# Patient Record
Sex: Male | Born: 1950
Health system: Southern US, Community
[De-identification: ages and names within clinical notes are randomized; demographics above are authoritative.]

## PROBLEM LIST (undated history)

## (undated) DIAGNOSIS — I509 Heart failure, unspecified: Secondary | ICD-10-CM

## (undated) DIAGNOSIS — G4730 Sleep apnea, unspecified: Secondary | ICD-10-CM

## (undated) DIAGNOSIS — Z955 Presence of coronary angioplasty implant and graft: Secondary | ICD-10-CM

## (undated) DIAGNOSIS — E7800 Pure hypercholesterolemia, unspecified: Secondary | ICD-10-CM

## (undated) DIAGNOSIS — R011 Cardiac murmur, unspecified: Secondary | ICD-10-CM

## (undated) DIAGNOSIS — B958 Unspecified staphylococcus as the cause of diseases classified elsewhere: Secondary | ICD-10-CM

## (undated) DIAGNOSIS — G629 Polyneuropathy, unspecified: Secondary | ICD-10-CM

## (undated) DIAGNOSIS — M1990 Unspecified osteoarthritis, unspecified site: Secondary | ICD-10-CM

## (undated) DIAGNOSIS — T8859XA Other complications of anesthesia, initial encounter: Secondary | ICD-10-CM

## (undated) DIAGNOSIS — I2510 Atherosclerotic heart disease of native coronary artery without angina pectoris: Secondary | ICD-10-CM

## (undated) DIAGNOSIS — H548 Legal blindness, as defined in USA: Secondary | ICD-10-CM

## (undated) DIAGNOSIS — M6530 Trigger finger, unspecified finger: Secondary | ICD-10-CM

## (undated) DIAGNOSIS — M545 Low back pain, unspecified: Secondary | ICD-10-CM

## (undated) DIAGNOSIS — I10 Essential (primary) hypertension: Secondary | ICD-10-CM

## (undated) DIAGNOSIS — Z973 Presence of spectacles and contact lenses: Secondary | ICD-10-CM

## (undated) DIAGNOSIS — IMO0002 Reserved for concepts with insufficient information to code with codable children: Secondary | ICD-10-CM

## (undated) DIAGNOSIS — T4145XA Adverse effect of unspecified anesthetic, initial encounter: Secondary | ICD-10-CM

## (undated) HISTORY — DX: Presence of coronary angioplasty implant and graft: Z95.5

## (undated) HISTORY — PX: EYE SURGERY: SHX253

## (undated) HISTORY — PX: LUMBAR FUSION: SHX111

## (undated) HISTORY — DX: Low back pain, unspecified: M54.50

## (undated) HISTORY — PX: NECK SURGERY: SHX720

## (undated) HISTORY — DX: Heart failure, unspecified: I50.9

## (undated) HISTORY — DX: Atherosclerotic heart disease of native coronary artery without angina pectoris: I25.10

## (undated) HISTORY — DX: Reserved for concepts with insufficient information to code with codable children: IMO0002

## (undated) HISTORY — DX: Essential (primary) hypertension: I10

## (undated) HISTORY — PX: INGUINAL HERNIA REPAIR: SUR1180

## (undated) HISTORY — DX: Low back pain: M54.5

## (undated) HISTORY — DX: Pure hypercholesterolemia, unspecified: E78.00

## (undated) HISTORY — PX: COLONOSCOPY: SHX174

## (undated) HISTORY — PX: OTHER SURGICAL HISTORY: SHX169

## (undated) HISTORY — DX: Polyneuropathy, unspecified: G62.9

---

## 1998-05-30 ENCOUNTER — Ambulatory Visit (HOSPITAL_COMMUNITY): Admission: RE | Admit: 1998-05-30 | Discharge: 1998-05-30 | Payer: Self-pay | Admitting: Family Medicine

## 2000-01-21 ENCOUNTER — Encounter: Payer: Self-pay | Admitting: *Deleted

## 2000-01-21 ENCOUNTER — Inpatient Hospital Stay (HOSPITAL_COMMUNITY): Admission: EM | Admit: 2000-01-21 | Discharge: 2000-01-23 | Payer: Self-pay | Admitting: Emergency Medicine

## 2000-01-27 ENCOUNTER — Encounter: Admission: RE | Admit: 2000-01-27 | Discharge: 2000-04-26 | Payer: Self-pay | Admitting: Internal Medicine

## 2000-08-13 ENCOUNTER — Ambulatory Visit (HOSPITAL_COMMUNITY): Admission: RE | Admit: 2000-08-13 | Discharge: 2000-08-13 | Payer: Self-pay | Admitting: *Deleted

## 2000-08-13 ENCOUNTER — Encounter (INDEPENDENT_AMBULATORY_CARE_PROVIDER_SITE_OTHER): Payer: Self-pay | Admitting: *Deleted

## 2001-04-02 ENCOUNTER — Encounter: Payer: Self-pay | Admitting: Internal Medicine

## 2001-04-02 ENCOUNTER — Ambulatory Visit (HOSPITAL_COMMUNITY): Admission: RE | Admit: 2001-04-02 | Discharge: 2001-04-02 | Payer: Self-pay | Admitting: Internal Medicine

## 2001-06-04 ENCOUNTER — Encounter: Admission: RE | Admit: 2001-06-04 | Discharge: 2001-06-04 | Payer: Self-pay | Admitting: Neurosurgery

## 2001-06-04 ENCOUNTER — Encounter: Payer: Self-pay | Admitting: Neurosurgery

## 2001-06-16 ENCOUNTER — Encounter: Payer: Self-pay | Admitting: Neurosurgery

## 2001-06-17 ENCOUNTER — Encounter: Payer: Self-pay | Admitting: Neurosurgery

## 2001-06-17 ENCOUNTER — Inpatient Hospital Stay (HOSPITAL_COMMUNITY): Admission: RE | Admit: 2001-06-17 | Discharge: 2001-06-18 | Payer: Self-pay | Admitting: Neurosurgery

## 2001-08-03 ENCOUNTER — Emergency Department (HOSPITAL_COMMUNITY): Admission: EM | Admit: 2001-08-03 | Discharge: 2001-08-03 | Payer: Self-pay | Admitting: Emergency Medicine

## 2001-08-06 ENCOUNTER — Encounter (INDEPENDENT_AMBULATORY_CARE_PROVIDER_SITE_OTHER): Payer: Self-pay | Admitting: *Deleted

## 2001-08-07 ENCOUNTER — Encounter: Payer: Self-pay | Admitting: Neurosurgery

## 2001-08-07 ENCOUNTER — Inpatient Hospital Stay (HOSPITAL_COMMUNITY): Admission: EM | Admit: 2001-08-07 | Discharge: 2001-08-19 | Payer: Self-pay | Admitting: Physical Therapy

## 2001-08-10 ENCOUNTER — Encounter: Payer: Self-pay | Admitting: Neurosurgery

## 2001-08-10 ENCOUNTER — Encounter: Payer: Self-pay | Admitting: Infectious Diseases

## 2001-08-11 ENCOUNTER — Encounter: Payer: Self-pay | Admitting: Neurosurgery

## 2001-08-13 ENCOUNTER — Encounter: Payer: Self-pay | Admitting: Infectious Diseases

## 2001-08-16 ENCOUNTER — Encounter: Payer: Self-pay | Admitting: Neurosurgery

## 2001-08-19 ENCOUNTER — Inpatient Hospital Stay (HOSPITAL_COMMUNITY)
Admission: RE | Admit: 2001-08-19 | Discharge: 2001-09-03 | Payer: Self-pay | Admitting: Physical Medicine & Rehabilitation

## 2001-09-02 ENCOUNTER — Encounter: Payer: Self-pay | Admitting: Physical Medicine & Rehabilitation

## 2001-09-22 ENCOUNTER — Encounter: Admission: RE | Admit: 2001-09-22 | Discharge: 2001-09-22 | Payer: Self-pay | Admitting: Infectious Diseases

## 2001-10-20 ENCOUNTER — Encounter: Admission: RE | Admit: 2001-10-20 | Discharge: 2001-10-20 | Payer: Self-pay | Admitting: Infectious Diseases

## 2001-10-21 ENCOUNTER — Encounter: Admission: RE | Admit: 2001-10-21 | Discharge: 2001-11-01 | Payer: Self-pay | Admitting: Internal Medicine

## 2001-11-10 ENCOUNTER — Encounter: Payer: Self-pay | Admitting: Internal Medicine

## 2001-11-10 ENCOUNTER — Inpatient Hospital Stay (HOSPITAL_COMMUNITY): Admission: AD | Admit: 2001-11-10 | Discharge: 2001-11-14 | Payer: Self-pay | Admitting: Internal Medicine

## 2001-12-31 ENCOUNTER — Ambulatory Visit (HOSPITAL_COMMUNITY): Admission: RE | Admit: 2001-12-31 | Discharge: 2001-12-31 | Payer: Self-pay | Admitting: Internal Medicine

## 2002-01-10 ENCOUNTER — Encounter: Admission: RE | Admit: 2002-01-10 | Discharge: 2002-02-14 | Payer: Self-pay | Admitting: Internal Medicine

## 2005-12-22 HISTORY — PX: OTHER SURGICAL HISTORY: SHX169

## 2006-05-29 ENCOUNTER — Inpatient Hospital Stay (HOSPITAL_COMMUNITY): Admission: EM | Admit: 2006-05-29 | Discharge: 2006-06-02 | Payer: Self-pay | Admitting: Emergency Medicine

## 2007-11-17 ENCOUNTER — Inpatient Hospital Stay (HOSPITAL_COMMUNITY): Admission: EM | Admit: 2007-11-17 | Discharge: 2007-11-20 | Payer: Self-pay | Admitting: Emergency Medicine

## 2008-12-19 ENCOUNTER — Ambulatory Visit: Payer: Self-pay | Admitting: Cardiology

## 2008-12-20 ENCOUNTER — Inpatient Hospital Stay (HOSPITAL_COMMUNITY): Admission: EM | Admit: 2008-12-20 | Discharge: 2008-12-21 | Payer: Self-pay | Admitting: Emergency Medicine

## 2009-03-18 ENCOUNTER — Emergency Department (HOSPITAL_COMMUNITY): Admission: EM | Admit: 2009-03-18 | Discharge: 2009-03-18 | Payer: Self-pay | Admitting: Emergency Medicine

## 2009-05-25 ENCOUNTER — Inpatient Hospital Stay (HOSPITAL_COMMUNITY): Admission: EM | Admit: 2009-05-25 | Discharge: 2009-05-26 | Payer: Self-pay | Admitting: Emergency Medicine

## 2010-06-27 LAB — HM DIABETES EYE EXAM: HM Diabetic Eye Exam: NORMAL

## 2010-08-17 ENCOUNTER — Emergency Department (HOSPITAL_COMMUNITY)
Admission: EM | Admit: 2010-08-17 | Discharge: 2010-08-17 | Payer: Self-pay | Source: Home / Self Care | Admitting: Emergency Medicine

## 2010-08-19 ENCOUNTER — Telehealth: Payer: Self-pay | Admitting: Internal Medicine

## 2010-08-28 ENCOUNTER — Ambulatory Visit: Payer: Self-pay | Admitting: Internal Medicine

## 2010-08-28 DIAGNOSIS — I509 Heart failure, unspecified: Secondary | ICD-10-CM | POA: Insufficient documentation

## 2010-08-28 DIAGNOSIS — M545 Low back pain, unspecified: Secondary | ICD-10-CM | POA: Insufficient documentation

## 2010-08-28 DIAGNOSIS — E118 Type 2 diabetes mellitus with unspecified complications: Secondary | ICD-10-CM

## 2010-08-28 DIAGNOSIS — I2510 Atherosclerotic heart disease of native coronary artery without angina pectoris: Secondary | ICD-10-CM | POA: Insufficient documentation

## 2010-08-28 DIAGNOSIS — E1165 Type 2 diabetes mellitus with hyperglycemia: Secondary | ICD-10-CM | POA: Insufficient documentation

## 2010-08-28 LAB — CONVERTED CEMR LAB
BUN: 15 mg/dL (ref 6–23)
Chloride: 105 meq/L (ref 96–112)
Cholesterol: 193 mg/dL (ref 0–200)
Creatinine, Ser: 1 mg/dL (ref 0.4–1.5)
Glucose, Bld: 108 mg/dL — ABNORMAL HIGH (ref 70–99)
HDL goal, serum: 40 mg/dL
HDL: 26.4 mg/dL — ABNORMAL LOW (ref >39.00)
LDL Goal: 70 mg/dL
Potassium: 5 meq/L (ref 3.5–5.1)

## 2010-08-29 ENCOUNTER — Encounter: Payer: Self-pay | Admitting: Internal Medicine

## 2010-08-29 ENCOUNTER — Telehealth: Payer: Self-pay | Admitting: Internal Medicine

## 2010-08-29 ENCOUNTER — Encounter (INDEPENDENT_AMBULATORY_CARE_PROVIDER_SITE_OTHER): Payer: Self-pay | Admitting: *Deleted

## 2010-09-09 ENCOUNTER — Encounter (INDEPENDENT_AMBULATORY_CARE_PROVIDER_SITE_OTHER): Payer: Self-pay | Admitting: *Deleted

## 2010-09-09 ENCOUNTER — Ambulatory Visit: Payer: Self-pay | Admitting: Gastroenterology

## 2010-09-09 ENCOUNTER — Telehealth: Payer: Self-pay | Admitting: Internal Medicine

## 2010-09-12 ENCOUNTER — Telehealth: Payer: Self-pay | Admitting: Internal Medicine

## 2010-09-20 ENCOUNTER — Encounter: Payer: Self-pay | Admitting: Internal Medicine

## 2010-10-21 ENCOUNTER — Encounter (INDEPENDENT_AMBULATORY_CARE_PROVIDER_SITE_OTHER): Payer: Self-pay | Admitting: *Deleted

## 2010-10-23 ENCOUNTER — Ambulatory Visit: Payer: Self-pay | Admitting: Gastroenterology

## 2010-10-24 ENCOUNTER — Encounter: Payer: Self-pay | Admitting: Internal Medicine

## 2010-10-28 ENCOUNTER — Ambulatory Visit: Payer: Self-pay | Admitting: Internal Medicine

## 2010-10-30 ENCOUNTER — Ambulatory Visit: Payer: Self-pay | Admitting: Gastroenterology

## 2010-10-30 LAB — HM COLONOSCOPY

## 2010-11-01 ENCOUNTER — Encounter: Payer: Self-pay | Admitting: Gastroenterology

## 2010-11-05 ENCOUNTER — Encounter: Payer: Self-pay | Admitting: Internal Medicine

## 2010-11-20 ENCOUNTER — Telehealth: Payer: Self-pay | Admitting: Internal Medicine

## 2010-11-20 ENCOUNTER — Ambulatory Visit: Payer: Self-pay | Admitting: Internal Medicine

## 2010-11-20 DIAGNOSIS — J019 Acute sinusitis, unspecified: Secondary | ICD-10-CM | POA: Insufficient documentation

## 2010-11-20 DIAGNOSIS — R252 Cramp and spasm: Secondary | ICD-10-CM | POA: Insufficient documentation

## 2010-11-20 DIAGNOSIS — E875 Hyperkalemia: Secondary | ICD-10-CM | POA: Insufficient documentation

## 2010-11-20 LAB — CONVERTED CEMR LAB
BUN: 16 mg/dL (ref 6–23)
CO2: 25 meq/L (ref 19–32)
Glucose, Bld: 223 mg/dL — ABNORMAL HIGH (ref 70–99)
Potassium: 3.9 meq/L (ref 3.5–5.1)
Sodium: 139 meq/L (ref 135–145)

## 2010-12-18 ENCOUNTER — Ambulatory Visit: Payer: Self-pay | Admitting: Internal Medicine

## 2010-12-18 DIAGNOSIS — I10 Essential (primary) hypertension: Secondary | ICD-10-CM | POA: Insufficient documentation

## 2011-01-09 ENCOUNTER — Emergency Department (HOSPITAL_COMMUNITY)
Admission: EM | Admit: 2011-01-09 | Discharge: 2011-01-09 | Payer: Self-pay | Source: Home / Self Care | Admitting: Emergency Medicine

## 2011-01-09 ENCOUNTER — Telehealth: Payer: Self-pay | Admitting: Internal Medicine

## 2011-01-13 LAB — POCT I-STAT, CHEM 8
BUN: 27 mg/dL — ABNORMAL HIGH (ref 6–23)
Calcium, Ion: 1.16 mmol/L (ref 1.12–1.32)
Chloride: 106 mEq/L (ref 96–112)
Creatinine, Ser: 1.2 mg/dL (ref 0.4–1.5)
Glucose, Bld: 119 mg/dL — ABNORMAL HIGH (ref 70–99)
HCT: 42 % (ref 39.0–52.0)
Potassium: 4.2 mEq/L (ref 3.5–5.1)

## 2011-01-13 LAB — URINALYSIS, ROUTINE W REFLEX MICROSCOPIC
Bilirubin Urine: NEGATIVE
Ketones, ur: 15 mg/dL — AB
Nitrite: NEGATIVE
Specific Gravity, Urine: 1.028 (ref 1.005–1.030)
Urine Glucose, Fasting: NEGATIVE mg/dL
pH: 6 (ref 5.0–8.0)

## 2011-01-21 NOTE — Letter (Signed)
Summary: [facility] Instructions  [facility]  [address] [place], [place] [address]   Phone: [phone]  Fax: [phone]       GASPER HOPES    16-Mar-1951    MRN: [id number]        Procedure Day Dorna Bloom:  Wednesday 10/30/2010     Arrival Time: 7:30 am      Procedure Time: 8:30 am    Location of Procedure:                    _x [facility] (4th Floor) _ _  [facility] ( Outpatient Registration) _ _  [facility] ( Short Stay on [facility])                       PREPARATION FOR COLONOSCOPY WITH MOVIPREP   Starting 5 days prior to your procedure Friday 11/4 do not eat nuts, seeds, popcorn, corn, beans, peas,  salads, or any raw vegetables.  Do not take any fiber supplements (e.g. Metamucil, Citrucel, and Benefiber).  THE DAY BEFORE YOUR PROCEDURE         DATE: Tuesday 11/8  1.  Drink clear liquids the entire day-NO SOLID FOOD  2.  Do not drink anything colored red or purple.  Avoid juices with pulp.  No orange juice.  3.  Drink at least 64 oz. (8 glasses) of fluid/clear liquids during the day to prevent dehydration and help the prep work efficiently.  CLEAR LIQUIDS INCLUDE: Water Jello Ice Popsicles Tea (sugar ok, no milk/cream) Powdered fruit flavored drinks Coffee (sugar ok, no milk/cream) Gatorade Juice: apple, white grape, white cranberry  Lemonade Clear bullion, consomm, broth Carbonated beverages (any kind) Strained chicken noodle soup Hard Candy                             4.  In the morning, mix first dose of MoviPrep solution:    Empty 1 Pouch A and 1 Pouch B into the disposable container    Add lukewarm drinking water to the top line of the container. Mix to dissolve    Refrigerate (mixed solution should be used within 24 hrs)  5.  Begin drinking the prep at 5:00 p.m. The MoviPrep container is divided by 4 marks.   Every 15 minutes drink the solution down to the next mark (approximately 8 oz) until the  full liter is complete.   6.  Follow completed prep with 16 oz of clear liquid of your choice (Nothing red or purple).  Continue to drink clear liquids until bedtime.  7.  Before going to bed, mix second dose of MoviPrep solution:    Empty 1 Pouch A and 1 Pouch B into the disposable container    Add lukewarm drinking water to the top line of the container. Mix to dissolve    Refrigerate  THE DAY OF YOUR PROCEDURE      DATE: Wednesday 11/9  Beginning at 3:30 a.m. (5 hours before procedure):         1. Every 15 minutes, drink the solution down to the next mark (approx 8 oz) until the full liter is complete.  2. Follow completed prep with 16 oz. of clear liquid of your choice.    3. You may drink clear liquids until 6:30 am (2 HOURS BEFORE PROCEDURE).   MEDICATION INSTRUCTIONS  Unless otherwise instructed, you should  take regular prescription medications with a small sip of water   as early as possible the morning of your procedure.  Diabetic patients - see separate instructions.   Additional medication instructions: Do not take Furosemide day of procedure.         OTHER INSTRUCTIONS  You will need a responsible adult at least 62 years of age to accompany you and drive you home.   This person must remain in the waiting room during your procedure.  Wear loose fitting clothing that is easily removed.  Leave jewelry and other valuables at home.  However, you may wish to bring a book to read or  an iPod/MP3 player to listen to music as you wait for your procedure to start.  Remove all body piercing jewelry and leave at home.  Total time from sign-in until discharge is approximately 2-3 hours.  You should go home directly after your procedure and rest.  You can resume normal activities the  day after your procedure.  The day of your procedure you should not:   Drive   Make legal decisions   Operate machinery   Drink alcohol   Return to work  You will receive  specific instructions about eating, activities and medications before you leave.    The above instructions have been reviewed and explained to me by   Ezra Sites RN  October 23, 2010 9:02 AM     I fully understand and can verbalize these instructions _____________________________ Date _________

## 2011-01-21 NOTE — Progress Notes (Signed)
     Follow-up for Phone Call       Follow-up by: Etta Grandchild MD,  August 29, 2010 7:35 AM    New/Updated Medications: LOVAZA 1 GM CAPS (OMEGA-3-ACID ETHYL ESTERS) 2 by mouth BID Prescriptions: LOVAZA 1 GM CAPS (OMEGA-3-ACID ETHYL ESTERS) 2 by mouth BID  #120 x 11   Entered and Authorized by:   Etta Grandchild MD   Signed by:   Etta Grandchild MD on 08/29/2010   Method used:   [facility] to        [facility]  [id number]       [address]       [place], [place]  [address]       Ph: [phone] or [phone]       Fax: [phone]   RxID:   [id number]

## 2011-01-21 NOTE — Miscellaneous (Signed)
Summary: [facility]  Clinical Lists Changes  Medications: Added new medication of MOVIPREP 100 GM  SOLR (PEG-KCL-NACL-NASULF-NA ASC-C) As per prep instructions. - Signed Rx of MOVIPREP 100 GM  SOLR (PEG-KCL-NACL-NASULF-NA ASC-C) As per prep instructions.;  #1 x 0;  Signed;  Entered by: Ezra Sites RN;  Authorized by: Rachael Fee MD;  Method used: [facility]  [address] [id number] [address] [place], [place]  [address], Ph: [phone] or [phone], Fax: [phone] Observations: Added new observation of NKA: T (10/23/2010 8:38)    Prescriptions: MOVIPREP 100 GM  SOLR (PEG-KCL-NACL-NASULF-NA ASC-C) As per prep instructions.  #1 x 0   Entered by:   Ezra Sites RN   Authorized by:   Rachael Fee MD   Signed by:   Ezra Sites RN on 10/23/2010   Method used:   [facility]        [facility]  [address] [id number]       [address]       [place], [place]  [address]       Ph: [phone] or [phone]       Fax: [phone]   RxID:   [id number]

## 2011-01-21 NOTE — Medication Information (Signed)
[facility]   Imported By: Lester Ashford 09/23/2010 09:11:03  _____________________________________________________________________  External Attachment:    Type:   Image     Comment:   External Document

## 2011-01-21 NOTE — Assessment & Plan Note (Signed)
Summary: leg pain/cd   Vital Signs:  Patient profile:   60 year old male Height:      69 inches Weight:      260 pounds BMI:     38.53 O2 Sat:      96 % on Room air Temp:     97.4 degrees F oral Pulse rate:   77 / minute Pulse rhythm:   regular Resp:     16 per minute BP sitting:   148 / 70  (left arm) Cuff size:   large  Vitals Entered By: Bill Salinas CMA (November 20, 2010 8:48 AM)  Nutrition Counseling: Patient's BMI is greater than 25 and therefore counseled on weight management options.  O2 Flow:  Room air CC: pt c/o sinus congestion since sept with mucous coming from nose (Manny Vitolo in color) with coughing/ ab, URI symptoms   Primary Care Provider:  Etta Grandchild MD  CC:  pt c/o sinus congestion since sept with mucous coming from nose (Enna Warwick in color) with coughing/ ab and URI symptoms.  History of Present Illness:  URI Symptoms      This is a 60 year old man who presents with URI symptoms.  The symptoms began 4 weeks ago.  The severity is described as moderate.  The patient reports nasal congestion, purulent nasal discharge, and productive cough, but denies sore throat, dry cough, earache, and sick contacts.  The patient denies fever, stiff neck, dyspnea, wheezing, rash, vomiting, diarrhea, use of an antipyretic, and response to antipyretic.  The patient denies itchy throat, sneezing, seasonal symptoms, headache, muscle aches, and severe fatigue.  Risk factors for Strep sinusitis include unilateral facial pain, unilateral nasal discharge, and double sickening.  The patient denies the following risk factors for Strep sinusitis: tooth pain, Strep exposure, tender adenopathy, and absence of cough.    Preventive Screening-Counseling & Management  Alcohol-Tobacco     Alcohol drinks/day: 0     Alcohol Counseling: not indicated; patient does not drink     Smoking Status: never     Tobacco Counseling: not indicated; no tobacco use  Hep-HIV-STD-Contraception     Hepatitis Risk:  no risk noted     HIV Risk: no risk noted     STD Risk: no risk noted      Sexual History:  currently monogamous.        Drug Use:  never.        Blood Transfusions:  no.    Clinical Review Panels:  Prevention   Last Colonoscopy:  DONE (10/30/2010)  Immunizations   Last Flu Vaccine:  Fluvax 3+ (10/28/2010)   Last Pneumovax:  Pneumovax (10/28/2010)  Lipid Management   Cholesterol:  193 (08/28/2010)   HDL (good cholesterol):  26.40 (08/28/2010)  Diabetes Management   HgBA1C:  8.8 (08/28/2010)   Creatinine:  1.0 (08/28/2010)   Last Dilated Eye Exam:  normal (06/27/2010)   Last Foot Exam:  yes (11/20/2010)   Last Flu Vaccine:  Fluvax 3+ (10/28/2010)   Last Pneumovax:  Pneumovax (10/28/2010)  Complete Metabolic Panel   Glucose:  108 (08/28/2010)   Sodium:  141 (08/28/2010)   Potassium:  5.0 (08/28/2010)   Chloride:  105 (08/28/2010)   CO2:  27 (08/28/2010)   BUN:  15 (08/28/2010)   Creatinine:  1.0 (08/28/2010)   Calcium:  9.8 (08/28/2010)   Medications Prior to Update: 1)  Cosopt 2-0.5 % Mg/ml Soln (Dorzolamide Hcl-Timolol Mal) .Marland Kitchen.. 1 -2 Drops in Both Eyes Twice A Day. 2)  Carvedilol 12.5 Mg Tabs (Carvedilol) .... Take 1 Tablet By Mouth Two Times A Day. 3)  Furosemide 40 Mg Tabs (Furosemide) .... Take 1 Tablet By Mouth Once A Day. 4)  Isosorbide Mononitrate Cr 60 Mg Xr24h-Tab (Isosorbide Mononitrate) .... Take 1 1/2 Tablets Once A Day. 5)  Metformin Hcl 1000 Mg Tabs (Metformin Hcl) .... Take 1 Tablet By Mouth Two Times A Day. 6)  Methazolamide 50 Mg Tabs (Methazolamide) .... Take 1 Tablet By Mouth Three Times A Day. 7)  Nitrostat  Mg Subl (Nitroglycerin) .... As Needed. 8)  Pravastatin Sodium 40 Mg Tabs (Pravastatin Sodium) .... Take 1 Tablet By Mouth Once A Day. 9)  Ranitidine Hcl 150 Mg Tabs (Ranitidine Hcl) .... Take 1 Tablet By Mouth Two Times A Day. 10)  Humalog Mix 75/25 75-25 % Susp (Insulin Lispro Prot & Lispro) .... 85 Units Every Morning and 45 Units Every  Evening. 11)  Klor-Con M20 20 Meq Cr-Tabs (Potassium Chloride Crys Cr) .... Take 1 Tablet By Mouth Once A Day. 12)  Cosopt 22.3-6.8 Mg/ml Soln (Dorzolamide Hcl-Timolol Mal) .Marland Kitchen.. 1 Drop in Right Eye Twice A Day. 13)  Lovaza 1 Gm Caps (Omega-3-Acid Ethyl Esters) .... 2 By Mouth Bid 14)  Insulin Syringe/needle 28g X 1/2" 1 Ml Misc (Insulin Syringe-Needle U-100) .... Use As Directed Up To Tid 15)  Moviprep 100 Gm  Solr (Peg-Kcl-Nacl-Nasulf-Na Asc-C) .... As Per Prep Instructions. 16)  Januvia 100 Mg Tabs (Sitagliptin Phosphate) .... One By Mouth Once Daily For Diabetes  Current Medications (verified): 1)  Cosopt 2-0.5 % Mg/ml Soln (Dorzolamide Hcl-Timolol Mal) .Marland Kitchen.. 1 -2 Drops in Both Eyes Twice A Day. 2)  Carvedilol 12.5 Mg Tabs (Carvedilol) .... Take 1 Tablet By Mouth Two Times A Day. 3)  Furosemide 40 Mg Tabs (Furosemide) .... Take 1 Tablet By Mouth Once A Day. 4)  Isosorbide Mononitrate Cr 60 Mg Xr24h-Tab (Isosorbide Mononitrate) .... Take 1 1/2 Tablets Once A Day. 5)  Methazolamide 50 Mg Tabs (Methazolamide) .... Take 1 Tablet By Mouth Three Times A Day. 6)  Nitrostat  Mg Subl (Nitroglycerin) .... As Needed. 7)  Pravastatin Sodium 40 Mg Tabs (Pravastatin Sodium) .... Take 1 Tablet By Mouth Once A Day. 8)  Ranitidine Hcl 150 Mg Tabs (Ranitidine Hcl) .... Take 1 Tablet By Mouth Two Times A Day. 9)  Humalog Mix 75/25 75-25 % Susp (Insulin Lispro Prot & Lispro) .... 85 Units Every Morning and 45 Units Every Evening. 10)  Cosopt 22.3-6.8 Mg/ml Soln (Dorzolamide Hcl-Timolol Mal) .Marland Kitchen.. 1 Drop in Right Eye Twice A Day. 11)  Lovaza 1 Gm Caps (Omega-3-Acid Ethyl Esters) .... 2 By Mouth Bid 12)  Insulin Syringe/needle 28g X 1/2" 1 Ml Misc (Insulin Syringe-Needle U-100) .... Use As Directed Up To Tid 13)  Moviprep 100 Gm  Solr (Peg-Kcl-Nacl-Nasulf-Na Asc-C) .... As Per Prep Instructions. 14)  Januvia 100 Mg Tabs (Sitagliptin Phosphate) .... One By Mouth Once Daily For Diabetes 15)  Ceftin 500 Mg Tab  (Cefuroxime Axetil) .... Take One (1) Tablet By Mouth Two (2) Times A Day X 10 Days 16)  Tussionex Pennkinetic Er 10-8 Mg/53ml Lqcr (Hydrocod Polst-Chlorphen Polst) .... 5 Ml By Mouth Two Times A Day As Needed For Cough  Allergies (verified): 1)  ! Metformin Hcl  Past History:  Past Medical History: Last updated: 09/09/2010 Glaucoma Ulcers--1970s HTN Hypercholesterolemia Diabetes mellitus, type II Congestive heart failure Coronary artery disease Low back pain elevated risk for colon cancer, family history  Past Surgical History: Last updated: 09/09/2010 Prosthetic  Cornea placement, right eye-- 2007 [facility] Lumbar fusion   Family History: Last updated: 09/09/2010 mother--breast cancer and also colon cancer  Social History: Last updated: 09/09/2010 Occupation: disabled, blind Married Never Smoked Alcohol use-no Regular exercise-no   Risk Factors: Alcohol Use: 0 (11/20/2010) Caffeine Use: yes (08/28/2010) Exercise: no (08/28/2010)  Risk Factors: Smoking Status: never (11/20/2010)  Family History: Reviewed history from 09/09/2010 and no changes required. mother--breast cancer and also colon cancer  Social History: Reviewed history from 09/09/2010 and no changes required. Occupation: disabled, blind Married Never Smoked Alcohol use-no Regular exercise-no   Review of Systems       The patient complains of weight gain.  The patient denies anorexia, fever, weight loss, chest pain, syncope, dyspnea on exertion, peripheral edema, headaches, hemoptysis, abdominal pain, suspicious skin lesions, abnormal bleeding, and enlarged lymph nodes.   MS:  Complains of muscle aches and cramps; denies joint pain, joint redness, joint swelling, loss of strength, low back pain, muscle weakness, and stiffness. Endo:  Denies cold intolerance, excessive hunger, excessive thirst, excessive urination, heat intolerance, polyuria, and weight change.  Physical Exam  General:   alert, well-developed, well-nourished, well-hydrated, and overweight-appearing.   Head:  normocephalic, atraumatic, no abnormalities observed, and no abnormalities palpated.   Eyes:  vision grossly intact and no injection.   Ears:  R ear normal and L ear normal.   Nose:  no external deformity, no nasal discharge, no mucosal pallor, no mucosal edema, no airflow obstruction, no intranasal foreign body, no mucosal friability, no active bleeding or clots, L maxillary sinus tenderness, and R maxillary sinus tenderness.   Mouth:  good dentition and pharynx pink and moist.   Neck:  supple, full ROM, no masses, no thyromegaly, no thyroid nodules or tenderness, no JVD, normal carotid upstroke, no carotid bruits, no cervical lymphadenopathy, and no neck tenderness.   Lungs:  normal respiratory effort, no intercostal retractions, no accessory muscle use, normal breath sounds, no dullness, no fremitus, no crackles, and no wheezes.   Heart:  normal rate, regular rhythm, no murmur, no gallop, no rub, and no JVD.   Abdomen:  soft, non-tender, normal bowel sounds, no distention, no masses, no guarding, no rigidity, no rebound tenderness, no abdominal hernia, no inguinal hernia, no hepatomegaly, and no splenomegaly.   Msk:  normal ROM, no joint tenderness, no joint swelling, no joint warmth, no redness over joints, no joint deformities, no joint instability, no crepitation, and no muscle atrophy.   Pulses:  R femoral decreased, R popliteal decreased, R posterior tibial decreased, L femoral decreased, L popliteal decreased, L posterior tibial decreased, and L dorsalis pedis decreased.   Extremities:  trace left pedal edema and trace right pedal edema.   Neurologic:  No cranial nerve deficits noted. Station and gait are normal. Plantar reflexes are down-going bilaterally. DTRs are symmetrical throughout. Sensory, motor and coordinative functions appear intact. Skin:  turgor normal, color normal, no rashes, no suspicious  lesions, no ecchymoses, no petechiae, no purpura, no ulcerations, and no edema.   Cervical Nodes:  no anterior cervical adenopathy and no posterior cervical adenopathy.   Axillary Nodes:  no R axillary adenopathy and no L axillary adenopathy.   Psych:  Cognition and judgment appear intact. Alert and cooperative with normal attention span and concentration. No apparent delusions, illusions, hallucinations  Diabetes Management Exam:    Foot Exam (with socks and/or shoes not present):       Sensory-Pinprick/Light touch:  Left medial foot (L-4): normal          Left dorsal foot (L-5): normal          Left lateral foot (S-1): normal          Right medial foot (L-4): normal          Right dorsal foot (L-5): normal          Right lateral foot (S-1): normal       Sensory-Monofilament:          Left foot: normal          Right foot: normal       Inspection:          Left foot: normal          Right foot: normal       Nails:          Left foot: normal          Right foot: normal   Impression & Recommendations:  Problem # 1:  HYPERKALEMIA (ICD-276.7) Assessment New  Orders: Venipuncture ([address]) TLB-BMP (Basic Metabolic Panel-BMET) (80048-METABOL) TLB-CK Total Only(Creatine Kinase/CPK) (82550-CK) TLB-A1C / Hgb A1C (Glycohemoglobin) (83036-A1C)  Problem # 2:  CRAMP IN LIMB (ICD-729.82) Assessment: New  Orders: Venipuncture ([address]) TLB-BMP (Basic Metabolic Panel-BMET) (80048-METABOL) TLB-CK Total Only(Creatine Kinase/CPK) (82550-CK) TLB-A1C / Hgb A1C (Glycohemoglobin) (83036-A1C)  Problem # 3:  DIABETES MELLITUS, TYPE II (ICD-250.00) Assessment: Deteriorated  The following medications were removed from the medication list:    Metformin Hcl 1000 Mg Tabs (Metformin hcl) .Marland Kitchen... Take 1 tablet by mouth two times a day. His updated medication list for this problem includes:    Humalog Mix 75/25 75-25 % Susp (Insulin lispro prot & lispro) .Marland KitchenMarland KitchenMarland KitchenMarland Kitchen 85 units every morning and 45 units  every evening.    Januvia 100 Mg Tabs (Sitagliptin phosphate) ..... One by mouth once daily for diabetes  Orders: Venipuncture ([address]) TLB-BMP (Basic Metabolic Panel-BMET) (80048-METABOL) TLB-CK Total Only(Creatine Kinase/CPK) (82550-CK) TLB-A1C / Hgb A1C (Glycohemoglobin) (83036-A1C)  Problem # 4:  SINUSITIS- ACUTE-NOS (ICD-461.9) Assessment: New  His updated medication list for this problem includes:    Ceftin 500 Mg Tab (Cefuroxime axetil) .Marland Kitchen... Take one (1) tablet by mouth two (2) times a day x 10 days    Tussionex Pennkinetic Er 10-8 Mg/3ml Lqcr (Hydrocod polst-chlorphen polst) .Marland KitchenMarland KitchenMarland KitchenMarland Kitchen 5 ml by mouth two times a day as needed for cough  Instructed on treatment. Call if symptoms persist or worsen.   Complete Medication List: 1)  Cosopt 2-0.5 % Mg/ml Soln (dorzolamide Hcl-timolol Mal)  .Marland Kitchen.. 1 -2 drops in both eyes twice a day. 2)  Carvedilol 12.5 Mg Tabs (Carvedilol) .... Take 1 tablet by mouth two times a day. 3)  Furosemide 40 Mg Tabs (Furosemide) .... Take 1 tablet by mouth once a day. 4)  Isosorbide Mononitrate Cr 60 Mg Xr24h-tab (Isosorbide mononitrate) .... Take 1 1/2 tablets once a day. 5)  Methazolamide 50 Mg Tabs (Methazolamide) .... Take 1 tablet by mouth three times a day. 6)  Nitrostat Mg Subl (nitroglycerin)  .... As needed. 7)  Pravastatin Sodium 40 Mg Tabs (Pravastatin sodium) .... Take 1 tablet by mouth once a day. 8)  Ranitidine Hcl 150 Mg Tabs (Ranitidine hcl) .... Take 1 tablet by mouth two times a day. 9)  Humalog Mix 75/25 75-25 % Susp (Insulin lispro prot & lispro) .... 85 units every morning and 45 units every evening. 10)  Cosopt 22.3-6.8 Mg/ml Soln (Dorzolamide hcl-timolol mal) .Marland Kitchen.. 1 drop in  right eye twice a day. 11)  Lovaza 1 Gm Caps (Omega-3-acid ethyl esters) .... 2 by mouth bid 12)  Insulin Syringe/needle 28g X 1/2" 1 Ml Misc (Insulin syringe-needle u-100) .... Use as directed up to tid 13)  Moviprep 100 Gm Solr (Peg-kcl-nacl-nasulf-na asc-c) .... As per  prep instructions. 14)  Januvia 100 Mg Tabs (Sitagliptin phosphate) .... One by mouth once daily for diabetes 15)  Ceftin 500 Mg Tab (Cefuroxime axetil) .... Take one (1) tablet by mouth two (2) times a day x 10 days 16)  Tussionex Pennkinetic Er 10-8 Mg/65ml Lqcr (Hydrocod polst-chlorphen polst) .... 5 ml by mouth two times a day as needed for cough  Patient Instructions: 1)  Please schedule a follow-up appointment in 1 month. 2)  It is important that you exercise regularly at least 20 minutes 5 times a week. If you develop chest pain, have severe difficulty breathing, or feel very tired , stop exercising immediately and seek medical attention. 3)  You need to lose weight. Consider a lower calorie diet and regular exercise.  4)  Check your blood sugars regularly. If your readings are usually above 200 or below 70 you should contact our office. 5)  It is important that your Diabetic A1c level is checked every 3 months. 6)  See your eye doctor yearly to check for diabetic eye damage. 7)  Check your feet each night for sore areas, calluses or signs of infection. 8)  Check your Blood Pressure regularly. If it is above 130/80: you should make an appointment. 9)  Take your antibiotic as prescribed until ALL of it is gone, but stop if you develop a rash or swelling and contact our office as soon as possible. 10)  Acute sinusitis symptoms for less than 10 days are not helped by antibiotics.Use warm moist compresses, and over the counter decongestants ( only as directed). Call if no improvement in 5-7 days, sooner if increasing pain, fever, or new symptoms. Prescriptions: TUSSIONEX PENNKINETIC ER 10-8 MG/5ML LQCR (HYDROCOD POLST-CHLORPHEN POLST) 5 ml by mouth two times a day as needed for cough  #4 ounces x 1   Entered and Authorized by:   Etta Grandchild MD   Signed by:   Etta Grandchild MD on 11/20/2010   Method used:   Print then Give to Patient   RxID:   [id number] CEFTIN 500 MG TAB  (CEFUROXIME AXETIL) Take one (1) tablet by mouth two (2) times a day X 10 days  #20 x 1   Entered and Authorized by:   Etta Grandchild MD   Signed by:   Etta Grandchild MD on 11/20/2010   Method used:   [facility]  [address] [id number]       [address]       [place], [place]  [address]       Ph: [phone] or [phone]       Fax: [phone]   RxID:   [id number]    Orders Added: 1)  Venipuncture [66063] 2)  TLB-BMP (Basic Metabolic Panel-BMET) [80048-METABOL] 3)  TLB-CK Total Only(Creatine Kinase/CPK) [82550-CK] 4)  TLB-A1C / Hgb A1C (Glycohemoglobin) [83036-A1C] 5)  Est. Patient Level IV [01601]

## 2011-01-21 NOTE — Progress Notes (Signed)
Summary: written rx/jones pt  Phone Note Call from Patient   Caller: Patient Summary of Call: Wife called  requesting written rx for diabetic shoes. She is requesting a call back to [phone]. Please advise on this [facility] pt Thanks Initial call taken by: Rock Nephew CMA,  September 12, 2010 2:03 PM  Follow-up for Phone Call        normally a request comes from the provider of the shoes with a form required for  medicare purposes  I would not feel comfortable writing the rx , as medicare rules are very strict on items of this type, and "proper" documentation is essential Follow-up by: Corwin Levins MD,  September 12, 2010 5:39 PM  Additional Follow-up for Phone Call Additional follow up Details #1::        Called and spoke to [name]. Advised usually form is sent from dme provider, but can hold info until MD returns on Monday. Please advise if ok to give written order for diabetic shoes. And if you have a preferred DME provider.    Additional Follow-up by: Alysia Penna,  September 13, 2010 11:43 AM    Additional Follow-up for Phone Call Additional follow up Details #2::    yes, ok for written order for diab shoes Follow-up by: Etta Grandchild MD,  September 15, 2010 6:27 PM  Additional Follow-up for Phone Call Additional follow up Details #3:: Details for Additional Follow-up Action Taken: Pt is aware. Orders are completed and being mailed to Pt. Additional Follow-up by: Alysia Penna,  September 16, 2010 2:10 PM

## 2011-01-21 NOTE — Progress Notes (Signed)
Summary: some chest pain/sob  Phone Note Call from Patient   Caller: Spouse phlynda Reason for Call: Talk to Nurse Complaint: Headache Summary of Call: pt's wife called to make a new pt appt-appt 9-27-pt now having chest pain, sob and some nausea like before he went to hosptial -pls advise [phone] Initial call taken by: Glynda Jaeger,  August 19, 2010 3:22 PM  Follow-up for Phone Call        N/A X1 Scherrie Bateman, LPN  August 19, 2010 3:29 PM busy x1 Scherrie Bateman, LPN  August 19, 2010 5:18 PM PER DR Graciela Husbands NEEDS APPT ON DOD SCHEDULE AND [facility] SAME DAY. Follow-up by: Scherrie Bateman, LPN,  August 19, 2010 6:20 PM  Additional Follow-up for Phone Call Additional follow up Details #1::        SPOKE WITH PT"S WIFE PT HAS APPT TOMMORROW WITH DR Caro Hight . Additional Follow-up by: Scherrie Bateman, LPN,  August 20, 2010 11:41 AM

## 2011-01-21 NOTE — Letter (Signed)
Summary: Previsit letter  [facility]  [address] [place], [place] [address]   Phone: [phone]  Fax: [phone]       09/09/2010 MRN: [id number]  [facility] [address] [place], [place]  [address]  Dear Mr. Coviello,  Welcome to the Gastroenterology Division at [facility].    You are scheduled to see a nurse for your pre-procedure visit on 10/23/10 at 830 am on the 3rd floor at [facility], [address]  We ask that you try to arrive at our office 15 minutes prior to your appointment time to allow for check-in.  Your nurse visit will consist of discussing your medical and surgical history, your immediate family medical history, and your medications.    Please bring a complete list of all your medications or, if you prefer, bring the medication bottles and we will list them.  We will need to be aware of both prescribed and over the counter drugs.  We will need to know exact dosage information as well.  If you are on blood thinners (Coumadin, Plavix, Aggrenox, Ticlid, etc.) please call our office today/prior to your appointment, as we need to consult with your physician about holding your medication.   Please be prepared to read and sign documents such as consent forms, a financial agreement, and acknowledgement forms.  If necessary, and with your consent, a friend or relative is welcome to sit-in on the nurse visit with you.  Please bring your insurance card so that we may make a copy of it.  If your insurance requires a referral to see a specialist, please bring your referral form from your primary care physician.  No co-pay is required for this nurse visit.     If you cannot keep your appointment, please call [phone] to cancel or reschedule prior to your appointment date.  This allows [place] the opportunity to schedule an appointment for another patient in need of care.    Thank you for choosing [facility] for your medical  needs.  We appreciate the opportunity to care for you.  Please visit [place] at our website  to learn more about our practice.                     Sincerely.                                                                                                                   The Gastroenterology Division  Appended Document: Previsit letter letter mailed

## 2011-01-21 NOTE — Letter (Signed)
Summary: [facility] [employer]/[facility]  [facility]   Imported By: Sherian Rein 11/08/2010 10:49:19  _____________________________________________________________________  External Attachment:    Type:   Image     Comment:   External Document

## 2011-01-21 NOTE — Letter (Signed)
Summary: Lipid Letter  [facility]  [address] [place], [place] [address]   Phone: [phone]  Fax: [phone]    08/29/2010  [facility] [address] [place], [place]  [address]  Dear Channing Mutters:  We have carefully reviewed your last lipid profile from  and the results are noted below with a summary of recommendations for lipid management.    Cholesterol:       193     Goal: <200   HDL "good" Cholesterol:   87.56     Goal: >40   LDL "bad" Cholesterol:   128     Goal: <70   Triglycerides:       454.0      Goal: <150 wow!        TLC Diet (Therapeutic Lifestyle Change): Saturated Fats & Transfatty acids should be kept < 7% of total calories ***Reduce Saturated Fats Polyunstaurated Fat can be up to 10% of total calories Monounsaturated Fat Fat can be up to 20% of total calories Total Fat should be no greater than 25-35% of total calories Carbohydrates should be 50-60% of total calories Protein should be approximately 15% of total calories Fiber should be at least 20-30 grams a day ***Increased fiber may help lower LDL Total Cholesterol should be < 200mg /day Consider adding plant stanol/sterols to diet (example: Benacol spread) ***A higher intake of unsaturated fat may reduce Triglycerides and Increase HDL    Adjunctive Measures (may lower LIPIDS and reduce risk of Heart Attack) include: Aerobic Exercise (20-30 minutes 3-4 times a week) Limit Alcohol Consumption Weight Reduction Aspirin 75-81 mg a day by mouth (if not allergic or contraindicated) Dietary Fiber 20-30 grams a day by mouth     Current Medications: 1)    Cosopt 2-0.5 % Mg/ml Soln (dorzolamide Hcl-timolol Mal)  .Marland Kitchen.. 1 -2 drops in both eyes twice a day. 2)    Carvedilol 12.5 Mg Tabs (Carvedilol) .... Take 1 tablet by mouth two times a day. 3)    Furosemide 40 Mg Tabs (Furosemide) .... Take 1 tablet by mouth once a day. 4)    Isosorbide Mononitrate Cr 60 Mg Xr24h-tab (Isosorbide mononitrate)  .... Take 1 1/2 tablets once a day. 5)    Metformin Hcl 1000 Mg Tabs (Metformin hcl) .... Take 1 tablet by mouth two times a day. 6)    Methazolamide 50 Mg Tabs (Methazolamide) .... Take 1 tablet by mouth three times a day. 7)    Nitrostat  Mg Subl (nitroglycerin)  .... As needed. 8)    Pravastatin Sodium 40 Mg Tabs (Pravastatin sodium) .... Take 1 tablet by mouth once a day. 9)    Ranitidine Hcl 150 Mg Tabs (Ranitidine hcl) .... Take 1 tablet by mouth two times a day. 10)    Humalog Mix 75/25 75-25 % Susp (Insulin lispro prot & lispro) .... 85 units every morning and 45 units every evening. 11)    Klor-con M20 20 Meq Cr-tabs (Potassium chloride crys cr) .... Take 1 tablet by mouth once a day. 12)    Cosopt 22.3-6.8 Mg/ml Soln (Dorzolamide hcl-timolol mal) .Marland Kitchen.. 1 drop in right eye twice a day.  If you have any questions, please call. We appreciate being able to work with you.   Sincerely,    [facility] Etta Grandchild MD  Appended Document: Lipid Letter Mailed.

## 2011-01-21 NOTE — Assessment & Plan Note (Signed)
Summary: yearly f/u / medicare/cd   Vital Signs:  Patient profile:   60 year old male Height:      69 inches Weight:      258 pounds BMI:     38.24 O2 Sat:      96 % on Room air Temp:     98.1 degrees F oral Pulse rate:   66 / minute Pulse rhythm:   regular Resp:     16 per minute BP sitting:   130 / 64  (left arm) Cuff size:   large  Vitals Entered By: Rock Nephew CMA (October 28, 2010 9:18 AM)  Nutrition Counseling: Patient's BMI is greater than 25 and therefore counseled on weight management options.  O2 Flow:  Room air CC: Patient here for yearly follow up/ would like to discuss insulin, URI symptoms Is Patient Diabetic? Yes Did you bring your meter with you today? No Pain Assessment Patient in pain? no       Does patient need assistance? Functional Status Self care Ambulation Normal   Primary Care Provider:  Etta Grandchild MD  CC:  Patient here for yearly follow up/ would like to discuss insulin and URI symptoms.  History of Present Illness:  URI Symptoms      This is a 60 year old man who presents with URI symptoms.  The symptoms began 3 weeks ago.  The severity is described as mild.  The patient reports nasal congestion, sore throat, productive cough, and sick contacts, but denies clear nasal discharge, purulent nasal discharge, dry cough, and earache.  The patient denies fever, stiff neck, dyspnea, wheezing, rash, vomiting, diarrhea, use of an antipyretic, and response to antipyretic.  The patient denies itchy throat, sneezing, headache, muscle aches, and severe fatigue.  The patient denies the following risk factors for Strep sinusitis: unilateral facial pain, unilateral nasal discharge, poor response to decongestant, double sickening, tooth pain, Strep exposure, tender adenopathy, and absence of cough.    Preventive Screening-Counseling & Management  Alcohol-Tobacco     Alcohol drinks/day: 0     Alcohol Counseling: not indicated; patient does not  drink     Smoking Status: never     Tobacco Counseling: not indicated; no tobacco use  Hep-HIV-STD-Contraception     Hepatitis Risk: no risk noted     HIV Risk: no risk noted     STD Risk: no risk noted      Sexual History:  currently monogamous.        Drug Use:  never.        Blood Transfusions:  no.    Clinical Review Panels:  Prevention   Last Colonoscopy:  Normal (10/06/2007)  Immunizations   Last Flu Vaccine:  Fluvax 3+ (10/28/2010)   Last Pneumovax:  Pneumovax (10/28/2010)  Lipid Management   Cholesterol:  193 (08/28/2010)   HDL (good cholesterol):  26.40 (08/28/2010)  Diabetes Management   HgBA1C:  8.8 (08/28/2010)   Creatinine:  1.0 (08/28/2010)   Last Dilated Eye Exam:  normal (06/27/2010)   Last Foot Exam:  yes (10/28/2010)   Last Flu Vaccine:  Fluvax 3+ (10/28/2010)   Last Pneumovax:  Pneumovax (10/28/2010)  Complete Metabolic Panel   Glucose:  108 (08/28/2010)   Sodium:  141 (08/28/2010)   Potassium:  5.0 (08/28/2010)   Chloride:  105 (08/28/2010)   CO2:  27 (08/28/2010)   BUN:  15 (08/28/2010)   Creatinine:  1.0 (08/28/2010)   Calcium:  9.8 (08/28/2010)   Medications Prior to  Update: 1)  Cosopt 2-0.5 % Mg/ml Soln (Dorzolamide Hcl-Timolol Mal) .Marland Kitchen.. 1 -2 Drops in Both Eyes Twice A Day. 2)  Carvedilol 12.5 Mg Tabs (Carvedilol) .... Take 1 Tablet By Mouth Two Times A Day. 3)  Furosemide 40 Mg Tabs (Furosemide) .... Take 1 Tablet By Mouth Once A Day. 4)  Isosorbide Mononitrate Cr 60 Mg Xr24h-Tab (Isosorbide Mononitrate) .... Take 1 1/2 Tablets Once A Day. 5)  Metformin Hcl 1000 Mg Tabs (Metformin Hcl) .... Take 1 Tablet By Mouth Two Times A Day. 6)  Methazolamide 50 Mg Tabs (Methazolamide) .... Take 1 Tablet By Mouth Three Times A Day. 7)  Nitrostat  Mg Subl (Nitroglycerin) .... As Needed. 8)  Pravastatin Sodium 40 Mg Tabs (Pravastatin Sodium) .... Take 1 Tablet By Mouth Once A Day. 9)  Ranitidine Hcl 150 Mg Tabs (Ranitidine Hcl) .... Take 1 Tablet By  Mouth Two Times A Day. 10)  Humalog Mix 75/25 75-25 % Susp (Insulin Lispro Prot & Lispro) .... 85 Units Every Morning and 45 Units Every Evening. 11)  Klor-Con M20 20 Meq Cr-Tabs (Potassium Chloride Crys Cr) .... Take 1 Tablet By Mouth Once A Day. 12)  Cosopt 22.3-6.8 Mg/ml Soln (Dorzolamide Hcl-Timolol Mal) .Marland Kitchen.. 1 Drop in Right Eye Twice A Day. 13)  Lovaza 1 Gm Caps (Omega-3-Acid Ethyl Esters) .... 2 By Mouth Bid 14)  Insulin Syringe/needle 28g X 1/2" 1 Ml Misc (Insulin Syringe-Needle U-100) .... Use As Directed Up To Tid 15)  Moviprep 100 Gm  Solr (Peg-Kcl-Nacl-Nasulf-Na Asc-C) .... As Per Prep Instructions.  Current Medications (verified): 1)  Cosopt 2-0.5 % Mg/ml Soln (Dorzolamide Hcl-Timolol Mal) .Marland Kitchen.. 1 -2 Drops in Both Eyes Twice A Day. 2)  Carvedilol 12.5 Mg Tabs (Carvedilol) .... Take 1 Tablet By Mouth Two Times A Day. 3)  Furosemide 40 Mg Tabs (Furosemide) .... Take 1 Tablet By Mouth Once A Day. 4)  Isosorbide Mononitrate Cr 60 Mg Xr24h-Tab (Isosorbide Mononitrate) .... Take 1 1/2 Tablets Once A Day. 5)  Metformin Hcl 1000 Mg Tabs (Metformin Hcl) .... Take 1 Tablet By Mouth Two Times A Day. 6)  Methazolamide 50 Mg Tabs (Methazolamide) .... Take 1 Tablet By Mouth Three Times A Day. 7)  Nitrostat  Mg Subl (Nitroglycerin) .... As Needed. 8)  Pravastatin Sodium 40 Mg Tabs (Pravastatin Sodium) .... Take 1 Tablet By Mouth Once A Day. 9)  Ranitidine Hcl 150 Mg Tabs (Ranitidine Hcl) .... Take 1 Tablet By Mouth Two Times A Day. 10)  Humalog Mix 75/25 75-25 % Susp (Insulin Lispro Prot & Lispro) .... 85 Units Every Morning and 45 Units Every Evening. 11)  Klor-Con M20 20 Meq Cr-Tabs (Potassium Chloride Crys Cr) .... Take 1 Tablet By Mouth Once A Day. 12)  Cosopt 22.3-6.8 Mg/ml Soln (Dorzolamide Hcl-Timolol Mal) .Marland Kitchen.. 1 Drop in Right Eye Twice A Day. 13)  Lovaza 1 Gm Caps (Omega-3-Acid Ethyl Esters) .... 2 By Mouth Bid 14)  Insulin Syringe/needle 28g X 1/2" 1 Ml Misc (Insulin Syringe-Needle U-100)  .... Use As Directed Up To Tid 15)  Moviprep 100 Gm  Solr (Peg-Kcl-Nacl-Nasulf-Na Asc-C) .... As Per Prep Instructions. 16)  Januvia 100 Mg Tabs (Sitagliptin Phosphate) .... One By Mouth Once Daily For Diabetes 17)  Avelox Abc Pack 400 Mg Tabs (Moxifloxacin Hcl) .... One By Mouth Once Daily For 5 Days  Allergies (verified): No Known Drug Allergies  Past History:  Past Medical History: Last updated: 09/09/2010 Glaucoma Ulcers--1970s HTN Hypercholesterolemia Diabetes mellitus, type II Congestive heart failure Coronary artery disease Low  back pain elevated risk for colon cancer, family history  Past Surgical History: Last updated: 09/09/2010 Prosthetic Cornea placement, right eye-- 2007 [facility] Lumbar fusion   Family History: Last updated: 09/09/2010 mother--breast cancer and also colon cancer  Social History: Last updated: 09/09/2010 Occupation: disabled, blind Married Never Smoked Alcohol use-no Regular exercise-no   Risk Factors: Alcohol Use: 0 (10/28/2010) Caffeine Use: yes (08/28/2010) Exercise: no (08/28/2010)  Risk Factors: Smoking Status: never (10/28/2010)  Family History: Reviewed history from 09/09/2010 and no changes required. mother--breast cancer and also colon cancer  Social History: Reviewed history from 09/09/2010 and no changes required. Occupation: disabled, blind Married Never Smoked Alcohol use-no Regular exercise-no   Review of Systems  The patient denies anorexia, fever, weight loss, weight gain, hoarseness, chest pain, syncope, dyspnea on exertion, peripheral edema, headaches, hemoptysis, abdominal pain, hematuria, suspicious skin lesions, enlarged lymph nodes, and angioedema.   CV:  Denies chest pain or discomfort, difficulty breathing at night, fainting, fatigue, leg cramps with exertion, lightheadness, near fainting, palpitations, shortness of breath with exertion, swelling of feet, and weight gain.  Physical  Exam  General:  alert, well-developed, well-nourished, well-hydrated, and overweight-appearing.   Head:  normocephalic, atraumatic, no abnormalities observed, and no abnormalities palpated.   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   Neck:  supple, full ROM, no masses, no thyromegaly, no thyroid nodules or tenderness, no JVD, normal carotid upstroke, no carotid bruits, no cervical lymphadenopathy, and no neck tenderness.   Lungs:  normal respiratory effort, no intercostal retractions, no accessory muscle use, normal breath sounds, no dullness, no fremitus, no crackles, and no wheezes.   Heart:  normal rate, regular rhythm, no murmur, no gallop, no rub, and no JVD.   Abdomen:  soft, non-tender, normal bowel sounds, no distention, no masses, no guarding, no rigidity, no rebound tenderness, no abdominal hernia, no inguinal hernia, no hepatomegaly, and no splenomegaly.   Msk:  normal ROM, no joint tenderness, no joint swelling, no joint warmth, no redness over joints, no joint deformities, no joint instability, no crepitation, and no muscle atrophy.   Pulses:  R femoral decreased, R popliteal decreased, R posterior tibial decreased, L femoral decreased, L popliteal decreased, L posterior tibial decreased, and L dorsalis pedis decreased.   Extremities:  trace left pedal edema and trace right pedal edema.   Neurologic:  No cranial nerve deficits noted. Station and gait are normal. Plantar reflexes are down-going bilaterally. DTRs are symmetrical throughout. Sensory, motor and coordinative functions appear intact. Skin:  turgor normal, color normal, no rashes, no suspicious lesions, no ecchymoses, no petechiae, no purpura, no ulcerations, and no edema.   Cervical Nodes:  no anterior cervical adenopathy and no posterior cervical adenopathy.   Axillary Nodes:  no R axillary adenopathy and no L axillary adenopathy.   Psych:  Cognition and  judgment appear intact. Alert and cooperative with normal attention span and concentration. No apparent delusions, illusions, hallucinations  Diabetes Management Exam:    Foot Exam (with socks and/or shoes not present):       Sensory-Pinprick/Light touch:          Left medial foot (L-4): normal          Left dorsal foot (L-5): normal          Left lateral foot (S-1): normal          Right medial foot (L-4): normal  Right dorsal foot (L-5): normal          Right lateral foot (S-1): normal       Sensory-Monofilament:          Left foot: normal          Right foot: normal       Inspection:          Left foot: normal          Right foot: normal       Nails:          Left foot: normal          Right foot: normal   Impression & Recommendations:  Problem # 1:  DIABETES MELLITUS, TYPE II (ICD-250.00) Assessment Deteriorated  His updated medication list for this problem includes:    Metformin Hcl 1000 Mg Tabs (Metformin hcl) .Marland Kitchen... Take 1 tablet by mouth two times a day.    Humalog Mix 75/25 75-25 % Susp (Insulin lispro prot & lispro) .Marland KitchenMarland KitchenMarland KitchenMarland Kitchen 85 units every morning and 45 units every evening.    Januvia 100 Mg Tabs (Sitagliptin phosphate) ..... One by mouth once daily for diabetes  Labs Reviewed: Creat: 1.0 (08/28/2010)     Last Eye Exam: normal (06/27/2010) Reviewed HgBA1c results: 8.8 (08/28/2010)  Problem # 2:  COUGH (ICD-786.2) Assessment: New will check for pna, edema, effusions, masses, etc Orders: T-2 View CXR (71020TC)  Problem # 3:  BRONCHITIS-ACUTE (ICD-466.0) Assessment: New  His updated medication list for this problem includes:    Avelox Abc Pack 400 Mg Tabs (Moxifloxacin hcl) ..... One by mouth once daily for 5 days  Take antibiotics and other medications as directed. Encouraged to push clear liquids, get enough rest, and take acetaminophen as needed. To be seen in 5-7 days if no improvement, sooner if worse.  Problem # 4:  CONGESTIVE HEART FAILURE  (ICD-428.0) Assessment: Unchanged  His updated medication list for this problem includes:    Carvedilol 12.5 Mg Tabs (Carvedilol) .Marland Kitchen... Take 1 tablet by mouth two times a day.    Furosemide 40 Mg Tabs (Furosemide) .Marland Kitchen... Take 1 tablet by mouth once a day.    Methazolamide 50 Mg Tabs (Methazolamide) .Marland Kitchen... Take 1 tablet by mouth three times a day.  Complete Medication List: 1)  Cosopt 2-0.5 % Mg/ml Soln (dorzolamide Hcl-timolol Mal)  .Marland Kitchen.. 1 -2 drops in both eyes twice a day. 2)  Carvedilol 12.5 Mg Tabs (Carvedilol) .... Take 1 tablet by mouth two times a day. 3)  Furosemide 40 Mg Tabs (Furosemide) .... Take 1 tablet by mouth once a day. 4)  Isosorbide Mononitrate Cr 60 Mg Xr24h-tab (Isosorbide mononitrate) .... Take 1 1/2 tablets once a day. 5)  Metformin Hcl 1000 Mg Tabs (Metformin hcl) .... Take 1 tablet by mouth two times a day. 6)  Methazolamide 50 Mg Tabs (Methazolamide) .... Take 1 tablet by mouth three times a day. 7)  Nitrostat Mg Subl (nitroglycerin)  .... As needed. 8)  Pravastatin Sodium 40 Mg Tabs (Pravastatin sodium) .... Take 1 tablet by mouth once a day. 9)  Ranitidine Hcl 150 Mg Tabs (Ranitidine hcl) .... Take 1 tablet by mouth two times a day. 10)  Humalog Mix 75/25 75-25 % Susp (Insulin lispro prot & lispro) .... 85 units every morning and 45 units every evening. 11)  Klor-con M20 20 Meq Cr-tabs (Potassium chloride crys cr) .... Take 1 tablet by mouth once a day. 12)  Cosopt 22.3-6.8 Mg/ml Soln (Dorzolamide hcl-timolol mal) .Marland Kitchen.. 1 drop  in right eye twice a day. 13)  Lovaza 1 Gm Caps (Omega-3-acid ethyl esters) .... 2 by mouth bid 14)  Insulin Syringe/needle 28g X 1/2" 1 Ml Misc (Insulin syringe-needle u-100) .... Use as directed up to tid 15)  Moviprep 100 Gm Solr (Peg-kcl-nacl-nasulf-na asc-c) .... As per prep instructions. 16)  Januvia 100 Mg Tabs (Sitagliptin phosphate) .... One by mouth once daily for diabetes 17)  Avelox Abc Pack 400 Mg Tabs (Moxifloxacin hcl) .... One by  mouth once daily for 5 days  Other Orders: Flu Vaccine 14yrs + MEDICARE PATIENTS (W1191) Administration Flu vaccine - MCR (G0008) Pneumococcal Vaccine (47829) Admin 1st Vaccine (56213)  Patient Instructions: 1)  Please schedule a follow-up appointment in 1 month. 2)  It is important that you exercise regularly at least 20 minutes 5 times a week. If you develop chest pain, have severe difficulty breathing, or feel very tired , stop exercising immediately and seek medical attention. 3)  You need to lose weight. Consider a lower calorie diet and regular exercise.  4)  Check your blood sugars regularly. If your readings are usually above 200 or below 70 you should contact our office. 5)  It is important that your Diabetic A1c level is checked every 3 months. 6)  See your eye doctor yearly to check for diabetic eye damage. 7)  Check your feet each night for sore areas, calluses or signs of infection. 8)  Check your Blood Pressure regularly. If it is above 130/80: you should make an appointment. 9)  Take your antibiotic as prescribed until ALL of it is gone, but stop if you develop a rash or swelling and contact our office as soon as possible. 10)  Acute bronchitis symptoms for less than 10 days are not helped by antibiotics. take over the counter cough medications. call if no improvment in  5-7 days, sooner if increasing cough, fever, or new symptoms( shortness of breath, chest pain). Prescriptions: AVELOX ABC PACK 400 MG TABS (MOXIFLOXACIN HCL) One by mouth once daily for 5 days  #5 x 0   Entered and Authorized by:   Etta Grandchild MD   Signed by:   Etta Grandchild MD on 10/28/2010   Method used:   Samples Given   RxID:   [id number] JANUVIA 100 MG TABS (SITAGLIPTIN PHOSPHATE) One by mouth once daily for diabetes  #210 x 0   Entered and Authorized by:   Etta Grandchild MD   Signed by:   Etta Grandchild MD on 10/28/2010   Method used:   Samples Given   RxID:    [id number]    Orders Added: 1)  Flu Vaccine 65yrs + MEDICARE PATIENTS [Q2039] 2)  Administration Flu vaccine - MCR [G0008] 3)  Pneumococcal Vaccine [90732] 4)  Admin 1st Vaccine [90471] 5)  T-2 View CXR [71020TC] 6)  Est. Patient Level IV [40347]   Immunizations Administered:  Pneumonia Vaccine:    Vaccine Type: Pneumovax    Site: right deltoid    Mfr: Merck    Dose: 0.5 ml    Route: IM    Given by: Rock Nephew CMA    Exp. Date: 04/17/2012    Lot #: [id number]    VIS given: 11/26/09 version given October 28, 2010.   Immunizations Administered:  Pneumonia Vaccine:    Vaccine Type: Pneumovax    Site: right deltoid    Mfr: Merck    Dose: 0.5 ml    Route: IM    Given  by: Rock Nephew CMA    Exp. Date: 04/17/2012    Lot #: [id number]    VIS given: 11/26/09 version given October 28, 2010. Marland [email or web address] Flu Vaccine Consent Questions     Do you have a history of severe allergic reactions to this vaccine? no    Any prior history of allergic reactions to egg and/or gelatin? no    Do you have a sensitivity to the preservative Thimersol? no    Do you have a past history of Guillan-Barre Syndrome? no    Do you currently have an acute febrile illness? no    Have you ever had a severe reaction to latex? no    Vaccine information given and explained to patient? yes    Are you currently pregnant? no    Lot Number:[id number]   Exp Date:06/21/2011   Site Given  Left Deltoid IM

## 2011-01-21 NOTE — Letter (Signed)
Summary: New Patient letter  [facility]  [address] [place], [place] [address]   Phone: [phone]  Fax: [phone]       08/29/2010 MRN: [id number]  [facility] [address] [place], [place]  [address]  Dear [name],  Welcome to the Gastroenterology Division at [facility].    You are scheduled to see Dr.  Rob Bunting on September 09, 2010 at 9:15am on the 3rd floor at [facility], [address]  We ask that you try to arrive at our office 15 minutes prior to your appointment time to allow for check-in.  We would like you to complete the enclosed self-administered evaluation form prior to your visit and bring it with you on the day of your appointment.  We will review it with you.  Also, please bring a complete list of all your medications or, if you prefer, bring the medication bottles and we will list them.  Please bring your insurance card so that we may make a copy of it.  If your insurance requires a referral to see a specialist, please bring your referral form from your primary care physician.  Co-payments are due at the time of your visit and may be paid by cash, check or credit card.     Your office visit will consist of a consult with your physician (includes a physical exam), any laboratory testing he/she may order, scheduling of any necessary diagnostic testing (e.g. x-ray, ultrasound, CT-scan), and scheduling of a procedure (e.g. Endoscopy, Colonoscopy) if required.  Please allow enough time on your schedule to allow for any/all of these possibilities.    If you cannot keep your appointment, please call [phone] to cancel or reschedule prior to your appointment date.  This allows [place] the opportunity to schedule an appointment for another patient in need of care.  If you do not cancel or reschedule by 5 p.m. the business day prior to your appointment date, you will be charged a $50.00 late cancellation/no-show fee.    Thank  you for choosing [facility] for your medical needs.  We appreciate the opportunity to care for you.  Please visit [place] at our website  to learn more about our practice.                     Sincerely,                                                             The Gastroenterology Division

## 2011-01-21 NOTE — Assessment & Plan Note (Signed)
History of Present Illness Visit Type: Initial Consult Primary GI MD: Rob Bunting MD Primary Provider: Etta Grandchild MD Requesting Provider: Etta Grandchild MD Chief Complaint: Consult Colonoscopy, pt is blind and has assistence with him History of Present Illness:     very pleasant 60 year old man who is blind, here with his wife. He has had previous colonoscopies.  He thinks he had one in the past 4-5 years.  No polyps were noted.  He does not recall who did the procedure, exactly when or where.   I found a colonoscopy report from Dr. Danise Edge dated 2001.  D.: Was completely normal and he was recommended to have a repeat colonoscopy in 5 years do to family history of colon cancer. He believes he did have another colonoscopy but isn't sure exactly who did, when it was done, where was done.  unexplained 20-30 pound weight loss in the past year.  No changes in bowel habits.             Current Medications (verified): 1)  Cosopt 2-0.5 % Mg/ml Soln (Dorzolamide Hcl-Timolol Mal) .Marland Kitchen.. 1 -2 Drops in Both Eyes Twice A Day. 2)  Carvedilol 12.5 Mg Tabs (Carvedilol) .... Take 1 Tablet By Mouth Two Times A Day. 3)  Furosemide 40 Mg Tabs (Furosemide) .... Take 1 Tablet By Mouth Once A Day. 4)  Isosorbide Mononitrate Cr 60 Mg Xr24h-Tab (Isosorbide Mononitrate) .... Take 1 1/2 Tablets Once A Day. 5)  Metformin Hcl 1000 Mg Tabs (Metformin Hcl) .... Take 1 Tablet By Mouth Two Times A Day. 6)  Methazolamide 50 Mg Tabs (Methazolamide) .... Take 1 Tablet By Mouth Three Times A Day. 7)  Nitrostat  Mg Subl (Nitroglycerin) .... As Needed. 8)  Pravastatin Sodium 40 Mg Tabs (Pravastatin Sodium) .... Take 1 Tablet By Mouth Once A Day. 9)  Ranitidine Hcl 150 Mg Tabs (Ranitidine Hcl) .... Take 1 Tablet By Mouth Two Times A Day. 10)  Humalog Mix 75/25 75-25 % Susp (Insulin Lispro Prot & Lispro) .... 85 Units Every Morning and 45 Units Every Evening. 11)  Klor-Con M20 20 Meq Cr-Tabs (Potassium  Chloride Crys Cr) .... Take 1 Tablet By Mouth Once A Day. 12)  Cosopt 22.3-6.8 Mg/ml Soln (Dorzolamide Hcl-Timolol Mal) .Marland Kitchen.. 1 Drop in Right Eye Twice A Day. 13)  Lovaza 1 Gm Caps (Omega-3-Acid Ethyl Esters) .... 2 By Mouth Bid  Allergies (verified): No Known Drug Allergies  Past History:  Past Medical History: Glaucoma Ulcers--1970s HTN Hypercholesterolemia Diabetes mellitus, type II Congestive heart failure Coronary artery disease Low back pain elevated risk for colon cancer, family history  Past Surgical History: Prosthetic Cornea placement, right eye-- 2007 [facility] Lumbar fusion   Family History: mother--breast cancer and also colon cancer  Social History: Occupation: disabled, blind Married Never Smoked Alcohol use-no Regular exercise-no   Review of Systems       Pertinent positive and negative review of systems were noted in the above HPI and GI specific review of systems.  All other review of systems was otherwise negative.   Vital Signs:  Patient profile:   60 year old male Height:      69 inches Weight:      262 pounds BMI:     38.83 BSA:     2.32 Pulse rate:   78 / minute Pulse rhythm:   regular BP sitting:   142 / 78  (left arm)  Vitals Entered By: Merri Ray CMA Duncan Dull) (September 09, 2010 9:26 AM)  Physical Exam  Additional Exam:  Constitutional: Blind, obese Psychiatric: alert and oriented times 3 Eyes:glaucomatous Mouth: oropharynx moist, no lesions Neck: supple, no lymphadenopathy Cardiovascular: heart regular rate and rythm Lungs: CTA bilaterally Abdomen: soft, non-tender, non-distended, no obvious ascites, no peritoneal signs, normal bowel sounds Extremities: no lower extremity edema bilaterally Skin: no lesions on visible extremities    Impression & Recommendations:  Problem # 1:  elevated risk for colon cancer, family history we will try to track down his most recent colonoscopy which they believe was 4-5 years ago.  He definitely had one in 2001 by Dr. Laural Benes we will start Dr. Henriette Combs office. He would certainly be due for repeat colonoscopy around now if he has not had a colonoscopy in 10 years. If we cannot find proof of when his most recent colonoscopy was, he believes it was4- 5 years ago, then we will schedule him for one now.  Patient Instructions: 1)  We will contact Dr. Daphine Deutscher Johnson's office for colonoscopy reports (2001 and ??5 years ago). 2)  Based on that report, will schedule you for your next colonoscopy. 3)  The medication list was reviewed and reconciled.  All changed / newly prescribed medications were explained.  A complete medication list was provided to the patient / caregiver.  Appended Document:  Dr Christella Hartigan Dr Martin's office only has the 2001 colon in their records.  Appended Document:  ok, lets schedule him for colonoscopy now then.  [facility].  Appended Document:  left message on machine to call back   Appended Document:  pt scheduled for previsit and colon

## 2011-01-21 NOTE — Letter (Signed)
Summary: CMN for Diabetic Shoes / [facility]  CMN for [facility]abetic [facility] / [facility]   Imported By: Lennie Odor 10/30/2010 10:29:51  _____________________________________________________________________  External Attachment:    Type:   Image     Comment:   External Document

## 2011-01-21 NOTE — Letter (Signed)
Summary: Diabetic Instructions  [facility]  [address] [place], [place] [address]   Phone: [phone]  Fax: [phone]    [name] [birth year] MRN: [id number]   Metformin   ORAL DIABETIC MEDICATION INSTRUCTIONS  The day before your procedure:   Take your diabetic pill as you do normally  The day of your procedure:   Do not take your diabetic pill    We will check your blood sugar levels during the admission process and again in Recovery before discharging you home  ________________________________________________________________________     INSULIN (LONG ACTING) MEDICATION INSTRUCTIONS (Lantus, NPH, 70/30, Humulin, Novolin-N)   The day before your procedure:   Take  your regular evening dose    The day of your procedure:   Do not take your morning dose

## 2011-01-21 NOTE — Letter (Signed)
Summary: Results Follow-up Letter  [facility]  [address] [place], [place] [address]   Phone: [phone]  Fax: [phone]    08/29/2010  [address] [place], [place]  [address]  Dear Mr. Pellegrini,   The following are the results of your recent test(s):  Test     Result     Potassium     slightly high Kidney     normal Blood sugars   too high  _________________________________________________________  Please call for an appointment soon, I sent a prescription to your pharmacy to lower your triglyceride level _________________________________________________________ _________________________________________________________ _________________________________________________________  Sincerely,  Sanda Linger MD [facility]           Appended Document: Results Follow-up Letter Mailed.

## 2011-01-21 NOTE — Letter (Signed)
Summary: Results Follow-up Letter  [facility]  [address] [place], [place] [address]   Phone: [phone]  Fax: [phone]    11/20/2010  [address] [place], [place]  [address]  Dear Mr. Iyengar,   The following are the results of your recent test(s):  Test     Result     Blood sugars   way too high Kidney/lytes   normal   _________________________________________________________  Please call for an appointment as directed _________________________________________________________ _________________________________________________________ _________________________________________________________  Sincerely,  Sanda Linger MD [facility]

## 2011-01-21 NOTE — Procedures (Signed)
Summary: Colonoscopy  Patient: [name] Note: All result statuses are Final unless otherwise noted.  Tests: (1) Colonoscopy (COL)   COL Colonoscopy           DONE     [facility]     [address]     [place], [place]  [address]           COLONOSCOPY PROCEDURE REPORT           PATIENT:  [name], [name]  MR#:  [id number]     BIRTHDATE:  Aug 10, 1951, 59 yrs. old  GENDER:  male     ENDOSCOPIST:  Rachael Fee, MD     REF. BY:  Etta Grandchild, M.D.     PROCEDURE DATE:  10/30/2010     PROCEDURE:  Colonoscopy with snare polypectomy     ASA CLASS:  Class II     INDICATIONS:  Elevated Risk Screening, mother had colon cancer     MEDICATIONS:   Fentanyl 100 mcg IV, Versed 10 mg IV           DESCRIPTION OF PROCEDURE:   After the risks benefits and     alternatives of the procedure were thoroughly explained, informed     consent was obtained.  Digital rectal exam was performed and     revealed no rectal masses.   The [id number] endoscope was     introduced through the anus and advanced to the cecum, which was     identified by both the appendix and ileocecal valve, without     limitations.  The quality of the prep was good, using MoviPrep.     The instrument was then slowly withdrawn as the colon was fully     examined.     <<PROCEDUREIMAGES>>           FINDINGS:  There were two small sessile polyps, both were removed     with cold snare. Both were retrieved and sent to pathology (jar     1). These ranged in size from 2mm to 3mm and were located in cecum     and ascending segments.  Mild diverticulosis was found in the     sigmoid to descending colon segments (see image1).  This was     otherwise a normal examination of the colon (see image2, image3,     and image4).   Retroflexed views in the rectum revealed no     abnormalities.    The scope was then withdrawn from the patient     and the procedure completed.           COMPLICATIONS:  None     ENDOSCOPIC  IMPRESSION:     1) Two small polyps, both removed and both sent to pathology     2) Mild diverticulosis in the sigmoid to descending colon     segments     3) Otherwise normal examination           RECOMMENDATIONS:     1) Given your significant family history of colon cancer, you     should have a repeat colonoscopy in 5 years even if the polyps     removed today are NOT precancerous.     2) You will receive a letter within 1-2 weeks with the results     of your biopsy as well as final recommendations. Please call my     office if you have not received a letter after 3 weeks.  ______________________________     Rachael Fee, MD           n.     eSIGNED:   Rachael Fee at 10/30/2010 08:54 AM           [place], [place], [phone]  Note: An exclamation mark (!) indicates a result that was not dispersed into the flowsheet. Document Creation Date: 10/30/2010 8:54 AM _______________________________________________________________________  (1) Order result status: Final Collection or observation date-time: 10/30/2010 08:50 Requested date-time:  Receipt date-time:  Reported date-time:  Referring Physician:   Ordering Physician: Rob Bunting [phone]) Specimen Source:  Source: Launa Grill Order Number: [id number] Lab site:   Appended Document: Colonoscopy     Procedures Next Due Date:    Colonoscopy: 10/2015

## 2011-01-21 NOTE — Progress Notes (Signed)
Summary: Cough med  Phone Note Call from Patient   Summary of Call: Patient is requesting alt rx cough med, insurance does not cover med prescribed.  Initial call taken by: Lamar Sprinkles, CMA,  November 20, 2010 4:25 PM  Follow-up for Phone Call        his insurance does not cover any cough meds Follow-up by: Etta Grandchild MD,  November 20, 2010 4:27 PM  Additional Follow-up for Phone Call Additional follow up Details #1::        Promethazine DM & benzonate are both on 4$ list, are either of these an option?  Additional Follow-up by: Lamar Sprinkles, CMA,  November 20, 2010 4:41 PM    Additional Follow-up for Phone Call Additional follow up Details #2::    Pt informed  Follow-up by: Lamar Sprinkles, CMA,  November 20, 2010 4:51 PM  New/Updated Medications: PROMETHAZINE-DM 6.25-15 MG/5ML SYRP (PROMETHAZINE-DM) 5-10 ml by mouth QID as needed for cough Prescriptions: PROMETHAZINE-DM 6.25-15 MG/5ML SYRP (PROMETHAZINE-DM) 5-10 ml by mouth QID as needed for cough  #8 ounces x 0   Entered by:   Lamar Sprinkles, CMA   Authorized by:   Etta Grandchild MD   Signed by:   Lamar Sprinkles, CMA on 11/20/2010   Method used:   Electronically to        [facility] (retail)       [address]       [place], [place]  [address]       Ph: [phone]       Fax: [phone]   RxID:   [id number] PROMETHAZINE-DM 6.25-15 MG/5ML SYRP (PROMETHAZINE-DM) 5-10 ml by mouth QID as needed for cough  #8 ounces x 0   Entered and Authorized by:   Etta Grandchild MD   Signed by:   Etta Grandchild MD on 11/20/2010   Method used:   Electronically to        [facility]  [id number] (retail)       [address]       [place], [place]  [address]       Ph: [phone] or [phone]       Fax: [phone]   RxID:   [id number]

## 2011-01-21 NOTE — Assessment & Plan Note (Signed)
Summary: NEW MEDICARE PT--PKG---#---STC--Rm 8   Vital Signs:  Patient profile:   60 year old male Height:      69 inches Weight:      260.50 pounds BMI:     38.61 O2 Sat:      95 % on Room air Temp:     98.4 degrees F oral Pulse rate:   79 / minute Pulse rhythm:   regular Resp:     18 per minute BP sitting:   146 / 80  (left arm) Cuff size:   large  Vitals Entered By: Mervin Kung CMA Duncan Dull) (August 28, 2010 4:07 PM)  Nutrition Counseling: Patient's BMI is greater than 25 and therefore counseled on weight management options.  O2 Flow:  Room air CC: rM 8  New pt, wants physical., Preventive Care, Lipid Management Is Patient Diabetic? Yes   Primary Care Provider:  Etta Grandchild MD  CC:  rM 8  New pt, wants physical., Preventive Care, and Lipid Management.  History of Present Illness: new to me this gentleman needs a f/up from a recent admission for hypokalemia.  Lipid Management History:      Positive NCEP/ATP III risk factors include male age 64 years old or older, diabetes, and ASHD (either angina/prior MI/prior CABG).  Negative NCEP/ATP III risk factors include no family history for ischemic heart disease, non-tobacco-user status, non-hypertensive, no prior stroke/TIA, no peripheral vascular disease, and no history of aortic aneurysm.        The patient states that he knows about the "Therapeutic Lifestyle Change" diet.  His compliance with the TLC diet is poor.  The patient expresses understanding of adjunctive measures for cholesterol lowering.  Adjunctive measures started by the patient include limit alcohol consumpton.  He expresses no side effects from his lipid-lowering medication.  The patient denies any symptoms to suggest myopathy or liver disease.     Preventive Screening-Counseling & Management  Alcohol-Tobacco     Alcohol drinks/day: 0     Smoking Status: never     Tobacco Counseling: not indicated; no tobacco use  Caffeine-Diet-Exercise  Caffeine use/day: yes     Does Patient Exercise: no  Hep-HIV-STD-Contraception     Hepatitis Risk: no risk noted     HIV Risk: no risk noted     STD Risk: no risk noted      Sexual History:  currently monogamous.        Drug Use:  never.        Blood Transfusions:  no.    Medications Prior to Update: 1)  None  Current Medications (verified): 1)  Cosopt 2-0.5 % Mg/ml Soln (Dorzolamide Hcl-Timolol Mal) .Marland Kitchen.. 1 -2 Drops in Both Eyes Twice A Day. 2)  Carvedilol 12.5 Mg Tabs (Carvedilol) .... Take 1 Tablet By Mouth Two Times A Day. 3)  Furosemide 40 Mg Tabs (Furosemide) .... Take 1 Tablet By Mouth Once A Day. 4)  Isosorbide Mononitrate Cr 60 Mg Xr24h-Tab (Isosorbide Mononitrate) .... Take 1 1/2 Tablets Once A Day. 5)  Metformin Hcl 1000 Mg Tabs (Metformin Hcl) .... Take 1 Tablet By Mouth Two Times A Day. 6)  Methazolamide 50 Mg Tabs (Methazolamide) .... Take 1 Tablet By Mouth Three Times A Day. 7)  Nitrostat  Mg Subl (Nitroglycerin) .... As Needed. 8)  Pravastatin Sodium 40 Mg Tabs (Pravastatin Sodium) .... Take 1 Tablet By Mouth Once A Day. 9)  Ranitidine Hcl 150 Mg Tabs (Ranitidine Hcl) .... Take 1 Tablet By Mouth Two Times A  Day. 10)  Humalog Mix 75/25 75-25 % Susp (Insulin Lispro Prot & Lispro) .... 85 Units Every Morning and 45 Units Every Evening. 11)  Klor-Con M20 20 Meq Cr-Tabs (Potassium Chloride Crys Cr) .... Take 1 Tablet By Mouth Once A Day. 12)  Cosopt 22.3-6.8 Mg/ml Soln (Dorzolamide Hcl-Timolol Mal) .Marland Kitchen.. 1 Drop in Right Eye Twice A Day.  Allergies (verified): No Known Drug Allergies  Past History:  Family History: Last updated: 08/28/2010 mother--breast cancer  Social History: Last updated: 08/28/2010 Occupation: disabled, blind Married Never Smoked Alcohol use-no Regular exercise-no  Risk Factors: Alcohol Use: 0 (08/28/2010) Caffeine Use: yes (08/28/2010) Exercise: no (08/28/2010)  Risk Factors: Smoking Status: never (08/28/2010)  Past Medical  History: Glaucoma Ulcers--1970s HTN Hypercholesterolemia Diabetes mellitus, type II Congestive heart failure Coronary artery disease Low back pain  Past Surgical History: Prosthetic Cornea placement, right eye-- 2007 [facility] Lumbar fusion  Family History: Reviewed history and no changes required. mother--breast cancer  Social History: Reviewed history and no changes required. Occupation: disabled, blind Married Never Smoked Alcohol use-no Regular exercise-no Smoking Status:  never Caffeine use/day:  yes Does Patient Exercise:  no Hepatitis Risk:  no risk noted HIV Risk:  no risk noted STD Risk:  no risk noted Sexual History:  currently monogamous Drug Use:  never Blood Transfusions:  no  Review of Systems       The patient complains of weight gain.  The patient denies anorexia, fever, weight loss, chest pain, syncope, dyspnea on exertion, peripheral edema, prolonged cough, headaches, hemoptysis, abdominal pain, hematuria, difficulty walking, and depression.   MS:  Complains of cramps; denies joint pain, joint redness, joint swelling, low back pain, mid back pain, muscle aches, muscle weakness, and stiffness. Endo:  Denies cold intolerance, excessive hunger, excessive thirst, excessive urination, heat intolerance, polyuria, and weight change.  Physical Exam  General:  alert, well-developed, well-nourished, well-hydrated, and overweight-appearing.   Head:  normocephalic, atraumatic, no abnormalities observed, and no abnormalities palpated.   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   Neck:  supple, full ROM, no masses, no thyromegaly, no thyroid nodules or tenderness, no JVD, normal carotid upstroke, no carotid bruits, no cervical lymphadenopathy, and no neck tenderness.   Lungs:  normal respiratory effort, no intercostal retractions, no accessory muscle use, normal breath sounds, no dullness, no  fremitus, no crackles, and no wheezes.   Heart:  normal rate, regular rhythm, no murmur, no gallop, no rub, and no JVD.   Abdomen:  soft, non-tender, normal bowel sounds, no distention, no masses, no guarding, no rigidity, no rebound tenderness, no abdominal hernia, no inguinal hernia, no hepatomegaly, and no splenomegaly.   Msk:  normal ROM, no joint tenderness, no joint swelling, no joint warmth, no redness over joints, no joint deformities, no joint instability, no crepitation, and no muscle atrophy.   Pulses:  R femoral decreased, R popliteal decreased, R posterior tibial decreased, L femoral decreased, L popliteal decreased, L posterior tibial decreased, and L dorsalis pedis decreased.   Extremities:  trace left pedal edema and trace right pedal edema.   Neurologic:  No cranial nerve deficits noted. Station and gait are normal. Plantar reflexes are down-going bilaterally. DTRs are symmetrical throughout. Sensory, motor and coordinative functions appear intact. Skin:  turgor normal, color normal, no rashes, no suspicious lesions, no ecchymoses, no petechiae, no purpura, no ulcerations, and no edema.   Cervical Nodes:  no anterior cervical adenopathy  and no posterior cervical adenopathy.   Axillary Nodes:  no R axillary adenopathy and no L axillary adenopathy.   Inguinal Nodes:  no R inguinal adenopathy and no L inguinal adenopathy.   Psych:  Cognition and judgment appear intact. Alert and cooperative with normal attention span and concentration. No apparent delusions, illusions, hallucinations  Diabetes Management Exam:    Foot Exam (with socks and/or shoes not present):       Sensory-Pinprick/Light touch:          Left medial foot (L-4): normal          Left dorsal foot (L-5): normal          Left lateral foot (S-1): normal          Right medial foot (L-4): normal          Right dorsal foot (L-5): normal          Right lateral foot (S-1): normal       Sensory-Monofilament:          Left  foot: normal          Right foot: normal       Inspection:          Left foot: normal          Right foot: normal       Nails:          Left foot: too long          Right foot: too long    Eye Exam:       Eye Exam done elsewhere          Date: 06/27/2010          Results: normal          Done by: [facility]   Impression & Recommendations:  Problem # 1:  DIABETES MELLITUS, TYPE II (ICD-250.00) Assessment Unchanged  His updated medication list for this problem includes:    Metformin Hcl 1000 Mg Tabs (Metformin hcl) .Marland Kitchen... Take 1 tablet by mouth two times a day.    Humalog Mix 75/25 75-25 % Susp (Insulin lispro prot & lispro) .Marland KitchenMarland KitchenMarland KitchenMarland Kitchen 85 units every morning and 45 units every evening.  Orders: Venipuncture (04540) TLB-Lipid Panel (80061-LIPID) TLB-BMP (Basic Metabolic Panel-BMET) (80048-METABOL) TLB-Magnesium (Mg) (83735-MG) TLB-A1C / Hgb A1C (Glycohemoglobin) (83036-A1C)  Problem # 2:  CONGESTIVE HEART FAILURE (ICD-428.0) will recheck K+ level and will also check Mg+  His updated medication list for this problem includes:    Carvedilol 12.5 Mg Tabs (Carvedilol) .Marland Kitchen... Take 1 tablet by mouth two times a day.    Furosemide 40 Mg Tabs (Furosemide) .Marland Kitchen... Take 1 tablet by mouth once a day.    Methazolamide 50 Mg Tabs (Methazolamide) .Marland Kitchen... Take 1 tablet by mouth three times a day.  Orders: Venipuncture (98119) TLB-Lipid Panel (80061-LIPID) TLB-BMP (Basic Metabolic Panel-BMET) (80048-METABOL) TLB-Magnesium (Mg) (83735-MG) TLB-A1C / Hgb A1C (Glycohemoglobin) (83036-A1C)  Complete Medication List: 1)  Cosopt 2-0.5 % Mg/ml Soln (dorzolamide Hcl-timolol Mal)  .Marland Kitchen.. 1 -2 drops in both eyes twice a day. 2)  Carvedilol 12.5 Mg Tabs (Carvedilol) .... Take 1 tablet by mouth two times a day. 3)  Furosemide 40 Mg Tabs (Furosemide) .... Take 1 tablet by mouth once a day. 4)  Isosorbide Mononitrate Cr 60 Mg Xr24h-tab (Isosorbide mononitrate) .... Take 1 1/2 tablets once a day. 5)  Metformin Hcl 1000  Mg Tabs (Metformin hcl) .... Take 1 tablet by mouth two times a day. 6)  Methazolamide 50  Mg Tabs (Methazolamide) .... Take 1 tablet by mouth three times a day. 7)  Nitrostat Mg Subl (nitroglycerin)  .... As needed. 8)  Pravastatin Sodium 40 Mg Tabs (Pravastatin sodium) .... Take 1 tablet by mouth once a day. 9)  Ranitidine Hcl 150 Mg Tabs (Ranitidine hcl) .... Take 1 tablet by mouth two times a day. 10)  Humalog Mix 75/25 75-25 % Susp (Insulin lispro prot & lispro) .... 85 units every morning and 45 units every evening. 11)  Klor-con M20 20 Meq Cr-tabs (Potassium chloride crys cr) .... Take 1 tablet by mouth once a day. 12)  Cosopt 22.3-6.8 Mg/ml Soln (Dorzolamide hcl-timolol mal) .Marland Kitchen.. 1 drop in right eye twice a day.  Other Orders: Gastroenterology Referral (GI)  Lipid Assessment/Plan:      Based on NCEP/ATP III, the patient's risk factor category is "history of coronary disease, peripheral vascular disease, cerebrovascular disease, or aortic aneurysm along with either diabetes, current smoker, or LDL > 130 plus HDL < 40 plus triglycerides > 200".  The patient's lipid goals are as follows: Total cholesterol goal is 200; LDL cholesterol goal is 70; HDL cholesterol goal is 40; Triglyceride goal is 150.    Colorectal Screening:  Current Recommendations:    Flexible Sigmoidoscopy recommended: scheduled with GI  Colonoscopy Results:    Date of Exam: 10/08/1994    Results: Normal  Patient Instructions: 1)  Please schedule a follow-up appointment in 2 months. 2)  It is important that you exercise regularly at least 20 minutes 5 times a week. If you develop chest pain, have severe difficulty breathing, or feel very tired , stop exercising immediately and seek medical attention. 3)  You need to lose weight. Consider a lower calorie diet and regular exercise.  4)  Check your blood sugars regularly. If your readings are usually above 200 or below 70 you should contact our office. 5)  It is  important that your Diabetic A1c level is checked every 3 months. 6)  Check your feet each night for sore areas, calluses or signs of infection. 7)  Check your Blood Pressure regularly. If it is above: you should make an appointment.  Current Allergies (reviewed today): No known allergies

## 2011-01-21 NOTE — Progress Notes (Signed)
Summary: refill: syringes  Phone Note From Pharmacy Call back at [facility] Phone [phone]   Summary of Call: [facility] [phone] is requesting prodigy syringes #100. Please advise. Initial call taken by: Alysia Penna,  September 09, 2010 2:00 PM  Follow-up for Phone Call        Patient left vm requesting 2 rx's one for diabetic shoes and second rx for needles. These are to be mailed to pt's home.  Follow-up by: Lamar Sprinkles, CMA,  September 09, 2010 4:35 PM  Additional Follow-up for Phone Call Additional follow up Details #1::        ok Additional Follow-up by: Etta Grandchild MD,  September 09, 2010 4:46 PM    New/Updated Medications: INSULIN SYRINGE/NEEDLE 28G X 1/2" 1 ML MISC (INSULIN SYRINGE-NEEDLE U-100) Use as directed up to TID Prescriptions: INSULIN SYRINGE/NEEDLE 28G X 1/2" 1 ML MISC (INSULIN SYRINGE-NEEDLE U-100) Use as directed up to TID  #3 mth x 3   Entered by:   Lamar Sprinkles, CMA   Authorized by:   Etta Grandchild MD   Signed by:   Lamar Sprinkles, CMA on 09/10/2010   Method used:   Print then Mail to Patient   RxID:   [id number] INSULIN SYRINGE/NEEDLE 28G X 1/2" 1 ML MISC (INSULIN SYRINGE-NEEDLE U-100) Use as directed up to TID  #100 x 11   Entered and Authorized by:   Etta Grandchild MD   Signed by:   Etta Grandchild MD on 09/09/2010   Method used:   [facility]  [address] [id number]       [address]       [place], [place]  [address]       Ph: [phone] or [phone]       Fax: [phone]   RxID:   [id number] INSULIN SYRINGE/NEEDLE 28G X 1/2" 1 ML MISC (INSULIN SYRINGE-NEEDLE U-100) Use as directed up to TID  #100 x 11   Entered and Authorized by:   Etta Grandchild MD   Signed by:   Etta Grandchild MD on 09/09/2010   Method used:   [facility]        [facility]  [address] [id number]       [address]       [place], [place]  [address]       Ph: [phone] or  [phone]       Fax: [phone]   RxID:   [id number]

## 2011-01-21 NOTE — Letter (Signed)
Summary: Results Letter  [facility]  [address] [place], [place] [address]   Phone: [phone]  Fax: [phone]        November 01, 2010 MRN: [id number]    [facility] [address] [place], [place]  [address]    Dear Mr. Halk,   At least one of the polyps removed during your recent procedure was proven to be adenomatous.  These are pre-cancerous polyps that may have grown into cancers if they had not been removed.  Based on current nationally recognized surveillance guidelines, I recommend that you have a repeat colonoscopy in 5 years.  We will therefore put your information in our reminder system and will contact you in 5 years to schedule a repeat procedure.  Please call if you have any questions or concerns.       Sincerely,  Rachael Fee MD  This letter has been electronically signed by your physician.  Appended Document: Results Letter letter mailed

## 2011-01-21 NOTE — Procedures (Signed)
Summary: colon   Colonoscopy  Procedure date:  08/13/2000  Findings:      Location:  [facility]                          [facility]  Patient:    [name], [name]                          MRN: [id number] Proc. Date: 08/13/00 Attending:  Verlin Grills, M.D. CC:         Thora Lance, M.D.   Procedure Report  PROCEDURE:  Colonoscopy.  ENDOSCOPIST:  Verlin Grills, M.D.  REFERRING PHYSICIAN:  Thora Lance, M.D., [facility].  INDICATIONS:  [name] is a 60 year old male whose mother was diagnosed with colon cancer.  He is referred for surveillance colonoscopy to remove colorectal neoplasia.  I discussed with [name] the complications associated with colonoscopy and polypectomy, including intestinal bleeding and intestinal perforation. [name] has signed the operative permit.  PREMEDICATION:  Demerol 50 mg, Versed 10 mg.  ENDOSCOPE:  Olympus pediatric colonoscope.  DESCRIPTION OF PROCEDURE:  After obtaining informed consent, the patient was placed in the left lateral decubitus position.  I administered intravenous Demerol and intravenous Versed to achieve conscious sedation for the procedure.  The patients blood pressure, oxygen saturation, and cardiac rhythm were monitored throughout the procedure and documented in the medical regimen.  Anal inspection was normal.  Digital rectal exam revealed a non-nodular prostate.  The Olympus pediatric video colonoscope was introduced into the rectum and under direct vision advanced to the cecum as identified by a normal-appearing ileocecal valve.  The ileocecal valve was intubated and the distal 5 cm of the ileum inspected.  The colonic preparation for the exam today was satisfactory.  The rectum was normal.  Sigmoid colon normal.  The descending colon normal.  Splenic flexure normal.  Transverse colon normal.  Hepatic flexure normal.  Ascending colon  normal.  The cecum and ileocecal valve were normal.  The distal ileum normal.  ASSESSMENT:  Normal proctocolonoscopy to the cecum with intubation of the ileocecal valve and inspection of the distal ileum.  No evidence of colorectal neoplasia.  RECOMMENDATIONS:  Repeat colonoscopy in five years based on his family history for colon cancer (mother). DD:  08/13/00 TD:  08/15/00 Job: [id number] This report was created from the original endoscopy report, which was reviewed and signed by the above listed endoscopist.

## 2011-01-23 NOTE — Progress Notes (Signed)
Summary: FALL  Phone Note Call from Patient   Summary of Call: Male called for pt - Pt currently at [facility] for an office visit. Pt fell and hit his head this am in the bathtub - put hole in tub. He is c/o head pain, vision changes & slight confusion. Advised ER eval asap, she agreed and was taking pt right away.  Initial call taken by: Lamar Sprinkles, CMA,  January 09, 2011 2:29 PM

## 2011-01-23 NOTE — Assessment & Plan Note (Signed)
Summary: 1 MTH FU--STC   Vital Signs:  Patient profile:   60 year old [name] Height:      69 inches (175.26 cm) Weight:      261.25 pounds (118.75 kg) BMI:     38.72 O2 Sat:      97 % on Room air Temp:     98.4 degrees F (36.89 degrees C) oral Pulse rate:   66 / minute Pulse rhythm:   regular Resp:     16 per minute BP sitting:   152 / 82  (left arm) Cuff size:   large  Vitals Entered By: Brenton Grills CMA Duncan Dull) (December 18, 2010 11:28 AM)  Nutrition Counseling: Patient's BMI is greater than 25 and therefore counseled on weight management options.  O2 Flow:  Room air CC: 1 month F/U/aj Is Patient Diabetic? Yes Did you bring your meter with you today? No Pain Assessment Patient in pain? no        Primary Care Provider:  Etta Grandchild MD  CC:  1 month F/U/aj.  History of Present Illness:  Follow-Up Visit      This is a 60 year old man who presents for Follow-up visit.  The patient complains of high blood sugar symptoms, but denies chest pain, palpitations, dizziness, syncope, low blood sugar symptoms, edema, SOB, DOE, PND, and orthopnea.  Since the last visit the patient notes no new problems or concerns.  The patient reports taking meds as prescribed, monitoring BP, monitoring blood sugars, and dietary compliance.  When questioned about possible medication side effects, the patient notes none.  He feels like his sinus infection was getting better on Ceftin but has now returned with left facial and tooth pain.  Preventive Screening-Counseling & Management  Alcohol-Tobacco     Alcohol drinks/day: 0     Alcohol Counseling: not indicated; patient does not drink     Smoking Status: never     Tobacco Counseling: not indicated; no tobacco use  Hep-HIV-STD-Contraception     Hepatitis Risk: no risk noted     HIV Risk: no risk noted     STD Risk: no risk noted  Current Medications (verified): 1)  Cosopt 2-0.5 % Mg/ml Soln (Dorzolamide Hcl-Timolol Mal) .Marland Kitchen.. 1 -2 Drops in  Both Eyes Twice A Day. 2)  Carvedilol 12.5 Mg Tabs (Carvedilol) .... Take 1 Tablet By Mouth Two Times A Day. 3)  Furosemide 40 Mg Tabs (Furosemide) .... Take 1 Tablet By Mouth Once A Day. 4)  Isosorbide Mononitrate Cr 60 Mg Xr24h-Tab (Isosorbide Mononitrate) .... Take 1 1/2 Tablets Once A Day. 5)  Methazolamide 50 Mg Tabs (Methazolamide) .... Take 1 Tablet By Mouth Three Times A Day. 6)  Nitrostat  Mg Subl (Nitroglycerin) .... As Needed. Elijah)  Pravastatin Sodium 40 Mg Tabs (Pravastatin Sodium) .... Take 1 Tablet By Mouth Once A Day. 8)  Ranitidine Hcl 150 Mg Tabs (Ranitidine Hcl) .... Take 1 Tablet By Mouth Two Times A Day. 9)  Humalog Mix 75/25 75-25 % Susp (Insulin Lispro Prot & Lispro) .... 85 Units Every Morning and 45 Units Every Evening. 10)  Cosopt 22.3-6.8 Mg/ml Soln (Dorzolamide Hcl-Timolol Mal) .Marland Kitchen.. 1 Drop in Right Eye Twice A Day. 11)  Lovaza 1 Gm Caps (Omega-3-Acid Ethyl Esters) .... 2 By Mouth Bid 12)  Insulin Syringe/needle 28g X 1/2" 1 Ml Misc (Insulin Syringe-Needle U-100) .... Use As Directed Up To Tid 13)  Januvia 100 Mg Tabs (Sitagliptin Phosphate) .... One By Mouth Once Daily For Diabetes 14)  Promethazine-Dm 6.25-15 Mg/34ml Syrp (Promethazine-Dm) .... 5-10 Ml By Mouth Qid As Needed For Cough  Allergies (verified): 1)  ! Metformin Hcl 2)  ! Lisinopril  Past History:  Past Medical History: Last updated: 09/09/2010 Glaucoma Ulcers--1970s HTN Hypercholesterolemia Diabetes mellitus, type II Congestive heart failure Coronary artery disease Low back pain elevated risk for colon cancer, family history  Past Surgical History: Last updated: 09/09/2010 Prosthetic Cornea placement, right eye-- 2007 [facility] Lumbar fusion   Family History: Last updated: 09/09/2010 mother--breast cancer and also colon cancer  Social History: Last updated: 09/09/2010 Occupation: disabled, blind Married Never Smoked Alcohol use-no Regular exercise-no   Risk Factors: Alcohol  Use: 0 (12/18/2010) Caffeine Use: yes (08/28/2010) Exercise: no (08/28/2010)  Risk Factors: Smoking Status: never (12/18/2010)  Family History: Reviewed history from 09/09/2010 and no changes required. mother--breast cancer and also colon cancer  Social History: Reviewed history from 09/09/2010 and no changes required. Occupation: disabled, blind Married Never Smoked Alcohol use-no Regular exercise-no   Review of Systems  The patient denies anorexia, fever, weight loss, weight gain, chest pain, syncope, dyspnea on exertion, peripheral edema, prolonged cough, headaches, hemoptysis, abdominal pain, hematuria, suspicious skin lesions, enlarged lymph nodes, and angioedema.    Physical Exam  General:  alert, well-developed, well-nourished, well-hydrated, and overweight-appearing.   Head:  normocephalic, atraumatic, no abnormalities observed, and no abnormalities palpated.   Nose:  no external deformity, no nasal discharge, no mucosal pallor, no mucosal edema, no airflow obstruction, no intranasal foreign body, no mucosal friability, no active bleeding or clots, L maxillary sinus tenderness, and R maxillary sinus tenderness.   Mouth:  good dentition and pharynx pink and moist.   Neck:  supple, full ROM, no masses, no thyromegaly, no thyroid nodules or tenderness, no JVD, normal carotid upstroke, no carotid bruits, no cervical lymphadenopathy, and no neck tenderness.   Lungs:  normal respiratory effort, no intercostal retractions, no accessory muscle use, normal breath sounds, no dullness, no fremitus, no crackles, and no wheezes.   Heart:  normal rate, regular rhythm, no murmur, no gallop, no rub, and no JVD.   Abdomen:  soft, non-tender, normal bowel sounds, no distention, no masses, no guarding, no rigidity, no rebound tenderness, no abdominal hernia, no inguinal hernia, no hepatomegaly, and no splenomegaly.   Msk:  normal ROM, no joint tenderness, no joint swelling, no joint warmth, no  redness over joints, no joint deformities, no joint instability, no crepitation, and no muscle atrophy.   Pulses:  R femoral decreased, R popliteal decreased, R posterior tibial decreased, L femoral decreased, L popliteal decreased, L posterior tibial decreased, and L dorsalis pedis decreased.   Extremities:  trace left pedal edema and trace right pedal edema.   Neurologic:  No cranial nerve deficits noted. Station and gait are normal. Plantar reflexes are down-going bilaterally. DTRs are symmetrical throughout. Sensory, motor and coordinative functions appear intact. Skin:  turgor normal, color normal, no rashes, no suspicious lesions, no ecchymoses, no petechiae, no purpura, no ulcerations, and no edema.   Cervical Nodes:  no anterior cervical adenopathy and no posterior cervical adenopathy.   Psych:  Cognition and judgment appear intact. Alert and cooperative with normal attention span and concentration. No apparent delusions, illusions, hallucinations  Diabetes Management Exam:    Foot Exam (with socks and/or shoes not present):       Sensory-Pinprick/Light touch:          Left medial foot (L-4): normal          Left dorsal  foot (L-5): normal          Left lateral foot (S-1): normal          Right medial foot (L-4): normal          Right dorsal foot (L-5): normal          Right lateral foot (S-1): normal       Sensory-Monofilament:          Left foot: normal          Right foot: normal       Inspection:          Left foot: normal          Right foot: normal       Nails:          Left foot: normal          Right foot: normal   Impression & Recommendations:  Problem # 1:  HYPERTENSION, BENIGN ESSENTIAL (ICD-401.1) Assessment New  His updated medication list for this problem includes:    Carvedilol 12.5 Mg Tabs (Carvedilol) .Marland Kitchen... Take 1 tablet by mouth two times a day.    Furosemide 40 Mg Tabs (Furosemide) .Marland Kitchen... Take 1 tablet by mouth once a day.    Methazolamide 50 Mg Tabs  (Methazolamide) .Marland Kitchen... Take 1 tablet by mouth three times a day.    Micardis 40 Mg Tabs (Telmisartan) ..... One by mouth once daily for high blood pressure  BP today: 152/82 Prior BP: 148/70 (11/20/2010)  Prior 10 Yr Risk Heart Disease: N/A (08/28/2010)  Labs Reviewed: K+: 3.9 (11/20/2010) Creat: : 1.1 (11/20/2010)   Chol: 193 (08/28/2010)   HDL: 26.40 (08/28/2010)   TG: 454.0 Triglyceride is over 400; calculations on Lipids are invalid. mg/dL (29/52/8413)  Problem # 2:  SINUSITIS- ACUTE-NOS (ICD-461.9) Assessment: Deteriorated  His updated medication list for this problem includes:    Promethazine-dm 6.25-15 Mg/46ml Syrp (Promethazine-dm) .Marland Kitchen... 5-10 ml by mouth qid as needed for cough    Ceftin 500 Mg Tabs (Cefuroxime axetil) ..... One by mouth two times a day for 14 days  Instructed on treatment. Call if symptoms persist or worsen.   Problem # 3:  HYPERKALEMIA (ICD-276.Elijah) Assessment: Improved  Problem # 4:  DIABETES MELLITUS, TYPE II (ICD-250.00) Assessment: Deteriorated I will try metformin again at his request though I will prescribe an XL formulation to see if that will reduce his stomach cramps His updated medication list for this problem includes:    Humalog Mix 75/25 75-25 % Susp (Insulin lispro prot & lispro) .Marland KitchenMarland KitchenMarland KitchenMarland Kitchen 85 units every morning and 45 units every evening.    Januvia 100 Mg Tabs (Sitagliptin phosphate) ..... One by mouth once daily for diabetes    Micardis 40 Mg Tabs (Telmisartan) ..... One by mouth once daily for high blood pressure    Metformin Hcl 1000 Mg (osm) Xr24h-tab (Metformin hcl) ..... One by mouth once daily for diabetes  Labs Reviewed: Creat: 1.1 (11/20/2010)     Last Eye Exam: normal (06/27/2010) Reviewed HgBA1c results: 9.3 (11/20/2010)  8.8 (08/28/2010)  Complete Medication List: 1)  Cosopt 2-0.5 % Mg/ml Soln (dorzolamide Hcl-timolol Mal)  .Marland Kitchen.. 1 -2 drops in both eyes twice a day. 2)  Carvedilol 12.5 Mg Tabs (Carvedilol) .... Take 1 tablet by  mouth two times a day. 3)  Furosemide 40 Mg Tabs (Furosemide) .... Take 1 tablet by mouth once a day. 4)  Isosorbide Mononitrate Cr 60 Mg Xr24h-tab (Isosorbide mononitrate) .... Take 1 1/2 tablets once a day.  5)  Methazolamide 50 Mg Tabs (Methazolamide) .... Take 1 tablet by mouth three times a day. 6)  Nitrostat Mg Subl (nitroglycerin)  .... As needed. Elijah)  Pravastatin Sodium 40 Mg Tabs (Pravastatin sodium) .... Take 1 tablet by mouth once a day. 8)  Ranitidine Hcl 150 Mg Tabs (Ranitidine hcl) .... Take 1 tablet by mouth two times a day. 9)  Humalog Mix 75/25 75-25 % Susp (Insulin lispro prot & lispro) .... 85 units every morning and 45 units every evening. 10)  Cosopt 22.3-6.8 Mg/ml Soln (Dorzolamide hcl-timolol mal) .Marland Kitchen.. 1 drop in right eye twice a day. 11)  Lovaza 1 Gm Caps (Omega-3-acid ethyl esters) .... 2 by mouth bid 12)  Insulin Syringe/needle 28g X 1/2" 1 Ml Misc (Insulin syringe-needle u-100) .... Use as directed up to tid 13)  Januvia 100 Mg Tabs (Sitagliptin phosphate) .... One by mouth once daily for diabetes 14)  Promethazine-dm 6.25-15 Mg/24ml Syrp (Promethazine-dm) .... 5-10 ml by mouth qid as needed for cough 15)  Micardis 40 Mg Tabs (Telmisartan) .... One by mouth once daily for high blood pressure 16)  Ceftin 500 Mg Tabs (Cefuroxime axetil) .... One by mouth two times a day for 14 days 17)  Metformin Hcl 1000 Mg (osm) Xr24h-tab (Metformin hcl) .... One by mouth once daily for diabetes  Patient Instructions: 1)  Please schedule a follow-up appointment in 2 months. 2)  It is important that you exercise regularly at least 20 minutes 5 times a week. If you develop chest pain, have severe difficulty breathing, or feel very tired , stop exercising immediately and seek medical attention. 3)  You need to lose weight. Consider a lower calorie diet and regular exercise.  4)  Check your blood sugars regularly. If your readings are usually above 200 or below 70 you should contact our  office. 5)  It is important that your Diabetic A1c level is checked every 3 months. 6)  See your eye doctor yearly to check for diabetic eye damage. Elijah)  Check your feet each night for sore areas, calluses or signs of infection. 8)  Check your Blood Pressure regularly. If it is above 130/80: you should make an appointment. 9)  Take your antibiotic as prescribed until ALL of it is gone, but stop if you develop a rash or swelling and contact our office as soon as possible. 10)  Acute sinusitis symptoms for less than 10 days are not helped by antibiotics.Use warm moist compresses, and over the counter decongestants ( only as directed). Call if no improvement in 5-Elijah days, sooner if increasing pain, fever, or new symptoms. Prescriptions: METFORMIN HCL 1000 MG (OSM) XR24H-TAB (METFORMIN HCL) One by mouth once daily for diabetes  #30 x 11   Entered and Authorized by:   Etta Grandchild MD   Signed by:   Etta Grandchild MD on 12/18/2010   Method used:   Electronically to        [facility]  [address] [id number] (retail)       [address]       [place], [place]  [address]       Ph: [phone] or [phone]       Fax: [phone]   RxID:   [id number] CEFTIN 500 MG TABS (CEFUROXIME AXETIL) One by mouth two times a day for 14 days  #28 x 1   Entered and Authorized by:   Etta Grandchild MD   Signed by:  Etta Grandchild MD on 12/18/2010   Method used:   Electronically to        [facility]  [address] [id number] (retail)       [address]       [place], [place]  [address]       Ph: [phone] or [phone]       Fax: [phone]   RxID:   [id number] MICARDIS 40 MG TABS (TELMISARTAN) One by mouth once daily for high blood pressure  #63 x 0   Entered and Authorized by:   Etta Grandchild MD   Signed by:   Etta Grandchild MD on 12/18/2010   Method used:   Samples Given   RxID:   [id number]    Orders Added: 1)  Est. Patient Level IV  [87564]   Immunization History:  Tetanus/Td Immunization History:    Tetanus/Td:  historical (10/22/2010)   Immunization History:  Tetanus/Td Immunization History:    Tetanus/Td:  Historical (10/22/2010)

## 2011-02-19 ENCOUNTER — Ambulatory Visit (INDEPENDENT_AMBULATORY_CARE_PROVIDER_SITE_OTHER): Payer: Medicare Other | Admitting: Internal Medicine

## 2011-02-19 ENCOUNTER — Encounter: Payer: Self-pay | Admitting: Internal Medicine

## 2011-02-19 ENCOUNTER — Other Ambulatory Visit: Payer: Self-pay | Admitting: Internal Medicine

## 2011-02-19 ENCOUNTER — Other Ambulatory Visit: Payer: Medicare Other

## 2011-02-19 DIAGNOSIS — E119 Type 2 diabetes mellitus without complications: Secondary | ICD-10-CM

## 2011-02-19 DIAGNOSIS — I10 Essential (primary) hypertension: Secondary | ICD-10-CM

## 2011-02-19 DIAGNOSIS — I509 Heart failure, unspecified: Secondary | ICD-10-CM

## 2011-02-19 DIAGNOSIS — E875 Hyperkalemia: Secondary | ICD-10-CM

## 2011-02-19 LAB — BASIC METABOLIC PANEL
CO2: 27 mEq/L (ref 19–32)
Chloride: 105 mEq/L (ref 96–112)
Glucose, Bld: 132 mg/dL — ABNORMAL HIGH (ref 70–99)
Potassium: 3.9 mEq/L (ref 3.5–5.1)
Sodium: 140 mEq/L (ref 135–145)

## 2011-02-27 NOTE — Assessment & Plan Note (Signed)
Summary: 2 MO FU / NWS  #   Vital Signs:  Patient profile:   60 year old male Height:      69 inches Weight:      260 pounds BMI:     38.53 O2 Sat:      96 % on Room air Temp:     98.8 degrees F oral Pulse rate:   75 / minute Pulse rhythm:   regular Resp:     16 per minute BP sitting:   140 / 70  (right arm) Cuff size:   large  Vitals Entered By: Rock Nephew CMA (February 19, 2011 10:42 AM)  Nutrition Counseling: Patient's BMI is greater than 25 and therefore counseled on weight management options.  O2 Flow:  Room air CC: follow-up visit Is Patient Diabetic? Yes Did you bring your meter with you today? Yes Pain Assessment Patient in pain? no        Primary Care Provider:  Etta Grandchild MD  CC:  follow-up visit.  History of Present Illness:  Follow-Up Visit      This is a 60 year old man who presents for Follow-up visit.  The patient denies chest pain, palpitations, dizziness, syncope, low blood sugar symptoms, high blood sugar symptoms, edema, SOB, DOE, PND, and orthopnea.  Since the last visit the patient notes no new problems or concerns.  The patient reports taking meds as prescribed, monitoring BP, monitoring blood sugars, and dietary compliance.  When questioned about possible medication side effects, the patient notes none.    Preventive Screening-Counseling & Management  Alcohol-Tobacco     Alcohol drinks/day: 0     Alcohol Counseling: not indicated; patient does not drink     Smoking Status: never     Tobacco Counseling: not indicated; no tobacco use  Hep-HIV-STD-Contraception     Hepatitis Risk: no risk noted     HIV Risk: no risk noted     STD Risk: no risk noted      Sexual History:  currently monogamous.        Drug Use:  never.        Blood Transfusions:  no.    Clinical Review Panels:  Prevention   Last Colonoscopy:  DONE (10/30/2010)  Immunizations   Last Tetanus Booster:  Historical (10/22/2010)   Last Flu Vaccine:  Fluvax 3+  (10/28/2010)   Last Pneumovax:  Pneumovax (10/28/2010)  Lipid Management   Cholesterol:  193 (08/28/2010)   HDL (good cholesterol):  26.40 (08/28/2010)  Diabetes Management   HgBA1C:  9.3 (11/20/2010)   Creatinine:  1.1 (11/20/2010)   Last Dilated Eye Exam:  normal (06/27/2010)   Last Foot Exam:  yes (02/19/2011)   Last Flu Vaccine:  Fluvax 3+ (10/28/2010)   Last Pneumovax:  Pneumovax (10/28/2010)  Complete Metabolic Panel   Glucose:  223 (11/20/2010)   Sodium:  139 (11/20/2010)   Potassium:  3.9 (11/20/2010)   Chloride:  104 (11/20/2010)   CO2:  25 (11/20/2010)   BUN:  16 (11/20/2010)   Creatinine:  1.1 (11/20/2010)   Calcium:  9.4 (11/20/2010)   Medications Prior to Update: 1)  Cosopt 2-0.5 % Mg/ml Soln (Dorzolamide Hcl-Timolol Mal) .Marland Kitchen.. 1 -2 Drops in Both Eyes Twice A Day. 2)  Carvedilol 12.5 Mg Tabs (Carvedilol) .... Take 1 Tablet By Mouth Two Times A Day. 3)  Furosemide 40 Mg Tabs (Furosemide) .... Take 1 Tablet By Mouth Once A Day. 4)  Isosorbide Mononitrate Cr 60 Mg Xr24h-Tab (Isosorbide Mononitrate) .Marland KitchenMarland KitchenMarland Kitchen  Take 1 1/2 Tablets Once A Day. 5)  Methazolamide 50 Mg Tabs (Methazolamide) .... Take 1 Tablet By Mouth Three Times A Day. 6)  Nitrostat  Mg Subl (Nitroglycerin) .... As Needed. 7)  Pravastatin Sodium 40 Mg Tabs (Pravastatin Sodium) .... Take 1 Tablet By Mouth Once A Day. 8)  Ranitidine Hcl 150 Mg Tabs (Ranitidine Hcl) .... Take 1 Tablet By Mouth Two Times A Day. 9)  Humalog Mix 75/25 75-25 % Susp (Insulin Lispro Prot & Lispro) .... 85 Units Every Morning and 45 Units Every Evening. 10)  Cosopt 22.3-6.8 Mg/ml Soln (Dorzolamide Hcl-Timolol Mal) .Marland Kitchen.. 1 Drop in Right Eye Twice A Day. 11)  Lovaza 1 Gm Caps (Omega-3-Acid Ethyl Esters) .... 2 By Mouth Bid 12)  Insulin Syringe/needle 28g X 1/2" 1 Ml Misc (Insulin Syringe-Needle U-100) .... Use As Directed Up To Tid 13)  Januvia 100 Mg Tabs (Sitagliptin Phosphate) .... One By Mouth Once Daily For Diabetes 14)  Promethazine-Dm  6.25-15 Mg/31ml Syrp (Promethazine-Dm) .... 5-10 Ml By Mouth Qid As Needed For Cough 15)  Micardis 40 Mg Tabs (Telmisartan) .... One By Mouth Once Daily For High Blood Pressure 16)  Metformin Hcl 1000 Mg (Osm) Xr24h-Tab (Metformin Hcl) .... One By Mouth Once Daily For Diabetes  Current Medications (verified): 1)  Cosopt 2-0.5 % Mg/ml Soln (Dorzolamide Hcl-Timolol Mal) .Marland Kitchen.. 1 -2 Drops in Both Eyes Twice A Day. 2)  Carvedilol 12.5 Mg Tabs (Carvedilol) .... Take 1 Tablet By Mouth Two Times A Day. 3)  Furosemide 40 Mg Tabs (Furosemide) .... Take 1 Tablet By Mouth Once A Day. 4)  Isosorbide Mononitrate Cr 60 Mg Xr24h-Tab (Isosorbide Mononitrate) .... Take 1 1/2 Tablets Once A Day. 5)  Methazolamide 50 Mg Tabs (Methazolamide) .... Take 1 Tablet By Mouth Three Times A Day. 6)  Nitrostat  Mg Subl (Nitroglycerin) .... As Needed. 7)  Pravastatin Sodium 40 Mg Tabs (Pravastatin Sodium) .... Take 1 Tablet By Mouth Once A Day. 8)  Ranitidine Hcl 150 Mg Tabs (Ranitidine Hcl) .... Take 1 Tablet By Mouth Two Times A Day. 9)  Humalog Mix 75/25 75-25 % Susp (Insulin Lispro Prot & Lispro) .... 85 Units Every Morning and 45 Units Every Evening. 10)  Cosopt 22.3-6.8 Mg/ml Soln (Dorzolamide Hcl-Timolol Mal) .Marland Kitchen.. 1 Drop in Right Eye Twice A Day. 11)  Lovaza 1 Gm Caps (Omega-3-Acid Ethyl Esters) .... 2 By Mouth Bid 12)  Insulin Syringe/needle 28g X 1/2" 1 Ml Misc (Insulin Syringe-Needle U-100) .... Use As Directed Up To Tid 13)  Januvia 100 Mg Tabs (Sitagliptin Phosphate) .... One By Mouth Once Daily For Diabetes 14)  Micardis 40 Mg Tabs (Telmisartan) .... One By Mouth Once Daily For High Blood Pressure 15)  Metformin Hcl 1000 Mg Tabs (Metformin Hcl) .... One By Mouth Two Times A Day  Allergies (verified): 1)  ! Metformin Hcl 2)  ! Lisinopril  Past History:  Past Medical History: Last updated: 09/09/2010 Glaucoma Ulcers--1970s HTN Hypercholesterolemia Diabetes mellitus, type II Congestive heart  failure Coronary artery disease Low back pain elevated risk for colon cancer, family history  Past Surgical History: Last updated: 09/09/2010 Prosthetic Cornea placement, right eye-- 2007 [facility] Lumbar fusion   Family History: Last updated: 09/09/2010 mother--breast cancer and also colon cancer  Social History: Last updated: 09/09/2010 Occupation: disabled, blind Married Never Smoked Alcohol use-no Regular exercise-no   Risk Factors: Alcohol Use: 0 (02/19/2011) Caffeine Use: yes (08/28/2010) Exercise: no (08/28/2010)  Risk Factors: Smoking Status: never (02/19/2011)  Family History: Reviewed history from  09/09/2010 and no changes required. mother--breast cancer and also colon cancer  Social History: Reviewed history from 09/09/2010 and no changes required. Occupation: disabled, blind Married Never Smoked Alcohol use-no Regular exercise-no   Review of Systems  The patient denies anorexia, fever, weight loss, weight gain, chest pain, syncope, dyspnea on exertion, peripheral edema, prolonged cough, headaches, hemoptysis, abdominal pain, hematuria, muscle weakness, suspicious skin lesions, transient blindness, difficulty walking, depression, unusual weight change, abnormal bleeding, and enlarged lymph nodes.   Endo:  Denies cold intolerance, excessive hunger, excessive thirst, excessive urination, heat intolerance, polyuria, and weight change.  Physical Exam  General:  alert, well-developed, well-nourished, well-hydrated, and overweight-appearing.   Head:  normocephalic, atraumatic, no abnormalities observed, and no abnormalities palpated.   Mouth:  good dentition and pharynx pink and moist.   Neck:  supple, full ROM, no masses, no thyromegaly, no thyroid nodules or tenderness, no JVD, normal carotid upstroke, no carotid bruits, no cervical lymphadenopathy, and no neck tenderness.   Lungs:  normal respiratory effort, no intercostal retractions, no accessory  muscle use, normal breath sounds, no dullness, no fremitus, no crackles, and no wheezes.   Heart:  normal rate, regular rhythm, no murmur, no gallop, no rub, and no JVD.   Abdomen:  soft, non-tender, normal bowel sounds, no distention, no masses, no guarding, no rigidity, no rebound tenderness, no abdominal hernia, no inguinal hernia, no hepatomegaly, and no splenomegaly.   Msk:  normal ROM, no joint tenderness, no joint swelling, no joint warmth, no redness over joints, no joint deformities, no joint instability, no crepitation, and no muscle atrophy.   Pulses:  R femoral decreased, R popliteal decreased, R posterior tibial decreased, L femoral decreased, L popliteal decreased, L posterior tibial decreased, and L dorsalis pedis decreased.   Extremities:  trace left pedal edema and trace right pedal edema.   Neurologic:  No cranial nerve deficits noted. Station and gait are normal. Plantar reflexes are down-going bilaterally. DTRs are symmetrical throughout. Sensory, motor and coordinative functions appear intact. Skin:  turgor normal, color normal, no rashes, no suspicious lesions, no ecchymoses, no petechiae, no purpura, no ulcerations, and no edema.   Cervical Nodes:  no anterior cervical adenopathy and no posterior cervical adenopathy.   Psych:  Cognition and judgment appear intact. Alert and cooperative with normal attention span and concentration. No apparent delusions, illusions, hallucinations  Diabetes Management Exam:    Foot Exam (with socks and/or shoes not present):       Sensory-Pinprick/Light touch:          Left medial foot (L-4): normal          Left dorsal foot (L-5): normal          Left lateral foot (S-1): normal          Right medial foot (L-4): normal          Right dorsal foot (L-5): normal          Right lateral foot (S-1): normal       Sensory-Monofilament:          Left foot: normal          Right foot: normal       Inspection:          Left foot: normal           Right foot: normal       Nails:          Left foot: too long  Right foot: too long   Impression & Recommendations:  Problem # 1:  HYPERTENSION, BENIGN ESSENTIAL (ICD-401.1) Assessment Improved  His updated medication list for this problem includes:    Carvedilol 12.5 Mg Tabs (Carvedilol) .Marland Kitchen... Take 1 tablet by mouth two times a day.    Furosemide 40 Mg Tabs (Furosemide) .Marland Kitchen... Take 1 tablet by mouth once a day.    Methazolamide 50 Mg Tabs (Methazolamide) .Marland Kitchen... Take 1 tablet by mouth three times a day.    Micardis 40 Mg Tabs (Telmisartan) ..... One by mouth once daily for high blood pressure  Orders: Venipuncture (16109) TLB-BMP (Basic Metabolic Panel-BMET) (80048-METABOL) TLB-A1C / Hgb A1C (Glycohemoglobin) (83036-A1C)  BP today: 140/70 Prior BP: 152/82 (12/18/2010)  Prior 10 Yr Risk Heart Disease: N/A (08/28/2010)  Labs Reviewed: K+: 3.9 (11/20/2010) Creat: : 1.1 (11/20/2010)   Chol: 193 (08/28/2010)   HDL: 26.40 (08/28/2010)   TG: 454.0 Triglyceride is over 400; calculations on Lipids are invalid. mg/dL (60/45/4098)  Problem # 2:  DIABETES MELLITUS, TYPE II (ICD-250.00) Assessment: Unchanged  The following medications were removed from the medication list:    Metformin Hcl 1000 Mg (osm) Xr24h-tab (Metformin hcl) ..... One by mouth once daily for diabetes His updated medication list for this problem includes:    Humalog Mix 75/25 75-25 % Susp (Insulin lispro prot & lispro) .Marland KitchenMarland KitchenMarland KitchenMarland Kitchen 85 units every morning and 45 units every evening.    Januvia 100 Mg Tabs (Sitagliptin phosphate) ..... One by mouth once daily for diabetes    Micardis 40 Mg Tabs (Telmisartan) ..... One by mouth once daily for high blood pressure    Metformin Hcl 1000 Mg Tabs (Metformin hcl) ..... One by mouth two times a day  Orders: Venipuncture (11914) TLB-BMP (Basic Metabolic Panel-BMET) (80048-METABOL) TLB-A1C / Hgb A1C (Glycohemoglobin) (83036-A1C) Podiatry Referral (Podiatry)  Labs  Reviewed: Creat: 1.1 (11/20/2010)     Last Eye Exam: normal (06/27/2010) Reviewed HgBA1c results: 9.3 (11/20/2010)  8.8 (08/28/2010)  Problem # 3:  CONGESTIVE HEART FAILURE (ICD-428.0) Assessment: Unchanged  His updated medication list for this problem includes:    Carvedilol 12.5 Mg Tabs (Carvedilol) .Marland Kitchen... Take 1 tablet by mouth two times a day.    Furosemide 40 Mg Tabs (Furosemide) .Marland Kitchen... Take 1 tablet by mouth once a day.    Methazolamide 50 Mg Tabs (Methazolamide) .Marland Kitchen... Take 1 tablet by mouth three times a day.    Micardis 40 Mg Tabs (Telmisartan) ..... One by mouth once daily for high blood pressure  Complete Medication List: 1)  Cosopt 2-0.5 % Mg/ml Soln (dorzolamide Hcl-timolol Mal)  .Marland Kitchen.. 1 -2 drops in both eyes twice a day. 2)  Carvedilol 12.5 Mg Tabs (Carvedilol) .... Take 1 tablet by mouth two times a day. 3)  Furosemide 40 Mg Tabs (Furosemide) .... Take 1 tablet by mouth once a day. 4)  Isosorbide Mononitrate Cr 60 Mg Xr24h-tab (Isosorbide mononitrate) .... Take 1 1/2 tablets once a day. 5)  Methazolamide 50 Mg Tabs (Methazolamide) .... Take 1 tablet by mouth three times a day. 6)  Nitrostat Mg Subl (nitroglycerin)  .... As needed. 7)  Pravastatin Sodium 40 Mg Tabs (Pravastatin sodium) .... Take 1 tablet by mouth once a day. 8)  Ranitidine Hcl 150 Mg Tabs (Ranitidine hcl) .... Take 1 tablet by mouth two times a day. 9)  Humalog Mix 75/25 75-25 % Susp (Insulin lispro prot & lispro) .... 85 units every morning and 45 units every evening. 10)  Cosopt 22.3-6.8 Mg/ml Soln (Dorzolamide hcl-timolol mal) .Marland KitchenMarland KitchenMarland Kitchen 1  drop in right eye twice a day. 11)  Lovaza 1 Gm Caps (Omega-3-acid ethyl esters) .... 2 by mouth bid 12)  Insulin Syringe/needle 28g X 1/2" 1 Ml Misc (Insulin syringe-needle u-100) .... Use as directed up to tid 13)  Januvia 100 Mg Tabs (Sitagliptin phosphate) .... One by mouth once daily for diabetes 14)  Micardis 40 Mg Tabs (Telmisartan) .... One by mouth once daily for high  blood pressure 15)  Metformin Hcl 1000 Mg Tabs (Metformin hcl) .... One by mouth two times a day  Patient Instructions: 1)  Please schedule a follow-up appointment in 3 months. 2)  It is important that you exercise regularly at least 20 minutes 5 times a week. If you develop chest pain, have severe difficulty breathing, or feel very tired , stop exercising immediately and seek medical attention. 3)  You need to lose weight. Consider a lower calorie diet and regular exercise.  4)  Check your blood sugars regularly. If your readings are usually above 200 or below 70 you should contact our office. 5)  It is important that your Diabetic A1c level is checked every 3 months. 6)  See your eye doctor yearly to check for diabetic eye damage. 7)  Check your feet each night for sore areas, calluses or signs of infection. 8)  Check your Blood Pressure regularly. If it is above 130/80: you should make an appointment. Prescriptions: HUMALOG MIX 75/25 75-25 % SUSP (INSULIN LISPRO PROT & LISPRO) 85 units every morning and 45 units every evening. Brand medically necessary #1 month x 11   Entered and Authorized by:   Etta Grandchild MD   Signed by:   Etta Grandchild MD on 02/19/2011   Method used:   Electronically to        [facility]  [id number] (retail)       [address]       [place], [place]  [address]       Ph: [phone] or [phone]       Fax: [phone]   RxID:   [id number] MICARDIS 40 MG TABS (TELMISARTAN) One by mouth once daily for high blood pressure  #30 x 11   Entered and Authorized by:   Etta Grandchild MD   Signed by:   Etta Grandchild MD on 02/19/2011   Method used:   Electronically to        [id number] (retail)       [address]       [place], [place]  [address]       Ph: [phone] or [phone]       Fax: [phone]   RxID:   [id number] JANUVIA 100 MG TABS (SITAGLIPTIN PHOSPHATE) One by mouth once daily  for diabetes  #30 x 11   Entered and Authorized by:   Etta Grandchild MD   Signed by:   Etta Grandchild MD on 02/19/2011   Method used:   Electronically to        [facility]  [id number] ([facility]ail)       [address]       [place], [place]  [address]       Ph: [phone] or [phone]       Fax: [phone]   RxID:   [id number] LOVAZA 1 GM CAPS (OMEGA-3-ACID ETHYL ESTERS)  2 by mouth BID  #120 x 11   Entered and Authorized by:   Etta Grandchild MD   Signed by:   Etta Grandchild MD on 02/19/2011   Method used:   Electronically to        [id number] ([facility]ail)       [address]       [place], [place]  [address]       Ph: [phone] or [phone]       Fax: [phone]   RxID:   [id number] RANITIDINE HCL 150 MG TABS (RANITIDINE HCL) Take 1 tablet by mouth two times a day.  #60 x 11   Entered and Authorized by:   Etta Grandchild MD   Signed by:   Etta Grandchild MD on 02/19/2011   Method used:   Electronically to        [id number] ([facility]       [address]       [place], [place]  [address]       Ph: [phone] or [phone]       Fax: [phone]   RxID:   [id number] PRAVASTATIN SODIUM 40 MG TABS (PRAVASTATIN SODIUM) Take 1 tablet by mouth once a day.  #30 x 11   Entered and Authorized by:   Etta Grandchild MD   Signed by:   Etta Grandchild MD on 02/19/2011   Method used:   Electronically to        [id number] ([facility]       [address]       [place], [place]  [address]       Ph: [phone] or [phone]       Fax: [phone]   RxID:   [id number] METHAZOLAMIDE 50 MG TABS (METHAZOLAMIDE) Take 1 tablet by mouth three times a day.  #90 x 11   Entered and Authorized by:   Etta Grandchild MD   Signed by:   Etta Grandchild MD on 02/19/2011   Method used:   Electronically to        [id number]       [address]       [place], [place]  [address]       Ph: [phone] or [phone]       Fax: [phone]   RxID:   [id number] ISOSORBIDE MONONITRATE CR 60 MG XR24H-TAB (ISOSORBIDE MONONITRATE) Take 1 1/2 tablets once a day.  #45 x 11   Entered and Authorized by:   Etta Grandchild MD   Signed by:   Etta Grandchild MD on 02/19/2011   Method used:   Electronically to        [facility]  [id number] ([facility]       [address]       [place], [place]  [address]       Ph: [phone] or [phone]       Fax: [phone]   RxID:   [id number] FUROSEMIDE 40 MG TABS (FUROSEMIDE) Take 1 tablet by mouth once a day.  #30 x 11   Entered and Authorized by:   Etta Grandchild MD   Signed by:  Etta Grandchild MD on 02/19/2011   Method used:   Electronically to        [facility] [id number] ([facility]       [address]       [place], [place]  [address]       Ph: [phone] or [phone]       Fax: [phone]   RxID:   [id number] CARVEDILOL 12.5 MG TABS (CARVEDILOL) Take 1 tablet by mouth two times a day.  #60 x 11   Entered and Authorized by:   Etta Grandchild MD   Signed by:   Etta Grandchild MD on 02/19/2011   Method used:   Electronically to        [id number]       [address]       [place], [place]  [address]       Ph: [phone] or [phone]       Fax: [phone]   RxID:   [id number] METFORMIN HCL 1000 MG TABS (METFORMIN HCL) One by mouth two times a day  #60 x 11   Entered and Authorized by:   Etta Grandchild MD   Signed by:   Etta Grandchild MD on 02/19/2011   Method used:   Electronically to        [id number]       [address]       [place], [place]  [address]       Ph: [phone] or [phone]       Fax: [phone]   RxID:   [id number]    Orders Added: 1)  Venipuncture [01751] 2)  TLB-BMP  (Basic Metabolic Panel-BMET) [80048-METABOL] 3)  TLB-A1C / Hgb A1C (Glycohemoglobin) [83036-A1C] 4)  Podiatry Referral [Podiatry] 5)  Est. Patient Level III [02585]

## 2011-03-04 LAB — GLUCOSE, CAPILLARY: Glucose-Capillary: 97 mg/dL (ref 70–99)

## 2011-03-07 LAB — CK TOTAL AND CKMB (NOT AT ARMC)
CK, MB: 1.8 ng/mL (ref 0.3–4.0)
Total CK: 168 U/L (ref 7–232)

## 2011-03-07 LAB — HEPATIC FUNCTION PANEL
Alkaline Phosphatase: 55 U/L (ref 39–117)
Indirect Bilirubin: 0.2 mg/dL — ABNORMAL LOW (ref 0.3–0.9)
Total Bilirubin: 0.4 mg/dL (ref 0.3–1.2)
Total Protein: 7 g/dL (ref 6.0–8.3)

## 2011-03-07 LAB — BASIC METABOLIC PANEL
Chloride: 107 mEq/L (ref 96–112)
Creatinine, Ser: 0.97 mg/dL (ref 0.4–1.5)
GFR calc Af Amer: >60 mL/min (ref >60)
Potassium: 4.1 mEq/L (ref 3.5–5.1)
Sodium: 138 mEq/L (ref 135–145)

## 2011-03-07 LAB — CBC
HCT: 43.7 % (ref 39.0–52.0)
Hemoglobin: 14.1 g/dL (ref 13.0–17.0)
MCV: 72.4 fL — ABNORMAL LOW (ref 78.0–100.0)
Platelets: 178 10*3/uL (ref 150–400)
RBC: 6.04 MIL/uL — ABNORMAL HIGH (ref 4.22–5.81)
WBC: 4.7 10*3/uL (ref 4.0–10.5)

## 2011-03-07 LAB — DIFFERENTIAL
Eosinophils Relative: 3 % (ref 0–5)
Lymphs Abs: 1.5 10*3/uL (ref 0.7–4.0)
Monocytes Relative: 10 % (ref 3–12)

## 2011-03-07 LAB — POCT I-STAT, CHEM 8
Hemoglobin: 16.3 g/dL (ref 13.0–17.0)
Sodium: 140 mEq/L (ref 135–145)
TCO2: 22 mmol/L (ref 0–100)

## 2011-03-07 LAB — POCT CARDIAC MARKERS: Myoglobin, poc: 85.8 ng/mL (ref 12–200)

## 2011-03-07 LAB — PROTIME-INR: Prothrombin Time: 12.9 seconds (ref 11.6–15.2)

## 2011-03-31 LAB — CBC
MCHC: 32 g/dL (ref 30.0–36.0)
MCHC: 32.2 g/dL (ref 30.0–36.0)
MCV: 73.6 fL — ABNORMAL LOW (ref 78.0–100.0)
MCV: 74.9 fL — ABNORMAL LOW (ref 78.0–100.0)
Platelets: 221 10*3/uL (ref 150–400)
Platelets: 226 10*3/uL (ref 150–400)
RDW: 14.6 % (ref 11.5–15.5)

## 2011-03-31 LAB — BASIC METABOLIC PANEL
BUN: 13 mg/dL (ref 6–23)
CO2: 24 mEq/L (ref 19–32)
Chloride: 106 mEq/L (ref 96–112)
Creatinine, Ser: 1.03 mg/dL (ref 0.4–1.5)

## 2011-03-31 LAB — GLUCOSE, CAPILLARY
Glucose-Capillary: 356 mg/dL — ABNORMAL HIGH (ref 70–99)
Glucose-Capillary: 411 mg/dL — ABNORMAL HIGH (ref 70–99)

## 2011-03-31 LAB — CARDIAC PANEL(CRET KIN+CKTOT+MB+TROPI)
CK, MB: 1.2 ng/mL (ref 0.3–4.0)
CK, MB: 1.3 ng/mL (ref 0.3–4.0)
Total CK: 113 U/L (ref 7–232)
Total CK: 97 U/L (ref 7–232)
Troponin I: 0.01 ng/mL (ref 0.00–0.06)
Troponin I: 0.02 ng/mL (ref 0.00–0.06)

## 2011-03-31 LAB — COMPREHENSIVE METABOLIC PANEL
ALT: 23 U/L (ref 0–53)
AST: 23 U/L (ref 0–37)
CO2: 24 mEq/L (ref 19–32)
Calcium: 8.6 mg/dL (ref 8.4–10.5)
Creatinine, Ser: 1 mg/dL (ref 0.4–1.5)
GFR calc Af Amer: >60 mL/min (ref >60)
GFR calc non Af Amer: >60 mL/min (ref >60)
Sodium: 131 mEq/L — ABNORMAL LOW (ref 135–145)
Total Protein: 6.7 g/dL (ref 6.0–8.3)

## 2011-03-31 LAB — GLUCOSE, RANDOM: Glucose, Bld: 460 mg/dL — ABNORMAL HIGH (ref 70–99)

## 2011-03-31 LAB — LIPID PANEL
Cholesterol: 159 mg/dL (ref 0–200)
LDL Cholesterol: 100 mg/dL — ABNORMAL HIGH (ref 0–99)
Total CHOL/HDL Ratio: 7.2 RATIO
VLDL: 37 mg/dL (ref 0–40)

## 2011-03-31 LAB — DIFFERENTIAL
Basophils Absolute: 0 10*3/uL (ref 0.0–0.1)
Eosinophils Relative: 2 % (ref 0–5)
Lymphocytes Relative: 19 % (ref 12–46)
Lymphs Abs: 1.2 10*3/uL (ref 0.7–4.0)
Neutro Abs: 4.6 10*3/uL (ref 1.7–7.7)
Neutrophils Relative %: 73 % (ref 43–77)

## 2011-03-31 LAB — TROPONIN I: Troponin I: 0.02 ng/mL (ref 0.00–0.06)

## 2011-03-31 LAB — CK TOTAL AND CKMB (NOT AT ARMC): Total CK: 123 U/L (ref 7–232)

## 2011-03-31 LAB — POCT CARDIAC MARKERS
CKMB, poc: <1 ng/mL — ABNORMAL LOW (ref 1.0–8.0)
Myoglobin, poc: 87.4 ng/mL (ref 12–200)
Troponin i, poc: <0.05 ng/mL (ref 0.00–0.09)

## 2011-03-31 LAB — HEMOGLOBIN A1C: Hgb A1c MFr Bld: 10.9 % — ABNORMAL HIGH (ref 4.6–6.1)

## 2011-03-31 LAB — PROTIME-INR
INR: 1 (ref 0.00–1.49)
Prothrombin Time: 13.2 seconds (ref 11.6–15.2)

## 2011-03-31 LAB — APTT: aPTT: 27 seconds (ref 24–37)

## 2011-04-03 LAB — DIFFERENTIAL
Basophils Relative: 0 % (ref 0–1)
Eosinophils Absolute: 0.3 10*3/uL (ref 0.0–0.7)
Eosinophils Relative: 6 % — ABNORMAL HIGH (ref 0–5)
Lymphs Abs: 1.2 10*3/uL (ref 0.7–4.0)

## 2011-04-03 LAB — COMPREHENSIVE METABOLIC PANEL
ALT: 29 U/L (ref 0–53)
AST: 23 U/L (ref 0–37)
Alkaline Phosphatase: 57 U/L (ref 39–117)
CO2: 20 mEq/L (ref 19–32)
Calcium: 8.9 mg/dL (ref 8.4–10.5)
Chloride: 109 mEq/L (ref 96–112)
GFR calc Af Amer: >60 mL/min (ref >60)
GFR calc non Af Amer: 60 mL/min — ABNORMAL LOW (ref >60)
Glucose, Bld: 195 mg/dL — ABNORMAL HIGH (ref 70–99)
Potassium: 3.9 mEq/L (ref 3.5–5.1)
Sodium: 137 mEq/L (ref 135–145)
Total Bilirubin: 0.6 mg/dL (ref 0.3–1.2)

## 2011-04-03 LAB — URINALYSIS, ROUTINE W REFLEX MICROSCOPIC
Bilirubin Urine: NEGATIVE
Glucose, UA: NEGATIVE mg/dL
Ketones, ur: NEGATIVE mg/dL
Nitrite: NEGATIVE
Protein, ur: NEGATIVE mg/dL
pH: 5 (ref 5.0–8.0)

## 2011-04-03 LAB — CBC
Hemoglobin: 11.3 g/dL — ABNORMAL LOW (ref 13.0–17.0)
RBC: 4.82 MIL/uL (ref 4.22–5.81)
WBC: 5.3 10*3/uL (ref 4.0–10.5)

## 2011-04-03 LAB — LIPASE, BLOOD: Lipase: 39 U/L (ref 11–59)

## 2011-05-06 NOTE — Consult Note (Signed)
[name], Elijah NO.:  [id number]   MEDICAL RECORD NO.:  [id number]          PATIENT TYPE:  INP   LOCATION:  3732                         FACILITY:  [facility]   PHYSICIAN:  Jake Bathe, MD      DATE OF BIRTH:  12/01/1951   DATE OF CONSULTATION:  11/18/2007  DATE OF DISCHARGE:                                 CONSULTATION   REQUESTING PHYSICIAN:  Theressa Millard, M.D.   PRIMARY CARE PHYSICIAN:  Thora Lance, M.D.   REASON FOR CONSULTATION:  [name] is being seen at the request of  Dr. Earl Gala for the evaluation of chest pain and unstable angina.   HISTORY OF PRESENT ILLNESS:  A 60 year old male with diabetes,  hypertension, hyperlipidemia, obesity, with prior moderate coronary  artery disease medically managed, with one week of increasing exertional  angina, 7/10, described as a burning substernal chest pain without  radiation but occasionally with mild nausea.  Relieved with rest.  A  definite change in clinical symptoms.   In June 2007 Dr. Verdis Prime performed cardiac catheterization, which  demonstrated normal left ventricular ejection fraction, normal left main  artery, LAD artery with 50-60% distal narrowing with diffuse disease in  the diagonal branches, circumflex artery with 50% proximal eccentric  narrowing and some smaller obtuse marginal branches with up to 80%  stenosis, and a right coronary artery with 60-80% narrowing through the  proximal and midvessel, a lot of tortuosity, not felt to be amenable to  percutaneous intervention.   He denies any bleeding episodes.  He does not have any syncope, fevers,  chills.  He has been gaining weight.  Prior sleep apnea, not able to  afford CPAP.   PAST MEDICAL HISTORY:  1. Diabetes.  2. Hypertension.  3. Hyperlipidemia.  4. Blindness secondary to diabetes and glaucoma.  5. Peptic ulcer disease, no recent bleeding.  6. Coronary artery disease as described above.  7. History of epidural  abscess.  8. Obesity, morbid.  9. Sleep apnea.   ALLERGIES:  No known drug allergies.   MEDICATIONS CURRENTLY:  1. Lovenox ACS protocol.  2. Lantus 40 units once a day.  3. Aspirin 81 mg.  4. Caduet 5/10 mg.  5. Nitroglycerin paste.  6. Lisinopril 20 mg once a day.  7. Toprol XL 100 mg once a day.  8. Avandia 8 mg once a day.  9. Amaryl 2 mg once a day.   SOCIAL HISTORY:  He denies any tobacco or alcohol use.  Is married.  Lives with his wife and has 3 sons.   FAMILY HISTORY:  No early family history of coronary artery disease.   REVIEW OF SYSTEMS:  Unless explained above, all other review of systems  negative.  He does snore.   PHYSICAL EXAM:  Temperature 98.2, pulse 80, blood pressure 110/60,  saturating 98% on 2 L.  GENERAL:  Alert and oriented x3, in no acute distress.  CARDIOVASCULAR:  Distant heart sounds.  Regular rate and rhythm.  No  appreciable murmurs, rubs or gallops.  Normal PMI.  HEENT:  Neck is  thick.  No appreciable JVD.  No carotid bruits.  Unable  to palpate thyroid.  Moist mucous membranes.  LUNGS:  Clear to auscultation bilaterally.  No wheezes, no rales.  ABDOMEN:  Obese, positive bowel sounds, nontender.  EXTREMITIES:  No significant edema.  2+ femoral pulses.  SKIN:  Warm, dry and intact.  No rashes.  NEUROLOGIC:  Nonfocal.  No tremors.   DATA:  ECG personally reviewed showed sinus rhythm with no ST changes.  No change compared to prior ECG from June 2007.  Cardiac enzymes:  All  three have thus far been negative.  Chest x-ray personally reviewed was  normal.  Sodium 132, potassium 3.7, BUN 29, creatinine 1.08, glucose  258.  LFTs were okay.  White count 5, hemoglobin 10, hematocrit 32,  platelets 242.   ASSESSMENT AND PLAN:  A 60 year old male with diabetes, hypertension,  hyperlipidemia, morbid obesity and moderate coronary artery disease by  prior catheterization June 2007, with symptoms concerning for unstable  angina/acute coronary  syndrome.   1. Acute coronary syndrome/unstable angina.  Continue Lovenox per ACS      protocol.  Aspirin.  Beta blocker as above.  Continue ACE      inhibitor.  Blood pressure well-controlled.  Also on Norvasc.      Currently pain-free.  Continue nitroglycerin paste.  I have      discussed the possibility of cardiac catheterization tomorrow and      he is amenable to this.  Risks and benefits including stroke, death      and heart attack have been explained.  There is a possibility that      coronary artery disease had progressed and perhaps amenable to      percutaneous intervention.  Continue aggressive medical management.      Will consider a IIb/IIIa inhibitor.  2. Hypertension.  Well-controlled on multidrug regimen as above, which      includes beta blocker and ACE inhibitor.  3. Obesity.  Encourage weight control.  I know this is difficult while      on insulin.  4. Diabetes.  Would recommend discontinuation of Avandia or      rosiglitazone given slightly higher cardiac risk.  Once as an      outpatient, may be suitable for metformin given obesity.  5. Hyperlipidemia.  Continue Caduet or Lipitor.      Jake Bathe, MD  Electronically Signed     MCS/MEDQ  D:  11/18/2007  T:  11/18/2007  Job:  [id number]   cc:   Theressa Millard, M.D.

## 2011-05-06 NOTE — H&P (Signed)
[name], [name]                 ACCOUNT NO.:  [id number]   MEDICAL RECORD NO.:  [id number]          PATIENT TYPE:  INP   LOCATION:  4731                         FACILITY:  [facility]   PHYSICIAN:  Michiel Cowboy, MDDATE OF BIRTH:  [birth year]   DATE OF ADMISSION:  05/25/2009  DATE OF DISCHARGE:                              HISTORY & PHYSICAL   PRIMARY CARE PHYSICIAN:  Thora Lance, M.D.   CHIEF COMPLAINT:  Swelling, rash and chest pain.   The patient is a 60 year old gentleman with a history of diabetes,  coronary artery disease, medical noncompliance and obesity who presented  with sudden onset of rash and hives all over his body.  He had this once  before and was not quite sure what it was from, it spontaneously  resolved.  He was told before that it possibly was related to  medications but he does not know what.   Of note he also states that when he started to itch and develop hives,  he also developed chest pain in the left side of his chest.  He is not  sure if this is related to his hives or not.  His left arm is hurting  where the hives are and he says it may be similar to his pain before  that he had when he had stents but he is not sure.  He is not short of  breath.  No wheezing.  No tongue swelling, no lip swelling.  No  diaphoresis.  Otherwise review of systems is negative.  No fevers, no  chills.  Currently he is chest pain free.  No lower extremity swelling.   REVIEW OF SYSTEMS:  Otherwise review of systems is unremarkable.   PAST MEDICAL HISTORY:  1. Obesity.  2. Diabetes.  3. Coronary artery disease. Status post bare metal stents in November      2008.  4. Mixed hyperlipidemia.  5. Irritable bowel syndrome.  6. Blindness secondary to glaucoma and diabetes, status post corneal      transplant.  7. Peptic ulcer disease.  8. Epidural abscess.  9. Sleep apnea.  10.Hypertension.  11.Unclear etiology of hives in the past.   SOCIAL HISTORY:  Patient does  not smoke, drink or abuse drugs.   FAMILY HISTORY:  Noncontributory.   ALLERGIES:  No known drug allergies.  He does not know why he had hives  in the past.   MEDICATIONS:  1. Humulin 75/25, 85 units b.i.d.  2. Lisinopril 20 mg daily.  3. Aspirin 81 mg daily.  4. Ranitidine 150 mg b.i.d.  5. Acetazolamide 500 mg p.o. twice a day.  6. Pravastatin 40 mg daily.  7. Coreg 12.5 mg twice a day.  8. Isosorbide mononitrate 60 mg daily.  9. Nitroglycerin as needed.  10.Lasix 40 mg daily.  11.Metformin 500 mg b.i.d.  12.Eyedropper, medroxyprogesterone 1% one drop to the right eye.  13.Vancomycin 25 mg/mL 2.5 mL solution one drop to the right eye      t.i.d.  14.Xibrom 0.09% one drop to the right eye b.i.d..  15.Cosopt 1 drop to both  eyes b.i.d.  16.Brimonidine 0.25 one drop to both eyes b.i.d.   PHYSICAL EXAMINATION:  VITAL SIGNS:  Temperature 98.1, blood pressure  175/81, pulse 98, respirations 20, saturating 96% on room air.  The patient is an obese man in no acute distress.  HEENT:  Head normocephalic. Moist mucous membranes, no membrane  swelling.  SKIN: There are various sizes of hives throughout the body.  HEART:  Regular rate and rhythm.  No murmurs appreciated.  LUNGS:  Clear to auscultation bilaterally.  ABDOMEN:  Obese, nontender, nondistended.  LOWER EXTREMITIES:  Without clubbing, cyanosis, or edema.  NEUROLOGIC:  Intact.   LABORATORY DATA:  White blood cell count 6.3, hemoglobin 11.9,  eosinophils 0.1, sodium 136, potassium 4.0, creatinine 1.03.  Cardiac  enzymes negative.  BNP less than 30.  Chest x-ray unremarkable.  EKG  showing no changes from prior.   ASSESSMENT AND PLAN:  This is a 60 year old gentleman with a history of  coronary artery disease, who presents with chest pain and rash.  1. Chest pain.  Because of risk factors, will cycle cardiac markers.      Do serial EKGs and check fasting lipid panel and hemoglobin A1c and      TSH.  If persists to be  symptomatic or if any cardiac markers      become positive, will call cardiology as the patient does have a      history of coronary artery disease.  2. Rash.  Seems to be urticarial, etiology unclear, likely related to      medications as he has not eaten any thing differently or used      different soaps.  Would hold usual suspects, hold his lisinopril      for now, hold metformin and hold statins.  Will continue Coreg and      isosorbide.  Hold Lasix.  He is to continue his eye drops.  He had      no recent medication changes. Will give Benadryl intravenously and      Solu-Medrol as he is having generalized swelling and hives.  If      persists, will consider dermatology consult but also send for CD50      and ANA.  3. Diabetes.  Will use Humulin and sliding scale, hold metformin.  4. Hypertension.  Hold ACE inhibitors in case it is causing his      swelling and continue Coreg and isosorbide.  5. Blindness, corneal transplant.  Continue home medications.  6. Prophylaxis.  Lovenox, continue ranitidine.      Michiel Cowboy, MD  Electronically Signed     AVD/MEDQ  D:  05/25/2009  T:  05/25/2009  Job:  [id number]   cc:   Thora Lance, M.D.  Jake Bathe, MD

## 2011-05-06 NOTE — Cardiovascular Report (Signed)
NAMEZAIYDEN, [name] NO.:  [id number]   MEDICAL RECORD NO.:  [id number]          PATIENT TYPE:  INP   LOCATION:  3732                         FACILITY:  [facility]   PHYSICIAN:  Jake Bathe, MD      DATE OF BIRTH:  1951-09-10   DATE OF PROCEDURE:  11/19/2007  DATE OF DISCHARGE:                            CARDIAC CATHETERIZATION   PRIMARY CARE PHYSICIAN:  Thora Lance, M.D.   INDICATIONS:  A 60 year old male with unstable angina.   PROCEDURE:  Informed consent was obtained.  Risks and benefits explained  to the patient including stroke, heart attack, and death.  The patient  was prepped and draped in sterile fashion placed on a catheterization  table.  Using the modified Seldinger technique, the right femoral artery  was cannulated with a 6-French sheath under assistance of fluoroscopic  positioning.  A Judkins left 4 catheter was selectively cannulated in  the left main artery and multiple views with hand injection were  obtained with Omnipaque.  This catheter was exchanged for right coronary  artery Judkins right 4 catheter and selectively cannulated in the right  coronary artery ostium.  Multiple views obtained with hand injection  were obtained.  This catheter was exchanged for a pigtail which was used  to cross the aortic valve in the RAO position.  Hemodynamics were  obtained in the left ventricle.  A left ventriculogram was performed  using 30 mL of contrast with power injection.  A pullback was then  obtained across the aortic valve to determine if there was a gradient.  Following the diagnostic procedure as described above, discussion with  Dr. Amil Amen - PCI of proximal circumflex artery.   FINDINGS:  1. Left main artery - short.  No significant disease.  2. Left anterior descending artery - this is a large wrap-around      vessel proximal 30% stenosis.  There are 4 small diagonal branches      which are diffusely diseased.  There is distal 80%  stenosis at the      apex of the distal LAD.  3. Left circumflex artery - there was a proximal 95% stenosis which is      new from prior catheterization dated 2007.  There are 3 obtuse      marginal branches, the first of which is a large branching obtuse      marginal.  The second branching segment of this first obtuse      marginal has a 70% proximal stenosis.  There is a 60% proximal      stenosis in the second obtuse marginal branch which is just distal      to the placed stent today.  4. Right coronary artery - this vessel is diffusely diseased and      dominant.  There is proximal/mid 95% stenosis.  This artery has a      shepherd's crook.  The remainder of the artery is quite diffusely      diseased.  5. Hemodynamics - left ventricle systolic pressure was 140, left  ventricular end-diastolic pressure was 17 mmHg.  Aortic pressure      was 135, systolic over 71, diastolic with a mean of 90 mmHg.  There      was no significant gradient.  6. Left ventriculogram - estimated left ventricular ejection fraction      was 65%.  There is no significant mitral regurgitation (mitral      regurgitation was seen during the PVC, which is not clinical      significant).  There were no regional wall motion abnormalities.   Findings discussed with family.   IMPRESSION:  1. A 95% proximal circumflex stenosis - plan PCI, Dr. Corliss Marcus,      bare metal stent 3.5 x 20 mm.  2. Proximal 95% stenosis of the right coronary artery in a diffusely      diseased vessel.  3. Distal LAD 80% stenosis at the apex.  4. Normal left ventricular ejection fraction estimated 65% with no      regional wall motion abnormalities.  5. After plain PCI, femoral angiogram was performed and Angio-Seal was      used as a arteriotomy closure device.      Jake Bathe, MD  Electronically Signed     MCS/MEDQ  D:  11/19/2007  T:  11/20/2007  Job:  [id number]   cc:   Francisca December, M.D.  Thora Lance, M.D.

## 2011-05-06 NOTE — Cardiovascular Report (Signed)
NAMEMCGWIRE, DASARO NO.:  [id number]   MEDICAL RECORD NO.:  [id number]          PATIENT TYPE:  INP   LOCATION:  3732                         FACILITY:  [facility]   PHYSICIAN:  Francisca December, M.D.  DATE OF BIRTH:  05-Nov-1951   DATE OF PROCEDURE:  11/19/2007  DATE OF DISCHARGE:                            CARDIAC CATHETERIZATION   PROCEDURE PERFORMED:  PCI/bare-metal stent implantation proximal left  circumflex.   INDICATIONS:  [name]. Berry is a 60 year old man with known  moderate coronary disease who presented yesterday with accelerated  angina.  He was admitted and observed overnight, placed on Lovenox and  received p.o. aspirin.  Myocardial infarction has been ruled out by  serial CK-MB and troponin enzymes.  Dr. Donato Schultz has completed  coronary angiography revealing subtotal stenosis in the proximal left  circumflex, also a very tortuous and relatively small proximal right  coronary.  He has to undergo chronic catheter-based revascularization of  the circumflex at this time.   PROCEDURE NOTE:  Via the previously placed 6-French catheter sheath, a  3.5 left Voda guiding catheter was advanced to the ascending aorta where  left coronary os was engaged.  The patient received 6200 units of  heparin intravenously as well as a double bolus and constant infusion of  Integrilin.  He also received 300 mg of p.o. Plavix.  The resultant ACT  was greater than 300 seconds.  A 0.014 inch Luge intracoronary guidewire  was passed across the lesion to the proximal circumflex without  difficulty.  Initial balloon dilatation was performed with a 3.0/20 mm  Maverick intracoronary balloon.  It was carefully positioned and  inflated to 6 atmospheres in 2 different positions in the proximal  circumflex.  This balloon was deflated and removed.  A 3.5/20 mm Scimed  Liberte` was carefully positioned in the proximal circumflex across the  lesion and deployed at peak pressure of  14 atmospheres for 42 seconds.  The stent balloon was deflated and removed.  A 4.0/15 mm Quantum  Maverick intracoronary post-dilatation balloon was advanced into place.  This was inflated in 3 different positions proximal, mid and distal in  the stented segment to a peak pressure of 16 atmospheres for not greater  than 30 seconds.  This balloon was deflated and removed.  Following  confirmation of adequate patency in orthogonal views both with and  without the guidewire in place, the guiding catheter was removed.  It  should be noted there was a moderate stenosis in a distal marginal  branch that was not treated after further discussion with Dr. Anne Fu.  It was decided to observe the patient for any further symptoms prior to  proceeding with this lesion as well as the possible dilatation of the  right coronary.   A right femoral arteriogram was performed in the 45 degree RAO  angulation via the catheter sheath by hand injection.  This documented  adequate anatomy for placement of percutaneous closure device Angioseal.  This was subsequently deployed with good hemostasis and an intact distal  pulse.   FINAL IMPRESSION:  1.  Arthrosclerotic cardiovascular disease, two-vessel.  2. Status post successful percutaneous coronary intervention/bare-      metal stent implantation proximal left circumflex.  3. Typical angina was reproduced with device insertion and balloon      inflation.      Francisca December, M.D.  Electronically Signed     JHE/MEDQ  D:  11/19/2007  T:  11/19/2007  Job:  [id number]   cc:   Jake Bathe, MD

## 2011-05-06 NOTE — H&P (Signed)
NAME:  [name], [name]                 ACCOUNT NO.:  [id number]   MEDICAL RECORD NO.:  [id number]          PATIENT TYPE:  INP   LOCATION:  3729                         FACILITY:  [facility]   PHYSICIAN:  Darryl D. Prime, MD    DATE OF BIRTH:  [birth year]   DATE OF ADMISSION:  12/19/2008  DATE OF DISCHARGE:                              HISTORY & PHYSICAL   PRIMARY CARE PHYSICIAN:  Thora Lance, MD   CARDIOLOGIST:  Jake Bathe, MD, [facility].   The patient is full code.  He was the historian.  He is a good  historian.   CHIEF COMPLAINT:  Chest pain, shortness of breath.   HISTORY OF PRESENT ILLNESS:  Mr. Tippin is a 60 year old male with a  history of coronary artery disease status post catheterization in June  2007.  He was found to have moderate-to-severe coronary artery disease  diffuse not amenable to PCI.  He has a history of  PCI from unstable  angina in November 2008, to the left circumflex artery bare-metal stent  was placed.  He also had distal LAD disease 80% in the apex and 70% OM2  in one of it's branches.  He also had proximal OM2 disease 60%.  The RCA  again was severely diffused up to 90% mid and proximal.  The EF at the  most recent cath in 2008 was 65%.  He now presents with chest pain and  shortness of breath over the last 12 hours.  He notes severe chest pain  and shortness of breath with any exertion and sometimes has chest pain  at rest.  He notes diffuse sweating over the last 2 days and has been  having subjective fevers and cough with wheezing, doubt he has nasal  congestion, it is felt due to head cold .  He also had headache.  He has  not been taking any aspirin and he is unsure of what medications he is  taking.  He has had no orthopnea or paroxysmal nocturnal dyspnea, no  palpitations.  No loss of consciousness.  In the emergency room,  medications are pending.   PAST MEDICAL HISTORY AND PAST SURGICAL HISTORY:  1. History of legal blindness from  glaucoma.  2. History of diabetes on insulin.  3. History of hypertension.  4. History of peptic ulcer disease.  5. History of epidural abscess.   ALLERGIES:  No known drug allergies.   MEDICATIONS:  He is unsure of what he is taking at this time.  He is  possibly on Avandia, glimepiride, insulin, lisinopril, Toprol-XL, unsure  of the doses.   SOCIAL HISTORY:  No history of tobacco, alcohol, or illicit drug use; he  has never used any of the above.   FAMILY HISTORY:  Mother had hypertension.  Brother and sister with  diabetes.   REVIEW OF SYSTEMS:  Fourteen-point review of systems were negative  unless stated above.   PHYSICAL EXAMINATION:  VITAL SIGNS:  Temperature is 98.7, blood pressure  174/81, respiratory rate of 18, pulse of 104, sating 98% on 2 L of nasal  cannula.  GENERAL:  He is a male who look his stated age lying in bed and in no  acute distress.  HEENT:  Normocephalic, atraumatic.  The patient's conjunctivae is  injected.  Oropharynx shows no posterior oropharyngeal lesions.  NECK:  Supple with no lymphadenopathy or thyromegaly.  No jugular venous  distention.  No carotid bruits.  LUNGS:  Clear to auscultation bilaterally.  CARDIOVASCULAR:  Regular rhythm and rate with no murmurs, rubs, or  gallops.  S1 and S2.  No S3 and S4.  EXTREMITIES:  No clubbing or cyanosis.  There is trace lower extremity  edema.  NEUROLOGIC:  He is alert and oriented x4.  Cranial nerves II through XII  grossly intact.  Strength and sensation grossly  intact except for eye  sight.   Chest x-ray showed low lung volumes, otherwise negative.  EKG showed  normal sinus rhythm, sinus tach at a rate of 100 beats per minute.  He  does have an S in I, Q in III, and T in III pattern, new compared to  November 2008.   LABORATORY DATA:  Sodium of 134, potassium 3.5, chloride 107, bicarb 17,  BUN 18, creatinine 1.08, glucose elevated at 413, calcium of 8.9.  White  count is 5.9 with a hemoglobin  of 13.4, hematocrit 41.9, platelets 256  with segs of 59%.  Cardiac markers at 2334 were negative.   ASSESSMENT AND PLAN:  This is a patient with history of significant  coronary disease who presents apparently with an anginal equivalent.  I  am also concerned for possible pulmonary embolism given EKG findings and  a predominance of shortness of breath.  He has also has had upper  respiratory infection, which raises suspicion for endopericarditis,  myocarditis, or wheezing and associated cardiopulmonary mismatch, and  subsequent angina.  The patient will be admitted to a telemetry bed and  given aspirin now.  The patient will be placed on beta-blockade and  statin.  We will watch for wheezing on the beta-blocker.  The patient  will be placed on Nitropaste for possible angina.  For his rule out  pulmonary embolism, we will check a D-dimer and if it is positive, we  will try to get a CT scan of the chest to rule out  pulmonary embolism.  For his diabetes, we will place him on sliding scale insulin for now and  try to get his medicine list.  Deep vein thrombosis prophylaxis will be  with Lovenox.  GI prophylaxis with a Zantac.      Darryl D. Prime, MD  Electronically Signed     DDP/MEDQ  D:  12/20/2008  T:  12/20/2008  Job:  [id number]

## 2011-05-06 NOTE — Consult Note (Signed)
NAMEAERON, [name] NO.:  [id number]   MEDICAL RECORD NO.:  [id number]          PATIENT TYPE:  INP   LOCATION:  3729                         FACILITY:  [facility]   PHYSICIAN:  Michiel Cowboy, MDDATE OF BIRTH:  October 25, 1951   DATE OF CONSULTATION:  12/20/2008  DATE OF DISCHARGE:                                 CONSULTATION   PRIMARY CARE PHYSICIAN:  Elijah Lance, MD   REQUESTING PHYSICIAN:  Jake Bathe, MD with [facility].   REASON FOR CONSULTATION:  Diabetes and blood pressure management.   [name] is a 60 year old gentleman with history of uncontrolled  diabetes type 2 and coronary artery disease who reports having trouble  controlling his blood sugars at home and also recently had been having  worsening chest pain described as a chest burning in particular he does  minimal activity such as doing dishes a lot of sweating and shortness of  breath associated with that.  [facility] was initially called for  admission for management of his chest pain as it was thought possibly  cardiac related but the patient was noted to be severely hyperglycemic  with blood sugars in the 400s and also was noted to have mild bicarb  decreased down to 17, at which point [facility] was called for a  consult and management of his diabetes.  The patient initially had vital  signs of systolics of 190s though now in 150s.  Per discussion with the  patient he is legally blind.  His wife helps him administer insulin.  He  could recall that he was actually on Humulin 70/30 at 70 units in the  a.m. and 40 units q. p.m.  His blood sugar still has been somewhat  elevated at home but says it is up and down, sometimes up to 300,  sometimes down to 200.  Otherwise no other complaints.  He has not been  losing weight.  Has had some frequent urination.  Occasional chills but  no fevers.  Shortness of breath and chest pain as per above.  He has  occasional numbness in  his feet.   REVIEW OF SYSTEMS:  Otherwise review of systems is unremarkable.   PAST MEDICAL HISTORY:  1. Significant for legal blindness secondary to glaucoma.  2. Diabetes type 2 on insulin.  3. Hypertension.  4. Peptic ulcer disease in the past.  5. History of epidural abscess in the past.   SOCIAL HISTORY:  The patient never smokes or drinks.  Lives at home with  his wife.   FAMILY HISTORY:  Mother has hypertension and brother has history of  diabetes.   CURRENT MEDICATIONS:  1. Aspirin 325 mg daily.  2. Colace 200 daily.  3. Lovenox daily.  4. Pepcid 20 mg q.h.s.  5. Sliding scale insulin.  6. Imdur 30 mg daily.  7. Lisinopril 20 mg daily.  8. Metoprolol 25 mg q.6 hours.  9. Simvastatin 20 mg daily.  10.Tylenol/morphine as needed.  11.__________ as needed.  12.Glucerna as needed.   Of note, the patient cannot recollect much of his home  medications but  per H and P he is possibly on Actos and glimepiride, insulin Novolin  70/30 at 70 units in a.m. and 40 units in p.m.  Lisinopril dose unclear,  Toprol-XL the patient is unsure of the doses, will need to discuss with  Dr. Kirby Funk who knows his home dosing.   PHYSICAL EXAMINATION:  VITAL SIGNS:  Temperature 97.5, heart rate 89,  respirations 20, satting 99% on 2 liters, blood pressure 153/77.  GENERAL:  The patient appears to be a somewhat obese male in no acute  distress.  HEENT:  Head atraumatic.  NECK:  Obese.  No JVD could be appreciated.  LUNGS:  Distant breath sounds bilaterally but clear to auscultation.  HEART:  Regular rate and rhythm.  No murmurs could be appreciated.  ABDOMEN:  Obese but nontender.  Nondistended.  LOWER EXTREMITIES:  Without clubbing, cyanosis or edema.  NEUROLOGIC:  Intact neurologically.   LABS:  White blood cell count 6.3, hemoglobin 12.7, sodium yesterday was  144, potassium 3.5, bicarb 17, creatinine 1.08.  No chemistry available  today.  ABG 7.342/44.7.  D-dimer 0.56.  Lactate  1.6.  UA negative.  No  ketones noted.   Chest x-ray showing decreased lung markings but otherwise unremarkable.  D-dimer noted to be 0.56.  EKG showed sinus tachycardia initially but no  awareness of acute infarction or ischemia.   ASSESSMENT:  This is a 60 year old gentleman with uncontrolled diabetes  presenting with chest pain.  At this point not sure if cardiac related.   RECOMMENDATIONS:  1. Diabetes type 2 uncontrolled, I do not think there is any evidence      of diabetic ketoacidosis at this point.  There is no evidence of      ketones in urine although there is a mild metabolic acidosis      present.  At this point there is no need for an insulin drip but      will make sure that we start him on some standing insulin while in      house probably Lantus is more beneficial than Novolin N but that      could be restarted as an outpatient since it is probably more      affordable.  For right now we will at least start him on Lantus 40      units and sliding scale insulin.  This likely needs to be adjusted      depending on how well the patient does.  His blood sugar is now      coming down and below 250.  Will need to defer to Dr. Kirby Funk      for how he would like to continue to adjust his insulin since Dr.      Kirby Funk knows the patient very well.  He used to be taking      Actos and will defer to cardiology as I think there is no evidence      of heart failure but Actos could be restarted.  Agree with gentle      hydration if the patient can tolerate it.  2. Hypertriglyceridemia.  Will start on TriCor.  3. Chest pain.  As per cardiology but given  slightly elevated D-      dimer, would recommend CT angiogram to rule out PE given that the      patient came in with tachycardia, shortness of breath and chest      pain.  4. Hypertension.  The  patient is on metoprolol and lisinopril.  Will      continue to readjust the dosages to optimize his blood pressure.      If  further heart rate decrease it would be beneficial if cardiology      shows if having ischemia.  At this point no evidence of such per      cardiac enzymes or EKG but if further lowering of heart rate would      be beneficial I guess metoprolol could be adjusted first.  Would      also benefit from having notes sent down from      Dr. Amedeo Kinsman office to see what his home doses are.  Will      attempt to obtain from the computer system.  5. Mild metabolic acidosis per labs yesterday.  Will repeat CMP now.      No evidence of elevated lactate at this point or elevated ketone      levels.  Further workup may be necessary if this does not improve.      Michiel Cowboy, MD  Electronically Signed     AVD/MEDQ  D:  12/20/2008  T:  12/20/2008  Job:  [id number]   cc:   [name], M.D.  Jake Bathe, MD

## 2011-05-06 NOTE — Discharge Summary (Signed)
NAMEHANNIBAL, [name] NO.:  [id number]   MEDICAL RECORD NO.:  [id number]          PATIENT TYPE:  INP   LOCATION:  3729                         FACILITY:  [facility]   PHYSICIAN:  Jake Bathe, MD      DATE OF BIRTH:  [birth year]   DATE OF ADMISSION:  12/19/2008  DATE OF DISCHARGE:  12/21/2008                               DISCHARGE SUMMARY   PRIMARY CARE PHYSICIAN:  Thora Lance, MD   CARDIOLOGIST:  Veverly Fells. Anne Fu, MD   FINAL DIAGNOSES:  1. Chest pain - likely secondary to uncontrolled hypertension, which      is due to medical noncompliance.  2. Obesity.  3. Diabetes.  4. Mixed hyperlipidemia.  5. Metabolic syndrome.  6. Blindness due to glaucoma and diabetes.  7. Peptic ulcer disease.  8. History of epidural abscess.  9. Sleep apnea.  10.Uncontrolled hypertension.  11.Coronary artery disease.   PROCEDURES:  Chest x-ray showed low lung volumes, otherwise, no acute  cardiopulmonary abnormality.  EKG showed sinus rhythm with poor R-wave  progression, possible old inferior infarction, otherwise, unremarkable.  No change from prior.   LABORATORY DATA:  Sodium 137, potassium 3.6, CO2 19, BUN 17, creatinine  1.1, glucose 261 on discharge.  LFTs within normal limits.  Albumin  slightly low at 3.1.  White count 5.3, hemoglobin 12.1, hematocrit 38  slightly low, platelet count 229.  Lactic acid was normal.  Urinalysis  showed no ketones; however, a large amount of glucose.  Cardiac  biomarkers were negative x3.  Arterial blood gas was 7.34/33.7/117.  Lipid panel showed total cholesterol of 249, triglycerides of 420, LDL  uncalculated, and HDL of 24.  D-dimer was slightly elevated at 0.56 -  likely secondary to active inflammatory process secondary to obesity.   BRIEF HOSPITAL COURSE:  This is a 60 year old male with diabetes  complicated by blindness, coronary artery disease with moderate-to-  severe diffuse coronary artery disease status post left circumflex  stent, bare-metal stent placed in November 19, 2007, a 3.5 x 20 mm who  was admitted with increased shortness of breath and chest discomfort  over the past 2 weeks, which intensified on the night of admission.  He  describes trouble walking from his bedroom to bathroom without getting  severely short of breath and having burning in his chest wall.  On  arrival to emergency department, his blood pressure was in the 180s over  110s systolic and diastolic.  Once admitted and placed on medical  regimen, his blood pressure responded beautifully to regimen as below.  He feels much better, as he is normotensive.  A consultation was  performed by [facility] hospitalist, Dr. Therisa Doyne to assist with  diabetes control.  When he was admitted, his pH was slightly low and his  CO2 was also slightly low and my worry was mild form of DKA.  I did give  him back some fluids and verify that there was no evidence of ketones in  his urine.  His wife left a message on both Dr. Jone Baseman phone and my  phone yesterday  highly concerned about the cost of his medications;  therefore, I have adjusted all of his medicines as below to 4 dollar a  month or 10 dollar every 3 months' prescriptions, and I have given him a  90-day prescriptions for every one of them.   On day of discharge, he was resting comfortably without any chest pain,  eager to be discharged.   PHYSICAL EXAMINATION:  VITAL SIGNS:  Blood pressure 134/75 to 111/75,  sating 95% on room air, afebrile, 98.5, respiration rate 18.  GENERAL:  Alert and oriented x3 in no acute distress, resting  comfortably in bed.  CARDIOVASCULAR:  Regular rate and rhythm with no murmurs, rubs, or  gallops.  Distant heart sounds due to body habitus.  LUNGS:  Clear to auscultation bilaterally.  No wheezes, no rales.  ABDOMEN:  Obese, positive bowel sounds, nontender.  EXTREMITIES:  No clubbing, cyanosis, or edema.  Decreased sensation,  bilateral feet.  Difficult to  palpate distal pulses.  HEENT:  Bilateral blindness noted.  NEUROLOGIC:  Nonfocal.   DISCHARGE MEDICATIONS:  1. Aspirin 81 mg once a day.  2. Carvedilol 12.5 mg twice a day.  3. Pravastatin 40 mg once a day.  4. Ranitidine 150 mg twice a day for acid reflux.  5. Lisinopril 20 mg once a day.  6. Isosorbide mononitrate 30 mg once a day.  7. Fish oil 1 g 3 times a day.  8. Nitroglycerin 0.4 mg sublingual p.r.n. chest pain.  9. Metformin 1000 mg twice a day.  10.Humulin insulin 70/30, 75 units a.m., 40 units p.m. (Dr. Valentina Lucks      saw [name] early on morning of discharge and has been working      with him closely to adjust his insulin regimen and this is what he      recommended on discharge).   FOLLOWUP:  I will see [name] on Thursday, January 05, 2008, at  1:15, and he is to see Dr. Valentina Lucks as previously scheduled.  No  restrictions.  Diet, low sodium, heart healthy.  He knows to contact me  immediately if any worrisome symptoms occur.      Jake Bathe, MD  Electronically Signed     MCS/MEDQ  D:  12/21/2008  T:  12/21/2008  Job:  [id number]   cc:   Thora Lance, M.D.

## 2011-05-06 NOTE — H&P (Signed)
[name], [name]                 ACCOUNT NO.:  [id number]   MEDICAL RECORD NO.:  [id number]          PATIENT TYPE:  INP   LOCATION:  3732                         FACILITY:  [facility]   PHYSICIAN:  Kela Millin, M.D.DATE OF BIRTH:  1951/04/09   DATE OF ADMISSION:  11/17/2007  DATE OF DISCHARGE:                              HISTORY & PHYSICAL   PRIMARY CARE PHYSICIAN:  Thora Lance, M.D.   CHIEF COMPLAINT:  Chest pain.   HISTORY OF PRESENT ILLNESS:  The patient is an obese 60 year old black  male with past medical history significant for coronary artery disease,  status post cardiac catheterization in June 2007 which revealed  moderately severe right coronary artery disease, diffuse and not  amendable to PCI also with mild-moderate LAD disease unchanged from  prior catheterization and with normal left ventricular function per Dr.  Verdis Prime, managed medically, diabetes mellitus, hypertension, peptic  ulcer disease, who presents with the above complaints.  He states that  he was in his usual state of health until about a week ago when he began  having chest pain precipitated by exertion.  He reports that just  walking from his bedroom to the bathroom brings on chest pain described  as a burning sensation across his whole chest, but also more localized  sharp pains in his left precordium, 7-8/10 in intensity and associated  with shortness of breath.  He states that on the day of presentation  when he walked to his car, he began having these pains that were also  associated with nausea and vomiting.  He denies diaphoresis and no  radiation.  Patient also denies cough, fevers, hematemesis, dysuria,  diarrhea, melena, no hematochezia.  He states that the pain has worsened  in intensity over the past week relieved by rest as already mentioned.  The patient was seen in the ER and then EKG was done which revealed  normal sinus rhythm.  No ST elevation or depression noted.  His point  of  care markers were negative.  A chest x-ray was negative for acute  cardiopulmonary disease.  He is admitted for further evaluation and  management.  It is noted that the patient was admitted with unstable  angina in June 2007 when he had the cardiac catheterization mentioned  above.   In the ER, the patient was started on nitro paste, therapeutic dose of  Lovenox, Toprol as well as aspirin.  He denied any chest pain at the  time he was examined.  He is admitted for further evaluation and  management.   PAST MEDICAL HISTORY:  1. As above.  2. History of epidural abscess.  3. History of glaucoma.  He is legally blind.   MEDICATIONS:  The patient knows the names of his medications, but not  the doses.  The doses indicated below are those from his discharge  summary from his last hospitalization in 2007.  1. Avandia 8 mg p.o. daily.  2. Amaryl 2 mg daily.  3. Lisinopril/HCT 20/25 mg.  4. Toprol XL 100 mg daily.  5. Lantus 35 units.  6.  Caduet 5/10 mg p.o. daily.  7. Aspirin 81 mg daily.  8. Nitroglycerin.   ALLERGIES:  NKDA.   SOCIAL HISTORY:  Denies tobacco.  He also denies alcohol.   FAMILY HISTORY:  His mother had hypertension.  His brother and sister  have diabetes.  His father died of complications of alcoholism.   REVIEW OF SYSTEMS:  As per HPI, other review of systems negative.   PHYSICAL EXAMINATION:  GENERAL:  The patient is an obese, older black  male in no apparent distress.  VITAL SIGNS:  Temperature 99, blood pressure 128/69, initially 162/59,  pulse 95, respiratory rate 18, O2 sat of 98%.  HEENT:  Normocephalic, atraumatic, moist mucous membranes.  He is  legally blind.  No oral exudates.  NECK:  Supple.  No adenopathy.  No thyromegaly.  No JVD.  LUNGS:  Clear to auscultation bilaterally.  No crackles or wheezes.  CARDIOVASCULAR:  Regular rate and rhythm.  Normal S1 and S2.  No S3  appreciated.  ABDOMEN:  Soft, obese.  Bowel sounds are present.  Nontender  and  nondistended.  No organomegaly.  No mass is palpable.  EXTREMITIES:  No cyanosis and no edema.  NEURO:  He is alert and oriented x three.  Strength is 5/5 and  symmetric, nonfocal exam.   DIAGNOSTICS:  Point of care markers, chest x-ray and EKG as per HPI.   LABORATORY DATA:  White cell count 5.9, hemoglobin 11.1, hematocrit  33.9, platelet count 247, sodium 132, potassium 4.2, chloride 106, BUN  29, glucose 362, creatinine 1.0.   ASSESSMENT//PLAN:  1. Chest pain, questionable unstable angina, as discussed above.  The      patient placed on aspirin, Lovenox and topical nitrates.  His      Toprol and lisinopril continued.  Will obtain serial cardiac      enzymes, consult cardiology in a.m. pending enzymes.  As mentioned      in the HPI, his last cardiac cath was done by Dr. Katrinka Blazing.  Continue      Caduet and also place the patient on a PPI.  2. Uncontrolled diabetes mellitus.  Will place on sliding scale.      Continue Amaryl and Avandia.  Obtain his outpatient medication      dosages from family and resume.  3. Hypertension.  Continue his outpatient medication.  4. Coronary artery disease as in number 1 above.  5. History of glaucoma, legally blind.      Kela Millin, M.D.  Electronically Signed     ACV/MEDQ  D:  11/18/2007  T:  11/18/2007  Job:  [id number]   cc:   Thora Lance, M.D.  Lyn Records, M.D.

## 2011-05-09 NOTE — Discharge Summary (Signed)
[facility]  Patient:    [name], [name] Visit Number: [id number] MRN: [id number]          Service Type: [facility] Location: [id number] Attending Physician:  [name] T Dictated by:   [name], P.A. Admit Date:  08/19/2001 Discharge Date: 09/03/2001   CC:         [name], M.D.  [name]. [name], M.D.  [name], M.D.   Discharge Summary  DISCHARGE DIAGNOSES:  1. Cervical abscess secondary to lumbar abscess.  2. Lumbar abscess secondary to laminectomy.  3. Diabetes mellitus.  4. Hypertension.  5. Glaucoma.  6. Depression.  7. Peripheral neuropathy.  8. Severe muscle spasms.  9. History of sepsis. 10. Mild hyponatremia.  HISTORY OF PRESENT ILLNESS:  The patients a 60 year old black male with a past medical history of hypertension, diabetes mellitus, vascular disease, and a recent lumbar laminectomy (L4-L5) on June 27 admitted August 17 with back pain and wound drainage. Blood cultures and wound cultures came back positive for Staphylococcus. The patient underwent I&D on August 17 by Dr. Gerlene [name] who started the patient on antibiotics per ID. Hospital course significant for anemia, fluctuating temperatures, deltoid muscle weakness and neck pain. MRI of the cervical spine demonstrated a C3 and C6 abscess. On August 11, 2001, the patient underwent a cervical laminectomy for wound drainage by Dr. Gerlene [name]. The patients on ______  and IV cefazolin. PT report this time indicated the patient is ambulating approximately 50 feet with rolling walker with guard and assists and transfers sit to stand with minimal assist.  PAST MEDICAL HISTORY:  Significant for CHF, glaucoma, cardiovascular disease, hypertension, dyslipidemia, GI bleed in 1980 and diabetes mellitus.  PAST SURGICAL HISTORY:  Significant for lumbar laminectomy in June 2002, eye surgery, hernia repair and cardia cath.  MEDICATIONS PRIOR TO ADMISSION:  1.  Altace.  2. Amaryl.  3. Avandia.  4. Pravachol.  5. HCTZ.  6. Timoptic.  7. ______.  REVIEW OF SYSTEMS:  Significant for ulcer, glaucoma, and chest pain. Primary care [name] is Dr. Kirby [name].  SOCIAL HISTORY:  The patient is married and has three children. Occupation, he is laid off from [employer] work. He was independent prior to admission. Denies any tobacco or alcohol use.  FAMILY HISTORY:  Noncontributory.  ID DOCTOR:  [name].  HOSPITAL COURSE:  Mr. ______ was admitted to rehab on August 19, 2001 where he was seen for comprehensive patient rehabilitation where he received more than three hours of PT and OT daily. [name] has several medical issues during his 15 day stay in rehab. His hospital course is significant for anemia, increased pain with intolerance, depression, severe muscle spasm, increased blood pressure and increased CBGS. At the time of admission, [name] received two units of packed red blood cells due to severe hemoglobin secondary to blood loss. After the transfusion, hemoglobin did improve. He was also started on trinsicon p.o. b.i.d. which was later discontinued before discharge. He was also started on skelaxin and Zanaflex for muscle spasms and doses had to be increased accordingly. [name] blood sugars remained fairly elevated despite several adjustments in Lantus. His diet was changed to 1800 ADA diet and he received no concentrated sweets.  He did not tolerate pain well and experienced crying spells and suicidal thoughts at times. The patient was consulted by Dr. Leonides [name], neuropsychologist, who recommended starting the patient on Paxil and Xanax p.r.n. He also received OxyContin 20 mg p.o. q.  12h for pain. On September 20, 2001, the patient was started on topramax for peripheral neuropathy and nerve pain did improve.  Dressing changes and packing of incisions were performed daily by R.N.  ID followed the patient while in rehab. He  remained on IV cefazolin, ______ through his entire stay in rehab. Due to increased blood pressure on August 24, 2001, Altace was increased to 10 mg p.o. b.i.d. and blood pressure improved slightly. On September 01, 2001, Toprol was also increased to 200 mg p.o. q.d. due to elevation in blood pressure.  Latest labs indicated his hemoglobin was 10.0, hematocrit was 29.8. His white blood cell count was 6.4, his platelet count was 455. Occult blood was all negative. Hemoglobin A1C on August 20, 2001 was 9.3, sodium was 128, potassium 3.9, glucose 155, BUN 17, creatinine 0.8, alkaline phosphatase 163.  A C reactive protein was performed on September 4 which was 5.5. Urine cultures demonstrated insignificant growth, 5000 colonies on August 19, 2001. MRI was performed on September 02, 2001 prior to discharge. Cervical MRI demonstrated improved ______ abscess of the C3 to C6. Lumbar spine MRI demonstrated;  1) worsening L4-L5 diskitis with osteomyelitis, 2) increased paraspinal abscess both adjacent to the psoas as well as the dorsum within the erectospinal muscles and increase in epidural abscess near the thecal sac. At the time of discharge, blood pressure was 160/78, respiratory rate 28, pulse 80, temperature 97.2. CBGs ranged from 152 to 130, 170, and 132. Back had mild drainage per R.N. The rest of the physical examination was really unremarkable. PT report at that time indicating that patient was ambulating slowly 150 feet with rolling walker, could transfer sit to stand with supervision, and he was also ambulating with supervision. He could perform most ADLs with supervision. The patient was discharged home with his wife.  DISCHARGE MEDICATIONS:  1. Zanaflex 4 mg four times daily.  2. Skelaxin 400 mg 1-2 tablets every six hours as needed for spasm.  3. Paxil 20 mg daily.   4. OxyContin 20 mg one tablet every 12 hours.  5. OxyCodone 5 mg one to two tablets every 4-6h as needed for  pain.  6. Potassium, K-Dur 4 mEq daily.  7. Multivitamin with iron daily.  8. Lantus 46 units injection at night.  9. Avandia 4 mg take 2 tablets daily. 10. Amaryl 4 mg daily. 11. Pravachol 20 mg daily. 12. HCTZ 25 mg one tablet daily. 13. Toprol XL 100 mg two tabs daily. 14. Altace 210 mg twice daily. 15. Cefazolin IV daily for 19 more days. 16. ______ 300 mg twice daily. 17. Topramax 50 mg daily. 18. The patient is to resume all glaucoma medications.  DISCHARGE ACTIVITY:  Use walker.  DISCHARGE DIET:  No concentrated sweets, low cholesterol. Will check blood sugars at least twice daily and observe hyperglycemic precautions, eat three times daily.  WOUND CARE:  Per R.N. and home health. They were due to perform the surgery incision impaction. He will receive advanced home health care for nursing, OT, and PT and wound care, IV, and antibiotics.  He is to follow-up with Dr. Gerlene [name] within two weeks. He is to follow-up with Dr. Faith [name] as needed and follow-up with Dr. Kirby [name] his primary care physician in four weeks. Follow-up with the ID doctor on October 2002 at 11:15. Dictated by:   [name], P.A. Attending Physician:  [name] T DD:  09/16/01 TD:  09/16/01 Job: [id number]

## 2011-05-09 NOTE — Consult Note (Signed)
[place]. [facility]  Patient:    [name]                         MRN: [id number] Proc. Date: 01/22/00 Adm. Date:  [id number] Attending:  Rosanne Sack CC:         Thora Lance, M.D.             Rosanne Sack, M.D.                          Consultation Report  CONCLUSIONS: 1. Prolonged chest pain of ischemic quality. 2. Diabetes mellitus with retinopathy. 3. History of glaucoma. 4. Hypertension. 5. Hyperlipidemia. 6. Obesity.  RECOMMENDATIONS: 1. Coronary angiography to define coronary anatomy. 2. Nitroglycerin for chest pain. 3. Continued current medical regimen. 4. Start heparin if recurrent chest discomfort.  COMMENTS:  The patient is 60 years of age and has a recently diagnosed history f diabetes.  He has had retinal surgery by Dr. Chales Abrahams and also has a history of glaucoma.  There is a longstanding history of hypertension, and recently he has  been off of his medications for hypertension and diabetes.  While at work yesterday, he developed chest pressure, left neck and arm discomfort with numbness, dizziness, and diaphoresis.  The entire episode lasted 30 minutes.  He has had o recurrent discomfort since that time.  Since admission, he has two trace positive troponin I determinations.  PHYSICAL EXAMINATION:  GENERAL:  The patient is in no distress.  VITAL SIGNS:  Blood pressure 160/70, heart rate 88.  SKIN:  Clear.  HEENT:  Pupils equal and reactive to light.  NECK:  No JVD, carotid bruits, or thyromegaly.  CARDIAC:  Exam reveals an S4 gallop, no murmurs.  ABDOMEN:  Soft.  Liver and spleen are not palpable.  No bowel sounds or bruits re heard.  EXTREMITIES:  No edema.  Femoral pulses 2+ and symmetric.  NEUROLOGIC:  Exam is unremarkable.  LABORATORY DATA:  The EKG is normal.  Troponin #2 is 0.13. DD:  01/22/00 TD:  01/22/00 Job: [id number]

## 2011-05-09 NOTE — Consult Note (Signed)
[facility]  Patient:    [name], [name] Visit Number: [id number] MRN: [id number]          Service Type: FTC Location: FOOT Attending Physician:  Sharren Bridge Dictated by:   Jonelle Sports Cheryll Cockayne, M.D. Proc. Date: 10/21/01 Admit Date:  10/21/2001   CC:         Enedina Finner, M.D.  Thora Lance, M.D.   Consultation Report  HISTORY:  This 60 year old black male is referred at the courtesy of Dr. Ninetta Lights for consideration of preventive foot care.  The patient has had diabetes mellitus for some four years and takes insulin. He has tended to dry skin on his feet and to callus formation and recently has been noted to have some loss of sensation in his forefeet.  He also is status post lumbar laminectomy with secondary Staphylococcal infection and has had some difficulty resolving that chronic infection.  When he was most recently seen by Dr. Ninetta Lights the foot problems were mentioned and were clinically noted and he is referred here for our evaluation and treatment.  PAST MEDICAL HISTORY:  In addition to the things mentioned, the patient has had some hypertension and some degree of depression.  ALLERGIES:  No known drug allergies.  MEDICATIONS:  1. Rifampin and Ancef for his ongoing infection.  2. Oxycodone  3. Skelaxin.  4. Zanaflex.  5. Lantus insulin 46 units q.h.s.  6. Amaryl daytime.  7. Avandia.  8. Pravachol.  9. Hydrochlorothiazide. 10. Toprol XL. 11. Altace. 12. Topamax. 13. Paxil.  PHYSICAL EXAMINATION  EXTREMITIES:  Distal lower extremities:  The feet are without gross deformity. Skin temperatures are diminished, but symmetrical bilaterally.  There is no edema.  Pedal pulses are diminished, but can be detected by Dopplers in all locations.  The skin of the feet is characterized by extreme dryness throughout and there is significant callus formation underlying both first metatarsal heads and also medially at the  interphalangeal joint of the right toe.  Both heels are involved with superficial callus as well.  The nails are in need of reduction.  Monofilament testing shows that he lacks protective sensation in the toes and metatarsal head areas, but does retain protection in the areas of the arch.  IMPRESSION: 1. Diabetic neuropathy with preulcerative calluses of the feet. 2. Peripheral vascular disease. 3. Chronic Staphylococcal infection.  DISPOSITION: 1. The patient is given instruction regarding foot care and diabetes by video    with nurse and physician reinforcement. 2. The afore mentioned calluses on the right hallux and both heels and both    plantar first metatarsal head areas are reduced by ______. 3. The patients nails are reduced manually by the nurse. 4. The patient is given bag balm to use on a nightly basis to his feet to deal    with the dryness and hopefully soften and minimize further callus    formation. 5. In view of his chronic infection, his insensate nature of his feet, the dry    cracking skin, and the preulcerative calluses, it is felt the patient is at    some risk of foot ulceration and accordingly he is provided with    prescription for custom molded inserts.  He will consult [facility] for this purpose. 6. Followup visit to this clinic will be on a p.r.n. basis. Dictated by:   Jonelle Sports Cheryll Cockayne, M.D. Attending Physician:  Sharren Bridge DD:  10/21/01 TD:  10/21/01 Job: [id number]  [id number]

## 2011-05-09 NOTE — Cardiovascular Report (Signed)
[place]. [facility]  Patient:    [name]                         MRN: [id number] Proc. Date: 01/22/00 Adm. Date:  [id number] Attending:  Rosanne Sack CC:         Room 6524, bed #1             Thora Lance, M.D.             Celso Sickle, M.D.                        Cardiac Catheterization  CINE NUMBER:  [id number]  INDICATIONS:  The patient is diabetic, has uncontrolled hypertension and presented with a prolonged episode of ischemic quality chest pain and trace elevated troponin levels.  PROCEDURE PERFORMED: 1. Left heart catheterization. 2. Selective coronary angiogram. 3. Left ventriculography.  DESCRIPTION OF PROCEDURE:  After informed consent, a 6 French sheath was inserted into the right femoral artery using the modified Seldinger technique.  A 6 French A2 multipurpose catheter used for hemodynamic recordings, left ventriculography and selective left and right coronary angiography.  We were unable to directly cannulate using the left or right coronary with this multipurpose catheter.  We  subsequently then used preformed 6 [place] #4 left and right Judkins catheters for left and right coronary angiography, respectively.  The patient tolerated the procedure without complications.  RESULTS:  I. HEMODYNAMIC DATA:    a. The aortic pressure was 127/67 mmHg.    b. Left ventricular pressure 137/18 mmHg.  II: LEFT VENTRICULOGRAPHY:  The left ventricle cavity is normal in size.  There is mild global hypokinesis with an ejection fraction of 50-55%.  No mitral regurgitation of significance is noted.  III: SELECTIVE CORONARY ANGIOGRAPHY:    a. Left main:  Normal.    b. Left anterior descending coronary artery:  The LAD gives origin to       four diagonals.  Each diagonal was quite small.  The LAD contains       minimal luminal irregularities.  It is large and wraps around the       left ventricular apex.  The first  diagonal contains a mid 70% stenosis,       the second diagonal contains a 70-80% stenosis.  Each of these diagonals       are very small.    c. Circumflex artery:  This is a large vessel that gives origin to three       obtuse marginal branches.  The first obtuse marginal branch is large and       bifurcates.  Just distal to the bifurcation in the main limb of the first  obtuse marginal there is a 60-70% lesion and the smaller limb contains an       80% ostial stenosis.  The second obtuse marginal is small.  The third obtuse       marginal contains no significant obstruction.    d. Right coronary artery:  This is a tortuous diffusely diseased vessel with  distal, tandem 60% lesions before a small to moderate sized PDA.  The PDA       also contains luminal irregularities.  CONCLUSIONS: 1. Moderate atherosclerosis involving the diagonals of the LAD, the first    obtuse marginal and the distal right coronary. 2. Overall normal left ventricular ejection fraction with the appearance of  mild global hypokinesis. 3. The patients prolonged episode of ischemic quality chest discomfort likely    represents pain potentially due to ruptured plaque with transient thrombosis  with spontaneous resolution.  PLAN: 1. Aggressive risk factor modification. 2. Aspirin one per day. 3. Nitroglycerin p.r.n. for recurrent episodes of chest pain. 4. Consider the use of ACE inhibitors to preserve LV function and    beta blocker therapy to prevent future cardiac events. DD:  01/22/00 TD:  01/22/00 Job: [id number]

## 2011-05-09 NOTE — Consult Note (Signed)
Arceneaux, Deandre   [place]. [facility]                                  MRN:  [id number]         Consultation Report                                  ATT:  Hal T. Stoneking, M.D.  Procedure:  08/07/01             DICT: REASON FOR CONSULTATION: Mr. Khawaja is a very nice 60 year old black male, who is followed by Dr. Kirby Funk for hypertension and diabetes, elevated cholesterol.  He was hospitalized back in June 2002 for lumbar laminectomy and hospital course was complicated by acute pulmonary edema.  He was seen by Dr. Armanda Magic and responded well to diuresis.  He has a prior history of coronary artery disease.  He had catheterization in January 2001 with an ejection fraction of 50-55% and had a mid first diagonal of 70-80%, mid second diagonal of 70-80%, and a 60% right coronary artery lesion.  He has denied any chest pain or shortness of breath but over the last two weeks he has had increasing low back pain, quite severe.  He has followed up with Dr. Gerlene Fee with no specific findings noted.  However, he states he has had drainage from his wound from his laminectomy.  About two to three days he then developed a sore area in the left side of his neck.  Vioxx did seem to help with the pain. He was brought into the hospital at this time for further evaluation of his back pain.  CURRENT MEDICATIONS:  1. Amaryl 4 mg q.d.  2. Avandia 4 mg b.i.d.  3. Toprol XL 50 mg q.d.  4. Zestoretic 20/25 mg q.d.  5. Sometimes he is on Altace and hydrochlorothiazide as he has had trouble     affording Zestoretic.  6. Timoptic.  7. Xalatan.  8. Azopt.  9. Alphagan. 10. Pravachol 20 mg at night.  PAST MEDICAL HISTORY:  1. Hypertension.  2. Hypercholesterolemia.  3. History of diabetes since 1997.  4. History of glaucoma.  5. Upper GI bleed in the past.  6. Coronary artery disease.  Catheterization as described above.  7. Peptic ulcer disease with  surgery back in 1980.  SOCIAL HISTORY: He is currently married.  Unemployed.  He is a former custodian at [employer].  He has children ages 73, 37, and 43.  FAMILY HISTORY: Father died of complications of alcoholism.  Mother had hypertension.  Brother and sister with diabetes.  REVIEW OF SYSTEMS: He complains of significant pain in the left side of the neck, also pain in his low back.  Denies any chest pain or shortness of breath.  PHYSICAL EXAMINATION:  VITAL SIGNS: Blood pressure 181/87, pulse 115, temperature 99.1 degrees.  HEART: Regular rate and rhythm without murmurs.  LUNGS: Clear.  ABDOMEN: Obese.  No hepatosplenomegaly or masses palpated.  LABORATORY DATA: WBC 11,600, hemoglobin 9.3, platelet count 453,000.  Sodium 134, potassium 3.3, chloride 99, bicarbonate 31, BUN 10, creatinine 0.9.  UA normal.  Glucose 343.  ASSESSMENT:  1. Hypertension.  He had stopped his hydrochlorothiazide and his blood     pressure is elevated due to that and due to the fact that he has increased  pain.  He apparently has had an abnormal MRI and will be going to surgery     soon.  I would suggest Catapres patch but will clear this with anesthesia     first.  2. Diabetes mellitus.  Agree with sliding-scale until he is able to take     his regular p.o. medications.  We will follow postoperatively and help with management of hypertension and diabetes. DD:  08/07/01 TD:  08/09/01 Job: [id number]

## 2011-05-09 NOTE — Discharge Summary (Signed)
NAMEDEZMON, CONOVER NO.:  [id number]   MEDICAL RECORD NO.:  [id number]          PATIENT TYPE:  INP   LOCATION:  3708                         FACILITY:  [facility]   PHYSICIAN:  Thora Lance, M.D.  DATE OF BIRTH:  1951/05/01   DATE OF ADMISSION:  05/29/2006  DATE OF DISCHARGE:  06/02/2006                                 DISCHARGE SUMMARY   __________ aspirin and Lovenox.  He ruled out for an MI. He experienced no  further chest pain throughout the hospitalization.  His blood sugars were  initially poorly controlled but improved with his medications.  He had a  coronary catheterization on July 11 which showed normal left main, luminal  irregularities of the LAD, the circumflex OM-1, 60-70%; OM-2 was 80%; and a  branch was at 70-80%.  RCA showed diffuse disease proximal-to-distal with 60-  80% blockages.  Dr. Michaelle Copas impression was that he had moderate-to-severe  RCA disease not amenable to PCI.  There was also moderate left circumflex  disease, normal LV function.  Medical therapy is recommended. The patient  was discharged in good condition.   DISCHARGE DIAGNOSES:  1.  Unstable angina.  2.  Coronary artery disease.  3.  Diabetes mellitus.  4.  Hypertension  5.  Glaucoma.   PROCEDURE:  Cardiac catheterization.   DISCHARGE MEDICATIONS:  1.  Insulin Lantus 35 units q.12 h.  2.  Toprol XL 100 mg 1 p.o. daily.  3.  Avandia 8 mg 1 p.o. daily.  4.  Glimepiride 2 mg 1 p.o. daily.  5.  Lisinopril HCT 20/25 mg 1 p.o. daily.  6.  Caduet 5/10 one p.o. daily.  7.  Nexium 40 mg 1 p.o. daily.  8.  Aspirin 81 mg 1 p.o. daily.  9.  Nitroglycerin 0.4 mg sublingual p.r.n. chest pain.  10. Glaucoma eye drops.   DISPOSITION:  Discharged to home.   DIET:  Low sodium, diabetic diet.   ACTIVITY:  As tolerated.   FOLLOWUP:  In 1 week with Dr. Valentina Lucks.           ______________________________  Thora Lance, M.D.     JJG/MEDQ  D:  06/03/2006  T:  06/03/2006  Job:   [id number]   cc:   Lyn Records, M.D.  Fax: [phone]

## 2011-05-09 NOTE — Procedures (Signed)
[facility]  Patient:    [name], [name]                          MRN: [id number] Proc. Date: 08/13/00 Attending:  Verlin Grills, M.D. CC:         Thora Lance, M.D.   Procedure Report  PROCEDURE:  Colonoscopy.  ENDOSCOPIST:  Verlin Grills, M.D.  REFERRING PHYSICIAN:  Thora Lance, M.D., [facility].  INDICATIONS:  [name] is a 60 year old male whose mother was diagnosed with colon cancer.  He is referred for surveillance colonoscopy to remove colorectal neoplasia.  I discussed with [name] the complications associated with colonoscopy and polypectomy, including intestinal bleeding and intestinal perforation. [name] has signed the operative permit.  PREMEDICATION:  Demerol 50 mg, Versed 10 mg.  ENDOSCOPE:  Olympus pediatric colonoscope.  DESCRIPTION OF PROCEDURE:  After obtaining informed consent, the patient was placed in the left lateral decubitus position.  I administered intravenous Demerol and intravenous Versed to achieve conscious sedation for the procedure.  The patients blood pressure, oxygen saturation, and cardiac rhythm were monitored throughout the procedure and documented in the medical regimen.  Anal inspection was normal.  Digital rectal exam revealed a non-nodular prostate.  The Olympus pediatric video colonoscope was introduced into the rectum and under direct vision advanced to the cecum as identified by a normal-appearing ileocecal valve.  The ileocecal valve was intubated and the distal 5 cm of the ileum inspected.  The colonic preparation for the exam today was satisfactory.  The rectum was normal.  Sigmoid colon normal.  The descending colon normal.  Splenic flexure normal.  Transverse colon normal.  Hepatic flexure normal.  Ascending colon normal.  The cecum and ileocecal valve were normal.  The distal ileum normal.  ASSESSMENT:  Normal  proctocolonoscopy to the cecum with intubation of the ileocecal valve and inspection of the distal ileum.  No evidence of colorectal neoplasia.  RECOMMENDATIONS:  Repeat colonoscopy in five years based on his family history for colon cancer (mother). DD:  08/13/00 TD:  08/15/00 Job: [id number]

## 2011-05-09 NOTE — Consult Note (Signed)
[place]. [facility]  Patient:    MATIS, MONNIER                        MRN: [id number] Proc. Date: 06/17/01 Adm. Date:  [id number] Disc. Date: [id number] Attending:  Gerald Dexter CC:         Darci Needle, M.D.  Thora Lance, M.D.  Reinaldo Meeker, M.D.   Consultation Report  REFERRING PHYSICIAN:  Reinaldo Meeker, M.D.  PRIMARY CARDIOLOGIST:  Dr. Katrinka Blazing  PRIMARY DOCTOR:  Dr. Valentina Lucks.  CHIEF COMPLAINT:  Acute respiratory distress.  HISTORY OF PRESENT ILLNESS:  This is a 60 year old black male patient of Dr. Katrinka Blazing and Dr. Valentina Lucks who was admitted to outpatient surgery for lumbar laminectomy.  He had an uncomplicated surgical procedure.  During extubation he had severe laryngospasm and then developed acute respiratory distress.  His oxygen saturation has decreased to 50% on 4 L when turned on his side.  A STAT portable chest x-ray showed severe diffuse alveolar infiltrates.  The patient was coughing up blood tinged ______ sputum.  He denies chest pain and is breathing comfortably at this time.  PAST MEDICAL HISTORY:  Significant for hypertension, hyperlipidemia, diabetes mellitus, sciatica, glaucoma, upper GI bleeding.  He has a history of coronary disease status post cardiac catheterization in January 2001 by Dr. Katrinka Blazing showing low normal EF of 50-55%, 70-80% mid first diagonal branch, 70-80% stenosis of a second diagonal, 50-70% narrowing of a branch of obtuse marginal 1, and 80% narrowing of a second branch of the obtuse marginal 1, 60% right distal coronary artery.  PAST SURGICAL HISTORY:  Peptic ulcer disease surgery.  ALLERGIES:  None.  MEDICATIONS: 1. Amaryl 4 mg q.d. 2. Avandia 4 mg q.d. 3. Toprol XL 50 mg q.d. 4. Zestoretic 20/25 mg q.d. 5. Timoptic eye drops. 6. Xalatan eye drops. 7. ______ eye drops. 8. Alphagan eye drops. 9. Pravachol 20 mg q.d.  PHYSICAL EXAMINATION  VITAL SIGNS:  Blood pressure 140/75, heart  rate 83, O2 saturation 95-98% on 10 L face mask.  GENERAL:  This is a well-developed, large black male in no significant distress who is sleeping.  HEENT:  Benign.  NECK:  Supple.  LUNGS:  Diffuse rales throughout anteriorly.  HEART:  Regular rate and rhythm.  No audible murmurs.  ABDOMEN:  Benign.  EXTREMITIES:  No cyanosis, clubbing, or edema.  Dorsalis pedis pulses 1+ bilaterally.  LABORATORIES:  Chest x-ray shows diffuse alveolar infiltrates.  EKG is pending at this time.  ASSESSMENT: 1. Status post lumbar laminectomy. 2. Acute respiratory distress with chest x-ray consistent with acute pulmonary    edema, history of low normal left ventricular function by catheterization    and branch vessel coronary artery disease.  The patient currently denies    chest pain.  I do believe that this is noncardiogenic pulmonary edema.  The    patient had severe laryngospasm during his extubation.  Pulmonary edema is    most likely secondary to acute negative intrathoracic pressure with    extubation causing a capillary leak syndrome.  He is currently oxygenating    well on 10 L face mask and not using accessory muscles for breathing. 3. History of coronary disease, medical management. 4. History of hypertension which is stable. 5. Hyperlipidemia. 6. Diabetes mellitus.  PLAN:  Aggressive diuresis.  Total of 100 mg of IV Lasix was given in the PACU.  ______ IV fluids.  If he  develops worsening respiratory distress with increasing oxygen requirements and difficulty breathing, will consult pulmonary critical care for possible intubation.  Dr. Sung Amabile is already aware of the patient, I have spoken to him.  Will transfer to the ICU for monitoring overnight. DD:  06/17/01 TD:  06/17/01 Job: [id number]

## 2011-05-09 NOTE — Consult Note (Signed)
[name], ALKHATIB NO.:  [id number]   MEDICAL RECORD NO.:  [id number]          PATIENT TYPE:  INP   LOCATION:  1827                         FACILITY:  [facility]   PHYSICIAN:  Francisca December, M.D.  DATE OF BIRTH:  December 23, 1950   DATE OF CONSULTATION:  05/29/2006  DATE OF DISCHARGE:                                   CONSULTATION   REFERRING PHYSICIAN:  Theressa Millard, M.D.   REASON FOR CONSULTATION:  Chest pain.   HISTORY OF PRESENT ILLNESS:  Mr. Velmer Woelfel is a 60 year old man with known  ASCVD dating to a cardiac catheterization in 2001.  At that time, he had a  slight troponin increase and at catheterization was found to have tandem  lesions of 50-60% in the mid to distal right coronary and a 70-80% stenosis  in the first obtuse marginal and distal left circumflex.  Medical therapy  only was recommended.  He had done well until the past week when he noted  recurrence of chest discomfort.  He does not  recall if this is similar to  the past.  He describes it as a tightness in both the left and right chest  and sometimes under the sternum.  The discomfort comes and goes.  It was  worse today.  It lasts 15-20 or 30 minutes once it has its onset but then  slowly resolves.  He has not received any specific treatment for this to  date with the exception of aspirin.  He has associated symptoms of tightness  in the face and numbness and a hard feeling in the left arm.  There is  also some mild diaphoresis.  No nausea or shortness of breath.  At the time  of my evaluation, he just complains of some soreness in both the right and  left chest.  The onset of the discomfort is spontaneous. It is not  necessarily related to exertion.   PAST MEDICAL HISTORY:  1.  ASCVD as noted above.  2.  Diabetes mellitus on Lantus insulin oral agent.  3.  Remote history of peptic ulcer disease.  4.  Hypertension.  5.  Blindness.   PAST SURGICAL HISTORY:  1.  Back surgery 2002.  2.   Right eye surgery and a partial corneal transplant.  3.  Left eye is a surgical implant  4.  Neck surgery secondary to staph infection.  5.  Ulcer surgery.  6.  He is currently preop for consideration of complete corneal transplant      in the right eye next week.   CURRENT MEDICATIONS:  1.  Avandia, ? Dose 1 p.o. daily.  2.  Toprol XL 50 mg p.o. daily.  3.  Glimepiride 2 mg p.o. daily.  4.  Lisinopril/hydrochlorothiazide  20/25 one p.o. daily.  5.  Lantus 60 units nightly.  6.  Cosopt, prednisone, Timolol, and Alphagan eye drops, right eye.   DRUG ALLERGIES:  None known.   SOCIAL HISTORY:  No alcohol or tobacco.  He is disabled, accompanied by his  wife here in emergency room.   FAMILY HISTORY:  Father died of complications of EtOH. Mother has  hypertension.  Brother had diabetes.  Sister has diabetes.   REVIEW OF SYSTEMS:  Negative.  Denies any hematochezia, melena or  hematemesis.   PHYSICAL EXAMINATION:  VITAL SIGNS: Blood pressure of 141/76, pulse of 84  and regular, respiratory rate 20, temperature afebrile.  GENERAL:  This is an obese, pleasant, 60 year old man in no distress.  Well-  kept.  HEENT:  Shows a cloudy cornea on the right, implant on the left.  There is  full scleral erythema/redness on the right eye.  The oral mucosa is pink and  moist.  Tongue is not coated.  NECK:  Supple without thyromegaly or masses.  The carotid strokes are  normal.  No bruit, no JVD.  CHEST:  Chest is clear with adequate excursion bilaterally.  HEART: The heart has regular rhythm.  Normal S1-S2. No murmur, click,  gallop, or rug.  The chest wall is nontender.  ABDOMEN:  Obese, soft, nontender without hepatosplenomegaly or midline  pulsatile mass.  EXTERNAL GENITALIA:  Normal phallus, descended testicles.  No lesions.  RECTAL:  Not performed.  EXTREMITIES:  Show full range of motion.  No edema. I could not feel pedal  pulses  SKIN: Warm, dry and clear.  NEUROLOGIC:  Cranial nerves  III-XII intact.  Motor and sensory grossly  intact.  Gait not tested.   Electrocardiogram: Normal sinus rhythm, poor R wave progression.   CK-MB and troponin enzymes are pending.   Chest x-ray:  No active cardiopulmonary disease.   ASSESSMENT:  1.  Atypical angina, possibly unstable.  2.  History of coronary artery disease dating to 2001 as noted above.  These      are not insignificant lesions.  3.  Diabetes mellitus.  4.  Hypertension.  5.  Remote peptic ulcer disease   PLAN/RECOMMENDATIONS:  1.  I would initiate IV nitroglycerin on this gentleman is well subcutaneous      Lovenox and aspirin.  2.  Would continue the rule out MI protocol to include serial CK-MB,      troponin enzymes as well as telemetry monitoring.  3.  Would recommend repeat cardiac catheterization.  I think his body      habitus and the presence of previous known coronary disease would not      lend itself well to a noninvasive or nuclear cardiology assessment.  We      will      make arrangements for this to be performed by Dr. Lyn Records on June 01, 2006.  4.  I have also informed the patient that he may need to reschedule has      corneal transplant surgery until we have this assessment completed.      Francisca December, M.D.  Electronically Signed     JHE/MEDQ  D:  05/29/2006  T:  05/30/2006  Job:  [id number]   cc:   Theressa Millard, M.D.  Fax: [phone]

## 2011-05-09 NOTE — H&P (Signed)
[place]. [facility]  Patient:    [name]                         MRN: [id number] Adm. Date:  [id number] Attending:  Anastasio Auerbach                         History and Physical  CHIEF COMPLAINT:  Chest pain.  HISTORY OF PRESENT ILLNESS:  A 60 year old black male, former patient of Dr. Beverely Pace with history of hypertension, diabetes mellitus 2, and hyperlipidemia who presents with chest pain.  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 The pain was felt in the left neck and also had a funny feeling in the left arm. He was sent home from work and the pain resolved within 30 to 60 minutes. Today his hands and feet have felt somewhat tight and he was not feeling well and he ame to the emergency room to get checked.  There was no chest pain today.  He has no history of coronary artery disease.  The patient is a former patient of Dr. Beverely Pace but has had no doctor for the last six months as Dr. Beverely Pace no longer takes his  insurance.  He has been out of his Lipitor and Zestoretic for months and out of his Glucophage for four days.  PAST MEDICAL HISTORY: 1. Diabetes mellitus, type 2, for two years. 2. Hypertension for years. 3. Hyperlipidemia. 4. Glaucoma. 5. Upper GI bleed in 1980.  PAST SURGICAL HISTORY:  "Ulcer surgery" in 1980 for a GI bleed.  ALLERGIES:  No known drug allergies.  CURRENT MEDICATIONS: 1. Zestoretic unknown dose. 2. Lipitor unknown dose (taking neither of these for six months). 3. Glucophage 500 mg p.o. b.i.d., out of this for four days. 4. Glaucoma medications.  FAMILY HISTORY:  Mother age 72, colon cancer.  Father; hypertension, question diabetes, and died of alcoholic cirrhosis.  Brother; diabetes, sister; diabetes/.  SOCIAL HISTORY:  He is  married.  Three sons.  He does no smoke or drink alcohol. He is a Arboriculturist for [employer].  REVIEW OF SYSTEMS:  Occasional low back pain, occasional a.m. headaches, erectile dysfunction.  PHYSICAL EXAMINATION:  GENERAL:  A comfortable-appearing black male.  VITAL SIGNS:  Blood pressure 170/96, heart rate 100, respirations 18, temperature 98.2.  Oxygen saturations 99% on room air.  HEENT:  Pupils are equal, round and respond to light.  Extraocular movements intact.  Tympanic membranes are clear.  Oral cavity and pharynx is clear.  NECK:  No lymphadenopathy and no carotid bruits.  LUNGS:  Clear to auscultation.  HEART:  Regular rate and rhythm without murmur, gallop or rub.  ABDOMEN:  Obese, soft, nontender.  No mass, no hepatosplenomegaly.  GENITOURINARY:  Normal descended testicles.  RECTAL EXAM:  Deferred.  EXTREMITIES:  No edema and normal peripheral pulses.  NEUROLOGICAL EXAM:  Nonfocal.  LABORATORY DATA:  Sodium 139, potassium 4.2, chloride 104, bicarbonate 27, BUN 18, creatinine 0.9.  Hemoglobin 13.0.  Urinalysis is normal.  Creatinine kinase is 209, creatinine kinase MB is 1.9, troponin I is 0.09 which borderline high.  Chest x-ray shows no acute disease.  EKG is normal sinus rhythm, normal EKG.  ASSESSMENT: 1. Probable unstable angina. 2. Diabetes mellitus, type 2. 3. Hypertension. 4. Hyperlipidemia.  5. Glaucoma.  PLAN:  Admit.  Aspirin.  Rule out myocardial infarction by cardiac enzymes. Cardiology consult in the morning.  Add heparin and nitroglycerin if he has any  further chest pain.  Restart Zestoretic and Glucophage.  Check lipid profile and hemoglobin A1C. DD:  01/21/00 TD:  01/21/00 Job: [id number]

## 2011-05-09 NOTE — Cardiovascular Report (Signed)
NAMEJERROD, DAMIANO NO.:  [id number]   MEDICAL RECORD NO.:  [id number]          PATIENT TYPE:  INP   LOCATION:  3708                         FACILITY:  [facility]   PHYSICIAN:  Lyn Records, M.D.   DATE OF BIRTH:  25-Jun-1951   DATE OF PROCEDURE:  06/01/2006  DATE OF DISCHARGE:                              CARDIAC CATHETERIZATION   INDICATIONS FOR PROCEDURE:  The patient is obese, diabetic, with previously  documented history of moderate coronary disease.  He presented with atypical  but anginal quality chest discomfort and is undergoing coronary angiography  to define coronary anatomy to help guide therapy.   PROCEDURES PERFORMED:  1.  Left heart catheterization.  2.  Selective coronary angiography.  3.  Left ventriculography.  4.  AngioSeal arteriotomy closure.   DESCRIPTION:  After informed consent, a 6-French sheath was placed in the  right femoral artery using modified Seldinger technique.  A 6-French A2  multipurpose catheter was used for hemodynamic recordings, left  ventriculography by hand injection, and selective left and right coronary  angiography.  We ultimately used a #4 left and a #4 right Judkins catheter  for coronary angiography.  Both catheters were 6-French.   Following the procedure, AngioSeal arteriotomy closure was performed without  complications.  Good hemostasis was achieved.   RESULTS:  1.  Hemodynamic data:      1.  Aortic pressure 140/83.      2.  Left ventricular pressure 140/6 mmHg.  2.  Left ventriculography:  Left ventricular cavity size is normal.  LV      function is normal.  EF is greater than 60%.  3.  Coronary angiography.      1.  Left main coronary:  Widely patent.      2.  Left anterior descending coronary:  The LAD wraps around left          ventricular apex.  Minimal luminal irregularities are noted.  The          distal 50-60% narrowing is seen.  The diagonal branches are          diffusely diseased, small but  still patent.  Each of three diagonal          branches contain what appears to be ostial 60-70% stenosis.  They          are too small to intervene upon.      3.  Circumflex artery:  Circumflex coronary artery is a large vessel          that gives origin to four obtuse marginal branches.  The proximal          circumflex contains eccentric 50% narrowing.  The first obtuse          marginal bifurcates.  The smaller of the branches contains 80%          stenosis of the larger branch contains eccentric 50% stenosis.  The          second obtuse marginal branch contains proximal 80% narrowing.  No  high-grade obstruction is noted.      4.  Right coronary:  The right coronary is diffusely disease from          proximal to distal vessel.  There is 60 to 80% narrowing noted          throughout the proximal and mid vessel where there is a lot of          tortuosity.  No lesion amendable to PCI is felt to be present.   CONCLUSION:  1.  Moderately severe right coronary artery disease that is diffuse and not      amenable at this time to percutaneous coronary intervention due to      tortuosity and the diffuse nature of the disease.  2.  Mild to moderate disease involving the left anterior descending and      circumflex unchanged from prior cath.  3.  Normal left ventricular function.   PLAN:  Continue aggressive medical therapy, use nitroglycerin sublingually  for chest discomfort.      Lyn Records, M.D.  Electronically Signed     HWS/MEDQ  D:  06/01/2006  T:  06/01/2006  Job:  [id number]   cc:   Thora Lance, M.D.  Fax: [phone]

## 2011-05-09 NOTE — Discharge Summary (Signed)
[place]. [facility]  Patient:    [name]                         MRN: [id number] Adm. Date:  [id number] Disc. Date: [id number] Attending:  Rosanne Sack CC:         Thora Lance, M.D. [facility] mailbox)             Thora Lance, M.D., [facility]             Celso Sickle, M.D.                           Discharge Summary  DATE OF BIRTH:  Jun 23, 1951  DISCHARGE DIAGNOSES:  1. Chest pain consistent with anginal cardiac ischemia.     A. Cardiac catheterization (January 22, 2000) consistent with a tandem distal        lesions in the right coronary artery of 50-60%, 70-80% degree lesions in the        distal first obtuse marginal and distal left circumflex.     B. Medical therapy only recommended at this time.     C. Suspected transient thrombosis due to a plaque rupture.     D. Normal left ventricle function.     E. Troponin peaked at 0.13.  2. Uncontrolled type 2 diabetes mellitus.     A. Hemoglobin A1C 9.2%.     B. Retinopathy.  3. Uncontrolled hypertension, improved.  4. Microcytic anemia.     A. Hemoglobin 11.8, MCV 70.     B. Guaiac negative stools.     C. Colonoscopy to be obtained as an outpatient.  5. Hypertriglyceridemia.  6. Glaucoma.  7. History of upper gastrointestinal bleed in 1980.     A. Status post surgery to ulcer in 1980.  DISCHARGE MEDICATIONS:  1. Enteric-coated aspirin 1 tablet p.o. q.d.  2. Toprol XL 50 mg p.o. q.d.  3. Avandia 88 mg p.o. q.d.  4. Glucotrol XL 10 mg p.o. q.d.  5. Zestoretic 20/25 one tablet p.o. q.d.  6. Prevacid 30 mg p.o. q.d.  7. Tricor 1 capsule (67 mg) 1 capsule q.d. with meals.  8. Timoptic XL 0.5% one drop to both eyes in p.m.  9. Xalatan 0.005% one drop to both eyes in a.m. 10. Azopt 1% one drop to both eyes b.i.d. 11. Alphagan 0.2% one drop to both eyes b.i.d. 12. Nitroglycerin 0.4 mg sublingually p.r.n. chest pain.  DISCHARGE FOLLOWUP AND  DISPOSITION:  [name] will be followed by Dr. Kirby [name] in [facility] on Tuesday, January 28, 2000, at 1:45 .m. During this follow-up visit, Dr. Valentina [name] is to reassess the patients diabetes, hypertension.  [name] should receive guaiac cards to rule out guaiac positive stools.  Also, Dr. Valentina [name] will arrange for a colonoscopy to be performed in the next few months.  If the patient were to have recurrent chest symptoms, we recommend to use nitrates, Imdur.  Dr. Garnette [name] will be available for further  consultation if required upon discharge.  Dr. Chales [name], ophthalmologist, will continue providing care for the patients glaucoma and diabetic retinopathy.  CONSULTATIONS:  Dr. Garnette [name] (cardiology).  PROCEDURES:  Cardiac catheterization (see results above).  DISCHARGE LABORATORY DATA:  Hemoglobin A1C 9.2%, hemoglobin 11.8, MCV 70, WBC 5.0, platelets 223.  Sodium 132, potassium 3.7, chloride 104, CO2 24, BUN 12, creatinine  0.9, glucose 223, LFTs within normal limits except the SGOT at 44 and the albumin 3.2.  Urinalysis negative for proteinuria.  Troponin I peaked at 0.13.  CK-MB negative x 2.  Cholesterol 168, triglycerides 244, HDL 26, LDL 93.  HOSPITAL COURSE:  [name] is a very pleasant, 60 year old, African-American  male who was admitted to [facility] on January 21, 2000 with chest pain worrisome for angina.  Please see admission H&P by Dr. Kirby [name] for further  details regarding the history of presentation, physical exam, and lab data.  #1 - CHEST PAIN CONSISTENT WITH ANGINA:  [name] was admitted to a telemetry bed for cardiac monitoring.  The cardiac enzymes and EKG series were obtained. The CK-MBs were negative though the troponin I had a borderline value.  The EKG was  nonacute.  Given that the pretest probability for significant coronary artery disease was 63%, a cardiology consult was obtained for further input.   Dr. Katrinka Blazing agreed with the need of performing a cardiac cath to rule out significant coronary artery disease.  This procedure took place on January 22, 2000, without complications.  This procedure revealed a normal left ventricle function.  The CA had a 50-60% tandem distal lesions, also very distal and tiny lesions were found in the first obtuse marginal and left circumflex of about 70%.  No angioplasty was  performed.  Medical therapy was recommended by Dr. Garnette [name].  Impression was hat the patient could have a transient thrombosis of one of distal lesions due to a  plaque rupture.  Also, other etiologies like coronary artery spasm could still e possible.  Medical therapy was recommended.  Aspirin, a beta blocker, nitroglycerin sublingually as needed was recommended.  If in the future [name] were to have further chest symptoms, Dr. Garnette [name] will be available for consultation. Nitrates will be indicated if [name] were to have further chest symptoms upon discharge.  Tricor was started after discussing the case with a new primary care [name], Dr. Kirby [name].  Aspirin and Toprol will be continued upon discharge. On the day of discharge, the patient was afebrile and hemodynamically stable. o further chest symptoms were reported by the patient throughout this hospital stay.  #2 - UNCONTROLLED TYPE 2 DIABETES MELLITUS:  Uncontrolled hyperglycemia was noticed on admission.  The patient ran out of his Glucophage about four days prior. There was onset of these chest symptoms.  Avandia was started without complications.  Then Glucotrol was added for glycemic control.  Further follow up on this problem will be provided by Dr. Kirby [name].  Dr. Chales [name], will follow the patients diabetic retinopathy.  A hemoglobin A1C was 9.2%.  Further adjustments on the patients oral hypoglycemic agents will be conducted by Dr. Valentina [name] in [facility]  #3 -  UNCONTROLLED HYPERTENSION:  [name] reported the lack of medical therapy at least for three months prior to this admission.  Zestoretic was received on  admission.  On day #2, the stroke was maximized for blood pressure control.  On the day of discharge, Toprol was added.  Further follow up on the patients blood pressure and heart rate will be done by Dr. Valentina [name].  At the time of discharge, the patient was afebrile and hemodynamically stable.  #4 - MICROCYTIC ANEMIA:  No evidence of active bleeding was noticed throughout his hospital stay.  The stool was guaiac negative.  Prevacid was started without complications.  Further work-up of this microcytic anemia will be  done as an outpatient.  The colonoscopy will be arranged by Dr. Kirby [name] upon discharge.  #5 - HYPERTRIGLYCERIDEMIA:  Given the results of the lipid profile, Tricor was started.  Further follow up on this problem will be done by Dr. Valentina [name].  CONDITION ON DISCHARGE:  On the day of discharge, the patients medical condition was determined improved. DD:  01/23/00 TD:  01/23/00 Job: [id number]

## 2011-05-09 NOTE — Op Note (Signed)
. [facility]  Patient:    [name], [name]                        MRN: [id number] Proc. Date: 06/17/01 Adm. Date:  [id number] Disc. Date: [id number] Attending:  Gerald Dexter                           Operative Report  PREOPERATIVE DIAGNOSIS:  Herniated disk at L4-5 right.  POSTOPERATIVE DIAGNOSIS:  Herniated disk at L4-5 right.  OPERATION PERFORMED:  Right L4-5 interlaminar laminotomy for excision of herniated disk with the operating microscope.  SECONDARY PROCEDURE:  Microdissection of L4-5 disk and L5 nerve root.  SURGEON:  Reinaldo Meeker, M.D.  ASSISTANT:  Donalee Citrin, Montez Hageman.  ANESTHESIA:  DESCRIPTION OF PROCEDURE:  After being placed in the prone position, the patients back was prepped and draped in the usual sterile fashion.  A localizing x-ray was taken prior to incision to identify the L4-5 level.  A midline incision was made above the spinous processes of L4 and L5.  Using Bovie cutting current, the incision was carried down the spinous processes. Subperiosteal dissection was then carried out on the right-sided spinous processes and lamina and the McCullough self-retaining retractor was placed for exposure.  A second x-ray was taken to confirm approach to the L4-5 level and this was correct.  Using the high speed drill the inferior one half of the L4 lamina and the medial one third of the facet joint were removed.  The drill was then used to remove the superior one third of the L5 lamina.  Residual bone and ligamentum flavum were removed in a piecemeal fashion.  The microscope was draped and brought into the field and used for the remainder of the case.  Using microdissection technique, the lateral aspect of the thecal sac and L5 nerve root were identified.  The L4-5 disk was identified.  There were marked adhesions between the L5 nerve root and the L4-5 disk and these were dissected free with microdissection technique until the nerve  was well mobilized.  At this point the annulus of the disk was coagulated and incised. Using pituitary rongeurs and curets, a thorough disk space clean out was carried out but at the same time great care was taken to avoid injury to the neural elements and this was successfully done.  At this point inspection was carried out in all directions for any evidence of residual compression and none could be identified. Large amounts of irrigation were carried out and any bleeding controlled with bipolar coagulation and Gelfoam.  The wound was then closed using interrupted Vicryl in the muscle and fascia, subcutaneous and subcuticular tissues and staples on the skin.  Sterile dressing was then applied.  The patient was extubated and taken to recovery room in stable condition. DD:  06/17/01 TD:  06/17/01 Job: [id number]

## 2011-05-09 NOTE — Discharge Summary (Signed)
[place]. [facility]  Patient:    [name], [name] Visit Number: [id number] MRN: [id number]          Service Type: MED Location: 5000 [id number] Attending Physician:  Lillia Mountain Dictated by:   Thora Lance, M.D. Admit Date:  11/10/2001 Disc. Date: 11/12/01   CC:         Lacretia Leigh. Ninetta Lights, M.D.   Discharge Summary  REASON FOR ADMISSION:  This is a 60 year old black male with a history of post cervical and lumbar epidural abscess treated with surgery in August of 2002, followed by a prolonged course of IV and then oral antibiotics, who presents with four weeks of progressive back pain which has become excruciating in the last 24 to 48 hours, associated with some chills.  He has had no significant neurologic symptoms such as progressing weakness or sensory changes in the lower extremity or bowel or bladder incontinence.  SIGNIFICANT FINDINGS ON ADMISSION:  In general, a chronically ill-appearing black male.  Blood pressure is 112/70.  Heart rate is 112.  Temperature is 100.8.  Back exam shows tenderness over the L4 vertebra.  NEUROLOGIC:  Cranial nerves intact.  Strength 5+/5, right upper extremity and left upper extremity, except for 4+/5 left shoulder abduction, bilateral hip flexors 4+/5, other leg muscles 5+/5.  LABORATORY DATA ON ADMISSION:  Chemistry:  Sodium 133, potassium 3.6, chloride 99, bicarbonate 24, glucose 165, BUN 12, creatinine 0.9, total protein 8.5, albumin 3.5, calcium 9.5, SGOT 20, SGPT 13, alkaline phosphatase 99, total bilirubin 0.7.  INR 1.1, PT 14.2.  CBC:  WBC 9.0, hemoglobin 8.7, platelet count 342.  Erythrocyte sedimentation rate 123.  Blood cultures:  No growth so far.  Chest x-ray showed no acute disease.  EKG shows normal sinus rhythm, normal EKG.  HOSPITAL COURSE:  The patient was admitted for fever and back pain with diagnosis of probable worsening infection in the lumbar spine.  An MRI of the cervical spine  was done and showed no significant evidence of infection.  MRI of the lumbar spine showed progressive osteomyelitis at L4-5, with associated progressing paraspinous phlegmon and cord compression.  The patient was seen by Dr. Rockey Situ. Flavia Shipper. of the infectious disease service. Dr. Roxan Hockey recommended surgery.  Dr. Dierdre Searles of the [facility] Medicine Service was contacted and she accepted the patient in transfer.  DISCHARGE DIAGNOSES:  1. Lumbar epidural abscess/lumbar osteomyelitis.  2. Coronary artery disease.  3. Diabetes mellitus.  4. Hypertension.  PROCEDURES:  1. MRI of the C spine.  2. MRI of the lumbosacral spine.  DISCHARGE MEDICATIONS:  1. Keflex 500 mg t.i.d.  2. Paxil 20 mg q.d.  3. OxyContin 20 mg b.i.d.  4. Amaryl 4 mg q.d.  5. Avandia 4 mg b.i.d.  6. Insulin -- Lantus 46 units q.h.s.  7. Zocor 10 mg q.h.s.  8. Hydrochlorothiazide 25 mg q.d.  9. Altace 10 mg b.i.d. 10. Toprol-XL 200 mg q.d. 11. Topamax 50 mg q.d. 12. Multivitamin one q.d. 13. Morphine sulfate IV p.r.n. 14. Timolol XE 0.5% ophthalmic solution one drop each eye h.s. 15. Xalantan 0.005% ophthalmic solution one drop each eye h.s. 16. Alphagan 0.15% ophthalmic solution one drop each eye t.i.d. 18. Azopt 1% ophthalmic solution one drop each eye t.i.d. 19. Potassium chloride 20 mEq q.d. 20. Insulin sliding scale. 21. Zanaflex 4 to 8 mg t.i.d. p.r.n. back spasms.  DISCHARGE DIET:  Low-sodium diabetic diet.  DISPOSITION:  Transfer to [facility]. Dictated  by:   Thora Lance, M.D. Attending Physician:  Lillia Mountain DD:  11/12/01 TD:  11/12/01 Job: [id number]

## 2011-05-09 NOTE — H&P (Signed)
[name]. [facility]  Patient:    [name], [name] Visit Number: [id number] MRN: [id number]          Service Type: Attending:  Thora [name], M.D. Dictated by:   [name], M.D.   CC:         [name]. Ninetta [name], M.D.  Dr. Lynwood [name], [facility] Neurosurgical Department   History and Physical  CHIEF COMPLAINT:  Back pain.  HISTORY OF PRESENT ILLNESS:  This is a 60 year old black male with a history of coronary artery disease, diabetes, and hypertension, who presents with low back pain.  The patient developed back pain secondary to degenerative disk disease in the spring of this year.  He underwent a microdiskectomy by [name], M.D., in June of 2002.  In early August of 2002, the patient developed progressively worsening low back pain and eventually was admitted and found to have both cervical and lumbar epidural abscesses.  These were treated by [name], M.D., with surgery and then a prolonged course of IV and now oral antibiotics under the care of [name], M.D.  He had a prolonged stay in the hospital and was eventually discharged on November 15. 2002.  MRIs of his lumbar spinal and cervical spine on the day prior to his discharge, September 02, 2001, showed continued evidence of infection in both areas.  He has been on IV Ancef and Rifampin and then eventually switched to oral Keflex since discharge.  Over the last month, he has had slowly worsening low back pain requiring narcotics four times a day.  The pain became excruciating last night.  The patient may have been having chills at night, but has not had any definite fever.  His legs are mildly weak, but have not progressed.  He has some mild paresthesias in his legs, also now progressing. No bowel or bladder incontinence.  He has been able to ambulate with a walker. He saw Dr. Lynwood [name] of [facility] Neurosurgical Department about a week or two ago.  Dr. Lynwood [name] reviewed the  MRIs from mid September and recommended repeating the MRIs of the cervical and lumbosacral spine to reevaluate the status of the infection.  This had been scheduled for next Monday.  PAST MEDICAL HISTORY: 1. Coronary artery disease, cardiac catheterization in February of 2001    showing normal LV function, a 50-60% tandem distal lesion, right coronary    artery had distal lesions in the first obtuse marginal, and a 70% left    circumflex, medically managed. 2. Diabetes mellitus for six years. 3. Hypertension. 4. Dyslipidemia with decreased HDL and increased triglycerides. 5. Degenerative disk disease of the lumbosacral spine. 6. Microcytic anemia. 7. Distant history of peptic ulcer disease with surgery in the 1980s. 8. Advanced glaucoma. 9. Upper GI bleed in 1980.  PAST SURGICAL HISTORY: 1. Ulcer surgery in approximately 1980. 2. Drainage of surgical and lumbosacral epidural abscesses in August of 2002    by [name], M.D.  ALLERGIES:  No known drug allergies.  CURRENT MEDICATIONS:  1. Paxil 20 mg a day.  2. Oxycodone 10 mg q.6h. p.r.n.  3. Potassium chloride 10 mEq a day.  4. Multivitamins q.d.  5. Insulin Lantus 46 units q.h.s.  6. Avandia 4 mg twice a day.  7. Amaryl 4 mg once a day.  8. Pravachol 20 mg q.p.m.  9. Hydrochlorothiazide 25 mg q.d. 10. Toprol XL 100 mg two a day. 11. Altace 10 mg twice a day. 12.  Keflex 500 mg p.o. t.i.d.; discontinued yesterday. 13. Topamax 50 mg a day. 14. Multiple glaucoma eyedrops.  FAMILY HISTORY:  His father died of complications of alcoholism and mother with hypertension.  A brother and sister with diabetes mellitus.  SOCIAL HISTORY:  Married.  He has children ages 43, 77, and 42.  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  He does not smoke or drink alcohol.  REVIEW OF SYSTEMS:  As above.  PHYSICAL EXAMINATION:  GENERAL APPEARANCE:  A chronically ill-appearing black  male.  VITAL SIGNS:  The blood pressure is 112/70, the heart rate is 112, and the temperature is 100.8 degrees.  HEENT:  Eyes:  Pupils equal, reactive, and respond to light.  Extraocular movements intact.  Funduscopic not done.  Ears:  Tympanic membranes are clear. The oropharynx is clear.  NECK:  Supple.  No lymphadenopathy.  No carotid bruits.  LUNGS:  Clear.  HEART:  Tachycardic.  Regular.  No murmurs, rubs, or gallops.  ABDOMEN:  Soft and nontender without masses.  GENITOURINARY:  Deferred.  RECTAL:  Deferred.  EXTREMITIES:  No edema.  NEUROLOGIC:  Cranial nerves intact.  Strength is 5+/5 in right upper extremity and 5+/5 in left upper extremity, except for 4+/5 left shoulder abduction. Bilateral hip flexors are 4+/5.  Other leg muscles are 5+/5.  The patient is able to stand.  Reflexes are 1/4 throughout.  The toes are downgoing.  I did not test gait.  BACK:  Tenderness over the L3-4 area.  There is no discharge seen.  LABORATORY DATA:  Pending.  ASSESSMENT: 1. Back pain and fever, rule out recurrent epidural abscesses in the    lumbosacral spine. 2. Coronary artery disease, stable. 3. Diabetes mellitus. 4. Hypertension.  PLAN: 1. Admit. 2. MRI of the cervical spinal and lumbosacral spine ASAP. 3. Blood and urine cultures. 4. Pain control with morphine and OxyContin. 5. Outpatient medicines. 6. Sliding scale insulin. 7. Hold antibiotics for now. 8. May need transfer to [facility] if surgery is needed by Dr. Lynwood [name]. Dictated by:   [name], M.D. Attending:  Thora [name], M.D. DD:  11/10/01 TD:  11/10/01 Job: [id number]

## 2011-05-09 NOTE — H&P (Signed)
NAMEDONTAVIUS, KEIM NO.:  [id number]   MEDICAL RECORD NO.:  [id number]          PATIENT TYPE:  INP   LOCATION:  1827                         FACILITY:  [facility]   PHYSICIAN:  Theressa Millard, M.D.    DATE OF BIRTH:  05/24/1951   DATE OF ADMISSION:  05/29/2006  DATE OF DISCHARGE:                                HISTORY & PHYSICAL   [name] is a 60 year old black male who was admitted with chest  discomfort.   In 2001 he was admitted with chest discomfort.  At that time he had a  cardiac catheterization which revealed 10 RCA lesions and lesions in the  distal obtuse marginal of circumflex.  He had normal LV function.  Since  then he has done well until last week when he developed some left chest  discomfort described as a squeezing and tightness in the chest.  In addition  there have been some pains in the mid chest and even into the right chest at  other times.  It is not exertional in nature.  He has had some tightness in  his face and the left arm has been numb on occasion.  He came to the  emergency department for evaluation.   He has chronic mild dyspnea on exertion and some swelling in his ankles for  the last several months.  He is now admitted for further cardiac evaluation.  CARDIAC RISK FACTORS - besides the presence of his coronary disease include  diabetes and hypertension.   PAST SURGICAL HISTORY:  Back surgery in 2002 complicated by epidural  abscesses requiring lumbar and cervical surgery. Right eye surgery several  times related to glaucoma and a partial corneal transplant.  Left eye  surgery in the past.  Ulcer surgery in the 1980's.   PAST MEDICAL HISTORY:  1.  Peptic ulcer disease.  2.  Diabetes.  3.  Hypertension.   ALLERGIES:  No known drug allergies.   MEDICATIONS:  1.  Avandia (question dose) daily.  2.  Toprol XL (question dose) daily.  3.  Glimepiride 2 mg daily.  4.  Lisinopril-HCTZ 20/25 daily.  5.  Lantus 60 units q.h.s.  6.  Cosopt 2 drops to left eye daily.  7.  Prednisolone 1% one drop x4 to right eye.  8.  Muro 128 one drop x4 to right eye.  9.  Timolol 0.5% one drop b.i.d. to right eye.  10. Alphagan 0.15 % one drop b.i.d. to right eye.   SOCIAL HISTORY:  He does not smoke and does not consume alcohol.  He is on  disability due to total vision loss in the left eye and near total vision  loss in the right eye.   FAMILY HISTORY:  Father died of complications of alcoholism. Mother had  hypertension.  Brother and sister both have diabetes.   REVIEW OF SYSTEMS:  All other systems are negative.   PHYSICAL EXAMINATION:  GENERAL:  He is obese, in no distress.  VITAL SIGNS:  Blood pressure 131/76, pulse 84, respiratory rate 20.  HEENT:  The eyes have cloudy corneas, I  cannot see anything in the middle or  in the retina. Ears are normal, nose and throat are unremarkable.  NECK:  Supple, thyroid is not enlarged or tender.  CHEST:  Clear to auscultation and percussion.  CARDIAC EXAM:  Has normal S1 and S2 without an S3, S4, murmur, rub or click.  ABDOMEN:  Obese, nontender and soft.  EXTREMITIES:  Without cyanosis or clubbing. There is trace edema.   LABORATORY DATA:  Troponin negative.  Hemoglobin 15.6. Creatinine 1.0.  Hemoglobin 12.9.  EKG shows normal sinus rhythm with no acute changes.  Chest x-ray is pending.   IMPRESSION:  1.  Somewhat atypical chest pain but history of coronary artery disease.      Some qualities of this discomfort is indeed atypical including spreading      location and nonexertional nature. With both risk factors and prior      history, needs cardiac evaluation.  2.  Diabetes mellitus.  3.  Hypertension.  4.  Glaucoma and corneal disease. He is due for a corneal transplant next      Tuesday at [facility].  5.  History of epidural abscess.      Theressa Millard, M.D.  Electronically Signed     JO/MEDQ  D:  05/29/2006  T:  05/29/2006  Job:  [id number]

## 2011-05-09 NOTE — Op Note (Signed)
[place]. [facility]  Patient:    [name], [name] Visit Number: [id number] MRN: [id number]          Service Type: [facility] Location: [phone] Attending Physician:  Faith Rogue T Dictated by:   Reinaldo Meeker, M.D. Proc. Date: 08/11/01 Admit Date:  08/19/2001 Discharge Date: 09/03/2001                             Operative Report  PREOPERATIVE DIAGNOSIS:  Cervical epidural abscess.  POSTOPERATIVE DIAGNOSIS:  Cervical epidural abscess.  PROCEDURE:  C3-C5 cervical laminectomy with evacuation of epidural abscess.  SURGEON:  Reinaldo Meeker, M.D.  ASSISTANT:  Julio Sicks, M.D.  PROCEDURE IN DETAIL:  After being placed in the prone position, the patients neck was prepped and draped in the usual sterile fashion.  A midline incision was made above the spinous processes of C2, 3, 4, and 5.  Using Bovie ______ , the incision was carried down to the spinous processes.  Subparietal ______ was then carried out bilaterally on the spinous processes and lamina.  Then a McCullough self-retaining retractor was placed for exposure.  A second x-ray showed approach at the appropriate levels.  The spinous processes of C3, C4, and C5 were removed.  A high-speed drill was used to thin out the lamina and then generous laminectomy was performed at C3, C4, and C5.  No liquified pus could be identified but there was inflammatory infectious granulation tissue along the thecal sac, and this was scraped away to decompress the underlying spinal dura.  Foraminal decompression was carried out as well towards the patients left symptomatic side at C4-5.  At this point, inspection was carried out in all directions.  No further compression could be identified. Large amounts of irrigation were carried out with a pressure washer and antibiotic irrigation.  At this point any bleeding was controlled with bipolar coagulation.  A ______ was then left in the epidural space and brought  out through a separate stab wound incision.  The wound was then closed using interrupted Vicryl and the muscle, fascia, subcutaneous and subcuticular tissues and Dermabond and Steri-Strips on the skin.  Sterile dressing was then applied.  The patient was extubated and taken to the recovery room in stable condition. Dictated by:   Reinaldo Meeker, M.D. Attending Physician:  Faith Rogue T DD:  10/14/01 TD:  10/15/01 Job: [id number]

## 2011-05-09 NOTE — Discharge Summary (Signed)
[place]. [facility]  Patient:    [name], [name] Visit Number: [id number] MRN: [id number]          Service Type: [facility] Location: 4100 [id number] Attending Physician:  Faith Rogue T Dictated by:   Reinaldo Meeker, M.D. Admit Date:  08/19/2001 Disc. Date: 08/19/01                             Discharge Summary  PRINCIPAL DIAGNOSIS:  Lumbar epidural abscess and cervical epidural abscess.  HISTORY OF PRESENT ILLNESS:  [name] is a 60 year old gentleman who is six weeks status post lumbar surgery.  His medical situation is complicated by a history of coronary artery disease and diabetes.  He was doing well until the week of admission when he had increasing pain.  He was tried on some anti-inflammatory medication without relief.  He then came to the emergency room.  On the day of admission was found to have draining wound.  An MRI scan was obtained which showed wound infection, lumbar epidural abscess.  The patient was admitted, taken to the operating room on 8/17, where he underwent exploration of his wound, opening irrigation, and drainage.  The patient tolerated the procedure well.  Had marked improvement in his low back pain. Approximately two days later he began to develop some left upper extremity pain with increasing neck pain, and studies were obtained which showed a cervical epidural abscess.  The patient was therefore taken to the operating room at that time where he underwent a C3-4 and C5 laminectomy with drainage of his cervical epidural abscess which he tolerated well.  Over subsequent days, the patient became afebrile and began to have increasing strength and decreasing pain.  His cervical wound healed without incident.  His lumbar incisions still had some drainage.  It was elected on 8/29, the patient have the patient go to rehabilitation for aggressive inpatient rehabilitation. Plan was to remove some staples and begin some supervisional  wound packing in his lumbar spine at this time.  His condition was markedly improved versus admission.Dictated by:   Reinaldo Meeker, M.D. Attending Physician:  Faith Rogue T DD:  08/19/01 TD:  08/19/01 Job: [id number]

## 2011-05-09 NOTE — Op Note (Signed)
[place]. [facility]  Patient:    [name], [name]                        MRN: [id number] Proc. Date: 08/07/01 Adm. Date:  [id number] Attending:  Gerald Dexter                           Operative Report  PREOPERATIVE DIAGNOSIS:  Lumbar wound infection.  POSTOPERATIVE DIAGNOSIS:  Lumbar wound infection.  PROCEDURE:  Incision and drainage with irrigation of lumbar wound infection.  SURGEON:  Reinaldo Meeker, M.D.  DESCRIPTION OF PROCEDURE:  After being placed in the prone position, the patients back was prepped and draped in the usual sterile fashion.  The previous lumbar incision was opened and carried down to the fascia.  Pockets of purulent material were encountered, and cultures were sent.  The incision was carried down to the spinous processes and previous subperiosteal dissection along the right side of the spinous processes and laminae, was reopened, and self-retaining retractor was placed for exposure.  Some granulation tissue around the thecal sac was scraped away.  Some attempts to go in front of the thecal sac were carried out, but no obvious purulent material was encountered.  Any abnormal-looking tissue was removed until the laminotomy was easily identified.  A large amount of irrigation under a pressure washer was then carried out, followed by antibiotic irrigation.  A Hemovac drain was brought out through a separate stab wound incision and left in the epidural space.  The wound was then closed using interrupted Vicryl in the muscle, fascia, subcutaneous, subcuticular tissues, and staples on the skin.  Sterile dressing was then applied.  The patient was extubated and taken to the recovery room in stable condition. DD:  08/07/01 TD:  08/09/01 Job: [id number]

## 2011-05-09 NOTE — Discharge Summary (Signed)
[name], [name] NO.:  [id number]   MEDICAL RECORD NO.:  [id number]          PATIENT TYPE:  INP   LOCATION:  3732                         FACILITY:  [facility]   PHYSICIAN:  Thora Lance, M.D.  DATE OF BIRTH:  11-22-1951   DATE OF ADMISSION:  11/17/2007  DATE OF DISCHARGE:  11/20/2007                               DISCHARGE SUMMARY   HISTORY OF PRESENT ILLNESS:  This is a 60 year old black male with past  medical history of coronary artery disease, diabetes, hypertension and  peptic ulcer disease who presented with chest pain on mild exertion over  the week prior to admission.  On the day of admission, he had chest pain  while he walked to his car associated nausea and vomiting.  He has a  past medical history of coronary artery disease with a cardiac  catheterization in June 2007 revealing moderately severe right coronary  artery disease, diffuse and not amenable to PCI  and mild to moderate  LAD disease.   SIGNIFICANT FINDINGS:  VITAL SIGNS:  Temperature 99.0, blood pressure  128/69, heart rate 95, respirations 18.  NECK:  No JVD.  LUNGS:  Clear.  HEART:  Regular rate and rhythm without murmur, gallop, or rub.  ABDOMEN:  Soft, obese, bowel sounds present, nontender, nondistended.  EXTREMITIES:  No edema   LABORATORY:  WBC 5.9, hemoglobin 0.1, platelet count 247. Sodium 132,  potassium 4.2, chloride 106, BUN 29, glucose 362, creatinine 1.0.   Chest x-ray:  No acute cardiopulmonary disease.   EKG:  Normal sinus rhythm without significant ST or T-wave changes.   Initial cardiac enzymes were negative.   HOSPITAL COURSE:  CHEST PAIN:  The patient was admitted with chest pain, possible unstable  angina.  He was treated medically.  He was seen by cardiology who  performed a cardiac catheterization on November 28 which showed an LAD  with distal 80% apical lesion, circumflex proximal 95% stenosis, and RCA  proximal 95% stenosis..  The patient underwent PTCA  and stent placement  to the left circumflex artery.  He tolerated this well without side  effects.   DISCHARGE DIAGNOSES:  1. Unstable angina.  2. Coronary artery disease.  3. Diabetes mellitus.  4. Hypertension.  5. Peptic ulcer disease.  6. Glaucoma.   PROCEDURES:  Cardiac catheterization.   DISCHARGE MEDICATIONS:  1. Diamox 500 mg b.i.d.  2. Lantus 80 units subcutaneously daily.  3. Toprol XL100 mg daily.  4. Lisinopril 20 mg daily.  5. Caduet 5/10 daily.  6. Aspirin 81 mg daily.  7. Avandia 8 mg daily.  8. Amaryl 2 mg daily.  9. Omnipred1 drop right eye 4 times a day.  10.Medroxyprogesterone acetate 1% solution right eye 4 times a day.  11.Vancomycin 1 drop right eye 3 times a day.  12.Vigamox 0.5% solution right eye 3 times a day.  13.Plavix 75 mg daily.  14.Aspirin 325 mg a day   DISPOSITION:  Discharged home.   FOLLOWUP:  Follow up in 1-2 weeks, Dr. Valentina Lucks; also [facility].   DIET:  Low-sodium diabetic diet.  ACTIVITY:  As tolerated.           ______________________________  Thora Lance, M.D.     JJG/MEDQ  D:  01/25/2008  T:  01/26/2008  Job:  [id number]

## 2011-05-26 ENCOUNTER — Encounter: Payer: Self-pay | Admitting: Internal Medicine

## 2011-05-26 ENCOUNTER — Ambulatory Visit (INDEPENDENT_AMBULATORY_CARE_PROVIDER_SITE_OTHER): Payer: Medicare Other | Admitting: Internal Medicine

## 2011-05-26 ENCOUNTER — Other Ambulatory Visit (INDEPENDENT_AMBULATORY_CARE_PROVIDER_SITE_OTHER): Payer: Medicare Other

## 2011-05-26 DIAGNOSIS — E875 Hyperkalemia: Secondary | ICD-10-CM

## 2011-05-26 DIAGNOSIS — N529 Male erectile dysfunction, unspecified: Secondary | ICD-10-CM

## 2011-05-26 DIAGNOSIS — E669 Obesity, unspecified: Secondary | ICD-10-CM

## 2011-05-26 DIAGNOSIS — I10 Essential (primary) hypertension: Secondary | ICD-10-CM

## 2011-05-26 DIAGNOSIS — E119 Type 2 diabetes mellitus without complications: Secondary | ICD-10-CM

## 2011-05-26 DIAGNOSIS — R252 Cramp and spasm: Secondary | ICD-10-CM

## 2011-05-26 DIAGNOSIS — Z125 Encounter for screening for malignant neoplasm of prostate: Secondary | ICD-10-CM

## 2011-05-26 LAB — COMPREHENSIVE METABOLIC PANEL
Albumin: 3.7 g/dL (ref 3.5–5.2)
Alkaline Phosphatase: 39 U/L (ref 39–117)
BUN: 19 mg/dL (ref 6–23)
CO2: 23 mEq/L (ref 19–32)
GFR: 94.18 mL/min (ref >60.00)
Glucose, Bld: 131 mg/dL — ABNORMAL HIGH (ref 70–99)
Potassium: 4.1 mEq/L (ref 3.5–5.1)
Total Bilirubin: 0.5 mg/dL (ref 0.3–1.2)

## 2011-05-26 LAB — HEMOGLOBIN A1C: Hgb A1c MFr Bld: 8.3 % — ABNORMAL HIGH (ref 4.6–6.5)

## 2011-05-26 LAB — CBC WITH DIFFERENTIAL/PLATELET
Eosinophils Relative: 3.6 % (ref 0.0–5.0)
HCT: 36.1 % — ABNORMAL LOW (ref 39.0–52.0)
Lymphs Abs: 1.3 10*3/uL (ref 0.7–4.0)
MCV: 74.1 fl — ABNORMAL LOW (ref 78.0–100.0)
Monocytes Absolute: 0.3 10*3/uL (ref 0.1–1.0)
Platelets: 216 10*3/uL (ref 150.0–400.0)
WBC: 4.8 10*3/uL (ref 4.5–10.5)

## 2011-05-26 LAB — PSA: PSA: 1.12 ng/mL (ref 0.10–4.00)

## 2011-05-26 LAB — LIPID PANEL
Cholesterol: 189 mg/dL (ref 0–200)
HDL: 29.9 mg/dL — ABNORMAL LOW (ref >39.00)
Triglycerides: 165 mg/dL — ABNORMAL HIGH (ref 0.0–149.0)

## 2011-05-26 NOTE — Assessment & Plan Note (Signed)
Check his K+ level today

## 2011-05-26 NOTE — Assessment & Plan Note (Signed)
I will send him for a consult to see if he is a candidate for lap-band

## 2011-05-26 NOTE — Progress Notes (Signed)
Subjective:    Patient ID: [name], male    DOB: November 14, 1951, 60 y.o.   MRN: [id number]  Diabetes He presents for his follow-up diabetic visit. He has type 2 diabetes mellitus. His disease course has been stable. There are no hypoglycemic associated symptoms. Pertinent negatives for hypoglycemia include no confusion, dizziness, headaches, nervousness/anxiousness, pallor, seizures, speech difficulty or tremors. Pertinent negatives for diabetes include no blurred vision, no chest pain, no fatigue, no foot paresthesias, no foot ulcerations, no polydipsia, no polyphagia, no polyuria, no visual change, no weakness and no weight loss. There are no hypoglycemic complications. Symptoms are stable. There are no diabetic complications. Current diabetic treatment includes intensive insulin program and oral agent (dual therapy). He is compliant with treatment all of the time. His weight is increasing steadily. He is following a generally healthy diet. Meal planning includes avoidance of concentrated sweets. He never participates in exercise. His home blood glucose trend is decreasing steadily. His breakfast blood glucose range is generally 90-110 mg/dl. His lunch blood glucose range is generally 90-110 mg/dl. His dinner blood glucose range is generally 90-110 mg/dl. His highest blood glucose is 110-130 mg/dl. His overall blood glucose range is 90-110 mg/dl. An ACE inhibitor/angiotensin II receptor blocker is being taken. He sees a podiatrist.Eye exam is current.  Erectile Dysfunction This is a new problem. The current episode started more than 1 month ago. The problem is unchanged. The nature of his difficulty is achieving erection, maintaining erection and penetration. Non-physiologic factors contributing to erectile dysfunction are a decreased libido. He reports no anxiety or performance anxiety. He reports his erection duration to be 1 to 5 minutes. Irritative symptoms do not include frequency, nocturia or  urgency. Obstructive symptoms do not include dribbling, incomplete emptying, an intermittent stream, a slower stream, straining or a weak stream. Pertinent negatives include no chills, dysuria, genital pain, hematuria, hesitancy or inability to urinate. The symptoms are aggravated by medications. Past treatments include nothing. Risk factors include diabetes mellitus and hypertension.      Review of Systems  Constitutional: Negative for fever, chills, weight loss, diaphoresis, activity change, appetite change, fatigue and unexpected weight change.  HENT: Negative for facial swelling, trouble swallowing, neck pain, neck stiffness and voice change.   Eyes: Negative for blurred vision, photophobia and visual disturbance.  Respiratory: Negative for cough, chest tightness, shortness of breath, wheezing and stridor.   Cardiovascular: Negative for chest pain, palpitations and leg swelling.  Gastrointestinal: Negative for nausea, vomiting, abdominal pain, diarrhea, constipation and blood in stool.  Genitourinary: Positive for decreased libido. Negative for dysuria, hesitancy, urgency, polyuria, frequency, hematuria, flank pain, decreased urine volume, discharge, penile swelling, scrotal swelling, enuresis, difficulty urinating, genital sores, penile pain, testicular pain, incomplete emptying and nocturia.  Musculoskeletal: Negative for myalgias, back pain, joint swelling, arthralgias and gait problem.  Skin: Negative for color change, pallor and rash.  Neurological: Negative for dizziness, tremors, seizures, syncope, facial asymmetry, speech difficulty, weakness, light-headedness, numbness and headaches.  Hematological: Negative for polydipsia, polyphagia and adenopathy. Does not bruise/bleed easily.  Psychiatric/Behavioral: Negative for suicidal ideas, hallucinations, behavioral problems, confusion, sleep disturbance, self-injury, dysphoric mood, decreased concentration and agitation. The patient is not  nervous/anxious and is not hyperactive.        Objective:   Physical Exam  [vitalsreviewed. Constitutional: He is oriented to person, place, and time. He appears well-developed and well-nourished. No distress.  HENT:  Head: Normocephalic and atraumatic.  Right Ear: External ear normal.  Left Ear: External ear normal.  Nose: Nose normal.  Mouth/Throat: Oropharynx is clear and moist. No oropharyngeal exudate.  Eyes: Conjunctivae and EOM are normal. Pupils are equal, round, and reactive to light. Right eye exhibits no discharge. Left eye exhibits no discharge. No scleral icterus.  Neck: Normal range of motion. Neck supple. No JVD present. No tracheal deviation present. No thyromegaly present.  Cardiovascular: Normal rate, regular rhythm, normal heart sounds and intact distal pulses.  Exam reveals no gallop and no friction rub.   No murmur heard. Pulmonary/Chest: Effort normal and breath sounds normal. No stridor. No respiratory distress. He has no wheezes. He has no rales. He exhibits no tenderness.  Abdominal: Soft. Bowel sounds are normal. He exhibits no distension and no mass. There is no tenderness. There is no rebound and no guarding.  Musculoskeletal: Normal range of motion. He exhibits no edema and no tenderness.  Lymphadenopathy:    He has no cervical adenopathy.  Neurological: He is alert and oriented to person, place, and time. He has normal reflexes. He displays normal reflexes. No cranial nerve deficit. He exhibits normal muscle tone. Coordination normal.  Skin: Skin is warm and dry. No rash noted. He is not diaphoretic. No erythema. No pallor.  Psychiatric: He has a normal mood and affect. His behavior is normal. Judgment and thought content normal.          Assessment & Plan:

## 2011-05-26 NOTE — Assessment & Plan Note (Signed)
I will check his labs today 

## 2011-05-26 NOTE — Assessment & Plan Note (Signed)
I will check labs to look for secondary causes

## 2011-05-26 NOTE — Assessment & Plan Note (Signed)
His BP is well controlled, I will check his lytes and renal function today 

## 2011-05-26 NOTE — Assessment & Plan Note (Signed)
A1C and other labs ordered today to monitor for efficacy of hiss treatment

## 2011-05-26 NOTE — Patient Instructions (Signed)
Diabetes, Type 2 Diabetes is a lasting (chronic) disease. In type 2 diabetes, the pancreas does not make enough insulin (a hormone), and the body does not respond normally to the insulin that is made. This type of diabetes was also previously called adult onset diabetes. About 90% of all those who have diabetes have type 2. It usually occurs after the age of 40 but can occur at any age. CAUSES Unlike type 1 diabetes, which happens because insulin is no longer being made, type 2 diabetes happens because the body is making less insulin and has trouble using the insulin properly. SYMPTOMS  Drinking more than usual.   Urinating more than usual.   Blurred vision.   Dry, itchy skin.   Frequent infection like yeast infections in women.   More tired than usual (fatigue).  TREATMENT  Healthy eating.   Exercise.   Medication, if needed.   Monitoring blood glucose (sugar).   Seeing your caregiver regularly.  HOME CARE INSTRUCTIONS  Check your blood glucose (sugar) at least once daily. More frequent monitoring may be necessary, depending on your medications and on how well your diabetes is controlled. Your caregiver will advise you.   Take your medicine as directed by your caregiver.   Do not smoke.   Make wise food choices. Ask your caregiver for information. Weight loss can improve your diabetes.   Learn about low blood glucose (hypoglycemia) and how to treat it.   Get your eyes checked regularly.   Have a yearly physical exam. Have your blood pressure checked. Get your blood and urine tested.   Wear a pendant or bracelet saying that you have diabetes.   Check your feet every night for sores. Let your caregiver know if you have sores that are not healing.  SEEK MEDICAL CARE IF:  You are having problems keeping your blood glucose at target range.   You feel you might be having problems with your medicines.   You have symptoms of an illness that is not improving after 24  hours.   You have a sore or wound that is not healing.   You notice a change in vision or a new problem with your vision.   You develop a fever of more than 100.5.  Document Released: 12/08/2005 Document Re-Released: 12/30/2009 [employer] Patient Information 2011 [employer]. 

## 2011-05-27 ENCOUNTER — Encounter: Payer: Self-pay | Admitting: Internal Medicine

## 2011-05-27 LAB — TESTOSTERONE, FREE, TOTAL, SHBG
Sex Hormone Binding: 25 nmol/L (ref 13–71)
Testosterone: 209.45 ng/dL — ABNORMAL LOW (ref 250–890)

## 2011-07-31 ENCOUNTER — Encounter: Payer: Medicare Other | Attending: Internal Medicine | Admitting: Dietician

## 2011-07-31 DIAGNOSIS — E119 Type 2 diabetes mellitus without complications: Secondary | ICD-10-CM | POA: Insufficient documentation

## 2011-07-31 DIAGNOSIS — Z713 Dietary counseling and surveillance: Secondary | ICD-10-CM | POA: Insufficient documentation

## 2011-07-31 NOTE — Progress Notes (Signed)
  Medical Nutrition Therapy:  Appt start time: 1500 end time:  1630.   Assessment:  Primary concerns today: Today, he acknowledged his diabetes and the fact that he is not controlling it. He noted he is taking many diabetes medications and that these are very expensive.  His primary concern was to obtain information regarding having bariatric surgery.  He has decided that this [name] be the best approach to weight loss and helping to control his blood glucose and to decrease his diabetes medication needs.  He and his wife had a number of questions regarding the process and where he needed to start.  I provided him with the date and time for the bariatric surgery information session at [facility].  We discussed the usual pre-op insurance demands, the physical/dietary changes both pre and post-op.  His glucose is remaining high but he has lost 38 lb on his own trying to get better glucose control.     MEDICATIONS: Review of current medications reveals a number of DM 2 medications as well as meds for the glaucoma, HTN , and CAD.   DIETARY INTAKE:  Usual eating pattern includes 2 meals and 0-1 snacks per day.  24-hr recall:  B (100:00 AM): Fried egg (1), sausage patty (1), =/- 1/2 cup grits,( if he does not have grits, he will have a serving of fruit), Coffee (sweet and low and creamer). L (2:00 PM): Sandwich with bread (2 slices of ww), protein ([place], tuna, egg salad), lettuce, tomato, mayo.  He [name] have a serving of fruit at lunch. D (7:30-8:00 PM): Protein (salmon, chicken, shell fish), starch (green peas, scalloped potatoes Snk (9-10:00 PM): Last evening he had a slice of cake instead of the fruit.  Usual physical activity: Very limited.  Suffers with marked visual loss, to the point of probably being legally blind.  His numerous back and eye surgeries have limited his activity and range of motion at times.  Currently not exercising.  He feels that the best exercise for him is the stationary bike.   We reviewed the need to use his [employer] pass for use of the stationary bike.  He might consider water exercise also.  We reviewed some chair exercises that he could do on days that he does not go to the [facility].  Recommended Bike 20 minutes/week and to start low and slow.  Estimated energy needs:1800-1700 calories 195-200 g carbohydrates 130-135 g protein 46-48 g fat  Progress Towards Goal(s):  In progress.   Nutritional Diagnosis:  [name]-2.1 Inpaired nutrition utilization As related to glucose.  As evidenced by history of DM 2 and reported HgA1C of 9%.    Intervention:  Nutrition: Review of the food groups and carbohydrate sources.  Recommended increasing lean protein and to avoid frying and fatty foods.  Recommended increasing his intake of the non-starchy vegetables.  Substitute fruit instead of cake and other sweets.  Limit starchy carbs to 2 servings or 1 cup at lunch and dinner.  He needs to use more beans and higher fiber sources of carbohydrates.  Monitoring/Evaluation:  Dietary intake, exercise, blood glucose, exploration of bariatric surgery, and body weight in 6-8 weeks.

## 2011-07-31 NOTE — Patient Instructions (Addendum)
   Check blood glucose  in the morning before food or drink (fasting).  Check 2 hours after the first bite of your largest meal of the day.  Try for 3 times per week.  Goal is 180 or less.  Try to do the chair exercises on the days that you do not go to the [facility].  Use the [employer] Program at the [facility] for riding the stationary bike.  Start Low and Slow with the exercise bike.  Aim for 3 days per week and 20 minutes of riding.  Check out the Bariatric Surgery orientation session.  Limit starch to 1-2 servings per meal, keep at 1 cup at most per meal.  Use more of the beans.  Increase your non-starchy vegetables to 1-2 large servings per meal  Use lean meat/protein at every meal and snack.

## 2011-09-26 LAB — COMPREHENSIVE METABOLIC PANEL
AST: 19 U/L (ref 0–37)
Albumin: 3 g/dL — ABNORMAL LOW (ref 3.5–5.2)
Alkaline Phosphatase: 73 U/L (ref 39–117)
BUN: 18 mg/dL (ref 6–23)
CO2: 19 mEq/L (ref 19–32)
CO2: 19 mEq/L (ref 19–32)
Calcium: 8.5 mg/dL (ref 8.4–10.5)
Chloride: 109 mEq/L (ref 96–112)
Creatinine, Ser: 1.16 mg/dL (ref 0.4–1.5)
Creatinine, Ser: 1.2 mg/dL (ref 0.4–1.5)
GFR calc Af Amer: >60 mL/min (ref >60)
GFR calc non Af Amer: >60 mL/min (ref >60)
GFR calc non Af Amer: >60 mL/min (ref >60)
Glucose, Bld: 261 mg/dL — ABNORMAL HIGH (ref 70–99)
Glucose, Bld: 302 mg/dL — ABNORMAL HIGH (ref 70–99)
Potassium: 3.7 mEq/L (ref 3.5–5.1)
Total Bilirubin: 0.5 mg/dL (ref 0.3–1.2)
Total Protein: 6 g/dL (ref 6.0–8.3)

## 2011-09-26 LAB — BLOOD GAS, ARTERIAL
Acid-base deficit: 6.9 mmol/L — ABNORMAL HIGH (ref 0.0–2.0)
O2 Content: 2 L/min
O2 Saturation: 98.6 %
pCO2 arterial: 33.7 mmHg — ABNORMAL LOW (ref 35.0–45.0)
pO2, Arterial: 117 mmHg — ABNORMAL HIGH (ref 80.0–100.0)

## 2011-09-26 LAB — DIFFERENTIAL
Basophils Absolute: 0 10*3/uL (ref 0.0–0.1)
Basophils Absolute: 0.1 10*3/uL (ref 0.0–0.1)
Basophils Relative: 1 % (ref 0–1)
Basophils Relative: 1 % (ref 0–1)
Eosinophils Absolute: 0.2 10*3/uL (ref 0.0–0.7)
Eosinophils Absolute: 0.3 10*3/uL (ref 0.0–0.7)
Eosinophils Relative: 3 % (ref 0–5)
Lymphs Abs: 1.3 10*3/uL (ref 0.7–4.0)
Neutro Abs: 4 10*3/uL (ref 1.7–7.7)
Neutrophils Relative %: 59 % (ref 43–77)

## 2011-09-26 LAB — CBC
HCT: 40 % (ref 39.0–52.0)
Hemoglobin: 12.7 g/dL — ABNORMAL LOW (ref 13.0–17.0)
MCHC: 31.7 g/dL (ref 30.0–36.0)
MCHC: 31.7 g/dL (ref 30.0–36.0)
MCHC: 32 g/dL (ref 30.0–36.0)
MCV: 72.8 fL — ABNORMAL LOW (ref 78.0–100.0)
MCV: 73.8 fL — ABNORMAL LOW (ref 78.0–100.0)
Platelets: 256 10*3/uL (ref 150–400)
RBC: 5.22 MIL/uL (ref 4.22–5.81)
RBC: 5.37 MIL/uL (ref 4.22–5.81)
RDW: 15.7 % — ABNORMAL HIGH (ref 11.5–15.5)
RDW: 15.7 % — ABNORMAL HIGH (ref 11.5–15.5)
RDW: 16.2 % — ABNORMAL HIGH (ref 11.5–15.5)
WBC: 5.9 10*3/uL (ref 4.0–10.5)

## 2011-09-26 LAB — BASIC METABOLIC PANEL
BUN: 18 mg/dL (ref 6–23)
Chloride: 107 mEq/L (ref 96–112)
Creatinine, Ser: 1.08 mg/dL (ref 0.4–1.5)
Glucose, Bld: 413 mg/dL — ABNORMAL HIGH (ref 70–99)

## 2011-09-26 LAB — URINALYSIS, ROUTINE W REFLEX MICROSCOPIC
Bilirubin Urine: NEGATIVE
Glucose, UA: >1000 mg/dL — AB
Hgb urine dipstick: NEGATIVE
Ketones, ur: NEGATIVE mg/dL
Protein, ur: NEGATIVE mg/dL
Urobilinogen, UA: 1 mg/dL (ref 0.0–1.0)

## 2011-09-26 LAB — GLUCOSE, CAPILLARY
Glucose-Capillary: 271 mg/dL — ABNORMAL HIGH (ref 70–99)
Glucose-Capillary: 326 mg/dL — ABNORMAL HIGH (ref 70–99)

## 2011-09-26 LAB — CK TOTAL AND CKMB (NOT AT ARMC)
CK, MB: 2.1 ng/mL (ref 0.3–4.0)
CK, MB: 2.4 ng/mL (ref 0.3–4.0)
Relative Index: 1.3 (ref 0.0–2.5)
Total CK: 162 U/L (ref 7–232)

## 2011-09-26 LAB — TROPONIN I
Troponin I: 0.02 ng/mL (ref 0.00–0.06)
Troponin I: 0.03 ng/mL (ref 0.00–0.06)

## 2011-09-26 LAB — URINE CULTURE
Colony Count: NO GROWTH
Culture: NO GROWTH
Special Requests: NEGATIVE

## 2011-09-26 LAB — POCT CARDIAC MARKERS

## 2011-09-26 LAB — LIPID PANEL
Cholesterol: 249 mg/dL — ABNORMAL HIGH (ref 0–200)
LDL Cholesterol: UNDETERMINED mg/dL (ref 0–99)

## 2011-09-26 LAB — URINE MICROSCOPIC-ADD ON

## 2011-09-29 ENCOUNTER — Ambulatory Visit: Payer: Medicare Other | Admitting: Internal Medicine

## 2011-09-30 ENCOUNTER — Telehealth: Payer: Self-pay

## 2011-09-30 ENCOUNTER — Ambulatory Visit: Payer: Medicare Other | Admitting: Internal Medicine

## 2011-09-30 LAB — CBC
HCT: 31.7 — ABNORMAL LOW
Hemoglobin: 10.4 — ABNORMAL LOW
Hemoglobin: 11.1 — ABNORMAL LOW
Platelets: 242
RBC: 4.68
RDW: 15.1
RDW: 15.2
WBC: 5.4
WBC: 5.6

## 2011-09-30 LAB — POCT CARDIAC MARKERS
CKMB, poc: <1 — ABNORMAL LOW
Operator id: 146091
Operator id: 146091
Troponin i, poc: <0.05
Troponin i, poc: <0.05

## 2011-09-30 LAB — BASIC METABOLIC PANEL
BUN: 29 — ABNORMAL HIGH
Calcium: 8.4
Chloride: 103
GFR calc Af Amer: >60
GFR calc non Af Amer: >60
GFR calc non Af Amer: >60
Glucose, Bld: 178 — ABNORMAL HIGH
Potassium: 3.7
Potassium: 3.7
Sodium: 132 — ABNORMAL LOW
Sodium: 133 — ABNORMAL LOW

## 2011-09-30 LAB — CARDIAC PANEL(CRET KIN+CKTOT+MB+TROPI)
CK, MB: 1
CK, MB: 1.2
CK, MB: 1.3
Relative Index: 0.9
Relative Index: 1
Relative Index: 1.1
Total CK: 112
Total CK: 117
Troponin I: 0.04

## 2011-09-30 LAB — HEPATIC FUNCTION PANEL
ALT: 22
AST: 23
Albumin: 3.4 — ABNORMAL LOW
Bilirubin, Direct: 0.1
Total Protein: 6.1

## 2011-09-30 LAB — PROTIME-INR: INR: 0.9

## 2011-09-30 LAB — I-STAT 8, (EC8 V) (CONVERTED LAB)
Acid-base deficit: 9 — ABNORMAL HIGH
Glucose, Bld: 362 — ABNORMAL HIGH
TCO2: 18
pCO2, Ven: 35.2 — ABNORMAL LOW
pH, Ven: 7.292

## 2011-09-30 LAB — DIFFERENTIAL
Basophils Absolute: 0
Lymphocytes Relative: 20
Lymphs Abs: 1.1
Monocytes Absolute: 0.3
Neutro Abs: 4.1

## 2011-09-30 LAB — POCT I-STAT CREATININE
Creatinine, Ser: 1
Operator id: 146091

## 2011-09-30 MED ORDER — "INSULIN SYRINGE-NEEDLE U-100 28G X 1/2"" 1 ML MISC": Status: DC

## 2011-09-30 NOTE — Telephone Encounter (Signed)
Other lmovm requesting rx refill for syringes. RX sent to pharmacy, returned call to patient and # on file D/c

## 2011-10-28 ENCOUNTER — Telehealth: Payer: Self-pay

## 2011-10-28 MED ORDER — "INSULIN SYRINGE-NEEDLE U-100 28G X 1/2"" 1 ML MISC": Status: DC

## 2011-10-28 NOTE — Telephone Encounter (Signed)
Wife stopped by stating that she requested refill on syringes x 1 mo. Wife advised that rx was sent 09/30/11 and will reprint and given a written script today. Written rx provided

## 2011-11-11 ENCOUNTER — Other Ambulatory Visit: Payer: Self-pay | Admitting: Internal Medicine

## 2012-01-22 ENCOUNTER — Other Ambulatory Visit: Payer: Self-pay | Admitting: Internal Medicine

## 2012-02-23 ENCOUNTER — Other Ambulatory Visit: Payer: Self-pay | Admitting: Internal Medicine

## 2012-02-25 ENCOUNTER — Other Ambulatory Visit: Payer: Self-pay | Admitting: Internal Medicine

## 2012-03-01 ENCOUNTER — Other Ambulatory Visit: Payer: Self-pay | Admitting: Internal Medicine

## 2012-03-02 ENCOUNTER — Other Ambulatory Visit: Payer: Self-pay | Admitting: Internal Medicine

## 2012-03-11 LAB — HM DIABETES EYE EXAM

## 2012-03-29 ENCOUNTER — Other Ambulatory Visit: Payer: Self-pay | Admitting: Internal Medicine

## 2012-03-31 ENCOUNTER — Ambulatory Visit (INDEPENDENT_AMBULATORY_CARE_PROVIDER_SITE_OTHER): Payer: Medicare Other | Admitting: Internal Medicine

## 2012-03-31 ENCOUNTER — Other Ambulatory Visit: Payer: Self-pay | Admitting: Internal Medicine

## 2012-03-31 ENCOUNTER — Encounter: Payer: Self-pay | Admitting: Internal Medicine

## 2012-03-31 ENCOUNTER — Other Ambulatory Visit (INDEPENDENT_AMBULATORY_CARE_PROVIDER_SITE_OTHER): Payer: Medicare Other

## 2012-03-31 VITALS — BP 130/64 | HR 68 | Temp 98.1°F | Resp 16 | Wt 253.8 lb

## 2012-03-31 DIAGNOSIS — R0600 Dyspnea, unspecified: Secondary | ICD-10-CM | POA: Insufficient documentation

## 2012-03-31 DIAGNOSIS — R0609 Other forms of dyspnea: Secondary | ICD-10-CM

## 2012-03-31 DIAGNOSIS — I10 Essential (primary) hypertension: Secondary | ICD-10-CM

## 2012-03-31 DIAGNOSIS — I2510 Atherosclerotic heart disease of native coronary artery without angina pectoris: Secondary | ICD-10-CM

## 2012-03-31 DIAGNOSIS — N400 Enlarged prostate without lower urinary tract symptoms: Secondary | ICD-10-CM | POA: Insufficient documentation

## 2012-03-31 DIAGNOSIS — Z0000 Encounter for general adult medical examination without abnormal findings: Secondary | ICD-10-CM

## 2012-03-31 DIAGNOSIS — E119 Type 2 diabetes mellitus without complications: Secondary | ICD-10-CM

## 2012-03-31 DIAGNOSIS — D519 Vitamin B12 deficiency anemia, unspecified: Secondary | ICD-10-CM | POA: Insufficient documentation

## 2012-03-31 DIAGNOSIS — D510 Vitamin B12 deficiency anemia due to intrinsic factor deficiency: Secondary | ICD-10-CM

## 2012-03-31 DIAGNOSIS — I509 Heart failure, unspecified: Secondary | ICD-10-CM

## 2012-03-31 DIAGNOSIS — Z136 Encounter for screening for cardiovascular disorders: Secondary | ICD-10-CM

## 2012-03-31 DIAGNOSIS — E875 Hyperkalemia: Secondary | ICD-10-CM

## 2012-03-31 DIAGNOSIS — E7800 Pure hypercholesterolemia, unspecified: Secondary | ICD-10-CM

## 2012-03-31 DIAGNOSIS — R0989 Other specified symptoms and signs involving the circulatory and respiratory systems: Secondary | ICD-10-CM

## 2012-03-31 DIAGNOSIS — Z125 Encounter for screening for malignant neoplasm of prostate: Secondary | ICD-10-CM

## 2012-03-31 LAB — CBC WITH DIFFERENTIAL/PLATELET
Basophils Relative: 0.6 % (ref 0.0–3.0)
Eosinophils Absolute: 0.2 10*3/uL (ref 0.0–0.7)
MCHC: 32.1 g/dL (ref 30.0–36.0)
MCV: 73 fl — ABNORMAL LOW (ref 78.0–100.0)
Monocytes Absolute: 0.5 10*3/uL (ref 0.1–1.0)
Neutrophils Relative %: 60.2 % (ref 43.0–77.0)
RBC: 5.48 Mil/uL (ref 4.22–5.81)
RDW: 15 % — ABNORMAL HIGH (ref 11.5–14.6)

## 2012-03-31 LAB — PSA: PSA: 1.61 ng/mL (ref 0.10–4.00)

## 2012-03-31 LAB — LDL CHOLESTEROL, DIRECT: Direct LDL: 150.8 mg/dL

## 2012-03-31 LAB — URINALYSIS, ROUTINE W REFLEX MICROSCOPIC
Nitrite: NEGATIVE
Total Protein, Urine: NEGATIVE
pH: 6.5 (ref 5.0–8.0)

## 2012-03-31 LAB — FOLATE: Folate: 16.2 ng/mL (ref >5.9)

## 2012-03-31 LAB — COMPREHENSIVE METABOLIC PANEL
AST: 15 U/L (ref 0–37)
Albumin: 3.9 g/dL (ref 3.5–5.2)
Alkaline Phosphatase: 45 U/L (ref 39–117)
BUN: 16 mg/dL (ref 6–23)
Creatinine, Ser: 1 mg/dL (ref 0.4–1.5)
Potassium: 4.1 mEq/L (ref 3.5–5.1)

## 2012-03-31 LAB — LIPID PANEL
Cholesterol: 207 mg/dL — ABNORMAL HIGH (ref 0–200)
Triglycerides: 126 mg/dL (ref 0.0–149.0)

## 2012-03-31 LAB — FECAL OCCULT BLOOD, GUAIAC: Fecal Occult Blood: NEGATIVE

## 2012-03-31 NOTE — Patient Instructions (Signed)
Diabetes, Type 2 Diabetes is a long-lasting (chronic) disease. In type 2 diabetes, the pancreas does not make enough insulin (a hormone), and the body does not respond normally to the insulin that is made. This type of diabetes was also previously called adult-onset diabetes. It usually occurs after the age of 63, but it can occur at any age.  CAUSES  Type 2 diabetes happens because the pancreasis not making enough insulin or your body has trouble using the insulin that your pancreas does make properly. SYMPTOMS   Drinking more than usual.   Urinating more than usual.   Blurred vision.   Dry, itchy skin.   Frequent infections.   Feeling more tired than usual (fatigue).  DIAGNOSIS The diagnosis of type 2 diabetes is usually made by one of the following tests:  Fasting blood glucose test. You will not eat for at least 8 hours and then take a blood test.   Random blood glucose test. Your blood glucose (sugar) is checked at any time of the day regardless of when you ate.   Oral glucose tolerance test (OGTT). Your blood glucose is measured after you have not eaten (fasted) and then after you drink a glucose containing beverage.  TREATMENT   Healthy eating.   Exercise.   Medicine, if needed.   Monitoring blood glucose.   Seeing your caregiver regularly.  HOME CARE INSTRUCTIONS   Check your blood glucose at least once a day. More frequent monitoring may be necessary, depending on your medicines and on how well your diabetes is controlled. Your caregiver will advise you.   Take your medicine as directed by your caregiver.   Do not smoke.   Make wise food choices. Ask your caregiver for information. Weight loss can improve your diabetes.   Learn about low blood glucose (hypoglycemia) and how to treat it.   Get your eyes checked regularly.   Have a yearly physical exam. Have your blood pressure checked and your blood and urine tested.   Wear a pendant or bracelet saying  that you have diabetes.   Check your feet every night for cuts, sores, blisters, and redness. Let your caregiver know if you have any problems.  SEEK MEDICAL CARE IF:   You have problems keeping your blood glucose in target range.   You have problems with your medicines.   You have symptoms of an illness that do not improve after 24 hours.   You have a sore or wound that is not healing.   You notice a change in vision or a new problem with your vision.   You have a fever.  MAKE SURE YOU:  Understand these instructions.   Will watch your condition.   Will get help right away if you are not doing well or get worse.  Document Released: 12/08/2005 Document Revised: 11/27/2011 Document Reviewed: 05/26/2011 [facility] Patient Information 2012 [place], [place].Heart Failure Heart failure (HF) is a condition in which the heart has trouble pumping blood. This means your heart does not pump blood efficiently for your body to work well. In some cases of HF, fluid may back up into your lungs or you may have swelling (edema) in your lower legs. HF is a long-term (chronic) condition. It is important for you to take good care of yourself and follow your caregiver's treatment plan. CAUSES   Health conditions:   High blood pressure (hypertension) causes the heart muscle to work harder than normal. When pressure in the blood vessels is high, the  heart needs to pump (contract) with more force in order to circulate blood throughout the body. High blood pressure eventually causes the heart to become stiff and weak.   Coronary artery disease (CAD) is the buildup of cholesterol and fat (plaques) in the arteries of the heart. The blockage in the arteries deprives the heart muscle of oxygen and blood. This can cause chest pain and may lead to a heart attack. High blood pressure can also contribute to CAD.   Heart attack (myocardial infarction) occurs when 1 or more arteries in the heart become blocked. The  loss of oxygen damages the muscle tissue of the heart. When this happens, part of the heart muscle dies. The injured tissue does not contract as well and weakens the heart's ability to pump blood.   Abnormal heart valves can cause HF when the heart valves do not open and close properly. This makes the heart muscle pump harder to keep the blood flowing.   Heart muscle disease (cardiomyopathy or myocarditis) is damage to the heart muscle from a variety of causes. These can include drug or alcohol abuse, infections, or unknown reasons. These can increase the risk of HF.   Lung disease makes the heart work harder because the lungs do not work properly. This can cause a strain on the heart leading it to fail.   Diabetes increases the risk of HF. High blood sugar contributes to high fat (lipid) levels in the blood. Diabetes can also cause slow damage to tiny blood vessels that carry important nutrients to the heart muscle. When the heart does not get enough oxygen and food, it can cause the heart to become weak and stiff. This leads to a heart that does not contract efficiently.   Other diseases can contribute to HF. These include abnormal heart rhythms, thyroid problems, and low blood counts (anemia).   Unhealthy lifestyle habits:   Obesity.   Smoking.   Eating foods high in fat and cholesterol.   Eating or drinking beverages high in salt.   Drug or alcohol abuse.   Lack of exercise.  SYMPTOMS  HF symptoms may vary and can be hard to detect. Symptoms may include:  Shortness of breath with activity, such as climbing stairs.   Persistent cough.   Swelling of the feet, ankles, legs, or abdomen.   Unexplained weight gain.   Difficulty breathing when lying flat.   Waking from sleep because of the need to sit up and get more air.   Rapid heartbeat.   Fatigue and loss of energy.   Feeling lightheaded or close to fainting.  DIAGNOSIS  A diagnosis of HF is based on your history,  symptoms, physical examination, and diagnostic tests. Diagnostic tests for HF may include:  EKG.   Chest X-ray.   Blood tests.   Exercise stress test.   Blood oxygen test (arterial blood gas).   Evaluation by a heart doctor (cardiologist).   Ultrasound evaluation of the heart (echocardiogram).   Heart artery test to look for blockages (angiogram).   Radioactive imaging to look at the heart (radionuclide test).  TREATMENT  Treatment is aimed at managing the symptoms of HF. Medicines, lifestyle changes, or surgical intervention may be necessary to treat HF.  Medicines to help treat HF may include:   Angiotensin-converting enzyme (ACE) inhibitors. These block the effects of a blood protein called angiotensin-converting enzyme. ACE inhibitors relax (dilate) the blood vessels and help lower blood pressure. This decreases the workload of the heart, slows the  progression of HF, and improves symptoms.   Angiotensin receptor blockers (ARBs). These medications work similar to ACE inhibitors. ARBs may be an alternative for people who cannot tolerate an ACE inhibitor.   Aldosterone antagonists. This medication helps get rid of extra fluid from your body. This lowers the volume of blood the heart has to pump.   Water pills (diuretics). Diuretics cause the kidneys to remove salt and water from the blood. The extra fluid is removed by urination. By removing extra fluid from the body, diuretics help lower the workload of the heart and help prevent fluid buildup in the lungs so breathing is easier.   Beta blockers. These prevent the heart from beating too fast and improve heart muscle strength. Beta blockers help maintain a normal heart rate, control blood pressure, and improve HF symptoms.   Digitalis. This increases the force of the heartbeat and may be helpful to people with HF or heart rhythm problems.   Healthy lifestyle changes include:   Stopping smoking.   Eating a healthy diet. Avoid  foods high in fat. Avoid foods fried in oil or made with fat. A dietician can help with healthy food choices.   Limiting how much salt you eat.   Limiting alcohol intake to no more than 1 drink per day for women and 2 drinks per day for men. Drinking more than that is harmful to your heart. If your heart has already been damaged by alcohol or you have severe HF, drinking alcohol should be stopped completely.   Exercising as directed by your caregiver.   Surgical treatment for HF may include:   Procedures to open blocked arteries, repair damaged heart valves, or remove damaged heart muscle tissue.   A pacemaker to help heart muscle function and to control certain abnormal heart rhythms.   A defibrillator to possibly prevent sudden cardiac death.  HOME CARE INSTRUCTIONS   Activity level. Your caregiver can help you determine what type of exercise program may be helpful. It is important to maintain your strength. Pace your physical activity to avoid shortness of breath or chest pain. Rest for 1 hour before and after meals. A cardiac rehabilitation program may be helpful to some people with HF.   Diet. Eat a heart healthy diet. Food choices should be low in saturated fat and cholesterol. Talk to a dietician to learn about heart healthy foods.   Salt intake. When you have HF, you need to limit the amount of salt you eat. Eat less than 1500 milligrams (mg) of salt per day or as recommended by your caregiver.   Weight monitoring. Weigh yourself every day. You should weigh yourself in the morning after you urinate and before you eat breakfast. Wear the same amount of clothing each time you weigh yourself. Record your weight daily. Bring your recorded weights to your clinic visits. Tell your caregiver right away if you have gained 3 lb/1.4 kg in 1 day, or 5 lb/2.3 kg in a week or whatever amount you were told to report.   Blood pressure monitoring. This should be done as directed by your caregiver. A  home blood pressure cuff can be purchased at a drugstore. Record your blood pressure numbers and bring them to your clinic visits. Tell your caregiver if you become dizzy or lightheaded upon standing up.   Smoking. If you are currently a smoker, it is time to quit. Nicotine makes your heart work harder by causing your blood vessels to constrict. Do not use nicotine  gum or patches before talking to your caregiver.   Follow up. Be sure to schedule a follow-up visit with your caregiver. Keep all your appointments.  SEEK MEDICAL CARE IF:   Your weight increases by 3 lb/1.4 kg in 1 day or 5 lb/2.3 kg in a week.   You notice increasing shortness of breath that is unusual for you. This may happen during rest, sleep, or with activity.   You cough more than normal, especially with physical activity.   You notice more swelling in your hands, feet, ankles, or belly (abdomen).   You are unable to sleep because it is hard to breathe.   You cough up bloody mucus (sputum).   You begin to feel "jumping" or "fluttering" sensations (palpitations) in your chest.  SEEK IMMEDIATE MEDICAL CARE IF:   You have severe chest pain or pressure which may include symptoms such as:   Pain or pressure in the arms, neck, jaw, or back.   Feeling sweaty.   Feeling sick to your stomach (nauseous).   Feeling short of breath while at rest.   Having a fast or irregular heartbeat.   You experience stroke symptoms. These symptoms include:   Facial weakness or numbness.   Weakness or numbness in an arm, leg, or on one side of your body.   Blurred vision.   Difficulty talking or thinking.   Dizziness or fainting.   Severe headache.  THESE ARE MEDICAL EMERGENCIES. Do not wait to see if the symptoms go away. Call your local emergency services (911 in [place] DO NOT drive yourself to the hospital. IMPORTANT  Make a list of every medicine, vitamin, or herbal supplement you are taking. Keep the list with you at all  times. Show it to your caregiver at every visit. Keep the list up-to-date.   Ask your caregiver or pharmacist to write an explanation of each medicine you are taking. This should include:   Why you are taking it.   The possible side effects.   The best time of day to take it.   Foods to take with it or what foods to avoid.   When to stop taking it.  MAKE SURE YOU:   Understand these instructions.   Will watch your condition.   Will get help right away if you are not doing well or get worse.  Document Released: 12/08/2005 Document Revised: 11/27/2011 Document Reviewed: 03/22/2010 [facility] Patient Information 2012 [place], [place].Health Maintenance, Males A healthy lifestyle and preventative care can promote health and wellness.  Maintain regular health, dental, and eye exams.   Eat a healthy diet. Foods like vegetables, fruits, whole grains, low-fat dairy products, and lean protein foods contain the nutrients you need without too many calories. Decrease your intake of foods high in solid fats, added sugars, and salt. Get information about a proper diet from your caregiver, if necessary.   Regular physical exercise is one of the most important things you can do for your health. Most adults should get at least 150 minutes of moderate-intensity exercise (any activity that increases your heart rate and causes you to sweat) each week. In addition, most adults need muscle-strengthening exercises on 2 or more days a week.    Maintain a healthy weight. The body mass index (BMI) is a screening tool to identify possible weight problems. It provides an estimate of body fat based on height and weight. Your caregiver can help determine your BMI, and can help you achieve or maintain a healthy weight. For  adults 20 years and older:   A BMI below 18.5 is considered underweight.   A BMI of 18.5 to 24.9 is normal.   A BMI of 25 to 29.9 is considered overweight.   A BMI of 30 and above is  considered obese.   Maintain normal blood lipids and cholesterol by exercising and minimizing your intake of saturated fat. Eat a balanced diet with plenty of fruits and vegetables. Blood tests for lipids and cholesterol should begin at age 28 and be repeated every 5 years. If your lipid or cholesterol levels are high, you are over 50, or you are a high risk for heart disease, you may need your cholesterol levels checked more frequently.Ongoing high lipid and cholesterol levels should be treated with medicines, if diet and exercise are not effective.   If you smoke, find out from your caregiver how to quit. If you do not use tobacco, do not start.   If you choose to drink alcohol, do not exceed 2 drinks per day. One drink is considered to be 12 ounces (355 mL) of beer, 5 ounces (148 mL) of wine, or 1.5 ounces (44 mL) of liquor.   Avoid use of street drugs. Do not share needles with anyone. Ask for help if you need support or instructions about stopping the use of drugs.   High blood pressure causes heart disease and increases the risk of stroke. Blood pressure should be checked at least every 1 to 2 years. Ongoing high blood pressure should be treated with medicines if weight loss and exercise are not effective.   If you are 67 to 63 years old, ask your caregiver if you should take aspirin to prevent heart disease.   Diabetes screening involves taking a blood sample to check your fasting blood sugar level. This should be done once every 3 years, after age 36, if you are within normal weight and without risk factors for diabetes. Testing should be considered at a younger age or be carried out more frequently if you are overweight and have at least 1 risk factor for diabetes.   Colorectal cancer can be detected and often prevented. Most routine colorectal cancer screening begins at the age of 21 and continues through age 43. However, your caregiver may recommend screening at an earlier age if you have  risk factors for colon cancer. On a yearly basis, your caregiver may provide home test kits to check for hidden blood in the stool. Use of a small camera at the end of a tube, to directly examine the colon (sigmoidoscopy or colonoscopy), can detect the earliest forms of colorectal cancer. Talk to your caregiver about this at age 33, when routine screening begins. Direct examination of the colon should be repeated every 5 to 10 years through age 53, unless early forms of pre-cancerous polyps or small growths are found.   Hepatitis C blood testing is recommended for all people born from [birth year] through 1967 and any individual with known risks for hepatitis C.   Healthy men should no longer receive prostate-specific antigen (PSA) blood tests as part of routine cancer screening. Consult with your caregiver about prostate cancer screening.   Testicular cancer screening is not recommended for adolescents or adult males who have no symptoms. Screening includes self-exam, caregiver exam, and other screening tests. Consult with your caregiver about any symptoms you have or any concerns you have about testicular cancer.   Practice safe sex. Use condoms and avoid high-risk sexual practices to reduce the  spread of sexually transmitted infections (STIs).   Use sunscreen with a sun protection factor (SPF) of 30 or greater. Apply sunscreen liberally and repeatedly throughout the day. You should seek shade when your shadow is shorter than you. Protect yourself by wearing long sleeves, pants, a wide-brimmed hat, and sunglasses year round, whenever you are outdoors.   Notify your caregiver of new moles or changes in moles, especially if there is a change in shape or color. Also notify your caregiver if a mole is larger than the size of a pencil eraser.   A one-time screening for abdominal aortic aneurysm (AAA) and surgical repair of large AAAs by sound wave imaging (ultrasonography) is recommended for ages 76 to 15 years  who are current or former smokers.   Stay current with your immunizations.  Document Released: 06/05/2008 Document Revised: 11/27/2011 Document Reviewed: 05/05/2011 [facility] Patient Information 2012 [place], [place].

## 2012-03-31 NOTE — Progress Notes (Signed)
Subjective:    Patient ID: [name], male    DOB: Mar 08, 1951, 61 y.o.   MRN: [id number]  Congestive Heart Failure Presents for follow-up visit. Associated symptoms include edema and shortness of breath. Pertinent negatives include no abdominal pain, chest pain, chest pressure, claudication, fatigue, muscle weakness, near-syncope, nocturia, orthopnea, palpitations, paroxysmal nocturnal dyspnea or unexpected weight change. The symptoms have been stable. Compliance with total regimen is 26-50%. Compliance problems include adherence to diet, adherence to exercise and medication cost.  Compliance with diet is 0-25%. Compliance with exercise is 0-25%. Compliance with medications is 26-50%.      Review of Systems  Constitutional: Negative for fever, chills, diaphoresis, activity change, appetite change, fatigue and unexpected weight change.  HENT: Negative.   Eyes: Negative.   Respiratory: Positive for shortness of breath. Negative for cough, choking, wheezing and stridor.   Cardiovascular: Positive for leg swelling. Negative for chest pain, palpitations, claudication and near-syncope.  Gastrointestinal: Negative for nausea, vomiting, abdominal pain, diarrhea, constipation and blood in stool.  Genitourinary: Negative for dysuria, urgency, frequency, hematuria, flank pain, decreased urine volume, enuresis, difficulty urinating and nocturia.  Musculoskeletal: Negative.  Negative for muscle weakness.  Skin: Negative for color change, pallor, rash and wound.  Neurological: Negative.   Hematological: Negative for adenopathy. Does not bruise/bleed easily.  Psychiatric/Behavioral: Negative.        Objective:   Physical Exam  Constitutional: He is oriented to person, place, and time. He appears well-developed and well-nourished. No distress.  HENT:  Head: Normocephalic and atraumatic.  Mouth/Throat: Oropharynx is clear and moist. No oropharyngeal exudate.  Eyes: Conjunctivae are normal. Right  eye exhibits no discharge. Left eye exhibits no discharge. No scleral icterus.  Neck: Normal range of motion. Neck supple. No JVD present. No tracheal deviation present. No thyromegaly present.  Cardiovascular: Normal rate, regular rhythm, S1 normal, S2 normal and intact distal pulses.  PMI is not displaced.  Exam reveals no gallop.   No murmur heard. Pulmonary/Chest: Effort normal and breath sounds normal. No stridor. No respiratory distress. He has no wheezes. He has no rales. He exhibits no tenderness.  Abdominal: Soft. Bowel sounds are normal. He exhibits no distension and no mass. There is no tenderness. There is no rebound and no guarding. Hernia confirmed negative in the right inguinal area and confirmed negative in the left inguinal area.  Genitourinary: Testes normal and penis normal. Rectal exam shows no external hemorrhoid, no internal hemorrhoid, no fissure, no mass, no tenderness and anal tone normal. Guaiac negative stool. Prostate is enlarged (2+ smooth bilateral BPH). Prostate is not tender. Right testis shows no mass, no swelling and no tenderness. Right testis is descended. Left testis shows no mass, no swelling and no tenderness. Left testis is descended. Uncircumcised. No phimosis, paraphimosis, hypospadias, penile erythema or penile tenderness. No discharge found.  Musculoskeletal: Normal range of motion. He exhibits edema (trace edema in both le's). He exhibits no tenderness.  Lymphadenopathy:    He has no cervical adenopathy.       Right: No inguinal adenopathy present.       Left: No inguinal adenopathy present.  Neurological: He is oriented to person, place, and time.  Skin: Skin is warm and dry. No rash noted. He is not diaphoretic. No erythema. No pallor.  Psychiatric: He has a normal mood and affect. His behavior is normal. Judgment and thought content normal.      Lab Results  Component Value Date   WBC 4.8 05/26/2011  HGB 11.8* 05/26/2011   HCT 36.1* 05/26/2011   PLT  216.0 05/26/2011   GLUCOSE 131* 05/26/2011   CHOL 189 05/26/2011   TRIG 165.0* 05/26/2011   HDL 29.90* 05/26/2011   LDLDIRECT 128.2 08/28/2010   LDLCALC 126* 05/26/2011   ALT 15 05/26/2011   AST 19 05/26/2011   NA 137 05/26/2011   K 4.1 05/26/2011   CL 106 05/26/2011   CREATININE 1.0 05/26/2011   BUN 19 05/26/2011   CO2 23 05/26/2011   TSH 1.27 05/26/2011   PSA 1.12 05/26/2011   INR 0.95 08/17/2010   HGBA1C 8.3* 05/26/2011      Assessment & Plan:

## 2012-04-02 ENCOUNTER — Encounter: Payer: Self-pay | Admitting: Internal Medicine

## 2012-04-02 DIAGNOSIS — E785 Hyperlipidemia, unspecified: Secondary | ICD-10-CM | POA: Insufficient documentation

## 2012-04-02 DIAGNOSIS — Z136 Encounter for screening for cardiovascular disorders: Secondary | ICD-10-CM | POA: Insufficient documentation

## 2012-04-02 DIAGNOSIS — Z0000 Encounter for general adult medical examination without abnormal findings: Secondary | ICD-10-CM | POA: Insufficient documentation

## 2012-04-02 MED ORDER — COLESEVELAM HCL 3.75 G PO PACK: 1.0000 | PACK | Freq: Every day | ORAL | Status: DC

## 2012-04-02 NOTE — Assessment & Plan Note (Signed)
He has some SOB and DOE but not much else to indicate that he has fluid retention, I will check his BNP and have asked him to f/up with cardiology

## 2012-04-02 NOTE — Assessment & Plan Note (Signed)
I will check his a1c today and will monitor his renal function 

## 2012-04-02 NOTE — Assessment & Plan Note (Signed)
I will check his CBC and look at his vitamin levels too

## 2012-04-02 NOTE — Assessment & Plan Note (Signed)
His EKG is unremarkable and he has not had any s/s of ischemia

## 2012-04-02 NOTE — Assessment & Plan Note (Signed)
His LDL remains too high for a person with DM and CAD so I have asked him to add welchol to his regimen

## 2012-04-02 NOTE — Assessment & Plan Note (Signed)
His BP is well controlled, I will check his lytes and his renal function 

## 2012-04-02 NOTE — Assessment & Plan Note (Signed)
I will check his PSA today 

## 2012-04-02 NOTE — Assessment & Plan Note (Addendum)
The patient is here for annual Medicare wellness examination and management of other chronic and acute problems.   The risk factors are reflected in the social history.  The roster of all physicians providing medical care to patient - is listed in the Snapshot section of the chart.  Activities of daily living:  The patient is 100% inedpendent in all ADLs: dressing, toileting, feeding as well as independent mobility  Home safety : The patient has smoke detectors in the home. They wear seatbelts.No firearms at home ( firearms are present in the home, kept in a safe fashion). There is no violence in the home.   There is no risks for hepatitis, STDs or HIV. There is no   history of blood transfusion. They have no travel history to infectious disease endemic areas of the world.  The patient has (has not) seen their dentist in the last six month. They have (not) seen their eye doctor in the last year. They deny (admit to) any hearing difficulty and have not had audiologic testing in the last year.  They do not  have excessive sun exposure. Discussed the need for sun protection: hats, long sleeves and use of sunscreen if there is significant sun exposure.   Diet: the importance of a healthy diet is discussed. They do have a healthy (unhealthy-high fat/fast food) diet.  The patient does not have a regular exercise program.  Depression screen: there are no signs or vegative symptoms of depression- irritability, change in appetite, anhedonia, sadness/tearfullness.  Cognitive assessment: the patient manages all their financial and personal affairs and is actively engaged. They could relate day,date,year and events; recalled 3/3 objects at 3 minutes; performed clock-face test normally.  The following portions of the patient's history were reviewed and updated as appropriate: allergies, current medications, past family history, past medical history,  past surgical history, past social history  and problem  list.  Vision, hearing, body mass index were assessed and reviewed.   During the course of the visit the patient was educated and counseled about appropriate screening and preventive services including : fall prevention , diabetes screening, nutrition counseling, colorectal cancer screening, and recommended immunizations.

## 2012-04-07 ENCOUNTER — Telehealth: Payer: Self-pay | Admitting: *Deleted

## 2012-04-07 NOTE — Telephone Encounter (Signed)
Patient had questions about Welchol and what it is prescribed for; spoke w/patient in which he began to ask questions about other medical issues, including B12 lab results & having cramping in his hands & legs he was advised that he needed a MD OV, instead of just a nurse visit [for b12 inj]; stated that he is legally blind and will have to wait until his wife gets home to have her call and schedule OV when she will be able to drive him.  Reiterated that MD advises OV soon; pt understood & agreed.

## 2012-04-21 ENCOUNTER — Ambulatory Visit (HOSPITAL_COMMUNITY): Payer: Medicare Other | Attending: Internal Medicine

## 2012-04-23 ENCOUNTER — Ambulatory Visit: Payer: Medicare Other

## 2012-04-25 ENCOUNTER — Other Ambulatory Visit: Payer: Self-pay | Admitting: Internal Medicine

## 2012-04-26 ENCOUNTER — Ambulatory Visit (INDEPENDENT_AMBULATORY_CARE_PROVIDER_SITE_OTHER): Payer: Medicare Other

## 2012-04-26 DIAGNOSIS — D510 Vitamin B12 deficiency anemia due to intrinsic factor deficiency: Secondary | ICD-10-CM

## 2012-04-27 MED ORDER — CYANOCOBALAMIN 1000 MCG/ML IJ SOLN
1000.0000 ug | Freq: Once | INTRAMUSCULAR | Status: AC
  Administered 2012-04-26: 1000 ug via INTRAMUSCULAR

## 2012-05-11 ENCOUNTER — Ambulatory Visit (INDEPENDENT_AMBULATORY_CARE_PROVIDER_SITE_OTHER): Payer: Medicare Other | Admitting: *Deleted

## 2012-05-11 DIAGNOSIS — D510 Vitamin B12 deficiency anemia due to intrinsic factor deficiency: Secondary | ICD-10-CM

## 2012-05-11 MED ORDER — CYANOCOBALAMIN 1000 MCG/ML IJ SOLN
1000.0000 ug | Freq: Once | INTRAMUSCULAR | Status: AC
  Administered 2012-05-11: 1000 ug via INTRAMUSCULAR

## 2012-05-25 ENCOUNTER — Ambulatory Visit (INDEPENDENT_AMBULATORY_CARE_PROVIDER_SITE_OTHER): Payer: Medicare Other

## 2012-05-25 DIAGNOSIS — D510 Vitamin B12 deficiency anemia due to intrinsic factor deficiency: Secondary | ICD-10-CM

## 2012-05-25 MED ORDER — CYANOCOBALAMIN 1000 MCG/ML IJ SOLN
1000.0000 ug | Freq: Once | INTRAMUSCULAR | Status: AC
  Administered 2012-05-25: 1000 ug via INTRAMUSCULAR

## 2012-06-08 ENCOUNTER — Ambulatory Visit (INDEPENDENT_AMBULATORY_CARE_PROVIDER_SITE_OTHER): Payer: Medicare Other | Admitting: *Deleted

## 2012-06-08 DIAGNOSIS — D510 Vitamin B12 deficiency anemia due to intrinsic factor deficiency: Secondary | ICD-10-CM

## 2012-06-08 MED ORDER — CYANOCOBALAMIN 1000 MCG/ML IJ SOLN
1000.0000 ug | Freq: Once | INTRAMUSCULAR | Status: AC
  Administered 2012-06-08: 1000 ug via INTRAMUSCULAR

## 2012-06-23 ENCOUNTER — Telehealth: Payer: Self-pay

## 2012-06-23 ENCOUNTER — Ambulatory Visit (INDEPENDENT_AMBULATORY_CARE_PROVIDER_SITE_OTHER): Payer: Medicare Other

## 2012-06-23 DIAGNOSIS — D510 Vitamin B12 deficiency anemia due to intrinsic factor deficiency: Secondary | ICD-10-CM

## 2012-06-23 DIAGNOSIS — I509 Heart failure, unspecified: Secondary | ICD-10-CM

## 2012-06-23 MED ORDER — CYANOCOBALAMIN 1000 MCG/ML IJ SOLN
1000.0000 ug | Freq: Once | INTRAMUSCULAR | Status: AC
  Administered 2012-06-23: 1000 ug via INTRAMUSCULAR

## 2012-06-23 NOTE — Telephone Encounter (Signed)
Pt and spouse requests Rx for shower chair. DME prepared and signed by TLJ

## 2012-07-03 ENCOUNTER — Other Ambulatory Visit: Payer: Self-pay | Admitting: Internal Medicine

## 2012-07-07 ENCOUNTER — Ambulatory Visit: Payer: Medicare Other

## 2012-07-15 DIAGNOSIS — H35359 Cystoid macular degeneration, unspecified eye: Secondary | ICD-10-CM | POA: Insufficient documentation

## 2012-07-15 DIAGNOSIS — Z947 Corneal transplant status: Secondary | ICD-10-CM | POA: Insufficient documentation

## 2012-09-11 ENCOUNTER — Emergency Department (HOSPITAL_COMMUNITY): Payer: Medicare Other

## 2012-09-11 ENCOUNTER — Encounter (HOSPITAL_COMMUNITY): Payer: Self-pay | Admitting: Emergency Medicine

## 2012-09-11 ENCOUNTER — Inpatient Hospital Stay (HOSPITAL_COMMUNITY)
Admission: EM | Admit: 2012-09-11 | Discharge: 2012-09-16 | DRG: 419 | Disposition: A | Discharge status: Home / Self Care | Payer: Medicare Other | Attending: Internal Medicine | Admitting: Internal Medicine

## 2012-09-11 DIAGNOSIS — Z136 Encounter for screening for cardiovascular disorders: Secondary | ICD-10-CM

## 2012-09-11 DIAGNOSIS — E1165 Type 2 diabetes mellitus with hyperglycemia: Secondary | ICD-10-CM | POA: Diagnosis present

## 2012-09-11 DIAGNOSIS — E875 Hyperkalemia: Secondary | ICD-10-CM

## 2012-09-11 DIAGNOSIS — I10 Essential (primary) hypertension: Secondary | ICD-10-CM | POA: Diagnosis present

## 2012-09-11 DIAGNOSIS — R112 Nausea with vomiting, unspecified: Secondary | ICD-10-CM

## 2012-09-11 DIAGNOSIS — N529 Male erectile dysfunction, unspecified: Secondary | ICD-10-CM

## 2012-09-11 DIAGNOSIS — M545 Low back pain, unspecified: Secondary | ICD-10-CM

## 2012-09-11 DIAGNOSIS — E7800 Pure hypercholesterolemia, unspecified: Secondary | ICD-10-CM

## 2012-09-11 DIAGNOSIS — R1013 Epigastric pain: Secondary | ICD-10-CM

## 2012-09-11 DIAGNOSIS — K859 Acute pancreatitis without necrosis or infection, unspecified: Principal | ICD-10-CM | POA: Diagnosis present

## 2012-09-11 DIAGNOSIS — R0600 Dyspnea, unspecified: Secondary | ICD-10-CM

## 2012-09-11 DIAGNOSIS — R109 Unspecified abdominal pain: Secondary | ICD-10-CM

## 2012-09-11 DIAGNOSIS — Z0000 Encounter for general adult medical examination without abnormal findings: Secondary | ICD-10-CM

## 2012-09-11 DIAGNOSIS — D510 Vitamin B12 deficiency anemia due to intrinsic factor deficiency: Secondary | ICD-10-CM

## 2012-09-11 DIAGNOSIS — H548 Legal blindness, as defined in USA: Secondary | ICD-10-CM

## 2012-09-11 DIAGNOSIS — N400 Enlarged prostate without lower urinary tract symptoms: Secondary | ICD-10-CM

## 2012-09-11 DIAGNOSIS — Z794 Long term (current) use of insulin: Secondary | ICD-10-CM

## 2012-09-11 DIAGNOSIS — E669 Obesity, unspecified: Secondary | ICD-10-CM | POA: Diagnosis present

## 2012-09-11 DIAGNOSIS — E119 Type 2 diabetes mellitus without complications: Secondary | ICD-10-CM | POA: Diagnosis present

## 2012-09-11 DIAGNOSIS — K141 Geographic tongue: Secondary | ICD-10-CM | POA: Diagnosis present

## 2012-09-11 DIAGNOSIS — K8050 Calculus of bile duct without cholangitis or cholecystitis without obstruction: Secondary | ICD-10-CM | POA: Diagnosis present

## 2012-09-11 DIAGNOSIS — Z23 Encounter for immunization: Secondary | ICD-10-CM

## 2012-09-11 DIAGNOSIS — R7401 Elevation of levels of liver transaminase levels: Secondary | ICD-10-CM

## 2012-09-11 DIAGNOSIS — I2510 Atherosclerotic heart disease of native coronary artery without angina pectoris: Secondary | ICD-10-CM | POA: Diagnosis present

## 2012-09-11 DIAGNOSIS — IMO0002 Reserved for concepts with insufficient information to code with codable children: Secondary | ICD-10-CM | POA: Diagnosis present

## 2012-09-11 DIAGNOSIS — E876 Hypokalemia: Secondary | ICD-10-CM | POA: Diagnosis not present

## 2012-09-11 DIAGNOSIS — H109 Unspecified conjunctivitis: Secondary | ICD-10-CM | POA: Diagnosis present

## 2012-09-11 DIAGNOSIS — I509 Heart failure, unspecified: Secondary | ICD-10-CM

## 2012-09-11 HISTORY — DX: Legal blindness, as defined in USA: H54.8

## 2012-09-11 LAB — CBC WITH DIFFERENTIAL/PLATELET
HCT: 39.6 % (ref 39.0–52.0)
Hemoglobin: 13.3 g/dL (ref 13.0–17.0)
Lymphs Abs: 1.8 10*3/uL (ref 0.7–4.0)
Monocytes Relative: 7 % (ref 3–12)
Neutro Abs: 11.3 10*3/uL — ABNORMAL HIGH (ref 1.7–7.7)
Neutrophils Relative %: 80 % — ABNORMAL HIGH (ref 43–77)
RBC: 5.6 MIL/uL (ref 4.22–5.81)

## 2012-09-11 LAB — COMPREHENSIVE METABOLIC PANEL
Alkaline Phosphatase: 92 U/L (ref 39–117)
BUN: 18 mg/dL (ref 6–23)
CO2: 20 mEq/L (ref 19–32)
Chloride: 101 mEq/L (ref 96–112)
GFR calc Af Amer: >90 mL/min (ref >90)
GFR calc non Af Amer: 88 mL/min — ABNORMAL LOW (ref >90)
Glucose, Bld: 199 mg/dL — ABNORMAL HIGH (ref 70–99)
Potassium: 4 mEq/L (ref 3.5–5.1)
Total Bilirubin: 1.8 mg/dL — ABNORMAL HIGH (ref 0.3–1.2)

## 2012-09-11 LAB — LIPASE, BLOOD: Lipase: >3000 U/L — ABNORMAL HIGH (ref 11–59)

## 2012-09-11 MED ORDER — IOHEXOL 300 MG/ML  SOLN
20.0000 mL | INTRAMUSCULAR | Status: AC
  Administered 2012-09-11: 20 mL via ORAL

## 2012-09-11 MED ORDER — HYDROMORPHONE HCL PF 2 MG/ML IJ SOLN
2.0000 mg | Freq: Once | INTRAMUSCULAR | Status: AC
  Administered 2012-09-11: 2 mg via INTRAVENOUS
  Filled 2012-09-11: qty 1

## 2012-09-11 MED ORDER — ONDANSETRON HCL 4 MG/2ML IJ SOLN
4.0000 mg | Freq: Once | INTRAMUSCULAR | Status: AC
  Administered 2012-09-11: 4 mg via INTRAVENOUS
  Filled 2012-09-11: qty 2

## 2012-09-11 MED ORDER — SODIUM CHLORIDE 0.9 % IV SOLN
Freq: Once | INTRAVENOUS | Status: AC
  Administered 2012-09-11: 75 mL/h via INTRAVENOUS

## 2012-09-11 MED ORDER — HYDROMORPHONE HCL PF 1 MG/ML IJ SOLN
1.0000 mg | Freq: Once | INTRAMUSCULAR | Status: AC
  Administered 2012-09-11: 1 mg via INTRAVENOUS
  Filled 2012-09-11: qty 1

## 2012-09-11 NOTE — ED Notes (Signed)
Patient reports abdominal pain, nausea, vomiting, diarrhea, and weakness for the last four hours.  4 episodes of emesis in the last 24 hours; patient vomiting in triage.  Denies chest pain, shortness of breath, and dizziness.

## 2012-09-11 NOTE — ED Provider Notes (Signed)
History     CSN: [id number]  Arrival date & time 09/11/12  4098   First MD Initiated Contact with Patient 09/11/12 1954      Chief Complaint  Patient presents with  . Abdominal Pain  . Emesis    (Consider location/radiation/quality/duration/timing/severity/associated sxs/prior treatment) HPI Comments: [name] 61 y.o. male   The chief complaint is: Patient presents with:   Abdominal Pain   Emesis   The patient has medical history significant for:   Past Medical History:   Glaucoma                                                     Ulcer                                                        HTN (hypertension)                                           Hypercholesterolemia                                         Diabetes mellitus                                              Comment:Type II   CHF (congestive heart failure)                               CAD (coronary artery disease)                                LBP (low back pain)                                          Legally blind                                               Patient presents with lower abdominal pain, nausea, and vomiting X5. Patient states his pain is 10/10 with some associated lower back pain. Denies fever or chills. Denies diarrhea or change in bowel pattern. Denies hematemesis. Denies CP or SOB     The history is provided by the patient.    Past Medical History  Diagnosis Date  . Glaucoma   . Ulcer   . HTN (hypertension)   . Hypercholesterolemia   . Diabetes mellitus     Type II  . CHF (congestive heart failure)   . CAD (coronary artery disease)   . LBP (  low back pain)   . Legally blind     Past Surgical History  Procedure Date  . Prosthetic cornea placement, right eye 2007    [facility]  . Lumbar fusion     Family History  Problem Relation Age of Onset  . Breast cancer Mother   . Colon cancer Mother   . Colon cancer Other     Elevated Risk for    History    Substance Use Topics  . Smoking status: Never Smoker   . Smokeless tobacco: Not on file  . Alcohol Use: No      Review of Systems  Constitutional: Negative for fever and chills.  Respiratory: Negative for shortness of breath.   Cardiovascular: Negative for chest pain.  Gastrointestinal: Positive for nausea, vomiting and abdominal pain. Negative for diarrhea and constipation.  All other systems reviewed and are negative.    Allergies  Lisinopril  Home Medications   Current Outpatient Rx  Name Route Sig Dispense Refill  . CARVEDILOL 12.5 MG PO TABS Oral Take 12.5 mg by mouth 2 (two) times daily with a meal.      . COLESEVELAM HCL 3.75 G PO PACK Oral Take 1 each by mouth daily. 90 each 3  . DORZOLAMIDE HCL-TIMOLOL MAL 22.3-6.8 MG/ML OP SOLN Right Eye Place 1 drop into the right eye 2 (two) times daily.      . FUROSEMIDE 40 MG PO TABS Oral Take 40 mg by mouth daily.      Marland Kitchen HUMALOG MIX 75/25 (75-25) 100 UNIT/ML  SUSP  85 UNITS EVERY MORNING AND 45 UNITS EVERY EVENING. 40 mL 10    Dispense as written.  . ISOSORBIDE MONONITRATE ER 60 MG PO TB24 Oral Take 90 mg by mouth daily.      Marland Kitchen JANUVIA 100 MG PO TABS  ONE BY MOUTH ONCE DAILY FOR DIABETES 30 tablet 6    CUSTOMER NEEDS REFILLS PLEASE. THANK YOU  . LOVAZA 1 G PO CAPS  TAKE 2 CAPSULES TWICE A DAY 120 capsule 3  . METFORMIN HCL 1000 MG PO TABS  ONE BY MOUTH TWO TIMES A DAY 60 tablet 6    CUSTOMER NEEDS REFILLS PLEASE. THANK YOU  . METHAZOLAMIDE 50 MG PO TABS Oral Take 50 mg by mouth 3 (three) times daily.      Marland Kitchen MICARDIS 40 MG PO TABS  ONE BY MOUTH ONCE DAILY FOR HIGH BLOOD PRESSURE 30 tablet 6  . NON FORMULARY  Nitrostat Mg Sublingual. Need dosage update.     Marland Kitchen PRAVASTATIN SODIUM 40 MG PO TABS  TAKE 1 TABLET BY MOUTH ONCE A DAY. 30 tablet 6  . RANITIDINE HCL 150 MG PO TABS  TAKE 1 TABLET BY MOUTH TWO TIMES A DAY. 60 tablet 8    BP 147/88  Pulse 93  Temp 98.5 F (36.9 C) (Oral)  Resp 18  SpO2 98%  Physical Exam   Nursing note and vitals reviewed. Constitutional: He appears well-developed and well-nourished. He appears distressed.  HENT:  Head: Normocephalic and atraumatic.  Mouth/Throat: Oropharynx is clear and moist.  Eyes:       Patient is legally blind and eyes remain shut.  Neck: Normal range of motion. Neck supple.  Pulmonary/Chest: Effort normal and breath sounds normal.  Abdominal: He exhibits no distension and no mass. There is tenderness. There is no rebound and no guarding.       Abdomen rigid with diffuse tenderness in all quadrants to palpation. Bowel sounds hypoactive.  Lymphadenopathy:  He has no cervical adenopathy.  Neurological: He is alert.  Skin: Skin is warm.    ED Course  Procedures (including critical care time)  Labs Reviewed  CBC WITH DIFFERENTIAL - Abnormal; Notable for the following:    WBC 14.2 (*)     MCV 70.7 (*)     MCH 23.8 (*)     Neutrophils Relative 80 (*)     Neutro Abs 11.3 (*)     All other components within normal limits  COMPREHENSIVE METABOLIC PANEL - Abnormal; Notable for the following:    Glucose, Bld 199 (*)     AST 140 (*)     ALT 70 (*)     Total Bilirubin 1.8 (*)     GFR calc non Af Amer 88 (*)     All other components within normal limits  URINALYSIS, ROUTINE W REFLEX MICROSCOPIC  LIPASE, BLOOD   Results for orders placed during the hospital encounter of 09/11/12  CBC WITH DIFFERENTIAL      Component Value Range   WBC 14.2 (*) 4.0 - 10.5 K/uL   RBC 5.60  4.22 - 5.81 MIL/uL   Hemoglobin 13.3  13.0 - 17.0 g/dL   HCT 16.1  09.6 - 04.5 %   MCV 70.7 (*) 78.0 - 100.0 fL   MCH 23.8 (*) 26.0 - 34.0 pg   MCHC 33.6  30.0 - 36.0 g/dL   RDW 40.9  81.1 - 91.4 %   Platelets 211  150 - 400 K/uL   Neutrophils Relative 80 (*) 43 - 77 %   Neutro Abs 11.3 (*) 1.7 - 7.7 K/uL   Lymphocytes Relative 12  12 - 46 %   Lymphs Abs 1.8  0.7 - 4.0 K/uL   Monocytes Relative 7  3 - 12 %   Monocytes Absolute 1.0  0.1 - 1.0 K/uL   Eosinophils Relative  1  0 - 5 %   Eosinophils Absolute 0.1  0.0 - 0.7 K/uL   Basophils Relative 0  0 - 1 %   Basophils Absolute 0.0  0.0 - 0.1 K/uL  COMPREHENSIVE METABOLIC PANEL      Component Value Range   Sodium 136  135 - 145 mEq/L   Potassium 4.0  3.5 - 5.1 mEq/L   Chloride 101  96 - 112 mEq/L   CO2 20  19 - 32 mEq/L   Glucose, Bld 199 (*) 70 - 99 mg/dL   BUN 18  6 - 23 mg/dL   Creatinine, Ser 7.82  0.50 - 1.35 mg/dL   Calcium 9.7  8.4 - 95.6 mg/dL   Total Protein 7.4  6.0 - 8.3 g/dL   Albumin 3.8  3.5 - 5.2 g/dL   AST 213 (*) 0 - 37 U/L   ALT 70 (*) 0 - 53 U/L   Alkaline Phosphatase 92  39 - 117 U/L   Total Bilirubin 1.8 (*) 0.3 - 1.2 mg/dL   GFR calc non Af Amer 88 (*) >90 mL/min   GFR calc Af Amer >90  >90 mL/min  LIPASE, BLOOD      Component Value Range   Lipase >3000 (*) 11 - 59 U/L    Date: 09/11/2012  Rate: 86  Rhythm: normal sinus rhythm  QRS Axis: left  Intervals: normal  ST/T Wave abnormalities: normal  Conduction Disutrbances:none  Narrative Interpretation: NO STEMI  Old EKG Reviewed: none available   Ct Abdomen Pelvis W Contrast  09/12/2012  *RADIOLOGY REPORT*  Clinical Data: elevated lipase  rule out pancreatitis ABDOMINAL PAIN EMESIS  CT ABDOMEN AND PELVIS WITH CONTRAST  Technique:  Multidetector CT imaging of the abdomen and pelvis was performed following the standard protocol during bolus administration of intravenous contrast.  Contrast: OMNIPAQUE IOHEXOL 300 MG/ML  SOLN  Comparison: None.  Findings: Calcifications involving the LAD, RCA and left circumflex coronary arteries noted.  No pericardial or pleural effusions.  There is mild fatty infiltration of the liver.  No suspicious liver abnormalities. Multiple tiny stones versus gallbladder sludge noted within the lumen of the gallbladder.  No gallbladder wall thickening.  No biliary dilatation.  Edema of the pancreatic parenchyma with peripancreatic fat stranding is noted consistent with pancreatitis.  No evidence for  pancreatic necrosis or pseudocyst.  No fluid collections noted within the upper abdomen. The spleen is normal.  Both adrenal glands are normal.  Normal appearance of the right kidney.  The left kidney is normal.  The urinary bladder is within normal limits.  Prostate gland is enlarged measuring 4.4 cm in transverse dimension, image 87.  The seminal vesicles have a symmetric appearance.  No enlarged upper abdominal lymph nodes.  There is no pelvic or inguinal adenopathy.  The stomach appears normal.  The small bowel loops are unremarkable.  The appendix is noted and appears normal.  The normal appearance of the colon.  Multiple sigmoid diverticula identified without acute inflammation.  Review of the visualized osseous structures is significant for multilevel lumbar degenerative disc disease.  IMPRESSION:  1.  Acute pancreatitis.  No evidence for pancreatic necrosis or pseudocyst.  2.  Tiny gallstones versus gallbladder sludge.  3.  Fatty infiltration of the liver.   Original Report Authenticated By: Rosealee Albee, M.D.    Dg Abd Acute W/chest  09/11/2012  *RADIOLOGY REPORT*  Clinical Data: Abdominal pain  ACUTE ABDOMEN SERIES (ABDOMEN 2 VIEW & CHEST 1 VIEW)  Comparison: 03/18/2009  Findings: Mild cardiac enlargement.  No pleural effusion or edema. No airspace consolidation.  No dilated loops of small bowel or air- fluid levels noted.  There is gas and stool noted within the colon. No free intraperitoneal air identified.  IMPRESSION:  1.  No acute cardiopulmonary abnormalities.   Original Report Authenticated By: Rosealee Albee, M.D.      1. Abdominal pain   2. Nausea and vomiting   3. Elevated transaminase level   4. Pancreatitis       MDM  Patient presented with diffuse abdominal pain, nausea, and vomiting. Patient given pain medication and zofran with improvement.CBC: leukocytosis CMP: elevated transaminases and hyperglycemia. Acute abdomen series: unremarkable. Lipase: greater than 3000 that  in addition to hypoactive bowel sounds on exam concerning for pancreatitis. CT abdomen pelvis with contrast: remarkable for acute pancreatitis. Hospitalist called for admission, and patient admitted to med-surg under Dr.Jenkins.        Pixie Casino, PA-C 09/12/12 0136

## 2012-09-12 ENCOUNTER — Emergency Department (HOSPITAL_COMMUNITY): Payer: Medicare Other

## 2012-09-12 ENCOUNTER — Encounter (HOSPITAL_COMMUNITY): Payer: Self-pay | Admitting: Internal Medicine

## 2012-09-12 DIAGNOSIS — R109 Unspecified abdominal pain: Secondary | ICD-10-CM

## 2012-09-12 DIAGNOSIS — K859 Acute pancreatitis without necrosis or infection, unspecified: Principal | ICD-10-CM | POA: Diagnosis present

## 2012-09-12 DIAGNOSIS — R1013 Epigastric pain: Secondary | ICD-10-CM | POA: Diagnosis present

## 2012-09-12 LAB — CBC
HCT: 38.3 % — ABNORMAL LOW (ref 39.0–52.0)
MCV: 71.5 fL — ABNORMAL LOW (ref 78.0–100.0)
Platelets: 209 10*3/uL (ref 150–400)
RBC: 5.36 MIL/uL (ref 4.22–5.81)
WBC: 7.9 10*3/uL (ref 4.0–10.5)

## 2012-09-12 LAB — BASIC METABOLIC PANEL
CO2: 21 mEq/L (ref 19–32)
Calcium: 9 mg/dL (ref 8.4–10.5)
Chloride: 101 mEq/L (ref 96–112)
GFR calc Af Amer: >90 mL/min (ref >90)
Sodium: 135 mEq/L (ref 135–145)

## 2012-09-12 LAB — URINALYSIS, ROUTINE W REFLEX MICROSCOPIC
Glucose, UA: >1000 mg/dL — AB
Ketones, ur: 15 mg/dL — AB
Leukocytes, UA: NEGATIVE
Protein, ur: NEGATIVE mg/dL

## 2012-09-12 LAB — GLUCOSE, CAPILLARY
Glucose-Capillary: 179 mg/dL — ABNORMAL HIGH (ref 70–99)
Glucose-Capillary: 191 mg/dL — ABNORMAL HIGH (ref 70–99)
Glucose-Capillary: 179 mg/dL — ABNORMAL HIGH (ref 70–99)

## 2012-09-12 LAB — URINE MICROSCOPIC-ADD ON

## 2012-09-12 MED ORDER — ONDANSETRON HCL 4 MG PO TABS: 4.0000 mg | ORAL_TABLET | Freq: Four times a day (QID) | ORAL | Status: DC | PRN

## 2012-09-12 MED ORDER — PNEUMOCOCCAL VAC POLYVALENT 25 MCG/0.5ML IJ INJ
0.5000 mL | INJECTION | INTRAMUSCULAR | Status: AC
  Administered 2012-09-13: 0.5 mL via INTRAMUSCULAR
  Filled 2012-09-12: qty 0.5

## 2012-09-12 MED ORDER — ACETAMINOPHEN 325 MG PO TABS
650.0000 mg | ORAL_TABLET | Freq: Four times a day (QID) | ORAL | Status: DC | PRN
  Administered 2012-09-15 – 2012-09-16 (×2): 650 mg via ORAL
  Filled 2012-09-12 (×2): qty 2

## 2012-09-12 MED ORDER — ZOLPIDEM TARTRATE 5 MG PO TABS
5.0000 mg | ORAL_TABLET | Freq: Every evening | ORAL | Status: DC | PRN
  Administered 2012-09-16: 5 mg via ORAL
  Filled 2012-09-12: qty 1

## 2012-09-12 MED ORDER — DORZOLAMIDE HCL-TIMOLOL MAL 2-0.5 % OP SOLN
1.0000 [drp] | Freq: Two times a day (BID) | OPHTHALMIC | Status: DC
  Administered 2012-09-12 – 2012-09-16 (×8): 1 [drp] via OPHTHALMIC
  Filled 2012-09-12 (×2): qty 10

## 2012-09-12 MED ORDER — PANTOPRAZOLE SODIUM 40 MG IV SOLR
40.0000 mg | Freq: Two times a day (BID) | INTRAVENOUS | Status: DC
  Administered 2012-09-12 – 2012-09-13 (×4): 40 mg via INTRAVENOUS
  Filled 2012-09-12 (×5): qty 40

## 2012-09-12 MED ORDER — INSULIN ASPART 100 UNIT/ML ~~LOC~~ SOLN
0.0000 [IU] | Freq: Every day | SUBCUTANEOUS | Status: DC
  Administered 2012-09-12: 2 [IU] via SUBCUTANEOUS

## 2012-09-12 MED ORDER — INSULIN ASPART 100 UNIT/ML ~~LOC~~ SOLN
0.0000 [IU] | Freq: Three times a day (TID) | SUBCUTANEOUS | Status: DC
  Administered 2012-09-12 (×2): 3 [IU] via SUBCUTANEOUS
  Administered 2012-09-13: 2 [IU] via SUBCUTANEOUS
  Administered 2012-09-13: 3 [IU] via SUBCUTANEOUS

## 2012-09-12 MED ORDER — ONDANSETRON HCL 4 MG/2ML IJ SOLN: 4.0000 mg | Freq: Four times a day (QID) | INTRAMUSCULAR | Status: DC | PRN

## 2012-09-12 MED ORDER — INFLUENZA VIRUS VACC SPLIT PF IM SUSP
0.5000 mL | Freq: Once | INTRAMUSCULAR | Status: AC
  Administered 2012-09-12: 0.5 mL via INTRAMUSCULAR
  Filled 2012-09-12: qty 0.5

## 2012-09-12 MED ORDER — ENOXAPARIN SODIUM 40 MG/0.4ML ~~LOC~~ SOLN
40.0000 mg | Freq: Every day | SUBCUTANEOUS | Status: DC
  Administered 2012-09-12 – 2012-09-13 (×2): 40 mg via SUBCUTANEOUS
  Filled 2012-09-12 (×3): qty 0.4

## 2012-09-12 MED ORDER — ALUM & MAG HYDROXIDE-SIMETH 200-200-20 MG/5ML PO SUSP: 30.0000 mL | Freq: Four times a day (QID) | ORAL | Status: DC | PRN

## 2012-09-12 MED ORDER — CARVEDILOL 12.5 MG PO TABS
12.5000 mg | ORAL_TABLET | Freq: Two times a day (BID) | ORAL | Status: DC
  Administered 2012-09-12 – 2012-09-16 (×8): 12.5 mg via ORAL
  Filled 2012-09-12 (×11): qty 1

## 2012-09-12 MED ORDER — ACETAMINOPHEN 650 MG RE SUPP: 650.0000 mg | Freq: Four times a day (QID) | RECTAL | Status: DC | PRN

## 2012-09-12 MED ORDER — IOHEXOL 300 MG/ML  SOLN
100.0000 mL | Freq: Once | INTRAMUSCULAR | Status: AC | PRN
  Administered 2012-09-12: 100 mL via INTRAVENOUS

## 2012-09-12 MED ORDER — INSULIN ASPART 100 UNIT/ML ~~LOC~~ SOLN
0.0000 [IU] | SUBCUTANEOUS | Status: DC
  Administered 2012-09-12: 5 [IU] via SUBCUTANEOUS
  Administered 2012-09-12: 3 [IU] via SUBCUTANEOUS

## 2012-09-12 MED ORDER — OXYCODONE HCL 5 MG PO TABS
5.0000 mg | ORAL_TABLET | ORAL | Status: DC | PRN
  Administered 2012-09-13 – 2012-09-16 (×6): 5 mg via ORAL
  Filled 2012-09-12 (×6): qty 1

## 2012-09-12 MED ORDER — PREDNISOLONE ACETATE 1 % OP SUSP
1.0000 [drp] | Freq: Three times a day (TID) | OPHTHALMIC | Status: DC
  Administered 2012-09-12 – 2012-09-16 (×12): 1 [drp] via OPHTHALMIC
  Filled 2012-09-12: qty 5
  Filled 2012-09-12: qty 1
  Filled 2012-09-12: qty 5

## 2012-09-12 MED ORDER — SODIUM CHLORIDE 0.9 % IV SOLN
INTRAVENOUS | Status: DC
  Administered 2012-09-12 (×2): via INTRAVENOUS
  Administered 2012-09-13: 1000 mL via INTRAVENOUS
  Administered 2012-09-13: 01:00:00 via INTRAVENOUS
  Administered 2012-09-14: 1000 mL via INTRAVENOUS
  Administered 2012-09-14 – 2012-09-15 (×2): 100 mL/h via INTRAVENOUS
  Administered 2012-09-15 – 2012-09-16 (×2): via INTRAVENOUS

## 2012-09-12 MED ORDER — HYDROMORPHONE HCL PF 1 MG/ML IJ SOLN
0.5000 mg | INTRAMUSCULAR | Status: DC | PRN
  Administered 2012-09-12 – 2012-09-15 (×6): 1 mg via INTRAVENOUS
  Filled 2012-09-12 (×6): qty 1

## 2012-09-12 MED ORDER — ISOSORBIDE MONONITRATE ER 60 MG PO TB24
90.0000 mg | ORAL_TABLET | Freq: Every day | ORAL | Status: DC
  Administered 2012-09-12 – 2012-09-16 (×5): 90 mg via ORAL
  Filled 2012-09-12 (×5): qty 1

## 2012-09-12 NOTE — ED Provider Notes (Signed)
Medical screening examination/treatment/procedure(s) were conducted as a shared visit with non-physician practitioner(s) and myself.  I personally evaluated the patient during the encounter.  Epigastric pain, grossly elevated lipase, CT scan consistent with pancreatitis. Admit.  Donnetta Hutching, MD 09/12/12 1758

## 2012-09-12 NOTE — Progress Notes (Addendum)
[facility] PROGRESS NOTE  OLUWASEYI RAFFEL ZOX:[id number] DOB: [birth year] DOA: 09/11/2012 PCP: Sanda Linger, MD  Assessment/Plan: Acute pancreatitis -Suspect gallstone pancreatitis -Cannot rule out medication-induced, particularly furosemide -Abdominal ultrasound -Urine drug screen -Check lipids -Continue IV fluids, clear liquid diet as tolerated Diabetes mellitus type 2 -Hold oral hypoglycemics at this time -Continue NovoLog sliding scale -Check hemoglobin A1c -Check TSH, lipids Dysrhythmia - Check EKG -Optimize electrolytes, check magnesium Coronary artery disease -Continue Coreg and Imdur Hyperlipidemia -Restart Pravachol, WelChol, and Lovaza when tolerating diet Hypertension -Controlled at this time   Disposition Plan:   Home when medically stable     Procedures/Studies: Ct Abdomen Pelvis W Contrast  09/12/2012  *RADIOLOGY REPORT*  Clinical Data: elevated lipase rule out pancreatitis ABDOMINAL PAIN EMESIS  CT ABDOMEN AND PELVIS WITH CONTRAST  Technique:  Multidetector CT imaging of the abdomen and pelvis was performed following the standard protocol during bolus administration of intravenous contrast.  Contrast: OMNIPAQUE IOHEXOL 300 MG/ML  SOLN  Comparison: None.  Findings: Calcifications involving the LAD, RCA and left circumflex coronary arteries noted.  No pericardial or pleural effusions.  There is mild fatty infiltration of the liver.  No suspicious liver abnormalities. Multiple tiny stones versus gallbladder sludge noted within the lumen of the gallbladder.  No gallbladder wall thickening.  No biliary dilatation.  Edema of the pancreatic parenchyma with peripancreatic fat stranding is noted consistent with pancreatitis.  No evidence for pancreatic necrosis or pseudocyst.  No fluid collections noted within the upper abdomen. The spleen is normal.  Both adrenal glands are normal.  Normal appearance of the right kidney.  The left kidney is normal.  The urinary  bladder is within normal limits.  Prostate gland is enlarged measuring 4.4 cm in transverse dimension, image 87.  The seminal vesicles have a symmetric appearance.  No enlarged upper abdominal lymph nodes.  There is no pelvic or inguinal adenopathy.  The stomach appears normal.  The small bowel loops are unremarkable.  The appendix is noted and appears normal.  The normal appearance of the colon.  Multiple sigmoid diverticula identified without acute inflammation.  Review of the visualized osseous structures is significant for multilevel lumbar degenerative disc disease.  IMPRESSION:  1.  Acute pancreatitis.  No evidence for pancreatic necrosis or pseudocyst.  2.  Tiny gallstones versus gallbladder sludge.  3.  Fatty infiltration of the liver.   Original Report Authenticated By: Rosealee Albee, M.D.    Dg Abd Acute W/chest  09/11/2012  *RADIOLOGY REPORT*  Clinical Data: Abdominal pain  ACUTE ABDOMEN SERIES (ABDOMEN 2 VIEW & CHEST 1 VIEW)  Comparison: 03/18/2009  Findings: Mild cardiac enlargement.  No pleural effusion or edema. No airspace consolidation.  No dilated loops of small bowel or air- fluid levels noted.  There is gas and stool noted within the colon. No free intraperitoneal air identified.  IMPRESSION:  1.  No acute cardiopulmonary abnormalities.   Original Report Authenticated By: Rosealee Albee, M.D.          Subjective: Patient states that abdominal pain is 50% better. He states that his nausea and vomiting have improved. He feels hungry this morning. He wants to eat some grits. He denies any chest pain, shortness of breath,, palpitations, dizziness,, diarrhea, dysuria, fevers, chills.  Objective: Filed Vitals:   09/12/12 0224 09/12/12 0300 09/12/12 0537 09/12/12 0931  BP: 162/90 162/90 135/54 137/99  Pulse:  98 108 102  Temp:  98.6 F (37 C) 99 F (37.2 C) 99 F (  37.2 C)  TempSrc:  Oral Oral Oral  Resp:  18 18 16   Height:  5\' 10"  (1.778 m)    Weight:  113.1 kg (249 lb 5.4  oz)    SpO2:  95% 98% 97%    Intake/Output Summary (Last 24 hours) at 09/12/12 1144 Last data filed at 09/12/12 0829  Gross per 24 hour  Intake    840 ml  Output   1400 ml  Net   -560 ml   Weight change:  Exam:   General:  Pt is alert, follows commands appropriately, not in acute distress  HEENT: No icterus, No thrush, No neck mass, [place]/AT, edentulous  Cardiovascular: irregular, S1/S2, no rubs  Respiratory: Clear to auscultation bilaterally, no wheezing, no crackles, no rhonchi  Abdomen: Soft/+BS,non distended, no guarding; epigastric tenderness without guarding or peritoneal signs  Extremities: No edema, No lymphangitis, No petechiae, No rashes, no synovitis  Data Reviewed: Basic Metabolic Panel:  Lab 09/12/12 1610 09/11/12 1933  NA 135 136  K 4.1 4.0  CL 101 101  CO2 21 20  GLUCOSE 245* 199*  BUN 16 18  CREATININE 0.83 0.91  CALCIUM 9.0 9.7  MG -- --  PHOS -- --   Liver Function Tests:  Lab 09/11/12 1933  AST 140*  ALT 70*  ALKPHOS 92  BILITOT 1.8*  PROT 7.4  ALBUMIN 3.8    Lab 09/11/12 1933  LIPASE >3000*  AMYLASE --   No results found for this basename: AMMONIA:5 in the last 168 hours CBC:  Lab 09/12/12 0445 09/11/12 1933  WBC 7.9 14.2*  NEUTROABS -- 11.3*  HGB 12.4* 13.3  HCT 38.3* 39.6  MCV 71.5* 70.7*  PLT 209 211   Cardiac Enzymes: No results found for this basename: CKTOTAL:5,CKMB:5,CKMBINDEX:5,TROPONINI:5 in the last 168 hours BNP: No components found with this basename: POCBNP:5 CBG:  Lab 09/12/12 0826 09/12/12 0308 09/11/12 2326  GLUCAP 179* 225* 237*    No results found for this or any previous visit (from the past 240 hour(s)).   Scheduled Meds:   . sodium chloride   Intravenous Once  . carvedilol  12.5 mg Oral BID WC  . dorzolamide-timolol  1 drop Right Eye BID  . enoxaparin (LOVENOX) injection  40 mg Subcutaneous Daily  .  HYDROmorphone (DILAUDID) injection  1 mg Intravenous Once  .  HYDROmorphone (DILAUDID)  injection  2 mg Intravenous Once  . influenza  inactive virus vaccine  0.5 mL Intramuscular Once  . insulin aspart  0-15 Units Subcutaneous TID WC  . insulin aspart  0-5 Units Subcutaneous QHS  . iohexol  20 mL Oral Q1 Hr x 2  . isosorbide mononitrate  90 mg Oral Daily  . ondansetron (ZOFRAN) IV  4 mg Intravenous Once  . ondansetron (ZOFRAN) IV  4 mg Intravenous Once  . pantoprazole (PROTONIX) IV  40 mg Intravenous Q12H  . pneumococcal 23 valent vaccine  0.5 mL Intramuscular Tomorrow-1000  . prednisoLONE acetate  1 drop Right Eye TID  . DISCONTD: insulin aspart  0-15 Units Subcutaneous Q4H   Continuous Infusions:   . sodium chloride 100 mL/hr at 09/12/12 0355   Total time 60 min  Alhaji Mcneal, DO  [facility] Pager [phone]  If 7PM-7AM, please contact night-coverage [email or web address] Password [facility] 09/12/2012, 11:44 AM   LOS: 1 day

## 2012-09-12 NOTE — H&P (Signed)
[facility] History and Physical  [name] NGE:[id number] DOB: 15-May-1951 DOA: 09/11/2012  Referring physician: EDP PCP: Sanda Linger, MD   Chief Complaint: ABD Pain HPI: [name] is a 61 y.o. male with Multiple Medical problems who presents with complaints of sudden epigastric pain in the afternoon, that was described as constant sharp and stabbing, 10/10 pain, associated with nausea and vomiting.  He reports not being able to hold down any foods or liquids.  He was evaluated in the ED and found to have to a lipase level of 3000, and a ct scan of the ABD was performed which revealed acute pancreatitis.  He denied having any previous bouts of pancreatitis in the past.     Review of Systems: The patient denies anorexia, fever, weight loss, decreased hearing, hoarseness, chest pain, syncope, dyspnea on exertion, peripheral edema, balance deficits, hemoptysis, diarrhea, constipation, melena, hematochezia, severe indigestion/heartburn, hematuria, incontinence, genital sores, muscle weakness, suspicious skin lesions, transient blindness, difficulty walking, depression, unusual weight change, abnormal bleeding, enlarged lymph nodes, angioedema, and breast masses.    Past Medical History  Diagnosis Date  . Glaucoma   . Ulcer   . HTN (hypertension)   . Hypercholesterolemia   . Diabetes mellitus     Type II  . CHF (congestive heart failure)   . CAD (coronary artery disease)   . LBP (low back pain)   . Legally blind    Past Surgical History  Procedure Date  . Prosthetic cornea placement, right eye [facility]  . Lumbar fusion    Social History:  reports that he has never smoked. He does not have any smokeless tobacco history on file. He reports that he does not drink alcohol. His drug history not on file.  Allergies  Allergen Reactions  . Diamox (Acetazolamide) Itching  . Lisinopril Rash    Family History  Problem Relation Age of Onset  . Breast cancer Mother    . Colon cancer Mother   . Colon cancer Other     Elevated Risk for    (be sure to complete)  Prior to Admission medications   Medication Sig Start Date End Date Taking? Authorizing Provider  carvedilol (COREG) 12.5 MG tablet Take 12.5 mg by mouth 2 (two) times daily with a meal.     Yes Historical Provider, MD  Colesevelam HCl 3.75 G PACK Take 1 each by mouth daily. 04/02/12  Yes Etta Grandchild, MD  dorzolamide-timolol (COSOPT) 22.3-6.8 MG/ML ophthalmic solution Place 1 drop into the right eye 2 (two) times daily.     Yes Historical Provider, MD  furosemide (LASIX) 40 MG tablet Take 40 mg by mouth daily.    Yes Historical Provider, MD  insulin lispro protamine-insulin lispro (HUMALOG 75/25) (75-25) 100 UNIT/ML SUSP Inject 45-85 Units into the skin 2 (two) times daily with a meal. Takes 85 units in the am & 45 units in the pm   Yes Historical Provider, MD  isosorbide mononitrate (IMDUR) 60 MG 24 hr tablet Take 90 mg by mouth daily.    Yes Historical Provider, MD  metFORMIN (GLUCOPHAGE) 1000 MG tablet Take 1,000 mg by mouth 2 (two) times daily with a meal.   Yes Historical Provider, MD  methazolamide (NEPTAZANE) 50 MG tablet Take 50 mg by mouth 3 (three) times daily.     Yes Historical Provider, MD  omega-3 acid ethyl esters (LOVAZA) 1 G capsule Take 1 g by mouth 2 (two) times daily.   Yes  Historical Provider, MD  pravastatin (PRAVACHOL) 40 MG tablet Take 40 mg by mouth daily.   Yes Historical Provider, MD  prednisoLONE acetate (PRED FORTE) 1 % ophthalmic suspension Place 1 drop into the right eye 3 (three) times daily.   Yes Historical Provider, MD  ranitidine (ZANTAC) 150 MG tablet Take 150 mg by mouth 2 (two) times daily.   Yes Historical Provider, MD  sitaGLIPtin (JANUVIA) 100 MG tablet Take 100 mg by mouth daily.   Yes Historical Provider, MD  telmisartan (MICARDIS) 40 MG tablet Take 40 mg by mouth daily.   Yes Historical Provider, MD    Physical Exam:  GEN:  Pleasant 61 year old Obese  African American Male examined  and in no acute distress; cooperative with exam Filed Vitals:   09/11/12 2315 09/11/12 2330 09/11/12 2345 09/12/12 0000  BP:    158/72  Pulse: 93 95 96 96  Temp:      TempSrc:      Resp: 14 14 14 19   SpO2: 95% 98% 95% 96%   Blood pressure 158/72, pulse 96, temperature 98.5 F (36.9 C), temperature source Oral, resp. rate 19, SpO2 96.00%. PSYCH: He is alert and oriented x4; does not appear anxious does not appear depressed; affect is normal HEENT: Normocephalic and Atraumatic, Mucous membranes pink; Right corneal changes from surgical prosthesis, PERRLA; EOM intact; Fundi:  Unable to visualize.  Benign;  No scleral icterus, Nares: Patent, Oropharynx: Clear, Edentulous,  Neck:  FROM, no cervical lymphadenopathy nor thyromegaly or carotid bruit; no JVD; Breasts:: Not examined CHEST WALL: No tenderness CHEST: Normal respiration, clear to auscultation bilaterally HEART: Regular rate and rhythm; no murmurs rubs or gallops BACK: No kyphosis or scoliosis; no CVA tenderness ABDOMEN: Positive Bowel Sounds,  Obese, soft,  Tender in the epigastrium, No Hepatosplenomegaly, no masses. Rectal Exam: Not done EXTREMITIES: No bone or joint deformity; No cyanosis, clubbing or edema; no ulcerations. Genitalia: not examined PULSES: 2+ and symmetric SKIN: Normal hydration no rash or ulceration CNS: Cranial nerves 2-12 grossly intact   Except for Vision(legally Blind),  otherwise no focal neurologic deficits   Labs on Admission:  Basic Metabolic Panel:  Lab 09/11/12 1610  NA 136  K 4.0  CL 101  CO2 20  GLUCOSE 199*  BUN 18  CREATININE 0.91  CALCIUM 9.7  MG --  PHOS --   Liver Function Tests:  Lab 09/11/12 1933  AST 140*  ALT 70*  ALKPHOS 92  BILITOT 1.8*  PROT 7.4  ALBUMIN 3.8    Lab 09/11/12 1933  LIPASE >3000*  AMYLASE --   No results found for this basename: AMMONIA:5 in the last 168 hours CBC:  Lab 09/11/12 1933  WBC 14.2*  NEUTROABS 11.3*    HGB 13.3  HCT 39.6  MCV 70.7*  PLT 211   Cardiac Enzymes: No results found for this basename: CKTOTAL:5,CKMB:5,CKMBINDEX:5,TROPONINI:5 in the last 168 hours  BNP (last 3 results)  Basename 03/31/12 1003  PROBNP 5.0   CBG:  Lab 09/11/12 2326  GLUCAP 237*    Radiological Exams on Admission: Ct Abdomen Pelvis W Contrast  09/12/2012  *RADIOLOGY REPORT*  Clinical Data: elevated lipase rule out pancreatitis ABDOMINAL PAIN EMESIS  CT ABDOMEN AND PELVIS WITH CONTRAST  Technique:  Multidetector CT imaging of the abdomen and pelvis was performed following the standard protocol during bolus administration of intravenous contrast.  Contrast: OMNIPAQUE IOHEXOL 300 MG/ML  SOLN  Comparison: None.  Findings: Calcifications involving the LAD, RCA and left circumflex coronary  arteries noted.  No pericardial or pleural effusions.  There is mild fatty infiltration of the liver.  No suspicious liver abnormalities. Multiple tiny stones versus gallbladder sludge noted within the lumen of the gallbladder.  No gallbladder wall thickening.  No biliary dilatation.  Edema of the pancreatic parenchyma with peripancreatic fat stranding is noted consistent with pancreatitis.  No evidence for pancreatic necrosis or pseudocyst.  No fluid collections noted within the upper abdomen. The spleen is normal.  Both adrenal glands are normal.  Normal appearance of the right kidney.  The left kidney is normal.  The urinary bladder is within normal limits.  Prostate gland is enlarged measuring 4.4 cm in transverse dimension, image 87.  The seminal vesicles have a symmetric appearance.  No enlarged upper abdominal lymph nodes.  There is no pelvic or inguinal adenopathy.  The stomach appears normal.  The small bowel loops are unremarkable.  The appendix is noted and appears normal.  The normal appearance of the colon.  Multiple sigmoid diverticula identified without acute inflammation.  Review of the visualized osseous structures  is significant for multilevel lumbar degenerative disc disease.  IMPRESSION:  1.  Acute pancreatitis.  No evidence for pancreatic necrosis or pseudocyst.  2.  Tiny gallstones versus gallbladder sludge.  3.  Fatty infiltration of the liver.   Original Report Authenticated By: Rosealee Albee, M.D.    Dg Abd Acute W/chest  09/11/2012  *RADIOLOGY REPORT*  Clinical Data: Abdominal pain  ACUTE ABDOMEN SERIES (ABDOMEN 2 VIEW & CHEST 1 VIEW)  Comparison: 03/18/2009  Findings: Mild cardiac enlargement.  No pleural effusion or edema. No airspace consolidation.  No dilated loops of small bowel or air- fluid levels noted.  There is gas and stool noted within the colon. No free intraperitoneal air identified.  IMPRESSION:  1.  No acute cardiopulmonary abnormalities.   Original Report Authenticated By: Rosealee Albee, M.D.     EKG: Independently reviewed. Normal sinus Rhythm.  No Acute S-T segment changes  Assessment: Principal Problem:  *Acute pancreatitis Active Problems:  Abdominal pain, acute, epigastric  DIABETES MELLITUS, TYPE II  CORONARY ARTERY DISEASE  HYPERTENSION, BENIGN ESSENTIAL  Obesity   Plan:    Admit to Med/surg Bed NPO, IVFS Pain Control IV Protonix Reconcile Home Medications SSI coverage GI Consult in AM DVT Prophylaxis   Code Status: FULL CODE Family Communication: Discussed with Wife Disposition Plan: Return to Home  Time spent: 60 minutes  Ron [name] [facility] Pager [phone]  If 7PM-7AM, please contact night-coverage [email or web address] Password [email or web address] 09/12/2012, 2:32 AM

## 2012-09-13 ENCOUNTER — Inpatient Hospital Stay (HOSPITAL_COMMUNITY): Payer: Medicare Other

## 2012-09-13 ENCOUNTER — Encounter (HOSPITAL_COMMUNITY): Payer: Self-pay | Admitting: Physician Assistant

## 2012-09-13 DIAGNOSIS — I10 Essential (primary) hypertension: Secondary | ICD-10-CM

## 2012-09-13 DIAGNOSIS — I2510 Atherosclerotic heart disease of native coronary artery without angina pectoris: Secondary | ICD-10-CM

## 2012-09-13 DIAGNOSIS — K8010 Calculus of gallbladder with chronic cholecystitis without obstruction: Secondary | ICD-10-CM

## 2012-09-13 DIAGNOSIS — I509 Heart failure, unspecified: Secondary | ICD-10-CM

## 2012-09-13 DIAGNOSIS — R7401 Elevation of levels of liver transaminase levels: Secondary | ICD-10-CM

## 2012-09-13 DIAGNOSIS — K859 Acute pancreatitis without necrosis or infection, unspecified: Principal | ICD-10-CM

## 2012-09-13 DIAGNOSIS — R7402 Elevation of levels of lactic acid dehydrogenase (LDH): Secondary | ICD-10-CM

## 2012-09-13 DIAGNOSIS — R1013 Epigastric pain: Secondary | ICD-10-CM

## 2012-09-13 DIAGNOSIS — R52 Pain, unspecified: Secondary | ICD-10-CM

## 2012-09-13 LAB — COMPREHENSIVE METABOLIC PANEL
AST: 173 U/L — ABNORMAL HIGH (ref 0–37)
Albumin: 2.9 g/dL — ABNORMAL LOW (ref 3.5–5.2)
CO2: 22 mEq/L (ref 19–32)
Calcium: 8.3 mg/dL — ABNORMAL LOW (ref 8.4–10.5)
Creatinine, Ser: 0.8 mg/dL (ref 0.50–1.35)
GFR calc non Af Amer: >90 mL/min (ref >90)
Sodium: 137 mEq/L (ref 135–145)
Total Protein: 6.1 g/dL (ref 6.0–8.3)

## 2012-09-13 LAB — CBC WITH DIFFERENTIAL/PLATELET
Basophils Relative: 0 % (ref 0–1)
Eosinophils Relative: 1 % (ref 0–5)
HCT: 32.7 % — ABNORMAL LOW (ref 39.0–52.0)
Hemoglobin: 10.8 g/dL — ABNORMAL LOW (ref 13.0–17.0)
Lymphocytes Relative: 12 % (ref 12–46)
MCH: 22.9 pg — ABNORMAL LOW (ref 26.0–34.0)
Monocytes Absolute: 0.8 10*3/uL (ref 0.1–1.0)
Neutro Abs: 5.8 10*3/uL (ref 1.7–7.7)
Neutrophils Relative %: 76 % (ref 43–77)
RBC: 4.71 MIL/uL (ref 4.22–5.81)

## 2012-09-13 LAB — GLUCOSE, CAPILLARY: Glucose-Capillary: 172 mg/dL — ABNORMAL HIGH (ref 70–99)

## 2012-09-13 LAB — LIPID PANEL
Cholesterol: 153 mg/dL (ref 0–200)
HDL: 25 mg/dL — ABNORMAL LOW (ref >39)
Total CHOL/HDL Ratio: 6.1 RATIO

## 2012-09-13 LAB — RAPID URINE DRUG SCREEN, HOSP PERFORMED: Opiates: NOT DETECTED

## 2012-09-13 LAB — MAGNESIUM: Magnesium: 1.5 mg/dL (ref 1.5–2.5)

## 2012-09-13 MED ORDER — INSULIN ASPART 100 UNIT/ML ~~LOC~~ SOLN: 0.0000 [IU] | Freq: Every day | SUBCUTANEOUS | Status: DC

## 2012-09-13 MED ORDER — POLYMYXIN B-TRIMETHOPRIM 10000-0.1 UNIT/ML-% OP SOLN
1.0000 [drp] | Freq: Three times a day (TID) | OPHTHALMIC | Status: DC
  Administered 2012-09-13 – 2012-09-16 (×7): 1 [drp] via OPHTHALMIC
  Filled 2012-09-13: qty 10

## 2012-09-13 MED ORDER — PANTOPRAZOLE SODIUM 40 MG PO TBEC
40.0000 mg | DELAYED_RELEASE_TABLET | Freq: Every day | ORAL | Status: DC
  Administered 2012-09-14 – 2012-09-16 (×3): 40 mg via ORAL
  Filled 2012-09-13 (×3): qty 1

## 2012-09-13 MED ORDER — INSULIN ASPART 100 UNIT/ML ~~LOC~~ SOLN
0.0000 [IU] | Freq: Three times a day (TID) | SUBCUTANEOUS | Status: DC
  Administered 2012-09-13 – 2012-09-14 (×4): 4 [IU] via SUBCUTANEOUS
  Administered 2012-09-16: 3 [IU] via SUBCUTANEOUS
  Administered 2012-09-16: 4 [IU] via SUBCUTANEOUS

## 2012-09-13 MED ORDER — METHAZOLAMIDE 50 MG PO TABS
50.0000 mg | ORAL_TABLET | Freq: Three times a day (TID) | ORAL | Status: DC
  Administered 2012-09-13 – 2012-09-16 (×6): 50 mg via ORAL
  Filled 2012-09-13 (×11): qty 1

## 2012-09-13 NOTE — Consult Note (Signed)
Reason for Consult: Abdominal pain   Referring Physician:Jenkins [name] is an 61 y.o. male.  HPI: male with Multiple Medical problems who presents with complaints of sudden epigastric pain in the afternoon, that was described as constant sharp and stabbing, 10/10 pain, associated with nausea and vomiting. He reports not being able to hold down any foods or liquids. He was evaluated in the ED and found to have to a lipase level of 3000. Patient states that he ate a "liquid diet this morning.  Last BM yesterday.  CT of the abdomen:  IMPRESSION:  1. Acute pancreatitis. No evidence for pancreatic necrosis or  pseudocyst.  2. Tiny gallstones versus gallbladder sludge.  3. Fatty infiltration of the liver.   [place] of abdomen/pelvis: IMPRESSION:  Gallstones. No sonographic evidence for cholecystitis.  No proximal CBD dilatation. The distal duct is obscured.  Common bile duct: Normal proximal diameter at 5 mm.  The distal duct is obscured patient body habitus/bowel gas artifact. Pancreas inadequately visualized.  Hepatic steatosis. We are asked to see the patient on consult.  Past Medical History  Diagnosis Date  . Glaucoma   . Ulcer   . HTN (hypertension)   . Hypercholesterolemia   . Diabetes mellitus     Type II  . CHF (congestive heart failure)   . CAD (coronary artery disease)   . LBP (low back pain)   . Legally blind     Past Surgical History  Procedure Date  . Prosthetic cornea placement, right eye 2007    [facility]  . Lumbar fusion     Family History  Problem Relation Age of Onset  . Breast cancer Mother   . Colon cancer Mother   . Colon cancer Other     Elevated Risk for    Social History:  reports that he has never smoked. He does not have any smokeless tobacco history on file. He reports that he does not drink alcohol. His drug history not on file.  Allergies:  Allergies  Allergen Reactions  . Diamox (Acetazolamide) Itching  . Lisinopril Rash     Medications: I have reviewed the patient's current medications.  Results for orders placed during the hospital encounter of 09/11/12 (from the past 48 hour(s))  CBC WITH DIFFERENTIAL     Status: Abnormal   Collection Time   09/11/12  7:33 PM      Component Value Range Comment   WBC 14.2 (*) 4.0 - 10.5 K/uL    RBC 5.60  4.22 - 5.81 MIL/uL    Hemoglobin 13.3  13.0 - 17.0 g/dL    HCT 45.4  09.8 - 11.9 %    MCV 70.7 (*) 78.0 - 100.0 fL    MCH 23.8 (*) 26.0 - 34.0 pg    MCHC 33.6  30.0 - 36.0 g/dL    RDW 14.7  82.9 - 56.2 %    Platelets 211  150 - 400 K/uL    Neutrophils Relative 80 (*) 43 - 77 %    Neutro Abs 11.3 (*) 1.7 - 7.7 K/uL    Lymphocytes Relative 12  12 - 46 %    Lymphs Abs 1.8  0.7 - 4.0 K/uL    Monocytes Relative 7  3 - 12 %    Monocytes Absolute 1.0  0.1 - 1.0 K/uL    Eosinophils Relative 1  0 - 5 %    Eosinophils Absolute 0.1  0.0 - 0.7 K/uL    Basophils Relative 0  0 -  1 %    Basophils Absolute 0.0  0.0 - 0.1 K/uL   COMPREHENSIVE METABOLIC PANEL     Status: Abnormal   Collection Time   09/11/12  7:33 PM      Component Value Range Comment   Sodium 136  135 - 145 mEq/L    Potassium 4.0  3.5 - 5.1 mEq/L    Chloride 101  96 - 112 mEq/L    CO2 20  19 - 32 mEq/L    Glucose, Bld 199 (*) 70 - 99 mg/dL    BUN 18  6 - 23 mg/dL    Creatinine, Ser 1.61  0.50 - 1.35 mg/dL    Calcium 9.7  8.4 - 09.6 mg/dL    Total Protein 7.4  6.0 - 8.3 g/dL    Albumin 3.8  3.5 - 5.2 g/dL    AST 045 (*) 0 - 37 U/L    ALT 70 (*) 0 - 53 U/L    Alkaline Phosphatase 92  39 - 117 U/L    Total Bilirubin 1.8 (*) 0.3 - 1.2 mg/dL    GFR calc non Af Amer 88 (*) >90 mL/min    GFR calc Af Amer >90  >90 mL/min   LIPASE, BLOOD     Status: Abnormal   Collection Time   09/11/12  7:33 PM      Component Value Range Comment   Lipase >3000 (*) 11 - 59 U/L RESULT CONFIRMED BY AUTOMATED DILUTION  GLUCOSE, CAPILLARY     Status: Abnormal   Collection Time   09/11/12 11:26 PM      Component Value Range  Comment   Glucose-Capillary 237 (*) 70 - 99 mg/dL   URINALYSIS, ROUTINE W REFLEX MICROSCOPIC     Status: Abnormal   Collection Time   09/12/12  1:00 AM      Component Value Range Comment   Color, Urine YELLOW  YELLOW    APPearance CLEAR  CLEAR    Specific Gravity, Urine 1.038 (*) 1.005 - 1.030    pH 5.0  5.0 - 8.0    Glucose, UA >1000 (*) NEGATIVE mg/dL    Hgb urine dipstick NEGATIVE  NEGATIVE    Bilirubin Urine SMALL (*) NEGATIVE    Ketones, ur 15 (*) NEGATIVE mg/dL    Protein, ur NEGATIVE  NEGATIVE mg/dL    Urobilinogen, UA 1.0  0.0 - 1.0 mg/dL    Nitrite NEGATIVE  NEGATIVE    Leukocytes, UA NEGATIVE  NEGATIVE   URINE MICROSCOPIC-ADD ON     Status: Normal   Collection Time   09/12/12  1:00 AM      Component Value Range Comment   Squamous Epithelial / LPF RARE  RARE    WBC, UA 0-2  <3 WBC/hpf    RBC / HPF 0-2  <3 RBC/hpf    Bacteria, UA RARE  RARE   GLUCOSE, CAPILLARY     Status: Abnormal   Collection Time   09/12/12  3:08 AM      Component Value Range Comment   Glucose-Capillary 225 (*) 70 - 99 mg/dL   BASIC METABOLIC PANEL     Status: Abnormal   Collection Time   09/12/12  4:45 AM      Component Value Range Comment   Sodium 135  135 - 145 mEq/L    Potassium 4.1  3.5 - 5.1 mEq/L    Chloride 101  96 - 112 mEq/L    CO2 21  19 - 32 mEq/L  Glucose, Bld 245 (*) 70 - 99 mg/dL    BUN 16  6 - 23 mg/dL    Creatinine, Ser 1.61  0.50 - 1.35 mg/dL    Calcium 9.0  8.4 - 09.6 mg/dL    GFR calc non Af Amer >90  >90 mL/min    GFR calc Af Amer >90  >90 mL/min   CBC     Status: Abnormal   Collection Time   09/12/12  4:45 AM      Component Value Range Comment   WBC 7.9  4.0 - 10.5 K/uL    RBC 5.36  4.22 - 5.81 MIL/uL    Hemoglobin 12.4 (*) 13.0 - 17.0 g/dL    HCT 04.5 (*) 40.9 - 52.0 %    MCV 71.5 (*) 78.0 - 100.0 fL    MCH 23.1 (*) 26.0 - 34.0 pg    MCHC 32.4  30.0 - 36.0 g/dL    RDW 81.1  91.4 - 78.2 %    Platelets 209  150 - 400 K/uL   GLUCOSE, CAPILLARY     Status: Abnormal    Collection Time   09/12/12  8:26 AM      Component Value Range Comment   Glucose-Capillary 179 (*) 70 - 99 mg/dL    Comment 1 Documented in Chart      Comment 2 Notify RN     GLUCOSE, CAPILLARY     Status: Abnormal   Collection Time   09/12/12 11:47 AM      Component Value Range Comment   Glucose-Capillary 191 (*) 70 - 99 mg/dL    Comment 1 Documented in Chart      Comment 2 Notify RN     GLUCOSE, CAPILLARY     Status: Abnormal   Collection Time   09/12/12  4:02 PM      Component Value Range Comment   Glucose-Capillary 179 (*) 70 - 99 mg/dL    Comment 1 Notify RN     GLUCOSE, CAPILLARY     Status: Abnormal   Collection Time   09/12/12  9:59 PM      Component Value Range Comment   Glucose-Capillary 207 (*) 70 - 99 mg/dL   URINE RAPID DRUG SCREEN (HOSP PERFORMED)     Status: Normal   Collection Time   09/13/12  4:41 AM      Component Value Range Comment   Opiates NONE DETECTED  NONE DETECTED    Cocaine NONE DETECTED  NONE DETECTED    Benzodiazepines NONE DETECTED  NONE DETECTED    Amphetamines NONE DETECTED  NONE DETECTED    Tetrahydrocannabinol NONE DETECTED  NONE DETECTED    Barbiturates NONE DETECTED  NONE DETECTED   COMPREHENSIVE METABOLIC PANEL     Status: Abnormal   Collection Time   09/13/12  6:30 AM      Component Value Range Comment   Sodium 137  135 - 145 mEq/L    Potassium 3.6  3.5 - 5.1 mEq/L    Chloride 105  96 - 112 mEq/L    CO2 22  19 - 32 mEq/L    Glucose, Bld 172 (*) 70 - 99 mg/dL    BUN 11  6 - 23 mg/dL    Creatinine, Ser 9.56  0.50 - 1.35 mg/dL    Calcium 8.3 (*) 8.4 - 10.5 mg/dL    Total Protein 6.1  6.0 - 8.3 g/dL    Albumin 2.9 (*) 3.5 - 5.2 g/dL    AST  173 (*) 0 - 37 U/L    ALT 224 (*) 0 - 53 U/L    Alkaline Phosphatase 115  39 - 117 U/L    Total Bilirubin 3.0 (*) 0.3 - 1.2 mg/dL    GFR calc non Af Amer >90  >90 mL/min    GFR calc Af Amer >90  >90 mL/min   CBC WITH DIFFERENTIAL     Status: Abnormal   Collection Time   09/13/12  6:30 AM       Component Value Range Comment   WBC 7.6  4.0 - 10.5 K/uL    RBC 4.71  4.22 - 5.81 MIL/uL    Hemoglobin 10.8 (*) 13.0 - 17.0 g/dL    HCT 40.9 (*) 81.1 - 52.0 %    MCV 69.4 (*) 78.0 - 100.0 fL    MCH 22.9 (*) 26.0 - 34.0 pg    MCHC 33.0  30.0 - 36.0 g/dL    RDW 91.4  78.2 - 95.6 %    Platelets 186  150 - 400 K/uL    Neutrophils Relative 76  43 - 77 %    Lymphocytes Relative 12  12 - 46 %    Monocytes Relative 11  3 - 12 %    Eosinophils Relative 1  0 - 5 %    Basophils Relative 0  0 - 1 %    Neutro Abs 5.8  1.7 - 7.7 K/uL    Lymphs Abs 0.9  0.7 - 4.0 K/uL    Monocytes Absolute 0.8  0.1 - 1.0 K/uL    Eosinophils Absolute 0.1  0.0 - 0.7 K/uL    Basophils Absolute 0.0  0.0 - 0.1 K/uL    RBC Morphology POLYCHROMASIA PRESENT     MAGNESIUM     Status: Normal   Collection Time   09/13/12  6:30 AM      Component Value Range Comment   Magnesium 1.5  1.5 - 2.5 mg/dL   LIPID PANEL     Status: Abnormal   Collection Time   09/13/12  6:30 AM      Component Value Range Comment   Cholesterol 153  0 - 200 mg/dL    Triglycerides 99  <213 mg/dL    HDL 25 (*) >08 mg/dL    Total CHOL/HDL Ratio 6.1      VLDL 20  0 - 40 mg/dL    LDL Cholesterol 657 (*) 0 - 99 mg/dL   GLUCOSE, CAPILLARY     Status: Abnormal   Collection Time   09/13/12  8:12 AM      Component Value Range Comment   Glucose-Capillary 149 (*) 70 - 99 mg/dL    Comment 1 Notify RN       US Abdomen Complete  09/13/2012  *RADIOLOGY REPORT*  Clinical Data:  Pancreatitis, gallstones.  COMPLETE ABDOMINAL ULTRASOUND  Comparison:  09/12/2012 CT  Findings: Technically challenging examination due to patient body habitus and bowel gas artifact, resulting in limited acoustic windows.  Gallbladder:  Gallstones.  No wall thickening or pericholecystic fluid.  Negative sonographic Murphy's sign.  Common bile duct:  Normal proximal diameter at 5 mm.  The distal duct is obscured patient body habitus/bowel gas artifact.  Liver:  Diffusely increased in  echogenicity.  No focal abnormality identified.  IVC:  Appears normal.  Pancreas:  Inadequately visualized due to patient body habitus/bowel gas artifact.  Spleen:  Measures 6 cm oblique.  No focal abnormality.  Right Kidney:  Measures 11.7 cm.  No hydronephrosis or focal abnormality.  Portions of the parenchyma are incompletely visualized due to limited acoustic windows.  Left Kidney:  Measures 10.8 cm.  No hydronephrosis or focal abnormality.  Parenchymal evaluation is suboptimal due to patient body habitus and limited acoustic windows.  Abdominal aorta:  Inadequately visualized.  No aneurysmal dilatation on recent CT.  IMPRESSION:  Gallstones.  No sonographic evidence for cholecystitis.  No proximal CBD dilatation.  The distal duct is obscured.  Pancreas inadequately visualized.  Hepatic steatosis.   Original Report Authenticated By: Waneta Martins, M.D.    Ct Abdomen Pelvis W Contrast  09/12/2012  *RADIOLOGY REPORT*  Clinical Data: elevated lipase rule out pancreatitis ABDOMINAL PAIN EMESIS  CT ABDOMEN AND PELVIS WITH CONTRAST  Technique:  Multidetector CT imaging of the abdomen and pelvis was performed following the standard protocol during bolus administration of intravenous contrast.  Contrast: OMNIPAQUE IOHEXOL 300 MG/ML  SOLN  Comparison: None.  Findings: Calcifications involving the LAD, RCA and left circumflex coronary arteries noted.  No pericardial or pleural effusions.  There is mild fatty infiltration of the liver.  No suspicious liver abnormalities. Multiple tiny stones versus gallbladder sludge noted within the lumen of the gallbladder.  No gallbladder wall thickening.  No biliary dilatation.  Edema of the pancreatic parenchyma with peripancreatic fat stranding is noted consistent with pancreatitis.  No evidence for pancreatic necrosis or pseudocyst.  No fluid collections noted within the upper abdomen. The spleen is normal.  Both adrenal glands are normal.  Normal appearance of the  right kidney.  The left kidney is normal.  The urinary bladder is within normal limits.  Prostate gland is enlarged measuring 4.4 cm in transverse dimension, image 87.  The seminal vesicles have a symmetric appearance.  No enlarged upper abdominal lymph nodes.  There is no pelvic or inguinal adenopathy.  The stomach appears normal.  The small bowel loops are unremarkable.  The appendix is noted and appears normal.  The normal appearance of the colon.  Multiple sigmoid diverticula identified without acute inflammation.  Review of the visualized osseous structures is significant for multilevel lumbar degenerative disc disease.  IMPRESSION:  1.  Acute pancreatitis.  No evidence for pancreatic necrosis or pseudocyst.  2.  Tiny gallstones versus gallbladder sludge.  3.  Fatty infiltration of the liver.   Original Report Authenticated By: Rosealee Albee, M.D.    Dg Abd Acute W/chest  09/11/2012  *RADIOLOGY REPORT*  Clinical Data: Abdominal pain  ACUTE ABDOMEN SERIES (ABDOMEN 2 VIEW & CHEST 1 VIEW)  Comparison: 03/18/2009  Findings: Mild cardiac enlargement.  No pleural effusion or edema. No airspace consolidation.  No dilated loops of small bowel or air- fluid levels noted.  There is gas and stool noted within the colon. No free intraperitoneal air identified.  IMPRESSION:  1.  No acute cardiopulmonary abnormalities.   Original Report Authenticated By: Rosealee Albee, M.D.     Review of Systems  Constitutional: Negative.   HENT: Negative.   Eyes:       Patient is Blind  Respiratory: Negative.   Cardiovascular: Negative.   Gastrointestinal: Positive for abdominal pain. Negative for heartburn, nausea, vomiting, diarrhea, constipation, blood in stool and melena.  Genitourinary: Negative.   Musculoskeletal: Negative.   Skin: Negative.   Neurological: Negative.   Endo/Heme/Allergies: Negative.   Psychiatric/Behavioral: Negative.    Blood pressure 110/52, pulse 81, temperature 98.9 F (37.2 C),  temperature source Oral, resp. rate 18, height 5\' 10"  (1.778 m), weight  249 lb 5.4 oz (113.1 kg), SpO2 98.00%. Physical Exam  Constitutional: He is oriented to person, place, and time. He appears well-developed and well-nourished. No distress.  HENT:  Head: Normocephalic and atraumatic.  Mouth/Throat: No oropharyngeal exudate.  Eyes: Right eye exhibits no discharge. Left eye exhibits no discharge. No scleral icterus.       Patient is legally blind  Neck: Normal range of motion. Neck supple. No JVD present. No tracheal deviation present. No thyromegaly present.  Cardiovascular: Normal rate, regular rhythm and normal heart sounds.  Exam reveals no gallop and no friction rub.   No murmur heard. Respiratory: Effort normal and breath sounds normal. No stridor.  GI: Soft. Bowel sounds are normal. He exhibits no distension and no mass. There is tenderness. There is no rebound and no guarding.  Musculoskeletal: Normal range of motion. He exhibits no edema and no tenderness.  Lymphadenopathy:    He has no cervical adenopathy.  Neurological: He is alert and oriented to person, place, and time.  Skin: Skin is warm and dry. No rash noted. He is not diaphoretic. No erythema. No pallor.  Psychiatric: He has a normal mood and affect.    Assessment/Plan:  Patient Active Problem List  Diagnosis  . DIABETES MELLITUS, TYPE II  . HYPERKALEMIA  . CORONARY ARTERY DISEASE  . CONGESTIVE HEART FAILURE  . LOW BACK PAIN  . HYPERTENSION, BENIGN ESSENTIAL  . Obesity  . ED (erectile dysfunction)  . Pernicious anemia  . BPH (benign prostatic hyperplasia)  . Dyspnea on exertion  . Pure hypercholesterolemia  . Screening, ischemic heart disease  . Routine general medical examination at a health care facility  . Legally blind  . Acute pancreatitis  . Abdominal pain, acute, epigastric  Cholelithiasis Acute pancreatitis secondary to pancreatic ductal stone blockage.   Plan:  Agree with NPO, GI consult, IVF   Recommend changing po meds to IV where possible Follow labs. If Tbili continues to climb he made need an ERCP/MRCP Once patients pancreatitis resolves we can move forward with an elective cholecystectomy with intraoperative cholangiogram. Would hold off on ABX for now. (Will need pre-op though) Management of his numerous other medical issues per medicine team.  Thank you for this consult, we will follow along with you.   Blenda Mounts ACNP [facility] Pager [phone]   09/13/2012, 11:11 AM

## 2012-09-13 NOTE — Consult Note (Signed)
Gastroenterology Consult 2:07 PM 09/13/2012   Referring [name]: [name]  Primary Care Physician:  Elijah Linger, MD Primary Gastroenterologist:  Elijah Bunting, MD   Reason for Consultation:  pancreatitis.   HPI: [name] is a 61 y.o. male.  Pt is blind.  Hx of adenomatous colon polyps in 2011. Has IDDM.  Remote surgery for bleeding when pt was in his 19s.  Admitted on 9/21 with acute pancreatitis. Lipase > 3000 now 39, AST 140 now 173, ALT 70 now 224.  T bili and alk phos normal.  CT and ultrasound show GB sludge, 5 mm CBD, fatty liver.  Abdominal pain and n/v that began on 9/21 have improved on Protonix, no abx.  Surgery requests GI eval to consider ERCP.     ENDOSCOPIC STUDIES: 10/2010  Colonoscopy  Elijah [name] 1) Two small polyps, both removed and both sent to pathology  TUBULAR ADENOMA (TWO FRAGMENTS). NO HIGH GRADE DYSPLASIA OR MALIGNANCY IDENTIFIED. 2) Mild diverticulosis in the sigmoid to descending colon  segments  3) Otherwise normal examination   Past Medical History  Diagnosis Date  . Glaucoma   . Ulcer   . HTN (hypertension)   . Hypercholesterolemia   . Diabetes mellitus     Type II  . CHF (congestive heart failure)   . CAD (coronary artery disease)   . LBP (low back pain)   . Legally blind     Past Surgical History  Procedure Date  . Prosthetic cornea placement, right eye 2007    [facility]  . Lumbar fusion     Prior to Admission medications   Medication Sig Start Date End Date Taking? Authorizing [name]  carvedilol (COREG) 12.5 MG tablet Take 12.5 mg by mouth 2 (two) times daily with a meal.     Yes Elijah Provider, MD  Colesevelam HCl 3.75 G PACK Take 1 each by mouth daily. 04/02/12  Yes Elijah Grandchild, MD  dorzolamide-timolol (COSOPT) 22.3-6.8 MG/ML ophthalmic solution Place 1 drop into the right eye 2 (two) times daily.     Yes Elijah Provider, MD  furosemide (LASIX) 40 MG tablet Take 40  mg by mouth daily.    Yes Elijah Provider, MD  insulin lispro protamine-insulin lispro (HUMALOG 75/25) (75-25) 100 UNIT/ML SUSP Inject 45-85 Units into the skin 2 (two) times daily with a meal. Takes 85 units in the am & 45 units in the pm   Yes Elijah Provider, MD  isosorbide mononitrate (IMDUR) 60 MG 24 hr tablet Take 90 mg by mouth daily.    Yes Elijah Provider, MD  metFORMIN (GLUCOPHAGE) 1000 MG tablet Take 1,000 mg by mouth 2 (two) times daily with a meal.   Yes Elijah Provider, MD  methazolamide (NEPTAZANE) 50 MG tablet Take 50 mg by mouth 3 (three) times daily.     Yes Elijah Provider, MD  omega-3 acid ethyl esters (LOVAZA) 1 G capsule Take 1 g by mouth 2 (two) times daily.   Yes Elijah Provider, MD  pravastatin (PRAVACHOL) 40 MG tablet Take 40 mg by mouth daily.   Yes Elijah Provider, MD  prednisoLONE acetate (PRED FORTE) 1 % ophthalmic suspension Place 1 drop into the right eye 3 (three) times daily.   Yes Elijah Provider, MD  ranitidine (ZANTAC) 150 MG tablet Take 150 mg by mouth 2 (two) times daily.   Yes Elijah Provider, MD  sitaGLIPtin (JANUVIA) 100 MG tablet Take 100 mg by mouth daily.   Yes Elijah Provider, MD  telmisartan (MICARDIS)  40 MG tablet Take 40 mg by mouth daily.   Yes Elijah Provider, MD    Scheduled Meds:    . carvedilol  12.5 mg Oral BID WC  . dorzolamide-timolol  1 drop Right Eye BID  . enoxaparin (LOVENOX) injection  40 mg Subcutaneous Daily  . insulin aspart  0-20 Units Subcutaneous TID WC  . insulin aspart  0-5 Units Subcutaneous QHS  . isosorbide mononitrate  90 mg Oral Daily  . pantoprazole (PROTONIX) IV  40 mg Intravenous Q12H  . pneumococcal 23 valent vaccine  0.5 mL Intramuscular Tomorrow-1000  . prednisoLONE acetate  1 drop Right Eye TID  . DISCONTD: insulin aspart  0-15 Units Subcutaneous TID WC  . DISCONTD: insulin aspart  0-5 Units Subcutaneous QHS   Infusions:    . sodium chloride 100 mL/hr at  09/13/12 0032   PRN Meds: acetaminophen, acetaminophen, alum & mag hydroxide-simeth, HYDROmorphone (DILAUDID) injection, ondansetron (ZOFRAN) IV, ondansetron, oxyCODONE, zolpidem   Allergies as of 09/11/2012 - Review Complete 09/11/2012  Allergen Reaction Noted  . Diamox (acetazolamide) Itching 09/11/2012  . Lisinopril Rash 12/18/2010    Family History  Problem Relation Age of Onset  . Breast cancer Mother   . Colon cancer Mother   . Colon cancer Other     Elevated Risk for    History   Social History  . Marital Status: Married    Spouse Name: N/A    Number of Children: N/A  . Years of Education: N/A   Occupational History  . disabled     blind   Social History Main Topics  . Smoking status: Never Smoker   . Smokeless tobacco: No  . Alcohol Use: No  . Drug Use: No  . Sexually Active: Not Currently    Social History Narrative   Occupation: disabled, blindMarriedRegular Exercise-no    REVIEW OF SYSTEMS: In 2 or so years weight went from 280s to 250 # No rash, sores or itching. No teeth.  Full dentures, the top plate fell out while he was flushing the toilet over this past weekend.  No constipation.  Unable to say what is his stool color.  Sugars on Sat at home was 125.  No fainting.  No falls No oliguria, dysuria.  No dysphagia.  O/w 12 system review is negative.  No joint pain, no pedal edema No chest pain or palpitations. No cough or SOB   PHYSICAL EXAM: Vital signs in last 24 hours: Temp:  [97.5 F (36.4 C)-99.2 F (37.3 C)] 99.2 F (37.3 C) (09/23 1030) Pulse Rate:  [63-91] 84  (09/23 1030) Resp:  [16-20] 20  (09/23 1030) BP: (107-112)/(50-70) 112/70 mmHg (09/23 1030) SpO2:  [96 %-99 %] 96 % (09/23 1030)  General: obese, pleasant [name].  Looks young for age Head:  No asymmetry  Eyes:  Mild icterus  Ears:  Not HOH  Nose:  No discharge Mouth:  Halitosis, no teeth.  Marked geographic tongue. Neck:  No mass, no bruits, no TMG Lungs:  Clear.  No  resp distress Heart: RRR.  No MRG Abdomen:  Active BS, ND.  Slight tenderness at epigastrum.  No mass or organomegaly.  No bruits.  Old upper midline surgery scar, well healed with slight assocated keloid formation.  Rectal: deferred   Musc/Skeltl: no joint redness or swelling Extremities:  No pedal edema.  Pedal pulses 3 plus  Neurologic:  Oriented x 3.  Good historian.  No gross deficits.  No tremor Skin:  No rash or sores  Tattoos:  none   Psych:  Pleasant, cooperative, relaxed.   LAB RESULTS:  Basename 09/13/12 0630 09/12/12 0445 09/11/12 1933  WBC 7.6 7.9 14.2*  HGB 10.8* 12.4* 13.3  HCT 32.7* 38.3* 39.6  PLT 186 209 211  MCV           69.4  BMET Lab Results  Component Value Date   NA 137 09/13/2012   NA 135 09/12/2012   NA 136 09/11/2012   K 3.6 09/13/2012   K 4.1 09/12/2012   K 4.0 09/11/2012   CL 105 09/13/2012   CL 101 09/12/2012   CL 101 09/11/2012   CO2 22 09/13/2012   CO2 21 09/12/2012   CO2 20 09/11/2012   GLUCOSE 172* 09/13/2012   GLUCOSE 245* 09/12/2012   GLUCOSE 199* 09/11/2012   BUN 11 09/13/2012   BUN 16 09/12/2012   BUN 18 09/11/2012   CREATININE 0.80 09/13/2012   CREATININE 0.83 09/12/2012   CREATININE 0.91 09/11/2012   CALCIUM 8.3* 09/13/2012   CALCIUM 9.0 09/12/2012   CALCIUM 9.7 09/11/2012   LFT  Basename 09/13/12 0630 09/11/12 1933  PROT 6.1 7.4  ALBUMIN 2.9* 3.8  AST 173* 140*  ALT 224* 70*  ALKPHOS 115 92  BILITOT 3.0* 1.8*  BILIDIR -- --  IBILI -- --   Lipase         39                  >3000   RADIOLOGY STUDIES: 09/13/2012   COMPLETE ABDOMINAL ULTRASOUND  Findings: Technically challenging examination due to patient body habitus and bowel gas artifact, resulting in limited acoustic windows.  Gallbladder:  Gallstones.  No wall thickening or pericholecystic fluid.  Negative sonographic Murphy's sign.  Common bile duct:  Normal proximal diameter at 5 mm.  The distal duct is obscured patient body habitus/bowel gas artifact.  Liver:  Diffusely increased in  echogenicity.  No focal abnormality identified.  IVC:  Appears normal.  Pancreas:  Inadequately visualized due to patient body habitus/bowel gas artifact.  Spleen:  Measures 6 cm oblique.  No focal abnormality.  Right Kidney:  Measures 11.7 cm.  No hydronephrosis or focal abnormality.  Portions of the parenchyma are incompletely visualized due to limited acoustic windows.  Left Kidney:  Measures 10.8 cm.  No hydronephrosis or focal abnormality.  Parenchymal evaluation is suboptimal due to patient body habitus and limited acoustic windows.  Abdominal aorta:  Inadequately visualized.  No aneurysmal dilatation on recent CT.  IMPRESSION:  Gallstones.  No sonographic evidence for cholecystitis.  No proximal CBD dilatation.  The distal duct is obscured.  Pancreas inadequately visualized.  Hepatic steatosis.   Original Report Authenticated By: Waneta Martins, M.D.     09/12/2012  CT ABDOMEN AND PELVIS WITH CONTRAST     Findings: Calcifications involving the LAD, RCA and left circumflex coronary arteries noted.  No pericardial or pleural effusions.  There is mild fatty infiltration of the liver.  No suspicious liver abnormalities. Multiple tiny stones versus gallbladder sludge noted within the lumen of the gallbladder.  No gallbladder wall thickening.  No biliary dilatation.  Edema of the pancreatic parenchyma with peripancreatic fat stranding is noted consistent with pancreatitis.  No evidence for pancreatic necrosis or pseudocyst.  No fluid collections noted within the upper abdomen. The spleen is normal.  Both adrenal glands are normal.  Normal appearance of the right kidney.  The left kidney is normal.  The urinary bladder is within normal limits.  Prostate gland is enlarged measuring 4.4 cm in transverse dimension, image 87.  The seminal vesicles have a symmetric appearance.  No enlarged upper abdominal lymph nodes.  There is no pelvic or inguinal adenopathy.  The stomach appears normal.  The small bowel loops  are unremarkable.  The appendix is noted and appears normal.  The normal appearance of the colon.  Multiple sigmoid diverticula identified without acute inflammation.  Review of the visualized osseous structures is significant for multilevel lumbar degenerative disc disease.  IMPRESSION:  1.  Acute pancreatitis.  No evidence for pancreatic necrosis or pseudocyst.  2.  Tiny gallstones versus gallbladder sludge.  3.  Fatty infiltration of the liver.   Original Report Authenticated By: Rosealee Albee, M.D.    Dg Abd Acute W/chest 09/11/2012  *RADIOLOGY REPORT*  Clinical Data: Abdominal pain  ACUTE ABDOMEN SERIES (ABDOMEN 2 VIEW & CHEST 1 VIEW)  Comparison: 03/18/2009  Findings: Mild cardiac enlargement.  No pleural effusion or edema. No airspace consolidation.  No dilated loops of small bowel or air- fluid levels noted.  There is gas and stool noted within the colon. No free intraperitoneal air identified.  IMPRESSION:  1.  No acute cardiopulmonary abnormalities.   Original Report Authenticated By: Rosealee Albee, M.D.     IMPRESSION: *  Acute biliary pancreatitis. Clinically improved, but LFTs rising.  Imaging does not show ductal dilatation.   *  Blind  *  IDDM *  Remote surgery for bleeding PUD when pt was in his 15s. *  Microcytic anemia. *  Adenomatous colon polyps in 10/2010.  PLAN: *  Per Elijah Russella Dar:  Repeat LFTs in AM.  Will pusue ERCP if consistently rising LFTs.  *  Will allow clears now, as he tolerated these this morning.    LOS: 2 days   Jennye Moccasin  09/13/2012, 2:07 PM Pager: [phone]   I have taken a history, examined the patient and reviewed the chart. I agree with the extender's note, impression and recommendations. Acute biliary pancreatitis with cholelithiasis and elevated LFTs but no biliary ductal dilation. Continue supportive care for pancreatitis. Monitor LFTs and if the trend is improving I would favor lap chole with IOC. If LFTs continue to rise or do not drop ERCP  before lap chole.  Meryl Dare MD [facility]

## 2012-09-13 NOTE — Progress Notes (Signed)
[facility] PROGRESS NOTE  TIA GELB WUJ:[id number] DOB: 1951-06-20 DOA: 09/11/2012 PCP: Sanda Linger, MD  Assessment/Plan: Acute pancreatitis  -Likely gallstone pancreatitis  -Cannot rule out medication-induced, particularly furosemide  -Remain off antibiotics, afebrile, clinically stable, no leukocytosis -Abdominal ultrasound (9/23) revealed gallstones without findings of acute cholecystitis nor common bile duct dilatation -Urine drug screen negative  -Check lipids unremarkable -Continue IV fluids, clear liquid diet as tolerated  -Consulted surgery, appreciate recommendations -Consult gastroenterology--told me to discontinue MRCP until patient is evaluated Diabetes mellitus type 2  -Hold oral hypoglycemics at this time  -Continue NovoLog sliding scale  -Check hemoglobin A1c  -Check TSH, lipids  Coronary artery disease  -Continue Coreg and Imdur  Hyperlipidemia  -Restart Pravachol, WelChol, and Lovaza when tolerating diet  Hypertension  -Controlled at this time    Disposition Plan:   Home when medically stable         Procedures/Studies: US Abdomen Complete  09/13/2012  *RADIOLOGY REPORT*  Clinical Data:  Pancreatitis, gallstones.  COMPLETE ABDOMINAL ULTRASOUND  Comparison:  09/12/2012 CT  Findings: Technically challenging examination due to patient body habitus and bowel gas artifact, resulting in limited acoustic windows.  Gallbladder:  Gallstones.  No wall thickening or pericholecystic fluid.  Negative sonographic Murphy's sign.  Common bile duct:  Normal proximal diameter at 5 mm.  The distal duct is obscured patient body habitus/bowel gas artifact.  Liver:  Diffusely increased in echogenicity.  No focal abnormality identified.  IVC:  Appears normal.  Pancreas:  Inadequately visualized due to patient body habitus/bowel gas artifact.  Spleen:  Measures 6 cm oblique.  No focal abnormality.  Right Kidney:  Measures 11.7 cm.  No hydronephrosis or focal abnormality.   Portions of the parenchyma are incompletely visualized due to limited acoustic windows.  Left Kidney:  Measures 10.8 cm.  No hydronephrosis or focal abnormality.  Parenchymal evaluation is suboptimal due to patient body habitus and limited acoustic windows.  Abdominal aorta:  Inadequately visualized.  No aneurysmal dilatation on recent CT.  IMPRESSION:  Gallstones.  No sonographic evidence for cholecystitis.  No proximal CBD dilatation.  The distal duct is obscured.  Pancreas inadequately visualized.  Hepatic steatosis.   Original Report Authenticated By: Waneta Martins, M.D.    Ct Abdomen Pelvis W Contrast  09/12/2012  *RADIOLOGY REPORT*  Clinical Data: elevated lipase rule out pancreatitis ABDOMINAL PAIN EMESIS  CT ABDOMEN AND PELVIS WITH CONTRAST  Technique:  Multidetector CT imaging of the abdomen and pelvis was performed following the standard protocol during bolus administration of intravenous contrast.  Contrast: OMNIPAQUE IOHEXOL 300 MG/ML  SOLN  Comparison: None.  Findings: Calcifications involving the LAD, RCA and left circumflex coronary arteries noted.  No pericardial or pleural effusions.  There is mild fatty infiltration of the liver.  No suspicious liver abnormalities. Multiple tiny stones versus gallbladder sludge noted within the lumen of the gallbladder.  No gallbladder wall thickening.  No biliary dilatation.  Edema of the pancreatic parenchyma with peripancreatic fat stranding is noted consistent with pancreatitis.  No evidence for pancreatic necrosis or pseudocyst.  No fluid collections noted within the upper abdomen. The spleen is normal.  Both adrenal glands are normal.  Normal appearance of the right kidney.  The left kidney is normal.  The urinary bladder is within normal limits.  Prostate gland is enlarged measuring 4.4 cm in transverse dimension, image 87.  The seminal vesicles have a symmetric appearance.  No enlarged upper abdominal lymph nodes.  There is no pelvic  or  inguinal adenopathy.  The stomach appears normal.  The small bowel loops are unremarkable.  The appendix is noted and appears normal.  The normal appearance of the colon.  Multiple sigmoid diverticula identified without acute inflammation.  Review of the visualized osseous structures is significant for multilevel lumbar degenerative disc disease.  IMPRESSION:  1.  Acute pancreatitis.  No evidence for pancreatic necrosis or pseudocyst.  2.  Tiny gallstones versus gallbladder sludge.  3.  Fatty infiltration of the liver.   Original Report Authenticated By: Rosealee Albee, M.D.    Dg Abd Acute W/chest  09/11/2012  *RADIOLOGY REPORT*  Clinical Data: Abdominal pain  ACUTE ABDOMEN SERIES (ABDOMEN 2 VIEW & CHEST 1 VIEW)  Comparison: 03/18/2009  Findings: Mild cardiac enlargement.  No pleural effusion or edema. No airspace consolidation.  No dilated loops of small bowel or air- fluid levels noted.  There is gas and stool noted within the colon. No free intraperitoneal air identified.  IMPRESSION:  1.  No acute cardiopulmonary abnormalities.   Original Report Authenticated By: Rosealee Albee, M.D.          Subjective: Patient is feeling better today. He actually wants to keep. Denies any chest pain, shortness breath, nausea, vomiting, abdominal pain, fevers, chills, dizziness. He denies any rashes or dysuria.  Objective: Filed Vitals:   09/12/12 1824 09/12/12 2203 09/13/12 0147 09/13/12 0555  BP: 107/54 111/59 109/50 110/52  Pulse: 91 85 63 81  Temp: 98.2 F (36.8 C) 97.5 F (36.4 C) 98.6 F (37 C) 98.9 F (37.2 C)  TempSrc: Oral     Resp: 16 18 18 18   Height:      Weight:      SpO2: 96% 98% 99% 98%    Intake/Output Summary (Last 24 hours) at 09/13/12 1257 Last data filed at 09/12/12 2206  Gross per 24 hour  Intake    890 ml  Output    800 ml  Net     90 ml   Weight change:  Exam:   General:  Pt is alert, follows commands appropriately, not in acute distress  HEENT: No icterus,  No thrush, Barrett/AT  Cardiovascular: RRR, S1/S2, no rubs, no gallops  Respiratory: Clear to auscultation bilaterally, no wheezing, no crackles, no rhonchi  Abdomen: Soft/+BS, non tender, non distended, no guarding  Extremities: trace edema, No lymphangitis, No petechiae, No rashes, no synovitis  Data Reviewed: Basic Metabolic Panel:  Lab 09/13/12 4098 09/12/12 0445 09/11/12 1933  NA 137 135 136  K 3.6 4.1 4.0  CL 105 101 101  CO2 22 21 20   GLUCOSE 172* 245* 199*  BUN 11 16 18   CREATININE 0.80 0.83 0.91  CALCIUM 8.3* 9.0 9.7  MG 1.5 -- --  PHOS -- -- --   Liver Function Tests:  Lab 09/13/12 0630 09/11/12 1933  AST 173* 140*  ALT 224* 70*  ALKPHOS 115 92  BILITOT 3.0* 1.8*  PROT 6.1 7.4  ALBUMIN 2.9* 3.8    Lab 09/11/12 1933  LIPASE >3000*  AMYLASE --   No results found for this basename: AMMONIA:5 in the last 168 hours CBC:  Lab 09/13/12 0630 09/12/12 0445 09/11/12 1933  WBC 7.6 7.9 14.2*  NEUTROABS 5.8 -- 11.3*  HGB 10.8* 12.4* 13.3  HCT 32.7* 38.3* 39.6  MCV 69.4* 71.5* 70.7*  PLT 186 209 211   Cardiac Enzymes: No results found for this basename: CKTOTAL:5,CKMB:5,CKMBINDEX:5,TROPONINI:5 in the last 168 hours BNP: No components found with this basename: POCBNP:5  CBG:  Lab 09/13/12 1252 09/13/12 0812 09/12/12 2159 09/12/12 1602 09/12/12 1147  GLUCAP 183* 149* 207* 179* 191*    No results found for this or any previous visit (from the past 240 hour(s)).   Scheduled Meds:   . carvedilol  12.5 mg Oral BID WC  . dorzolamide-timolol  1 drop Right Eye BID  . enoxaparin (LOVENOX) injection  40 mg Subcutaneous Daily  . insulin aspart  0-15 Units Subcutaneous TID WC  . insulin aspart  0-5 Units Subcutaneous QHS  . isosorbide mononitrate  90 mg Oral Daily  . pantoprazole (PROTONIX) IV  40 mg Intravenous Q12H  . pneumococcal 23 valent vaccine  0.5 mL Intramuscular Tomorrow-1000  . prednisoLONE acetate  1 drop Right Eye TID   Continuous Infusions:   .  sodium chloride 100 mL/hr at 09/13/12 0032     Mackayla Mullins, DO  [facility] Pager [phone]  If 7PM-7AM, please contact night-coverage [email or web address] Password [facility] 09/13/2012, 12:57 PM   LOS: 2 days

## 2012-09-14 LAB — COMPREHENSIVE METABOLIC PANEL
ALT: 146 U/L — ABNORMAL HIGH (ref 0–53)
AST: 70 U/L — ABNORMAL HIGH (ref 0–37)
Albumin: 3 g/dL — ABNORMAL LOW (ref 3.5–5.2)
CO2: 22 mEq/L (ref 19–32)
Calcium: 8.4 mg/dL (ref 8.4–10.5)
Chloride: 105 mEq/L (ref 96–112)
Creatinine, Ser: 0.79 mg/dL (ref 0.50–1.35)
Sodium: 137 mEq/L (ref 135–145)
Total Bilirubin: 1.9 mg/dL — ABNORMAL HIGH (ref 0.3–1.2)

## 2012-09-14 LAB — CBC
HCT: 33.1 % — ABNORMAL LOW (ref 39.0–52.0)
Hemoglobin: 10.7 g/dL — ABNORMAL LOW (ref 13.0–17.0)
MCH: 22.9 pg — ABNORMAL LOW (ref 26.0–34.0)
MCHC: 32.3 g/dL (ref 30.0–36.0)
MCV: 70.7 fL — ABNORMAL LOW (ref 78.0–100.0)
Platelets: 191 10*3/uL (ref 150–400)
RBC: 4.68 MIL/uL (ref 4.22–5.81)
RDW: 14.5 % (ref 11.5–15.5)
WBC: 7.7 10*3/uL (ref 4.0–10.5)

## 2012-09-14 LAB — GLUCOSE, CAPILLARY
Glucose-Capillary: 165 mg/dL — ABNORMAL HIGH (ref 70–99)
Glucose-Capillary: 170 mg/dL — ABNORMAL HIGH (ref 70–99)
Glucose-Capillary: 174 mg/dL — ABNORMAL HIGH (ref 70–99)

## 2012-09-14 LAB — TSH: TSH: 2.192 u[IU]/mL (ref 0.350–4.500)

## 2012-09-14 LAB — HEMOGLOBIN A1C: Mean Plasma Glucose: 143 mg/dL — ABNORMAL HIGH (ref <117)

## 2012-09-14 MED ORDER — BIOTENE DRY MOUTH MT LIQD
15.0000 mL | Freq: Two times a day (BID) | OROMUCOSAL | Status: DC
  Administered 2012-09-16: 15 mL via OROMUCOSAL

## 2012-09-14 MED ORDER — CHLORHEXIDINE GLUCONATE 0.12 % MT SOLN
15.0000 mL | Freq: Two times a day (BID) | OROMUCOSAL | Status: DC
  Administered 2012-09-14 – 2012-09-16 (×3): 15 mL via OROMUCOSAL
  Filled 2012-09-14 (×2): qty 15

## 2012-09-14 MED ORDER — CIPROFLOXACIN IN D5W 400 MG/200ML IV SOLN
400.0000 mg | INTRAVENOUS | Status: AC
  Filled 2012-09-14: qty 200

## 2012-09-14 MED ORDER — POTASSIUM CHLORIDE 20 MEQ/15ML (10%) PO LIQD
40.0000 meq | Freq: Once | ORAL | Status: AC
  Administered 2012-09-14: 40 meq via ORAL
  Filled 2012-09-14: qty 30

## 2012-09-14 MED ORDER — ATORVASTATIN CALCIUM 40 MG PO TABS
40.0000 mg | ORAL_TABLET | Freq: Every day | ORAL | Status: DC
  Administered 2012-09-14: 40 mg via ORAL
  Filled 2012-09-14 (×4): qty 1

## 2012-09-14 MED ORDER — ENOXAPARIN SODIUM 40 MG/0.4ML ~~LOC~~ SOLN
40.0000 mg | Freq: Once | SUBCUTANEOUS | Status: AC
  Administered 2012-09-16: 40 mg via SUBCUTANEOUS

## 2012-09-14 MED ORDER — ENOXAPARIN SODIUM 40 MG/0.4ML ~~LOC~~ SOLN
40.0000 mg | Freq: Once | SUBCUTANEOUS | Status: AC
  Administered 2012-09-14: 40 mg via SUBCUTANEOUS

## 2012-09-14 NOTE — Progress Notes (Signed)
     [facility] Daily Rounding Note 09/14/2012, 8:36 AM  SUBJECTIVE:       Feels pretty good.  Not a lot of belly pain.  No n/v.  Tolerating clears.  OBJECTIVE:         Vital signs in last 24 hours:    Temp:  [98.5 F (36.9 C)-100.5 F (38.1 C)] 98.9 F (37.2 C) (09/24 0612) Pulse Rate:  [76-84] 76  (09/24 0612) Resp:  [16-20] 16  (09/24 0612) BP: (104-139)/(36-72) 139/49 mmHg (09/24 0612) SpO2:  [94 %-100 %] 100 % (09/24 0612) Last BM Date: 09/11/12 General: looks well   Heart: RRR Chest: clear B Abdomen: soft, minor tenderness in Left mid and lower abdomen  Extremities: no edema.  Neuro/Psych:  Pleasant, not confused, relaxed.   Intake/Output from previous day: 09/23 0701 - 09/24 0700 In: 800 [I.V.:800] Out: 1475 [Urine:1475]  Intake/Output this shift: Total I/O In: 1701.7 [I.V.:1701.7] Out: -   Lab Results:  Basename 09/14/12 0515 09/13/12 0630 09/12/12 0445  WBC 7.7 7.6 7.9  HGB 10.7* 10.8* 12.4*  HCT 33.1* 32.7* 38.3*  PLT 191 186 209   BMET  Basename 09/14/12 0515 09/13/12 0630 09/12/12 0445  NA 137 137 135  K 3.4* 3.6 4.1  CL 105 105 101  CO2 22 22 21   GLUCOSE 170* 172* 245*  BUN 9 11 16   CREATININE 0.79 0.80 0.83  CALCIUM 8.4 8.3* 9.0   LFT  Basename 09/14/12 0515 09/13/12 0630 09/11/12 1933  PROT 6.4 6.1 7.4  ALBUMIN 3.0* 2.9* 3.8  AST 70* 173* 140*  ALT 146* 224* 70*  ALKPHOS 120* 115 92  BILITOT 1.9* 3.0* 1.8*  BILIDIR -- -- --  IBILI -- -- --   Studies/Results: US Abdomen Complete 09/13/2012    IMPRESSION:  Gallstones.  No sonographic evidence for cholecystitis.  No proximal CBD dilatation.  The distal duct is obscured.  Pancreas inadequately visualized.  Hepatic steatosis.   Original Report Authenticated By: Waneta Martins, M.D.     ASSESMENT: * Acute biliary pancreatitis. Clinically improved. LFT trend is declining. Imaging does not show ductal dilatation.  * Blind  * IDDM  * Remote surgery for bleeding PUD when pt was in his  25s.  * Microcytic anemia.  * Adenomatous colon polyps in 10/2010.   PLAN: *  Lap chole with IOC as LFTs declining, case d/w Dr Russella Dar.    LOS: 3 days   [name]  09/14/2012, 8:36 AM Pager: [phone]   I have taken an interval history, reviewed the chart and examined the patient. I agree with the extender's note, impression and recommendations. Since LFTs have significantly improved, and bile ducts were not dilated, I would be comfortable proceeding with lap chole and IOC. If CBD stone(s) found at [facility] will proceed with ERCP.   Venita Lick. Russella Dar MD [facility]

## 2012-09-14 NOTE — Progress Notes (Signed)
[facility] PROGRESS NOTE  [name]:[id number] DOB: 1951/08/21 DOA: 09/11/2012 PCP: Sanda Linger, MD  Assessment/Plan: Acute pancreatitis  -Likely gallstone pancreatitis -Hepatic enzymes trending down-  -Cannot rule out medication-induced, particularly furosemide  -Remain off antibiotics, afebrile, clinically stable, no leukocytosis  -Abdominal ultrasound (9/23) revealed gallstones without findings of acute cholecystitis nor common bile duct dilatation  -Urine drug screen negative  -lipids unremarkable  -Continue IV fluids, clear liquid diet as tolerated  -Consulted surgery, cholecystectomy planned September 25 -Consult gastroenterology--recommend a cholecystectomy as LFTs are improving Diabetes mellitus type 2  -Hold oral hypoglycemics at this time  -Continue NovoLog sliding scale  -hemoglobin A1c 6.6 -CBGs remarkably controlled in hospital on NovoLog sliding scale, but patient only on liquid diet -75/25--85 units a.m. and 45 units p.m. at home -May need to start long-acting insulin after surgery when able to eat -Lipid not at goal with Pravachol 40--> start Lipitor 40 -TSH 2.192  Coronary artery disease  -Continue Coreg and Imdur  Hyperlipidemia  -Restart Pravachol, WelChol, and Lovaza when tolerating diet  Hypertension  -Controlled at this time Hypokalemia -Replete potassium, check magnesium Geographic tongue -No additional intervention at this time     Disposition Plan:   Home when medically stable     Procedures/Studies: US Abdomen Complete  09/13/2012  *RADIOLOGY REPORT*  Clinical Data:  Pancreatitis, gallstones.  COMPLETE ABDOMINAL ULTRASOUND  Comparison:  09/12/2012 CT  Findings: Technically challenging examination due to patient body habitus and bowel gas artifact, resulting in limited acoustic windows.  Gallbladder:  Gallstones.  No wall thickening or pericholecystic fluid.  Negative sonographic Murphy's sign.  Common bile duct:  Normal proximal  diameter at 5 mm.  The distal duct is obscured patient body habitus/bowel gas artifact.  Liver:  Diffusely increased in echogenicity.  No focal abnormality identified.  IVC:  Appears normal.  Pancreas:  Inadequately visualized due to patient body habitus/bowel gas artifact.  Spleen:  Measures 6 cm oblique.  No focal abnormality.  Right Kidney:  Measures 11.7 cm.  No hydronephrosis or focal abnormality.  Portions of the parenchyma are incompletely visualized due to limited acoustic windows.  Left Kidney:  Measures 10.8 cm.  No hydronephrosis or focal abnormality.  Parenchymal evaluation is suboptimal due to patient body habitus and limited acoustic windows.  Abdominal aorta:  Inadequately visualized.  No aneurysmal dilatation on recent CT.  IMPRESSION:  Gallstones.  No sonographic evidence for cholecystitis.  No proximal CBD dilatation.  The distal duct is obscured.  Pancreas inadequately visualized.  Hepatic steatosis.   Original Report Authenticated By: Waneta Martins, M.D.    Ct Abdomen Pelvis W Contrast  09/12/2012  *RADIOLOGY REPORT*  Clinical Data: elevated lipase rule out pancreatitis ABDOMINAL PAIN EMESIS  CT ABDOMEN AND PELVIS WITH CONTRAST  Technique:  Multidetector CT imaging of the abdomen and pelvis was performed following the standard protocol during bolus administration of intravenous contrast.  Contrast: OMNIPAQUE IOHEXOL 300 MG/ML  SOLN  Comparison: None.  Findings: Calcifications involving the LAD, RCA and left circumflex coronary arteries noted.  No pericardial or pleural effusions.  There is mild fatty infiltration of the liver.  No suspicious liver abnormalities. Multiple tiny stones versus gallbladder sludge noted within the lumen of the gallbladder.  No gallbladder wall thickening.  No biliary dilatation.  Edema of the pancreatic parenchyma with peripancreatic fat stranding is noted consistent with pancreatitis.  No evidence for pancreatic necrosis or pseudocyst.  No fluid  collections noted within the upper abdomen. The spleen is  normal.  Both adrenal glands are normal.  Normal appearance of the right kidney.  The left kidney is normal.  The urinary bladder is within normal limits.  Prostate gland is enlarged measuring 4.4 cm in transverse dimension, image 87.  The seminal vesicles have a symmetric appearance.  No enlarged upper abdominal lymph nodes.  There is no pelvic or inguinal adenopathy.  The stomach appears normal.  The small bowel loops are unremarkable.  The appendix is noted and appears normal.  The normal appearance of the colon.  Multiple sigmoid diverticula identified without acute inflammation.  Review of the visualized osseous structures is significant for multilevel lumbar degenerative disc disease.  IMPRESSION:  1.  Acute pancreatitis.  No evidence for pancreatic necrosis or pseudocyst.  2.  Tiny gallstones versus gallbladder sludge.  3.  Fatty infiltration of the liver.   Original Report Authenticated By: Rosealee Albee, M.D.    Dg Abd Acute W/chest  09/11/2012  *RADIOLOGY REPORT*  Clinical Data: Abdominal pain  ACUTE ABDOMEN SERIES (ABDOMEN 2 VIEW & CHEST 1 VIEW)  Comparison: 03/18/2009  Findings: Mild cardiac enlargement.  No pleural effusion or edema. No airspace consolidation.  No dilated loops of small bowel or air- fluid levels noted.  There is gas and stool noted within the colon. No free intraperitoneal air identified.  IMPRESSION:  1.  No acute cardiopulmonary abnormalities.   Original Report Authenticated By: Rosealee Albee, M.D.          Subjective: Patient states that abdominal pain is gone today. Denies any chest pain, shortness breath, nausea, vomiting, diarrhea, abdominal pain. Denies any fevers, chills, dizziness, dysuria, hematuria.   Objective: Filed Vitals:   09/13/12 2130 09/14/12 0135 09/14/12 0612 09/14/12 1344  BP: 131/72 104/36 139/49 142/57  Pulse: 83 78 76 81  Temp: 100.5 F (38.1 C) 98.5 F (36.9 C) 98.9 F (37.2  C) 98.6 F (37 C)  TempSrc: Oral Oral Oral   Resp: 18 18 16 18   Height:      Weight:      SpO2: 94% 97% 100% 100%    Intake/Output Summary (Last 24 hours) at 09/14/12 1551 Last data filed at 09/14/12 1417  Gross per 24 hour  Intake 2861.67 ml  Output   2125 ml  Net 736.67 ml   Weight change:  Exam:   General:  Pt is alert, follows commands appropriately, not in acute distress  HEENT: No icterus, No thrush,  [place]/AT  Cardiovascular: RRR, S1/S2, no rubs, no gallops  Respiratory: Clear to auscultation bilaterally, no wheezing, no crackles, no rhonchi  Abdomen: Soft/+BS, non tender, non distended, no guarding  Extremities: No edema, No lymphangitis, No petechiae, No rashes, no synovitis  Data Reviewed: Basic Metabolic Panel:  Lab 09/14/12 6045 09/13/12 0630 09/12/12 0445 09/11/12 1933  NA 137 137 135 136  K 3.4* 3.6 4.1 4.0  CL 105 105 101 101  CO2 22 22 21 20   GLUCOSE 170* 172* 245* 199*  BUN 9 11 16 18   CREATININE 0.79 0.80 0.83 0.91  CALCIUM 8.4 8.3* 9.0 9.7  MG -- 1.5 -- --  PHOS -- -- -- --   Liver Function Tests:  Lab 09/14/12 0515 09/13/12 0630 09/11/12 1933  AST 70* 173* 140*  ALT 146* 224* 70*  ALKPHOS 120* 115 92  BILITOT 1.9* 3.0* 1.8*  PROT 6.4 6.1 7.4  ALBUMIN 3.0* 2.9* 3.8    Lab 09/14/12 0515 09/11/12 1933  LIPASE 166* >3000*  AMYLASE -- --  No results found for this basename: AMMONIA:5 in the last 168 hours CBC:  Lab 09/14/12 0515 09/13/12 0630 09/12/12 0445 09/11/12 1933  WBC 7.7 7.6 7.9 14.2*  NEUTROABS -- 5.8 -- 11.3*  HGB 10.7* 10.8* 12.4* 13.3  HCT 33.1* 32.7* 38.3* 39.6  MCV 70.7* 69.4* 71.5* 70.7*  PLT 191 186 209 211   Cardiac Enzymes: No results found for this basename: CKTOTAL:5,CKMB:5,CKMBINDEX:5,TROPONINI:5 in the last 168 hours BNP: No components found with this basename: POCBNP:5 CBG:  Lab 09/14/12 1343 09/14/12 1215 09/14/12 0743 09/13/12 2119 09/13/12 1726  GLUCAP 222* 167* 165* 172* 176*    No results  found for this or any previous visit (from the past 240 hour(s)).   Scheduled Meds:   . carvedilol  12.5 mg Oral BID WC  . ciprofloxacin  400 mg Intravenous On Call to OR  . dorzolamide-timolol  1 drop Right Eye BID  . enoxaparin (LOVENOX) injection  40 mg Subcutaneous Once  . enoxaparin (LOVENOX) injection  40 mg Subcutaneous Once  . insulin aspart  0-20 Units Subcutaneous TID WC  . insulin aspart  0-5 Units Subcutaneous QHS  . isosorbide mononitrate  90 mg Oral Daily  . methazolamide  50 mg Oral TID  . pantoprazole  40 mg Oral Q0600  . prednisoLONE acetate  1 drop Right Eye TID  . trimethoprim-polymyxin b  1 drop Right Eye TID  . DISCONTD: enoxaparin (LOVENOX) injection  40 mg Subcutaneous Daily   Continuous Infusions:   . sodium chloride 100 mL/hr at 09/14/12 1417     Mateja Dier, DO  [facility] Pager [phone]  If 7PM-7AM, please contact night-coverage [email or web address] Password [email or web address] 09/14/2012, 3:51 PM   LOS: 3 days

## 2012-09-14 NOTE — Progress Notes (Signed)
Dr. Jairo Ben discussed with me the need for a cardiology consult before surgery tomorrow.  Will request cardiology consult

## 2012-09-14 NOTE — Consult Note (Signed)
Discussed with Dr. Daphine Deutscher.  Patient with 3 vessel ASCADz, s/p stent 2008. He will have cardiology see patient pre-operatively.   Sandford Craze, MD

## 2012-09-14 NOTE — Progress Notes (Signed)
Patient ID: [name], male   DOB: [birth year], 61 y.o.   MRN: [id number]    Subjective: Pt feels ok.  Had some belly pain this morning.  Eating clear liquids.  Objective: Vital signs in last 24 hours: Temp:  [98.5 F (36.9 C)-100.5 F (38.1 C)] 98.9 F (37.2 C) (09/24 0612) Pulse Rate:  [76-84] 76  (09/24 0612) Resp:  [16-20] 16  (09/24 0612) BP: (104-139)/(36-72) 139/49 mmHg (09/24 0612) SpO2:  [94 %-100 %] 100 % (09/24 0612) Last BM Date: 09/11/12  Intake/Output from previous day: 09/23 0701 - 09/24 0700 In: 800 [I.V.:800] Out: 1475 [Urine:1475] Intake/Output this shift: Total I/O In: 1701.7 [I.V.:1701.7] Out: -   PE: Abd: soft, mild epigastric tenderness, +BS, ND, obese  Lab Results:   Basename 09/14/12 0515 09/13/12 0630  WBC 7.7 7.6  HGB 10.7* 10.8*  HCT 33.1* 32.7*  PLT 191 186   BMET  Basename 09/14/12 0515 09/13/12 0630  NA 137 137  K 3.4* 3.6  CL 105 105  CO2 22 22  GLUCOSE 170* 172*  BUN 9 11  CREATININE 0.79 0.80  CALCIUM 8.4 8.3*   PT/INR No results found for this basename: LABPROT:2,INR:2 in the last 72 hours CMP     Component Value Date/Time   NA 137 09/14/2012 0515   K 3.4* 09/14/2012 0515   CL 105 09/14/2012 0515   CO2 22 09/14/2012 0515   GLUCOSE 170* 09/14/2012 0515   BUN 9 09/14/2012 0515   CREATININE 0.79 09/14/2012 0515   CALCIUM 8.4 09/14/2012 0515   PROT 6.4 09/14/2012 0515   ALBUMIN 3.0* 09/14/2012 0515   AST 70* 09/14/2012 0515   ALT 146* 09/14/2012 0515   ALKPHOS 120* 09/14/2012 0515   BILITOT 1.9* 09/14/2012 0515   GFRNONAA >90 09/14/2012 0515   GFRAA >90 09/14/2012 0515   Lipase     Component Value Date/Time   LIPASE 166* 09/14/2012 0515       Studies/Results: US Abdomen Complete  09/13/2012  *RADIOLOGY REPORT*  Clinical Data:  Pancreatitis, gallstones.  COMPLETE ABDOMINAL ULTRASOUND  Comparison:  09/12/2012 CT  Findings: Technically challenging examination due to patient body habitus and bowel gas artifact, resulting in  limited acoustic windows.  Gallbladder:  Gallstones.  No wall thickening or pericholecystic fluid.  Negative sonographic Murphy's sign.  Common bile duct:  Normal proximal diameter at 5 mm.  The distal duct is obscured patient body habitus/bowel gas artifact.  Liver:  Diffusely increased in echogenicity.  No focal abnormality identified.  IVC:  Appears normal.  Pancreas:  Inadequately visualized due to patient body habitus/bowel gas artifact.  Spleen:  Measures 6 cm oblique.  No focal abnormality.  Right Kidney:  Measures 11.7 cm.  No hydronephrosis or focal abnormality.  Portions of the parenchyma are incompletely visualized due to limited acoustic windows.  Left Kidney:  Measures 10.8 cm.  No hydronephrosis or focal abnormality.  Parenchymal evaluation is suboptimal due to patient body habitus and limited acoustic windows.  Abdominal aorta:  Inadequately visualized.  No aneurysmal dilatation on recent CT.  IMPRESSION:  Gallstones.  No sonographic evidence for cholecystitis.  No proximal CBD dilatation.  The distal duct is obscured.  Pancreas inadequately visualized.  Hepatic steatosis.   Original Report Authenticated By: Waneta Martins, M.D.     Anti-infectives: Anti-infectives     Start     Dose/Rate Route Frequency Ordered Stop   09/15/12 0000   ciprofloxacin (CIPRO) IVPB 400 mg     Comments: On call  to OR      400 mg 200 mL/hr over 60 Minutes Intravenous  Once 09/14/12 0927             Assessment/Plan  1. Gallstone pancreatitis  Plan: 1. The patient seems to be improving.  He has eaten clear liquids today, therefore, unable to go to OR.  His lipase is 165 and his TB has trended down.  I will make the patient NPO after MN and pre-op him in case we can proceed with surgical intervention tomorrow.   LOS: 3 days    Trimaine Maser E 09/14/2012, 9:27 AM Pager: [phone]

## 2012-09-14 NOTE — Anesthesia Preprocedure Evaluation (Addendum)
Anesthesia Evaluation  Patient identified by MRN, date of birth, ID band Patient awake    Reviewed: Allergy & Precautions, H&P , NPO status , Patient's Chart, lab work & pertinent test results  History of Anesthesia Complications Negative for: history of anesthetic complications  Airway Mallampati: I TM Distance: >3 FB Neck ROM: Full    Dental  (+) Edentulous Upper, Edentulous Lower and Dental Advisory Given   Pulmonary shortness of breath and with exertion,          Cardiovascular hypertension, Pt. on medications and Pt. on home beta blockers + CAD, + Cardiac Stents and +CHF     Neuro/Psych    GI/Hepatic Neg liver ROS, GERD-  Medicated and Controlled,  Endo/Other  diabetes, Well Controlled, Type 2, Insulin Dependent and Oral Hypoglycemic Agents  Renal/GU negative Renal ROS     Musculoskeletal negative musculoskeletal ROS (+)   Abdominal   Peds  Hematology negative hematology ROS (+)   Anesthesia Other Findings   Reproductive/Obstetrics                        Anesthesia Physical Anesthesia Plan  ASA: III  Anesthesia Plan: General   Post-op Pain Management:    Induction: Intravenous  Airway Management Planned: Oral ETT  Additional Equipment:   Intra-op Plan:   Post-operative Plan: Extubation in OR  Informed Consent: I have reviewed the patients History and Physical, chart, labs and discussed the procedure including the risks, benefits and alternatives for the proposed anesthesia with the patient or authorized representative who has indicated his/her understanding and acceptance.     Plan Discussed with: CRNA and Surgeon  Anesthesia Plan Comments:         Anesthesia Quick Evaluation

## 2012-09-14 NOTE — Consult Note (Signed)
Admit date: 09/11/2012 Referring Physician Dr. Arbutus Leas Primary Physician Dr. Sanda Linger Primary Cardiologist  Dr. Donato Schultz Reason for Consultation  Preoperative clearance for surgery  HPI:  [name] is a 61 y.o. male with multiple medical problems, including CAD, who presents with complaints of sudden epigastric pain in the afternoon, that was described as constant sharp and stabbing, 10/10 pain, associated with nausea and vomiting. He reports not being able to hold down any foods or liquids. He was evaluated in the ED and found to have to a lipase level of 3000, and a ct scan of the ABD was performed which revealed acute pancreatitis. He denied having any previous bouts of pancreatitis in the past. He was also noted to have small gallstones vs. gallbladder sludge c/w gallstone pancreatitis.  We are asked to give preoperative clearance for laparoscopic cholecystectomy.  He denies any chest pain but is not very active.  He says that he will get CP if he forgets to take his meds and exerts himself.  He denies any SOB.      PMH:   Past Medical History  Diagnosis Date  . Glaucoma   . Ulcer   . HTN (hypertension)   . Hypercholesterolemia   . Diabetes mellitus     Type II  . CHF (congestive heart failure) diastolic with EF 40% at cath 2008   . CAD s/p PCIwith BMS to proximal left circ 2008, cath 2008 with 30% prox LAD, 80% distal LAD, 95% prox left circ s/p PCI, 70% second subbranch of OM1, 60% OM2, 95% proximal to mid RCA with diffuse elsewhere   . LBP (low back pain)   . Legally blind      PSH:   Past Surgical History  Procedure Date  . Prosthetic cornea placement, right eye 2007    [facility]  . Lumbar fusion   . Peptic ulcer dz surgery pt was in his 20s    bleeding ulcer.   . Inguinal hernia repair     right    Allergies:  Diamox and Lisinopril Prior to Admit Meds:   Prescriptions prior to admission  Medication Sig Dispense Refill  . carvedilol (COREG) 12.5 MG tablet  Take 12.5 mg by mouth 2 (two) times daily with a meal.        . Colesevelam HCl 3.75 G PACK Take 1 each by mouth daily.      . dorzolamide-timolol (COSOPT) 22.3-6.8 MG/ML ophthalmic solution Place 1 drop into the right eye 2 (two) times daily.        . furosemide (LASIX) 40 MG tablet Take 40 mg by mouth daily.       . insulin lispro protamine-insulin lispro (HUMALOG 75/25) (75-25) 100 UNIT/ML SUSP Inject 45-85 Units into the skin 2 (two) times daily with a meal. Takes 85 units in the am & 45 units in the pm      . isosorbide mononitrate (IMDUR) 60 MG 24 hr tablet Take 90 mg by mouth daily.       . metFORMIN (GLUCOPHAGE) 1000 MG tablet Take 1,000 mg by mouth 2 (two) times daily with a meal.      . methazolamide (NEPTAZANE) 50 MG tablet Take 50 mg by mouth 3 (three) times daily.        Marland Kitchen omega-3 acid ethyl esters (LOVAZA) 1 G capsule Take 1 g by mouth 2 (two) times daily.      . pravastatin (PRAVACHOL) 40 MG tablet Take 40 mg by mouth daily.      Marland Kitchen  prednisoLONE acetate (PRED FORTE) 1 % ophthalmic suspension Place 1 drop into the right eye 3 (three) times daily.      . ranitidine (ZANTAC) 150 MG tablet Take 150 mg by mouth 2 (two) times daily.      . sitaGLIPtin (JANUVIA) 100 MG tablet Take 100 mg by mouth daily.      Marland Kitchen telmisartan (MICARDIS) 40 MG tablet Take 40 mg by mouth daily.       Fam HX:    Family History  Problem Relation Age of Onset  . Breast cancer Mother   . Colon cancer Mother   . Colon cancer Other     Elevated Risk for   Social HX:    History   Social History  . Marital Status: Married    Spouse Name: N/A    Number of Children: N/A  . Years of Education: N/A   Occupational History  . disabled     blind   Social History Main Topics  . Smoking status: Never Smoker   . Smokeless tobacco: Not on file  . Alcohol Use: No  . Drug Use: Not on file  . Sexually Active: Not Currently   Other Topics Concern  . Not on file   Social History Narrative   Occupation:  disabled, blindMarriedRegular Exercise-no     ROS:  All 11 ROS were addressed and are negative except what is stated in the HPI  Physical Exam:     General: Well developed, well nourished, in no acute distress Head: Eyes PERRLA, No xanthomas.   Normal cephalic and atramatic  Lungs:   Clear bilaterally to auscultation and percussion. Heart:   HRRR S1 S2 Pulses are 2+ & equal.            No carotid bruit. No JVD.  No abdominal bruits. No femoral bruits. Abdomen: Bowel sounds are positive, abdomen soft and non-tender without masses Extremities:   No clubbing, cyanosis or edema.  DP +1 Neuro: Alert and oriented X 3. Psych:  Good affect, responds appropriately    Labs:   Lab Results  Component Value Date   WBC 7.7 09/14/2012   HGB 10.7* 09/14/2012   HCT 33.1* 09/14/2012   MCV 70.7* 09/14/2012   PLT 191 09/14/2012    Lab 09/14/12 0515  NA 137  K 3.4*  CL 105  CO2 22  BUN 9  CREATININE 0.79  CALCIUM 8.4  PROT 6.4  BILITOT 1.9*  ALKPHOS 120*  ALT 146*  AST 70*  GLUCOSE 170*   No results found for this basename: PTT   Lab Results  Component Value Date   INR 0.95 08/17/2010   INR 1.0 05/25/2009   INR 0.9 11/17/2007   Lab Results  Component Value Date   CKTOTAL 164 05/26/2011   CKMB 1.8 08/17/2010   TROPONINI  Value: 0.01        NO INDICATION OF MYOCARDIAL INJURY. 08/17/2010     Lab Results  Component Value Date   CHOL 153 09/13/2012   CHOL 207* 03/31/2012   CHOL 189 05/26/2011   Lab Results  Component Value Date   HDL 25* 09/13/2012   HDL 28.50* 03/31/2012   HDL 29.90* 05/26/2011   Lab Results  Component Value Date   LDLCALC 108* 09/13/2012   LDLCALC 126* 05/26/2011   LDLCALC  Value: 100        Total Cholesterol/HDL:CHD Risk Coronary Heart Disease Risk Table  Men   Women  1/2 Average Risk   3.4   3.3  Average Risk       5.0   4.4  2 X Average Risk   9.6   7.1  3 X Average Risk  23.4   11.0        Use the calculated Patient Ratio above and the CHD Risk  Table to determine the patient's CHD Risk.        ATP III CLASSIFICATION (LDL):  <100     mg/dL   Optimal  528-413  mg/dL   Near or Above                    Optimal  130-159  mg/dL   Borderline  244-010  mg/dL   High  >272     mg/dL   Very High* 04/24/6643   Lab Results  Component Value Date   TRIG 99 09/13/2012   TRIG 126.0 03/31/2012   TRIG 165.0* 05/26/2011   Lab Results  Component Value Date   CHOLHDL 6.1 09/13/2012   CHOLHDL 7 03/31/2012   CHOLHDL 6 05/26/2011   Lab Results  Component Value Date   LDLDIRECT 150.8 03/31/2012   LDLDIRECT 128.2 08/28/2010      Radiology:  US Abdomen Complete  09/13/2012  *RADIOLOGY REPORT*  Clinical Data:  Pancreatitis, gallstones.  COMPLETE ABDOMINAL ULTRASOUND  Comparison:  09/12/2012 CT  Findings: Technically challenging examination due to patient body habitus and bowel gas artifact, resulting in limited acoustic windows.  Gallbladder:  Gallstones.  No wall thickening or pericholecystic fluid.  Negative sonographic Murphy's sign.  Common bile duct:  Normal proximal diameter at 5 mm.  The distal duct is obscured patient body habitus/bowel gas artifact.  Liver:  Diffusely increased in echogenicity.  No focal abnormality identified.  IVC:  Appears normal.  Pancreas:  Inadequately visualized due to patient body habitus/bowel gas artifact.  Spleen:  Measures 6 cm oblique.  No focal abnormality.  Right Kidney:  Measures 11.7 cm.  No hydronephrosis or focal abnormality.  Portions of the parenchyma are incompletely visualized due to limited acoustic windows.  Left Kidney:  Measures 10.8 cm.  No hydronephrosis or focal abnormality.  Parenchymal evaluation is suboptimal due to patient body habitus and limited acoustic windows.  Abdominal aorta:  Inadequately visualized.  No aneurysmal dilatation on recent CT.  IMPRESSION:  Gallstones.  No sonographic evidence for cholecystitis.  No proximal CBD dilatation.  The distal duct is obscured.  Pancreas inadequately visualized.  Hepatic  steatosis.   Original Report Authenticated By: Waneta Martins, M.D.     EKG:  NSR with old inferior infarct  ASSESSMENT:  1.  Multivessel ASCAD with moderate to severe disease in the distal LAD, high grade disease in the RCA with a shepherd's crook turn and moderate disease in the left circumflex system.   Given his significant CAD he is at moderate to significant risk of coronary ischemia during surgery.  Would recommend the use of introperative IV NTG to maximize coronary perfusion. Would try to maximize antianginal therapy pre-op.  Cardiac risks for surgery discussed at length with the patient and his wife. They understand the risk of coronary ischemia and MI associated with proceeding with surgery.  Quintella Reichert, MD  09/14/2012  3:46 PM

## 2012-09-15 ENCOUNTER — Encounter (HOSPITAL_COMMUNITY): Payer: Self-pay | Admitting: Anesthesiology

## 2012-09-15 ENCOUNTER — Encounter (HOSPITAL_COMMUNITY)
Admission: EM | Disposition: A | Discharge status: Home / Self Care | Payer: Self-pay | Source: Home / Self Care | Attending: Internal Medicine

## 2012-09-15 ENCOUNTER — Inpatient Hospital Stay (HOSPITAL_COMMUNITY): Payer: Medicare Other

## 2012-09-15 ENCOUNTER — Inpatient Hospital Stay (HOSPITAL_COMMUNITY): Payer: Medicare Other | Admitting: Anesthesiology

## 2012-09-15 DIAGNOSIS — N400 Enlarged prostate without lower urinary tract symptoms: Secondary | ICD-10-CM

## 2012-09-15 DIAGNOSIS — R0989 Other specified symptoms and signs involving the circulatory and respiratory systems: Secondary | ICD-10-CM

## 2012-09-15 DIAGNOSIS — R0609 Other forms of dyspnea: Secondary | ICD-10-CM

## 2012-09-15 HISTORY — PX: CHOLECYSTECTOMY: SHX55

## 2012-09-15 LAB — GLUCOSE, CAPILLARY
Glucose-Capillary: 145 mg/dL — ABNORMAL HIGH (ref 70–99)
Glucose-Capillary: 116 mg/dL — ABNORMAL HIGH (ref 70–99)

## 2012-09-15 LAB — COMPREHENSIVE METABOLIC PANEL
AST: 48 U/L — ABNORMAL HIGH (ref 0–37)
Albumin: 2.7 g/dL — ABNORMAL LOW (ref 3.5–5.2)
Alkaline Phosphatase: 107 U/L (ref 39–117)
BUN: 7 mg/dL (ref 6–23)
Potassium: 3.7 mEq/L (ref 3.5–5.1)
Sodium: 139 mEq/L (ref 135–145)
Total Protein: 6.3 g/dL (ref 6.0–8.3)

## 2012-09-15 LAB — MAGNESIUM: Magnesium: 1.9 mg/dL (ref 1.5–2.5)

## 2012-09-15 LAB — SURGICAL PCR SCREEN: MRSA, PCR: NEGATIVE

## 2012-09-15 SURGERY — LAPAROSCOPIC CHOLECYSTECTOMY WITH INTRAOPERATIVE CHOLANGIOGRAM
Anesthesia: General | Site: Abdomen | Wound class: Contaminated

## 2012-09-15 MED ORDER — NEOSTIGMINE METHYLSULFATE 1 MG/ML IJ SOLN
INTRAMUSCULAR | Status: DC | PRN
  Administered 2012-09-15: 4 mg via INTRAVENOUS

## 2012-09-15 MED ORDER — LIDOCAINE HCL (CARDIAC) 20 MG/ML IV SOLN
INTRAVENOUS | Status: DC | PRN
  Administered 2012-09-15: 100 mg via INTRAVENOUS

## 2012-09-15 MED ORDER — SODIUM CHLORIDE 0.9 % IR SOLN
Status: DC | PRN
  Administered 2012-09-15: 2000 mL

## 2012-09-15 MED ORDER — ONDANSETRON HCL 4 MG/2ML IJ SOLN
INTRAMUSCULAR | Status: DC | PRN
  Administered 2012-09-15: 4 mg via INTRAVENOUS

## 2012-09-15 MED ORDER — GLYCOPYRROLATE 0.2 MG/ML IJ SOLN
INTRAMUSCULAR | Status: DC | PRN
  Administered 2012-09-15: .6 mg via INTRAVENOUS

## 2012-09-15 MED ORDER — CEFAZOLIN SODIUM 1-5 GM-% IV SOLN
INTRAVENOUS | Status: DC | PRN
  Administered 2012-09-15: 2 g via INTRAVENOUS

## 2012-09-15 MED ORDER — CEFAZOLIN SODIUM-DEXTROSE 2-3 GM-% IV SOLR
INTRAVENOUS | Status: AC
  Filled 2012-09-15: qty 50

## 2012-09-15 MED ORDER — HYDROMORPHONE HCL PF 1 MG/ML IJ SOLN
INTRAMUSCULAR | Status: AC
  Filled 2012-09-15: qty 1

## 2012-09-15 MED ORDER — OXYCODONE HCL 5 MG PO TABS: 5.0000 mg | ORAL_TABLET | Freq: Once | ORAL | Status: DC | PRN

## 2012-09-15 MED ORDER — FENTANYL CITRATE 0.05 MG/ML IJ SOLN
INTRAMUSCULAR | Status: DC | PRN
  Administered 2012-09-15: 50 ug via INTRAVENOUS
  Administered 2012-09-15: 150 ug via INTRAVENOUS
  Administered 2012-09-15 (×3): 50 ug via INTRAVENOUS

## 2012-09-15 MED ORDER — MIDAZOLAM HCL 5 MG/5ML IJ SOLN
INTRAMUSCULAR | Status: DC | PRN
  Administered 2012-09-15: 2 mg via INTRAVENOUS

## 2012-09-15 MED ORDER — MEPERIDINE HCL 25 MG/ML IJ SOLN: 6.2500 mg | INTRAMUSCULAR | Status: DC | PRN

## 2012-09-15 MED ORDER — INSULIN ASPART PROT & ASPART (70-30 MIX) 100 UNIT/ML ~~LOC~~ SUSP
85.0000 [IU] | Freq: Every day | SUBCUTANEOUS | Status: DC
  Filled 2012-09-15: qty 3

## 2012-09-15 MED ORDER — SODIUM CHLORIDE 0.9 % IV SOLN
INTRAVENOUS | Status: DC | PRN
  Administered 2012-09-15: 17:00:00

## 2012-09-15 MED ORDER — BUPIVACAINE HCL (PF) 0.25 % IJ SOLN
INTRAMUSCULAR | Status: DC | PRN
  Administered 2012-09-15: 10 mL

## 2012-09-15 MED ORDER — PHENYLEPHRINE HCL 10 MG/ML IJ SOLN
INTRAMUSCULAR | Status: DC | PRN
  Administered 2012-09-15: 40 ug via INTRAVENOUS
  Administered 2012-09-15 (×2): 80 ug via INTRAVENOUS

## 2012-09-15 MED ORDER — METHAZOLAMIDE 50 MG PO TABS
50.0000 mg | ORAL_TABLET | Freq: Three times a day (TID) | ORAL | Status: DC
  Administered 2012-09-15: 50 mg via ORAL
  Filled 2012-09-15 (×4): qty 1

## 2012-09-15 MED ORDER — BUPIVACAINE HCL (PF) 0.25 % IJ SOLN
INTRAMUSCULAR | Status: AC
  Filled 2012-09-15: qty 30

## 2012-09-15 MED ORDER — HYDROMORPHONE HCL PF 1 MG/ML IJ SOLN
0.2500 mg | INTRAMUSCULAR | Status: DC | PRN
  Administered 2012-09-15 (×2): 0.5 mg via INTRAVENOUS

## 2012-09-15 MED ORDER — PROPOFOL 10 MG/ML IV BOLUS
INTRAVENOUS | Status: DC | PRN
Start: 2012-09-15 — End: 2012-09-15
  Administered 2012-09-15: 120 mg via INTRAVENOUS

## 2012-09-15 MED ORDER — LACTATED RINGERS IV SOLN
INTRAVENOUS | Status: DC | PRN
  Administered 2012-09-15 (×2): via INTRAVENOUS

## 2012-09-15 MED ORDER — INSULIN ASPART PROT & ASPART (70-30 MIX) 100 UNIT/ML ~~LOC~~ SUSP: 45.0000 [IU] | Freq: Every day | SUBCUTANEOUS | Status: DC

## 2012-09-15 MED ORDER — INSULIN LISPRO PROT & LISPRO (75-25 MIX) 100 UNIT/ML ~~LOC~~ SUSP: 45.0000 [IU] | Freq: Two times a day (BID) | SUBCUTANEOUS | Status: DC

## 2012-09-15 MED ORDER — OXYCODONE HCL 5 MG/5ML PO SOLN: 5.0000 mg | Freq: Once | ORAL | Status: DC | PRN

## 2012-09-15 MED ORDER — ROCURONIUM BROMIDE 100 MG/10ML IV SOLN
INTRAVENOUS | Status: DC | PRN
  Administered 2012-09-15: 10 mg via INTRAVENOUS
  Administered 2012-09-15: 50 mg via INTRAVENOUS

## 2012-09-15 MED ORDER — ONDANSETRON HCL 4 MG/2ML IJ SOLN: 4.0000 mg | Freq: Once | INTRAMUSCULAR | Status: DC | PRN

## 2012-09-15 SURGICAL SUPPLY — 44 items
APPLIER CLIP ROT 10 11.4 M/L (STAPLE) ×2
BENZOIN TINCTURE PRP APPL 2/3 (GAUZE/BANDAGES/DRESSINGS) ×2 IMPLANT
BLADE SURG ROTATE 9660 (MISCELLANEOUS) ×2 IMPLANT
CANISTER SUCTION 2500CC (MISCELLANEOUS) ×2 IMPLANT
CATH REDDICK CHOLANGI 4FR 50CM (CATHETERS) ×2 IMPLANT
CLIP APPLIE ROT 10 11.4 M/L (STAPLE) ×1 IMPLANT
CLOTH BEACON ORANGE TIMEOUT ST (SAFETY) ×2 IMPLANT
COVER MAYO STAND STRL (DRAPES) ×2 IMPLANT
COVER SURGICAL LIGHT HANDLE (MISCELLANEOUS) ×2 IMPLANT
DECANTER SPIKE VIAL GLASS SM (MISCELLANEOUS) IMPLANT
DERMABOND ADVANCED (GAUZE/BANDAGES/DRESSINGS) ×1
DERMABOND ADVANCED .7 DNX12 (GAUZE/BANDAGES/DRESSINGS) ×1 IMPLANT
DRAPE C-ARM 42X72 X-RAY (DRAPES) ×2 IMPLANT
ELECT REM PT RETURN 9FT ADLT (ELECTROSURGICAL) ×2
ELECTRODE REM PT RTRN 9FT ADLT (ELECTROSURGICAL) ×1 IMPLANT
GAUZE SPONGE 2X2 8PLY STRL LF (GAUZE/BANDAGES/DRESSINGS) ×1 IMPLANT
GLOVE BIO SURGEON STRL SZ 6.5 (GLOVE) ×2 IMPLANT
GLOVE BIO SURGEON STRL SZ7 (GLOVE) ×2 IMPLANT
GLOVE BIO SURGEON STRL SZ8 (GLOVE) ×2 IMPLANT
GLOVE ECLIPSE 7.0 STRL STRAW (GLOVE) ×2 IMPLANT
GOWN PREVENTION PLUS XXLARGE (GOWN DISPOSABLE) ×2 IMPLANT
GOWN STRL NON-REIN LRG LVL3 (GOWN DISPOSABLE) ×4 IMPLANT
HEMOSTAT SURGICEL 2X14 (HEMOSTASIS) IMPLANT
IV CATH 14GX2 1/4 (CATHETERS) ×2 IMPLANT
KIT BASIN OR (CUSTOM PROCEDURE TRAY) ×2 IMPLANT
KIT ROOM TURNOVER OR (KITS) ×2 IMPLANT
NS IRRIG 1000ML POUR BTL (IV SOLUTION) ×2 IMPLANT
PAD ARMBOARD 7.5X6 YLW CONV (MISCELLANEOUS) ×4 IMPLANT
POUCH SPECIMEN RETRIEVAL 10MM (ENDOMECHANICALS) ×2 IMPLANT
SCISSORS LAP 5X35 DISP (ENDOMECHANICALS) ×2 IMPLANT
SET IRRIG TUBING LAPAROSCOPIC (IRRIGATION / IRRIGATOR) ×2 IMPLANT
SLEEVE ENDOPATH XCEL 5M (ENDOMECHANICALS) ×4 IMPLANT
SPECIMEN JAR SMALL (MISCELLANEOUS) ×2 IMPLANT
SPONGE GAUZE 2X2 STER 10/PKG (GAUZE/BANDAGES/DRESSINGS) ×1
SUT VIC AB 4-0 SH 18 (SUTURE) ×2 IMPLANT
SUT VICRYL 0 UR6 27IN ABS (SUTURE) IMPLANT
TIP RIGID 35CM EVICEL (HEMOSTASIS) IMPLANT
TOWEL OR 17X24 6PK STRL BLUE (TOWEL DISPOSABLE) ×2 IMPLANT
TOWEL OR 17X26 10 PK STRL BLUE (TOWEL DISPOSABLE) ×2 IMPLANT
TRAY LAPAROSCOPIC (CUSTOM PROCEDURE TRAY) ×2 IMPLANT
TROCAR XCEL BLUNT TIP 100MML (ENDOMECHANICALS) ×2 IMPLANT
TROCAR XCEL NON-BLD 11X100MML (ENDOMECHANICALS) ×2 IMPLANT
TROCAR XCEL NON-BLD 5MMX100MML (ENDOMECHANICALS) IMPLANT
WATER STERILE IRR 1000ML POUR (IV SOLUTION) IMPLANT

## 2012-09-15 NOTE — Op Note (Signed)
[name] @date @  Procedure: Laparoscopic Cholecystectomy with intraoperative cholangiogram  Surgeon: Wenda Low, MD, FACS Asst:  none  Anes:  General  Drains: None  Findings: Chronically walled off GB from prior gastric surgery;  Chronic cholecystitis with anthracotic pigment stones  Description of Procedure: The patient was taken to OR 1 and given general anesthesia.  The patient was prepped with PCMX and draped sterilely. A time out was performed.  Access to the abdomen was achieved with a 5 mm Optiview technique.  Port placement included 3 five mm trocars and 11 in upper midline.    The gallbladder was visualized and the fundus was grasped and the gallbladder was elevated and in so doing the GB spilled black bile which was removed with suction irrigation. Traction on the infundibulum allowed for successful demonstration of the critical view. Inflammatory changes were marked and taken down from the duodenum with scissors.  The cystic duct was identified and clipped up on the gallbladder and an incision was made in the cystic duct and the Reddick catheter was inserted after milking the cystic duct of any debris. A dynamic cholangiogram was performed which demonstrated good intrahepatic filling and free flow into the duodenum although this was obscured by the contrast in the transverse colon.    The cystic duct was then triple clipped and divided, the cystic artery was double clipped and divided and then the gallbladder was removed from the gallbladder bed. Removal of the gallbladder from the gallbladder bed was performed with the hook cauter.  The gallbladder was then placed in a bag and brought out through one of the 10 mm trocar sites. The gallbladder bed was inspected and no bleeding or bile leaks were seen.   Laparoscopic visualization was used when closing fascial defects for trocar sites.   Incisions were injected with marcaine and closed with 4-0 Vicryl and Dermbond on the skin.   Sponge and needle count were correct.    The patient was taken to the recovery room in satisfactory condition.

## 2012-09-15 NOTE — Progress Notes (Signed)
[facility] PROGRESS NOTE  [name] ZOX:[id number] DOB: Jan 02, 1951 DOA: 09/11/2012 PCP: Sanda Linger, MD  Assessment/Plan: Acute pancreatitis, Likely gallstone pancreatitis, although possible that lasix contributed also. Abdominal ultrasound (9/23) revealed gallstones without findings of acute cholecystitis nor common bile duct dilatation, but LFTs were elevated. Urine drug screen was negative and lipids were unremarkable.  Hepatic enzymes continued to trend down today.  To OR today for cholecystectomy.  Off antibiotics, afebrile, clinically stable, no leukocytosis.   - Continue IV fluids, and advance diet post-operatively per surgery - Pain control per surgery overnight  Diabetes mellitus type 2 , fair control -Hold oral hypoglycemics at this time  -Continue NovoLog sliding scale  -hemoglobin A1c 6.6 -CBGs remarkably controlled in hospital on NovoLog sliding scale, but patient only on liquid diet -75/25--85 units a.m. and 45 units p.m. at home -May need to start long-acting insulin after surgery when able to eat, but will trend for now. -Lipid not at goal with Pravachol 40--> start Lipitor 40 -TSH 2.192   Coronary artery disease, stable -Continue Coreg and Imdur   Hyperlipidemia  -Restart Pravachol, WelChol, and Lovaza when tolerating diet   Hypertension  -Controlled at this time  Hypokalemia, resolved  DIET: NPO ACCESS:  PIV IVF:  NS at 100/h PROPH:  SCDs  Disposition Plan:   Home when medically stable    Procedures/Studies: US Abdomen Complete  09/13/2012  *RADIOLOGY REPORT*  Clinical Data:  Pancreatitis, gallstones.  COMPLETE ABDOMINAL ULTRASOUND  Comparison:  09/12/2012 CT  Findings: Technically challenging examination due to patient body habitus and bowel gas artifact, resulting in limited acoustic windows.  Gallbladder:  Gallstones.  No wall thickening or pericholecystic fluid.  Negative sonographic Murphy's sign.  Common bile duct:  Normal proximal diameter  at 5 mm.  The distal duct is obscured patient body habitus/bowel gas artifact.  Liver:  Diffusely increased in echogenicity.  No focal abnormality identified.  IVC:  Appears normal.  Pancreas:  Inadequately visualized due to patient body habitus/bowel gas artifact.  Spleen:  Measures 6 cm oblique.  No focal abnormality.  Right Kidney:  Measures 11.7 cm.  No hydronephrosis or focal abnormality.  Portions of the parenchyma are incompletely visualized due to limited acoustic windows.  Left Kidney:  Measures 10.8 cm.  No hydronephrosis or focal abnormality.  Parenchymal evaluation is suboptimal due to patient body habitus and limited acoustic windows.  Abdominal aorta:  Inadequately visualized.  No aneurysmal dilatation on recent CT.  IMPRESSION:  Gallstones.  No sonographic evidence for cholecystitis.  No proximal CBD dilatation.  The distal duct is obscured.  Pancreas inadequately visualized.  Hepatic steatosis.   Original Report Authenticated By: Waneta Martins, M.D.    Ct Abdomen Pelvis W Contrast  09/12/2012  *RADIOLOGY REPORT*  Clinical Data: elevated lipase rule out pancreatitis ABDOMINAL PAIN EMESIS  CT ABDOMEN AND PELVIS WITH CONTRAST  Technique:  Multidetector CT imaging of the abdomen and pelvis was performed following the standard protocol during bolus administration of intravenous contrast.  Contrast: OMNIPAQUE IOHEXOL 300 MG/ML  SOLN  Comparison: None.  Findings: Calcifications involving the LAD, RCA and left circumflex coronary arteries noted.  No pericardial or pleural effusions.  There is mild fatty infiltration of the liver.  No suspicious liver abnormalities. Multiple tiny stones versus gallbladder sludge noted within the lumen of the gallbladder.  No gallbladder wall thickening.  No biliary dilatation.  Edema of the pancreatic parenchyma with peripancreatic fat stranding is noted consistent with pancreatitis.  No evidence for pancreatic  necrosis or pseudocyst.  No fluid collections noted  within the upper abdomen. The spleen is normal.  Both adrenal glands are normal.  Normal appearance of the right kidney.  The left kidney is normal.  The urinary bladder is within normal limits.  Prostate gland is enlarged measuring 4.4 cm in transverse dimension, image 87.  The seminal vesicles have a symmetric appearance.  No enlarged upper abdominal lymph nodes.  There is no pelvic or inguinal adenopathy.  The stomach appears normal.  The small bowel loops are unremarkable.  The appendix is noted and appears normal.  The normal appearance of the colon.  Multiple sigmoid diverticula identified without acute inflammation.  Review of the visualized osseous structures is significant for multilevel lumbar degenerative disc disease.  IMPRESSION:  1.  Acute pancreatitis.  No evidence for pancreatic necrosis or pseudocyst.  2.  Tiny gallstones versus gallbladder sludge.  3.  Fatty infiltration of the liver.   Original Report Authenticated By: Rosealee Albee, M.D.    Dg Abd Acute W/chest  09/11/2012  *RADIOLOGY REPORT*  Clinical Data: Abdominal pain  ACUTE ABDOMEN SERIES (ABDOMEN 2 VIEW & CHEST 1 VIEW)  Comparison: 03/18/2009  Findings: Mild cardiac enlargement.  No pleural effusion or edema. No airspace consolidation.  No dilated loops of small bowel or air- fluid levels noted.  There is gas and stool noted within the colon. No free intraperitoneal air identified.  IMPRESSION:  1.  No acute cardiopulmonary abnormalities.   Original Report Authenticated By: Rosealee Albee, M.D.          Subjective: Patient chest pain, shortness breath, nausea, vomiting, diarrhea.  Has some post-operative abdominal pain.   Objective: Filed Vitals:   09/15/12 0045 09/15/12 0154 09/15/12 0528 09/15/12 1232  BP:   145/91 143/76  Pulse:   68 71  Temp: 100.9 F (38.3 C)  97.8 F (36.6 C) 97.8 F (36.6 C)  TempSrc:      Resp:   18 18  Height:      Weight:      SpO2:  96% 98% 100%    Intake/Output Summary (Last  24 hours) at 09/15/12 1333 Last data filed at 09/15/12 0803  Gross per 24 hour  Intake 2711.67 ml  Output   1500 ml  Net 1211.67 ml   Weight change:  Exam:   General:  Pt is alert, follows commands appropriately, not in acute distress  HEENT: No icterus, No thrush,  Archie/AT  Cardiovascular: RRR, S1/S2, no rubs, no gallops  Respiratory: Clear to auscultation bilaterally, no wheezing, no crackles, no rhonchi  Abdomen: Soft/ absent BS, mildly tender, without rebound or guarding.  Port scars c/d/i with dermabond.    Extremities: No edema, 2+ pulses  Data Reviewed: Basic Metabolic Panel:  Lab 09/15/12 1610 09/14/12 0515 09/13/12 0630 09/12/12 0445 09/11/12 1933  NA 139 137 137 135 136  K 3.7 3.4* 3.6 4.1 4.0  CL 109 105 105 101 101  CO2 20 22 22 21 20   GLUCOSE 164* 170* 172* 245* 199*  BUN 7 9 11 16 18   CREATININE 0.77 0.79 0.80 0.83 0.91  CALCIUM 8.3* 8.4 8.3* 9.0 9.7  MG 1.9 -- 1.5 -- --  PHOS -- -- -- -- --   Liver Function Tests:  Lab 09/15/12 0520 09/14/12 0515 09/13/12 0630 09/11/12 1933  AST 48* 70* 173* 140*  ALT 97* 146* 224* 70*  ALKPHOS 107 120* 115 92  BILITOT 1.4* 1.9* 3.0* 1.8*  PROT 6.3 6.4 6.1  7.4  ALBUMIN 2.7* 3.0* 2.9* 3.8    Lab 09/14/12 0515 09/11/12 1933  LIPASE 166* >3000*  AMYLASE -- --   No results found for this basename: AMMONIA:5 in the last 168 hours CBC:  Lab 09/14/12 0515 09/13/12 0630 09/12/12 0445 09/11/12 1933  WBC 7.7 7.6 7.9 14.2*  NEUTROABS -- 5.8 -- 11.3*  HGB 10.7* 10.8* 12.4* 13.3  HCT 33.1* 32.7* 38.3* 39.6  MCV 70.7* 69.4* 71.5* 70.7*  PLT 191 186 209 211   Cardiac Enzymes: No results found for this basename: CKTOTAL:5,CKMB:5,CKMBINDEX:5,TROPONINI:5 in the last 168 hours BNP: No components found with this basename: POCBNP:5 CBG:  Lab 09/15/12 1209 09/15/12 0738 09/14/12 2238 09/14/12 2124 09/14/12 1642  GLUCAP 116* 145* 174* 170* 160*    Recent Results (from the past 240 hour(s))  SURGICAL PCR SCREEN      Status: Abnormal   Collection Time   09/15/12  5:53 AM      Component Value Range Status Comment   MRSA, PCR NEGATIVE  NEGATIVE Final    Staphylococcus aureus POSITIVE (*) NEGATIVE Final      Scheduled Meds:    . antiseptic oral rinse  15 mL Mouth Rinse q12n4p  . atorvastatin  40 mg Oral q1800  . carvedilol  12.5 mg Oral BID WC  . chlorhexidine  15 mL Mouth Rinse BID  . ciprofloxacin  400 mg Intravenous On Call to OR  . dorzolamide-timolol  1 drop Right Eye BID  . enoxaparin (LOVENOX) injection  40 mg Subcutaneous Once  . insulin aspart  0-20 Units Subcutaneous TID WC  . insulin aspart  0-5 Units Subcutaneous QHS  . isosorbide mononitrate  90 mg Oral Daily  . methazolamide  50 mg Oral TID  . pantoprazole  40 mg Oral Q0600  . potassium chloride  40 mEq Oral Once  . prednisoLONE acetate  1 drop Right Eye TID  . trimethoprim-polymyxin b  1 drop Right Eye TID   Continuous Infusions:    . sodium chloride 100 mL/hr at 09/15/12 1058     [name], Thea Silversmith, MD [facility] Pager [phone]  If 7PM-7AM, please contact night-coverage [email or web address] Password [email or web address] 09/15/2012, 1:33 PM   LOS: 4 days

## 2012-09-15 NOTE — Progress Notes (Signed)
Patient ID: [name], male   DOB: 1951-10-29, 61 y.o.   MRN: [id number]    Subjective: Pt feels well today.  No complaints  Objective: Vital signs in last 24 hours: Temp:  [97.8 F (36.6 C)-100.9 F (38.3 C)] 97.8 F (36.6 C) (09/25 0528) Pulse Rate:  [68-81] 68  (09/25 0528) Resp:  [18] 18  (09/25 0528) BP: (142-154)/(57-91) 145/91 mmHg (09/25 0528) SpO2:  [96 %-100 %] 98 % (09/25 0528) FiO2 (%):  [21 %] 21 % (09/25 0154) Last BM Date: 09/11/12  Intake/Output from previous day: 09/24 0701 - 09/25 0700 In: 4773.3 [P.O.:600; I.V.:4173.3] Out: 1700 [Urine:1700] Intake/Output this shift: Total I/O In: 0  Out: 300 [Urine:300]  PE: Abd: soft, minimally tender, +BS  Lab Results:   Basename 09/14/12 0515 09/13/12 0630  WBC 7.7 7.6  HGB 10.7* 10.8*  HCT 33.1* 32.7*  PLT 191 186   BMET  Basename 09/15/12 0520 09/14/12 0515  NA 139 137  K 3.7 3.4*  CL 109 105  CO2 20 22  GLUCOSE 164* 170*  BUN 7 9  CREATININE 0.77 0.79  CALCIUM 8.3* 8.4   PT/INR No results found for this basename: LABPROT:2,INR:2 in the last 72 hours CMP     Component Value Date/Time   NA 139 09/15/2012 0520   K 3.7 09/15/2012 0520   CL 109 09/15/2012 0520   CO2 20 09/15/2012 0520   GLUCOSE 164* 09/15/2012 0520   BUN 7 09/15/2012 0520   CREATININE 0.77 09/15/2012 0520   CALCIUM 8.3* 09/15/2012 0520   PROT 6.3 09/15/2012 0520   ALBUMIN 2.7* 09/15/2012 0520   AST 48* 09/15/2012 0520   ALT 97* 09/15/2012 0520   ALKPHOS 107 09/15/2012 0520   BILITOT 1.4* 09/15/2012 0520   GFRNONAA >90 09/15/2012 0520   GFRAA >90 09/15/2012 0520   Lipase     Component Value Date/Time   LIPASE 166* 09/14/2012 0515       Studies/Results: No results found.  Anti-infectives: Anti-infectives     Start     Dose/Rate Route Frequency Ordered Stop   09/15/12 0600   ciprofloxacin (CIPRO) IVPB 400 mg     Comments: On call to OR      400 mg 200 mL/hr over 60 Minutes Intravenous On call to O.R. 09/14/12 6295 09/16/12  0559           Assessment/Plan  1. Gallstone pancreatitis 2. CAD  Plan: 1. Appreciate cardiology input 2. Will proceed with cholecystectomy today.   LOS: 4 days    Travius Crochet E 09/15/2012, 8:51 AM Pager: [phone]

## 2012-09-15 NOTE — Preoperative (Signed)
Beta Blockers   Reason not to administer Beta Blockers:Not Applicable 

## 2012-09-15 NOTE — Anesthesia Postprocedure Evaluation (Signed)
Anesthesia Post Note  Patient: [name]  Procedure(s) Performed: Procedure(s) (LRB): LAPAROSCOPIC CHOLECYSTECTOMY WITH INTRAOPERATIVE CHOLANGIOGRAM (N/A)  Anesthesia type: general  Patient location: PACU  Post pain: Pain level controlled  Post assessment: Patient's Cardiovascular Status Stable  Last Vitals:  Filed Vitals:   09/15/12 1745  BP: 108/32  Pulse:   Temp: 36.9 C  Resp:     Post vital signs: Reviewed and stable  Level of consciousness: sedated  Complications: No apparent anesthesia complications

## 2012-09-15 NOTE — Progress Notes (Signed)
     [facility] Daily Rounding Note 09/15/2012, 9:15 AM  SUBJECTIVE:       No belly pain or nausea, feels well  OBJECTIVE:         Vital signs in last 24 hours:    Temp:  [97.8 F (36.6 C)-100.9 F (38.3 C)] 97.8 F (36.6 C) (09/25 0528) Pulse Rate:  [68-81] 68  (09/25 0528) Resp:  [18] 18  (09/25 0528) BP: (142-154)/(57-91) 145/91 mmHg (09/25 0528) SpO2:  [96 %-100 %] 98 % (09/25 0528) FiO2 (%):  [21 %] 21 % (09/25 0154) Last BM Date: 09/11/12 General: looks non-toxic   Heart: RRR Chest: clear B.  No resp distress Abdomen:  Soft, Minor upper belly tenderness, non-focal, active BS   Intake/Output from previous day: 09/24 0701 - 09/25 0700 In: 4773.3 [P.O.:600; I.V.:4173.3] Out: 1700 [Urine:1700]  Intake/Output this shift: Total I/O In: 0  Out: 300 [Urine:300]  Lab Results:  Basename 09/14/12 0515 09/13/12 0630  WBC 7.7 7.6  HGB 10.7* 10.8*  HCT 33.1* 32.7*  PLT 191 186   BMET  Basename 09/15/12 0520 09/14/12 0515 09/13/12 0630  NA 139 137 137  K 3.7 3.4* 3.6  CL 109 105 105  CO2 20 22 22   GLUCOSE 164* 170* 172*  BUN 7 9 11   CREATININE 0.77 0.79 0.80  CALCIUM 8.3* 8.4 8.3*   LFT  Basename 09/15/12 0520 09/14/12 0515 09/13/12 0630  PROT 6.3 6.4 6.1  ALBUMIN 2.7* 3.0* 2.9*  AST 48* 70* 173*  ALT 97* 146* 224*  ALKPHOS 107 120* 115  BILITOT 1.4* 1.9* 3.0*  BILIDIR -- -- --  IBILI -- -- --    ASSESMENT: * Acute biliary pancreatitis. Clinically improved. LFT trend is declining.  No ductal dilatation on imaging.  * Blind  * IDDM  * Remote surgery for bleeding PUD when pt was in his 69s.  * Microcytic anemia.  * Adenomatous colon polyps in 10/2010.    PLAN: *  For lap chole today.  Will look for IOC.     LOS: 4 days   Jennye Moccasin  09/15/2012, 9:15 AM Pager: [phone]    I have taken an interval history, reviewed the chart and examined the patient. I agree with the extender's note, impression and recommendations. LFTs improving. For lap chole  with IOC today.  Venita Lick. Russella Dar MD Clementeen Graham

## 2012-09-15 NOTE — Progress Notes (Signed)
Pt requesting something to eat post op. Paged Jolaine Click NP page surgery for diet order. CCS paged.

## 2012-09-15 NOTE — Transfer of Care (Signed)
Immediate Anesthesia Transfer of Care Note  Patient: [name]  Procedure(s) Performed: Procedure(s) (LRB) with comments: LAPAROSCOPIC CHOLECYSTECTOMY WITH INTRAOPERATIVE CHOLANGIOGRAM (N/A)  Patient Location: PACU  Anesthesia Type: General  Level of Consciousness: awake, alert  and oriented  Airway & Oxygen Therapy: Patient Spontanous Breathing and Patient connected to nasal cannula oxygen  Post-op Assessment: Report given to PACU RN and Post -op Vital signs reviewed and stable  Post vital signs: Reviewed and stable  Complications: No apparent anesthesia complications

## 2012-09-15 NOTE — Addendum Note (Signed)
Addendum  created 09/15/12 1802 by Jeani Hawking, CRNA   Modules edited:Anesthesia Medication Administration

## 2012-09-15 NOTE — Progress Notes (Signed)
Cardiac clearance obtained.  To OR for lap chole

## 2012-09-16 ENCOUNTER — Encounter (HOSPITAL_COMMUNITY): Payer: Self-pay | Admitting: Surgery

## 2012-09-16 LAB — GLUCOSE, CAPILLARY
Glucose-Capillary: 147 mg/dL — ABNORMAL HIGH (ref 70–99)
Glucose-Capillary: 150 mg/dL — ABNORMAL HIGH (ref 70–99)
Glucose-Capillary: 181 mg/dL — ABNORMAL HIGH (ref 70–99)

## 2012-09-16 MED ORDER — TELMISARTAN 40 MG PO TABS: 40.0000 mg | ORAL_TABLET | Freq: Every day | ORAL | Status: DC

## 2012-09-16 MED ORDER — BISACODYL 5 MG PO TBEC: 10.0000 mg | DELAYED_RELEASE_TABLET | Freq: Every day | ORAL | Status: DC | PRN

## 2012-09-16 MED ORDER — OXYCODONE HCL 5 MG PO TABS: 5.0000 mg | ORAL_TABLET | ORAL | Status: DC | PRN

## 2012-09-16 MED ORDER — POLYMYXIN B-TRIMETHOPRIM 10000-0.1 UNIT/ML-% OP SOLN: 1.0000 [drp] | Freq: Three times a day (TID) | OPHTHALMIC | Status: AC

## 2012-09-16 MED ORDER — DOCUSATE SODIUM 100 MG PO CAPS: 100.0000 mg | ORAL_CAPSULE | Freq: Two times a day (BID) | ORAL | Status: DC

## 2012-09-16 MED ORDER — ATORVASTATIN CALCIUM 40 MG PO TABS: 40.0000 mg | ORAL_TABLET | Freq: Every day | ORAL | Status: DC

## 2012-09-16 MED ORDER — PREDNISOLONE ACETATE 1 % OP SUSP: 1.0000 [drp] | Freq: Three times a day (TID) | OPHTHALMIC | Status: DC

## 2012-09-16 MED ORDER — ONDANSETRON HCL 4 MG PO TABS: 4.0000 mg | ORAL_TABLET | Freq: Four times a day (QID) | ORAL | Status: DC | PRN

## 2012-09-16 MED ORDER — METFORMIN HCL 1000 MG PO TABS: 1000.0000 mg | ORAL_TABLET | Freq: Two times a day (BID) | ORAL | Status: DC

## 2012-09-16 MED ORDER — ISOSORBIDE MONONITRATE ER 60 MG PO TB24: 90.0000 mg | ORAL_TABLET | Freq: Every day | ORAL | Status: DC

## 2012-09-16 MED ORDER — COLESEVELAM HCL 3.75 G PO PACK: 1.0000 | PACK | Freq: Every day | ORAL | Status: DC

## 2012-09-16 MED ORDER — RANITIDINE HCL 150 MG PO TABS: 150.0000 mg | ORAL_TABLET | Freq: Two times a day (BID) | ORAL | Status: DC

## 2012-09-16 MED ORDER — DORZOLAMIDE HCL-TIMOLOL MAL 2-0.5 % OP SOLN: 1.0000 [drp] | Freq: Two times a day (BID) | OPHTHALMIC | Status: DC

## 2012-09-16 MED ORDER — CARVEDILOL 12.5 MG PO TABS: 12.5000 mg | ORAL_TABLET | Freq: Two times a day (BID) | ORAL | Status: DC

## 2012-09-16 NOTE — Progress Notes (Signed)
Subjective:  Doing well. Hungry. No CP, no SOB  Objective:  Vital Signs in the last 24 hours: Temp:  [97.8 F (36.6 C)-99.6 F (37.6 C)] 99.6 F (37.6 C) (09/26 1610) Pulse Rate:  [71-78] 71  (09/26 0638) Resp:  [16-22] 17  (09/26 9604) BP: (108-163)/(32-80) 163/72 mmHg (09/26 0638) SpO2:  [94 %-100 %] 96 % (09/26 5409)  Intake/Output from previous day: 09/25 0701 - 09/26 0700 In: 3951.7 [I.V.:3951.7] Out: 1490 [Urine:1450; Blood:40]   Physical Exam: General: Well developed, well nourished, in no acute distress. Head:  Normocephalic and atraumatic. Lungs: Clear to auscultation and percussion. Heart: Normal S1 and S2.  No murmur, rubs or gallops.  Abdomen: soft, non-tender, positive bowel sounds. Obese Extremities: No clubbing or cyanosis. No edema. Neurologic: Alert and oriented x 3.    Lab Results:  [facility] 09/14/12 0515  WBC 7.7  HGB 10.7*  PLT 191    Basename 09/15/12 0520 09/14/12 0515  NA 139 137  K 3.7 3.4*  CL 109 105  CO2 20 22  GLUCOSE 164* 170*  BUN 7 9  CREATININE 0.77 0.79   No results found for this basename: TROPONINI:2,CK,MB:2 in the last 72 hours Hepatic Function Panel  Basename 09/15/12 0520  PROT 6.3  ALBUMIN 2.7*  AST 48*  ALT 97*  ALKPHOS 107  BILITOT 1.4*  BILIDIR --  IBILI --    Imaging: Dg Cholangiogram Operative  09/15/2012  *RADIOLOGY REPORT*  Clinical Data:   61 year old male undergoing laparoscopic cholecystectomy.  The  INTRAOPERATIVE CHOLANGIOGRAM  Technique:  Cholangiographic images from the C-arm fluoroscopic device were submitted for interpretation post-operatively.  Please see the procedural report for the amount of contrast and the fluoroscopy time utilized.  Comparison:  Abdominal ultrasound 09/13/2012 and CT 09/12/2012.  Fluoroscopy time of 0.5 minutes was utilized.  Findings:  Surgical clips at the level of the cystic duct which has cannulated.  Contrast injection reveals a fairly nondilated intra and extra panic  biliary tree.  No filling defect.  No contrast extravasation.  There is superimposed contrast in the colon, there is contrast emptying into the duodenum identified.  IMPRESSION: Negative intraoperative cholangiogram.   Original Report Authenticated By: Harley Hallmark, M.D.        EKG:  NSR, old inf infarct pattern no change    Assessment/Plan:  Principal Problem:  *Acute pancreatitis Active Problems:  DIABETES MELLITUS, TYPE II  CORONARY ARTERY DISEASE  HYPERTENSION, BENIGN ESSENTIAL  Obesity  Abdominal pain, acute, epigastric  - CAD - stable post op. Would advocate ASA when able to resume from surgical standpoint. Cont. Coreg, imdur. Lasix on hold (pancreatitis risk). OK to have the readdressed by PCP in close follow up.   He states he will call office for appt. With me. It has been a while since he has seen me. He states he has been travelling to [place] often and gas money has been tight.   Will sign off.    SKAINS, MARK 09/16/2012, 8:47 AM

## 2012-09-16 NOTE — Discharge Summary (Signed)
Physician Discharge Summary  [name] YNW:[id number] DOB: 04/23/1951 DOA: 09/11/2012  PCP: Sanda Linger, MD  Admit date: 09/11/2012 Discharge date: 09/16/2012  Recommendations for Outpatient Follow-up:  1. Patient should not lift more than 15-lbs for the next two weeks and should eat a low fat and non-spicy food diet and advance as tolerated.  He may use pain medication as needed for abdominal discomfort. 2. Follow up with primary care doctor within 1 week for follow up CMP (trend bilirubin and transaminases) and CBC for microcytic anemia.  Please address januvia and lasix at the follow up appointment.   3. Follow up with surgery as below.  Discharge Diagnoses:  Principal Problem:  *Acute pancreatitis Active Problems:  DIABETES MELLITUS, TYPE II  CORONARY ARTERY DISEASE  HYPERTENSION, BENIGN ESSENTIAL  Obesity  Abdominal pain, acute, epigastric   Discharge Condition: stable, improved  Diet recommendation: Low fat, low carbohydrate, 2gm sodium, non-spicy food   Wt Readings from Last 3 Encounters:  09/12/12 113.1 kg (249 lb 5.4 oz)  09/12/12 113.1 kg (249 lb 5.4 oz)  03/31/12 115.1 kg (253 lb 12 oz)    History of present illness:   [name] is a 60 y.o. male with Multiple Medical problems who presents with complaints of sudden epigastric pain in the afternoon, that was described as constant sharp and stabbing, 10/10 pain, associated with nausea and vomiting. He reports not being able to hold down any foods or liquids. He was evaluated in the ED and found to have to a lipase level of 3000, and a ct scan of the ABD was performed which revealed acute pancreatitis. He denied having any previous bouts of pancreatitis in the past.   Hospital Course:   The patient was made NPO and given IV fluids and pain medication.  It is thought that he had primarily gallstone-induced pancreatitis, however, lasix-induced or januvia-induced pancreatitis is also possible and this medication was  discontinued.  He had a lipid panel which was negative for severe hypertriglyceridemia, however, his lipids were not at goal so his statin was changed to atorvastatin 40mg .  He was seen by GI and General Surgery due to elevated bilirubin and transaminases, however, his liver enzymes trended down on their own.  CT scan of abdomen revealed multiple gallstones in the gallbladder and the patient underwent laparoscopic cholecystectomy on 9/25.  He tolerated the procedure well and he was able to tolerate food on the morning of 9/26.  His pain was well controlled.    He should talk to his primary care doctor about restarting lasix.   He should resume his home insulin and metformin, but stop his [place]. He should continue polytrim eye drops until 9/28 for presumptive bacterial conjunctivitis.    Procedures:  CT abd/pelvis 9/22  [place] abd complete 9/23  Laparoscopy cholecystectomy 9/25 with intraoperative cholangiogram showing contrast flowing into the duodenum.   Consultations:  GI  General Surgery  Discharge Exam: Filed Vitals:   09/16/12 0638  BP: 163/72  Pulse: 71  Temp: 99.6 F (37.6 C)  Resp: 17   Filed Vitals:   09/15/12 1845 09/15/12 2100 09/16/12 0152 09/16/12 0638  BP: 132/80 135/67 132/49 163/72  Pulse:  78 71 71  Temp:  98.4 F (36.9 C) 99.1 F (37.3 C) 99.6 F (37.6 C)  TempSrc:  Oral Oral Oral  Resp:  16 17 17   Height:      Weight:      SpO2:  94% 99% 96%    General:  Obese [name], no acute distress  HEENT: No icterus, No thrush, [place]/AT  Cardiovascular: RRR, S1/S2, no rubs, no gallops  Respiratory: CTAB Abdomen: Soft/ hypoactive BS, mildly tender, without rebound or guarding. Port scars c/d/i with dermabond.  Extremities: No edema, 2+ pulses  Discharge Instructions      Discharge Orders    Future Orders Please Complete By Expires   Diet Carb Modified      Diet - low sodium heart healthy      Increase activity slowly      Comments:   Do NOT lift more than 15  pounds for the next two weeks.   Call MD for:      Comments:   Call 911 if you have chest pain, shortness of breath, facial droop, slurred speech, confusion, numbness or weakness of an arm or leg.   Call MD for:  temperature >100.4      Call MD for:  persistant nausea and vomiting      Call MD for:  severe uncontrolled pain      Call MD for:  redness, tenderness, or signs of infection (pain, swelling, redness, odor or green/yellow discharge around incision site)      Call MD for:  difficulty breathing, headache or visual disturbances      Call MD for:  hives      Call MD for:  persistant dizziness or light-headedness      Call MD for:  extreme fatigue      Discharge instructions      Comments:   You were hospitalized for pancreatitis.  Pancreatitis can be caused by gallstones and medications among other things.  You were on two medications that are known to cause pancreatitis, lasix and januvia.  Please STOP these medications and talk to your primary care doctor before restarting them.  You had gallstones and had a laparoscopic cholecystectomy on 9/25 to remove your gallbladder.  You were able to eat and drink without pain on 9/26.  Please eat a bland diet and follow up with your primary care doctor within one week of discharge for repeat bloodwork.    You had a cholesterol panel while in the hospital which showed your cholesterol was not as controlled as we would like it so your pravastatin was changed to a more potent cholesterol medication called atorvastatin.    You had an eye infection and were started on an eye drop called polytrim.  Please use this through 9/28 and then stop.       Medication List     As of 09/16/2012  8:46 AM    STOP taking these medications         furosemide 40 MG tablet   Commonly known as: LASIX      pravastatin 40 MG tablet   Commonly known as: PRAVACHOL      sitaGLIPtin 100 MG tablet   Commonly known as: JANUVIA      TAKE these medications          atorvastatin 40 MG tablet   Commonly known as: LIPITOR   Take 1 tablet (40 mg total) by mouth daily at 6 PM.      bisacodyl 5 MG EC tablet   Commonly known as: DULCOLAX   Take 2 tablets (10 mg total) by mouth daily as needed for constipation.      carvedilol 12.5 MG tablet   Commonly known as: COREG   Take 1 tablet (12.5 mg total) by mouth 2 (two) times daily  with a meal.      Colesevelam HCl 3.75 G Pack   Take 1 each by mouth daily.      docusate sodium 100 MG capsule   Commonly known as: COLACE   Take 1 capsule (100 mg total) by mouth 2 (two) times daily.      dorzolamide-timolol 22.3-6.8 MG/ML ophthalmic solution   Commonly known as: COSOPT   Place 1 drop into the right eye 2 (two) times daily.      insulin lispro protamine-insulin lispro (75-25) 100 UNIT/ML Susp   Commonly known as: HUMALOG 75/25   Inject 45-85 Units into the skin 2 (two) times daily with a meal. Takes 85 units in the am & 45 units in the pm      isosorbide mononitrate 60 MG 24 hr tablet   Commonly known as: IMDUR   Take 1.5 tablets (90 mg total) by mouth daily.      metFORMIN 1000 MG tablet   Commonly known as: GLUCOPHAGE   Take 1 tablet (1,000 mg total) by mouth 2 (two) times daily with a meal.      methazolamide 50 MG tablet   Commonly known as: NEPTAZANE   Take 50 mg by mouth 3 (three) times daily.      omega-3 acid ethyl esters 1 G capsule   Commonly known as: LOVAZA   Take 1 g by mouth 2 (two) times daily.      ondansetron 4 MG tablet   Commonly known as: ZOFRAN   Take 1 tablet (4 mg total) by mouth every 6 (six) hours as needed for nausea.      oxyCODONE 5 MG immediate release tablet   Commonly known as: Oxy IR/ROXICODONE   Take 1 tablet (5 mg total) by mouth every 4 (four) hours as needed.      prednisoLONE acetate 1 % ophthalmic suspension   Commonly known as: PRED FORTE   Place 1 drop into the right eye 3 (three) times daily.      ranitidine 150 MG tablet   Commonly known as:  ZANTAC   Take 1 tablet (150 mg total) by mouth 2 (two) times daily.      telmisartan 40 MG tablet   Commonly known as: MICARDIS   Take 1 tablet (40 mg total) by mouth daily.      trimethoprim-polymyxin b ophthalmic solution   Commonly known as: POLYTRIM   Place 1 drop into the right eye 3 (three) times daily.         Follow-up Information    Follow up with Sanda Linger, MD. Schedule an appointment as soon as possible for a visit in 1 week.   Contact information:   [address] [place] [address] [phone]       Follow up with [facility]. On 10/05/2012. (2:00pm, arrive at 1:30pm)    Contact information:   [facility] [address] [place], [place] [address] [phone]          The results of significant diagnostics from this hospitalization (including imaging, microbiology, ancillary and laboratory) are listed below for reference.    Significant Diagnostic Studies: Dg Cholangiogram Operative  09/15/2012  *RADIOLOGY REPORT*  Clinical Data:   61 year old male undergoing laparoscopic cholecystectomy.  The  INTRAOPERATIVE CHOLANGIOGRAM  Technique:  Cholangiographic images from the C-arm fluoroscopic device were submitted for interpretation post-operatively.  Please see the procedural report for the amount of  contrast and the fluoroscopy time utilized.  Comparison:  Abdominal ultrasound 09/13/2012 and CT 09/12/2012.  Fluoroscopy time of 0.5 minutes was utilized.  Findings:  Surgical clips at the level of the cystic duct which has cannulated.  Contrast injection reveals a fairly nondilated intra and extra panic biliary tree.  No filling defect.  No contrast extravasation.  There is superimposed contrast in the colon, there is contrast emptying into the duodenum identified.  IMPRESSION: Negative intraoperative cholangiogram.   Original Report Authenticated By: Harley Hallmark, M.D.    US Abdomen Complete  09/13/2012   *RADIOLOGY REPORT*  Clinical Data:  Pancreatitis, gallstones.  COMPLETE ABDOMINAL ULTRASOUND  Comparison:  09/12/2012 CT  Findings: Technically challenging examination due to patient body habitus and bowel gas artifact, resulting in limited acoustic windows.  Gallbladder:  Gallstones.  No wall thickening or pericholecystic fluid.  Negative sonographic Murphy's sign.  Common bile duct:  Normal proximal diameter at 5 mm.  The distal duct is obscured patient body habitus/bowel gas artifact.  Liver:  Diffusely increased in echogenicity.  No focal abnormality identified.  IVC:  Appears normal.  Pancreas:  Inadequately visualized due to patient body habitus/bowel gas artifact.  Spleen:  Measures 6 cm oblique.  No focal abnormality.  Right Kidney:  Measures 11.7 cm.  No hydronephrosis or focal abnormality.  Portions of the parenchyma are incompletely visualized due to limited acoustic windows.  Left Kidney:  Measures 10.8 cm.  No hydronephrosis or focal abnormality.  Parenchymal evaluation is suboptimal due to patient body habitus and limited acoustic windows.  Abdominal aorta:  Inadequately visualized.  No aneurysmal dilatation on recent CT.  IMPRESSION:  Gallstones.  No sonographic evidence for cholecystitis.  No proximal CBD dilatation.  The distal duct is obscured.  Pancreas inadequately visualized.  Hepatic steatosis.   Original Report Authenticated By: Waneta Martins, M.D.    Ct Abdomen Pelvis W Contrast  09/12/2012  *RADIOLOGY REPORT*  Clinical Data: elevated lipase rule out pancreatitis ABDOMINAL PAIN EMESIS  CT ABDOMEN AND PELVIS WITH CONTRAST  Technique:  Multidetector CT imaging of the abdomen and pelvis was performed following the standard protocol during bolus administration of intravenous contrast.  Contrast: OMNIPAQUE IOHEXOL 300 MG/ML  SOLN  Comparison: None.  Findings: Calcifications involving the LAD, RCA and left circumflex coronary arteries noted.  No pericardial or pleural effusions.   There is mild fatty infiltration of the liver.  No suspicious liver abnormalities. Multiple tiny stones versus gallbladder sludge noted within the lumen of the gallbladder.  No gallbladder wall thickening.  No biliary dilatation.  Edema of the pancreatic parenchyma with peripancreatic fat stranding is noted consistent with pancreatitis.  No evidence for pancreatic necrosis or pseudocyst.  No fluid collections noted within the upper abdomen. The spleen is normal.  Both adrenal glands are normal.  Normal appearance of the right kidney.  The left kidney is normal.  The urinary bladder is within normal limits.  Prostate gland is enlarged measuring 4.4 cm in transverse dimension, image 87.  The seminal vesicles have a symmetric appearance.  No enlarged upper abdominal lymph nodes.  There is no pelvic or inguinal adenopathy.  The stomach appears normal.  The small bowel loops are unremarkable.  The appendix is noted and appears normal.  The normal appearance of the colon.  Multiple sigmoid diverticula identified without acute inflammation.  Review of the visualized osseous structures is significant for multilevel lumbar degenerative disc disease.  IMPRESSION:  1.  Acute pancreatitis.  No evidence for  pancreatic necrosis or pseudocyst.  2.  Tiny gallstones versus gallbladder sludge.  3.  Fatty infiltration of the liver.   Original Report Authenticated By: Rosealee Albee, M.D.    Dg Abd Acute W/chest  09/11/2012  *RADIOLOGY REPORT*  Clinical Data: Abdominal pain  ACUTE ABDOMEN SERIES (ABDOMEN 2 VIEW & CHEST 1 VIEW)  Comparison: 03/18/2009  Findings: Mild cardiac enlargement.  No pleural effusion or edema. No airspace consolidation.  No dilated loops of small bowel or air- fluid levels noted.  There is gas and stool noted within the colon. No free intraperitoneal air identified.  IMPRESSION:  1.  No acute cardiopulmonary abnormalities.   Original Report Authenticated By: Rosealee Albee, M.D.      Microbiology: Recent Results (from the past 240 hour(s))  SURGICAL PCR SCREEN     Status: Abnormal   Collection Time   09/15/12  5:53 AM      Component Value Range Status Comment   MRSA, PCR NEGATIVE  NEGATIVE Final    Staphylococcus aureus POSITIVE (*) NEGATIVE Final      Labs: Basic Metabolic Panel:  Lab 09/15/12 1610 09/14/12 0515 09/13/12 0630 09/12/12 0445 09/11/12 1933  NA 139 137 137 135 136  K 3.7 3.4* 3.6 4.1 4.0  CL 109 105 105 101 101  CO2 20 22 22 21 20   GLUCOSE 164* 170* 172* 245* 199*  BUN 7 9 11 16 18   CREATININE 0.77 0.79 0.80 0.83 0.91  CALCIUM 8.3* 8.4 8.3* 9.0 9.7  MG 1.9 -- 1.5 -- --  PHOS -- -- -- -- --   Liver Function Tests:  Lab 09/15/12 0520 09/14/12 0515 09/13/12 0630 09/11/12 1933  AST 48* 70* 173* 140*  ALT 97* 146* 224* 70*  ALKPHOS 107 120* 115 92  BILITOT 1.4* 1.9* 3.0* 1.8*  PROT 6.3 6.4 6.1 7.4  ALBUMIN 2.7* 3.0* 2.9* 3.8    Lab 09/14/12 0515 09/11/12 1933  LIPASE 166* >3000*  AMYLASE -- --   No results found for this basename: AMMONIA:5 in the last 168 hours CBC:  Lab 09/14/12 0515 09/13/12 0630 09/12/12 0445 09/11/12 1933  WBC 7.7 7.6 7.9 14.2*  NEUTROABS -- 5.8 -- 11.3*  HGB 10.7* 10.8* 12.4* 13.3  HCT 33.1* 32.7* 38.3* 39.6  MCV 70.7* 69.4* 71.5* 70.7*  PLT 191 186 209 211   Cardiac Enzymes: No results found for this basename: CKTOTAL:5,CKMB:5,CKMBINDEX:5,TROPONINI:5 in the last 168 hours BNP: BNP (last 3 results)  Basename 03/31/12 1003  PROBNP 5.0   CBG:  Lab 09/16/12 0729 09/16/12 0300 09/15/12 2107 09/15/12 1910 09/15/12 1209  GLUCAP 150* 147* 150* 176* 116*    Time coordinating discharge: 35 minutes  Signed:  Travion Ke  [facility] 09/16/2012, 8:46 AM

## 2012-09-16 NOTE — Progress Notes (Signed)
S/P lap chole yesterday. The IOC was negative. GI signing off.

## 2012-09-16 NOTE — Progress Notes (Signed)
Discharge instructions/Med Rec Sheet reviewed w/ pt. Pt expressed understanding and copies given w/ prescriptions. Pt d/c'd in stable condition via w/c, accompanied by NT  

## 2012-09-16 NOTE — Progress Notes (Signed)
Patient ID: [name], male   DOB: 10-17-1951, 61 y.o.   MRN: [id number] 1 Day Post-Op  Subjective: Pt feels well this morning.  Not much pain.  Tolerating full liquids.  Objective: Vital signs in last 24 hours: Temp:  [97.8 F (36.6 C)-99.6 F (37.6 C)] 99.6 F (37.6 C) (09/26 4098) Pulse Rate:  [71-78] 71  (09/26 0638) Resp:  [16-22] 17  (09/26 1191) BP: (108-163)/(32-80) 163/72 mmHg (09/26 0638) SpO2:  [94 %-100 %] 96 % (09/26 0638) Last BM Date: 09/11/12  Intake/Output from previous day: 09/25 0701 - 09/26 0700 In: 3951.7 [I.V.:3951.7] Out: 1490 [Urine:1450; Blood:40] Intake/Output this shift:    PE: Abd: soft, essentially nontender, ND, +BS, incisions c/d/i with dermabond  Lab Results:   Basename 09/14/12 0515  WBC 7.7  HGB 10.7*  HCT 33.1*  PLT 191   BMET  Basename 09/15/12 0520 09/14/12 0515  NA 139 137  K 3.7 3.4*  CL 109 105  CO2 20 22  GLUCOSE 164* 170*  BUN 7 9  CREATININE 0.77 0.79  CALCIUM 8.3* 8.4   PT/INR No results found for this basename: LABPROT:2,INR:2 in the last 72 hours CMP     Component Value Date/Time   NA 139 09/15/2012 0520   K 3.7 09/15/2012 0520   CL 109 09/15/2012 0520   CO2 20 09/15/2012 0520   GLUCOSE 164* 09/15/2012 0520   BUN 7 09/15/2012 0520   CREATININE 0.77 09/15/2012 0520   CALCIUM 8.3* 09/15/2012 0520   PROT 6.3 09/15/2012 0520   ALBUMIN 2.7* 09/15/2012 0520   AST 48* 09/15/2012 0520   ALT 97* 09/15/2012 0520   ALKPHOS 107 09/15/2012 0520   BILITOT 1.4* 09/15/2012 0520   GFRNONAA >90 09/15/2012 0520   GFRAA >90 09/15/2012 0520   Lipase     Component Value Date/Time   LIPASE 166* 09/14/2012 0515       Studies/Results: Dg Cholangiogram Operative  09/15/2012  *RADIOLOGY REPORT*  Clinical Data:   61 year old male undergoing laparoscopic cholecystectomy.  The  INTRAOPERATIVE CHOLANGIOGRAM  Technique:  Cholangiographic images from the C-arm fluoroscopic device were submitted for interpretation post-operatively.  Please  see the procedural report for the amount of contrast and the fluoroscopy time utilized.  Comparison:  Abdominal ultrasound 09/13/2012 and CT 09/12/2012.  Fluoroscopy time of 0.5 minutes was utilized.  Findings:  Surgical clips at the level of the cystic duct which has cannulated.  Contrast injection reveals a fairly nondilated intra and extra panic biliary tree.  No filling defect.  No contrast extravasation.  There is superimposed contrast in the colon, there is contrast emptying into the duodenum identified.  IMPRESSION: Negative intraoperative cholangiogram.   Original Report Authenticated By: Harley Hallmark, M.D.     Anti-infectives: Anti-infectives     Start     Dose/Rate Route Frequency Ordered Stop   09/15/12 0600   ciprofloxacin (CIPRO) IVPB 400 mg     Comments: On call to OR      400 mg 200 mL/hr over 60 Minutes Intravenous On call to O.R. 09/14/12 4782 09/16/12 0559           Assessment/Plan  1. Gallstone pancreatitis 2. S/p lap chole  Plan: 1. Advance to carb modified diet, otherwise patient stable for dc home from our stand point 2. Follow up with [place] in 2 weeks    LOS: 5 days    OSBORNE,KELLY E 09/16/2012, 8:38 AM Pager: [phone]   I came by to see the patient and he  was complaining to the nurse about the way that he was treated downstairs yesterday.  He complained about the way he was treated in the holding area and why he was kept down there so long.  His family was not in the surgical waiting area and so I did not speak with them but when I spoke with him in the PACU  I informed him of this and discussed what I had done at the time of his operation.  He did not vocalize any concern at that time and hence I was surprised to hear this complaint.  Nursing will take this up with the people in the PACU.

## 2012-09-27 ENCOUNTER — Ambulatory Visit: Payer: Medicare Other | Admitting: Internal Medicine

## 2012-10-01 ENCOUNTER — Other Ambulatory Visit (INDEPENDENT_AMBULATORY_CARE_PROVIDER_SITE_OTHER): Payer: Medicare Other

## 2012-10-01 ENCOUNTER — Encounter: Payer: Self-pay | Admitting: Internal Medicine

## 2012-10-01 ENCOUNTER — Ambulatory Visit (INDEPENDENT_AMBULATORY_CARE_PROVIDER_SITE_OTHER): Payer: Medicare Other | Admitting: Internal Medicine

## 2012-10-01 ENCOUNTER — Telehealth: Payer: Self-pay | Admitting: Internal Medicine

## 2012-10-01 VITALS — BP 130/64 | HR 80 | Temp 97.5°F | Resp 16 | Wt 243.8 lb

## 2012-10-01 DIAGNOSIS — N319 Neuromuscular dysfunction of bladder, unspecified: Secondary | ICD-10-CM | POA: Insufficient documentation

## 2012-10-01 DIAGNOSIS — N410 Acute prostatitis: Secondary | ICD-10-CM

## 2012-10-01 DIAGNOSIS — E119 Type 2 diabetes mellitus without complications: Secondary | ICD-10-CM

## 2012-10-01 DIAGNOSIS — I10 Essential (primary) hypertension: Secondary | ICD-10-CM

## 2012-10-01 DIAGNOSIS — N400 Enlarged prostate without lower urinary tract symptoms: Secondary | ICD-10-CM

## 2012-10-01 LAB — URINALYSIS, ROUTINE W REFLEX MICROSCOPIC
Bilirubin Urine: NEGATIVE
Hgb urine dipstick: NEGATIVE
Urine Glucose: NEGATIVE
Urobilinogen, UA: 0.2 (ref 0.0–1.0)

## 2012-10-01 MED ORDER — CIPROFLOXACIN HCL 500 MG PO TABS: 500.0000 mg | ORAL_TABLET | Freq: Two times a day (BID) | ORAL | Status: DC

## 2012-10-01 NOTE — Assessment & Plan Note (Signed)
Urology referral

## 2012-10-01 NOTE — Telephone Encounter (Signed)
Caller: Ibn/Patient; Patient Name: [name]; PCP: Sanda Linger (Adults only); Best Callback Phone Number: [phone] Patient is calling about was seen in office 10/01/12 and nurse kept prescription for antibiotic for prostate infection and patient now needs that called into pharmacy.   As seen in EPIC, prescription for Cipro 500mg  BID #60, no refills, called into [facility] @ [phone] to [facility], pharmacy tech.

## 2012-10-01 NOTE — Patient Instructions (Signed)
Prostatitis  The prostate gland is about the size and shape of a walnut. It is located just below your bladder. It produces one of the components of semen, which is made up of sperm and the fluids that help nourish and transport it out from the testicles. Prostatitis is redness, soreness, and swelling (inflammation) of the prostate gland.   There are 3 types of prostatitis:   Acute bacterial prostatitis This is the least common type of prostatitis. It starts quickly and usually leads to a bladder infection. It can occur at any age.   Chronic bacterial prostatitis This is a persistent bacterial infection in the prostate.It usually develops from repeated acute bacterial prostatitis or acute bacterial prostatitis that was not properly treated. It can occur in men of any age but is most common in middle-aged men whose prostate has begun to enlarge.   Chronic prostatitis chronic pelvic pain syndrome This is the most common type of prostatitis. It is inflammation of the prostate gland that is not caused by a bacterial infection. The cause is unknown.  CAUSES  The cause of acute and chronic bacterial prostatitis is a bacterial infection. The exact cause of chronic prostatitis and chronic pelvic pain syndrome and asymptomatic inflammatory prostatitis is unknown.   SYMPTOMS   Symptoms can vary depending upon the type of prostatitis that exists. There can also be overlap in symptoms. Possible symptoms for each type of prostatitis are listed below.  Acute bacterial prostatitis   Painful urination.   Fever or chills.   Muscle or joint pains.   Low back pain.   Low abdominal pain.   Inability to empty bladder completely.   Sudden urge to urinate.   Frequent urination.   Difficulty starting urine stream.   Weak urine stream.   Discharge from the urethra.   Dribbling after urination.   Rectal pain.   Pain in the testicles, penis, or tip of the penis.   Pain in the space between the anus and scrotum  (perineum).   Problems with sexual function.   Painful ejaculation.   Bloody semen.  Chronic bacterial prostatitis   The symptoms are similar to those of acute bacterial prostatitis, but they usually are much less severe. Fever, chills, and muscle and joint pain are not associated with chronic bacterial prostatitis.  Chronic prostatitis chronic pelvic pain syndrome   Symptoms typically include a dull ache in the scrotum and the perineum.  DIAGNOSIS   In order to diagnose prostatitis, your caregiver will ask about your symptoms. If acute or chronic bacterial prostatitis is suspected, a urine sample will be taken and tested (urinalysis). This is to see if there is bacteria in your urine. If the urinalysis result is negative for bacteria, your caregiver may use a finger to feel your prostate (digital rectal exam). This exam helps your caregiver determine if your prostate is swollen and tender.  TREATMENT   Treatment for prostatitis depends on the cause. If a bacterial infection is the cause, it can be treated with antibiotic medicine. In cases of chronic bacterial prostatitis, the use of antibiotics for up to 1 month may be necessary. Your caregiver may instruct you to take sitz baths to help relieve pain. A sitz bath is a bath of hot water in which your hips and buttocks are under water.  HOME CARE INSTRUCTIONS    Take all medicines as directed by your caregiver.   Take sitz baths as directed by your caregiver.  SEEK MEDICAL CARE IF:      Your symptoms get worse, not better.   You have a fever.  SEEK IMMEDIATE MEDICAL CARE IF:    You have chills.   You feel nauseous or vomit.   You feel lightheaded or faint.   You are unable to urinate.   You have blood or blood clots in your urine.  Document Released: 12/05/2000 Document Revised: 03/01/2012 Document Reviewed: 11/10/2011  [employer] Patient Information 2013 [employer].

## 2012-10-01 NOTE — Assessment & Plan Note (Signed)
BP is well controlled 

## 2012-10-01 NOTE — Progress Notes (Signed)
Subjective:    Patient ID: [name], male    DOB: 25-Feb-1951, 61 y.o.   MRN: [id number]  Benign Prostatic Hypertrophy This is a recurrent problem. The problem has been gradually worsening since onset. Irritative symptoms include frequency, nocturia and urgency. Obstructive symptoms include dribbling, incomplete emptying and an intermittent stream. Obstructive symptoms do not include a slower stream, straining or a weak stream. Pertinent negatives include no chills, dysuria, genital pain, hematuria, hesitancy, nausea or vomiting. AUA score is 0-7. He is not sexually active. Nothing aggravates the symptoms. Past treatments include nothing.      Review of Systems  Constitutional: Negative for fever, chills, diaphoresis, activity change, appetite change, fatigue and unexpected weight change.  HENT: Negative.   Eyes: Negative.   Respiratory: Negative for cough, chest tightness, shortness of breath, wheezing and stridor.   Cardiovascular: Negative for chest pain, palpitations and leg swelling.  Gastrointestinal: Negative for nausea, vomiting, abdominal pain and diarrhea.  Genitourinary: Positive for urgency, frequency, incomplete emptying and nocturia. Negative for dysuria, hesitancy, hematuria, flank pain, decreased urine volume, enuresis and difficulty urinating.  Musculoskeletal: Negative.   Skin: Negative.   Neurological: Negative.   Hematological: Negative for adenopathy. Does not bruise/bleed easily.  Psychiatric/Behavioral: Negative.        Objective:   Physical Exam  Vitals reviewed. Constitutional: He is oriented to person, place, and time. He appears well-developed and well-nourished. No distress.  HENT:  Head: Normocephalic and atraumatic.  Mouth/Throat: Oropharynx is clear and moist. No oropharyngeal exudate.  Eyes: Conjunctivae normal are normal. Right eye exhibits no discharge. Left eye exhibits no discharge. No scleral icterus.  Neck: Normal range of motion. Neck  supple. No JVD present. No tracheal deviation present. No thyromegaly present.  Cardiovascular: Normal rate, regular rhythm, normal heart sounds and intact distal pulses.  Exam reveals no gallop and no friction rub.   No murmur heard. Pulmonary/Chest: Effort normal and breath sounds normal. No stridor. No respiratory distress. He has no wheezes. He has no rales. He exhibits no tenderness.  Abdominal: Soft. Bowel sounds are normal. He exhibits no distension and no mass. There is no tenderness. There is no rebound and no guarding. Hernia confirmed negative in the right inguinal area and confirmed negative in the left inguinal area.  Genitourinary: Rectum normal, testes normal and penis normal. Rectal exam shows no external hemorrhoid, no internal hemorrhoid, no fissure, no mass, no tenderness and anal tone normal. Guaiac negative stool. Prostate is enlarged and tender (and boggy). Right testis shows no mass, no swelling and no tenderness. Right testis is descended. Left testis shows no mass, no swelling and no tenderness. Left testis is descended. Uncircumcised. No phimosis, paraphimosis, hypospadias, penile erythema or penile tenderness. No discharge found.  Musculoskeletal: Normal range of motion. He exhibits no edema and no tenderness.  Lymphadenopathy:    He has no cervical adenopathy.       Right: No inguinal adenopathy present.       Left: No inguinal adenopathy present.  Neurological: He is oriented to person, place, and time.  Skin: Skin is warm and dry. No rash noted. He is not diaphoretic. No erythema. No pallor.  Psychiatric: His behavior is normal. Judgment and thought content normal.     Lab Results  Component Value Date   WBC 7.7 09/14/2012   HGB 10.7* 09/14/2012   HCT 33.1* 09/14/2012   PLT 191 09/14/2012   GLUCOSE 164* 09/15/2012   CHOL 153 09/13/2012   TRIG 99 09/13/2012  HDL 25* 09/13/2012   LDLDIRECT 150.8 03/31/2012   LDLCALC 108* 09/13/2012   ALT 97* 09/15/2012   AST 48*  09/15/2012   NA 139 09/15/2012   K 3.7 09/15/2012   CL 109 09/15/2012   CREATININE 0.77 09/15/2012   BUN 7 09/15/2012   CO2 20 09/15/2012   TSH 2.192 09/14/2012   PSA 1.61 03/31/2012   INR 0.95 08/17/2010   HGBA1C 6.6* 09/14/2012       Assessment & Plan:

## 2012-10-01 NOTE — Assessment & Plan Note (Signed)
Check UA and ur clx, start cipro

## 2012-10-01 NOTE — Assessment & Plan Note (Signed)
Recent a1c looks great

## 2012-10-03 LAB — CULTURE, URINE COMPREHENSIVE: Colony Count: NO GROWTH

## 2012-10-03 NOTE — Assessment & Plan Note (Signed)
Urology referral

## 2012-10-05 ENCOUNTER — Encounter (INDEPENDENT_AMBULATORY_CARE_PROVIDER_SITE_OTHER): Payer: Medicare Other

## 2012-10-12 ENCOUNTER — Other Ambulatory Visit: Payer: Self-pay | Admitting: Internal Medicine

## 2012-10-26 ENCOUNTER — Encounter (INDEPENDENT_AMBULATORY_CARE_PROVIDER_SITE_OTHER): Payer: Medicare Other

## 2012-10-29 ENCOUNTER — Ambulatory Visit (INDEPENDENT_AMBULATORY_CARE_PROVIDER_SITE_OTHER): Payer: Medicare Other | Admitting: Internal Medicine

## 2012-10-29 ENCOUNTER — Encounter: Payer: Self-pay | Admitting: Internal Medicine

## 2012-10-29 VITALS — BP 120/62 | HR 73 | Temp 98.7°F | Resp 16 | Wt 246.0 lb

## 2012-10-29 DIAGNOSIS — D519 Vitamin B12 deficiency anemia, unspecified: Secondary | ICD-10-CM

## 2012-10-29 DIAGNOSIS — I10 Essential (primary) hypertension: Secondary | ICD-10-CM

## 2012-10-29 DIAGNOSIS — IMO0001 Reserved for inherently not codable concepts without codable children: Secondary | ICD-10-CM

## 2012-10-29 DIAGNOSIS — D518 Other vitamin B12 deficiency anemias: Secondary | ICD-10-CM

## 2012-10-29 MED ORDER — CYANOCOBALAMIN 1000 MCG/ML IJ SOLN
1000.0000 ug | Freq: Once | INTRAMUSCULAR | Status: AC
  Administered 2012-10-29: 1000 ug via INTRAMUSCULAR

## 2012-10-29 MED ORDER — CYANOCOBALAMIN 500 MCG/0.1ML NA SOLN: 0.1000 mL | NASAL | Status: DC

## 2012-10-29 NOTE — Progress Notes (Signed)
  Subjective:    Patient ID: [name], male    DOB: 12/09/1951, 61 y.o.   MRN: [id number]  Anemia Presents for follow-up visit. Symptoms include malaise/fatigue and paresthesias (numbness in his hands and feet). There has been no abdominal pain, anorexia, bruising/bleeding easily, confusion, fever, leg swelling, light-headedness, palpitations, pica or weight loss. Signs of blood loss that are not present include hematemesis, hematochezia and melena. Compliance problems include psychosocial issues.       Review of Systems  Constitutional: Positive for malaise/fatigue. Negative for fever, chills, weight loss, diaphoresis, activity change, appetite change, fatigue and unexpected weight change.  HENT: Negative.   Eyes: Negative.   Respiratory: Negative for cough, chest tightness, shortness of breath, wheezing and stridor.   Cardiovascular: Negative for chest pain, palpitations and leg swelling.  Gastrointestinal: Negative for nausea, abdominal pain, diarrhea, constipation, blood in stool, melena, hematochezia, anorexia and hematemesis.  Genitourinary: Negative.   Musculoskeletal: Negative.   Skin: Negative.   Neurological: Positive for paresthesias (numbness in his hands and feet). Negative for light-headedness.  Hematological: Negative for adenopathy. Does not bruise/bleed easily.  Psychiatric/Behavioral: Negative.  Negative for confusion.       Objective:   Physical Exam  Vitals reviewed. Constitutional: He is oriented to person, place, and time. He appears well-developed and well-nourished. No distress.  HENT:  Head: Normocephalic and atraumatic.  Mouth/Throat: Oropharynx is clear and moist. No oropharyngeal exudate.  Eyes: Conjunctivae normal are normal. Right eye exhibits no discharge. Left eye exhibits no discharge. No scleral icterus.  Neck: Normal range of motion. Neck supple. No JVD present. No tracheal deviation present. No thyromegaly present.  Cardiovascular: Normal  rate, regular rhythm, normal heart sounds and intact distal pulses.  Exam reveals no gallop and no friction rub.   No murmur heard. Pulmonary/Chest: Effort normal and breath sounds normal. No stridor. No respiratory distress. He has no wheezes. He has no rales. He exhibits no tenderness.  Abdominal: Soft. Bowel sounds are normal. He exhibits no distension and no mass. There is no tenderness. There is no rebound and no guarding.  Musculoskeletal: Normal range of motion. He exhibits no edema and no tenderness.  Lymphadenopathy:    He has no cervical adenopathy.  Neurological: He is oriented to person, place, and time.  Skin: Skin is warm and dry. No rash noted. He is not diaphoretic. No erythema. No pallor.  Psychiatric: He has a normal mood and affect. His behavior is normal. Judgment and thought content normal.     Lab Results  Component Value Date   WBC 7.7 09/14/2012   HGB 10.7* 09/14/2012   HCT 33.1* 09/14/2012   PLT 191 09/14/2012   GLUCOSE 164* 09/15/2012   CHOL 153 09/13/2012   TRIG 99 09/13/2012   HDL 25* 09/13/2012   LDLDIRECT 150.8 03/31/2012   LDLCALC 108* 09/13/2012   ALT 97* 09/15/2012   AST 48* 09/15/2012   NA 139 09/15/2012   K 3.7 09/15/2012   CL 109 09/15/2012   CREATININE 0.77 09/15/2012   BUN 7 09/15/2012   CO2 20 09/15/2012   TSH 2.192 09/14/2012   PSA 1.61 03/31/2012   INR 0.95 08/17/2010   HGBA1C 6.6* 09/14/2012       Assessment & Plan:

## 2012-10-29 NOTE — Assessment & Plan Note (Signed)
His BP is well controlled 

## 2012-10-29 NOTE — Assessment & Plan Note (Signed)
He needs B12 - he can get the injection here or do the ns

## 2012-10-29 NOTE — Patient Instructions (Signed)

## 2012-10-29 NOTE — Assessment & Plan Note (Signed)
His last a1c looks real good

## 2012-11-01 ENCOUNTER — Telehealth: Payer: Self-pay

## 2012-11-01 NOTE — Telephone Encounter (Signed)
Returned call to patient and advised that he may set up nurse visits to have b12 administered or we can call in RX for medication and needles to be done at home. Patient prefers to have done here in the office and will have wife to call back to set up nurse visit. I advised that he will have injection every 2 weeks for six weeks and then once monthly.

## 2012-11-01 NOTE — Telephone Encounter (Signed)
Received fax from [facility] stating that nacobal is not covered by insurance company $[id number].

## 2012-11-10 ENCOUNTER — Ambulatory Visit (INDEPENDENT_AMBULATORY_CARE_PROVIDER_SITE_OTHER): Payer: Medicare Other | Admitting: Internal Medicine

## 2012-11-10 ENCOUNTER — Encounter: Payer: Self-pay | Admitting: Internal Medicine

## 2012-11-10 VITALS — BP 114/72 | HR 76 | Temp 97.5°F | Resp 16 | Ht 70.0 in | Wt 239.2 lb

## 2012-11-10 DIAGNOSIS — D518 Other vitamin B12 deficiency anemias: Secondary | ICD-10-CM

## 2012-11-10 DIAGNOSIS — D519 Vitamin B12 deficiency anemia, unspecified: Secondary | ICD-10-CM

## 2012-11-10 DIAGNOSIS — E7800 Pure hypercholesterolemia, unspecified: Secondary | ICD-10-CM

## 2012-11-10 DIAGNOSIS — N410 Acute prostatitis: Secondary | ICD-10-CM

## 2012-11-10 DIAGNOSIS — I10 Essential (primary) hypertension: Secondary | ICD-10-CM

## 2012-11-10 MED ORDER — ATORVASTATIN CALCIUM 80 MG PO TABS: 80.0000 mg | ORAL_TABLET | Freq: Every day | ORAL | Status: DC

## 2012-11-10 MED ORDER — CYANOCOBALAMIN 1000 MCG/ML IJ SOLN
1000.0000 ug | Freq: Once | INTRAMUSCULAR | Status: AC
  Administered 2012-11-10: 1000 ug via INTRAMUSCULAR

## 2012-11-10 NOTE — Assessment & Plan Note (Signed)
Continue B12 monthly 

## 2012-11-10 NOTE — Progress Notes (Signed)
Subjective:    Patient ID: [name], male    DOB: 1951/12/13, 61 y.o.   MRN: [id number]  Hyperlipidemia This is a chronic problem. The current episode started more than 1 year ago. The problem is resistant. Recent lipid tests were reviewed and are high. Exacerbating diseases include obesity. He has no history of chronic renal disease, hypothyroidism, liver disease or nephrotic syndrome. Factors aggravating his hyperlipidemia include fatty foods and beta blockers. Pertinent negatives include no chest pain, focal sensory loss, focal weakness, leg pain, myalgias or shortness of breath. Current antihyperlipidemic treatment includes statins and bile acid squestrants. The current treatment provides moderate improvement of lipids. Compliance problems include adherence to exercise, adherence to diet and psychosocial issues.       Review of Systems  Constitutional: Positive for fatigue. Negative for fever, chills, diaphoresis, appetite change and unexpected weight change.  HENT: Negative.   Eyes: Negative.   Respiratory: Negative for cough, chest tightness, shortness of breath, wheezing and stridor.   Cardiovascular: Negative for chest pain, palpitations and leg swelling.  Gastrointestinal: Negative for nausea, vomiting, abdominal pain, diarrhea, constipation and blood in stool.  Genitourinary: Negative.   Musculoskeletal: Negative for myalgias, back pain, joint swelling, arthralgias and gait problem.  Skin: Negative for color change, pallor, rash and wound.  Neurological: Positive for numbness (in his feet). Negative for dizziness, tremors, focal weakness, seizures, syncope, facial asymmetry, weakness, light-headedness (in his feet) and headaches.  Hematological: Negative for adenopathy. Does not bruise/bleed easily.  Psychiatric/Behavioral: Negative.        Objective:   Physical Exam  Vitals reviewed. Constitutional: He is oriented to person, place, and time. He appears well-developed and  well-nourished. No distress.  HENT:  Head: Normocephalic and atraumatic.  Mouth/Throat: Oropharynx is clear and moist. No oropharyngeal exudate.  Eyes: Conjunctivae normal are normal. Right eye exhibits no discharge. Left eye exhibits no discharge. No scleral icterus.  Neck: Normal range of motion. Neck supple. No JVD present. No tracheal deviation present. No thyromegaly present.  Cardiovascular: Normal rate, regular rhythm, normal heart sounds and intact distal pulses.  Exam reveals no gallop and no friction rub.   No murmur heard. Pulmonary/Chest: Effort normal and breath sounds normal. No stridor. No respiratory distress. He has no wheezes. He has no rales. He exhibits no tenderness.  Abdominal: Soft. Bowel sounds are normal. He exhibits no distension and no mass. There is no tenderness. There is no rebound and no guarding.  Musculoskeletal: Normal range of motion. He exhibits no edema and no tenderness.  Lymphadenopathy:    He has no cervical adenopathy.  Neurological: He is oriented to person, place, and time.  Skin: Skin is warm and dry. No rash noted. He is not diaphoretic. No erythema. No pallor.  Psychiatric: He has a normal mood and affect. His behavior is normal. Judgment and thought content normal.      Lab Results  Component Value Date   WBC 7.7 09/14/2012   HGB 10.7* 09/14/2012   HCT 33.1* 09/14/2012   PLT 191 09/14/2012   GLUCOSE 164* 09/15/2012   CHOL 153 09/13/2012   TRIG 99 09/13/2012   HDL 25* 09/13/2012   LDLDIRECT 150.8 03/31/2012   LDLCALC 108* 09/13/2012   ALT 97* 09/15/2012   AST 48* 09/15/2012   NA 139 09/15/2012   K 3.7 09/15/2012   CL 109 09/15/2012   CREATININE 0.77 09/15/2012   BUN 7 09/15/2012   CO2 20 09/15/2012   TSH 2.192 09/14/2012   PSA 1.61  03/31/2012   INR 0.95 08/17/2010   HGBA1C 6.6* 09/14/2012      Assessment & Plan:

## 2012-11-10 NOTE — Assessment & Plan Note (Signed)
His BP is well controlled 

## 2012-11-10 NOTE — Assessment & Plan Note (Signed)
His LDL remains high so I have increased the dose of Lipitor, he will continue welchol as well

## 2012-11-10 NOTE — Assessment & Plan Note (Signed)
This has improved.

## 2012-11-25 DIAGNOSIS — H35371 Puckering of macula, right eye: Secondary | ICD-10-CM | POA: Insufficient documentation

## 2012-11-25 DIAGNOSIS — E11311 Type 2 diabetes mellitus with unspecified diabetic retinopathy with macular edema: Secondary | ICD-10-CM | POA: Insufficient documentation

## 2013-01-05 DIAGNOSIS — H409 Unspecified glaucoma: Secondary | ICD-10-CM | POA: Insufficient documentation

## 2013-01-05 DIAGNOSIS — Z8739 Personal history of other diseases of the musculoskeletal system and connective tissue: Secondary | ICD-10-CM | POA: Insufficient documentation

## 2013-01-05 DIAGNOSIS — H348312 Tributary (branch) retinal vein occlusion, right eye, stable: Secondary | ICD-10-CM | POA: Insufficient documentation

## 2013-01-05 DIAGNOSIS — H2512 Age-related nuclear cataract, left eye: Secondary | ICD-10-CM | POA: Insufficient documentation

## 2013-01-05 DIAGNOSIS — I10 Essential (primary) hypertension: Secondary | ICD-10-CM | POA: Insufficient documentation

## 2013-01-13 ENCOUNTER — Other Ambulatory Visit (HOSPITAL_COMMUNITY): Payer: Self-pay | Admitting: Internal Medicine

## 2013-02-13 ENCOUNTER — Other Ambulatory Visit: Payer: Self-pay | Admitting: Internal Medicine

## 2013-02-15 ENCOUNTER — Other Ambulatory Visit (HOSPITAL_COMMUNITY): Payer: Self-pay | Admitting: Internal Medicine

## 2013-03-28 ENCOUNTER — Other Ambulatory Visit (HOSPITAL_COMMUNITY): Payer: Self-pay | Admitting: Internal Medicine

## 2013-04-01 LAB — HM DIABETES EYE EXAM: HM Diabetic Eye Exam: NORMAL

## 2013-04-04 ENCOUNTER — Ambulatory Visit (INDEPENDENT_AMBULATORY_CARE_PROVIDER_SITE_OTHER)
Admission: RE | Admit: 2013-04-04 | Discharge: 2013-04-04 | Disposition: A | Discharge status: Home / Self Care | Payer: Medicare Other | Source: Ambulatory Visit | Attending: Internal Medicine | Admitting: Internal Medicine

## 2013-04-04 ENCOUNTER — Encounter: Payer: Self-pay | Admitting: Internal Medicine

## 2013-04-04 ENCOUNTER — Other Ambulatory Visit (INDEPENDENT_AMBULATORY_CARE_PROVIDER_SITE_OTHER): Payer: Medicare Other

## 2013-04-04 ENCOUNTER — Ambulatory Visit (INDEPENDENT_AMBULATORY_CARE_PROVIDER_SITE_OTHER): Payer: Medicare Other | Admitting: Internal Medicine

## 2013-04-04 VITALS — BP 118/72 | HR 79 | Temp 98.8°F | Resp 16 | Wt 246.0 lb

## 2013-04-04 DIAGNOSIS — I10 Essential (primary) hypertension: Secondary | ICD-10-CM

## 2013-04-04 DIAGNOSIS — M545 Low back pain, unspecified: Secondary | ICD-10-CM

## 2013-04-04 DIAGNOSIS — D518 Other vitamin B12 deficiency anemias: Secondary | ICD-10-CM

## 2013-04-04 DIAGNOSIS — D519 Vitamin B12 deficiency anemia, unspecified: Secondary | ICD-10-CM

## 2013-04-04 DIAGNOSIS — E7800 Pure hypercholesterolemia, unspecified: Secondary | ICD-10-CM

## 2013-04-04 DIAGNOSIS — IMO0001 Reserved for inherently not codable concepts without codable children: Secondary | ICD-10-CM

## 2013-04-04 LAB — CBC WITH DIFFERENTIAL/PLATELET
Basophils Absolute: 0 10*3/uL (ref 0.0–0.1)
Eosinophils Relative: 4.4 % (ref 0.0–5.0)
HCT: 37.8 % — ABNORMAL LOW (ref 39.0–52.0)
Lymphocytes Relative: 29.3 % (ref 12.0–46.0)
Monocytes Relative: 8.7 % (ref 3.0–12.0)
Neutrophils Relative %: 56.9 % (ref 43.0–77.0)
Platelets: 204 10*3/uL (ref 150.0–400.0)
RDW: 13.8 % (ref 11.5–14.6)
WBC: 3.9 10*3/uL — ABNORMAL LOW (ref 4.5–10.5)

## 2013-04-04 LAB — LIPID PANEL
Cholesterol: 156 mg/dL (ref 0–200)
HDL: 27.3 mg/dL — ABNORMAL LOW (ref >39.00)
Triglycerides: 234 mg/dL — ABNORMAL HIGH (ref 0.0–149.0)
VLDL: 46.8 mg/dL — ABNORMAL HIGH (ref 0.0–40.0)

## 2013-04-04 LAB — COMPREHENSIVE METABOLIC PANEL
ALT: 14 U/L (ref 0–53)
Albumin: 3.8 g/dL (ref 3.5–5.2)
CO2: 21 mEq/L (ref 19–32)
Calcium: 9 mg/dL (ref 8.4–10.5)
Chloride: 106 mEq/L (ref 96–112)
GFR: 89.59 mL/min (ref >60.00)
Glucose, Bld: 129 mg/dL — ABNORMAL HIGH (ref 70–99)
Sodium: 138 mEq/L (ref 135–145)
Total Bilirubin: 0.3 mg/dL (ref 0.3–1.2)
Total Protein: 7.1 g/dL (ref 6.0–8.3)

## 2013-04-04 LAB — HEMOGLOBIN A1C: Hgb A1c MFr Bld: 7.4 % — ABNORMAL HIGH (ref 4.6–6.5)

## 2013-04-04 MED ORDER — LINAGLIPTIN-METFORMIN HCL 2.5-850 MG PO TABS
1.0000 | ORAL_TABLET | Freq: Two times a day (BID) | ORAL | Status: DC
Start: 2013-04-04 — End: 2014-05-11

## 2013-04-04 MED ORDER — COLESEVELAM HCL 3.75 G PO PACK: 1.0000 | PACK | Freq: Every day | ORAL | Status: DC

## 2013-04-04 MED ORDER — OXYCODONE HCL 5 MG PO TABS: 5.0000 mg | ORAL_TABLET | ORAL | Status: DC | PRN

## 2013-04-04 NOTE — Assessment & Plan Note (Signed)
His BP is well controlled Today I will check his lytes and renal function 

## 2013-04-04 NOTE — Assessment & Plan Note (Signed)
He has not been able to afford [place] and metformin so I gave him samples of jentadueto, he will also continue humalog Today I will recheck his A1C and will monitor his renal function

## 2013-04-04 NOTE — Progress Notes (Signed)
Subjective:    Patient ID: [name], male    DOB: [birth year], 61 y.o.   MRN: [id number]  Diabetes He presents for his follow-up diabetic visit. He has type 2 diabetes mellitus. His disease course has been worsening. There are no hypoglycemic associated symptoms. Pertinent negatives for hypoglycemia include no dizziness or pallor. Associated symptoms include polydipsia, polyphagia and polyuria. Pertinent negatives for diabetes include no blurred vision, no chest pain, no fatigue, no foot paresthesias, no foot ulcerations, no visual change, no weakness and no weight loss. There are no hypoglycemic complications. Current diabetic treatment includes oral agent (dual therapy), intensive insulin program and insulin injections. He is compliant with treatment some of the time. His weight is stable. He is following a generally unhealthy diet. When asked about meal planning, he reported none. He has not had a previous visit with a dietician. He never participates in exercise. His home blood glucose trend is increasing steadily. His breakfast blood glucose range is generally 140-180 mg/dl. His lunch blood glucose range is generally 180-200 mg/dl. His dinner blood glucose range is generally 180-200 mg/dl. His highest blood glucose is >200 mg/dl. His overall blood glucose range is 180-200 mg/dl. An ACE inhibitor/angiotensin II receptor blocker is being taken. He does not see a podiatrist.Eye exam is current.      Review of Systems  Constitutional: Positive for unexpected weight change (some weight gain). Negative for fever, chills, weight loss, diaphoresis, activity change, appetite change and fatigue.  HENT: Negative.   Eyes: Negative.  Negative for blurred vision.  Respiratory: Negative.  Negative for apnea, cough, choking, chest tightness, shortness of breath, wheezing and stridor.   Cardiovascular: Negative.  Negative for chest pain, palpitations and leg swelling.  Gastrointestinal: Negative.  Negative  for nausea, vomiting, abdominal pain, diarrhea, constipation and blood in stool.  Endocrine: Positive for polydipsia, polyphagia and polyuria.  Musculoskeletal: Positive for back pain (chronic, aching, non-radiating low back pain). Negative for myalgias, joint swelling, arthralgias and gait problem.  Skin: Negative.  Negative for color change, pallor, rash and wound.  Allergic/Immunologic: Negative.   Neurological: Negative.  Negative for dizziness, weakness, light-headedness and numbness.  Hematological: Negative.  Negative for adenopathy. Does not bruise/bleed easily.  Psychiatric/Behavioral: Negative.        Objective:   Physical Exam  Vitals reviewed. Constitutional: He is oriented to person, place, and time. He appears well-developed and well-nourished. No distress.  HENT:  Head: Normocephalic and atraumatic.  Mouth/Throat: Oropharynx is clear and moist. No oropharyngeal exudate.  Eyes: Conjunctivae are normal. Right eye exhibits no discharge. Left eye exhibits no discharge. No scleral icterus.  Neck: Normal range of motion. Neck supple. No JVD present. No tracheal deviation present. No thyromegaly present.  Cardiovascular: Normal rate, regular rhythm, normal heart sounds and intact distal pulses.  Exam reveals no gallop and no friction rub.   No murmur heard. Pulmonary/Chest: Effort normal and breath sounds normal. No stridor. No respiratory distress. He has no wheezes. He has no rales. He exhibits no tenderness.  Abdominal: Soft. Bowel sounds are normal. He exhibits no distension and no mass. There is no tenderness. There is no rebound and no guarding.  Musculoskeletal: Normal range of motion. He exhibits edema (1+ pitting edema in BLE). He exhibits no tenderness.  Lymphadenopathy:    He has no cervical adenopathy.  Neurological: He is oriented to person, place, and time.  Skin: Skin is warm and dry. No rash noted. He is not diaphoretic. No erythema. No pallor.  Psychiatric:  He  has a normal mood and affect. His behavior is normal. Judgment and thought content normal.     Lab Results  Component Value Date   WBC 7.7 09/14/2012   HGB 10.7* 09/14/2012   HCT 33.1* 09/14/2012   PLT 191 09/14/2012   GLUCOSE 164* 09/15/2012   CHOL 153 09/13/2012   TRIG 99 09/13/2012   HDL 25* 09/13/2012   LDLDIRECT 150.8 03/31/2012   LDLCALC 108* 09/13/2012   ALT 97* 09/15/2012   AST 48* 09/15/2012   NA 139 09/15/2012   K 3.7 09/15/2012   CL 109 09/15/2012   CREATININE 0.77 09/15/2012   BUN 7 09/15/2012   CO2 20 09/15/2012   TSH 2.192 09/14/2012   PSA 1.61 03/31/2012   INR 0.95 08/17/2010   HGBA1C 6.6* 09/14/2012       Assessment & Plan:

## 2013-04-04 NOTE — Assessment & Plan Note (Signed)
He will continue oxy as needed for pain

## 2013-04-04 NOTE — Patient Instructions (Signed)

## 2013-04-04 NOTE — Assessment & Plan Note (Signed)
I will check his CBC and B12 level today 

## 2013-04-04 NOTE — Assessment & Plan Note (Signed)
FLp today 

## 2013-04-05 ENCOUNTER — Encounter: Payer: Self-pay | Admitting: Internal Medicine

## 2013-04-06 ENCOUNTER — Other Ambulatory Visit: Payer: Self-pay | Admitting: Internal Medicine

## 2013-05-25 ENCOUNTER — Other Ambulatory Visit: Payer: Self-pay | Admitting: Internal Medicine

## 2013-06-10 ENCOUNTER — Telehealth: Payer: Self-pay | Admitting: Internal Medicine

## 2013-06-10 MED ORDER — INSULIN LISPRO PROT & LISPRO (75-25 MIX) 100 UNIT/ML ~~LOC~~ SUSP: SUBCUTANEOUS | Status: DC

## 2013-06-10 NOTE — Telephone Encounter (Signed)
Requesting samples of humalog Call her at [phone]

## 2013-06-10 NOTE — Telephone Encounter (Signed)
Patient wife notified to pick up samples on humalog.

## 2013-07-12 ENCOUNTER — Telehealth: Payer: Self-pay | Admitting: *Deleted

## 2013-07-12 NOTE — Telephone Encounter (Signed)
Pt called requesting Humalog Insulin samples.  Samples ready for pick up, pt aware.

## 2013-08-04 ENCOUNTER — Ambulatory Visit (INDEPENDENT_AMBULATORY_CARE_PROVIDER_SITE_OTHER): Payer: Medicare Other | Admitting: Internal Medicine

## 2013-08-04 ENCOUNTER — Other Ambulatory Visit (INDEPENDENT_AMBULATORY_CARE_PROVIDER_SITE_OTHER): Payer: Medicare Other

## 2013-08-04 ENCOUNTER — Encounter: Payer: Self-pay | Admitting: Internal Medicine

## 2013-08-04 VITALS — BP 110/64 | HR 83 | Temp 98.8°F | Resp 16 | Wt 241.0 lb

## 2013-08-04 DIAGNOSIS — I2510 Atherosclerotic heart disease of native coronary artery without angina pectoris: Secondary | ICD-10-CM

## 2013-08-04 DIAGNOSIS — R079 Chest pain, unspecified: Secondary | ICD-10-CM

## 2013-08-04 DIAGNOSIS — D519 Vitamin B12 deficiency anemia, unspecified: Secondary | ICD-10-CM

## 2013-08-04 DIAGNOSIS — IMO0001 Reserved for inherently not codable concepts without codable children: Secondary | ICD-10-CM

## 2013-08-04 DIAGNOSIS — I10 Essential (primary) hypertension: Secondary | ICD-10-CM

## 2013-08-04 DIAGNOSIS — D518 Other vitamin B12 deficiency anemias: Secondary | ICD-10-CM

## 2013-08-04 DIAGNOSIS — M545 Low back pain, unspecified: Secondary | ICD-10-CM

## 2013-08-04 LAB — CBC WITH DIFFERENTIAL/PLATELET
Basophils Relative: 0.5 % (ref 0.0–3.0)
Eosinophils Relative: 2.7 % (ref 0.0–5.0)
HCT: 37.8 % — ABNORMAL LOW (ref 39.0–52.0)
Hemoglobin: 12.1 g/dL — ABNORMAL LOW (ref 13.0–17.0)
Lymphs Abs: 1.3 10*3/uL (ref 0.7–4.0)
Monocytes Relative: 6.4 % (ref 3.0–12.0)
Neutro Abs: 4 10*3/uL (ref 1.4–7.7)
RDW: 15.1 % — ABNORMAL HIGH (ref 11.5–14.6)
WBC: 5.9 10*3/uL (ref 4.5–10.5)

## 2013-08-04 LAB — CARDIAC PANEL
CK-MB: 1.3 ng/mL (ref 0.3–4.0)
Relative Index: 1.2 calc (ref 0.0–2.5)

## 2013-08-04 LAB — BASIC METABOLIC PANEL
CO2: 23 mEq/L (ref 19–32)
Calcium: 9 mg/dL (ref 8.4–10.5)
Glucose, Bld: 108 mg/dL — ABNORMAL HIGH (ref 70–99)
Sodium: 137 mEq/L (ref 135–145)

## 2013-08-04 LAB — FOLATE: Folate: 13.6 ng/mL (ref >5.9)

## 2013-08-04 MED ORDER — OXYCODONE HCL 5 MG PO TABS: 5.0000 mg | ORAL_TABLET | ORAL | Status: DC | PRN

## 2013-08-04 NOTE — Patient Instructions (Signed)
Chest Pain (Nonspecific) °It is often hard to give a specific diagnosis for the cause of chest pain. There is always a chance that your pain could be related to something serious, such as a heart attack or a blood clot in the lungs. You need to follow up with your caregiver for further evaluation. °CAUSES  °· Heartburn. °· Pneumonia or bronchitis. °· Anxiety or stress. °· Inflammation around your heart (pericarditis) or lung (pleuritis or pleurisy). °· A blood clot in the lung. °· A collapsed lung (pneumothorax). It can develop suddenly on its own (spontaneous pneumothorax) or from injury (trauma) to the chest. °· Shingles infection (herpes zoster virus). °The chest wall is composed of bones, muscles, and cartilage. Any of these can be the source of the pain. °· The bones can be bruised by injury. °· The muscles or cartilage can be strained by coughing or overwork. °· The cartilage can be affected by inflammation and become sore (costochondritis). °DIAGNOSIS  °Lab tests or other studies, such as X-rays, electrocardiography, stress testing, or cardiac imaging, may be needed to find the cause of your pain.  °TREATMENT  °· Treatment depends on what may be causing your chest pain. Treatment may include: °· Acid blockers for heartburn. °· Anti-inflammatory medicine. °· Pain medicine for inflammatory conditions. °· Antibiotics if an infection is present. °· You may be advised to change lifestyle habits. This includes stopping smoking and avoiding alcohol, caffeine, and chocolate. °· You may be advised to keep your head raised (elevated) when sleeping. This reduces the chance of acid going backward from your stomach into your esophagus. °· Most of the time, nonspecific chest pain will improve within 2 to 3 days with rest and mild pain medicine. °HOME CARE INSTRUCTIONS  °· If antibiotics were prescribed, take your antibiotics as directed. Finish them even if you start to feel better. °· For the next few days, avoid physical  activities that bring on chest pain. Continue physical activities as directed. °· Do not smoke. °· Avoid drinking alcohol. °· Only take over-the-counter or prescription medicine for pain, discomfort, or fever as directed by your caregiver. °· Follow your caregiver's suggestions for further testing if your chest pain does not go away. °· Keep any follow-up appointments you made. If you do not go to an appointment, you could develop lasting (chronic) problems with pain. If there is any problem keeping an appointment, you must call to reschedule. °SEEK MEDICAL CARE IF:  °· You think you are having problems from the medicine you are taking. Read your medicine instructions carefully. °· Your chest pain does not go away, even after treatment. °· You develop a rash with blisters on your chest. °SEEK IMMEDIATE MEDICAL CARE IF:  °· You have increased chest pain or pain that spreads to your arm, neck, jaw, back, or abdomen. °· You develop shortness of breath, an increasing cough, or you are coughing up blood. °· You have severe back or abdominal pain, feel nauseous, or vomit. °· You develop severe weakness, fainting, or chills. °· You have a fever. °THIS IS AN EMERGENCY. Do not wait to see if the pain will go away. Get medical help at once. Call your local emergency services (911 in [place] Do not drive yourself to the hospital. °MAKE SURE YOU:  °· Understand these instructions. °· Will watch your condition. °· Will get help right away if you are not doing well or get worse. °Document Released: 09/17/2005 Document Revised: 03/01/2012 Document Reviewed: 07/13/2008 °[employer]® Patient Information ©2014 [employer]   [employer]. ° °

## 2013-08-04 NOTE — Assessment & Plan Note (Signed)
The pain is somewhat typical for angina and somewhat atypical His EKG is unremarkable and unchanged I will check his cardiac enzymes today and have asked him to get a myocardial perfusion image done

## 2013-08-04 NOTE — Progress Notes (Signed)
Subjective:    Patient ID: [name], male    DOB: February 25, 1951, 62 y.o.   MRN: [id number]  Chest Pain  This is a new problem. The current episode started in the past 7 days. The onset quality is gradual. The problem occurs intermittently. The problem has been unchanged. The pain is present in the substernal region. The pain is at a severity of 2/10. The pain is mild. The quality of the pain is described as burning and sharp. The pain does not radiate. Associated symptoms include exertional chest pressure, lower extremity edema and malaise/fatigue. Pertinent negatives include no abdominal pain, back pain, claudication, cough, diaphoresis, dizziness, fever, headaches, hemoptysis, irregular heartbeat, leg pain, nausea, near-syncope, numbness, orthopnea, palpitations, PND, shortness of breath, sputum production, syncope, vomiting or weakness. The pain is aggravated by exertion. He has tried nothing for the symptoms. Risk factors include male gender, obesity, lack of exercise and being elderly. Prior diagnostic workup includes cardiac catherization.      Review of Systems  Constitutional: Positive for malaise/fatigue and fatigue. Negative for fever, chills, diaphoresis, activity change, appetite change and unexpected weight change.  HENT: Negative.   Eyes: Negative.   Respiratory: Negative.  Negative for cough, hemoptysis, sputum production, choking, chest tightness, shortness of breath, wheezing and stridor.   Cardiovascular: Positive for chest pain. Negative for palpitations, orthopnea, claudication, leg swelling, syncope, PND and near-syncope.  Gastrointestinal: Negative.  Negative for nausea, vomiting, abdominal pain, diarrhea and constipation.  Endocrine: Negative.  Negative for polydipsia, polyphagia and polyuria.  Genitourinary: Negative.   Musculoskeletal: Negative.  Negative for myalgias, back pain, joint swelling, arthralgias and gait problem.  Skin: Negative.   Allergic/Immunologic:  Negative.   Neurological: Negative.  Negative for dizziness, weakness, numbness and headaches.  Hematological: Negative.  Negative for adenopathy. Does not bruise/bleed easily.  Psychiatric/Behavioral: Negative.        Objective:   Physical Exam  Vitals reviewed. Constitutional: He is oriented to person, place, and time. He appears well-developed and well-nourished. No distress.  HENT:  Head: Normocephalic and atraumatic.  Mouth/Throat: Oropharynx is clear and moist. No oropharyngeal exudate.  Eyes: Conjunctivae are normal. Right eye exhibits no discharge. Left eye exhibits no discharge. No scleral icterus.  Neck: Normal range of motion. Neck supple. No JVD present. No tracheal deviation present. No thyromegaly present.  Cardiovascular: Normal rate, regular rhythm, normal heart sounds and intact distal pulses.  Exam reveals no gallop and no friction rub.   No murmur heard. Pulmonary/Chest: Effort normal and breath sounds normal. No stridor. No respiratory distress. He has no wheezes. He has no rales. He exhibits no tenderness.  Abdominal: Soft. Bowel sounds are normal. He exhibits no distension and no mass. There is no tenderness. There is no rebound and no guarding.  Musculoskeletal: Normal range of motion. He exhibits edema (trace edema in BLE). He exhibits no tenderness.  Lymphadenopathy:    He has no cervical adenopathy.  Neurological: He is oriented to person, place, and time.  Skin: Skin is warm and dry. No rash noted. He is not diaphoretic. No erythema. No pallor.  Psychiatric: He has a normal mood and affect. His behavior is normal. Judgment and thought content normal.     Lab Results  Component Value Date   WBC 3.9* 04/04/2013   HGB 12.0* 04/04/2013   HCT 37.8* 04/04/2013   PLT 204.0 04/04/2013   GLUCOSE 129* 04/04/2013   CHOL 156 04/04/2013   TRIG 234.0* 04/04/2013   HDL 27.30* 04/04/2013   LDLDIRECT  82.7 04/04/2013   LDLCALC 108* 09/13/2012   ALT 14 04/04/2013   AST 18  04/04/2013   NA 138 04/04/2013   K 4.3 04/04/2013   CL 106 04/04/2013   CREATININE 1.1 04/04/2013   BUN 17 04/04/2013   CO2 21 04/04/2013   TSH 1.23 04/04/2013   PSA 1.61 03/31/2012   INR 0.95 08/17/2010   HGBA1C 7.4* 04/04/2013       Assessment & Plan:

## 2013-08-04 NOTE — Assessment & Plan Note (Signed)
I will recheck his CBC and B12 level today

## 2013-08-04 NOTE — Assessment & Plan Note (Signed)
I will check his A1C and will address if needed 

## 2013-08-04 NOTE — Assessment & Plan Note (Signed)
His BP is well controlled 

## 2013-08-04 NOTE — Assessment & Plan Note (Signed)
I will check his cardiac enzymes today and have asked him to get an MPI done

## 2013-08-05 ENCOUNTER — Telehealth: Payer: Self-pay | Admitting: *Deleted

## 2013-08-05 NOTE — Telephone Encounter (Signed)
[name] Fax: [phone] From: [facility] Date/ Time: 08/04/2013 6:50 PM Taken By: Gerri Spore, CSR Caller: Zella Ball Facility: [facility] Patient: [name], [name] DOB: 01/03/1951 Phone: [phone] Reason for Call: Zella Ball is calling from [facility] regarding a triponin ordered on [name], [name] by Dr Maisie Fus Jones08/14/2014 15:432pm. The results were NORMAL. Regarding Appointment: Appt Date: Appt Time: Unknown Provider: Reason: Details:

## 2013-08-23 ENCOUNTER — Encounter (HOSPITAL_COMMUNITY): Payer: Medicare Other

## 2013-08-23 ENCOUNTER — Ambulatory Visit (HOSPITAL_COMMUNITY): Payer: Medicare Other | Attending: Cardiovascular Disease | Admitting: Radiology

## 2013-08-23 VITALS — BP 118/65 | Ht 70.0 in | Wt 237.0 lb

## 2013-08-23 DIAGNOSIS — E785 Hyperlipidemia, unspecified: Secondary | ICD-10-CM | POA: Insufficient documentation

## 2013-08-23 DIAGNOSIS — I2510 Atherosclerotic heart disease of native coronary artery without angina pectoris: Secondary | ICD-10-CM

## 2013-08-23 DIAGNOSIS — R5381 Other malaise: Secondary | ICD-10-CM | POA: Insufficient documentation

## 2013-08-23 DIAGNOSIS — Z794 Long term (current) use of insulin: Secondary | ICD-10-CM | POA: Insufficient documentation

## 2013-08-23 DIAGNOSIS — R079 Chest pain, unspecified: Secondary | ICD-10-CM

## 2013-08-23 DIAGNOSIS — R0789 Other chest pain: Secondary | ICD-10-CM | POA: Insufficient documentation

## 2013-08-23 DIAGNOSIS — I10 Essential (primary) hypertension: Secondary | ICD-10-CM | POA: Insufficient documentation

## 2013-08-23 DIAGNOSIS — Z9861 Coronary angioplasty status: Secondary | ICD-10-CM | POA: Insufficient documentation

## 2013-08-23 DIAGNOSIS — E119 Type 2 diabetes mellitus without complications: Secondary | ICD-10-CM | POA: Insufficient documentation

## 2013-08-23 MED ORDER — TECHNETIUM TC 99M SESTAMIBI GENERIC - CARDIOLITE
10.0000 | Freq: Once | INTRAVENOUS | Status: AC | PRN
  Administered 2013-08-23: 10 via INTRAVENOUS

## 2013-08-23 MED ORDER — TECHNETIUM TC 99M SESTAMIBI GENERIC - CARDIOLITE
30.0000 | Freq: Once | INTRAVENOUS | Status: AC | PRN
  Administered 2013-08-23: 30 via INTRAVENOUS

## 2013-08-23 MED ORDER — REGADENOSON 0.4 MG/5ML IV SOLN
0.4000 mg | Freq: Once | INTRAVENOUS | Status: AC
  Administered 2013-08-23: 0.4 mg via INTRAVENOUS

## 2013-08-23 NOTE — Progress Notes (Signed)
[facility] SITE 3 NUCLEAR MED [address] [place], [place] [address] [phone]    Cardiology Nuclear Med Study  [place] [name] is a 62 y.o. male     MRN : [id number]     DOB: [birth year]  Procedure Date: 08/23/2013  Nuclear Med Background Indication for Stress Test:  Evaluation for Ischemia and PTCA/Stent Patency History:  '08 Heart Cath-CFX-95% Stent, LAD-80% TX, RCA-Stent 95%, EF: 65%/Angioplasty Cardiac Risk Factors: Hypertension, IDDM Type 2 and Lipids  Symptoms:  Chest Pain, Chest Pressure with Exertion and Fatigue   Nuclear Pre-Procedure Caffeine/Decaff Intake:  None > 12 hrs NPO After: 11:00pm   Lungs:  clear O2 Sat: 96% on room air. IV 0.9% NS with Angio Cath:  22g  IV Site: L Antecubital x 1, tolerated well IV Started by:  Irean Hong, RN  Chest Size (in):  50 Cup Size: n/a  Height: 5\' 10"  (1.778 m)  Weight:  237 lb (107.502 kg)  BMI:  Body mass index is 34.01 kg/(m^2). Tech Comments:  Took Coreg this am. Full dose insulin last night; no insulin today. Fasting CBG on arrival was 82 at 0945. Irean Hong, RN    Nuclear Med Study 1 or 2 day study: 1 day  Stress Test Type:  Eugenie Birks  Reading MD: Kristeen Miss, MD  Order Authorizing Provider:  Sanda Linger, MD  Resting Radionuclide: Technetium 54m Sestamibi  Resting Radionuclide Dose: 11.0 mCi   Stress Radionuclide:  Technetium 65m Sestamibi  Stress Radionuclide Dose: 33.0 mCi           Stress Protocol Rest HR: 68 Stress HR: 87  Rest BP: 118/65 Stress BP: 124/60  Exercise Time (min): n/a METS: n/a   Predicted Max HR: 156 bpm % Max HR: 55.77 bpm Rate Pressure Product: 46962   Dose of Adenosine (mg):  n/a Dose of Lexiscan: 0.4 mg  Dose of Atropine (mg): n/a Dose of Dobutamine: n/a mcg/kg/min (at max HR)  Stress Test Technologist: Milana Na, EMT-P  Nuclear Technologist:  Harlow Asa, CNMT     Rest Procedure:  Myocardial perfusion imaging was performed at rest 45 minutes following the  intravenous administration of Technetium 28m Sestamibi. Rest ECG: NSR - Normal EKG  Stress Procedure:  The patient received IV Lexiscan 0.4 mg over 15-seconds.  Technetium 65m Sestamibi injected at 30-seconds.  This patient was lt. Headed and had abdominal pain with the Lexiscan injection. Quantitative spect images were obtained after a 45 minute delay. Stress ECG: No significant change from baseline ECG  QPS Raw Data Images:  Normal; no motion artifact; normal heart/lung ratio. Stress Images:  Normal homogeneous uptake in all areas of the myocardium. Rest Images:  Normal homogeneous uptake in all areas of the myocardium. Subtraction (SDS):  No evidence of ischemia. Transient Ischemic Dilatation (Normal <1.22):  n/a Lung/Heart Ratio (Normal <0.45):  0.47  Quantitative Gated Spect Images QGS EDV:  78 ml QGS ESV:  29 ml  Impression Exercise Capacity:  Lexiscan with no exercise. BP Response:  Normal blood pressure response. Clinical Symptoms:  No significant symptoms noted. ECG Impression:  No significant ST segment change suggestive of ischemia. Comparison with Prior Nuclear Study: No images to compare  Overall Impression:  Normal stress nuclear study.   There is no evidence of ischemia.   LV Ejection Fraction: 63%.  LV Wall Motion:  NL LV Function; NL Wall Motion.   Vesta Mixer, Montez Hageman., MD, [facility] 08/23/2013, 4:56 PM Office - [phone] Pager [phone]

## 2013-08-25 ENCOUNTER — Encounter: Payer: Self-pay | Admitting: Internal Medicine

## 2013-08-25 ENCOUNTER — Ambulatory Visit (INDEPENDENT_AMBULATORY_CARE_PROVIDER_SITE_OTHER): Payer: Medicare Other | Admitting: Internal Medicine

## 2013-08-25 VITALS — BP 116/58 | HR 67 | Temp 98.1°F | Resp 16

## 2013-08-25 DIAGNOSIS — I10 Essential (primary) hypertension: Secondary | ICD-10-CM

## 2013-08-25 DIAGNOSIS — K219 Gastro-esophageal reflux disease without esophagitis: Secondary | ICD-10-CM | POA: Insufficient documentation

## 2013-08-25 DIAGNOSIS — E781 Pure hyperglyceridemia: Secondary | ICD-10-CM | POA: Insufficient documentation

## 2013-08-25 MED ORDER — ICOSAPENT ETHYL 1 G PO CAPS: 2.0000 | ORAL_CAPSULE | Freq: Two times a day (BID) | ORAL | Status: DC

## 2013-08-25 MED ORDER — RANITIDINE HCL 150 MG PO TABS: 150.0000 mg | ORAL_TABLET | Freq: Two times a day (BID) | ORAL | Status: DC

## 2013-08-25 NOTE — Assessment & Plan Note (Signed)
He will restart zantac

## 2013-08-25 NOTE — Patient Instructions (Signed)
Gastroesophageal Reflux Disease, Adult  Gastroesophageal reflux disease (GERD) happens when acid from your stomach flows up into the esophagus. When acid comes in contact with the esophagus, the acid causes soreness (inflammation) in the esophagus. Over time, GERD may create small holes (ulcers) in the lining of the esophagus.  CAUSES   · Increased body weight. This puts pressure on the stomach, making acid rise from the stomach into the esophagus.  · Smoking. This increases acid production in the stomach.  · Drinking alcohol. This causes decreased pressure in the lower esophageal sphincter (valve or ring of muscle between the esophagus and stomach), allowing acid from the stomach into the esophagus.  · Late evening meals and a full stomach. This increases pressure and acid production in the stomach.  · A malformed lower esophageal sphincter.  Sometimes, no cause is found.  SYMPTOMS   · Burning pain in the lower part of the mid-chest behind the breastbone and in the mid-stomach area. This may occur twice a week or more often.  · Trouble swallowing.  · Sore throat.  · Dry cough.  · Asthma-like symptoms including chest tightness, shortness of breath, or wheezing.  DIAGNOSIS   Your caregiver may be able to diagnose GERD based on your symptoms. In some cases, X-rays and other tests may be done to check for complications or to check the condition of your stomach and esophagus.  TREATMENT   Your caregiver may recommend over-the-counter or prescription medicines to help decrease acid production. Ask your caregiver before starting or adding any new medicines.   HOME CARE INSTRUCTIONS   · Change the factors that you can control. Ask your caregiver for guidance concerning weight loss, quitting smoking, and alcohol consumption.  · Avoid foods and drinks that make your symptoms worse, such as:  · Caffeine or alcoholic drinks.  · Chocolate.  · Peppermint or mint flavorings.  · Garlic and onions.  · Spicy foods.  · Citrus fruits,  such as oranges, lemons, or limes.  · Tomato-based foods such as sauce, chili, salsa, and pizza.  · Fried and fatty foods.  · Avoid lying down for the 3 hours prior to your bedtime or prior to taking a nap.  · Eat small, frequent meals instead of large meals.  · Wear loose-fitting clothing. Do not wear anything tight around your waist that causes pressure on your stomach.  · Raise the head of your bed 6 to 8 inches with wood blocks to help you sleep. Extra pillows will not help.  · Only take over-the-counter or prescription medicines for pain, discomfort, or fever as directed by your caregiver.  · Do not take aspirin, ibuprofen, or other nonsteroidal anti-inflammatory drugs (NSAIDs).  SEEK IMMEDIATE MEDICAL CARE IF:   · You have pain in your arms, neck, jaw, teeth, or back.  · Your pain increases or changes in intensity or duration.  · You develop nausea, vomiting, or sweating (diaphoresis).  · You develop shortness of breath, or you faint.  · Your vomit is green, yellow, black, or looks like coffee grounds or blood.  · Your stool is red, bloody, or black.  These symptoms could be signs of other problems, such as heart disease, gastric bleeding, or esophageal bleeding.  MAKE SURE YOU:   · Understand these instructions.  · Will watch your condition.  · Will get help right away if you are not doing well or get worse.  Document Released: 09/17/2005 Document Revised: 03/01/2012 Document Reviewed: 06/27/2011  Exit[employer]® Patient   Information ©2014 [employer].

## 2013-08-25 NOTE — Assessment & Plan Note (Signed)
He has not been able to pay for lovaza Will try vascepa

## 2013-08-25 NOTE — Progress Notes (Signed)
Subjective:    Patient ID: [name], male    DOB: June 24, 1951, 62 y.o.   MRN: [id number]  Gastrophageal Reflux He complains of heartburn. He reports no abdominal pain, no belching, no chest pain, no choking, no coughing, no dysphagia, no early satiety, no globus sensation, no hoarse voice, no nausea, no sore throat, no stridor, no tooth decay, no water brash or no wheezing. This is a chronic problem. The current episode started more than 1 year ago. The problem occurs occasionally. The problem has been unchanged. The heartburn duration is less than a minute. The heartburn is located in the substernum. The heartburn is of mild intensity. The heartburn does not wake him from sleep. The heartburn does not limit his activity. The heartburn doesn't change with position. Associated symptoms include anemia and fatigue. Pertinent negatives include no melena, muscle weakness, orthopnea or weight loss. Risk factors include obesity and lack of exercise. He has tried nothing (he has not taken anything recently, he ran out of zantac) for the symptoms.      Review of Systems  Constitutional: Positive for fatigue. Negative for fever, chills, weight loss, diaphoresis, activity change, appetite change and unexpected weight change.  HENT: Negative.  Negative for sore throat, hoarse voice, trouble swallowing and voice change.   Eyes: Negative.   Respiratory: Negative.  Negative for cough, choking, chest tightness, shortness of breath, wheezing and stridor.   Cardiovascular: Negative.  Negative for chest pain, palpitations and leg swelling.  Gastrointestinal: Positive for heartburn. Negative for dysphagia, nausea, vomiting, abdominal pain, diarrhea, constipation, blood in stool, melena, abdominal distention, anal bleeding and rectal pain.  Endocrine: Negative.   Genitourinary: Negative.   Musculoskeletal: Negative.  Negative for muscle weakness.  Skin: Negative.   Allergic/Immunologic: Negative.    Neurological: Negative.  Negative for dizziness, tremors, seizures, syncope, weakness, light-headedness and headaches.  Hematological: Negative.  Negative for adenopathy. Does not bruise/bleed easily.  Psychiatric/Behavioral: Negative.        Objective:   Physical Exam  Vitals reviewed. Constitutional: He is oriented to person, place, and time. He appears well-developed and well-nourished. No distress.  HENT:  Head: Normocephalic and atraumatic.  Mouth/Throat: Oropharynx is clear and moist. No oropharyngeal exudate.  Eyes: Conjunctivae are normal. Right eye exhibits no discharge. Left eye exhibits no discharge. No scleral icterus.  Neck: Normal range of motion. Neck supple. No JVD present. No tracheal deviation present. No thyromegaly present.  Cardiovascular: Normal rate, regular rhythm, normal heart sounds and intact distal pulses.  Exam reveals no gallop and no friction rub.   No murmur heard. Pulmonary/Chest: Effort normal and breath sounds normal. No stridor. No respiratory distress. He has no wheezes. He has no rales. He exhibits no tenderness.  Abdominal: Soft. Bowel sounds are normal. He exhibits no distension and no mass. There is no tenderness. There is no rebound and no guarding.  Musculoskeletal: Normal range of motion. He exhibits no edema and no tenderness.  Lymphadenopathy:    He has no cervical adenopathy.  Neurological: He is oriented to person, place, and time.  Skin: Skin is warm and dry. No rash noted. He is not diaphoretic. No erythema. No pallor.  Psychiatric: He has a normal mood and affect. His behavior is normal. Judgment and thought content normal.      Lab Results  Component Value Date   WBC 5.9 08/04/2013   HGB 12.1* 08/04/2013   HCT 37.8* 08/04/2013   PLT 219.0 08/04/2013   GLUCOSE 108* 08/04/2013   CHOL  156 04/04/2013   TRIG 234.0* 04/04/2013   HDL 27.30* 04/04/2013   LDLDIRECT 82.7 04/04/2013   LDLCALC 108* 09/13/2012   ALT 14 04/04/2013   AST 18  04/04/2013   NA 137 08/04/2013   K 4.1 08/04/2013   CL 109 08/04/2013   CREATININE 1.0 08/04/2013   BUN 13 08/04/2013   CO2 23 08/04/2013   TSH 1.23 04/04/2013   PSA 1.61 03/31/2012   INR 0.95 08/17/2010   HGBA1C 6.5 08/04/2013      Assessment & Plan:

## 2013-08-25 NOTE — Assessment & Plan Note (Signed)
BP is well controlled 

## 2013-10-27 ENCOUNTER — Ambulatory Visit: Payer: Medicare Other | Admitting: Cardiology

## 2013-11-09 ENCOUNTER — Other Ambulatory Visit: Payer: Self-pay | Admitting: Cardiology

## 2013-11-14 ENCOUNTER — Encounter (INDEPENDENT_AMBULATORY_CARE_PROVIDER_SITE_OTHER): Payer: Self-pay

## 2013-11-14 ENCOUNTER — Ambulatory Visit (INDEPENDENT_AMBULATORY_CARE_PROVIDER_SITE_OTHER): Payer: Medicare Other | Admitting: Cardiology

## 2013-11-14 ENCOUNTER — Encounter: Payer: Self-pay | Admitting: Cardiology

## 2013-11-14 VITALS — BP 120/60 | HR 91 | Ht 70.0 in | Wt 237.0 lb

## 2013-11-14 DIAGNOSIS — I2510 Atherosclerotic heart disease of native coronary artery without angina pectoris: Secondary | ICD-10-CM

## 2013-11-14 DIAGNOSIS — I10 Essential (primary) hypertension: Secondary | ICD-10-CM

## 2013-11-14 DIAGNOSIS — E669 Obesity, unspecified: Secondary | ICD-10-CM

## 2013-11-14 DIAGNOSIS — IMO0001 Reserved for inherently not codable concepts without codable children: Secondary | ICD-10-CM

## 2013-11-14 MED ORDER — ISOSORBIDE MONONITRATE ER 120 MG PO TB24: 120.0000 mg | ORAL_TABLET | Freq: Every day | ORAL | Status: DC

## 2013-11-14 MED ORDER — PRAVASTATIN SODIUM 40 MG PO TABS: 40.0000 mg | ORAL_TABLET | Freq: Every day | ORAL | Status: DC

## 2013-11-14 MED ORDER — TELMISARTAN 40 MG PO TABS: 40.0000 mg | ORAL_TABLET | Freq: Every day | ORAL | Status: DC

## 2013-11-14 NOTE — Patient Instructions (Signed)
Your physician recommends that you continue on your current medications as directed. Please refer to the Current Medication list given to you today.  Your physician wants you to follow-up in: 6 months with Dr. Skains. You will receive a reminder letter in the mail two months in advance. If you don't receive a letter, please call our office to schedule the follow-up appointment.  

## 2013-11-14 NOTE — Progress Notes (Signed)
[address] [place], [place]  [address] Phone: [phone] Fax:  [phone]  Date:  11/14/2013   ID:  [name], [place] 09-18-1951, MRN [id number]  PCP:  Sanda Linger, MD   History of Present Illness: [name] is a 62 y.o. male male with a hx of diabetes, hypertension, hyperlipidemia, obesity, blindness, sleep apnea, angina, coronary artery disease.  Cardiac cath 10/2007 with PCI and BMS to proximal left circumflex by Dr Amil Amen. He has been noncompliant in the past with follow up and at pror visit ran out of his medications. He saw Dr Yetta Barre on 04/02/12, who restarted medications.  At prior visit had long discussion at last visit about the importance of his medications.  Currently he is doing very well. No complaints, no chest pain.    Wt Readings from Last 3 Encounters:  11/14/13 237 lb (107.502 kg)  08/23/13 237 lb (107.502 kg)  08/04/13 241 lb (109.317 kg)     Past Medical History  Diagnosis Date  . Glaucoma   . Ulcer   . HTN (hypertension)   . Hypercholesterolemia   . Diabetes mellitus     Type II  . CHF (congestive heart failure)   . CAD (coronary artery disease)   . LBP (low back pain)   . Legally blind     Past Surgical History  Procedure Laterality Date  . Prosthetic cornea placement, right eye  2007    [facility]  . Lumbar fusion    . Peptic ulcer dz surgery  pt was in his 20s    bleeding ulcer.   . Inguinal hernia repair      right  . Cholecystectomy  09/15/2012    Procedure: LAPAROSCOPIC CHOLECYSTECTOMY WITH INTRAOPERATIVE CHOLANGIOGRAM;  Surgeon: Valarie Merino, MD;  Location: [facility] OR;  Service: General;  Laterality: N/A;    Current Outpatient Prescriptions  Medication Sig Dispense Refill  . carvedilol (COREG) 12.5 MG tablet Take 1 tablet (12.5 mg total) by mouth 2 (two) times daily with a meal.  60 tablet  1  . docusate sodium (COLACE) 100 MG capsule Take 1 capsule (100 mg total) by mouth 2 (two) times daily.  60  capsule  0  . dorzolamide-timolol (COSOPT) 22.3-6.8 MG/ML ophthalmic solution Place 1 drop into the right eye 2 (two) times daily.  10 mL  1  . insulin lispro protamine-lispro (HUMALOG MIX 75/25) (75-25) 100 UNIT/ML SUSP 85 UNITS EVERY MORNING AND 45 UNITS EVERY EVENING.  40 mL  0  . isosorbide mononitrate (IMDUR) 60 MG 24 hr tablet Take 1.5 tablets (90 mg total) by mouth daily.  45 tablet  6  . Linagliptin-Metformin HCl (JENTADUETO) 2.5-850 MG TABS Take 1 tablet by mouth 2 (two) times daily.  280 tablet  0  . losartan (COZAAR) 50 MG tablet daily      . methazolamide (NEPTAZANE) 50 MG tablet Take 50 mg by mouth 3 (three) times daily.        Marland Kitchen oxyCODONE (OXY IR/ROXICODONE) 5 MG immediate release tablet Take 1 tablet (5 mg total) by mouth every 4 (four) hours as needed.  90 tablet  0  . pravastatin (PRAVACHOL) 40 MG tablet daily      . ranitidine (ZANTAC) 150 MG tablet Take 1 tablet (150 mg total) by mouth 2 (two) times daily.  180 tablet  3  . B-D INS SYR ULTRAFINE 1CC/30G 30G X 1/2" 1 ML MISC USE AS DIRECTED UP TO 3  TIMES A DAY  90 each  11  . Colesevelam HCl 3.75 G PACK Take 1 packet by mouth daily.  90 packet  3  . Icosapent Ethyl (VASCEPA) 1 G CAPS Take 2 capsules by mouth 2 (two) times daily.  120 capsule  11   No current facility-administered medications for this visit.    Allergies:    Allergies  Allergen Reactions  . Furosemide     pancreatitis  . Diamox [Acetazolamide] Itching  . Lisinopril Rash    Social History:  The patient  reports that he has never smoked. He has never used smokeless tobacco. He reports that he does not drink alcohol.   ROS:  Please see the history of present illness.   No fevers, no chills, no orthopnea, no PND   PHYSICAL EXAM: VS:  BP 120/60  Pulse 91  Ht 5\' 10"  (1.778 m)  Wt 237 lb (107.502 kg)  BMI 34.01 kg/m2 Well nourished, well developed, in no acute distress HEENT: Blind Neck: no JVD Cardiac:  normal S1, S2; RRR; no murmur Lungs:  clear to  auscultation bilaterally, no wheezing, rhonchi or rales Abd: soft, nontender, no hepatomegalyObese Ext: no edema Skin: warm and dry Neuro: no focal abnormalities noted  EKG:  None today     ASSESSMENT AND PLAN:  1. Coronary artery disease-no chest pain. I will change his isosorbide to 120 mg once a day for ease of use. Previously he was trying to split tablets. 2. Diabetes-he is having some trouble affording his diabetic medications. Dr. Yetta Barre managing. 3. Obesity-continue to encourage weight loss. Decrease carbohydrates. 4. Hyperlipidemia-continue with statin. Atorvastatin. 5. Blindness-no changes  Signed, Donato Schultz, MD [facility]  11/14/2013 2:55 PM

## 2013-12-09 ENCOUNTER — Telehealth: Payer: Self-pay

## 2013-12-09 NOTE — Telephone Encounter (Signed)
Phone call from patient requesting samples of insulin(humalog 75/25 mix). I let him know I do have that for him. He also requests samples of Jentadueto 2.5/850 mg. I let him know this is also available to pick up.

## 2013-12-12 ENCOUNTER — Telehealth: Payer: Self-pay | Admitting: *Deleted

## 2013-12-12 DIAGNOSIS — M545 Low back pain, unspecified: Secondary | ICD-10-CM

## 2013-12-12 MED ORDER — OXYCODONE HCL 5 MG PO TABS: 5.0000 mg | ORAL_TABLET | ORAL | Status: DC | PRN

## 2013-12-12 NOTE — Telephone Encounter (Signed)
Pt called requesting Oxycodone refill.  Please advise 

## 2013-12-12 NOTE — Telephone Encounter (Signed)
done

## 2013-12-13 ENCOUNTER — Encounter (HOSPITAL_COMMUNITY): Payer: Self-pay | Admitting: Emergency Medicine

## 2013-12-13 ENCOUNTER — Emergency Department (HOSPITAL_COMMUNITY)
Admission: EM | Admit: 2013-12-13 | Discharge: 2013-12-14 | Disposition: A | Discharge status: Home / Self Care | Payer: Medicare Other | Attending: Emergency Medicine | Admitting: Emergency Medicine

## 2013-12-13 DIAGNOSIS — I2510 Atherosclerotic heart disease of native coronary artery without angina pectoris: Secondary | ICD-10-CM | POA: Insufficient documentation

## 2013-12-13 DIAGNOSIS — E119 Type 2 diabetes mellitus without complications: Secondary | ICD-10-CM | POA: Insufficient documentation

## 2013-12-13 DIAGNOSIS — N3090 Cystitis, unspecified without hematuria: Secondary | ICD-10-CM | POA: Insufficient documentation

## 2013-12-13 DIAGNOSIS — Z794 Long term (current) use of insulin: Secondary | ICD-10-CM | POA: Insufficient documentation

## 2013-12-13 DIAGNOSIS — Z872 Personal history of diseases of the skin and subcutaneous tissue: Secondary | ICD-10-CM | POA: Insufficient documentation

## 2013-12-13 DIAGNOSIS — Z8739 Personal history of other diseases of the musculoskeletal system and connective tissue: Secondary | ICD-10-CM | POA: Insufficient documentation

## 2013-12-13 DIAGNOSIS — Z8669 Personal history of other diseases of the nervous system and sense organs: Secondary | ICD-10-CM | POA: Insufficient documentation

## 2013-12-13 DIAGNOSIS — I10 Essential (primary) hypertension: Secondary | ICD-10-CM | POA: Insufficient documentation

## 2013-12-13 DIAGNOSIS — I509 Heart failure, unspecified: Secondary | ICD-10-CM | POA: Insufficient documentation

## 2013-12-13 DIAGNOSIS — Z79899 Other long term (current) drug therapy: Secondary | ICD-10-CM | POA: Insufficient documentation

## 2013-12-13 LAB — URINALYSIS, ROUTINE W REFLEX MICROSCOPIC
Glucose, UA: NEGATIVE mg/dL
Ketones, ur: 15 mg/dL — AB
Nitrite: NEGATIVE
Protein, ur: 100 mg/dL — AB
Urobilinogen, UA: 1 mg/dL (ref 0.0–1.0)

## 2013-12-13 LAB — URINE MICROSCOPIC-ADD ON

## 2013-12-13 NOTE — ED Notes (Addendum)
Pt c/o urinary frequency with hematuria.  Onset earlier today but has become worse tonight.  Pt also c/o burning with urination

## 2013-12-13 NOTE — Telephone Encounter (Signed)
Spoke with pt advised Rx ready. 

## 2013-12-13 NOTE — ED Notes (Signed)
Family at bedside. 

## 2013-12-14 ENCOUNTER — Encounter (HOSPITAL_COMMUNITY): Payer: Self-pay | Admitting: Radiology

## 2013-12-14 ENCOUNTER — Emergency Department (HOSPITAL_COMMUNITY): Payer: Medicare Other

## 2013-12-14 LAB — POCT I-STAT, CHEM 8
Chloride: 102 mEq/L (ref 96–112)
Glucose, Bld: 169 mg/dL — ABNORMAL HIGH (ref 70–99)
HCT: 42 % (ref 39.0–52.0)
Hemoglobin: 14.3 g/dL (ref 13.0–17.0)
Potassium: 4.1 mEq/L (ref 3.5–5.1)
Sodium: 139 mEq/L (ref 135–145)

## 2013-12-14 LAB — CBC WITH DIFFERENTIAL/PLATELET
Basophils Relative: 0 % (ref 0–1)
Eosinophils Relative: 2 % (ref 0–5)
HCT: 39 % (ref 39.0–52.0)
Lymphs Abs: 1.5 10*3/uL (ref 0.7–4.0)
MCHC: 32.6 g/dL (ref 30.0–36.0)
MCV: 72.4 fL — ABNORMAL LOW (ref 78.0–100.0)
Monocytes Absolute: 0.5 10*3/uL (ref 0.1–1.0)
Neutro Abs: 5.2 10*3/uL (ref 1.7–7.7)
Neutrophils Relative %: 71 % (ref 43–77)
Platelets: 180 10*3/uL (ref 150–400)
RDW: 14.6 % (ref 11.5–15.5)
WBC: 7.4 10*3/uL (ref 4.0–10.5)

## 2013-12-14 MED ORDER — KETOROLAC TROMETHAMINE 60 MG/2ML IM SOLN
60.0000 mg | Freq: Once | INTRAMUSCULAR | Status: AC
  Administered 2013-12-14: 60 mg via INTRAMUSCULAR
  Filled 2013-12-14: qty 2

## 2013-12-14 MED ORDER — CEFTRIAXONE SODIUM 1 G IJ SOLR
1.0000 g | Freq: Once | INTRAMUSCULAR | Status: AC
  Administered 2013-12-14: 1 g via INTRAMUSCULAR
  Filled 2013-12-14: qty 10

## 2013-12-14 MED ORDER — CEPHALEXIN 500 MG PO CAPS: 500.0000 mg | ORAL_CAPSULE | Freq: Four times a day (QID) | ORAL | Status: DC

## 2013-12-14 MED ORDER — PHENAZOPYRIDINE HCL 200 MG PO TABS: 200.0000 mg | ORAL_TABLET | Freq: Three times a day (TID) | ORAL | Status: DC

## 2013-12-14 MED ORDER — LIDOCAINE HCL (PF) 1 % IJ SOLN
INTRAMUSCULAR | Status: AC
  Administered 2013-12-14: 2.1 mL
  Filled 2013-12-14: qty 5

## 2013-12-14 NOTE — ED Provider Notes (Signed)
CSN: [id number]     Arrival date & time 12/13/13  2249 History   First MD Initiated Contact with Patient 12/13/13 2334     Chief Complaint  Patient presents with  . Urinary Frequency   (Consider location/radiation/quality/duration/timing/severity/associated sxs/prior Treatment) Patient is a 62 y.o. male presenting with frequency and dysuria. The history is provided by the patient.  Urinary Frequency This is a new problem. The current episode started 3 to 5 hours ago. The problem occurs constantly. The problem has not changed since onset.Pertinent negatives include no chest pain, no abdominal pain, no headaches and no shortness of breath. Nothing aggravates the symptoms. Nothing relieves the symptoms. He has tried nothing for the symptoms. The treatment provided no relief.  Dysuria This is a new problem. The current episode started 6 to 12 hours ago. The problem occurs constantly. The problem has not changed since onset.Pertinent negatives include no chest pain, no abdominal pain, no headaches and no shortness of breath. Nothing aggravates the symptoms. Nothing relieves the symptoms. He has tried nothing for the symptoms. The treatment provided no relief.    Past Medical History  Diagnosis Date  . Glaucoma   . Ulcer   . HTN (hypertension)   . Hypercholesterolemia   . Diabetes mellitus     Type II  . CHF (congestive heart failure)   . CAD (coronary artery disease)   . LBP (low back pain)   . Legally blind    Past Surgical History  Procedure Laterality Date  . Prosthetic cornea placement, right eye  2007    [facility]  . Lumbar fusion    . Peptic ulcer dz surgery  pt was in his 20s    bleeding ulcer.   . Inguinal hernia repair      right  . Cholecystectomy  09/15/2012    Procedure: LAPAROSCOPIC CHOLECYSTECTOMY WITH INTRAOPERATIVE CHOLANGIOGRAM;  Surgeon: Valarie Merino, MD;  Location: [facility] OR;  Service: General;  Laterality: N/A;   Family History  Problem Relation Age of  Onset  . Breast cancer Mother   . Colon cancer Mother   . Colon cancer Other     Elevated Risk for  . Hypertension Mother   . Hypertension Father   . Hypertension Father   . Diabetes Mother   . Diabetes Sister   . Diabetes Brother    History  Substance Use Topics  . Smoking status: Never Smoker   . Smokeless tobacco: Never Used  . Alcohol Use: No    Review of Systems  Constitutional: Negative for fever.  Respiratory: Negative for shortness of breath.   Cardiovascular: Negative for chest pain.  Gastrointestinal: Negative for abdominal pain.  Genitourinary: Positive for dysuria and frequency.  Neurological: Negative for headaches.  All other systems reviewed and are negative.    Allergies  Furosemide; Diamox; and Lisinopril  Home Medications   Current Outpatient Rx  Name  Route  Sig  Dispense  Refill  . B-D INS SYR ULTRAFINE 1CC/30G 30G X 1/2" 1 ML MISC      USE AS DIRECTED UP TO 3 TIMES A DAY   90 each   11   . carvedilol (COREG) 12.5 MG tablet   Oral   Take 1 tablet (12.5 mg total) by mouth 2 (two) times daily with a meal.   60 tablet   1   . Colesevelam HCl 3.75 G PACK   Oral   Take 1 packet by mouth daily.   90 packet   3   .  docusate sodium (COLACE) 100 MG capsule   Oral   Take 1 capsule (100 mg total) by mouth 2 (two) times daily.   60 capsule   0     To prevent constipation while taking oxycodone.   . dorzolamide-timolol (COSOPT) 22.3-6.8 MG/ML ophthalmic solution   Right Eye   Place 1 drop into the right eye 2 (two) times daily.   10 mL   1   . Icosapent Ethyl (VASCEPA) 1 G CAPS   Oral   Take 2 capsules by mouth 2 (two) times daily.   120 capsule   11   . insulin lispro protamine-lispro (HUMALOG MIX 75/25) (75-25) 100 UNIT/ML SUSP      85 UNITS EVERY MORNING AND 45 UNITS EVERY EVENING.   40 mL   0   . isosorbide mononitrate (IMDUR) 120 MG 24 hr tablet   Oral   Take 1 tablet (120 mg total) by mouth daily.   30 tablet   3   .  Linagliptin-Metformin HCl (JENTADUETO) 2.5-850 MG TABS   Oral   Take 1 tablet by mouth 2 (two) times daily.   280 tablet   0   . losartan (COZAAR) 50 MG tablet      daily         . methazolamide (NEPTAZANE) 50 MG tablet   Oral   Take 50 mg by mouth 3 (three) times daily.           Marland Kitchen oxyCODONE (OXY IR/ROXICODONE) 5 MG immediate release tablet   Oral   Take 1 tablet (5 mg total) by mouth every 4 (four) hours as needed.   90 tablet   0   . pravastatin (PRAVACHOL) 40 MG tablet   Oral   Take 1 tablet (40 mg total) by mouth daily. daily   30 tablet   4   . ranitidine (ZANTAC) 150 MG tablet   Oral   Take 1 tablet (150 mg total) by mouth 2 (two) times daily.   180 tablet   3   . telmisartan (MICARDIS) 40 MG tablet   Oral   Take 1 tablet (40 mg total) by mouth daily.   30 tablet   4    BP 164/90  Pulse 81  Temp(Src) 97.4 F (36.3 C) (Oral)  Resp 16  SpO2 98% Physical Exam  Constitutional: He is oriented to person, place, and time. He appears well-developed and well-nourished. No distress.  HENT:  Head: Normocephalic and atraumatic.  Mouth/Throat: Oropharynx is clear and moist.  Eyes: Conjunctivae are normal. Pupils are equal, round, and reactive to light.  Neck: Normal range of motion. Neck supple.  Cardiovascular: Normal rate, regular rhythm and intact distal pulses.   Pulmonary/Chest: Effort normal and breath sounds normal. He has no wheezes. He has no rales.  Abdominal: Soft. Bowel sounds are normal. There is no tenderness. There is no rebound and no guarding.  Musculoskeletal: Normal range of motion.  Neurological: He is alert and oriented to person, place, and time.  Skin: Skin is warm and dry.  Psychiatric: He has a normal mood and affect.    ED Course  Procedures (including critical care time) Labs Review Labs Reviewed  URINALYSIS, ROUTINE W REFLEX MICROSCOPIC - Abnormal; Notable for the following:    Color, Urine RED (*)    APPearance TURBID (*)     Hgb urine dipstick LARGE (*)    Bilirubin Urine MODERATE (*)    Ketones, ur 15 (*)    Protein, ur  100 (*)    Leukocytes, UA LARGE (*)    All other components within normal limits  URINE MICROSCOPIC-ADD ON - Abnormal; Notable for the following:    Bacteria, UA MANY (*)    All other components within normal limits  URINE CULTURE  CBC WITH DIFFERENTIAL   Imaging Review No results found.  EKG Interpretation   None       MDM  No diagnosis found. Follow up with your PMD for recheck and urology for ongoing hematuria.  Take all antibiotics return for weakness fevers > 101 inability to tolerate liquids or any concerns.      Jasmine Awe, MD 12/14/13 [phone]

## 2013-12-16 LAB — URINE CULTURE

## 2013-12-17 ENCOUNTER — Telehealth (HOSPITAL_COMMUNITY): Payer: Self-pay | Admitting: Emergency Medicine

## 2013-12-17 NOTE — ED Notes (Signed)
Post ED Visit - Positive Culture Follow-up  Culture report reviewed by antimicrobial stewardship pharmacist: []  Wes Dulaney, Pharm.D., BCPS []  Celedonio Miyamoto, Pharm.D., BCPS []  Georgina Pillion, Pharm.D., BCPS []  [place], [address].D., BCPS, AAHIVP [x]  Estella Husk, Pharm.D., BCPS, AAHIVP  Positive urine culture Treated with Keflex, organism sensitive to the same and no further patient follow-up is required at this time.  [place], Jenel Lucks 12/17/2013, 1:09 PM

## 2013-12-19 DIAGNOSIS — H401190 Primary open-angle glaucoma, unspecified eye, stage unspecified: Secondary | ICD-10-CM | POA: Insufficient documentation

## 2013-12-28 ENCOUNTER — Encounter: Payer: Self-pay | Admitting: Internal Medicine

## 2013-12-28 ENCOUNTER — Other Ambulatory Visit (INDEPENDENT_AMBULATORY_CARE_PROVIDER_SITE_OTHER): Payer: Medicare Other

## 2013-12-28 ENCOUNTER — Ambulatory Visit (INDEPENDENT_AMBULATORY_CARE_PROVIDER_SITE_OTHER): Payer: Medicare Other | Admitting: Internal Medicine

## 2013-12-28 VITALS — BP 124/68 | HR 74 | Temp 97.8°F | Resp 16 | Ht 70.0 in | Wt 239.0 lb

## 2013-12-28 DIAGNOSIS — I739 Peripheral vascular disease, unspecified: Secondary | ICD-10-CM

## 2013-12-28 DIAGNOSIS — G894 Chronic pain syndrome: Secondary | ICD-10-CM

## 2013-12-28 DIAGNOSIS — I10 Essential (primary) hypertension: Secondary | ICD-10-CM

## 2013-12-28 DIAGNOSIS — M545 Low back pain, unspecified: Secondary | ICD-10-CM

## 2013-12-28 DIAGNOSIS — E1165 Type 2 diabetes mellitus with hyperglycemia: Principal | ICD-10-CM

## 2013-12-28 DIAGNOSIS — IMO0001 Reserved for inherently not codable concepts without codable children: Secondary | ICD-10-CM

## 2013-12-28 DIAGNOSIS — Z0000 Encounter for general adult medical examination without abnormal findings: Secondary | ICD-10-CM

## 2013-12-28 LAB — URINALYSIS, ROUTINE W REFLEX MICROSCOPIC
Bilirubin Urine: NEGATIVE
HGB URINE DIPSTICK: NEGATIVE
Ketones, ur: NEGATIVE
Leukocytes, UA: NEGATIVE
Nitrite: NEGATIVE
Specific Gravity, Urine: 1.025 (ref 1.000–1.030)
TOTAL PROTEIN, URINE-UPE24: NEGATIVE
URINE GLUCOSE: NEGATIVE
Urobilinogen, UA: 0.2 (ref 0.0–1.0)
pH: 6 (ref 5.0–8.0)

## 2013-12-28 LAB — BASIC METABOLIC PANEL
BUN: 12 mg/dL (ref 6–23)
CALCIUM: 8.7 mg/dL (ref 8.4–10.5)
CO2: 29 meq/L (ref 19–32)
Chloride: 104 mEq/L (ref 96–112)
Creatinine, Ser: 1 mg/dL (ref 0.4–1.5)
GFR: 97.77 mL/min (ref >60.00)
Glucose, Bld: 150 mg/dL — ABNORMAL HIGH (ref 70–99)
Potassium: 4.1 mEq/L (ref 3.5–5.1)
Sodium: 139 mEq/L (ref 135–145)

## 2013-12-28 LAB — HEMOGLOBIN A1C: Hgb A1c MFr Bld: 7.1 % — ABNORMAL HIGH (ref 4.6–6.5)

## 2013-12-28 MED ORDER — OXYCODONE HCL 5 MG PO TABS
5.0000 mg | ORAL_TABLET | ORAL | Status: DC | PRN
Start: 2013-12-28 — End: 2014-10-25

## 2013-12-28 NOTE — Progress Notes (Signed)
Pre visit review using our clinic review tool, if applicable. No additional management support is needed unless otherwise documented below in the visit note. 

## 2013-12-28 NOTE — Progress Notes (Signed)
Subjective:    Patient ID: [name], male    DOB: 11/09/1951, 63 y.o.   MRN: [id number]  Back Pain This is a chronic problem. The current episode started more than 1 year ago. The problem occurs intermittently. The pain is present in the lumbar spine. The quality of the pain is described as aching. The pain does not radiate. The pain is at a severity of 5/10. The pain is moderate. The pain is worse during the day. The symptoms are aggravated by bending and position. Pertinent negatives include no abdominal pain, bladder incontinence, bowel incontinence, chest pain, dysuria, fever, headaches, leg pain, numbness, paresis, paresthesias, pelvic pain, perianal numbness, tingling, weakness or weight loss. Risk factors include obesity, lack of exercise and poor posture. He has tried analgesics and NSAIDs for the symptoms. The treatment provided moderate relief.      Review of Systems  Constitutional: Negative.  Negative for fever, chills, weight loss, diaphoresis, appetite change and fatigue.  HENT: Negative.   Eyes: Negative.   Respiratory: Negative.  Negative for cough, choking, chest tightness, shortness of breath, wheezing and stridor.   Cardiovascular: Negative.  Negative for chest pain, palpitations and leg swelling.  Gastrointestinal: Negative.  Negative for nausea, vomiting, abdominal pain, diarrhea, constipation, rectal pain and bowel incontinence.  Endocrine: Negative.  Negative for polydipsia, polyphagia and polyuria.  Genitourinary: Negative.  Negative for bladder incontinence, dysuria and pelvic pain.  Musculoskeletal: Positive for back pain. Negative for arthralgias, gait problem, joint swelling, myalgias, neck pain and neck stiffness.  Skin: Negative.   Allergic/Immunologic: Negative.  Negative for environmental allergies, food allergies and immunocompromised state.  Neurological: Negative.  Negative for tingling, weakness, numbness, headaches and paresthesias.  Hematological:  Negative.  Negative for adenopathy. Does not bruise/bleed easily.  Psychiatric/Behavioral: Negative.        Objective:   Physical Exam  Vitals reviewed. Constitutional: He is oriented to person, place, and time. He appears well-developed and well-nourished. No distress.  HENT:  Head: Normocephalic and atraumatic.  Mouth/Throat: Oropharynx is clear and moist. No oropharyngeal exudate.  Eyes: Conjunctivae are normal. Right eye exhibits no discharge. Left eye exhibits no discharge. No scleral icterus.  Neck: Normal range of motion. Neck supple. No JVD present. No tracheal deviation present. No thyromegaly present.  Cardiovascular: Normal rate, regular rhythm, normal heart sounds and intact distal pulses.  Exam reveals no gallop and no friction rub.   No murmur heard. Pulmonary/Chest: Effort normal and breath sounds normal. No stridor. No respiratory distress. He has no wheezes. He has no rales. He exhibits no tenderness.  Abdominal: Soft. Bowel sounds are normal. He exhibits no distension and no mass. There is no tenderness. There is no rebound and no guarding.  Musculoskeletal: Normal range of motion. He exhibits no edema and no tenderness.       Lumbar back: Normal. He exhibits normal range of motion, no tenderness, no bony tenderness, no swelling, no edema, no deformity, no laceration, no pain, no spasm and normal pulse.  Lymphadenopathy:    He has no cervical adenopathy.  Neurological: He is alert and oriented to person, place, and time. He has normal strength. He displays no atrophy, no tremor and normal reflexes. No cranial nerve deficit or sensory deficit. He exhibits normal muscle tone. He displays a negative Romberg sign. He displays no seizure activity. Coordination and gait normal.  Reflex Scores:      Tricep reflexes are 0 on the right side and 0 on the left side.  Bicep reflexes are 0 on the right side and 0 on the left side.      Brachioradialis reflexes are 0 on the right  side and 0 on the left side.      Patellar reflexes are 0 on the right side and 0 on the left side.      Achilles reflexes are 0 on the right side and 0 on the left side. Neg SLR in BLE  Skin: Skin is warm and dry. No rash noted. He is not diaphoretic. No erythema. No pallor.  Psychiatric: He has a normal mood and affect. His behavior is normal. Judgment and thought content normal.     Lab Results  Component Value Date   WBC 7.4 12/14/2013   HGB 14.3 12/14/2013   HCT 42.0 12/14/2013   PLT 180 12/14/2013   GLUCOSE 169* 12/14/2013   CHOL 156 04/04/2013   TRIG 234.0* 04/04/2013   HDL 27.30* 04/04/2013   LDLDIRECT 82.7 04/04/2013   LDLCALC 108* 09/13/2012   ALT 14 04/04/2013   AST 18 04/04/2013   NA 139 12/14/2013   K 4.1 12/14/2013   CL 102 12/14/2013   CREATININE 1.20 12/14/2013   BUN 14 12/14/2013   CO2 23 08/04/2013   TSH 1.23 04/04/2013   PSA 1.61 03/31/2012   INR 0.95 08/17/2010   HGBA1C 6.5 08/04/2013       Assessment & Plan:

## 2013-12-28 NOTE — Patient Instructions (Signed)
Type 2 Diabetes Mellitus, Adult Type 2 diabetes mellitus, often simply referred to as type 2 diabetes, is a long-lasting (chronic) disease. In type 2 diabetes, the pancreas does not make enough insulin (a hormone), the cells are less responsive to the insulin that is made (insulin resistance), or both. Normally, insulin moves sugars from food into the tissue cells. The tissue cells use the sugars for energy. The lack of insulin or the lack of normal response to insulin causes excess sugars to build up in the blood instead of going into the tissue cells. As a result, high blood sugar (hyperglycemia) develops. The effect of high sugar (glucose) levels can cause many complications. Type 2 diabetes was also previously called adult-onset diabetes but it can occur at any age.  RISK FACTORS  A person is predisposed to developing type 2 diabetes if someone in the family has the disease and also has one or more of the following primary risk factors:  Overweight.  An inactive lifestyle.  A history of consistently eating high-calorie foods. Maintaining a normal weight and regular physical activity can reduce the chance of developing type 2 diabetes. SYMPTOMS  A person with type 2 diabetes may not show symptoms initially. The symptoms of type 2 diabetes appear slowly. The symptoms include:  Increased thirst (polydipsia).  Increased urination (polyuria).  Increased urination during the night (nocturia).  Weight loss. This weight loss may be rapid.  Frequent, recurring infections.  Tiredness (fatigue).  Weakness.  Vision changes, such as blurred vision.  Fruity smell to your breath.  Abdominal pain.  Nausea or vomiting.  Cuts or bruises which are slow to heal.  Tingling or numbness in the hands or feet. DIAGNOSIS Type 2 diabetes is frequently not diagnosed until complications of diabetes are present. Type 2 diabetes is diagnosed when symptoms or complications are present and when blood  glucose levels are increased. Your blood glucose level may be checked by one or more of the following blood tests:  A fasting blood glucose test. You will not be allowed to eat for at least 8 hours before a blood sample is taken.  A random blood glucose test. Your blood glucose is checked at any time of the day regardless of when you ate.  A hemoglobin A1c blood glucose test. A hemoglobin A1c test provides information about blood glucose control over the previous 3 months.  An oral glucose tolerance test (OGTT). Your blood glucose is measured after you have not eaten (fasted) for 2 hours and then after you drink a glucose-containing beverage. TREATMENT   You may need to take insulin or diabetes medicine daily to keep blood glucose levels in the desired range.  You will need to match insulin dosing with exercise and healthy food choices. The treatment goal is to maintain the before meal blood sugar (preprandial glucose) level at 70 130 mg/dL. HOME CARE INSTRUCTIONS   Have your hemoglobin A1c level checked twice a year.  Perform daily blood glucose monitoring as directed by your caregiver.  Monitor urine ketones when you are ill and as directed by your caregiver.  Take your diabetes medicine or insulin as directed by your caregiver to maintain your blood glucose levels in the desired range.  Never run out of diabetes medicine or insulin. It is needed every day.  Adjust insulin based on your intake of carbohydrates. Carbohydrates can raise blood glucose levels but need to be included in your diet. Carbohydrates provide vitamins, minerals, and fiber which are an essential part of   a healthy diet. Carbohydrates are found in fruits, vegetables, whole grains, dairy products, legumes, and foods containing added sugars.    Eat healthy foods. Alternate 3 meals with 3 snacks.  Lose weight if overweight.  Carry a medical alert card or wear your medical alert jewelry.  Carry a 15 gram  carbohydrate snack with you at all times to treat low blood glucose (hypoglycemia). Some examples of 15 gram carbohydrate snacks include:  Glucose tablets, 3 or 4   Glucose gel, 15 gram tube  Raisins, 2 tablespoons (24 grams)  Jelly beans, 6  Animal crackers, 8  Regular pop, 4 ounces (120 mL)  Gummy treats, 9  Recognize hypoglycemia. Hypoglycemia occurs with blood glucose levels of 70 mg/dL and below. The risk for hypoglycemia increases when fasting or skipping meals, during or after intense exercise, and during sleep. Hypoglycemia symptoms can include:  Tremors or shakes.  Decreased ability to concentrate.  Sweating.  Increased heart rate.  Headache.  Dry mouth.  Hunger.  Irritability.  Anxiety.  Restless sleep.  Altered speech or coordination.  Confusion.  Treat hypoglycemia promptly. If you are alert and able to safely swallow, follow the 15:15 rule:  Take 15 20 grams of rapid-acting glucose or carbohydrate. Rapid-acting options include glucose gel, glucose tablets, or 4 ounces (120 mL) of fruit juice, regular soda, or low fat milk.  Check your blood glucose level 15 minutes after taking the glucose.  Take 15 20 grams more of glucose if the repeat blood glucose level is still 70 mg/dL or below.  Eat a meal or snack within 1 hour once blood glucose levels return to normal.    Be alert to polyuria and polydipsia which are early signs of hyperglycemia. An early awareness of hyperglycemia allows for prompt treatment. Treat hyperglycemia as directed by your caregiver.  Engage in at least 150 minutes of moderate-intensity physical activity a week, spread over at least 3 days of the week or as directed by your caregiver. In addition, you should engage in resistance exercise at least 2 times a week or as directed by your caregiver.  Adjust your medicine and food intake as needed if you start a new exercise or sport.  Follow your sick day plan at any time you  are unable to eat or drink as usual.  Avoid tobacco use.  Limit alcohol intake to no more than 1 drink per day for nonpregnant women and 2 drinks per day for men. You should drink alcohol only when you are also eating food. Talk with your caregiver whether alcohol is safe for you. Tell your caregiver if you drink alcohol several times a week.  Follow up with your caregiver regularly.  Schedule an eye exam soon after the diagnosis of type 2 diabetes and then annually.  Perform daily skin and foot care. Examine your skin and feet daily for cuts, bruises, redness, nail problems, bleeding, blisters, or sores. A foot exam by a caregiver should be done annually.  Brush your teeth and gums at least twice a day and floss at least once a day. Follow up with your dentist regularly.  Share your diabetes management plan with your workplace or school.  Stay up-to-date with immunizations.  Learn to manage stress.  Obtain ongoing diabetes education and support as needed.  Participate in, or seek rehabilitation as needed to maintain or improve independence and quality of life. Request a physical or occupational therapy referral if you are having foot or hand numbness or difficulties with grooming,   dressing, eating, or physical activity. SEEK MEDICAL CARE IF:   You are unable to eat food or drink fluids for more than 6 hours.  You have nausea and vomiting for more than 6 hours.  Your blood glucose level is over 240 mg/dL.  There is a change in mental status.  You develop an additional serious illness.  You have diarrhea for more than 6 hours.  You have been sick or have had a fever for a couple of days and are not getting better.  You have pain during any physical activity.  SEEK IMMEDIATE MEDICAL CARE IF:  You have difficulty breathing.  You have moderate to large ketone levels. MAKE SURE YOU:  Understand these instructions.  Will watch your condition.  Will get help right away if  you are not doing well or get worse. Document Released: 12/08/2005 Document Revised: 09/01/2012 Document Reviewed: 07/06/2012 [employer] Patient Information 2014 [employer].  

## 2013-12-29 LAB — DRUGS OF ABUSE SCREEN W/O ALC, ROUTINE URINE
Amphetamine Screen, Ur: NEGATIVE
BARBITURATE QUANT UR: NEGATIVE
Benzodiazepines.: NEGATIVE
CREATININE, U: 210.2 mg/dL
Cocaine Metabolites: NEGATIVE
METHADONE: NEGATIVE
Marijuana Metabolite: NEGATIVE
OPIATE SCREEN, URINE: NEGATIVE
PROPOXYPHENE: NEGATIVE
Phencyclidine (PCP): NEGATIVE

## 2013-12-29 LAB — OXYCODONE SCREEN, UA, RFLX CONFIRM: Oxycodone Screen, Ur: NEGATIVE ng/mL

## 2013-12-29 NOTE — Assessment & Plan Note (Signed)
He has taken oxycodone on the past with good response and he requests a refill today I have also asked him to see pain management about this

## 2013-12-29 NOTE — Assessment & Plan Note (Signed)
I will check his ABI's to see how severe this is

## 2013-12-29 NOTE — Assessment & Plan Note (Signed)
His UDS is all negative which is c/w what he tells me about being "out of" oxycodone for one month I have asked him to see pain management

## 2013-12-29 NOTE — Assessment & Plan Note (Signed)
His A1C shows that his blood sugars are well controlled

## 2014-01-04 ENCOUNTER — Encounter (HOSPITAL_COMMUNITY): Payer: Medicare Other

## 2014-01-04 DIAGNOSIS — R0989 Other specified symptoms and signs involving the circulatory and respiratory systems: Secondary | ICD-10-CM

## 2014-01-12 ENCOUNTER — Encounter (HOSPITAL_COMMUNITY): Payer: Self-pay | Admitting: Cardiology

## 2014-01-17 ENCOUNTER — Encounter: Payer: Self-pay | Admitting: Physical Medicine & Rehabilitation

## 2014-01-30 ENCOUNTER — Other Ambulatory Visit: Payer: Self-pay

## 2014-01-30 MED ORDER — ISOSORBIDE MONONITRATE ER 60 MG PO TB24: 90.0000 mg | ORAL_TABLET | Freq: Every day | ORAL | Status: DC

## 2014-02-13 ENCOUNTER — Encounter (INDEPENDENT_AMBULATORY_CARE_PROVIDER_SITE_OTHER): Payer: Self-pay

## 2014-02-13 ENCOUNTER — Encounter: Payer: Self-pay | Admitting: Physical Medicine & Rehabilitation

## 2014-02-13 ENCOUNTER — Encounter: Payer: Medicare Other | Attending: Physical Medicine & Rehabilitation

## 2014-02-13 ENCOUNTER — Ambulatory Visit (HOSPITAL_BASED_OUTPATIENT_CLINIC_OR_DEPARTMENT_OTHER): Payer: Medicare Other | Admitting: Physical Medicine & Rehabilitation

## 2014-02-13 VITALS — BP 163/63 | HR 75 | Resp 14 | Ht 70.0 in | Wt 244.0 lb

## 2014-02-13 DIAGNOSIS — H547 Unspecified visual loss: Secondary | ICD-10-CM | POA: Insufficient documentation

## 2014-02-13 DIAGNOSIS — E1149 Type 2 diabetes mellitus with other diabetic neurological complication: Secondary | ICD-10-CM | POA: Insufficient documentation

## 2014-02-13 DIAGNOSIS — M545 Low back pain, unspecified: Secondary | ICD-10-CM

## 2014-02-13 DIAGNOSIS — Z79899 Other long term (current) drug therapy: Secondary | ICD-10-CM

## 2014-02-13 DIAGNOSIS — M961 Postlaminectomy syndrome, not elsewhere classified: Secondary | ICD-10-CM | POA: Insufficient documentation

## 2014-02-13 DIAGNOSIS — E1140 Type 2 diabetes mellitus with diabetic neuropathy, unspecified: Secondary | ICD-10-CM | POA: Insufficient documentation

## 2014-02-13 DIAGNOSIS — E1142 Type 2 diabetes mellitus with diabetic polyneuropathy: Secondary | ICD-10-CM | POA: Insufficient documentation

## 2014-02-13 DIAGNOSIS — Z5181 Encounter for therapeutic drug level monitoring: Secondary | ICD-10-CM

## 2014-02-13 DIAGNOSIS — R279 Unspecified lack of coordination: Secondary | ICD-10-CM | POA: Insufficient documentation

## 2014-02-13 DIAGNOSIS — G8929 Other chronic pain: Secondary | ICD-10-CM | POA: Insufficient documentation

## 2014-02-13 MED ORDER — TRAMADOL HCL 50 MG PO TABS: 50.0000 mg | ORAL_TABLET | Freq: Two times a day (BID) | ORAL | Status: DC

## 2014-02-13 NOTE — Progress Notes (Signed)
Subjective:    Patient ID: [name], male    DOB: 1951/01/09, 63 y.o.   MRN: [id number]  HPI L4-L5 diskectomy in 2003-2004, post op infection treated at [facility] and [facility].  Right hemiparesis which required inpt and outpt  Rehab.   Hemiparesis resolved Legally blind as well.  Saw a psychiatrist in the hospital for depression Limited sitting standing and walking  Last oxycodone about one week ago. Pt takes pain meds on an as needed basis, not every day Tried oxycodone, has not tried tramadol  Pain Inventory Average Pain 9 Pain Right Now 7 My pain is constant, burning and aching  In the last 24 hours, has pain interfered with the following? General activity 10 Relation with others 7 Enjoyment of life 9 What TIME of day is your pain at its worst? all day Sleep (in general) Fair  Pain is worse with: walking, sitting, inactivity, standing and some activites Pain improves with: rest and medication Relief from Meds: 5  Mobility walk without assistance walk with assistance use a cane how many minutes can you walk? 1 ability to climb steps?  no do you drive?  no use a wheelchair  Function disabled: date disabled 2003-2004  Neuro/Psych numbness tingling spasms depression  Prior Studies Any changes since last visit?  no  Physicians involved in your care Any changes since last visit?  yes Primary care Dr. [name]   Family History  Problem Relation Age of Onset  . Breast cancer Mother   . Colon cancer Mother   . Colon cancer Other     Elevated Risk for  . Hypertension Mother   . Hypertension Father   . Hypertension Father   . Diabetes Mother   . Diabetes Sister   . Diabetes Brother    History   Social History  . Marital Status: Married    Spouse Name: N/A    Number of Children: N/A  . Years of Education: N/A   Occupational History  . disabled     blind   Social History Main Topics  . Smoking status: Never Smoker   . Smokeless  tobacco: Never Used  . Alcohol Use: No  . Drug Use: None  . Sexual Activity: Not Currently   Other Topics Concern  . None   Social History Narrative   Occupation: disabled, blind   Married   Regular Exercise-no         Past Surgical History  Procedure Laterality Date  . Prosthetic cornea placement, right eye  2007    [facility]  . Lumbar fusion    . Peptic ulcer dz surgery  pt was in his 20s    bleeding ulcer.   . Inguinal hernia repair      right  . Cholecystectomy  09/15/2012    Procedure: LAPAROSCOPIC CHOLECYSTECTOMY WITH INTRAOPERATIVE CHOLANGIOGRAM;  Surgeon: Pedro Earls, MD;  Location: [place];  Service: General;  Laterality: N/A;   Past Medical History  Diagnosis Date  . Glaucoma   . Ulcer   . HTN (hypertension)   . Hypercholesterolemia   . Diabetes mellitus     Type II  . CHF (congestive heart failure)   . CAD (coronary artery disease)   . LBP (low back pain)   . Legally blind    BP 163/63  Pulse 75  Resp 14  Ht 5\' 10"  (1.778 m)  Wt 244 lb (110.678 kg)  BMI 35.01 kg/m2  SpO2 97%  Opioid Risk  Score:   Fall Risk Score:      Review of Systems  Constitutional: Positive for appetite change and unexpected weight change.  Endocrine:       High blood sugar   Musculoskeletal: Positive for back pain.  Neurological: Positive for numbness.       Tingling, spasms  Psychiatric/Behavioral: Positive for dysphoric mood.  All other systems reviewed and are negative.       Objective:   Physical Exam  Nursing note and vitals reviewed. Constitutional: He is oriented to person, place, and time. He appears well-developed.  obese  HENT:  Head: Normocephalic and atraumatic.  Eyes:  R sclera erythematous, ptosis Decreased acuity in both eyes, requires HHA in Rm   Neck: Normal range of motion.  Neurological: He is alert and oriented to person, place, and time. He displays no atrophy.  Reflex Scores:      Tricep reflexes are 1+ on the right side and  1+ on the left side.      Bicep reflexes are 1+ on the right side and 1+ on the left side.      Brachioradialis reflexes are 1+ on the right side and 1+ on the left side.      Patellar reflexes are 2+ on the right side and 2+ on the left side.      Achilles reflexes are 0 on the right side and 0 on the left side. Reduced sensation below the ankle bilaterally  Psychiatric: He has a normal mood and affect.          Assessment & Plan:  1.  Lumbar post laminectomy syndrome, chronic intermittent post op pain after infection. Will trial tramadol 50-100mg  BID prn Opioid risk 4 (low to mod) if tramadol not effective may try T#3  2.  Diabetic neuropathy with balance disorder, start outpt PT  3.  Chronic severe visual impairment contributing to balance disorder  Discussed exercise- pt would like to start stationary bike which is appropriate given visual deficits, gave name of used sporting goods store

## 2014-02-13 NOTE — Patient Instructions (Signed)
[employer] [place].

## 2014-02-20 ENCOUNTER — Telehealth: Payer: Self-pay | Admitting: *Deleted

## 2014-02-20 NOTE — Telephone Encounter (Signed)
Patient phoned requesting samples for his humalog and his jentadueto.  Samples in fridge on side A.

## 2014-02-28 ENCOUNTER — Ambulatory Visit: Payer: Medicare Other | Admitting: Physical Therapy

## 2014-03-01 ENCOUNTER — Telehealth: Payer: Self-pay | Admitting: *Deleted

## 2014-03-01 NOTE — Telephone Encounter (Signed)
ok 

## 2014-03-01 NOTE — Telephone Encounter (Signed)
Spouse phoned requesting patient's HgbA1C (7.1)  CB# [phone]

## 2014-03-01 NOTE — Telephone Encounter (Signed)
Patient phoned requesting samples of his insulin.  Humalog 75/25 in A side fridge awaiting p/u and pt notified.

## 2014-03-02 NOTE — Telephone Encounter (Signed)
Phoned and relayed A1C result to spouse per MD.

## 2014-03-17 ENCOUNTER — Ambulatory Visit: Payer: Medicare Other | Admitting: Physical Medicine & Rehabilitation

## 2014-03-31 DIAGNOSIS — Z0279 Encounter for issue of other medical certificate: Secondary | ICD-10-CM

## 2014-04-21 ENCOUNTER — Other Ambulatory Visit (HOSPITAL_COMMUNITY): Payer: Self-pay | Admitting: Internal Medicine

## 2014-04-21 ENCOUNTER — Other Ambulatory Visit: Payer: Self-pay

## 2014-04-21 MED ORDER — CARVEDILOL 12.5 MG PO TABS: 12.5000 mg | ORAL_TABLET | Freq: Two times a day (BID) | ORAL | Status: DC

## 2014-04-21 MED ORDER — LOSARTAN POTASSIUM 50 MG PO TABS: 50.0000 mg | ORAL_TABLET | Freq: Every day | ORAL | Status: DC

## 2014-04-24 ENCOUNTER — Ambulatory Visit: Payer: Medicare Other | Admitting: Physical Medicine & Rehabilitation

## 2014-04-24 ENCOUNTER — Encounter: Payer: Medicare Other | Attending: Physical Medicine & Rehabilitation

## 2014-04-24 DIAGNOSIS — E1142 Type 2 diabetes mellitus with diabetic polyneuropathy: Secondary | ICD-10-CM | POA: Insufficient documentation

## 2014-04-24 DIAGNOSIS — H547 Unspecified visual loss: Secondary | ICD-10-CM | POA: Insufficient documentation

## 2014-04-24 DIAGNOSIS — E1149 Type 2 diabetes mellitus with other diabetic neurological complication: Secondary | ICD-10-CM | POA: Insufficient documentation

## 2014-04-24 DIAGNOSIS — G8929 Other chronic pain: Secondary | ICD-10-CM | POA: Insufficient documentation

## 2014-04-24 DIAGNOSIS — M961 Postlaminectomy syndrome, not elsewhere classified: Secondary | ICD-10-CM | POA: Insufficient documentation

## 2014-04-24 DIAGNOSIS — R279 Unspecified lack of coordination: Secondary | ICD-10-CM | POA: Insufficient documentation

## 2014-05-11 ENCOUNTER — Other Ambulatory Visit: Payer: Self-pay | Admitting: *Deleted

## 2014-05-11 DIAGNOSIS — E1165 Type 2 diabetes mellitus with hyperglycemia: Principal | ICD-10-CM

## 2014-05-11 DIAGNOSIS — IMO0001 Reserved for inherently not codable concepts without codable children: Secondary | ICD-10-CM

## 2014-05-11 MED ORDER — LINAGLIPTIN-METFORMIN HCL 2.5-850 MG PO TABS: 1.0000 | ORAL_TABLET | Freq: Two times a day (BID) | ORAL | Status: DC

## 2014-05-25 ENCOUNTER — Encounter: Payer: Self-pay | Admitting: Internal Medicine

## 2014-05-25 ENCOUNTER — Ambulatory Visit (INDEPENDENT_AMBULATORY_CARE_PROVIDER_SITE_OTHER): Payer: Medicare Other | Admitting: Internal Medicine

## 2014-05-25 ENCOUNTER — Other Ambulatory Visit: Payer: Medicare Other

## 2014-05-25 ENCOUNTER — Other Ambulatory Visit: Payer: Self-pay | Admitting: *Deleted

## 2014-05-25 VITALS — BP 108/58 | HR 87 | Temp 98.7°F | Resp 12 | Wt 237.4 lb

## 2014-05-25 DIAGNOSIS — R300 Dysuria: Secondary | ICD-10-CM

## 2014-05-25 DIAGNOSIS — R350 Frequency of micturition: Secondary | ICD-10-CM

## 2014-05-25 LAB — POCT URINALYSIS DIPSTICK
Bilirubin, UA: NEGATIVE
GLUCOSE UA: NEGATIVE
Ketones, UA: NEGATIVE
NITRITE UA: NEGATIVE
PH UA: 5
Spec Grav, UA: >=1.03
UROBILINOGEN UA: 0.2

## 2014-05-25 MED ORDER — PRAVASTATIN SODIUM 40 MG PO TABS: 40.0000 mg | ORAL_TABLET | Freq: Every day | ORAL | Status: DC

## 2014-05-25 MED ORDER — NITROFURANTOIN MONOHYD MACRO 100 MG PO CAPS: 100.0000 mg | ORAL_CAPSULE | Freq: Two times a day (BID) | ORAL | Status: DC

## 2014-05-25 MED ORDER — PHENAZOPYRIDINE HCL 200 MG PO TABS: 200.0000 mg | ORAL_TABLET | Freq: Three times a day (TID) | ORAL | Status: DC | PRN

## 2014-05-25 NOTE — Patient Instructions (Signed)
Drink as much nondairy fluids as possible. Avoid spicy foods or alcohol as  these may aggravate the bladder. Do not take decongestants. Avoid narcotics if possible. 

## 2014-05-25 NOTE — Progress Notes (Signed)
Pre visit review using our clinic review tool, if applicable. No additional management support is needed unless otherwise documented below in the visit note. 

## 2014-05-25 NOTE — Progress Notes (Signed)
   Subjective:    Patient ID: [name], male    DOB: 08/09/1951, 63 y.o.   MRN: [id number]  HPI  His symptoms began yesterday as frequency and urgency.  He felt warm but his temperature was not taken.  He also has been having some dysuria  By history this is his third urinary tract infection. One was treated in the emergency room.  The EMR was reviewed. Only one urine culture result was available. That was dated 12/13/13. It revealed over 100,000 Escherichia coli which was uniformly sensitive to all antibiotics.  He has a history of allergy to furosemide and Diamox which precludes using sulfa or Flomax.  He denies a past history of prostate disease. He is a diabetic    Review of Systems  He denies hematuria or pyuria. He's had no flank pain.     Objective:   Physical Exam  Excess weight is present; is in no acute distress He is legally blind. He has no lymphadenopathy about the neck or axilla. Chest is clear with no increased work of breathing He has an S4 no significant murmur or gallops Abdomen is protuberant without tenderness or masses. He does have a well-healed abdominal operative scar There is no pain to percussion over the flanks Skin reveals no significant lesions or rashes        Assessment & Plan:  #1 frequency, urgency, dysuria in the context of history of prior urinary tract infections.  Plan see orders and recommendations

## 2014-05-28 LAB — URINE CULTURE: Colony Count: 95000

## 2014-06-21 ENCOUNTER — Telehealth: Payer: Self-pay | Admitting: *Deleted

## 2014-06-21 DIAGNOSIS — IMO0001 Reserved for inherently not codable concepts without codable children: Secondary | ICD-10-CM

## 2014-06-21 DIAGNOSIS — E1165 Type 2 diabetes mellitus with hyperglycemia: Principal | ICD-10-CM

## 2014-06-21 DIAGNOSIS — E781 Pure hyperglyceridemia: Secondary | ICD-10-CM

## 2014-06-21 NOTE — Telephone Encounter (Signed)
Patient will come by for labs.  Lipid, bemt, a1c ordered.

## 2014-06-24 ENCOUNTER — Other Ambulatory Visit: Payer: Self-pay | Admitting: Cardiology

## 2014-06-27 ENCOUNTER — Other Ambulatory Visit: Payer: Self-pay | Admitting: *Deleted

## 2014-06-27 MED ORDER — CARVEDILOL 12.5 MG PO TABS: 12.5000 mg | ORAL_TABLET | Freq: Two times a day (BID) | ORAL | Status: DC

## 2014-06-27 NOTE — Telephone Encounter (Signed)
Rx was sent to pharmacy electronically. 

## 2014-06-28 ENCOUNTER — Other Ambulatory Visit (INDEPENDENT_AMBULATORY_CARE_PROVIDER_SITE_OTHER): Payer: Medicare Other

## 2014-06-28 DIAGNOSIS — E781 Pure hyperglyceridemia: Secondary | ICD-10-CM

## 2014-06-28 DIAGNOSIS — E1165 Type 2 diabetes mellitus with hyperglycemia: Principal | ICD-10-CM

## 2014-06-28 DIAGNOSIS — IMO0001 Reserved for inherently not codable concepts without codable children: Secondary | ICD-10-CM

## 2014-06-28 LAB — LIPID PANEL
CHOL/HDL RATIO: 6
Cholesterol: 185 mg/dL (ref 0–200)
HDL: 29.8 mg/dL — ABNORMAL LOW (ref >39.00)
LDL CALC: 138 mg/dL — AB (ref 0–99)
NONHDL: 155.2
TRIGLYCERIDES: 86 mg/dL (ref 0.0–149.0)
VLDL: 17.2 mg/dL (ref 0.0–40.0)

## 2014-06-28 LAB — BASIC METABOLIC PANEL
BUN: 13 mg/dL (ref 6–23)
CALCIUM: 9.4 mg/dL (ref 8.4–10.5)
CO2: 27 mEq/L (ref 19–32)
Chloride: 106 mEq/L (ref 96–112)
Creatinine, Ser: 0.9 mg/dL (ref 0.4–1.5)
GFR: 103.63 mL/min (ref >60.00)
Glucose, Bld: 89 mg/dL (ref 70–99)
Potassium: 4.2 mEq/L (ref 3.5–5.1)
SODIUM: 139 meq/L (ref 135–145)

## 2014-06-28 LAB — HEMOGLOBIN A1C: Hgb A1c MFr Bld: 6.9 % — ABNORMAL HIGH (ref 4.6–6.5)

## 2014-07-01 ENCOUNTER — Other Ambulatory Visit (HOSPITAL_COMMUNITY): Payer: Self-pay | Admitting: Internal Medicine

## 2014-07-14 ENCOUNTER — Other Ambulatory Visit: Payer: Self-pay | Admitting: Internal Medicine

## 2014-07-16 ENCOUNTER — Other Ambulatory Visit: Payer: Self-pay | Admitting: Internal Medicine

## 2014-07-24 ENCOUNTER — Other Ambulatory Visit: Payer: Self-pay | Admitting: Cardiology

## 2014-08-01 ENCOUNTER — Other Ambulatory Visit: Payer: Self-pay

## 2014-08-01 ENCOUNTER — Other Ambulatory Visit (HOSPITAL_COMMUNITY): Payer: Self-pay | Admitting: Internal Medicine

## 2014-08-01 ENCOUNTER — Telehealth: Payer: Self-pay | Admitting: Internal Medicine

## 2014-08-01 ENCOUNTER — Telehealth: Payer: Self-pay | Admitting: Certified Registered Nurse Anesthetist

## 2014-08-01 MED ORDER — "INSULIN SYRINGE 29G X 1/2"" 1 ML MISC": Status: DC

## 2014-08-01 MED ORDER — INSULIN LISPRO PROT & LISPRO (75-25 MIX) 100 UNIT/ML ~~LOC~~ SUSP: 45.0000 [IU] | Freq: Two times a day (BID) | SUBCUTANEOUS | Status: DC

## 2014-08-01 NOTE — Telephone Encounter (Signed)
Patients wife states [facility] did not receive a month script for Humalog.  Wife is upset.

## 2014-08-01 NOTE — Telephone Encounter (Signed)
Returned call to wife, per VM unable to accept messages, pharmacy has confirmed receipt.

## 2014-08-01 NOTE — Telephone Encounter (Signed)
Received fax from pharmacy stating that pt requesting rx for humalog valves (kwikpen $1250), also need new rx for 1cc syringes.

## 2014-08-01 NOTE — Telephone Encounter (Signed)
Patient's wife called stating she was having difficulty filling her husbands insulin due to the incorrect amount being sent to the pharmacy.   Verified with [facility] that they" do " indeed have the correct insulin order.  The pharmacy tech stated that the patient does not understand that they are currently out of the insulin until tomorrow and that she would need to have the medication filled [facility].  Patient's wife was notified that she could pick up her prescription immediately at this pharmacy.  She requested to have an order for 100 unit insulin syringes placed.

## 2014-08-01 NOTE — Telephone Encounter (Signed)
Per Epic..... insulin lispro protamine-lispro (HUMALOG 75/25 MIX) (75-25) 100 UNIT/ML SUSP injection 10 mL 11 08/01/2014 Sig - Route: Inject 45-85 Units into the skin 2 (two) times daily with a meal. Use 85 units every morning and use 45 units every evening - Subcutaneous E-Prescribing Status: Receipt confirmed by pharmacy (08/01/2014 8:51 AM EDT)

## 2014-08-01 NOTE — Telephone Encounter (Signed)
Patient is requesting a month supply of humalog to be [facility].  Script patient picked up today was a 7 day supply.

## 2014-08-06 ENCOUNTER — Other Ambulatory Visit: Payer: Self-pay | Admitting: Internal Medicine

## 2014-08-21 ENCOUNTER — Other Ambulatory Visit: Payer: Self-pay

## 2014-08-21 MED ORDER — ISOSORBIDE MONONITRATE ER 60 MG PO TB24: 90.0000 mg | ORAL_TABLET | Freq: Every day | ORAL | Status: DC

## 2014-08-23 ENCOUNTER — Other Ambulatory Visit: Payer: Self-pay | Admitting: Cardiology

## 2014-09-25 ENCOUNTER — Telehealth: Payer: Self-pay

## 2014-09-25 ENCOUNTER — Other Ambulatory Visit: Payer: Self-pay | Admitting: Geriatric Medicine

## 2014-09-25 MED ORDER — OMEGA-3-ACID ETHYL ESTERS 1 G PO CAPS: ORAL_CAPSULE | ORAL | Status: DC

## 2014-09-25 NOTE — Addendum Note (Signed)
Addended by: Monico Blitz T on: 09/25/2014 02:58 PM   Modules accepted: Orders

## 2014-09-25 NOTE — Telephone Encounter (Signed)
Refill request for lovaza 1 gm cap, qty 120 w/ 1 rf, take 2 caps bid, done and sent to pharmacy

## 2014-10-02 NOTE — Telephone Encounter (Signed)
A user error has taken place jasmine open by mistake

## 2014-10-09 ENCOUNTER — Other Ambulatory Visit: Payer: Self-pay | Admitting: Cardiology

## 2014-10-25 ENCOUNTER — Ambulatory Visit (INDEPENDENT_AMBULATORY_CARE_PROVIDER_SITE_OTHER): Payer: Medicare Other | Admitting: Family

## 2014-10-25 ENCOUNTER — Encounter: Payer: Self-pay | Admitting: Family

## 2014-10-25 VITALS — BP 158/82 | HR 60 | Temp 97.4°F | Resp 18 | Ht 70.0 in | Wt 245.8 lb

## 2014-10-25 DIAGNOSIS — Z23 Encounter for immunization: Secondary | ICD-10-CM

## 2014-10-25 DIAGNOSIS — Z0000 Encounter for general adult medical examination without abnormal findings: Secondary | ICD-10-CM

## 2014-10-25 MED ORDER — PRAVASTATIN SODIUM 40 MG PO TABS: ORAL_TABLET | ORAL | Status: DC

## 2014-10-25 MED ORDER — TRAMADOL HCL 50 MG PO TABS: 50.0000 mg | ORAL_TABLET | Freq: Two times a day (BID) | ORAL | Status: DC

## 2014-10-25 NOTE — Progress Notes (Signed)
Pre visit review using our clinic review tool, if applicable. No additional management support is needed unless otherwise documented below in the visit note. 

## 2014-10-25 NOTE — Addendum Note (Signed)
Addended by: Delice Bison E on: 10/25/2014 03:36 PM   Modules accepted: Orders

## 2014-10-25 NOTE — Assessment & Plan Note (Signed)
1) Anticipatory Guidance: Discussed importance of wearing a seatbelt while driving and not texting while driving; changing batteries in smoke detector at least once annually; wearing suntan lotion when outside; eating a balanced and moderate diet; getting physical activity at least 30 minutes per day. Discussed assistance in making sure pathways are clear and free from clutter and there are no throw-rugs.   2) Immunizations / Screenings / Labs:  Given flu shot and Prevnar today. All other immunizations are up to date. / Diabetic foot exam completed exam completed. All other screening are up to date. / Pt is not fasting and wishes to wait for labs for the next visit with [name].   Obese male, with overall normal well exam. Is blind and requires assistance. Follow up with chronic diseases per [name].

## 2014-10-25 NOTE — Progress Notes (Signed)
Subjective:   Patient ID: [name], male    DOB: 08-Feb-1951, 63 y.o.   MRN: [id number]   [name] is a 63 y.o. male who presents for Medicare Annual/Subsequent preventive examination.   Preventive Screening-Counseling & Management  Tobacco History  Smoking status  . Never Smoker   Smokeless tobacco  . Never Used    Problems Prior to Visit 1.   Current Problems (verified) Patient Active Problem List   Diagnosis Date Noted  . Diabetic neuropathy 02/13/2014  . Postlaminectomy syndrome, lumbar region 02/13/2014  . Claudication in peripheral vascular disease 12/28/2013  . Chronic pain syndrome 12/28/2013  . GERD (gastroesophageal reflux disease) 08/25/2013  . Pure hyperglyceridemia 08/25/2013  . Neurogenic bladder 10/01/2012  . Pure hypercholesterolemia 04/02/2012  . Routine general medical examination at a health care facility 04/02/2012  . B12 deficiency anemia 03/31/2012  . BPH (benign prostatic hyperplasia) 03/31/2012  . Obesity 05/26/2011  . ED (erectile dysfunction) 05/26/2011  . HYPERTENSION, BENIGN ESSENTIAL 12/18/2010  . Type II or unspecified type diabetes mellitus without mention of complication, uncontrolled 08/28/2010  . CORONARY ARTERY DISEASE 08/28/2010  . CONGESTIVE HEART FAILURE 08/28/2010  . LOW BACK PAIN 08/28/2010   Current Outpatient Prescriptions on File Prior to Visit  Medication Sig Dispense Refill  . carvedilol (COREG) 12.5 MG tablet TAKE 1 TABLET (12.5 MG TOTAL) BY MOUTH 2 (TWO) TIMES DAILY WITH A MEAL. 60 tablet 0  . dorzolamide-timolol (COSOPT) 22.3-6.8 MG/ML ophthalmic solution Place 1 drop into the right eye 2 (two) times daily. 10 mL 1  . HUMALOG MIX 75/25 (75-25) 100 UNIT/ML SUSP injection INJECT 85 UNITS EVERY MORNING AND 45 UNITS EVERY EVENING. 100 mL 11  . insulin lispro protamine-lispro (HUMALOG 75/25 MIX) (75-25) 100 UNIT/ML SUSP injection Inject 45-85 Units into the skin 2 (two) times daily with a meal. Use 85 units every  morning and use 45 units every evening 100 mL 11  . INSULIN SYRINGE 1CC/29G (B-D INSULIN SYRINGE) 29G X 1/2" 1 ML MISC Use as directed with insulin vials 100 each 11  . isosorbide mononitrate (IMDUR) 60 MG 24 hr tablet Take 1.5 tablets (90 mg total) by mouth daily. 30 tablet 6  . JENTADUETO 2.5-850 MG TABS TAKE 1 TABLET BY MOUTH 2 (TWO) TIMES DAILY. 180 tablet 3  . losartan (COZAAR) 50 MG tablet Take 1 tablet (50 mg total) by mouth daily. 30 tablet 6  . omega-3 acid ethyl esters (LOVAZA) 1 G capsule TAKE 2 CAPSULES TWICE A DAY 120 capsule 1  . phenazopyridine (PYRIDIUM) 200 MG tablet Take 1 tablet (200 mg total) by mouth 3 (three) times daily as needed for pain. 10 tablet 0  . pravastatin (PRAVACHOL) 40 MG tablet (NEED OFFICE VISIT) TAKE 1 TABLET (40 MG TOTAL) BY MOUTH DAILY. DAILY 30 tablet 4  . traMADol (ULTRAM) 50 MG tablet Take 1 tablet (50 mg total) by mouth 2 (two) times daily. 120 tablet 1   No current facility-administered medications on file prior to visit.    Allergies (verified) Furosemide; Diamox; and Lisinopril   PAST HISTORY  Family History Family History  Problem Relation Age of Onset  . Breast cancer Mother   . Colon cancer Mother   . Colon cancer Other     Elevated Risk for  . Hypertension Mother   . Hypertension Father   . Hypertension Father   . Diabetes Mother   . Diabetes Sister   . Diabetes Brother     Social History History  Substance Use Topics  . Smoking status: Never Smoker   . Smokeless tobacco: Never Used  . Alcohol Use: No    Are there smokers in your home (other than you)?  No  Risk Factors Current exercise habits: Not currently exercising secondary to back pain. And not able to do anything the outside because he is blind.  Dietary issues discussed:  Cardiac risk factors: advanced age (older than 73 for men, 49 for women), diabetes mellitus, dyslipidemia, hypertension, male gender, obesity (BMI >= 30 kg/m2) and sedentary  lifestyle.  Depression Screen (Note: if answer to either of the following is "Yes", a more complete depression screening is indicated)   Q1: Over the past two weeks, have you felt down, depressed or hopeless? No  Q2: Over the past two weeks, have you felt little interest or pleasure in doing things? No  Have you lost interest or pleasure in daily life? No  Do you often feel hopeless? No  Do you cry easily over simple problems? No  Activities of Daily Living In your present state of health, do you have any difficulty performing the following activities?:  Driving? Yes - patient is blind Managing money?  No Feeding yourself? No Getting from bed to chair? No Climbing a flight of stairs? No Preparing food and eating?: No Bathing or showering? No Getting dressed: No Getting to the toilet? No Using the toilet:No Moving around from place to place: No In the past year have you fallen or had a near fall?:No  Are you sexually active?  Yes  Do you have more than one partner? No  Hearing Difficulties:  Do you often ask people to speak up or repeat themselves? {No Do you experience ringing or noises in your ears? No Do you have difficulty understanding soft or whispered voices? No  Do you feel that you have a problem with memory? Yes - hard time remembering where he placed items  Do you often misplace items? Yes - as above  Do you feel safe at home?  Yes  Cognitive Testing  Alert? Yes Normal Appearance? Yes  Oriented to person? Yes  Place? Yes   Time? Yes  Recall of three objects?  Yes  Can perform simple calculations? Yes  Displays appropriate judgment? Yes  Can read the correct time from a watch face?No - patient is blind   Advanced Directives have been discussed with the patient? Does not currently have a living will. Has means to get around and do it.   List the Names of Other Physician/Practitioners you currently use: 1.  Dr. Lacie Draft - Cardiology 2. Dr. Minus Breeding - [facility] 3. Dr. Sandrea Hammond - Glaucoma Dr.  4. Dr. [name] - PCP  Indicate any recent Medical Services you may have received from other than [facility] providers in the past year (date may be approximate).  Immunization History  Administered Date(s) Administered  . Influenza Split 09/12/2012  . Influenza Whole 10/28/2010, 10/28/2011  . Influenza, High Dose Seasonal PF 08/25/2013  . Pneumococcal Polysaccharide-23 10/28/2010, 09/13/2012  . Td 10/22/2010    Screening Tests Health Maintenance  Topic Date Due  . URINE MICROALBUMIN  08/19/1959  . OPHTHALMOLOGY EXAM  04/01/2014  . FOOT EXAM  04/04/2014  . INFLUENZA VACCINE  07/22/2014  . ZOSTAVAX  12/28/2014 (Originally 08/18/2009)  . HEMOGLOBIN A1C  12/29/2014  . TETANUS/TDAP  10/22/2020  . COLONOSCOPY  10/30/2020  . PNEUMOCOCCAL POLYSACCHARIDE VACCINE AGE 66 AND OVER  Completed    All answers  were reviewed with the patient and necessary referrals were made:  Mauricio Po, FNP   10/25/2014   History reviewed: Review of Systems Constitutional: Denies fever, chills, fatigue, or significant weight gain/loss. HENT: Head: Denies headache or neck pain Ears: Denies changes in hearing, ringing in ears, earache, drainage Nose: Denies discharge, stuffiness, itching, nosebleed, sinus pain Throat: Denies sore throat, hoarseness, dry mouth, sores, thrush Eyes: Blind Cardiovascular: Denies chest pain/discomfort, tightness, palpitations, shortness of breath with activity, difficulty lying down, swelling, sudden awakening with shortness of breath Respiratory: Denies shortness of breath, cough, sputum production, wheezing Gastrointestinal: Denies dysphasia, heartburn, change in appetite, nausea, change in bowel habits, rectal bleeding, constipation, diarrhea, yellow skin or eyes Genitourinary: Denies frequency, urgency, burning/pain, blood in urine, incontinence Weak stream - with no difficulty initiating a stream.  Musculoskeletal: Denies  muscle/joint pain, stiffness, back pain, redness or swelling of joints, trauma Skin: Denies rashes, lumps, itching, dryness, color changes, or hair/nail changes Neurological: Denies dizziness, fainting, seizures, weakness, numbness, tingling, tremor Psychiatric - Denies nervousness, stress, depression or memory loss Endocrine: Denies heat or cold intolerance, sweating, frequent urination, excessive thirst, changes in appetite Hematologic: Denies ease of bruising or bleeding   Objective:     Blood pressure 158/82, pulse 60, temperature 97.4 F (36.3 C), temperature source Oral, resp. rate 18, height 5\' 10"  (1.778 m), weight 245 lb 12.8 oz (111.494 kg), SpO2 96 %. Body mass index is 35.27 kg/(m^2).  Physical Exam  Constitutional: He is oriented to person, place, and time and well-developed, well-nourished, and in no distress. No distress.  HENT:  Head: Normocephalic.  Right Ear: Hearing, tympanic membrane, external ear and ear canal normal.  Left Ear: Hearing, tympanic membrane, external ear and ear canal normal.  Nose: Nose normal.  Mouth/Throat: Uvula is midline, oropharynx is clear and moist and mucous membranes are normal.  Neck: No JVD present. No tracheal deviation present. No thyromegaly present.  Cardiovascular: Normal rate, regular rhythm and normal heart sounds.   Pulmonary/Chest: Effort normal and breath sounds normal.  Abdominal: Soft. Bowel sounds are normal. He exhibits no distension and no mass. There is no tenderness. There is no rebound and no guarding.  Lymphadenopathy:    He has no cervical adenopathy.  Neurological: He is alert and oriented to person, place, and time. He has normal reflexes. No cranial nerve deficit. Coordination normal.  Skin: Skin is warm and dry.  Psychiatric: Mood, memory, affect and judgment normal.       Assessment:          Plan:     During the course of the visit the patient was educated and counseled about appropriate screening  and preventive services including:    Pneumococcal vaccine   Influenza vaccine  Nutrition counseling   Diet review for nutrition referral? Yes ____  Not Indicated _X___   Patient Instructions (the written plan) was given to the patient.  Medicare Attestation I have personally reviewed: The patient's medical and social history Their use of alcohol, tobacco or illicit drugs Their current medications and supplements The patient's functional ability including ADLs,fall risks, home safety risks, cognitive, and hearing and visual impairment Diet and physical activities Evidence for depression or mood disorders  The patient's weight, height, BMI, and visual acuity have been recorded in the chart.  I have made referrals, counseling, and provided education to the patient based on review of the above and I have provided the patient with a written personalized care plan for preventive services.  Mauricio Po, FNP   10/25/2014

## 2014-10-25 NOTE — Patient Instructions (Addendum)
Thank you for choosing [facility].  Summary/Instructions:  Please work to continue eating fruits, vegetable and fiber Choose low-fat lean cuts of meat and fish  Continue to work on increasing your activity as able with your back.   Follow up with [name] for your diabetes, hypertension, and hyperlipidemia.

## 2014-11-03 ENCOUNTER — Other Ambulatory Visit: Payer: Self-pay | Admitting: Cardiology

## 2014-11-26 ENCOUNTER — Other Ambulatory Visit: Payer: Self-pay | Admitting: Internal Medicine

## 2014-11-27 ENCOUNTER — Other Ambulatory Visit: Payer: Self-pay | Admitting: Cardiology

## 2014-12-01 ENCOUNTER — Other Ambulatory Visit: Payer: Self-pay | Admitting: *Deleted

## 2014-12-01 ENCOUNTER — Other Ambulatory Visit: Payer: Self-pay

## 2014-12-01 MED ORDER — LOSARTAN POTASSIUM 50 MG PO TABS: 50.0000 mg | ORAL_TABLET | Freq: Every day | ORAL | Status: DC

## 2014-12-08 ENCOUNTER — Other Ambulatory Visit: Payer: Self-pay | Admitting: Cardiology

## 2014-12-13 ENCOUNTER — Other Ambulatory Visit: Payer: Self-pay | Admitting: Cardiology

## 2014-12-14 NOTE — Telephone Encounter (Signed)
Rx refill denied to patient pharmacy

## 2014-12-19 ENCOUNTER — Telehealth: Payer: Self-pay

## 2014-12-19 ENCOUNTER — Other Ambulatory Visit: Payer: Self-pay | Admitting: Cardiology

## 2014-12-19 NOTE — Telephone Encounter (Signed)
Pt wife called in being rude and upset because her husband did not get a refill for his coreg--explained to her that without an appointment scheduled I can not refill coreg. She got mad because I couldn't refill medication. I stated I would not continue to try and help her if she kept being rude to me-- she calmed down and apologized and I explained I would transfer her to scheduling to make a follow up appointment and then she could be transferred back to refill department to get refill up to appointment date. She stated her understanding and was agreeable to this.

## 2014-12-19 NOTE — Telephone Encounter (Signed)
Elijah Furbish, MD at 11/14/2013 1:59 PM  carvedilol (COREG) 12.5 MG tablet  Take 1 tablet (12.5 mg total) by mouth 2 (two) times daily with a meal  Patient Instructions:   Your physician recommends that you continue on your current medications as directed. Please refer to the Current Medication list given to you today.

## 2014-12-20 ENCOUNTER — Other Ambulatory Visit: Payer: Self-pay

## 2014-12-20 MED ORDER — CARVEDILOL 12.5 MG PO TABS: ORAL_TABLET | ORAL | Status: DC

## 2014-12-20 NOTE — Telephone Encounter (Signed)
Patient 's wife called  Wanting a  30 day refill for the patient I explain to her that the reason why her husband  got only 8 tab was because  He needed an appointment. , she stated that  The person  she talked to yesterday told her after she made the appointment that a 30 day supply would be sent in, and she stated that it was the person who made the appointment. I explained to her that the person should have not told her that because they probably did not know our protocol for refills. i told her that I would send in a 30 days supply but if he did not show up for his appointment on 12/27/14 he would run into the same problem because he had not been here and we had been placing it on his refills since June 2015

## 2014-12-27 ENCOUNTER — Ambulatory Visit (INDEPENDENT_AMBULATORY_CARE_PROVIDER_SITE_OTHER): Payer: Self-pay | Admitting: Cardiology

## 2014-12-27 ENCOUNTER — Encounter: Payer: Self-pay | Admitting: Cardiology

## 2014-12-27 VITALS — BP 134/70 | HR 75 | Ht 70.0 in | Wt 243.0 lb

## 2014-12-27 DIAGNOSIS — E7800 Pure hypercholesterolemia, unspecified: Secondary | ICD-10-CM

## 2014-12-27 DIAGNOSIS — I2510 Atherosclerotic heart disease of native coronary artery without angina pectoris: Secondary | ICD-10-CM

## 2014-12-27 DIAGNOSIS — E669 Obesity, unspecified: Secondary | ICD-10-CM

## 2014-12-27 DIAGNOSIS — I10 Essential (primary) hypertension: Secondary | ICD-10-CM

## 2014-12-27 MED ORDER — ISOSORBIDE MONONITRATE ER 120 MG PO TB24: 120.0000 mg | ORAL_TABLET | Freq: Every day | ORAL | Status: DC

## 2014-12-27 MED ORDER — LOSARTAN POTASSIUM 50 MG PO TABS: 50.0000 mg | ORAL_TABLET | Freq: Every day | ORAL | Status: DC

## 2014-12-27 MED ORDER — PRAVASTATIN SODIUM 40 MG PO TABS: ORAL_TABLET | ORAL | Status: DC

## 2014-12-27 NOTE — Patient Instructions (Signed)
Your physician recommends that you continue on your current medications as directed. Please refer to the Current Medication list given to you today.  Your medications have been refilled today  Your physician wants you to follow-up in: 6 months with Dr.Skains You will receive a reminder letter in the mail two months in advance. If you don't receive a letter, please call our office to schedule the follow-up appointment.

## 2014-12-27 NOTE — Progress Notes (Signed)
[facility]. [address], [address], [place]  [address] Phone: [phone] Fax:  [phone]  Date:  12/27/2014   ID:  [name], [place] 04/01/1951, MRN [id number]  PCP:  [name], MD   History of Present Illness: [name] is a 64 y.o. male male with a hx of diabetes, hypertension, hyperlipidemia, obesity, blindness, sleep apnea, angina, coronary artery disease.  Cardiac cath 10/2007 with PCI and BMS to proximal left circumflex by Dr Leonia Reeves. He has been noncompliant in the past with follow up and at pror visit ran out of his medications. He saw Dr [name] on 04/02/12, who restarted medications.  At prior visit had long discussion at last visit about the importance of his medications.  Currently he is doing very well. No complaints, no chest pain. If does not take meds has chest burning.   Discussed his blindness.    Wt Readings from Last 3 Encounters:  12/27/14 243 lb (110.224 kg)  10/25/14 245 lb 12.8 oz (111.494 kg)  05/25/14 237 lb 6.4 oz (107.684 kg)     Past Medical History  Diagnosis Date  . Glaucoma   . Ulcer   . HTN (hypertension)   . Hypercholesterolemia   . Diabetes mellitus     Type II  . CHF (congestive heart failure)   . CAD (coronary artery disease)   . LBP (low back pain)   . Legally blind     Past Surgical History  Procedure Laterality Date  . Prosthetic cornea placement, right eye  2007    [facility]  . Lumbar fusion    . Peptic ulcer dz surgery  pt was in his 20s    bleeding ulcer.   . Inguinal hernia repair      right  . Cholecystectomy  09/15/2012    Procedure: LAPAROSCOPIC CHOLECYSTECTOMY WITH INTRAOPERATIVE CHOLANGIOGRAM;  Surgeon: Pedro Earls, MD;  Location: [facility];  Service: General;  Laterality: N/A;    Current Outpatient Prescriptions  Medication Sig Dispense Refill  . carvedilol (COREG) 12.5 MG tablet TAKE 1 TABLET (12.5 MG TOTAL) BY MOUTH 2 (TWO) TIMES DAILY WITH A MEAL. 60 tablet 0  . dorzolamide-timolol  (COSOPT) 22.3-6.8 MG/ML ophthalmic solution Place 1 drop into the right eye 2 (two) times daily. 10 mL 1  . insulin lispro protamine-lispro (HUMALOG 75/25 MIX) (75-25) 100 UNIT/ML SUSP injection Inject 45-85 Units into the skin 2 (two) times daily with a meal. Use 85 units every morning and use 45 units every evening 100 mL 11  . INSULIN SYRINGE 1CC/29G (B-D INSULIN SYRINGE) 29G X 1/2" 1 ML MISC Use as directed with insulin vials 100 each 11  . isosorbide mononitrate (IMDUR) 60 MG 24 hr tablet Take 1.5 tablets (90 mg total) by mouth daily. 30 tablet 6  . JENTADUETO 2.5-850 MG TABS TAKE 1 TABLET BY MOUTH 2 (TWO) TIMES DAILY. 180 tablet 3  . losartan (COZAAR) 50 MG tablet Take 1 tablet (50 mg total) by mouth daily. 15 tablet 0  . omega-3 acid ethyl esters (LOVAZA) 1 G capsule TAKE 2 CAPSULES TWICE A DAY 120 capsule 1  . phenazopyridine (PYRIDIUM) 200 MG tablet Take 1 tablet (200 mg total) by mouth 3 (three) times daily as needed for pain. 10 tablet 0  . pravastatin (PRAVACHOL) 40 MG tablet (NEED OFFICE VISIT) TAKE 1 TABLET (40 MG TOTAL) BY MOUTH DAILY. DAILY 30 tablet 4  . traMADol (ULTRAM) 50 MG tablet Take 1 tablet (50 mg  total) by mouth 2 (two) times daily. 60 tablet 0   No current facility-administered medications for this visit.    Allergies:    Allergies  Allergen Reactions  . Furosemide     pancreatitis  . Diamox [Acetazolamide] Itching  . Lisinopril Rash    Social History:  The patient  reports that he has never smoked. He has never used smokeless tobacco. He reports that he does not drink alcohol.   ROS:  Please see the history of present illness.   No fevers, no chills, no orthopnea, no PND   PHYSICAL EXAM: VS:  BP 134/70 mmHg  Pulse 75  Ht 5\' 10"  (1.778 m)  Wt 243 lb (110.224 kg)  BMI 34.87 kg/m2 Well nourished, well developed, in no acute distress HEENT: Blind Neck: no JVD Cardiac:  normal S1, S2; RRR; no murmur Lungs:  clear to auscultation bilaterally, no wheezing,  rhonchi or rales Abd: soft, nontender, no hepatomegalyObese Ext: no edema Skin: warm and dry Neuro: no focal abnormalities noted  EKG:  12/27/14-sinus rhythm, 75, poor R-wave progression  ASSESSMENT AND PLAN:  1. Coronary artery disease-no chest pain. I will change his isosorbide to 120 mg once a day for ease of use. Previously he was trying to split tablets. 2. Diabetes- [name] managing. Hemoglobin A1c 6.9. 3. Obesity-continue to encourage weight loss. Decrease carbohydrates. 4. Hyperlipidemia-continue with statin. Refills given. LDL goal less than 100/70. 5. Blindness-no changes 6. 6 month follow-up  Signed, Candee Furbish, MD [facility]  12/27/2014 3:54 PM

## 2015-01-01 ENCOUNTER — Other Ambulatory Visit: Payer: Self-pay | Admitting: Cardiology

## 2015-02-14 DIAGNOSIS — T85398A Other mechanical complication of other ocular prosthetic devices, implants and grafts, initial encounter: Secondary | ICD-10-CM | POA: Insufficient documentation

## 2015-02-17 ENCOUNTER — Other Ambulatory Visit: Payer: Self-pay | Admitting: Cardiology

## 2015-02-19 ENCOUNTER — Other Ambulatory Visit: Payer: Self-pay | Admitting: Internal Medicine

## 2015-03-03 ENCOUNTER — Encounter (HOSPITAL_COMMUNITY): Payer: Self-pay | Admitting: Emergency Medicine

## 2015-03-03 ENCOUNTER — Emergency Department (HOSPITAL_COMMUNITY): Payer: Medicare Other

## 2015-03-03 ENCOUNTER — Emergency Department (HOSPITAL_COMMUNITY)
Admission: EM | Admit: 2015-03-03 | Discharge: 2015-03-03 | Disposition: A | Discharge status: Home / Self Care | Payer: Medicare Other | Attending: Emergency Medicine | Admitting: Emergency Medicine

## 2015-03-03 DIAGNOSIS — I509 Heart failure, unspecified: Secondary | ICD-10-CM | POA: Diagnosis not present

## 2015-03-03 DIAGNOSIS — Z79899 Other long term (current) drug therapy: Secondary | ICD-10-CM | POA: Diagnosis not present

## 2015-03-03 DIAGNOSIS — G8929 Other chronic pain: Secondary | ICD-10-CM | POA: Diagnosis not present

## 2015-03-03 DIAGNOSIS — M549 Dorsalgia, unspecified: Secondary | ICD-10-CM | POA: Diagnosis present

## 2015-03-03 DIAGNOSIS — I10 Essential (primary) hypertension: Secondary | ICD-10-CM | POA: Diagnosis not present

## 2015-03-03 DIAGNOSIS — E780 Pure hypercholesterolemia: Secondary | ICD-10-CM | POA: Insufficient documentation

## 2015-03-03 DIAGNOSIS — I2510 Atherosclerotic heart disease of native coronary artery without angina pectoris: Secondary | ICD-10-CM | POA: Diagnosis not present

## 2015-03-03 DIAGNOSIS — M25551 Pain in right hip: Secondary | ICD-10-CM | POA: Insufficient documentation

## 2015-03-03 DIAGNOSIS — Z794 Long term (current) use of insulin: Secondary | ICD-10-CM | POA: Insufficient documentation

## 2015-03-03 DIAGNOSIS — H548 Legal blindness, as defined in USA: Secondary | ICD-10-CM | POA: Diagnosis not present

## 2015-03-03 DIAGNOSIS — M545 Low back pain, unspecified: Secondary | ICD-10-CM

## 2015-03-03 DIAGNOSIS — M25552 Pain in left hip: Secondary | ICD-10-CM | POA: Insufficient documentation

## 2015-03-03 DIAGNOSIS — M160 Bilateral primary osteoarthritis of hip: Secondary | ICD-10-CM

## 2015-03-03 DIAGNOSIS — M25559 Pain in unspecified hip: Secondary | ICD-10-CM

## 2015-03-03 DIAGNOSIS — E119 Type 2 diabetes mellitus without complications: Secondary | ICD-10-CM | POA: Insufficient documentation

## 2015-03-03 DIAGNOSIS — M62830 Muscle spasm of back: Secondary | ICD-10-CM | POA: Insufficient documentation

## 2015-03-03 MED ORDER — TRAMADOL HCL 50 MG PO TABS
50.0000 mg | ORAL_TABLET | Freq: Once | ORAL | Status: AC
  Administered 2015-03-03: 50 mg via ORAL
  Filled 2015-03-03: qty 1

## 2015-03-03 MED ORDER — TRAMADOL HCL 50 MG PO TABS: 50.0000 mg | ORAL_TABLET | Freq: Two times a day (BID) | ORAL | Status: DC | PRN

## 2015-03-03 NOTE — ED Provider Notes (Signed)
CSN: [id number]     Arrival date & time 03/03/15  1544 History   First MD Initiated Contact with Patient 03/03/15 1720    This chart was scribed for non-physician practitioner, Zacarias Pontes, working with Alfonzo Beers, MD by Terressa Koyanagi, ED Scribe. This patient was seen in room TR09C/TR09C and the patient's care was started at 5:21 PM.  Chief Complaint  Patient presents with  . Back Pain  . Hip Pain  . Leg Pain   Patient is a 64 y.o. male presenting with back pain, hip pain, and leg pain. The history is provided by the patient. No language interpreter was used.  Back Pain Location:  Lumbar spine Quality: throbbing. Radiates to:  L posterior upper leg, L thigh, L knee and L foot Pain severity:  Severe Pain is:  Same all the time Onset quality:  Gradual Duration:  1 day Timing:  Constant Progression:  Unchanged Chronicity:  Recurrent Context: not emotional stress, not falling, not jumping from heights, not lifting heavy objects, not MCA, not MVA, not occupational injury, not pedestrian accident, not physical stress, not recent illness, not recent injury and not twisting   Relieved by:  Nothing Worsened by:  Ambulation Ineffective treatments:  Ibuprofen and OTC medications Associated symptoms: leg pain   Associated symptoms: no abdominal pain, no bladder incontinence, no bowel incontinence, no chest pain, no dysuria, no fever, no headaches, no numbness, no paresthesias, no pelvic pain, no perianal numbness, no tingling and no weakness   Risk factors: no hx of cancer   Hip Pain Pertinent negatives include no chest pain, no abdominal pain, no headaches and no shortness of breath.  Leg Pain Associated symptoms: back pain (back pain radiating down to the right hip, leg and to the right ankle)   Associated symptoms: no fever and no neck pain    PCP: [name], MD HPI Comments: KENON DELASHMIT is a 64 y.o. male, with PMH noted below including blindness, DM, and back surgery  with complications, who presents to the Emergency Department complaining of atraumatic, gradually worsening, constant lower R sided back pain radiating down to the right hip, leg, and to the right ankle worsening since yesterday around 1pm. Pt describes the pain as a throbbing pain and rates it an 8 out of 10. Pt reports taking Advil at home without relief. Pt denies fever, chest pain, SOB, abd pain, n/v, diarrhea, constipation, new numbness/tingling, weakness, HA, Hx of cancer, Hx of IV drug use, change in activity. Pt is unsure of whether he had any episodes of hematuria due to blindness. Pt notes that he is able to ambulate but it has been difficult for the past day and he has to use his cane-- at baseline, pt is able to ambulate without assistance of the cane.    Past Medical History  Diagnosis Date  . Glaucoma   . Ulcer   . HTN (hypertension)   . Hypercholesterolemia   . Diabetes mellitus     Type II  . CHF (congestive heart failure)   . CAD (coronary artery disease)   . LBP (low back pain)   . Legally blind    Past Surgical History  Procedure Laterality Date  . Prosthetic cornea placement, right eye  2007    [facility]  . Lumbar fusion    . Peptic ulcer dz surgery  pt was in his 20s    bleeding ulcer.   . Inguinal hernia repair      right  .  Cholecystectomy  09/15/2012    Procedure: LAPAROSCOPIC CHOLECYSTECTOMY WITH INTRAOPERATIVE CHOLANGIOGRAM;  Surgeon: Pedro Earls, MD;  Location: [place];  Service: General;  Laterality: N/A;   Family History  Problem Relation Age of Onset  . Breast cancer Mother   . Colon cancer Mother   . Colon cancer Other     Elevated Risk for  . Hypertension Mother   . Hypertension Father   . Hypertension Father   . Diabetes Mother   . Diabetes Sister   . Diabetes Brother    History  Substance Use Topics  . Smoking status: Never Smoker   . Smokeless tobacco: Never Used  . Alcohol Use: No    A complete 10 system review of systems was  obtained and all systems are negative except as noted in the HPI and PMH.   Review of Systems  Constitutional: Negative for fever and chills.  Respiratory: Negative for shortness of breath.   Cardiovascular: Negative for chest pain.  Gastrointestinal: Negative for nausea, vomiting, abdominal pain, diarrhea, constipation and bowel incontinence.  Genitourinary: Negative for bladder incontinence, dysuria, flank pain and pelvic pain. Hematuria: unble to report.  Musculoskeletal: Positive for myalgias (R leg), back pain (back pain radiating down to the right hip, leg and to the right ankle) and arthralgias (R hip). Negative for joint swelling and neck pain.  Allergic/Immunologic: Positive for immunocompromised state (diabetic).  Neurological: Negative for tingling, weakness, numbness, headaches and paresthesias.  Psychiatric/Behavioral: Negative for confusion.  10 Systems reviewed and are negative for acute change except as noted in the HPI.    Allergies  Furosemide; Diamox; and Lisinopril  Home Medications   Prior to Admission medications   Medication Sig Start Date End Date Taking? Authorizing Provider  carvedilol (COREG) 12.5 MG tablet TAKE 1 TABLET (12.5 MG TOTAL) BY MOUTH 2 (TWO) TIMES DAILY WITH A MEAL. 02/19/15   Jerline Pain, MD  dorzolamide-timolol (COSOPT) 22.3-6.8 MG/ML ophthalmic solution Place 1 drop into the right eye 2 (two) times daily. 09/16/12   Janece Canterbury, MD  insulin lispro protamine-lispro (HUMALOG 75/25 MIX) (75-25) 100 UNIT/ML SUSP injection Inject 45-85 Units into the skin 2 (two) times daily with a meal. Use 85 units every morning and use 45 units every evening 08/01/14   [name], MD  INSULIN SYRINGE 1CC/29G (B-D INSULIN SYRINGE) 29G X 1/2" 1 ML MISC Use as directed with insulin vials 08/01/14   [name], MD  isosorbide mononitrate (IMDUR) 120 MG 24 hr tablet Take 1 tablet (120 mg total) by mouth daily. 12/27/14   Jerline Pain, MD  JENTADUETO 2.5-850 MG  TABS TAKE 1 TABLET BY MOUTH 2 (TWO) TIMES DAILY. 08/07/14   [name], MD  losartan (COZAAR) 50 MG tablet Take 1 tablet (50 mg total) by mouth daily. 12/27/14   Jerline Pain, MD  omega-3 acid ethyl esters (LOVAZA) 1 G capsule TAKE 2 CAPSULES TWICE A DAY 02/19/15   [name], MD  phenazopyridine (PYRIDIUM) 200 MG tablet Take 1 tablet (200 mg total) by mouth 3 (three) times daily as needed for pain. 05/25/14   Hendricks Limes, MD  pravastatin (PRAVACHOL) 40 MG tablet TAKE 1 TABLET (40 MG TOTAL) BY MOUTH DAILY. DAILY 12/27/14   Jerline Pain, MD  traMADol (ULTRAM) 50 MG tablet Take 1 tablet (50 mg total) by mouth 2 (two) times daily. 10/25/14   [place], FNP   Triage Vitals: BP 162/86 mmHg  Pulse 60  Temp(Src)  98.2 F (36.8 C) (Oral)  Resp 20  Ht 5\' 11"  (1.803 m)  Wt 249 lb (112.946 kg)  BMI 34.74 kg/m2  SpO2 100% Physical Exam  Constitutional: He is oriented to person, place, and time. Vital signs are normal. He appears well-developed and well-nourished.  Non-toxic appearance. No distress.  Afebrile, nontoxic, NAD  HENT:  Head: Normocephalic and atraumatic.  Mouth/Throat: Mucous membranes are normal.  Eyes: Conjunctivae and EOM are normal. Right eye exhibits no discharge. Left eye exhibits no discharge.  Neck: Normal range of motion. Neck supple.  Cardiovascular: Normal rate.   Pulmonary/Chest: Effort normal. No respiratory distress.  Abdominal: Normal appearance. He exhibits no distension.  Musculoskeletal:       Right hip: He exhibits decreased range of motion (due to pain) and tenderness. He exhibits normal strength, no bony tenderness, no swelling, no crepitus and no deformity.       Right upper leg: He exhibits tenderness. He exhibits no bony tenderness, no swelling, no edema and no deformity.       Legs: Right-sided paraspinous muscles in the lumbar region with mild tenderness to palpation and spasm, extending into the gluteal area and down the posterior thigh. No  midline bony spinal tenderness palpation.  Mild joint line tenderness to palpation in the right hip with no crepitus, deformity, swelling, or focal bony tenderness. No erythema. Range of motion limited due to pain. Positive straight leg raise of the right leg. Strength 5/5 in all distal extremities, sensation grossly intact in all extremities, gait antalgic favoring the left leg, but bearing some weight into R hip.  Neurological: He is alert and oriented to person, place, and time. He has normal strength. No sensory deficit. Gait (antalgic) abnormal.  Skin: Skin is warm, dry and intact. No rash noted.  Psychiatric: He has a normal mood and affect. His behavior is normal.  Nursing note and vitals reviewed.   ED Course  Procedures (including critical care time) DIAGNOSTIC STUDIES: Oxygen Saturation is 100% on RA, nl by my interpretation.    COORDINATION OF CARE: 5:30 PM-Discussed treatment plan with pt at bedside and pt agreed to plan.   Labs Review Labs Reviewed - No data to display  Imaging Review Dg Hip Unilat With Pelvis 2-3 Views Right  03/03/2015   CLINICAL DATA:  Patient states he started hurting in his right hip X 3 days ago. Patient states the pain is located in the posterior portion of the right hip and radiates inferiorly throughout the right leg. Patient has had no recent injury to the right hip and no surgeries to the right hip.  EXAM: RIGHT HIP (WITH PELVIS) 2-3 VIEWS  COMPARISON:  09/11/2012  FINDINGS: Examination demonstrates mild diffuse decreased bone mineralization. There are mild symmetric degenerative changes of the hips. Degenerative changes of the spine and sacroiliac joints. No acute fracture or dislocation.  IMPRESSION: No acute findings.  Mild symmetric degenerative changes of the hips.   Electronically Signed   By: Marin Olp M.D.   On: 03/03/2015 18:37     EKG Interpretation None      MDM   Final diagnoses:  Hip pain  Chronic low back pain  Bilateral hip  joint arthritis  Muscle spasm of back    64 y.o. male with low back pain and hip pain. No red flag s/s of low back pain. No s/s of central cord compression or cauda equina. Lower extremities are neurovascularly intact and patient is ambulating with cane, mildly antalgic. Given his  age, will proceed with imaging of hip since pain seems to be mostly in hip joint and in surrounding musculature.  7:23 PM Hip xray showing b/l hip degenerative changes. Will proceed with heat therapy, NSAIDs/tylenol, and give tramadol. Will have him f/up with PCP. Patient was counseled on back pain precautions and told to do activity as tolerated but do not lift, push, or pull heavy objects more than 10 pounds for the next week. Patient counseled to use ice or heat on back for no longer than 15 minutes every hour.  Patient urged to follow-up with PCP if pain does not improve with treatment and rest or if pain becomes recurrent. Urged to return with worsening severe pain, loss of bowel or bladder control, trouble walking. The patient verbalizes understanding and agrees with the plan.   I personally performed the services described in this documentation, which was scribed in my presence. The recorded information has been reviewed and is accurate.  BP 162/86 mmHg  Pulse 60  Temp(Src) 98.2 F (36.8 C) (Oral)  Resp 20  Ht 5\' 11"  (1.803 m)  Wt 249 lb (112.946 kg)  BMI 34.74 kg/m2  SpO2 100%  Meds ordered this encounter  Medications  . traMADol (ULTRAM) tablet 50 mg    Sig:   . traMADol (ULTRAM) 50 MG tablet    Sig: Take 1 tablet (50 mg total) by mouth every 12 (twelve) hours as needed for severe pain.    Dispense:  30 tablet    Refill:  0    Order Specific Question:  Supervising Provider    Answer:  Noemi Chapel [[id number]]     Heliodoro Domagalski Camprubi-Soms, PA-C 03/03/15 [place], MD 03/03/15 [phone]

## 2015-03-03 NOTE — ED Notes (Signed)
Pt c/o low back pain that radiates down right hip to feet. Family reports that pt has chronic pain but has increased yesterday.

## 2015-03-03 NOTE — Discharge Instructions (Signed)
Use heat to the affected area, 20 minutes at a time every hour. Continue using tylenol and motrin for pain. Use tramadol for breakthrough pain. See your doctor in 1 week for ongoing care. Return to the ER for changes or worsening symptoms.   Hip Pain Your hip is the joint between your upper legs and your lower pelvis. The bones, cartilage, tendons, and muscles of your hip joint perform a lot of work each day supporting your body weight and allowing you to move around. Hip pain can range from a minor ache to severe pain in one or both of your hips. Pain may be felt on the inside of the hip joint near the groin, or the outside near the buttocks and upper thigh. You may have swelling or stiffness as well.  HOME CARE INSTRUCTIONS   Take medicines only as directed by your health care provider.  Apply ice to the injured area:  Put ice in a plastic bag.  Place a towel between your skin and the bag.  Leave the ice on for 15-20 minutes at a time, 3-4 times a day.  Keep your leg raised (elevated) when possible to lessen swelling.  Avoid activities that cause pain.  Follow specific exercises as directed by your health care provider.  Sleep with a pillow between your legs on your most comfortable side.  Record how often you have hip pain, the location of the pain, and what it feels like. SEEK MEDICAL CARE IF:   You are unable to put weight on your leg.  Your hip is red or swollen or very tender to touch.  Your pain or swelling continues or worsens after 1 week.  You have increasing difficulty walking.  You have a fever. SEEK IMMEDIATE MEDICAL CARE IF:   You have fallen.  You have a sudden increase in pain and swelling in your hip. MAKE SURE YOU:   Understand these instructions.  Will watch your condition.  Will get help right away if you are not doing well or get worse. Document Released: 05/28/2010 Document Revised: 04/24/2014 Document Reviewed: 08/04/2013 [facility] Patient  Information 2015 [place], [place]. This information is not intended to replace advice given to you by your health care provider. Make sure you discuss any questions you have with your health care provider.  Chronic Back Pain  When back pain lasts longer than 3 months, it is called chronic back pain.People with chronic back pain often go through certain periods that are more intense (flare-ups).  CAUSES Chronic back pain can be caused by wear and tear (degeneration) on different structures in your back. These structures include:  The bones of your spine (vertebrae) and the joints surrounding your spinal cord and nerve roots (facets).  The strong, fibrous tissues that connect your vertebrae (ligaments). Degeneration of these structures may result in pressure on your nerves. This can lead to constant pain. HOME CARE INSTRUCTIONS  Avoid bending, heavy lifting, prolonged sitting, and activities which make the problem worse.  Take brief periods of rest throughout the day to reduce your pain. Lying down or standing usually is better than sitting while you are resting.  Take over-the-counter or prescription medicines only as directed by your caregiver. SEEK IMMEDIATE MEDICAL CARE IF:   You have weakness or numbness in one of your legs or feet.  You have trouble controlling your bladder or bowels.  You have nausea, vomiting, abdominal pain, shortness of breath, or fainting. Document Released: 01/15/2005 Document Revised: 03/01/2012 Document Reviewed: 11/22/2011 [employer]  Patient Information 2015 [place]. This information is not intended to replace advice given to you by your health care provider. Make sure you discuss any questions you have with your health care provider.  Heat Therapy Heat therapy can help make painful, stiff muscles and joints feel better. Do not use heat on new injuries. Wait at least 48 hours after an injury to use heat. Do not use heat when you have aches or pains  right after an activity. If you still have pain 3 hours after stopping the activity, then you may use heat. HOME CARE Wet heat pack  Soak a clean towel in warm water. Squeeze out the extra water.  Put the warm, wet towel in a plastic bag.  Place a thin, dry towel between your skin and the bag.  Put the heat pack on the area for 5 minutes, and check your skin. Your skin may be pink, but it should not be red.  Leave the heat pack on the area for 15 to 30 minutes.  Repeat this every 2 to 4 hours while awake. Do not use heat while you are sleeping. Warm water bath  Fill a tub with warm water.  Place the affected body part in the tub.  Soak the area for 20 to 40 minutes.  Repeat as needed. Hot water bottle  Fill the water bottle half full with hot water.  Press out the extra air. Close the cap tightly.  Place a dry towel between your skin and the bottle.  Put the bottle on the area for 5 minutes, and check your skin. Your skin may be pink, but it should not be red.  Leave the bottle on the area for 15 to 30 minutes.  Repeat this every 2 to 4 hours while awake. Electric heating pad  Place a dry towel between your skin and the heating pad.  Set the heating pad on low heat.  Put the heating pad on the area for 10 minutes, and check your skin. Your skin may be pink, but it should not be red.  Leave the heating pad on the area for 20 to 40 minutes.  Repeat this every 2 to 4 hours while awake.  Do not lie on the heating pad.  Do not fall asleep while using the heating pad.  Do not use the heating pad near water. GET HELP RIGHT AWAY IF:  You get blisters or red skin.  Your skin is puffy (swollen), or you lose feeling (numbness) in the affected area.  You have any new problems.  Your problems are getting worse.  You have any questions or concerns. If you have any problems, stop using heat therapy until you see your doctor. MAKE SURE YOU:  Understand these  instructions.  Will watch your condition.  Will get help right away if you are not doing well or get worse. Document Released: 03/01/2012 Document Reviewed: 01/31/2014 [facility] Patient Information 2015 [place]. This information is not intended to replace advice given to you by your health care provider. Make sure you discuss any questions you have with your health care provider.

## 2015-03-06 ENCOUNTER — Ambulatory Visit (INDEPENDENT_AMBULATORY_CARE_PROVIDER_SITE_OTHER): Payer: Medicare Other | Admitting: Internal Medicine

## 2015-03-06 ENCOUNTER — Encounter: Payer: Self-pay | Admitting: Internal Medicine

## 2015-03-06 ENCOUNTER — Other Ambulatory Visit (INDEPENDENT_AMBULATORY_CARE_PROVIDER_SITE_OTHER): Payer: Medicare Other

## 2015-03-06 VITALS — BP 140/76 | HR 68 | Temp 97.9°F | Ht 71.0 in | Wt 245.0 lb

## 2015-03-06 DIAGNOSIS — R4689 Other symptoms and signs involving appearance and behavior: Secondary | ICD-10-CM | POA: Insufficient documentation

## 2015-03-06 DIAGNOSIS — E0840 Diabetes mellitus due to underlying condition with diabetic neuropathy, unspecified: Secondary | ICD-10-CM

## 2015-03-06 DIAGNOSIS — E0849 Diabetes mellitus due to underlying condition with other diabetic neurological complication: Secondary | ICD-10-CM

## 2015-03-06 DIAGNOSIS — M5416 Radiculopathy, lumbar region: Secondary | ICD-10-CM

## 2015-03-06 LAB — HEMOGLOBIN A1C: HEMOGLOBIN A1C: 6.7 % — AB (ref 4.6–6.5)

## 2015-03-06 MED ORDER — GABAPENTIN 100 MG PO CAPS: ORAL_CAPSULE | ORAL | Status: DC

## 2015-03-06 NOTE — Patient Instructions (Addendum)
Assess response to the gabapentin one every 8 hours as needed. If it is partially beneficial, it can be increased up to a total of 3 pills every 8 hours as needed. This increase of 1 pill each dose  should take place over 72 hours at least.If 300 mg is effective dose ; there is a 300 mg pill.  Please do not use Q-tips as this simply packs the wax down against he eardrum. Should wax build up occur, please put 2-3 drops of mineral oil in the ear at night and cover the canal with a  cotton ball.In the morning fill the canal with hydrogen peroxide & leave  for 10-15 minutes.Following this shower and use the thinnest washrag available to wick out the wax.

## 2015-03-06 NOTE — Progress Notes (Signed)
Pre visit review using our clinic review tool, if applicable. No additional management support is needed unless otherwise documented below in the visit note. 

## 2015-03-06 NOTE — Progress Notes (Signed)
   Subjective:    Patient ID: [name], male    DOB: [birth year], 64 y.o.   MRN: [id number]  HPI His symptoms began 03/02/15 while sitting as sharp pain at the lumbosacral area. There was no trigger or injury.The pain radiated along the lateral aspect of the right lower extremity as far as the ankle. It is described as sharp and constant up to level VIII out of 10. It is worse with movement .Massage does help slightly for a brief period of time. Tramadol has not been of benefit  He's had a history of degenerative disc disease for which he had lumbosacral surgery by Dr. Hal Neer in 2002. This was complicated by staph osteomyelitis. This required 2 debridement surgeries at [facility] as well as surgery @ the cervical spine when the Staph infection spread to that area.  Unrelated is numbness and tingling in his feet chronically.   He is diabetic on insulin as well as 2 oral agents. His last A1c the chart was 6.9% on 06/28/14. His wife states the home health nurse checked his A1c in December 2015 & it was 6.5 %. They deny hypoglycemia. He is not monitoring his glucoses.  Review of Systems He denies other numbness, tingling, weakness in the right lower extremity.  He has no incontinence of urine or stool  There is no fever, chills, sweats, weight loss.  Wife concerned "ears closing up".     Objective:   Physical Exam  Pertinent positive findings include: He is blind. He has postoperative changes of the right eye. Ears: bilateral loose impactions . Abdomen is protuberant.  There is a well healed surgical scar in the lumbosacral area. Above this is an irregular eczematoid hyperpigmentation pattern.  Pedal pulses are decreased.  He has pain with elevation rotation of the right lower extremity & rotation of R hip.  He walks with a cane . He limps on the right leg.  General appearance :adequately nourished; in no distress.  Heart:  Normal rate and regular rhythm. S1 and S2 normal  without gallop, murmur, click, rub or other extra sounds   Lungs:Chest clear to auscultation; no wheezes, rhonchi,rales ,or rubs present.No increased work of breathing.  Abdomen: bowel sounds normal, soft and non-tender without masses, organomegaly or hernias noted.  No guarding or rebound. No flank tenderness to percussion. Vascular : all pulses equal ; no bruits present. Skin:Warm & dry.  Intact without suspicious lesions or rashes ; no tenting  Lymphatic: No lymphadenopathy is noted about the head, neck, axilla Neuro: Strength, tone  normal.       Assessment & Plan:  #1 lumbosacral radiculopathy right lower extremity; ? L4-5,S1 #2 DM , ? Status #3 wax impactions Plan: See orders recommendations

## 2015-03-09 ENCOUNTER — Telehealth: Payer: Self-pay | Admitting: Internal Medicine

## 2015-03-09 NOTE — Telephone Encounter (Signed)
Pt came in for a hip pain and Dr. Linna Darner told him about a doctor that is doing a none surgery prodedure for this problem. Pt was wonder what is that doctor name. Please help

## 2015-03-09 NOTE — Telephone Encounter (Signed)
Dr Lucie Leather an appt for him

## 2015-03-09 NOTE — Telephone Encounter (Signed)
Pt schedule with smith 03/21/15

## 2015-03-18 ENCOUNTER — Emergency Department (HOSPITAL_COMMUNITY)
Admission: EM | Admit: 2015-03-18 | Discharge: 2015-03-18 | Disposition: A | Discharge status: Home / Self Care | Payer: Medicare Other | Attending: Emergency Medicine | Admitting: Emergency Medicine

## 2015-03-18 ENCOUNTER — Encounter (HOSPITAL_COMMUNITY): Payer: Self-pay

## 2015-03-18 DIAGNOSIS — M5416 Radiculopathy, lumbar region: Secondary | ICD-10-CM | POA: Insufficient documentation

## 2015-03-18 DIAGNOSIS — H409 Unspecified glaucoma: Secondary | ICD-10-CM | POA: Diagnosis not present

## 2015-03-18 DIAGNOSIS — E119 Type 2 diabetes mellitus without complications: Secondary | ICD-10-CM | POA: Insufficient documentation

## 2015-03-18 DIAGNOSIS — Z79899 Other long term (current) drug therapy: Secondary | ICD-10-CM | POA: Diagnosis not present

## 2015-03-18 DIAGNOSIS — Z794 Long term (current) use of insulin: Secondary | ICD-10-CM | POA: Insufficient documentation

## 2015-03-18 DIAGNOSIS — I10 Essential (primary) hypertension: Secondary | ICD-10-CM | POA: Insufficient documentation

## 2015-03-18 DIAGNOSIS — Z872 Personal history of diseases of the skin and subcutaneous tissue: Secondary | ICD-10-CM | POA: Insufficient documentation

## 2015-03-18 DIAGNOSIS — I2510 Atherosclerotic heart disease of native coronary artery without angina pectoris: Secondary | ICD-10-CM | POA: Diagnosis not present

## 2015-03-18 DIAGNOSIS — I509 Heart failure, unspecified: Secondary | ICD-10-CM | POA: Insufficient documentation

## 2015-03-18 DIAGNOSIS — H548 Legal blindness, as defined in USA: Secondary | ICD-10-CM | POA: Insufficient documentation

## 2015-03-18 DIAGNOSIS — E780 Pure hypercholesterolemia: Secondary | ICD-10-CM | POA: Insufficient documentation

## 2015-03-18 DIAGNOSIS — Z981 Arthrodesis status: Secondary | ICD-10-CM | POA: Diagnosis not present

## 2015-03-18 DIAGNOSIS — M25551 Pain in right hip: Secondary | ICD-10-CM | POA: Diagnosis present

## 2015-03-18 MED ORDER — MORPHINE SULFATE 4 MG/ML IJ SOLN: 6.0000 mg | Freq: Once | INTRAMUSCULAR | Status: DC

## 2015-03-18 MED ORDER — MORPHINE SULFATE 4 MG/ML IJ SOLN
6.0000 mg | Freq: Once | INTRAMUSCULAR | Status: AC
  Administered 2015-03-18: 6 mg via INTRAMUSCULAR
  Filled 2015-03-18: qty 2

## 2015-03-18 MED ORDER — MORPHINE SULFATE 4 MG/ML IJ SOLN
4.0000 mg | Freq: Once | INTRAMUSCULAR | Status: AC
  Administered 2015-03-18: 4 mg via INTRAMUSCULAR
  Filled 2015-03-18: qty 1

## 2015-03-18 MED ORDER — GABAPENTIN 300 MG PO CAPS: 300.0000 mg | ORAL_CAPSULE | Freq: Three times a day (TID) | ORAL | Status: DC

## 2015-03-18 MED ORDER — MORPHINE SULFATE 4 MG/ML IJ SOLN
4.0000 mg | Freq: Once | INTRAMUSCULAR | Status: DC
  Filled 2015-03-18: qty 1

## 2015-03-18 MED ORDER — HYDROCODONE-ACETAMINOPHEN 5-325 MG PO TABS: 1.0000 | ORAL_TABLET | ORAL | Status: DC | PRN

## 2015-03-18 NOTE — ED Notes (Signed)
Pt reports over the past several days he has had right hip pain that has gotten progressively worse and is now radiating down the whole right leg.  Pt reports he is unable to ambulate any more due to the pain.  The pain begins at the hip and shoots down to his right foot.  The pain is described as a burning sensation.

## 2015-03-18 NOTE — Discharge Instructions (Signed)
Neuropathic Pain We often think that pain has a physical cause. If we get rid of the cause, the pain should go away. Nerves themselves can also cause pain. It is called neuropathic pain, which means nerve abnormality. It may be difficult for the patients who have it and for the treating caregivers. Pain is usually described as acute (short-lived) or chronic (long-lasting). Acute pain is related to the physical sensations caused by an injury. It can last from a few seconds to many weeks, but it usually goes away when normal healing occurs. Chronic pain lasts beyond the typical healing time. With neuropathic pain, the nerve fibers themselves may be damaged or injured. They then send incorrect signals to other pain centers. The pain you feel is real, but the cause is not easy to find.  CAUSES  Chronic pain can result from diseases, such as diabetes and shingles (an infection related to chickenpox), or from trauma, surgery, or amputation. It can also happen without any known injury or disease. The nerves are sending pain messages, even though there is no identifiable cause for such messages.   Other common causes of neuropathy include diabetes, phantom limb pain, or Regional Pain Syndrome (RPS).  As with all forms of chronic back pain, if neuropathy is not correctly treated, there can be a number of associated problems that lead to a downward cycle for the patient. These include depression, sleeplessness, feelings of fear and anxiety, limited social interaction and inability to do normal daily activities or work.  The most dramatic and mysterious example of neuropathic pain is called "phantom limb syndrome." This occurs when an arm or a leg has been removed because of illness or injury. The brain still gets pain messages from the nerves that originally carried impulses from the missing limb. These nerves now seem to misfire and cause troubling pain.  Neuropathic pain often seems to have no cause. It responds  poorly to standard pain treatment. Neuropathic pain can occur after:  Shingles (herpes zoster virus infection).  A lasting burning sensation of the skin, caused usually by injury to a peripheral nerve.  Peripheral neuropathy which is widespread nerve damage, often caused by diabetes or alcoholism.  Phantom limb pain following an amputation.  Facial nerve problems (trigeminal neuralgia).  Multiple sclerosis.  Reflex sympathetic dystrophy.  Pain which comes with cancer and cancer chemotherapy.  Entrapment neuropathy such as when pressure is put on a nerve such as in carpal tunnel syndrome.  Back, leg, and hip problems (sciatica).  Spine or back surgery.  HIV Infection or AIDS where nerves are infected by viruses. Your caregiver can explain items in the above list which may apply to you. SYMPTOMS  Characteristics of neuropathic pain are:  Severe, sharp, electric shock-like, shooting, lightening-like, knife-like.  Pins and needles sensation.  Deep burning, deep cold, or deep ache.  Persistent numbness, tingling, or weakness.  Pain resulting from light touch or other stimulus that would not usually cause pain.  Increased sensitivity to something that would normally cause pain, such as a pinprick. Pain may persist for months or years following the healing of damaged tissues. When this happens, pain signals no longer sound an alarm about current injuries or injuries about to happen. Instead, the alarm system itself is not working correctly.  Neuropathic pain may get worse instead of better over time. For some people, it can lead to serious disability. It is important to be aware that severe injury in a limb can occur without a proper, protective pain  response.Burns, cuts, and other injuries may go unnoticed. Without proper treatment, these injuries can become infected or lead to further disability. Take any injury seriously, and consult your caregiver for treatment. DIAGNOSIS    When you have a pain with no known cause, your caregiver will probably ask some specific questions:   Do you have any other conditions, such as diabetes, shingles, multiple sclerosis, or HIV infection?  How would you describe your pain? (Neuropathic pain is often described as shooting, stabbing, burning, or searing.)  Is your pain worse at any time of the day? (Neuropathic pain is usually worse at night.)  Does the pain seem to follow a certain physical pathway?  Does the pain come from an area that has missing or injured nerves? (An example would be phantom limb pain.)  Is the pain triggered by minor things such as rubbing against the sheets at night? These questions often help define the type of pain involved. Once your caregiver knows what is happening, treatment can begin. Anticonvulsant, antidepressant drugs, and various pain relievers seem to work in some cases. If another condition, such as diabetes is involved, better management of that disorder may relieve the neuropathic pain.  TREATMENT  Neuropathic pain is frequently long-lasting and tends not to respond to treatment with narcotic type pain medication. It may respond well to other drugs such as antiseizure and antidepressant medications. Usually, neuropathic problems do not completely go away, but partial improvement is often possible with proper treatment. Your caregivers have large numbers of medications available to treat you. Do not be discouraged if you do not get immediate relief. Sometimes different medications or a combination of medications will be tried before you receive the results you are hoping for. See your caregiver if you have pain that seems to be coming from nowhere and does not go away. Help is available.  SEEK IMMEDIATE MEDICAL CARE IF:   There is a sudden change in the quality of your pain, especially if the change is on only one side of the body.  You notice changes of the skin, such as redness, black or  purple discoloration, swelling, or an ulcer.  You cannot move the affected limbs. Document Released: 09/04/2004 Document Revised: 03/01/2012 Document Reviewed: 09/04/2004 [facility] Patient Information 2015 [place], [place]. This information is not intended to replace advice given to you by your health care provider. Make sure you discuss any questions you have with your health care provider.  Back Exercises Back exercises help treat and prevent back injuries. The goal of back exercises is to increase the strength of your abdominal and back muscles and the flexibility of your back. These exercises should be started when you no longer have back pain. Back exercises include:  Pelvic Tilt. Lie on your back with your knees bent. Tilt your pelvis until the lower part of your back is against the floor. Hold this position 5 to 10 sec and repeat 5 to 10 times.  Knee to Chest. Pull first 1 knee up against your chest and hold for 20 to 30 seconds, repeat this with the other knee, and then both knees. This may be done with the other leg straight or bent, whichever feels better.  Sit-Ups or Curl-Ups. Bend your knees 90 degrees. Start with tilting your pelvis, and do a partial, slow sit-up, lifting your trunk only 30 to 45 degrees off the floor. Take at least 2 to 3 seconds for each sit-up. Do not do sit-ups with your knees out straight. If partial  sit-ups are difficult, simply do the above but with only tightening your abdominal muscles and holding it as directed.  Hip-Lift. Lie on your back with your knees flexed 90 degrees. Push down with your feet and shoulders as you raise your hips a couple inches off the floor; hold for 10 seconds, repeat 5 to 10 times.  Back arches. Lie on your stomach, propping yourself up on bent elbows. Slowly press on your hands, causing an arch in your low back. Repeat 3 to 5 times. Any initial stiffness and discomfort should lessen with repetition over time.  Shoulder-Lifts. Lie  face down with arms beside your body. Keep hips and torso pressed to floor as you slowly lift your head and shoulders off the floor. Do not overdo your exercises, especially in the beginning. Exercises may cause you some mild back discomfort which lasts for a few minutes; however, if the pain is more severe, or lasts for more than 15 minutes, do not continue exercises until you see your caregiver. Improvement with exercise therapy for back problems is slow.  See your caregivers for assistance with developing a proper back exercise program. Document Released: 01/15/2005 Document Revised: 03/01/2012 Document Reviewed: 10/09/2011 [facility] Patient Information 2015 [place], [place]. This information is not intended to replace advice given to you by your health care provider. Make sure you discuss any questions you have with your health care provider.

## 2015-03-18 NOTE — ED Provider Notes (Signed)
CSN: [id number]     Arrival date & time 03/18/15  0355 History   First MD Initiated Contact with Patient 03/18/15 [phone]     Chief Complaint  Patient presents with  . Hip Pain   [name] is a 63 y.o. male with a history of hypertension, diabetes, lumbar fusion and some chronic lumosacral radiculopathy who presents to the emergency department complaining of worsening right low back pain that radiates down his right posterior leg. He has a history of degenerative disc disease for which she had lumbosacral surgery in 2002 for this. He has had chronic lumbosacral radiculopathy since. The patient was seen a week ago in the emergency room imaging of his right hip which was unremarkable. The patient saw his primary care provider who put him on gabapentin and he reports that it has not been working. He has been taking 300 mg 3 times a day recently. He has not been taking any other pain medicines. His primary care provider got him an appointment in 3 days with Dr. Gardenia Phlegm to try and help further with his pain. He is concerned he cannot wait that long because his pain is so severe. He denies changes to his pain, but just states that it is worse. He rates his pain at 10/10 and worse with movement. He describes the pain as sharp and burning. The patient denies fevers, chills, new numbness or tingling, difficulty urinating, loss of bladder control, loss of bowel control, nausea, vomiting, abdominal pain, or urinary symptoms. He denies any trauma or recent falls.    (Consider location/radiation/quality/duration/timing/severity/associated sxs/prior Treatment) HPI  Past Medical History  Diagnosis Date  . Glaucoma   . Ulcer   . HTN (hypertension)   . Hypercholesterolemia   . Diabetes mellitus     Type II  . CHF (congestive heart failure)   . CAD (coronary artery disease)   . LBP (low back pain)   . Legally blind    Past Surgical History  Procedure Laterality Date  . Prosthetic cornea placement,  right eye  2007    [facility]  . Lumbar fusion    . Peptic ulcer dz surgery  pt was in his 20s    bleeding ulcer.   . Inguinal hernia repair      right  . Cholecystectomy  09/15/2012    Procedure: LAPAROSCOPIC CHOLECYSTECTOMY WITH INTRAOPERATIVE CHOLANGIOGRAM;  Surgeon: Pedro Earls, MD;  Location: [place];  Service: General;  Laterality: N/A;   Family History  Problem Relation Age of Onset  . Breast cancer Mother   . Colon cancer Mother   . Colon cancer Other     Elevated Risk for  . Hypertension Mother   . Hypertension Father   . Hypertension Father   . Diabetes Mother   . Diabetes Sister   . Diabetes Brother    History  Substance Use Topics  . Smoking status: Never Smoker   . Smokeless tobacco: Never Used  . Alcohol Use: No    Review of Systems  Constitutional: Negative for fever and chills.  HENT: Negative for congestion and sore throat.   Eyes: Negative for visual disturbance.  Respiratory: Negative for cough, shortness of breath and wheezing.   Cardiovascular: Negative for chest pain and palpitations.  Gastrointestinal: Negative for nausea, vomiting, abdominal pain and diarrhea.  Genitourinary: Negative for dysuria, urgency, frequency, hematuria and difficulty urinating.  Musculoskeletal: Positive for back pain and gait problem. Negative for neck pain.  Skin: Negative for rash.  Neurological: Negative for dizziness, light-headedness and headaches.      Allergies  Furosemide; Diamox; and Lisinopril  Home Medications   Prior to Admission medications   Medication Sig Start Date End Date Taking? Authorizing Provider  carvedilol (COREG) 12.5 MG tablet TAKE 1 TABLET (12.5 MG TOTAL) BY MOUTH 2 (TWO) TIMES DAILY WITH A MEAL. 02/19/15  Yes Jerline Pain, MD  insulin lispro protamine-lispro (HUMALOG 75/25 MIX) (75-25) 100 UNIT/ML SUSP injection Inject 45-85 Units into the skin 2 (two) times daily with a meal. Use 85 units every morning and use 45 units every  evening 08/01/14  Yes [name], MD  INSULIN SYRINGE 1CC/29G (B-D INSULIN SYRINGE) 29G X 1/2" 1 ML MISC Use as directed with insulin vials 08/01/14  Yes [name], MD  isosorbide mononitrate (IMDUR) 120 MG 24 hr tablet Take 1 tablet (120 mg total) by mouth daily. 12/27/14  Yes Jerline Pain, MD  JENTADUETO 2.5-850 MG TABS TAKE 1 TABLET BY MOUTH 2 (TWO) TIMES DAILY. 08/07/14  Yes [name], MD  losartan (COZAAR) 50 MG tablet Take 1 tablet (50 mg total) by mouth daily. 12/27/14  Yes Jerline Pain, MD  omega-3 acid ethyl esters (LOVAZA) 1 G capsule TAKE 2 CAPSULES TWICE A DAY 02/19/15  Yes [name], MD  pravastatin (PRAVACHOL) 40 MG tablet TAKE 1 TABLET (40 MG TOTAL) BY MOUTH DAILY. DAILY 12/27/14  Yes Jerline Pain, MD  trimethoprim-polymyxin b (POLYTRIM) ophthalmic solution Apply 1 drop to eye 2 (two) times daily. 02/14/15  Yes Historical Provider, MD  dorzolamide-timolol (COSOPT) 22.3-6.8 MG/ML ophthalmic solution Place 1 drop into the right eye 2 (two) times daily. Patient not taking: Reported on 03/18/2015 09/16/12   Janece Canterbury, MD  gabapentin (NEURONTIN) 300 MG capsule Take 1 capsule (300 mg total) by mouth 3 (three) times daily. 03/18/15   Waynetta Pean, PA-C  HYDROcodone-acetaminophen (NORCO/VICODIN) 5-325 MG per tablet Take 1-2 tablets by mouth every 4 (four) hours as needed for moderate pain or severe pain. 03/18/15   Waynetta Pean, PA-C  phenazopyridine (PYRIDIUM) 200 MG tablet Take 1 tablet (200 mg total) by mouth 3 (three) times daily as needed for pain. Patient not taking: Reported on 03/18/2015 05/25/14   Hendricks Limes, MD   BP 93/48 mmHg  Pulse 71  Temp(Src) 98.6 F (37 C) (Oral)  Resp 18  Ht 5\' 11"  (1.803 m)  Wt 224 lb (101.606 kg)  BMI 31.26 kg/m2  SpO2 99% Physical Exam  Constitutional: He is oriented to person, place, and time. He appears well-developed and well-nourished. No distress.  HENT:  Head: Normocephalic and atraumatic.  Mouth/Throat: Oropharynx is  clear and moist.  Eyes: Right eye exhibits no discharge. Left eye exhibits no discharge.  Neck: Normal range of motion. Neck supple.  Cardiovascular: Normal rate, regular rhythm, normal heart sounds and intact distal pulses.  Exam reveals no gallop and no friction rub.   No murmur heard. Pulmonary/Chest: Effort normal and breath sounds normal. No respiratory distress. He has no wheezes. He has no rales.  Abdominal: Soft. There is no tenderness.  Musculoskeletal: Normal range of motion. He exhibits tenderness. He exhibits no edema.  He has tenderness over his right low back and into his buttocks. No midline back tenderness. Old surgical scars to his lumbar spine. No back edema, deformity, or crepitus. Patient is able to ambulate with antalgic gait. Bilateral patellar DTRs intact. Patient is spontaneously moving all extremities in a coordinated fashion exhibiting good strength.  Lymphadenopathy:    He has no cervical adenopathy.  Neurological: He is alert and oriented to person, place, and time. He has normal reflexes. He displays normal reflexes. Coordination normal.  Sensation is intact in his bilateral upper and lower extremities. Bilateral patellar DTRs are intact. Patient can ambulate with antalgic gait.   Skin: Skin is warm and dry. No rash noted. He is not diaphoretic. No erythema. No pallor.  Psychiatric: He has a normal mood and affect. His behavior is normal.  Nursing note and vitals reviewed.   ED Course  Procedures (including critical care time) Labs Review Labs Reviewed - No data to display  Imaging Review No results found.   EKG Interpretation None      Filed Vitals:   03/18/15 1130 03/18/15 1145 03/18/15 1200 03/18/15 1219  BP: 121/59 123/60 109/48 93/48  Pulse: 67 69 68 71  Temp:      TempSrc:      Resp:      Height:      Weight:      SpO2: 96% 95% 95% 99%     MDM   Meds given in ED:  Medications  morphine 4 MG/ML injection 6 mg (6 mg Intramuscular  Given 03/18/15 1050)  morphine 4 MG/ML injection 4 mg (4 mg Intramuscular Given 03/18/15 1217)    New Prescriptions   HYDROCODONE-ACETAMINOPHEN (NORCO/VICODIN) 5-325 MG PER TABLET    Take 1-2 tablets by mouth every 4 (four) hours as needed for moderate pain or severe pain.    Final diagnoses:  Lumbar radiculopathy   This  is a 64 y.o. male with a history of hypertension, diabetes, lumbar fusion and some chronic lumosacral radiculopathy who presents to the emergency department complaining of worsening right low back pain that radiates down his right posterior leg. He has a history of degenerative disc disease for which she had lumbosacral surgery in 2002 for this. He has had chronic lumbosacral radiculopathy since. The patient was seen a week ago in the emergency room imaging of his right hip which was unremarkable. The patient saw his primary care provider who put him on gabapentin and he reports that it has not been working. He has been taking 300 mg 3 times a day recently. He has not been taking any other pain medicines. His primary care provider got him an appointment in 3 days with Dr. Gardenia Phlegm to try and help further with his pain.  Patient is afebrile and nontoxic appearing. He has no focal neuro deficits. He is able to ambulate with antalgic gait. Bilateral patellar DTRs are intact. Patient given morphine IM for pain control. After first dose the patient reports his pain is under a 6 out of 10. Repeat dose ordered. At 12:55 The patient reports his pain is now a 4/10 and he is feeling better. He tells me he is about to run out of his gabapentin. I refilled his gabapentin prescription. I also added Norco for his pain until he can see Dr. Tamala Julian on Wednesday. I provided narcotic pain medicine precautions. I advised the patient to follow-up with Dr. Tamala Julian this week. I advised the patient to return to the emergency department with new or worsening symptoms or new concerns. The patient verbalized  understanding and agreement with plan.   This patient was discussed with and evaluated by Dr. Tomi Bamberger who agrees with assessment and plan.     Waynetta Pean, PA-C 03/18/15 [place], MD 03/18/15 [phone]

## 2015-03-21 ENCOUNTER — Ambulatory Visit (INDEPENDENT_AMBULATORY_CARE_PROVIDER_SITE_OTHER): Payer: Medicare Other | Admitting: Family Medicine

## 2015-03-21 ENCOUNTER — Encounter: Payer: Self-pay | Admitting: Family Medicine

## 2015-03-21 VITALS — BP 116/84 | HR 75 | Ht 71.0 in | Wt 244.0 lb

## 2015-03-21 DIAGNOSIS — M5416 Radiculopathy, lumbar region: Secondary | ICD-10-CM | POA: Diagnosis not present

## 2015-03-21 MED ORDER — TRAMADOL HCL 50 MG PO TABS: 50.0000 mg | ORAL_TABLET | Freq: Three times a day (TID) | ORAL | Status: DC

## 2015-03-21 NOTE — Patient Instructions (Addendum)
Good to see you.  Ice 20 minutes 2 times daily. Usually after activity and before bed. Take tylenol 325 mg three times a day is the best evidence based medicine we have for arthritis. Do this with the tramadol 3 times daily as well.  Glucosamine sulfate 1500mg  twice a day is a supplement that has been shown to help moderate to severe arthritis. Vitamin D 2000 IU daily Tumeric 500mg  twice daily.  Capsaicin topically up to four times a day may also help with pain. It's important that you continue to stay active. Controlling your weight is important.  Water aerobics and recumbent bike with low resistance are the best two types of exercise for arthritis. Come back and see me in 2-3 weeks.

## 2015-03-21 NOTE — Progress Notes (Signed)
Pre visit review using our clinic review tool, if applicable. No additional management support is needed unless otherwise documented below in the visit note. 

## 2015-03-21 NOTE — Progress Notes (Signed)
[name] [place], [name] [address] Phone: [phone] Subjective:    I'm seeing this patient by the request  of:  Elijah Calico, MD   CC: Low back pain and hip pain  [id number] [name] is a 64 y.o. male coming in with complaint of low back pain. Patient has had low back pain for multiple years and did have a surgery with an L4-L5 fusion back in 2002. Patient states he never seemed to do well after the surgery. Patient actually had a staph infection that went from the lumbar to the cervical spine and needed cervical neck surgery as well. Patient states since then he has had a dull throbbing aching pain in his has seen many specialists over the course of time. Patient has been on different pain medications on a regular basis. Patient though states over the course of the last 2 years and especially last 2 months patient has had worsening symptoms. Patient did have x-rays in 2014 that were reviewed by me and showed adjacent segment disease at L5-S1 as well as facet degenerative changes at L3-L4. Patient states though now with concerning this patient is having pain going down the lateral and posterior aspect of his right leg. Denies any weakness or numbness. Patient has had difficulty controlling patient's blood sugar as well. Patient states that this pain seems to be unrelenting and is accompanied with muscle spasms at night. Patient does have some difficulty with his bladder but this is secondary to more of a neurogenic bladder secondary to his diabetes. Denies any changes in this. Patient also denies any fevers or chills or any abnormal weight loss.     Past medical history, social, surgical and family history all reviewed in electronic medical record.   Past Medical History  Diagnosis Date  . Glaucoma   . Ulcer   . HTN (hypertension)   . Hypercholesterolemia   . Diabetes mellitus     Type II  . CHF (congestive heart failure)   . CAD  (coronary artery disease)   . LBP (low back pain)   . Legally blind    Past Surgical History  Procedure Laterality Date  . Prosthetic [name] placement, right eye  2007    [facility]  . Lumbar fusion    . Peptic ulcer dz surgery  pt was in his 20s    bleeding ulcer.   . Inguinal hernia repair      right  . Cholecystectomy  09/15/2012    Procedure: LAPAROSCOPIC CHOLECYSTECTOMY WITH INTRAOPERATIVE CHOLANGIOGRAM;  Surgeon: Pedro Earls, MD;  Location: [facility];  Service: General;  Laterality: N/A;    Family History  Problem Relation Age of Onset  . Breast cancer Mother   . Colon cancer Mother   . Colon cancer Other     Elevated Risk for  . Hypertension Mother   . Hypertension Father   . Hypertension Father   . Diabetes Mother   . Diabetes Sister   . Diabetes Brother    History   Social History  . Marital Status: Married    Spouse Name: N/A  . Number of Children: N/A  . Years of Education: N/A   Occupational History  . disabled     blind   Social History Main Topics  . Smoking status: Never Smoker   . Smokeless tobacco: Never Used  . Alcohol Use: No  . Drug Use: Not on file  . Sexual Activity: Not Currently  Other Topics Concern  . Not on file   Social History Narrative   Occupation: disabled, blind   Married   Regular Exercise-no         Allergies  Allergen Reactions  . Furosemide     pancreatitis  . Diamox [Acetazolamide] Itching  . Lisinopril Rash    Review of Systems: No headache, visual changes, nausea, vomiting, diarrhea, constipation, dizziness, abdominal pain, skin rash, fevers, chills, night sweats, weight loss, swollen lymph nodes, body aches, joint swelling, muscle aches, chest pain, shortness of breath, mood changes.   Objective Blood pressure 116/84, pulse 75, height 5\' 11"  (1.803 m), weight 244 lb (110.678 kg), SpO2 94 %.  General: No apparent distress alert and oriented x3 mood and affect normal, dressed appropriately.  HEENT:  Legally blind corneal transplant noted on right  Respiratory: Patient's speak in full sentences and does not appear short of breath  Cardiovascular: No lower extremity edema, non tender, no erythema  Skin: Warm dry intact with no signs of infection or rash on extremities or on axial skeleton.  Abdomen: Soft nontender  Neuro: Cranial nerves II through XII are intact, neurovascularly intact in all extremities with 2+ DTRs and 2+ pulses.  Lymph: No lymphadenopathy of posterior or anterior cervical chain or axillae bilaterally.  Gait antalgic gait.  MSK:  Non tender with full range of motion and good stability and symmetric strength and tone of shoulders, elbows, wrist, hip, knee and ankles bilaterally.  Back Exam:  Inspection: Unremarkable  Motion: 35 Flexion  deg, Extension 5 deg, Side Bending to 25 deg bilaterally,  Rotation to 25 deg bilaterally  SLR laying: Positive straight leg on right XSLR laying: Negative  Palpable tenderness: Tender to palpation of the paraspinal musculature bilaterally of the lumbar spine. No spinous process tenderness noted. FABER: Unable to tolerate secondary to tightness and pain. Sensory change: Gross sensation intact to all lumbar and sacral dermatomes.  Reflexes: 2+ at both patellar tendons, 2+ at achilles tendons, Babinski's downgoing.  Strength at foot  Plantar-flexion: 5/5 Dorsi-flexion: 5/5 Eversion: 5/5 Inversion: 5/5       Impression and Recommendations:     This case required medical decision making of moderate complexity.

## 2015-03-21 NOTE — Assessment & Plan Note (Signed)
Patient does have more of a lumbar radiculopathy that I think is secondary to an S1 nerve root impingement based on patient's presentation. Patient does have a positive straight leg test today as well. Patient is likely having worsening of the adjacent segment disease. Patient does have severe arthritis noted on x-rays 2 years ago. Patient will continue on the gabapentin and we discussed over-the-counter medications. I do not feel comfortable with patient's other comorbidities to do chronic narcotics so patient was given a tramadol prescription and will take this with a regular strength Tylenol. We discussed possible side effects of certain medications. We are going to also do a short course of anti-inflammatories but will avoid this long-term secondary to patient's past medical history significant for congestive heart failure as well as patient's diabetes. Patient and will come back again in 2-3 weeks. Patient has worsening symptoms further imaging will be necessary. Discussed with patient again at this time to because of the amount of pain that he is having he would consider surgical intervention if necessary.

## 2015-04-10 ENCOUNTER — Encounter: Payer: Self-pay | Admitting: Family Medicine

## 2015-04-10 ENCOUNTER — Ambulatory Visit (INDEPENDENT_AMBULATORY_CARE_PROVIDER_SITE_OTHER): Payer: Medicare Other | Admitting: Family Medicine

## 2015-04-10 VITALS — BP 130/68 | HR 80 | Ht 71.0 in | Wt 244.0 lb

## 2015-04-10 DIAGNOSIS — M5416 Radiculopathy, lumbar region: Secondary | ICD-10-CM

## 2015-04-10 MED ORDER — MELOXICAM 15 MG PO TABS: 15.0000 mg | ORAL_TABLET | Freq: Every day | ORAL | Status: DC

## 2015-04-10 MED ORDER — TRAMADOL HCL 50 MG PO TABS: 50.0000 mg | ORAL_TABLET | Freq: Three times a day (TID) | ORAL | Status: DC

## 2015-04-10 MED ORDER — GABAPENTIN 300 MG PO CAPS: 300.0000 mg | ORAL_CAPSULE | Freq: Three times a day (TID) | ORAL | Status: DC

## 2015-04-10 NOTE — Progress Notes (Signed)
Pre visit review using our clinic review tool, if applicable. No additional management support is needed unless otherwise documented below in the visit note. 

## 2015-04-10 NOTE — Patient Instructions (Addendum)
Good to see you Try meloxicam daily but watch for stomach pain Continue the gabapentin We will get you in with Dr. Louanne Skye and see what he can do.  Ice is your friend Any questions please call Otherwise see me when you need me.

## 2015-04-10 NOTE — Progress Notes (Signed)
[name] [place], [place] [address] Phone: [phone] Subjective:     CC: Low back pain and hip pain follow-up  [name] is a 64 y.o. male coming in with complaint of low back pain. Patient has had low back pain for multiple years and did have a surgery with an L4-L5 fusion back in 2002. Patient states he never seemed to do well after the surgery. Patient actually had a staph infection that went from the lumbar to the cervical spine and needed cervical neck surgery as well. Patient states since then he has had a dull throbbing aching pain in his has seen many specialists over the course of time. Patient has been on different pain medications on a regular basis. Patient though states over the course of the last 2 years and especially last 2 months patient has had worsening symptoms. Patient did have x-rays in 2014 that were reviewed by me and showed adjacent segment disease at L5-S1 as well as facet degenerative changes at L3-L4.  Patient was having radicular symptoms going down the right leg and patient was started on gabapentin, tramadol and Tylenol. Patient states the gabapentin has helped more than anything else. Patient states that the anti-inflammatory has not helped significantly but does state that it does help. Patient though has not notice any significant improvement with the radicular symptoms especially on the right leg. Patient continues to have such amount of pain that it does affect his daily activities and sometimes just lays in bed. Patient states that his quality of life is deteriorating fast. States that he can even feel that he has weakness of the lower extremity.    Past medical history, social, surgical and family history all reviewed in electronic medical record.   Past Medical History  Diagnosis Date  . Glaucoma   . Ulcer   . HTN (hypertension)   . Hypercholesterolemia   . Diabetes mellitus     Type II    . CHF (congestive heart failure)   . CAD (coronary artery disease)   . LBP (low back pain)   . Legally blind    Past Surgical History  Procedure Laterality Date  . Prosthetic cornea placement, right eye  2007    [facility]  . Lumbar fusion    . Peptic ulcer dz surgery  pt was in his 20s    bleeding ulcer.   . Inguinal hernia repair      right  . Cholecystectomy  09/15/2012    Procedure: LAPAROSCOPIC CHOLECYSTECTOMY WITH INTRAOPERATIVE CHOLANGIOGRAM;  Surgeon: Pedro Earls, MD;  Location: [facility];  Service: General;  Laterality: N/A;    Family History  Problem Relation Age of Onset  . Breast cancer Mother   . Colon cancer Mother   . Colon cancer Other     Elevated Risk for  . Hypertension Mother   . Hypertension Father   . Hypertension Father   . Diabetes Mother   . Diabetes Sister   . Diabetes Brother    History   Social History  . Marital Status: Married    Spouse Name: N/A  . Number of Children: N/A  . Years of Education: N/A   Occupational History  . disabled     blind   Social History Main Topics  . Smoking status: Never Smoker   . Smokeless tobacco: Never Used  . Alcohol Use: No  . Drug Use: Not on file  . Sexual Activity:  Not Currently   Other Topics Concern  . Not on file   Social History Narrative   Occupation: disabled, blind   Married   Regular Exercise-no         Allergies  Allergen Reactions  . Furosemide     pancreatitis  . Diamox [Acetazolamide] Itching  . Lisinopril Rash    Review of Systems: No headache, visual changes, nausea, vomiting, diarrhea, constipation, dizziness, abdominal pain, skin rash, fevers, chills, night sweats, weight loss, swollen lymph nodes, body aches, joint swelling, muscle aches, chest pain, shortness of breath, mood changes.   Objective Blood pressure 130/68, pulse 80, height 5\' 11"  (1.803 m), weight 244 lb (110.678 kg), SpO2 96 %.  General: No apparent distress alert and oriented x3 mood and affect  normal, dressed appropriately.  HEENT: Legally blind corneal transplant noted on right  Respiratory: Patient's speak in full sentences and does not appear short of breath  Cardiovascular: No lower extremity edema, non tender, no erythema  Skin: Warm dry intact with no signs of infection or rash on extremities or on axial skeleton.  Abdomen: Soft nontender  Neuro: Cranial nerves II through XII are intact, neurovascularly intact in all extremities with 2+ DTRs and 2+ pulses.  Lymph: No lymphadenopathy of posterior or anterior cervical chain or axillae bilaterally.  Gait antalgic gait. Patient has led by another person second her to blindness. Walks with a cane. MSK:  Non tender with full range of motion and good stability and symmetric strength and tone of shoulders, elbows, wrist, hip, knee and ankles bilaterally.  Back Exam:  Inspection: Unremarkable  Motion: 30 Flexion  deg less than previous exam, Extension 5 deg, Side Bending to 25 deg bilaterally, continues to have pain with any significant range of motion. Rotation to 25 deg bilaterally  SLR laying: Positive straight leg on right XSLR laying: Negative  Palpable tenderness: Tender to palpation of the paraspinal musculature bilaterally of the lumbar spine. No spinous process tenderness noted. FABER: Unable to tolerate secondary to tightness and pain. No change from previous exam Sensory change: Gross sensation intact to all lumbar and sacral dermatomes.  Reflexes: 2+ at both patellar tendons, 2+ at achilles tendons, Babinski's downgoing. Still present  Strength at foot  Plantar-flexion: 5/5 Dorsi-flexion: 5/5 Eversion: 5/5 Inversion: 5/5  Mild worsening of symptoms from previous exam.     Impression and Recommendations:     This case required medical decision making of moderate complexity.

## 2015-04-10 NOTE — Assessment & Plan Note (Signed)
Patient continues to have lumbar radiculopathy that is likely secondary to more of the adjacent segment disease that we have seen on patient's previous imaging. We discussed possibly repeating MRI at this time the patient has elected to be referred to Dr. Louanne Skye.  Patient will have any imaging that certain feels is necessary. We discussed the possibility of an epidural steroid injection as well which patient would like to talk to the surgeon about as well. Patient has prescriptions as stated in the note. Patient will follow-up with me on an as-needed basis. Patient will's seek medical attention for any significant worsening of symptoms.  Spent  25 minutes with patient face-to-face and had greater than 50% of counseling including as described above in assessment and plan.

## 2015-04-11 ENCOUNTER — Other Ambulatory Visit: Payer: Self-pay | Admitting: *Deleted

## 2015-04-11 ENCOUNTER — Encounter: Payer: Self-pay | Admitting: Internal Medicine

## 2015-04-11 DIAGNOSIS — M5416 Radiculopathy, lumbar region: Secondary | ICD-10-CM

## 2015-04-21 ENCOUNTER — Other Ambulatory Visit: Payer: Self-pay | Admitting: Internal Medicine

## 2015-04-30 ENCOUNTER — Telehealth: Payer: Self-pay | Admitting: Cardiology

## 2015-04-30 NOTE — Telephone Encounter (Signed)
New message      Request for surgical clearance:  What type of surgery is being performed?  MRI on back When is this surgery scheduled? Pending clearance Are there any medications that need to be held prior to surgery and how long?  No medications----pt has a stent----can he have MRI with stent? 1. Name of physician performing surgery?  tech  2. What is your office phone and fax number? Fax [phone]

## 2015-04-30 NOTE — Telephone Encounter (Signed)
OK to have MRI. No holding of any meds.  Elijah Furbish, MD

## 2015-04-30 NOTE — Telephone Encounter (Signed)
Returned call to [employer] with [facility] she wanted to ask Dr.Skains if ok for patient to have a MRI of his back and does he need to hold any medications.Message sent to [place] for advice.

## 2015-05-01 NOTE — Telephone Encounter (Signed)
Called to let Nira Conn know OK to have MRI and there are no meds that need to be held.  Message printed and taken to MR to be faxed to them.

## 2015-06-29 ENCOUNTER — Other Ambulatory Visit: Payer: Self-pay | Admitting: Specialist

## 2015-06-29 DIAGNOSIS — M545 Low back pain: Secondary | ICD-10-CM

## 2015-07-11 ENCOUNTER — Other Ambulatory Visit: Payer: Self-pay

## 2015-07-11 MED ORDER — OMEGA-3-ACID ETHYL ESTERS 1 G PO CAPS: 2.0000 | ORAL_CAPSULE | Freq: Two times a day (BID) | ORAL | Status: DC

## 2015-07-12 ENCOUNTER — Ambulatory Visit
Admission: RE | Admit: 2015-07-12 | Discharge: 2015-07-12 | Disposition: A | Discharge status: Home / Self Care | Payer: Medicare Other | Source: Ambulatory Visit | Attending: Specialist | Admitting: Specialist

## 2015-07-12 DIAGNOSIS — M545 Low back pain: Secondary | ICD-10-CM

## 2015-07-12 MED ORDER — GADOBENATE DIMEGLUMINE 529 MG/ML IV SOLN
20.0000 mL | Freq: Once | INTRAVENOUS | Status: AC | PRN
  Administered 2015-07-12: 20 mL via INTRAVENOUS

## 2015-07-21 ENCOUNTER — Other Ambulatory Visit: Payer: Self-pay | Admitting: Family Medicine

## 2015-07-31 LAB — HM DIABETES EYE EXAM

## 2015-08-08 ENCOUNTER — Telehealth: Payer: Self-pay | Admitting: *Deleted

## 2015-08-08 MED ORDER — GLUCOSE BLOOD VI STRP: 1.0000 | ORAL_STRIP | Freq: Two times a day (BID) | Status: DC

## 2015-08-08 MED ORDER — PRODIGY VOICE BLOOD GLUCOSE W/DEVICE KIT: PACK | Status: DC

## 2015-08-08 MED ORDER — PRODIGY LANCETS 28G MISC: Status: DC

## 2015-08-08 NOTE — Telephone Encounter (Signed)
Pt states he need to get another BS monitor. Verified what type he is using pt states he has Prodigy. Inform ot will send rx to [facility] for BS monitor & supplies...Johny Chess

## 2015-08-08 NOTE — Telephone Encounter (Signed)
Pt states pharmacy states they did not receive rx for strips or lancets. Inform pt will refax to [facility].../lmb

## 2015-08-20 ENCOUNTER — Ambulatory Visit: Payer: Medicare Other | Admitting: Internal Medicine

## 2015-08-21 ENCOUNTER — Ambulatory Visit (INDEPENDENT_AMBULATORY_CARE_PROVIDER_SITE_OTHER): Payer: Medicare Other | Admitting: Internal Medicine

## 2015-08-21 ENCOUNTER — Encounter: Payer: Self-pay | Admitting: Internal Medicine

## 2015-08-21 ENCOUNTER — Other Ambulatory Visit (INDEPENDENT_AMBULATORY_CARE_PROVIDER_SITE_OTHER): Payer: Medicare Other

## 2015-08-21 VITALS — BP 118/70 | HR 72 | Temp 98.1°F | Resp 16 | Ht 71.0 in | Wt 243.0 lb

## 2015-08-21 DIAGNOSIS — IMO0002 Reserved for concepts with insufficient information to code with codable children: Secondary | ICD-10-CM

## 2015-08-21 DIAGNOSIS — D519 Vitamin B12 deficiency anemia, unspecified: Secondary | ICD-10-CM | POA: Diagnosis not present

## 2015-08-21 DIAGNOSIS — E1165 Type 2 diabetes mellitus with hyperglycemia: Secondary | ICD-10-CM

## 2015-08-21 DIAGNOSIS — E781 Pure hyperglyceridemia: Secondary | ICD-10-CM

## 2015-08-21 DIAGNOSIS — L609 Nail disorder, unspecified: Secondary | ICD-10-CM

## 2015-08-21 DIAGNOSIS — L602 Onychogryphosis: Secondary | ICD-10-CM

## 2015-08-21 DIAGNOSIS — Z23 Encounter for immunization: Secondary | ICD-10-CM

## 2015-08-21 DIAGNOSIS — E118 Type 2 diabetes mellitus with unspecified complications: Secondary | ICD-10-CM

## 2015-08-21 DIAGNOSIS — I2510 Atherosclerotic heart disease of native coronary artery without angina pectoris: Secondary | ICD-10-CM

## 2015-08-21 DIAGNOSIS — E7800 Pure hypercholesterolemia, unspecified: Secondary | ICD-10-CM

## 2015-08-21 DIAGNOSIS — I5022 Chronic systolic (congestive) heart failure: Secondary | ICD-10-CM

## 2015-08-21 LAB — URINALYSIS, ROUTINE W REFLEX MICROSCOPIC
BILIRUBIN URINE: NEGATIVE
LEUKOCYTES UA: NEGATIVE
NITRITE: NEGATIVE
Specific Gravity, Urine: >=1.03 — AB (ref 1.000–1.030)
Total Protein, Urine: NEGATIVE
UROBILINOGEN UA: 0.2 (ref 0.0–1.0)
Urine Glucose: NEGATIVE
pH: 5.5 (ref 5.0–8.0)

## 2015-08-21 LAB — COMPREHENSIVE METABOLIC PANEL
ALBUMIN: 4.1 g/dL (ref 3.5–5.2)
ALK PHOS: 45 U/L (ref 39–117)
ALT: 19 U/L (ref 0–53)
AST: 18 U/L (ref 0–37)
BUN: 16 mg/dL (ref 6–23)
CO2: 28 mEq/L (ref 19–32)
Calcium: 9.3 mg/dL (ref 8.4–10.5)
Chloride: 103 mEq/L (ref 96–112)
Creatinine, Ser: 1.04 mg/dL (ref 0.40–1.50)
GFR: 91.89 mL/min (ref >60.00)
Glucose, Bld: 103 mg/dL — ABNORMAL HIGH (ref 70–99)
Potassium: 4 mEq/L (ref 3.5–5.1)
SODIUM: 138 meq/L (ref 135–145)
TOTAL PROTEIN: 7.4 g/dL (ref 6.0–8.3)
Total Bilirubin: 0.3 mg/dL (ref 0.2–1.2)

## 2015-08-21 LAB — CBC WITH DIFFERENTIAL/PLATELET
Basophils Absolute: 0 10*3/uL (ref 0.0–0.1)
Basophils Relative: 0.7 % (ref 0.0–3.0)
Eosinophils Absolute: 0.2 10*3/uL (ref 0.0–0.7)
Eosinophils Relative: 3.8 % (ref 0.0–5.0)
HCT: 41.5 % (ref 39.0–52.0)
HEMOGLOBIN: 13.2 g/dL (ref 13.0–17.0)
Lymphocytes Relative: 30.9 % (ref 12.0–46.0)
Lymphs Abs: 1.9 10*3/uL (ref 0.7–4.0)
MCHC: 31.9 g/dL (ref 30.0–36.0)
MCV: 72.5 fl — ABNORMAL LOW (ref 78.0–100.0)
MONO ABS: 0.5 10*3/uL (ref 0.1–1.0)
Monocytes Relative: 8.3 % (ref 3.0–12.0)
Neutro Abs: 3.5 10*3/uL (ref 1.4–7.7)
Neutrophils Relative %: 56.3 % (ref 43.0–77.0)
Platelets: 224 10*3/uL (ref 150.0–400.0)
RBC: 5.72 Mil/uL (ref 4.22–5.81)
RDW: 14.9 % (ref 11.5–15.5)
WBC: 6.2 10*3/uL (ref 4.0–10.5)

## 2015-08-21 LAB — TSH: TSH: 1.14 u[IU]/mL (ref 0.35–4.50)

## 2015-08-21 LAB — LIPID PANEL
Cholesterol: 163 mg/dL (ref 0–200)
HDL: 26.9 mg/dL — ABNORMAL LOW (ref >39.00)
LDL Cholesterol: 106 mg/dL — ABNORMAL HIGH (ref 0–99)
NonHDL: 135.85
Total CHOL/HDL Ratio: 6
Triglycerides: 149 mg/dL (ref 0.0–149.0)
VLDL: 29.8 mg/dL (ref 0.0–40.0)

## 2015-08-21 LAB — MICROALBUMIN / CREATININE URINE RATIO
Creatinine,U: 297.6 mg/dL
MICROALB UR: 2 mg/dL — AB (ref 0.0–1.9)
Microalb Creat Ratio: 0.7 mg/g (ref 0.0–30.0)

## 2015-08-21 LAB — HEMOGLOBIN A1C: Hgb A1c MFr Bld: 6.5 % (ref 4.6–6.5)

## 2015-08-21 LAB — FOLATE: Folate: 12.4 ng/mL (ref >5.9)

## 2015-08-21 LAB — VITAMIN B12: VITAMIN B 12: 233 pg/mL (ref 211–911)

## 2015-08-21 MED ORDER — INSULIN LISPRO PROT & LISPRO (75-25 MIX) 100 UNIT/ML ~~LOC~~ SUSP: 45.0000 [IU] | Freq: Two times a day (BID) | SUBCUTANEOUS | Status: DC

## 2015-08-21 MED ORDER — CARVEDILOL 12.5 MG PO TABS: 12.5000 mg | ORAL_TABLET | Freq: Two times a day (BID) | ORAL | Status: DC

## 2015-08-21 MED ORDER — "INSULIN SYRINGE 29G X 1/2"" 1 ML MISC": Status: DC

## 2015-08-21 MED ORDER — SITAGLIPTIN PHOS-METFORMIN HCL 50-1000 MG PO TABS: 1.0000 | ORAL_TABLET | Freq: Two times a day (BID) | ORAL | Status: DC

## 2015-08-21 MED ORDER — PRAVASTATIN SODIUM 40 MG PO TABS: ORAL_TABLET | ORAL | Status: DC

## 2015-08-21 NOTE — Progress Notes (Signed)
Pre visit review using our clinic review tool, if applicable. No additional management support is needed unless otherwise documented below in the visit note. 

## 2015-08-21 NOTE — Progress Notes (Signed)
 Subjective:  Patient ID: [name], male    DOB: 11/17/1951  Age: 64 y.o. MRN: [id number]  CC: Hypertension; Diabetes; Hyperlipidemia; and Back Pain   HPI [name] presents for medical clearance for low back surgery as well as follow-up on diabetes, hypertension and his cholesterol level. He tells me that he are he has cardiac clearance from his cardiologist. He has chronic low back pain but offers no other complaints today.  Outpatient Prescriptions Prior to Visit  Medication Sig Dispense Refill  . Blood Glucose Monitoring Suppl (PRODIGY VOICE BLOOD GLUCOSE) W/DEVICE KIT Use to check blood sugars twice a day Dx e11.9 1 kit 0  . dorzolamide-timolol (COSOPT) 22.3-6.8 MG/ML ophthalmic solution Place 1 drop into the right eye 2 (two) times daily. 10 mL 1  . gabapentin (NEURONTIN) 300 MG capsule TAKE 1 CAPSULE (300 MG TOTAL) BY MOUTH 3 (THREE) TIMES DAILY. 270 capsule 0  . glucose blood test strip 1 each by Other route 2 (two) times daily. Use to check blood sugars twice a day Dx E11.9 100 each 3  . isosorbide mononitrate (IMDUR) 120 MG 24 hr tablet Take 1 tablet (120 mg total) by mouth daily. 30 tablet 11  . losartan (COZAAR) 50 MG tablet Take 1 tablet (50 mg total) by mouth daily. 30 tablet 11  . meloxicam (MOBIC) 15 MG tablet Take 1 tablet (15 mg total) by mouth daily. 30 tablet 0  . omega-3 acid ethyl esters (LOVAZA) 1 G capsule Take 2 capsules (2 g total) by mouth 2 (two) times daily. 120 capsule 3  . PRODIGY LANCETS 28G MISC Use to check blood sugars twice a day Dx e11.9 100 each 3  . carvedilol (COREG) 12.5 MG tablet TAKE 1 TABLET (12.5 MG TOTAL) BY MOUTH 2 (TWO) TIMES DAILY WITH A MEAL. 60 tablet 10  . insulin lispro protamine-lispro (HUMALOG 75/25 MIX) (75-25) 100 UNIT/ML SUSP injection Inject 45-85 Units into the skin 2 (two) times daily with a meal. Use 85 units every morning and use 45 units every evening 100 mL 11  . INSULIN SYRINGE 1CC/29G (B-D INSULIN SYRINGE) 29G X  1/2" 1 ML MISC Use as directed with insulin vials 100 each 11  . JENTADUETO 2.5-850 MG TABS TAKE 1 TABLET BY MOUTH 2 (TWO) TIMES DAILY. 180 tablet 3  . phenazopyridine (PYRIDIUM) 200 MG tablet Take 1 tablet (200 mg total) by mouth 3 (three) times daily as needed for pain. 10 tablet 0  . pravastatin (PRAVACHOL) 40 MG tablet TAKE 1 TABLET (40 MG TOTAL) BY MOUTH DAILY. DAILY 30 tablet 11  . traMADol (ULTRAM) 50 MG tablet Take 1 tablet (50 mg total) by mouth 3 (three) times daily. 90 tablet 1  . trimethoprim-polymyxin b (POLYTRIM) ophthalmic solution Apply 1 drop to eye 2 (two) times daily.     No facility-administered medications prior to visit.    ROS Review of Systems  Constitutional: Negative.  Negative for fever, chills, diaphoresis, appetite change and fatigue.  HENT: Negative.   Eyes: Positive for visual disturbance.  Respiratory: Negative.  Negative for cough, choking, chest tightness, shortness of breath and stridor.   Cardiovascular: Negative.  Negative for chest pain, palpitations and leg swelling.  Gastrointestinal: Negative.  Negative for nausea, vomiting, abdominal pain, diarrhea, constipation and blood in stool.  Endocrine: Negative.   Genitourinary: Positive for frequency. Negative for dysuria, urgency, hematuria, decreased urine volume and difficulty urinating.  Musculoskeletal: Positive for back pain. Negative for myalgias, joint swelling, arthralgias, gait problem, neck   pain and neck stiffness.  Skin: Negative.  Negative for rash.  Allergic/Immunologic: Negative.   Neurological: Negative.  Negative for dizziness.  Hematological: Negative.  Negative for adenopathy. Does not bruise/bleed easily.  Psychiatric/Behavioral: Negative.     Objective:  Ht 5' 11" (1.803 m)  BP Readings from Last 3 Encounters:  04/10/15 130/68  03/21/15 116/84  03/18/15 112/42    Wt Readings from Last 3 Encounters:  04/10/15 244 lb (110.678 kg)  03/21/15 244 lb (110.678 kg)  03/18/15 244  lb (110.678 kg)    Physical Exam  Constitutional: He is oriented to person, place, and time. No distress.  HENT:  Mouth/Throat: Oropharynx is clear and moist. No oropharyngeal exudate.  Eyes: Conjunctivae are normal. Right eye exhibits no discharge. Left eye exhibits no discharge. No scleral icterus.  Neck: Normal range of motion. Neck supple. No JVD present. No tracheal deviation present. No thyromegaly present.  Cardiovascular: Normal rate, regular rhythm, normal heart sounds and intact distal pulses.  Exam reveals no gallop and no friction rub.   No murmur heard. Pulmonary/Chest: Effort normal and breath sounds normal. No stridor. No respiratory distress. He has no wheezes. He has no rales. He exhibits no tenderness.  Abdominal: Soft. Bowel sounds are normal. He exhibits no distension and no mass. There is no tenderness. There is no rebound and no guarding.  Musculoskeletal: Normal range of motion. He exhibits no edema or tenderness.  Lymphadenopathy:    He has no cervical adenopathy.  Neurological: He is oriented to person, place, and time.  Skin: Skin is warm and dry. No rash noted. He is not diaphoretic. No erythema. No pallor.  Psychiatric: He has a normal mood and affect. His behavior is normal. Judgment and thought content normal.  Vitals reviewed.   Lab Results  Component Value Date   WBC 6.2 08/21/2015   HGB 13.2 08/21/2015   HCT 41.5 08/21/2015   PLT 224.0 08/21/2015   GLUCOSE 103* 08/21/2015   CHOL 163 08/21/2015   TRIG 149.0 08/21/2015   HDL 26.90* 08/21/2015   LDLDIRECT 82.7 04/04/2013   LDLCALC 106* 08/21/2015   ALT 19 08/21/2015   AST 18 08/21/2015   NA 138 08/21/2015   K 4.0 08/21/2015   CL 103 08/21/2015   CREATININE 1.04 08/21/2015   BUN 16 08/21/2015   CO2 28 08/21/2015   TSH 1.14 08/21/2015   PSA 1.61 03/31/2012   INR 0.95 08/17/2010   HGBA1C 6.5 08/21/2015   MICROALBUR 2.0* 08/21/2015    Mr Lumbar Spine W Wo Contrast  07/12/2015   CLINICAL  DATA:  Right hip and lower extremity pain. History of prior lumbar surgery and osteomyelitis.  BUN and creatinine were obtained on site at [facility] at  [address]  Results:  BUN 17 mg/dL,  Creatinine 1.2 mg/dL.  EXAM: MRI LUMBAR SPINE WITHOUT AND WITH CONTRAST  TECHNIQUE: Multiplanar and multiecho pulse sequences of the lumbar spine were obtained without and with intravenous contrast.  CONTRAST:  20 mL MULTIHANCE GADOBENATE DIMEGLUMINE 529 MG/ML IV SOLN  COMPARISON:  CT abdomen and pelvis 12/14/2013.  FINDINGS: Vertebral body height and alignment are maintained. There is solid interbody fusion across the L4-5 level. The facets also appear fused at L4-5. Bone marrow signal is unremarkable. The conus medullaris is normal in signal and position. Imaged intra-abdominal contents are unremarkable.  The T10-11 and T11-12 levels are imaged in the sagittal plane only. There is a small disc bulge at T10-11 but the central canal and  foramina appear open at each level.  T12-L1: Minimal disc bulge the left without central canal or foraminal narrowing.  L1-2: Moderate facet arthropathy is identified. No disc bulge or protrusion. The central canal and foramina are open.  L2-3: There is a shallow disc bulge with ligamentum flavum thickening and moderate facet degenerative disease. There is mild central canal stenosis. The foramina are open.  L3-4: Moderate to advanced bilateral facet degenerative change is present. There is bulky ligamentum flavum thickening. The patient has a diffuse broad-based disc bulge with a superimposed right foraminal protrusion. There is severe right foraminal narrowing. Moderately severe to severe central canal stenosis is also present. The left foramen is mildly narrowed.  L4-5: Status post discectomy and fusion. The central canal and foramina are patent.  L5-S1: There is advanced facet degenerative disease. The patient has a diffuse broad-based disc bulge somewhat more prominent to  the right. Disc contacts both descending S1 roots and deflects the right S1 root. The S1 roots do not appear compressed. The foramina are severely narrowed.  IMPRESSION: Severe right foraminal narrowing at L3-4 due to disc. There is moderately severe to severe central canal stenosis overall at this level due to a combination of ligamentum flavum thickening and disc.  Disc contacts the descending S1 roots at L5-S1 and slightly deflects the right S1 root. The foramina are severely narrowed at this level due to disc.  Status post L4-5 fusion. The central canal and foramina are widely patent.   Electronically Signed   By: Thomas  Dalessio M.D.   On: 07/12/2015 16:28    Assessment & Plan:   Ulysse was seen today for hypertension, diabetes, hyperlipidemia and back pain.  Diagnoses and all orders for this visit:  Atherosclerosis of native coronary artery of native heart without angina pectoris- he has been cleared by cardiology for surgery, he has no symptoms of this today, will try to get better control of his LDL level with an addition of zetia -     pravastatin (PRAVACHOL) 40 MG tablet; TAKE 1 TABLET (40 MG TOTAL) BY MOUTH DAILY. DAILY -     Lipid panel; Future  Type II diabetes mellitus with complication, uncontrolled- his blood sugars are well-controlled, his insurance does not cover Tradjenta so I have changed him to the preferred option on his insurance which is Janumet. -     pravastatin (PRAVACHOL) 40 MG tablet; TAKE 1 TABLET (40 MG TOTAL) BY MOUTH DAILY. DAILY -     insulin lispro protamine-lispro (HUMALOG 75/25 MIX) (75-25) 100 UNIT/ML SUSP injection; Inject 45-85 Units into the skin 2 (two) times daily with a meal. Use 85 units every morning and use 45 units every evening -     INSULIN SYRINGE 1CC/29G (B-D INSULIN SYRINGE) 29G X 1/2" 1 ML MISC; Use as directed with insulin vials -     Lipid panel; Future -     Urinalysis, Routine w reflex microscopic (not at [facility]); Future -     Microalbumin /  creatinine urine ratio; Future -     Hemoglobin A1c; Future -     Comprehensive metabolic panel; Future -     sitaGLIPtin-metformin (JANUMET) 50-1000 MG per tablet; Take 1 tablet by mouth 2 (two) times daily with a meal. -     Blood Glucose Monitoring Suppl (ONETOUCH VERIO IQ SYSTEM) W/DEVICE KIT; 1 Act by Does not apply route 2 (two) times daily. -     Blood Glucose Calibration (ONETOUCH VERIO) SOLN; 1 Act by In Vitro route   2 (two) times daily. -     glucose blood (ONETOUCH VERIO) test strip; Use BID  Chronic systolic congestive heart failure -     carvedilol (COREG) 12.5 MG tablet; Take 1 tablet (12.5 mg total) by mouth 2 (two) times daily with a meal.  B12 deficiency anemia- he has not been compliant with his B12 replacement and his B12 level is trending downward, I have asked him to start high-dose oral B12 replacement daily. -     CBC with Differential/Platelet; Future -     Vitamin B12; Future -     Folate; Future -     Methylmalonic acid, serum; Future -     cyanocobalamin 2000 MCG tablet; Take 1 tablet (2,000 mcg total) by mouth daily.  Pure hypercholesterolemia- he has not achieved his LDL goal, will add Zetia to the pravastatin. -     pravastatin (PRAVACHOL) 40 MG tablet; TAKE 1 TABLET (40 MG TOTAL) BY MOUTH DAILY. DAILY -     Lipid panel; Future -     TSH; Future -     Comprehensive metabolic panel; Future  Pure hyperglyceridemia- improvement noted -     Lipid panel; Future  Overgrown toenails -     Ambulatory referral to Podiatry  Need for prophylactic vaccination and inoculation against influenza -     Flu vaccine HIGH DOSE PF   I have discontinued Mr. Peckenpaugh's phenazopyridine, trimethoprim-polymyxin b, and traMADol. I have also changed his JENTADUETO to sitaGLIPtin-metformin. Additionally, I am having him start on cyanocobalamin, ONETOUCH VERIO IQ SYSTEM, ONETOUCH VERIO, and glucose blood. Lastly, I am having him maintain his dorzolamide-timolol, isosorbide  mononitrate, losartan, meloxicam, omega-3 acid ethyl esters, gabapentin, PRODIGY VOICE BLOOD GLUCOSE, PRODIGY LANCETS 28G, glucose blood, pravastatin, carvedilol, insulin lispro protamine-lispro, INSULIN SYRINGE 1CC/29G, HYDROcodone-acetaminophen, and Vitamin D3.  Meds ordered this encounter  Medications  . pravastatin (PRAVACHOL) 40 MG tablet    Sig: TAKE 1 TABLET (40 MG TOTAL) BY MOUTH DAILY. DAILY    Dispense:  30 tablet    Refill:  11  . carvedilol (COREG) 12.5 MG tablet    Sig: Take 1 tablet (12.5 mg total) by mouth 2 (two) times daily with a meal.    Dispense:  60 tablet    Refill:  5  . insulin lispro protamine-lispro (HUMALOG 75/25 MIX) (75-25) 100 UNIT/ML SUSP injection    Sig: Inject 45-85 Units into the skin 2 (two) times daily with a meal. Use 85 units every morning and use 45 units every evening    Dispense:  100 mL    Refill:  11  . INSULIN SYRINGE 1CC/29G (B-D INSULIN SYRINGE) 29G X 1/2" 1 ML MISC    Sig: Use as directed with insulin vials    Dispense:  100 each    Refill:  11  . HYDROcodone-acetaminophen (NORCO) 7.5-325 MG per tablet    Sig: TAKE 1 TABLET BY MOUTH EVERY 4 TO 6 HOURS AS NEEDED FOR PAIN MAX 8 TABS PER DAY  . Cholecalciferol (VITAMIN D3) 1000 UNITS CAPS    Sig: Take by mouth.  . sitaGLIPtin-metformin (JANUMET) 50-1000 MG per tablet    Sig: Take 1 tablet by mouth 2 (two) times daily with a meal.    Dispense:  180 tablet    Refill:  1  . cyanocobalamin 2000 MCG tablet    Sig: Take 1 tablet (2,000 mcg total) by mouth daily.    Dispense:  90 tablet    Refill:  3  . Blood Glucose   Monitoring Suppl (ONETOUCH VERIO IQ SYSTEM) W/DEVICE KIT    Sig: 1 Act by Does not apply route 2 (two) times daily.    Dispense:  2 kit    Refill:  0  . Blood Glucose Calibration (ONETOUCH VERIO) SOLN    Sig: 1 Act by In Vitro route 2 (two) times daily.    Dispense:  1 each    Refill:  11  . glucose blood (ONETOUCH VERIO) test strip    Sig: Use BID    Dispense:  100 each     Refill:  12     Follow-up: Return in about 4 months (around 12/21/2015).  [name], MD

## 2015-08-21 NOTE — Patient Instructions (Signed)

## 2015-08-22 ENCOUNTER — Encounter: Payer: Self-pay | Admitting: Internal Medicine

## 2015-08-22 MED ORDER — EZETIMIBE 10 MG PO TABS: 10.0000 mg | ORAL_TABLET | Freq: Every day | ORAL | Status: DC

## 2015-08-22 MED ORDER — ONETOUCH VERIO VI SOLN: 1.0000 | Freq: Two times a day (BID) | Status: DC

## 2015-08-22 MED ORDER — CYANOCOBALAMIN 2000 MCG PO TABS: 2000.0000 ug | ORAL_TABLET | Freq: Every day | ORAL | Status: DC

## 2015-08-22 MED ORDER — ONETOUCH VERIO IQ SYSTEM W/DEVICE KIT: 1.0000 | PACK | Freq: Two times a day (BID) | Status: DC

## 2015-08-22 MED ORDER — GLUCOSE BLOOD VI STRP: ORAL_STRIP | Status: DC

## 2015-08-23 LAB — METHYLMALONIC ACID, SERUM: Methylmalonic Acid, Quant: 102 nmol/L (ref 87–318)

## 2015-08-28 ENCOUNTER — Telehealth: Payer: Self-pay | Admitting: Internal Medicine

## 2015-08-28 DIAGNOSIS — D519 Vitamin B12 deficiency anemia, unspecified: Secondary | ICD-10-CM

## 2015-08-28 MED ORDER — CYANOCOBALAMIN 2000 MCG PO TABS: 2000.0000 ug | ORAL_TABLET | Freq: Every day | ORAL | Status: DC

## 2015-08-28 NOTE — Telephone Encounter (Signed)
Notified pt with md response. He is wanting refill on his b12 tablets. Inform pt will [facility].../lmb

## 2015-08-28 NOTE — Telephone Encounter (Signed)
Please call pt regarding labs

## 2015-08-28 NOTE — Telephone Encounter (Signed)
Left msg on triage requesting lab results from last week...[name]

## 2015-08-28 NOTE — Telephone Encounter (Signed)
His B12 level is getting low but the other labs are okay.

## 2015-09-11 ENCOUNTER — Telehealth: Payer: Self-pay | Admitting: Internal Medicine

## 2015-09-11 NOTE — Telephone Encounter (Signed)
Sherrie from [facility] called regarding patient's surgery clearance. Patient was seen on 8/30 for this but they have not heard back from [place] yet. Can you please call [facility] at [phone]

## 2015-09-12 NOTE — Telephone Encounter (Signed)
Called [name] back gave her md response. She is going to refax clearance back to md. Inform her to fax it to my att & i will give to md.../lmb

## 2015-09-12 NOTE — Telephone Encounter (Signed)
I have not seen their form yet

## 2015-09-19 ENCOUNTER — Encounter: Payer: Self-pay | Admitting: Podiatry

## 2015-09-19 ENCOUNTER — Ambulatory Visit (INDEPENDENT_AMBULATORY_CARE_PROVIDER_SITE_OTHER): Payer: Medicare Other | Admitting: Podiatry

## 2015-09-19 VITALS — BP 156/62 | HR 69 | Resp 12

## 2015-09-19 DIAGNOSIS — E1140 Type 2 diabetes mellitus with diabetic neuropathy, unspecified: Secondary | ICD-10-CM | POA: Diagnosis not present

## 2015-09-19 DIAGNOSIS — E1151 Type 2 diabetes mellitus with diabetic peripheral angiopathy without gangrene: Secondary | ICD-10-CM

## 2015-09-19 DIAGNOSIS — M79673 Pain in unspecified foot: Secondary | ICD-10-CM | POA: Diagnosis not present

## 2015-09-19 DIAGNOSIS — B351 Tinea unguium: Secondary | ICD-10-CM | POA: Diagnosis not present

## 2015-09-19 NOTE — Patient Instructions (Signed)
Today her diabetic foot examination revealed decreased pulsation in your feet with decreased feeling. The toenails are deformed associated with fungal infection. I recommended you return every 3 months for nail debridement or sooner if you have a specific concern  Diabetes and Foot Care Diabetes may cause you to have problems because of poor blood supply (circulation) to your feet and legs. This may cause the skin on your feet to become thinner, break easier, and heal more slowly. Your skin may become dry, and the skin may peel and crack. You may also have nerve damage in your legs and feet causing decreased feeling in them. You may not notice minor injuries to your feet that could lead to infections or more serious problems. Taking care of your feet is one of the most important things you can do for yourself.  HOME CARE INSTRUCTIONS  Wear shoes at all times, even in the house. Do not go barefoot. Bare feet are easily injured.  Check your feet daily for blisters, cuts, and redness. If you cannot see the bottom of your feet, use a mirror or ask someone for help.  Wash your feet with warm water (do not use hot water) and mild soap. Then pat your feet and the areas between your toes until they are completely dry. Do not soak your feet as this can dry your skin.  Apply a moisturizing lotion or petroleum jelly (that does not contain alcohol and is unscented) to the skin on your feet and to dry, brittle toenails. Do not apply lotion between your toes.  Trim your toenails straight across. Do not dig under them or around the cuticle. File the edges of your nails with an emery board or nail file.  Do not cut corns or calluses or try to remove them with medicine.  Wear clean socks or stockings every day. Make sure they are not too tight. Do not wear knee-high stockings since they may decrease blood flow to your legs.  Wear shoes that fit properly and have enough cushioning. To break in new shoes, wear them  for just a few hours a day. This prevents you from injuring your feet. Always look in your shoes before you put them on to be sure there are no objects inside.  Do not cross your legs. This may decrease the blood flow to your feet.  If you find a minor scrape, cut, or break in the skin on your feet, keep it and the skin around it clean and dry. These areas may be cleansed with mild soap and water. Do not cleanse the area with peroxide, alcohol, or iodine.  When you remove an adhesive bandage, be sure not to damage the skin around it.  If you have a wound, look at it several times a day to make sure it is healing.  Do not use heating pads or hot water bottles. They may burn your skin. If you have lost feeling in your feet or legs, you may not know it is happening until it is too late.  Make sure your health care provider performs a complete foot exam at least annually or more often if you have foot problems. Report any cuts, sores, or bruises to your health care provider immediately. SEEK MEDICAL CARE IF:   You have an injury that is not healing.  You have cuts or breaks in the skin.  You have an ingrown nail.  You notice redness on your legs or feet.  You feel burning or  tingling in your legs or feet.  You have pain or cramps in your legs and feet.  Your legs or feet are numb.  Your feet always feel cold. SEEK IMMEDIATE MEDICAL CARE IF:   There is increasing redness, swelling, or pain in or around a wound.  There is a red line that goes up your leg.  Pus is coming from a wound.  You develop a fever or as directed by your health care provider.  You notice a bad smell coming from an ulcer or wound. Document Released: 12/05/2000 Document Revised: 08/10/2013 Document Reviewed: 05/17/2013 [facility] Patient Information 2015 [place], [place]. This information is not intended to replace advice given to you by your health care provider. Make sure you discuss any questions you have with  your health care provider.  return every 3 months for nail debridement

## 2015-09-19 NOTE — Progress Notes (Signed)
   Subjective:    Patient ID: [name], male    DOB: 18-Feb-1951, 64 y.o.   MRN: [id number]  HPI    This patient today presents today complaining of thick discolored toenails in the right and left feet that have gradually become more deformed over time. Patient has not been able to adequately trim his toenails and is requesting debridement of his toenails. He does not recall any recent podiatric care. He is a known type II diabetic and denies any history of ulceration or amputation   Review of Systems  Respiratory: Positive for shortness of breath.   Cardiovascular: Positive for chest pain.  Musculoskeletal: Positive for myalgias and back pain.  Skin: Positive for color change.  Neurological: Positive for weakness and numbness.  All other systems reviewed and are negative.      Objective:   Physical Exam  Orientated 3  Vascular: DP pulses 0/4 bilaterally PT pulses 0/4 bilaterally Capillary reflex within normal limits  Neurological: Sensation to 10 g monofilament wire intact 2/5 right and 3/5 left Vibratory sensation nonreactive bilaterally Ankle reflexes weakly reactive bilaterally  Dermatological: No open skin lesions noted bilaterally Dry skin bilaterally The toenails are elongated, brittle, discolored, hypertrophic 6-10  Musculoskeletal: No deformities noted bilaterally There is no restriction ankle, subtalar, midtarsal joints bilaterally       Assessment & Plan:   Assessment: Diabetic peripheral arterial disease Diabetic peripheral neuropathy Mycotic toenails 6-10  Plan: Today I reviewed the results of examination today made patient aware that he had decreased circulation and feeling in his right and left feet.. I discussed generalized diabetic foot care and provided information and after visit summary  The toenails 6-10 were debrided mechanically and electrically without any bleeding  Reappoint 3 months

## 2015-09-26 ENCOUNTER — Encounter: Payer: Self-pay | Admitting: Internal Medicine

## 2015-09-26 ENCOUNTER — Ambulatory Visit (INDEPENDENT_AMBULATORY_CARE_PROVIDER_SITE_OTHER): Payer: Medicare Other | Admitting: Internal Medicine

## 2015-09-26 VITALS — BP 118/68 | HR 72 | Temp 98.4°F | Resp 16 | Ht 71.0 in | Wt 238.0 lb

## 2015-09-26 DIAGNOSIS — M659 Synovitis and tenosynovitis, unspecified: Secondary | ICD-10-CM

## 2015-09-26 DIAGNOSIS — M6588 Other synovitis and tenosynovitis, other site: Secondary | ICD-10-CM | POA: Diagnosis not present

## 2015-09-26 NOTE — Patient Instructions (Signed)
Use an anti-inflammatory cream such as Aspercreme or Zostrix cream twice a day to the affected area as needed. In lieu of this warm moist compresses or  hot water bottle can be used. Do not apply ice . Glucosamine sulfate 1500 mg daily for joint symptomsuntil seen by the Hand Surgeon. This will rehydrate the cartilages. The Hand Surgery referral will be scheduled and you'll be notified of the time.Please call the Referral Co-Ordinator @ [phone] if you have not been notified of appointment time within 7-10 days.

## 2015-09-26 NOTE — Progress Notes (Signed)
   Subjective:    Patient ID: [name], male    DOB: 11-Nov-1951, 64 y.o.   MRN: [id number]  HPI 2 weeks ago he awoke with a popping sensation in the right thumb. This progressed to swelling and aching pain up to level V. There had been no trigger injury prior to the onset of symptoms. There was no repetitive action tocolysis although he does use his right hand more than the left.  He has no history of gout; his mother does.     Review of Systems He denies any other joint swelling, redness, pain. New progress he has no numbness, tingling, weakness in extremities  He also denies fever, chills, sweats, weight loss      Objective:   Physical Exam Pertinent or positive findings include: He is blind ; there is dramatic distortion of the right eye. There is subtle swelling of the right thumb particularly at the DIP joint. With range of motion he has pain at the thumb MCP joint.  General appearance :adequately nourished; in no distress.  Heart:  Normal rate and regular rhythm. S1 and S2 normal without gallop, murmur, click, rub or other extra sounds    Lungs:Chest clear to auscultation; no wheezes, rhonchi,rales ,or rubs present.No increased work of breathing.   Abdomen: Protuberant; bowel sounds normal, soft and non-tender without masses, organomegaly or hernias noted.  No guarding or rebound.  Vascular : all pulses equal ; no bruits present.  Skin:Warm & dry.  Intact without suspicious lesions or rashes ; no tenting   Lymphatic: No lymphadenopathy is noted about the head, neck, axilla, orepitrochlear areas.   Neuro: Strength, tone  normal.     Assessment & Plan:  #1 right thumb tenosynovitis  See orders and recommendations

## 2015-09-26 NOTE — Progress Notes (Signed)
Pre visit review using our clinic review tool, if applicable. No additional management support is needed unless otherwise documented below in the visit note. 

## 2015-10-09 ENCOUNTER — Other Ambulatory Visit (HOSPITAL_COMMUNITY): Payer: Self-pay | Admitting: Specialist

## 2015-10-12 ENCOUNTER — Telehealth: Payer: Self-pay | Admitting: Internal Medicine

## 2015-10-12 NOTE — Telephone Encounter (Signed)
Pt wife called in and wants to know why pt is being referred to a surgeon?  Can you call her Janace Hoard when you get a chance??    Best number is [phone]

## 2015-10-16 NOTE — Telephone Encounter (Signed)
Pt spouse informed that [facility] is ready to schedule the appt (per [place]). Number to [facility] and Address given to pt spouse.  Thanked for their time and patience.

## 2015-10-16 NOTE — Telephone Encounter (Signed)
There is no DPR on file to reference. She is on there as a contact, but no documentation either way,.

## 2015-10-16 NOTE — Telephone Encounter (Signed)
I have spoke to the pt and verified with the pt it was okay to speak to the wife.  Spouse is upset:  1. The note below was not detailed and not the question that she had. They wanted to know about the referral for the hand surgeon.  2. That we would not give info to the spouse due to no DPR on file.  3. Referral was not and has not been completed.   Spoke to [place] and she is getting the pt scheduled and will contact today.

## 2015-10-16 NOTE — Telephone Encounter (Signed)
Without a DPR I can not disclose any information to the spouse regarding the below question.   Tried to call but was sent to voicemail. LVM for pt to call back as soon as possible.  On both numbers listed.

## 2015-10-18 NOTE — Telephone Encounter (Signed)
Spoke w/pt's spouse. Pt scheduled to see Dr. Erlinda Hong. They are aware.

## 2015-10-23 ENCOUNTER — Encounter (HOSPITAL_COMMUNITY): Payer: Self-pay

## 2015-10-23 ENCOUNTER — Encounter (HOSPITAL_COMMUNITY)
Admission: RE | Admit: 2015-10-23 | Discharge: 2015-10-23 | Disposition: A | Discharge status: Home / Self Care | Payer: Medicare Other | Source: Ambulatory Visit | Attending: Specialist | Admitting: Specialist

## 2015-10-23 DIAGNOSIS — M5126 Other intervertebral disc displacement, lumbar region: Secondary | ICD-10-CM | POA: Diagnosis not present

## 2015-10-23 DIAGNOSIS — H548 Legal blindness, as defined in USA: Secondary | ICD-10-CM | POA: Diagnosis not present

## 2015-10-23 DIAGNOSIS — G4733 Obstructive sleep apnea (adult) (pediatric): Secondary | ICD-10-CM | POA: Diagnosis not present

## 2015-10-23 DIAGNOSIS — I10 Essential (primary) hypertension: Secondary | ICD-10-CM | POA: Diagnosis not present

## 2015-10-23 DIAGNOSIS — D62 Acute posthemorrhagic anemia: Secondary | ICD-10-CM | POA: Diagnosis not present

## 2015-10-23 DIAGNOSIS — R531 Weakness: Secondary | ICD-10-CM | POA: Diagnosis not present

## 2015-10-23 DIAGNOSIS — M5127 Other intervertebral disc displacement, lumbosacral region: Secondary | ICD-10-CM | POA: Diagnosis not present

## 2015-10-23 DIAGNOSIS — Z794 Long term (current) use of insulin: Secondary | ICD-10-CM | POA: Diagnosis not present

## 2015-10-23 DIAGNOSIS — M65311 Trigger thumb, right thumb: Secondary | ICD-10-CM | POA: Diagnosis not present

## 2015-10-23 DIAGNOSIS — E785 Hyperlipidemia, unspecified: Secondary | ICD-10-CM | POA: Diagnosis not present

## 2015-10-23 DIAGNOSIS — I2510 Atherosclerotic heart disease of native coronary artery without angina pectoris: Secondary | ICD-10-CM | POA: Diagnosis not present

## 2015-10-23 DIAGNOSIS — M4806 Spinal stenosis, lumbar region: Secondary | ICD-10-CM | POA: Diagnosis not present

## 2015-10-23 DIAGNOSIS — E11649 Type 2 diabetes mellitus with hypoglycemia without coma: Secondary | ICD-10-CM | POA: Diagnosis not present

## 2015-10-23 DIAGNOSIS — R339 Retention of urine, unspecified: Secondary | ICD-10-CM | POA: Diagnosis not present

## 2015-10-23 DIAGNOSIS — Z79899 Other long term (current) drug therapy: Secondary | ICD-10-CM | POA: Diagnosis not present

## 2015-10-23 DIAGNOSIS — I509 Heart failure, unspecified: Secondary | ICD-10-CM | POA: Diagnosis not present

## 2015-10-23 HISTORY — DX: Sleep apnea, unspecified: G47.30

## 2015-10-23 HISTORY — DX: Other complications of anesthesia, initial encounter: T88.59XA

## 2015-10-23 HISTORY — DX: Cardiac murmur, unspecified: R01.1

## 2015-10-23 HISTORY — DX: Adverse effect of unspecified anesthetic, initial encounter: T41.45XA

## 2015-10-23 HISTORY — DX: Trigger finger, unspecified finger: M65.30

## 2015-10-23 HISTORY — DX: Unspecified staphylococcus as the cause of diseases classified elsewhere: B95.8

## 2015-10-23 HISTORY — DX: Unspecified osteoarthritis, unspecified site: M19.90

## 2015-10-23 HISTORY — DX: Presence of spectacles and contact lenses: Z97.3

## 2015-10-23 LAB — COMPREHENSIVE METABOLIC PANEL
ALBUMIN: 3.4 g/dL — AB (ref 3.5–5.0)
ALT: 16 U/L — ABNORMAL LOW (ref 17–63)
ANION GAP: 8 (ref 5–15)
AST: 21 U/L (ref 15–41)
Alkaline Phosphatase: 42 U/L (ref 38–126)
BILIRUBIN TOTAL: 0.8 mg/dL (ref 0.3–1.2)
BUN: 14 mg/dL (ref 6–20)
CO2: 24 mmol/L (ref 22–32)
Calcium: 8.9 mg/dL (ref 8.9–10.3)
Chloride: 105 mmol/L (ref 101–111)
Creatinine, Ser: 1.13 mg/dL (ref 0.61–1.24)
GFR calc Af Amer: >60 mL/min (ref >60)
Glucose, Bld: 98 mg/dL (ref 65–99)
POTASSIUM: 4.3 mmol/L (ref 3.5–5.1)
Sodium: 137 mmol/L (ref 135–145)
TOTAL PROTEIN: 6.5 g/dL (ref 6.5–8.1)

## 2015-10-23 LAB — GLUCOSE, CAPILLARY: GLUCOSE-CAPILLARY: 89 mg/dL (ref 65–99)

## 2015-10-23 LAB — CBC
HEMATOCRIT: 36.6 % — AB (ref 39.0–52.0)
Hemoglobin: 11.9 g/dL — ABNORMAL LOW (ref 13.0–17.0)
MCH: 22.8 pg — ABNORMAL LOW (ref 26.0–34.0)
MCHC: 32.5 g/dL (ref 30.0–36.0)
MCV: 70 fL — AB (ref 78.0–100.0)
PLATELETS: 232 10*3/uL (ref 150–400)
RBC: 5.23 MIL/uL (ref 4.22–5.81)
RDW: 14.9 % (ref 11.5–15.5)
WBC: 6.7 10*3/uL (ref 4.0–10.5)

## 2015-10-23 LAB — SURGICAL PCR SCREEN
MRSA, PCR: NEGATIVE
STAPHYLOCOCCUS AUREUS: POSITIVE — AB

## 2015-10-23 NOTE — Progress Notes (Signed)
Patient states that he does not check his blood sugar every day but tries to check it every other day.  Educated patient on importance of checking blood sugar 4 times day prior to surgery and instructed patient to contact PCP about insulin dosage prior to procedure.  Patient instructed to take 31 units night prior to surgery if unable to get in touch with PCP.

## 2015-10-23 NOTE — Progress Notes (Signed)
Mupirocin Rx called into [facility] on [address] for positive PCR of Staph. Left message on pt's voicemail of result and need to pick up Rx and start using it.

## 2015-10-23 NOTE — Progress Notes (Signed)
   10/23/15 1600  OBSTRUCTIVE SLEEP APNEA  Have you ever been diagnosed with sleep apnea through a sleep study? No  Do you snore loudly (loud enough to be heard through closed doors)?  1  Do you often feel tired, fatigued, or sleepy during the daytime (such as falling asleep during driving or talking to someone)? 0  Has anyone observed you stop breathing during your sleep? 1  Do you have, or are you being treated for high blood pressure? 1  BMI more than 35 kg/m2? 0  Age > 50 (1-yes) 1  Neck circumference greater than:Male 16 inches or larger, Male 17inches or larger? 1  Male Gender (Yes=1) 1  Obstructive Sleep Apnea Score 6

## 2015-10-23 NOTE — Pre-Procedure Instructions (Addendum)
[place]  10/23/2015     Your procedure is scheduled on: Friday October 26, 2015 at 12:30 PM.  Report to [facility] Admitting at 10:30 AM.  Call this number if you have problems the morning of surgery: [phone]   Remember:  Do not eat food or drink liquids after midnight.  Take these medicines the morning of surgery with A SIP OF WATER : Carvediolol (Coreg),  Gabapentin (Neurontin), Hydrocodone if needed, Isosorbide (Imdur), Tramadol (Ultram) if needed   Stop taking any vitamins, herbal medications, Meloxicam/Mobic, Lovaza/Fish Oil, Ibuprofen, Advil, Motrin, Aleve, Goodys, BC Powder, etc   Do NOT take any diabetic pills the morning of your surgery (NO Zetia)   Please contact your PCP or Endocrinologist about how much insulin to take the night before your surgery; if unable to contact only take 31 Units of Novolog insulin    How to Manage Your Diabetes Before Surgery   Why is it important to control my blood sugar before and after surgery?   Improving blood sugar levels before and after surgery helps healing and can limit problems.  A way of improving blood sugar control is eating a healthy diet by:  - Eating less sugar and carbohydrates  - Increasing activity/exercise  - Talk with your doctor about reaching your blood sugar goals  High blood sugars (greater than 180 mg/dL) can raise your risk of infections and slow down your recovery so you will need to focus on controlling your diabetes during the weeks before surgery.  Make sure that the doctor who takes care of your diabetes knows about your planned surgery including the date and location.  How do I manage my blood sugars before surgery?   Check your blood sugar at least 4 times a day, 2 days before surgery to make sure that they are not too high or low.   Check your blood sugar the morning of your surgery when you wake up and every 2 hours until you get to the Short-Stay unit.  If your blood sugar is  less than 70 mg/dL, you will need to treat for low blood sugar by:  Treat a low blood sugar (less than 70 mg/dL) with 1/2 cup of clear juice (cranberry or apple), 4 glucose tablets, OR glucose gel.  Recheck blood sugar in 15 minutes after treatment (to make sure it is greater than 70 mg/dL).  If blood sugar is not greater than 70 mg/dL on re-check, call [phone] for further instructions.   Report your blood sugar to the Short-Stay nurse when you get to Short-Stay.  References:  [facility], 2007 "How to Manage your Diabetes Before and After Surgery".  What do I do about my diabetes medications?   Do not take oral diabetes medicines (pills) the morning of surgery.   THE NIGHT BEFORE SURGERY, take 31 units of Humalog Insulin. (If you have not been able to get in touch with PCP or endocrinologist).   THE MORNING OF SURGERY, DO NOT take any insulin    Do not wear jewelry.  Do not wear lotions, powders, or cologne.    Men may shave face and neck.  Do not bring valuables to the hospital.  [facility] is not responsible for any belongings or valuables.  Contacts, dentures or bridgework may not be worn into surgery.  Leave your suitcase in the car.  After surgery it may be brought to your room.  For patients admitted to the hospital, discharge time  will be determined by your treatment team.  Patients discharged the day of surgery will not be allowed to drive home.   Name and phone number of your driver:    Special instructions:  Shower using CHG soap the night before and the morning of your surgery  Please read over the following fact sheets that you were given. Pain Booklet, Coughing and Deep Breathing, MRSA Information and Surgical Site Infection Prevention

## 2015-10-23 NOTE — Progress Notes (Signed)
Patient and wife requested to set up a password for when patient is admitted to the floor after surgery.  Password requested is "Elder" .

## 2015-10-23 NOTE — Progress Notes (Signed)
PCP - Dr. [name] Cardiologist - Dr. Marlou Porch  EKG - 12/27/14 CXR - denies  Echo- denies Stress test - 08/23/2013  Cardiac Cath - 2005/2006 - 2 stents placed  Patient denies shortness of breath and chest pain at PAT appointment.

## 2015-10-24 ENCOUNTER — Other Ambulatory Visit: Payer: Self-pay | Admitting: Internal Medicine

## 2015-10-24 DIAGNOSIS — G4730 Sleep apnea, unspecified: Secondary | ICD-10-CM | POA: Insufficient documentation

## 2015-10-24 LAB — HEMOGLOBIN A1C
HEMOGLOBIN A1C: 6.7 % — AB (ref 4.8–5.6)
MEAN PLASMA GLUCOSE: 146 mg/dL

## 2015-10-24 NOTE — Progress Notes (Addendum)
Anesthesia Chart Review:  Pt is 64 year old male scheduled for L3-4 lumbar laminectomy, L5-S1 lateral recess decompression on 10/26/2015 with Dr. Louanne Skye.   Cardiologist Dr. Candee Furbish, last office visit 12/22/14. PCP Dr. [name].   PMH includes: CAD, CHF, HTN, DM, hyperlipidemia, heart murmur, OSA, glaucoma, legally blind. Never smoker. BMI 34. S/p laparoscopic cholecystectomy 09/15/12.   Medications include: carvedilol, zetia, humalog, imdur, losartan, pravastatin  Preoperative labs reviewed.  HgbA1c 6.7, glucose 98.   EKG 12/22/14: NSR. RAD. Low voltage QRS. Cannot rule out anterior infarct, age undetermined.   Nuclear stress test 08/23/13: Normal stress nuclear study. There is no evidence of ischemia. LV Ejection Fraction: 63%. LV Wall Motion: NL LV Function; NL Wall Motion.   Cardiac cath 11/19/07:  1. A 95% proximal circumflex stenosis - Status post successful percutaneous coronary intervention/bare-metal stent implantation 2. Proximal 95% stenosis of the right coronary artery in a diffusely diseased vessel. 3. Distal LAD 80% stenosis at the apex and 4 small diagonal branches which are diffusely diseased.. 4. Normal left ventricular ejection fraction estimated 65% with no regional wall motion abnormalities.   Pt has cardiac clearance from Dr. Marlou Porch at moderate risk. [name] is aware of upcoming surgery.   If no changes, I anticipate pt can proceed with surgery as scheduled.   Willeen Cass, FNP-BC [facility]/Anesthesiology Phone: [phone] 10/24/2015 4:01 PM

## 2015-10-25 MED ORDER — CHLORHEXIDINE GLUCONATE 4 % EX LIQD: 60.0000 mL | Freq: Once | CUTANEOUS | Status: DC

## 2015-10-25 MED ORDER — CEFAZOLIN SODIUM-DEXTROSE 2-3 GM-% IV SOLR
2.0000 g | INTRAVENOUS | Status: AC
  Administered 2015-10-26 (×2): 2 g via INTRAVENOUS
  Filled 2015-10-25: qty 50

## 2015-10-25 NOTE — H&P (Addendum)
[name] is an 64 y.o. male.   Chief Complaint:  Low back pain with lower ext radiculopathy HPI: patient continues to have above complaint.  Failed conservative treatment.  Scan showed L3-4 and L5-S1 HNP/stenosis. Seen in the office one week ago with findings of right trigger thumb and underwent cortisone injection into the right thumb flexor tendon sheath at the A-1 pulley level. Now with recurrign pain and triggering of the right thumb  Flexor tendon. As he is under general anesthesia will perform right trigger finger release. In the same setting.    Past Medical History  Diagnosis Date  . Glaucoma   . Ulcer   . HTN (hypertension)   . Hypercholesterolemia   . Diabetes mellitus     Type II  . CHF (congestive heart failure) ([place])   . CAD (coronary artery disease)   . LBP (low back pain)   . Legally blind   . Complication of anesthesia     "lungs filled up with fluid"   . Staph infection     "from back surgery"  . Wears glasses     "to protect cornea"  . Heart murmur   . Sleep apnea   . Trigger finger   . Arthritis     "mild arthritis in hip"    Past Surgical History  Procedure Laterality Date  . Prosthetic cornea placement, right eye  2007    [facility]  . Lumbar fusion    . Peptic ulcer dz surgery  pt was in his 20s    bleeding ulcer.   . Cholecystectomy  09/15/2012    Procedure: LAPAROSCOPIC CHOLECYSTECTOMY WITH INTRAOPERATIVE CHOLANGIOGRAM;  Surgeon: Pedro Earls, MD;  Location: [facility];  Service: General;  Laterality: N/A;  . Inguinal hernia repair      right  . Eye surgery Bilateral     cataracts  . Neck surgery      Family History  Problem Relation Age of Onset  . Breast cancer Mother   . Colon cancer Mother   . Colon cancer Other     Elevated Risk for  . Hypertension Mother   . Hypertension Father   . Hypertension Father   . Diabetes Mother   . Diabetes Sister   . Diabetes Brother    Social History:  reports that he has never smoked. He  has never used smokeless tobacco. He reports that he does not drink alcohol or use illicit drugs.  Allergies:  Allergies  Allergen Reactions  . Furosemide     pancreatitis  . Diamox [Acetazolamide] Itching  . Lisinopril Rash    No prescriptions prior to admission    No results found for this or any previous visit (from the past 48 hour(s)). No results found.  Review of Systems  Constitutional: Negative.   HENT: Negative.   Respiratory: Negative.   Cardiovascular: Negative.   Gastrointestinal: Negative.   Genitourinary: Negative.   Musculoskeletal: Positive for back pain.  Skin: Negative.   Neurological: Negative.   Psychiatric/Behavioral: Negative.     There were no vitals taken for this visit. Physical Exam  Constitutional: He is oriented to person, place, and time. No distress.  HENT:  Head: Atraumatic.  Eyes: EOM are normal.  Neck: Normal range of motion.  Cardiovascular: Normal rate.   Respiratory: Effort normal. No respiratory distress.  GI: He exhibits no distension.  Musculoskeletal: He exhibits tenderness.  Neurological: He is alert and oriented to person, place, and time.  Skin: Skin is  warm and dry.  Psychiatric: He has a normal mood and affect.     Assessment/Plan Right L3-4 and L5-S1 HNP/stenosis with back pain with lower extremity radiculopathy.  Right thumb trigger finger.  Will proceed with right L3-4 and right L5-S1 decompression as scheduled. Right thumb trigger finger release. Surgical procedure along with possible risks/compications and recovery time discussed.  All questions answered.    OWENS,Travor Royce M 10/25/2015, 4:51 PM

## 2015-10-26 ENCOUNTER — Encounter (HOSPITAL_COMMUNITY)
Admission: RE | Disposition: A | Discharge status: Home / Self Care | Payer: Medicare Other | Source: Ambulatory Visit | Attending: Specialist

## 2015-10-26 ENCOUNTER — Ambulatory Visit (HOSPITAL_COMMUNITY): Payer: Medicare Other | Admitting: Emergency Medicine

## 2015-10-26 ENCOUNTER — Ambulatory Visit (HOSPITAL_COMMUNITY): Payer: Medicare Other

## 2015-10-26 ENCOUNTER — Ambulatory Visit (HOSPITAL_COMMUNITY): Payer: Medicare Other | Admitting: Certified Registered Nurse Anesthetist

## 2015-10-26 ENCOUNTER — Observation Stay (HOSPITAL_COMMUNITY)
Admission: RE | Admit: 2015-10-26 | Discharge: 2015-10-30 | Disposition: A | Discharge status: Home / Self Care | Payer: Medicare Other | Source: Ambulatory Visit | Attending: Specialist | Admitting: Specialist

## 2015-10-26 ENCOUNTER — Encounter (HOSPITAL_COMMUNITY): Payer: Self-pay | Admitting: *Deleted

## 2015-10-26 DIAGNOSIS — M48062 Spinal stenosis, lumbar region with neurogenic claudication: Secondary | ICD-10-CM | POA: Diagnosis present

## 2015-10-26 DIAGNOSIS — D62 Acute posthemorrhagic anemia: Secondary | ICD-10-CM | POA: Insufficient documentation

## 2015-10-26 DIAGNOSIS — Z79899 Other long term (current) drug therapy: Secondary | ICD-10-CM | POA: Insufficient documentation

## 2015-10-26 DIAGNOSIS — I2510 Atherosclerotic heart disease of native coronary artery without angina pectoris: Secondary | ICD-10-CM | POA: Insufficient documentation

## 2015-10-26 DIAGNOSIS — I509 Heart failure, unspecified: Secondary | ICD-10-CM | POA: Insufficient documentation

## 2015-10-26 DIAGNOSIS — E11649 Type 2 diabetes mellitus with hypoglycemia without coma: Secondary | ICD-10-CM | POA: Diagnosis not present

## 2015-10-26 DIAGNOSIS — M5126 Other intervertebral disc displacement, lumbar region: Secondary | ICD-10-CM | POA: Insufficient documentation

## 2015-10-26 DIAGNOSIS — G4733 Obstructive sleep apnea (adult) (pediatric): Secondary | ICD-10-CM | POA: Insufficient documentation

## 2015-10-26 DIAGNOSIS — M4806 Spinal stenosis, lumbar region: Secondary | ICD-10-CM | POA: Diagnosis not present

## 2015-10-26 DIAGNOSIS — Z794 Long term (current) use of insulin: Secondary | ICD-10-CM | POA: Insufficient documentation

## 2015-10-26 DIAGNOSIS — R531 Weakness: Secondary | ICD-10-CM | POA: Insufficient documentation

## 2015-10-26 DIAGNOSIS — Z419 Encounter for procedure for purposes other than remedying health state, unspecified: Secondary | ICD-10-CM

## 2015-10-26 DIAGNOSIS — E162 Hypoglycemia, unspecified: Secondary | ICD-10-CM | POA: Insufficient documentation

## 2015-10-26 DIAGNOSIS — R339 Retention of urine, unspecified: Secondary | ICD-10-CM | POA: Diagnosis not present

## 2015-10-26 DIAGNOSIS — M5127 Other intervertebral disc displacement, lumbosacral region: Secondary | ICD-10-CM | POA: Insufficient documentation

## 2015-10-26 DIAGNOSIS — E785 Hyperlipidemia, unspecified: Secondary | ICD-10-CM | POA: Insufficient documentation

## 2015-10-26 DIAGNOSIS — H548 Legal blindness, as defined in USA: Secondary | ICD-10-CM | POA: Insufficient documentation

## 2015-10-26 DIAGNOSIS — E118 Type 2 diabetes mellitus with unspecified complications: Secondary | ICD-10-CM | POA: Insufficient documentation

## 2015-10-26 DIAGNOSIS — I10 Essential (primary) hypertension: Secondary | ICD-10-CM | POA: Insufficient documentation

## 2015-10-26 DIAGNOSIS — M65311 Trigger thumb, right thumb: Secondary | ICD-10-CM | POA: Diagnosis not present

## 2015-10-26 HISTORY — PX: TRIGGER FINGER RELEASE: SHX641

## 2015-10-26 HISTORY — PX: LUMBAR LAMINECTOMY/DECOMPRESSION MICRODISCECTOMY: SHX5026

## 2015-10-26 LAB — GLUCOSE, CAPILLARY
GLUCOSE-CAPILLARY: 106 mg/dL — AB (ref 65–99)
GLUCOSE-CAPILLARY: 131 mg/dL — AB (ref 65–99)
GLUCOSE-CAPILLARY: 65 mg/dL (ref 65–99)
GLUCOSE-CAPILLARY: 73 mg/dL (ref 65–99)
GLUCOSE-CAPILLARY: 84 mg/dL (ref 65–99)
Glucose-Capillary: 85 mg/dL (ref 65–99)

## 2015-10-26 SURGERY — LUMBAR LAMINECTOMY/DECOMPRESSION MICRODISCECTOMY
Anesthesia: General | Site: Thumb | Laterality: Right

## 2015-10-26 MED ORDER — MENTHOL 3 MG MT LOZG: 1.0000 | LOZENGE | OROMUCOSAL | Status: DC | PRN

## 2015-10-26 MED ORDER — MIDAZOLAM HCL 2 MG/2ML IJ SOLN
INTRAMUSCULAR | Status: AC
  Filled 2015-10-26: qty 4

## 2015-10-26 MED ORDER — BUPIVACAINE HCL (PF) 0.5 % IJ SOLN
INTRAMUSCULAR | Status: AC
  Filled 2015-10-26: qty 30

## 2015-10-26 MED ORDER — INSULIN ASPART 100 UNIT/ML ~~LOC~~ SOLN
0.0000 [IU] | SUBCUTANEOUS | Status: DC
  Administered 2015-10-27: 7 [IU] via SUBCUTANEOUS
  Administered 2015-10-28 (×2): 3 [IU] via SUBCUTANEOUS
  Administered 2015-10-28 (×2): 4 [IU] via SUBCUTANEOUS
  Administered 2015-10-29 – 2015-10-30 (×4): 3 [IU] via SUBCUTANEOUS
  Administered 2015-10-30: 4 [IU] via SUBCUTANEOUS

## 2015-10-26 MED ORDER — LIDOCAINE HCL (CARDIAC) 20 MG/ML IV SOLN
INTRAVENOUS | Status: DC | PRN
  Administered 2015-10-26: 60 mg via INTRAVENOUS

## 2015-10-26 MED ORDER — BISACODYL 5 MG PO TBEC
5.0000 mg | DELAYED_RELEASE_TABLET | Freq: Every day | ORAL | Status: DC | PRN
  Administered 2015-10-30: 5 mg via ORAL
  Filled 2015-10-26: qty 1

## 2015-10-26 MED ORDER — OMEGA-3-ACID ETHYL ESTERS 1 G PO CAPS
2.0000 | ORAL_CAPSULE | Freq: Two times a day (BID) | ORAL | Status: DC
  Administered 2015-10-26 – 2015-10-30 (×8): 2 g via ORAL
  Filled 2015-10-26 (×8): qty 2

## 2015-10-26 MED ORDER — POLYETHYLENE GLYCOL 3350 17 G PO PACK
17.0000 g | PACK | Freq: Every day | ORAL | Status: DC | PRN
  Administered 2015-10-27 – 2015-10-28 (×2): 17 g via ORAL
  Filled 2015-10-26 (×3): qty 1

## 2015-10-26 MED ORDER — SODIUM CHLORIDE 0.9 % IJ SOLN
3.0000 mL | Freq: Two times a day (BID) | INTRAMUSCULAR | Status: DC
  Administered 2015-10-28: 3 mL via INTRAVENOUS

## 2015-10-26 MED ORDER — ACETAMINOPHEN 650 MG RE SUPP: 650.0000 mg | RECTAL | Status: DC | PRN

## 2015-10-26 MED ORDER — HYDROMORPHONE HCL 1 MG/ML IJ SOLN
INTRAMUSCULAR | Status: AC
  Administered 2015-10-26: 0.5 mg via INTRAVENOUS
  Filled 2015-10-26: qty 1

## 2015-10-26 MED ORDER — DEXTROSE 50 % IV SOLN
INTRAVENOUS | Status: AC
  Filled 2015-10-26: qty 50

## 2015-10-26 MED ORDER — MUPIROCIN 2 % EX OINT
1.0000 "application " | TOPICAL_OINTMENT | Freq: Two times a day (BID) | CUTANEOUS | Status: DC
  Administered 2015-10-26: 1 via TOPICAL

## 2015-10-26 MED ORDER — ONDANSETRON HCL 4 MG/2ML IJ SOLN: 4.0000 mg | INTRAMUSCULAR | Status: DC | PRN

## 2015-10-26 MED ORDER — BUPIVACAINE LIPOSOME 1.3 % IJ SUSP
20.0000 mL | INTRAMUSCULAR | Status: AC
  Administered 2015-10-26: 20 mL
  Filled 2015-10-26: qty 20

## 2015-10-26 MED ORDER — BUPIVACAINE HCL 0.5 % IJ SOLN
INTRAMUSCULAR | Status: DC | PRN
  Administered 2015-10-26: 20 mL

## 2015-10-26 MED ORDER — TRAMADOL HCL 50 MG PO TABS
100.0000 mg | ORAL_TABLET | Freq: Four times a day (QID) | ORAL | Status: DC
Start: 2015-10-26 — End: 2015-10-30
  Administered 2015-10-26 – 2015-10-30 (×4): 100 mg via ORAL
  Filled 2015-10-26 (×3): qty 2

## 2015-10-26 MED ORDER — CARVEDILOL 12.5 MG PO TABS
12.5000 mg | ORAL_TABLET | Freq: Two times a day (BID) | ORAL | Status: DC
  Administered 2015-10-27 – 2015-10-30 (×8): 12.5 mg via ORAL
  Filled 2015-10-26 (×8): qty 1

## 2015-10-26 MED ORDER — PHENOL 1.4 % MT LIQD: 1.0000 | OROMUCOSAL | Status: DC | PRN

## 2015-10-26 MED ORDER — LACTATED RINGERS IV SOLN
INTRAVENOUS | Status: DC | PRN
  Administered 2015-10-26 (×3): via INTRAVENOUS

## 2015-10-26 MED ORDER — PROPOFOL 10 MG/ML IV BOLUS
INTRAVENOUS | Status: DC | PRN
  Administered 2015-10-26: 120 mg via INTRAVENOUS

## 2015-10-26 MED ORDER — DEXTROSE 50 % IV SOLN
1.0000 | Freq: Once | INTRAVENOUS | Status: AC
  Administered 2015-10-26: 50 mL via INTRAVENOUS

## 2015-10-26 MED ORDER — CEFAZOLIN SODIUM 1-5 GM-% IV SOLN
1.0000 g | Freq: Three times a day (TID) | INTRAVENOUS | Status: AC
  Administered 2015-10-26 – 2015-10-27 (×2): 1 g via INTRAVENOUS
  Filled 2015-10-26 (×2): qty 50

## 2015-10-26 MED ORDER — OXYCODONE-ACETAMINOPHEN 5-325 MG PO TABS
1.0000 | ORAL_TABLET | ORAL | Status: DC | PRN
  Administered 2015-10-27: 2 via ORAL
  Administered 2015-10-27: 1 via ORAL
  Administered 2015-10-28 – 2015-10-29 (×3): 2 via ORAL
  Administered 2015-10-30: 1 via ORAL
  Filled 2015-10-26 (×4): qty 2
  Filled 2015-10-26 (×2): qty 1

## 2015-10-26 MED ORDER — CEFAZOLIN SODIUM-DEXTROSE 2-3 GM-% IV SOLR
INTRAVENOUS | Status: AC
  Filled 2015-10-26: qty 50

## 2015-10-26 MED ORDER — FLEET ENEMA 7-19 GM/118ML RE ENEM: 1.0000 | ENEMA | Freq: Once | RECTAL | Status: DC | PRN

## 2015-10-26 MED ORDER — BUPIVACAINE HCL 0.5 % IJ SOLN
INTRAMUSCULAR | Status: DC | PRN
  Administered 2015-10-26: 8 mL

## 2015-10-26 MED ORDER — VECURONIUM BROMIDE 10 MG IV SOLR
INTRAVENOUS | Status: DC | PRN
  Administered 2015-10-26 (×3): 2 mg via INTRAVENOUS

## 2015-10-26 MED ORDER — LOSARTAN POTASSIUM 50 MG PO TABS
50.0000 mg | ORAL_TABLET | Freq: Every day | ORAL | Status: DC
  Administered 2015-10-26 – 2015-10-30 (×5): 50 mg via ORAL
  Filled 2015-10-26 (×4): qty 1

## 2015-10-26 MED ORDER — BUPIVACAINE HCL (PF) 0.5 % IJ SOLN
INTRAMUSCULAR | Status: AC
  Filled 2015-10-26: qty 20

## 2015-10-26 MED ORDER — SODIUM CHLORIDE 0.9 % IV SOLN: 250.0000 mL | INTRAVENOUS | Status: DC

## 2015-10-26 MED ORDER — VECURONIUM BROMIDE 10 MG IV SOLR
INTRAVENOUS | Status: AC
  Filled 2015-10-26: qty 10

## 2015-10-26 MED ORDER — MIDAZOLAM HCL 5 MG/5ML IJ SOLN
INTRAMUSCULAR | Status: DC | PRN
  Administered 2015-10-26: 2 mg via INTRAVENOUS

## 2015-10-26 MED ORDER — MUPIROCIN 2 % EX OINT
TOPICAL_OINTMENT | CUTANEOUS | Status: AC
  Administered 2015-10-26: 1 via TOPICAL
  Filled 2015-10-26: qty 22

## 2015-10-26 MED ORDER — GABAPENTIN 300 MG PO CAPS
300.0000 mg | ORAL_CAPSULE | Freq: Three times a day (TID) | ORAL | Status: DC
  Administered 2015-10-26 – 2015-10-30 (×12): 300 mg via ORAL
  Filled 2015-10-26 (×2): qty 1
  Filled 2015-10-26: qty 3
  Filled 2015-10-26 (×9): qty 1

## 2015-10-26 MED ORDER — ACETAMINOPHEN 325 MG PO TABS
650.0000 mg | ORAL_TABLET | ORAL | Status: DC | PRN
  Administered 2015-10-28: 650 mg via ORAL
  Filled 2015-10-26: qty 2

## 2015-10-26 MED ORDER — ARTIFICIAL TEARS OP OINT
TOPICAL_OINTMENT | OPHTHALMIC | Status: DC | PRN
  Administered 2015-10-26: 1 via OPHTHALMIC

## 2015-10-26 MED ORDER — STERILE WATER FOR INJECTION IJ SOLN
INTRAMUSCULAR | Status: AC
  Filled 2015-10-26: qty 10

## 2015-10-26 MED ORDER — DOCUSATE SODIUM 100 MG PO CAPS
100.0000 mg | ORAL_CAPSULE | Freq: Two times a day (BID) | ORAL | Status: DC
  Administered 2015-10-26 – 2015-10-30 (×8): 100 mg via ORAL
  Filled 2015-10-26 (×8): qty 1

## 2015-10-26 MED ORDER — GLYCOPYRROLATE 0.2 MG/ML IJ SOLN
INTRAMUSCULAR | Status: DC | PRN
  Administered 2015-10-26: 0.6 mg via INTRAVENOUS

## 2015-10-26 MED ORDER — SODIUM CHLORIDE 0.9 % IV SOLN
INTRAVENOUS | Status: DC
  Administered 2015-10-26: 21:00:00 via INTRAVENOUS

## 2015-10-26 MED ORDER — GELATIN ABSORBABLE 100 EX MISC
CUTANEOUS | Status: DC | PRN
  Administered 2015-10-26: 20 mL via TOPICAL

## 2015-10-26 MED ORDER — FENTANYL CITRATE (PF) 250 MCG/5ML IJ SOLN
INTRAMUSCULAR | Status: AC
  Filled 2015-10-26: qty 5

## 2015-10-26 MED ORDER — PANTOPRAZOLE SODIUM 40 MG IV SOLR
40.0000 mg | Freq: Every day | INTRAVENOUS | Status: DC
Start: 2015-10-26 — End: 2015-10-27
  Administered 2015-10-26: 40 mg via INTRAVENOUS
  Filled 2015-10-26: qty 40

## 2015-10-26 MED ORDER — ALBUMIN HUMAN 5 % IV SOLN
INTRAVENOUS | Status: DC | PRN
  Administered 2015-10-26: 14:00:00 via INTRAVENOUS

## 2015-10-26 MED ORDER — ROCURONIUM BROMIDE 100 MG/10ML IV SOLN
INTRAVENOUS | Status: DC | PRN
  Administered 2015-10-26: 50 mg via INTRAVENOUS

## 2015-10-26 MED ORDER — ISOSORBIDE MONONITRATE ER 60 MG PO TB24
120.0000 mg | ORAL_TABLET | Freq: Every day | ORAL | Status: DC
  Administered 2015-10-27 – 2015-10-30 (×4): 120 mg via ORAL
  Filled 2015-10-26 (×5): qty 2

## 2015-10-26 MED ORDER — EPHEDRINE SULFATE 50 MG/ML IJ SOLN
INTRAMUSCULAR | Status: DC | PRN
  Administered 2015-10-26 (×2): 5 mg via INTRAVENOUS

## 2015-10-26 MED ORDER — ALUM & MAG HYDROXIDE-SIMETH 200-200-20 MG/5ML PO SUSP
30.0000 mL | Freq: Four times a day (QID) | ORAL | Status: DC | PRN
  Administered 2015-10-28 – 2015-10-29 (×2): 30 mL via ORAL
  Filled 2015-10-26 (×2): qty 30

## 2015-10-26 MED ORDER — THROMBIN 20000 UNITS EX SOLR
CUTANEOUS | Status: AC
  Filled 2015-10-26: qty 20000

## 2015-10-26 MED ORDER — FENTANYL CITRATE (PF) 100 MCG/2ML IJ SOLN
INTRAMUSCULAR | Status: DC | PRN
  Administered 2015-10-26 (×2): 50 ug via INTRAVENOUS
  Administered 2015-10-26: 100 ug via INTRAVENOUS
  Administered 2015-10-26 (×3): 50 ug via INTRAVENOUS
  Administered 2015-10-26: 150 ug via INTRAVENOUS

## 2015-10-26 MED ORDER — HYDROMORPHONE HCL 1 MG/ML IJ SOLN
0.2500 mg | INTRAMUSCULAR | Status: DC | PRN
  Administered 2015-10-26 (×2): 0.5 mg via INTRAVENOUS

## 2015-10-26 MED ORDER — PROPOFOL 10 MG/ML IV BOLUS
INTRAVENOUS | Status: AC
  Filled 2015-10-26: qty 20

## 2015-10-26 MED ORDER — LACTATED RINGERS IV SOLN
INTRAVENOUS | Status: DC
  Administered 2015-10-26: 12:00:00 via INTRAVENOUS

## 2015-10-26 MED ORDER — PHENYLEPHRINE HCL 10 MG/ML IJ SOLN
10.0000 mg | INTRAMUSCULAR | Status: DC | PRN
  Administered 2015-10-26: 20 ug/min via INTRAVENOUS

## 2015-10-26 MED ORDER — MORPHINE SULFATE (PF) 2 MG/ML IV SOLN
1.0000 mg | INTRAVENOUS | Status: DC | PRN
  Administered 2015-10-27 (×5): 4 mg via INTRAVENOUS
  Filled 2015-10-26 (×5): qty 2

## 2015-10-26 MED ORDER — VITAMIN B-12 1000 MCG PO TABS
2000.0000 ug | ORAL_TABLET | Freq: Every day | ORAL | Status: DC
  Administered 2015-10-27 – 2015-10-30 (×4): 2000 ug via ORAL
  Filled 2015-10-26 (×4): qty 2

## 2015-10-26 MED ORDER — ONDANSETRON HCL 4 MG/2ML IJ SOLN
INTRAMUSCULAR | Status: DC | PRN
  Administered 2015-10-26: 4 mg via INTRAVENOUS

## 2015-10-26 MED ORDER — HYDROCODONE-ACETAMINOPHEN 5-325 MG PO TABS
1.0000 | ORAL_TABLET | ORAL | Status: DC | PRN
  Administered 2015-10-27: 2 via ORAL
  Filled 2015-10-26: qty 2

## 2015-10-26 MED ORDER — PHENYLEPHRINE HCL 10 MG/ML IJ SOLN
INTRAMUSCULAR | Status: DC | PRN
  Administered 2015-10-26 (×2): 80 ug via INTRAVENOUS

## 2015-10-26 MED ORDER — NEOSTIGMINE METHYLSULFATE 10 MG/10ML IV SOLN
INTRAVENOUS | Status: DC | PRN
  Administered 2015-10-26: 5 mg via INTRAVENOUS

## 2015-10-26 MED ORDER — SODIUM CHLORIDE 0.9 % IJ SOLN: 3.0000 mL | INTRAMUSCULAR | Status: DC | PRN

## 2015-10-26 SURGICAL SUPPLY — 67 items
BLADE SURG 15 STRL LF DISP TIS (BLADE) ×2 IMPLANT
BLADE SURG 15 STRL SS (BLADE) ×1
BNDG COHESIVE 1X5 TAN STRL LF (GAUZE/BANDAGES/DRESSINGS) ×3 IMPLANT
BNDG CONFORM 2 STRL LF (GAUZE/BANDAGES/DRESSINGS) ×3 IMPLANT
BNDG ESMARK 4X9 LF (GAUZE/BANDAGES/DRESSINGS) ×3 IMPLANT
BUR RND FLUTED 2.5 (BURR) IMPLANT
BUR SABER RD CUTTING 3.0 (BURR) IMPLANT
CANISTER SUCTION 2500CC (MISCELLANEOUS) ×3 IMPLANT
COVER SURGICAL LIGHT HANDLE (MISCELLANEOUS) ×3 IMPLANT
CUFF TOURNIQUET SINGLE 24IN (TOURNIQUET CUFF) ×3 IMPLANT
CUFF TOURNIQUET SINGLE 34IN LL (TOURNIQUET CUFF) IMPLANT
DERMABOND ADVANCED (GAUZE/BANDAGES/DRESSINGS) ×2
DERMABOND ADVANCED .7 DNX12 (GAUZE/BANDAGES/DRESSINGS) ×4 IMPLANT
DRAPE EXTREMITY T 121X128X90 (DRAPE) ×3 IMPLANT
DRAPE INCISE IOBAN 66X45 STRL (DRAPES) IMPLANT
DRAPE MICROSCOPE LEICA (MISCELLANEOUS) ×3 IMPLANT
DRAPE PROXIMA HALF (DRAPES) IMPLANT
DRAPE SURG 17X23 STRL (DRAPES) ×12 IMPLANT
DRAPE U-SHAPE 47X51 STRL (DRAPES) ×3 IMPLANT
DRSG MEPILEX BORDER 4X4 (GAUZE/BANDAGES/DRESSINGS) IMPLANT
DRSG MEPILEX BORDER 4X8 (GAUZE/BANDAGES/DRESSINGS) IMPLANT
DRSG PAD ABDOMINAL 8X10 ST (GAUZE/BANDAGES/DRESSINGS) ×6 IMPLANT
DURAPREP 26ML APPLICATOR (WOUND CARE) ×6 IMPLANT
ELECT REM PT RETURN 9FT ADLT (ELECTROSURGICAL) ×3
ELECTRODE REM PT RTRN 9FT ADLT (ELECTROSURGICAL) ×2 IMPLANT
EVACUATOR 1/8 PVC DRAIN (DRAIN) IMPLANT
GAUZE SPONGE 2X2 8PLY STRL LF (GAUZE/BANDAGES/DRESSINGS) ×2 IMPLANT
GAUZE SPONGE 4X4 12PLY STRL (GAUZE/BANDAGES/DRESSINGS) ×3 IMPLANT
GAUZE SPONGE 4X4 16PLY XRAY LF (GAUZE/BANDAGES/DRESSINGS) ×3 IMPLANT
GLOVE BIO SURGEON STRL SZ 6.5 (GLOVE) ×3 IMPLANT
GLOVE BIOGEL PI IND STRL 8 (GLOVE) ×2 IMPLANT
GLOVE BIOGEL PI INDICATOR 8 (GLOVE) ×1
GLOVE ECLIPSE 9.0 STRL (GLOVE) ×3 IMPLANT
GLOVE ORTHO TXT STRL SZ7.5 (GLOVE) ×3 IMPLANT
GLOVE SURG 8.5 LATEX PF (GLOVE) ×3 IMPLANT
GOWN STRL REUS W/ TWL LRG LVL3 (GOWN DISPOSABLE) ×2 IMPLANT
GOWN STRL REUS W/TWL 2XL LVL3 (GOWN DISPOSABLE) ×6 IMPLANT
GOWN STRL REUS W/TWL LRG LVL3 (GOWN DISPOSABLE) ×1
KIT BASIN OR (CUSTOM PROCEDURE TRAY) ×3 IMPLANT
KIT ROOM TURNOVER OR (KITS) ×3 IMPLANT
NEEDLE HYPO 25GX1X1/2 BEV (NEEDLE) ×3 IMPLANT
NEEDLE SPNL 18GX3.5 QUINCKE PK (NEEDLE) ×6 IMPLANT
NS IRRIG 1000ML POUR BTL (IV SOLUTION) ×3 IMPLANT
PACK LAMINECTOMY ORTHO (CUSTOM PROCEDURE TRAY) ×3 IMPLANT
PAD ARMBOARD 7.5X6 YLW CONV (MISCELLANEOUS) ×6 IMPLANT
PATTIES SURGICAL .5 X.5 (GAUZE/BANDAGES/DRESSINGS) IMPLANT
PATTIES SURGICAL .75X.75 (GAUZE/BANDAGES/DRESSINGS) IMPLANT
PATTIES SURGICAL 1X1 (DISPOSABLE) IMPLANT
SPONGE GAUZE 2X2 STER 10/PKG (GAUZE/BANDAGES/DRESSINGS) ×1
SPONGE LAP 4X18 X RAY DECT (DISPOSABLE) IMPLANT
SPONGE SURGIFOAM ABS GEL 100 (HEMOSTASIS) IMPLANT
SUT ETHILON 4 0 PS 2 18 (SUTURE) ×6 IMPLANT
SUT VIC AB 0 CT1 27 (SUTURE)
SUT VIC AB 0 CT1 27XBRD ANBCTR (SUTURE) IMPLANT
SUT VIC AB 1 CT1 27 (SUTURE)
SUT VIC AB 1 CT1 27XBRD ANBCTR (SUTURE) IMPLANT
SUT VIC AB 2-0 CT1 27 (SUTURE)
SUT VIC AB 2-0 CT1 TAPERPNT 27 (SUTURE) IMPLANT
SUT VIC AB 3-0 X1 27 (SUTURE) ×3 IMPLANT
SUT VICRYL 0 UR6 27IN ABS (SUTURE) ×6 IMPLANT
SUT VICRYL 4-0 PS2 18IN ABS (SUTURE) ×3 IMPLANT
SYR CONTROL 10ML LL (SYRINGE) ×3 IMPLANT
TAPE CLOTH SURG 6X10 WHT LF (GAUZE/BANDAGES/DRESSINGS) ×3 IMPLANT
TOWEL OR 17X24 6PK STRL BLUE (TOWEL DISPOSABLE) ×3 IMPLANT
TOWEL OR 17X26 10 PK STRL BLUE (TOWEL DISPOSABLE) ×3 IMPLANT
TRAY FOLEY CATH 16FRSI W/METER (SET/KITS/TRAYS/PACK) IMPLANT
WATER STERILE IRR 1000ML POUR (IV SOLUTION) ×3 IMPLANT

## 2015-10-26 NOTE — Anesthesia Preprocedure Evaluation (Addendum)
Anesthesia Evaluation  Patient identified by MRN, date of birth, ID band Patient awake    Reviewed: Allergy & Precautions, H&P , NPO status , Patient's Chart, lab work & pertinent test results, reviewed documented beta blocker date and time   Airway Mallampati: II  TM Distance: >3 FB Neck ROM: Full    Dental no notable dental hx. (+) Teeth Intact, Dental Advisory Given   Pulmonary sleep apnea ,    Pulmonary exam normal breath sounds clear to auscultation       Cardiovascular hypertension, Pt. on medications and Pt. on home beta blockers + CAD, + Peripheral Vascular Disease and +CHF   Rhythm:Regular Rate:Normal     Neuro/Psych negative neurological ROS  negative psych ROS   GI/Hepatic Neg liver ROS, GERD  Medicated and Controlled,  Endo/Other  diabetes, Type 1, Insulin Dependent  Renal/GU negative Renal ROS  negative genitourinary   Musculoskeletal  (+) Arthritis , Osteoarthritis,    Abdominal   Peds  Hematology negative hematology ROS (+)   Anesthesia Other Findings   Reproductive/Obstetrics negative OB ROS                            Anesthesia Physical Anesthesia Plan  ASA: III  Anesthesia Plan: General   Post-op Pain Management:    Induction: Intravenous  Airway Management Planned: Oral ETT  Additional Equipment:   Intra-op Plan:   Post-operative Plan: Extubation in OR  Informed Consent: I have reviewed the patients History and Physical, chart, labs and discussed the procedure including the risks, benefits and alternatives for the proposed anesthesia with the patient or authorized representative who has indicated his/her understanding and acceptance.   Dental advisory given  Plan Discussed with: CRNA  Anesthesia Plan Comments:         Anesthesia Quick Evaluation

## 2015-10-26 NOTE — Discharge Instructions (Addendum)
° ° °  Keep dressing dry. Elevated wrist above heart. No ice to the area of the surgery. After three day remove the dressing and apply a bandaid. May wet after 3 days with bandaid in place.  Return to office in ten days for removal of sutures right thumb. Call if any drainage, redness or worsening swelling.    No lifting greater than 10 lbs. Avoid bending, stooping and twisting. Walk in house for first week them may start to get out slowly increasing distances by 3 weeks post op. Keep incision dry for 3 days, may use tegaderm or similar water impervious dressing. Call [facility] to schedule an appointment for voiding trial on Thursday, Dr. Junious Silk was the doctor on call That recommended this routine for urinary retention. May shower tomorrow and change dressing following shower. Schedule an appointment with [name] for follow up of your insulin doses and log of your blood sugars in 1 1/2 - 2 weeks. Call my office to schedule an appointment in 2 weeks.

## 2015-10-26 NOTE — Op Note (Signed)
10/26/2015  5:41 PM  PATIENT:  [name]  64 y.o. male  MRN: [id number]  OPERATIVE REPORT  PRE-OPERATIVE DIAGNOSIS:  right and central stenosis L3-4, right lateral recess stenosis L5-S1  POST-OPERATIVE DIAGNOSIS:  right and central stenosis L3-4, right lateral recess stenosis L5-S1  PROCEDURE:  Procedure(s): RIGHT AND CENTRAL LUMBAR LAMINECTOMY L3-4, RIGHT L5-S1 LATERAL RECESS DECOMPRESSION RELEASE TRIGGER FINGER RIGHT THUMB    SURGEON:  Jessy Oto, MD     ASSISTANT:  Benjiman Core, PA-C  (Present throughout the entire procedure and necessary for completion of procedure in a timely manner)     ANESTHESIA:  General,supplemented with local anesthesia marcaine 1/2% 5 cc right thumb volar MCP, marcaine 0.5% 1:1 exparel 1.3% total 20cc lumbar spine, Dr. Therisa Doyne.    COMPLICATIONS:  None.  TOTAL TOURNIQUET TIME: 9 min _0 .  EBL: 300cc  DRAINS: Foley to SD.      PROCEDURE:The patient was met in the holding area, and the appropriate right lumbar level L3-4 and L5-S1 identified and marked with "x" and my initials and the appropriate right thumb identified and marked with an "X" and my initials. The patient was then transported to OR and was placed under general anesthesia without difficulty. The patient received appropriate preoperative antibiotic prophylaxis. Foley catheter was placed sterilely. The patient after intubation atraumatically was transferred to the operating room table, prone position, Wilson frame, sliding OR table. All pressure points were well padded. The arms in 90-90 well-padded at the elbows. Standard prep with DuraPrep solution lower dorsal spine to the mid sacral segment. Draped in the usual manner iodine Vi-Drape was used. Time-out procedure was called and correct. 2x 18-gauge spinal needle was then inserted at the expected L4 level. C-arm was draped sterilely to the field and used to identify the spinal needles positions. The needle was at the lower  aspect of the lamina of S1 and the upper needle at the lower aspect of the L3 pedicle.  Skin superior to the lower needle and inferior to the upper needle was then infiltrated with Marcaine half percent with 1.3% exparel 1:1 total of 20 cc used. An incision approximately an inch inch and a half in length was then made through skin and subcutaneous layers in line with thethe expected midline just superior to the inferior spinal needle entry point and similarly inferior to the upper needle. An incision made into the right lumbosacral fascia approximately an inch in length at the expected L5-S1 level and the L3-4 level.  Cobb was then introduced into the incision site and used to carefully form subperiosteal movement of the right paralumbar muscles off of the posterior lamina of the expected L5-S1 level and right L3-4 level. The Boss retractor docked on the posterior aspect of the lamina at the expected L5-S1 level. Cross table lateral radiograph identified a Penfield#4 at the L5-S1level. The operating room microscope sterilely draped brought into the field. Under the operating room microscope, the L5-S1 interspace carefully debrided the small amount of muscle attachment here and high-speed bur used to drill the medial aspect of the inferior articular process of L5 approximately 40%.  2 mm Kerrison then used to enter the spinal canal over the superior aspect of the L5 lamina carefully using the Kerrison to debris the attachment as a curet. Foraminotomy was then performed over the L5 nerve root. The medial 10% superior articular process of L5 and then resected using an osteotome and 2 mm Kerrison. This allowed for identification of the thecal sac.  Penfield 4 was then used to carefully mobilize the thecal sac medially and the S1 nerve root identified within the lateral recess flattened over the posterior aspect of the herniated disc. Carefully the lateral aspect of the S1 nerve root was identified and a Penfield 4 was  used to mobilize the nerve medially such that the herniated disc was visible on the right side.  Using a Penfield 4 for retraction and a 15 blade scalpel was used to incise the posterior longitudinal ligament within the lateral recess on the right side longitudinally. Disc material extruded and this was removed using micropituitary rongeurs and nerve hook nerve root and then more easily able to be mobilized medially and retracted using a love retractor. Further foraminotomies was performed over the L5 nerve root the nerve root was noted to be decompressed. The nerve root able to be retracted along the medial aspect of the L5 pedicle and disc material found to be subligamentous extending out the L5 neuroforamen at this level was further resected current pituitary rongeurs.  Ligamentum flavum was further debrided superiorly to the level L5-S1 disc. Had a moderate amount of further resection of the L5 lamina inferiorly was performed. With this then the disc space at L5-S1 was easily visualized and entry into the disc at the sided disc herniation was possible using a Penfield 4 intraoperative Lateral radiograph was used to identify the L5-S1 disc with the Penfield 4 In place just below the disc space. Micropituitary was used to further debride this material superficially from the posterior aspect of the intervertebral disc is posterior lateral aspect of the disc. Small amount of further disc material was found subligamentous extending laterallly from the disc this was removed using micropituitary rongeurs. Ligamentum flavum was debrided and lateral recess along the medial aspect L5-S1 facet no further decompression was necessary. Ball tip nerve probe was then able to carefully palpate the neuroforamen for L5 and S1 finding these to be well decompressed. Attention then turned to the right L3-4 level which was easily visualized with the microscope. Soft tissues debrided about the posterior aspect of the right L3-4  interspace. High-speed bur and then used to carefully drill inferior 4 or 5 mm of the right side inferior L3 lamina and on the medial aspect of the right L3 inferior articular process of 3 mm. The superior margin of the L4 lamina then carefully debrided with curette and a 2 mm kerrison then used to enter the spinal canal over the superior aspect of the L4 lamina resecting bone over the superior aspect and freeing up the attachment of ligamentum flavum here. Ligamentum flavum then debrided with the 2 mm and 3 mm Kerrisons we decompressed the L4 nerve root and the lateral recess right L3-4 decompressed using 2 and 3 mm Kerrisons sizing hypertrophic reflected ligamentum flavum extending superiorly. From was resected off the ventral aspect of the inferior margin of the L3 lamina. Hockey-stick nerve probe could then be passed out the L3 neuroforamen and the L4 neuroforamen. Venous bleeding encountered. Thrombin-soaked Gelfoam used to control this following this then the sac and the L4 nerve root were mobilized medially and the L3-4 disc examined and found not to be herniated. Irrigation was carried out down to this bleeding controlled with Gelfoam. Gelfoam was then removed. Irrigation carried careful examination demonstrated no active bleeding present. Retractors were then carefully removed Since carefully then the Bleeding was then controlled using thrombin-soaked Gelfoam small cottonoids. Small amount of bleeding within the soft tissue mass the laminotomy area  was controlled using bipolar electrocautery. Irrigation was carried out using copious amounts of irrigant solution. All Gelfoam were then removed. No significant active bleeding present at the time of removal. All instruments sponge counts were correct traction system was then carefully removed carefully with bipolar electrocautery of any small bleeders. Lumbodorsal fascia was then carefully approximated with interrupted 0 Vicryl sutures, UR 6 needle deep  subcutaneous layers were approximated with interrupted 0 Vicryl sutures on UR 6 the appear subcutaneous layers approximated with interrupted 2-0 Vicryl sutures and the skin closed with a running subcutaneous stitch of 4-0 Vicryl. Dermabond was applied allowed to dry and then Mepilex bandage applied. Patient was then carefully returned to supine position on a separate OR table.  Tourniquet was applied to the operative right forearm. The right hand wrist and distal forearm was then prepped using sterile conditions and draped using sterile technique. The right forearm and hand was elevated and exsanguinated with Esmarch a right forearm tourniquet elevated ot 250 mmHg. Time-out procedure was called and correct.  The right hand wrist and distal forearm was then prepped using sterile conditions and draped using sterile technique. Loope maginification used. Time-out procedure was called and correct.The skin overlying the distal palmar crease at the level of the thumb A-1 pulley (MCP joint) was infiltrated with 5 cc of marcaine 1/4% plain. A transverse incision was then made over the right long MCP joint at the level of the distal palmar crease. Incision through skin and dermis only and subcutaneous layers spread in the midline with a hemostat down to the flexor tendon sheath overlying the thumb MCP joint. A1 pulley identified. Patient had a very thick band representing the proximal margin of the A-1 pulley double ended retractors were used on both sides retracting the digital nerves. A Stevens scissors then used to incise the flexor tendon sheath and the A1 pulley overlying the thumb MCP joint longitudinally incising through the thickened portion of the A1 pulley and dividing it until it was freed distally as well as proximally. Following inspection of the flexor tendons and determining that release was completed. The tourniquet was released.The tendons and shows some symmetric swelling with no significant flexor tendon  swelling that would represent tendinous injury or previous old injury. Following irrigation and then the incision was closed with 2 interrupted 4-0 nylon sutures in horizontal mattress fashion and Dermabond applied. Dressing of Xeroform 4 x 4's fixed to the skin with CobanThe patient was then returned to the OR stretcher and returned to recovery room in satisfactory condition all instrument and sponge counts were correct     Benjiman Core, PA-C perform the duties of assistant surgeon during this case. he was present from the beginning of the case to the end of the case assisting in transfer the patient from his stretcher to the OR table and back to the stretcher at the end of the case. Assisted in careful retraction and suction of the laminectomy site delicate neural structures operating under the operating room microscope. He performed closure of the incision from the fascia to the skin applying the dressing.     Rosa Wyly E  10/26/2015, 5:41 PM

## 2015-10-26 NOTE — Progress Notes (Signed)
Two level lumbar laminectomies L3-4 and L5-S1.  Right thumb trigger release. Did well had discectomy right L5-S1 for foramenal HNP. I will see him in the AM and assess his progress with foley d/c. Also may ask IM to see for problems of hypoglycemia.

## 2015-10-26 NOTE — Anesthesia Procedure Notes (Signed)
Procedure Name: Intubation Date/Time: 10/26/2015 1:29 PM Performed by: Jacquiline Doe A Pre-anesthesia Checklist: Patient identified, Emergency Drugs available, Suction available, Patient being monitored and Timeout performed Patient Re-evaluated:Patient Re-evaluated prior to inductionOxygen Delivery Method: Circle system utilized Preoxygenation: Pre-oxygenation with 100% oxygen Intubation Type: IV induction and Cricoid Pressure applied Ventilation: Mask ventilation with difficulty, Oral airway inserted - appropriate to patient size and Two handed mask ventilation required Laryngoscope Size: Mac and 4 Grade View: Grade I Tube type: Oral Tube size: 7.5 mm Number of attempts: 1 Airway Equipment and Method: Stylet Placement Confirmation: ETT inserted through vocal cords under direct vision,  positive ETCO2 and breath sounds checked- equal and bilateral Secured at: 22 cm Tube secured with: Tape Dental Injury: Teeth and Oropharynx as per pre-operative assessment

## 2015-10-26 NOTE — Anesthesia Postprocedure Evaluation (Signed)
Anesthesia Post Note  Patient: [name]  Procedure(s) Performed: Procedure(s) (LRB): RIGHT AND CENTRAL LUMBAR LAMINECTOMY L3-4, RIGHT L5-S1 LATERAL RECESS DECOMPRESSION (N/A) RELEASE TRIGGER FINGER RIGHT THUMB (Right)  Anesthesia type: general  Patient location: PACU  Post pain: Pain level controlled  Post assessment: Patient's Cardiovascular Status Stable  Last Vitals:  Filed Vitals:   10/26/15 2042  BP: 144/70  Pulse: 72  Temp: 36.4 C  Resp: 16    Post vital signs: Reviewed and stable  Level of consciousness: sedated  Complications: No apparent anesthesia complications

## 2015-10-26 NOTE — Brief Op Note (Signed)
10/26/2015  5:38 PM  PATIENT:  [name]  64 y.o. male  PRE-OPERATIVE DIAGNOSIS:  right and central stenosis L3-4, right lateral recess stenosis L5-S1  POST-OPERATIVE DIAGNOSIS:  right and central stenosis L3-4, right lateral recess stenosis L5-S1  PROCEDURE:  Procedure(s): RIGHT AND CENTRAL LUMBAR LAMINECTOMY L3-4, RIGHT L5-S1 LATERAL RECESS DECOMPRESSION (N/A) RELEASE TRIGGER FINGER RIGHT THUMB (Right)  SURGEON:  Surgeon(s) and Role:    * Jessy Oto, MD - Primary  PHYSICIAN ASSISTANT:James Ricard Dillon, PA-C   ANESTHESIA:   local and general, Dr. Therisa Doyne.  EBL:  Total I/O In: 5537 [I.V.:3000; IV Piggyback:250] Out: 800 [Urine:400; Blood:400]  BLOOD ADMINISTERED:none  DRAINS: Urinary Catheter (Foley)   LOCAL MEDICATIONS USED:  MARCAINE0.5% 1:1 EXPAREL 1.3% Amount: 20 ml  SPECIMEN:  No Specimen  DISPOSITION OF SPECIMEN:  N/A  COUNTS:  YES  TOURNIQUET:   Total Tourniquet Time Documented: Upper Arm (Right) - 9 minutes Total: Upper Arm (Right) - 9 minutes   DICTATION: .Viviann Spare Dictation  PLAN OF CARE: Admit to inpatient   PATIENT DISPOSITION:  PACU - hemodynamically stable.   Delay start of Pharmacological VTE agent (>24hrs) due to surgical blood loss or risk of bleeding: yes

## 2015-10-26 NOTE — Transfer of Care (Signed)
Immediate Anesthesia Transfer of Care Note  Patient: [name]  Procedure(s) Performed: Procedure(s): RIGHT AND CENTRAL LUMBAR LAMINECTOMY L3-4, RIGHT L5-S1 LATERAL RECESS DECOMPRESSION (N/A) RELEASE TRIGGER FINGER RIGHT THUMB (Right)  Patient Location: PACU  Anesthesia Type:General  Level of Consciousness: awake, alert , oriented, sedated, patient cooperative and responds to stimulation  Airway & Oxygen Therapy: Patient Spontanous Breathing and Patient connected to nasal cannula oxygen  Post-op Assessment: Report given to RN, Post -op Vital signs reviewed and stable, Patient moving all extremities and Patient moving all extremities X 4  Post vital signs: Reviewed and stable  Last Vitals:  Filed Vitals:   10/26/15 1753  BP:   Pulse:   Temp: 36.6 C  Resp:     Complications: No apparent anesthesia complications

## 2015-10-27 DIAGNOSIS — Z419 Encounter for procedure for purposes other than remedying health state, unspecified: Secondary | ICD-10-CM | POA: Insufficient documentation

## 2015-10-27 DIAGNOSIS — E162 Hypoglycemia, unspecified: Secondary | ICD-10-CM | POA: Insufficient documentation

## 2015-10-27 DIAGNOSIS — D62 Acute posthemorrhagic anemia: Secondary | ICD-10-CM

## 2015-10-27 DIAGNOSIS — E118 Type 2 diabetes mellitus with unspecified complications: Secondary | ICD-10-CM | POA: Insufficient documentation

## 2015-10-27 DIAGNOSIS — M4806 Spinal stenosis, lumbar region: Secondary | ICD-10-CM | POA: Diagnosis not present

## 2015-10-27 DIAGNOSIS — M65311 Trigger thumb, right thumb: Secondary | ICD-10-CM

## 2015-10-27 LAB — CBC
HEMATOCRIT: 30.3 % — AB (ref 39.0–52.0)
HEMOGLOBIN: 9.9 g/dL — AB (ref 13.0–17.0)
MCH: 23 pg — ABNORMAL LOW (ref 26.0–34.0)
MCHC: 32.7 g/dL (ref 30.0–36.0)
MCV: 70.5 fL — AB (ref 78.0–100.0)
Platelets: 164 10*3/uL (ref 150–400)
RBC: 4.3 MIL/uL (ref 4.22–5.81)
RDW: 14.9 % (ref 11.5–15.5)
WBC: 6.4 10*3/uL (ref 4.0–10.5)

## 2015-10-27 LAB — URINALYSIS, ROUTINE W REFLEX MICROSCOPIC
Bilirubin Urine: NEGATIVE
GLUCOSE, UA: NEGATIVE mg/dL
Ketones, ur: 15 mg/dL — AB
LEUKOCYTES UA: NEGATIVE
Nitrite: NEGATIVE
PH: 5.5 (ref 5.0–8.0)
PROTEIN: NEGATIVE mg/dL
Specific Gravity, Urine: 1.011 (ref 1.005–1.030)
Urobilinogen, UA: 0.2 mg/dL (ref 0.0–1.0)

## 2015-10-27 LAB — CBC WITH DIFFERENTIAL/PLATELET
BASOS ABS: 0 10*3/uL (ref 0.0–0.1)
BASOS PCT: 0 %
EOS ABS: 0.2 10*3/uL (ref 0.0–0.7)
EOS PCT: 3 %
HCT: 41.1 % (ref 39.0–52.0)
Hemoglobin: 13.5 g/dL (ref 13.0–17.0)
Lymphocytes Relative: 16 %
Lymphs Abs: 1.2 10*3/uL (ref 0.7–4.0)
MCH: 22.5 pg — ABNORMAL LOW (ref 26.0–34.0)
MCHC: 32.8 g/dL (ref 30.0–36.0)
MCV: 68.5 fL — AB (ref 78.0–100.0)
MONO ABS: 0.7 10*3/uL (ref 0.1–1.0)
Monocytes Relative: 9 %
Neutro Abs: 5.1 10*3/uL (ref 1.7–7.7)
Neutrophils Relative %: 71 %
PLATELETS: 156 10*3/uL (ref 150–400)
RBC: 6 MIL/uL — ABNORMAL HIGH (ref 4.22–5.81)
RDW: 15.1 % (ref 11.5–15.5)
WBC: 7.2 10*3/uL (ref 4.0–10.5)

## 2015-10-27 LAB — POCT I-STAT 4, (NA,K, GLUC, HGB,HCT)
GLUCOSE: 83 mg/dL (ref 65–99)
HEMATOCRIT: 34 % — AB (ref 39.0–52.0)
HEMOGLOBIN: 11.6 g/dL — AB (ref 13.0–17.0)
POTASSIUM: 3.9 mmol/L (ref 3.5–5.1)
SODIUM: 138 mmol/L (ref 135–145)

## 2015-10-27 LAB — GLUCOSE, CAPILLARY
GLUCOSE-CAPILLARY: 108 mg/dL — AB (ref 65–99)
Glucose-Capillary: 108 mg/dL — ABNORMAL HIGH (ref 65–99)
Glucose-Capillary: 102 mg/dL — ABNORMAL HIGH (ref 65–99)
Glucose-Capillary: 134 mg/dL — ABNORMAL HIGH (ref 65–99)
Glucose-Capillary: 225 mg/dL — ABNORMAL HIGH (ref 65–99)

## 2015-10-27 LAB — URINE MICROSCOPIC-ADD ON

## 2015-10-27 MED ORDER — HYDROCODONE-ACETAMINOPHEN 7.5-325 MG PO TABS
1.0000 | ORAL_TABLET | ORAL | Status: DC | PRN
  Administered 2015-10-27: 1 via ORAL
  Administered 2015-10-27 – 2015-10-30 (×5): 2 via ORAL
  Filled 2015-10-27 (×3): qty 2
  Filled 2015-10-27: qty 1
  Filled 2015-10-27 (×2): qty 2

## 2015-10-27 MED ORDER — METHOCARBAMOL 750 MG PO TABS
750.0000 mg | ORAL_TABLET | Freq: Three times a day (TID) | ORAL | Status: DC | PRN
  Administered 2015-10-27 – 2015-10-29 (×4): 750 mg via ORAL
  Filled 2015-10-27 (×4): qty 1

## 2015-10-27 MED ORDER — PHENAZOPYRIDINE HCL 200 MG PO TABS
200.0000 mg | ORAL_TABLET | Freq: Three times a day (TID) | ORAL | Status: DC
  Administered 2015-10-27 – 2015-10-30 (×10): 200 mg via ORAL
  Filled 2015-10-27 (×14): qty 1

## 2015-10-27 MED ORDER — PANTOPRAZOLE SODIUM 40 MG PO TBEC
40.0000 mg | DELAYED_RELEASE_TABLET | Freq: Every day | ORAL | Status: DC
  Administered 2015-10-27 – 2015-10-30 (×4): 40 mg via ORAL
  Filled 2015-10-27 (×4): qty 1

## 2015-10-27 NOTE — Evaluation (Signed)
Occupational Therapy Evaluation Patient Details Name: [name] MRN: [id number] DOB: [birth year] Today's Date: 10/27/2015    History of Present Illness 64 y.o. s/p RIGHT AND CENTRAL LUMBAR LAMINECTOMY L3-4, RIGHT L5-S1 LATERAL RECESS DECOMPRESSION and RELEASE TRIGGER FINGER RIGHT THUMB. Pt is legally blind and has had previous back surgery.   Clinical Impression   Pt s/p above. Pt getting assist with ADLs, PTA. Feel pt will benefit from acute OT to increase independence prior to d/c.    Follow Up Recommendations  CIR    Equipment Recommendations  3 in 1 bedside comode;Other (comment) (other equipment TBD)    Recommendations for Other Services Rehab consult     Precautions / Restrictions Precautions Precautions: Fall;Back Precaution Booklet Issued: No Precaution Comments: legally blind; educated on back precautions Restrictions Weight Bearing Restrictions: Yes RUE Weight Bearing:  (NWB though thumb area?) Other Position/Activity Restrictions: recommended not put weight through thumb area-need to clarify restrictions if any      Mobility Bed Mobility Overal bed mobility: Needs Assistance Bed Mobility: Rolling;Sidelying to Sit Rolling: Min assist Sidelying to sit: Mod assist       General bed mobility comments: cues for technique.  Transfers Overall transfer level: Needs assistance Equipment used: Rolling walker (2 wheeled) Transfers: Sit to/from Stand Sit to Stand: Min assist         General transfer comment: assist to boost. cues given    Balance  Used RW for stand pivot transfer-Min assist.                                            ADL Overall ADL's : Needs assistance/impaired Eating/Feeding: Minimal assistance;Sitting                   Lower Body Dressing: Moderate assistance;Sit to/from stand   Toilet Transfer: Minimal assistance;Stand-pivot;RW (from bed to chair)   Toileting- Clothing Manipulation and Hygiene:  Moderate assistance;Sit to/from stand       Functional mobility during ADLs: Minimal assistance;Rolling walker (stand pivot) General ADL Comments: Discussed LB ADL technique. Pt able to cross one leg fully over knee, but not other. Explained there is AE. Discussed 3 in 1. Instructed pt to elevate RUE. Explained where button was on call bell for nurse.     Vision  Pt legally blind   Perception     Praxis      Pertinent Vitals/Pain Pain Assessment: Faces Faces Pain Scale: Hurts even more Pain Location: when trying to urinate; back Pain Descriptors / Indicators: Moaning Pain Intervention(s): Monitored during session;Repositioned     Hand Dominance     Extremity/Trunk Assessment Upper Extremity Assessment Upper Extremity Assessment: RUE deficits/detail RUE Deficits / Details: edema in digits RUE Sensation: decreased light touch   Lower Extremity Assessment Lower Extremity Assessment: Defer to PT evaluation       Communication Communication Communication: No difficulties   Cognition Arousal/Alertness: Awake/alert Behavior During Therapy: WFL for tasks assessed/performed Overall Cognitive Status: Within Functional Limits for tasks assessed                     General Comments       Exercises       Shoulder Instructions      Home Living Family/patient expects to be discharged to:: Private residence Living Arrangements: Spouse/significant other Available Help at Discharge: Family;Available PRN/intermittently Type of Home: [place]  Access: Level entry     Home Layout: One level     Bathroom Shower/Tub: Teacher, early years/pre: Handicapped height     Home Equipment: Grab bars - toilet;Cane - single point (reports his shoulder chair needs stoppers on bottom)          Prior Functioning/Environment Level of Independence: Needs assistance    ADL's / Homemaking Assistance Needed: assist with bathing and tub transfer        OT  Diagnosis: Acute pain   OT Problem List: Impaired sensation;Decreased knowledge of precautions;Decreased knowledge of use of DME or AE;Decreased activity tolerance;Impaired balance (sitting and/or standing);Decreased range of motion;Impaired vision/perception;Pain;Increased edema;Impaired UE functional use   OT Treatment/Interventions: Self-care/ADL training;DME and/or AE instruction;Therapeutic activities;Patient/family education;Balance training;Visual/perceptual remediation/compensation;Therapeutic exercise    OT Goals(Current goals can be found in the care plan section) Acute Rehab OT Goals Patient Stated Goal: not stated OT Goal Formulation: With patient Time For Goal Achievement: 11/03/15 Potential to Achieve Goals: Good ADL Goals Pt Will Perform Lower Body Bathing: sit to/from stand;with min guard assist;with adaptive equipment;with caregiver independent in assisting Pt Will Perform Lower Body Dressing: with min guard assist;sit to/from stand;with caregiver independent in assisting Pt Will Transfer to Toilet: with min guard assist;ambulating Pt Will Perform Toileting - Clothing Manipulation and hygiene: with min guard assist;sit to/from stand  OT Frequency: Min 2X/week   Barriers to D/C:            Co-evaluation              End of Session Equipment Utilized During Treatment: Gait belt;Rolling walker Nurse Communication: Other (comment) (asked tech to put chair alarm under pt; needs assist feeding)  Activity Tolerance: Patient tolerated treatment well Patient left: in chair;with call bell/phone within reach   Time: 0913-0934 OT Time Calculation (min): 21 min Charges:  OT General Charges $OT Visit: 1 Procedure OT Evaluation $Initial OT Evaluation Tier I: 1 Procedure G-Codes: OT G-codes **NOT FOR INPATIENT CLASS** Functional Assessment Tool Used: clinical judgment Functional Limitation: Self care Self Care Current Status (T2446): At least 40 percent but less than 60  percent impaired, limited or restricted Self Care Goal Status (K8638): At least 1 percent but less than 20 percent impaired, limited or restricted  Benito Mccreedy OTR/L [phone] 10/27/2015, 10:24 AM

## 2015-10-27 NOTE — Evaluation (Signed)
Physical Therapy Evaluation Patient Details Name: [name] MRN: [id number] DOB: 09/27/1951 Today's Date: 10/27/2015   History of Present Illness  64 y.o. s/p RIGHT AND CENTRAL LUMBAR LAMINECTOMY L3-4, RIGHT L5-S1 LATERAL RECESS DECOMPRESSION and RELEASE TRIGGER FINGER RIGHT THUMB. Pt is legally blind and has had previous back surgery.  Clinical Impression  Patient with limited mobility secondary to distracted by pain in penis.  He politely refused pt at first attempt [phone] although provided pt education. Nurse gave him medicines.  Upon returning to room, pain alleviated some although he was preoccupied with urinating on himself.  Patient with difficulty feeding self secondary to vision loss, difficulty finding call button despite repetition/practice and use of tactile prompt, may therefore benefit from soft call bell Archivist notified).  Will trial platform RW to determine if best assists with mobility.  Patient's wife not present to confirm home equipment and available assist.    Follow Up Recommendations Home health PT;Supervision for mobility/OOB    Equipment Recommendations  Rolling walker with 5" wheels;Other (comment) (Rt platform attachment)    Recommendations for Other Services       Precautions / Restrictions Precautions Precautions: Fall;Back Precaution Booklet Issued: Yes (comment) Precaution Comments: verbal review of precautions, left copy for wife to review Restrictions Weight Bearing Restrictions: Yes RUE Weight Bearing:  (NWB through thumb area) Other Position/Activity Restrictions: OT seeking clarification on hand precautions      Mobility  Bed Mobility Overal bed mobility: Needs Assistance Bed Mobility: Sidelying to Sit Rolling: Min assist         General bed mobility comments: using rail  Transfers Overall transfer level: Needs assistance Equipment used: Rolling walker (2 wheeled) Transfers: Sit to/from Stand Sit to Stand: Min  assist         General transfer comment: mod cues for hand placement  Ambulation/Gait                Stairs            Wheelchair Mobility    Modified Rankin (Stroke Patients Only)       Balance Overall balance assessment: Needs assistance Sitting-balance support: No upper extremity supported;Feet supported Sitting balance-Leahy Scale: Good     Standing balance support: Bilateral upper extremity supported Standing balance-Leahy Scale: Fair                               Pertinent Vitals/Pain Pain Assessment: 0-10 Pain Score: 8  Pain Location: penis Pain Descriptors / Indicators: Aching;Burning;Cramping Pain Intervention(s): Limited activity within patient's tolerance;Monitored during session;Premedicated before session;Relaxation    Home Living Family/patient expects to be discharged to:: Private residence Living Arrangements: Spouse/significant other Available Help at Discharge: Family;Available PRN/intermittently Type of Home: Apartment Home Access: Level entry     Home Layout: One level Home Equipment: Cane - single point Additional Comments: unable to fully assess as patient distracted by pain    Prior Function Level of Independence: Independent with assistive device(s)         Comments: with mobility within home environment     Hand Dominance        Extremity/Trunk Assessment   Upper Extremity Assessment: Defer to OT evaluation           Lower Extremity Assessment: Overall WFL for tasks assessed      Cervical / Trunk Assessment: Normal  Communication   Communication: No difficulties  Cognition Arousal/Alertness: Awake/alert Behavior During Therapy: [facility] for  tasks assessed/performed Overall Cognitive Status: Within Functional Limits for tasks assessed       Memory: Decreased recall of precautions              General Comments General comments (skin integrity, edema, etc.): pt complaining of penile  pain that comes in waves, crying first attempt to see pt    Exercises        Assessment/Plan    PT Assessment Patient needs continued PT services  PT Diagnosis Difficulty walking;Acute pain   PT Problem List Decreased activity tolerance;Decreased balance;Decreased mobility;Decreased knowledge of use of DME;Decreased knowledge of precautions;Pain  PT Treatment Interventions DME instruction;Gait training;Functional mobility training;Therapeutic activities;Therapeutic exercise;Balance training;Neuromuscular re-education;Patient/family education   PT Goals (Current goals can be found in the Care Plan section) Acute Rehab PT Goals Patient Stated Goal: make pain stop PT Goal Formulation: With patient Time For Goal Achievement: 11/03/15 Potential to Achieve Goals: Good    Frequency Min 5X/week   Barriers to discharge   will need to further assess when family present    Co-evaluation               End of Session Equipment Utilized During Treatment: Gait belt Activity Tolerance: Patient limited by pain Patient left: in bed;with call bell/phone within reach Nurse Communication: Mobility status;Precautions    Functional Assessment Tool Used: bed mobility, transfers Functional Limitation: Mobility: Walking and moving around Mobility: Walking and Moving Around Current Status (X4128): At least 60 percent but less than 80 percent impaired, limited or restricted Mobility: Walking and Moving Around Goal Status [phone]): At least 20 percent but less than 40 percent impaired, limited or restricted    Time: 7209-4709 PT Time Calculation (min) (ACUTE ONLY): 40 min   Charges:   PT Evaluation $Initial PT Evaluation Tier I: 1 Procedure PT Treatments $Therapeutic Activity: 8-22 mins $Self Care/Home Management: 8-22   PT G Codes:   PT G-Codes **NOT FOR INPATIENT CLASS** Functional Assessment Tool Used: bed mobility, transfers Functional Limitation: Mobility: Walking and moving  around Mobility: Walking and Moving Around Current Status (G2836): At least 60 percent but less than 80 percent impaired, limited or restricted Mobility: Walking and Moving Around Goal Status [phone]): At least 20 percent but less than 40 percent impaired, limited or restricted   Malka So, [place] [phone]  [place] 10/27/2015, 5:35 PM

## 2015-10-27 NOTE — Progress Notes (Signed)
Rehab Admissions Coordinator Note:  Patient was screened by Retta Diones for appropriateness for an Inpatient Acute Rehab Consult.  At this time, patient is currently under observation status and would not meet inpatient rehab criteria for admission.  However, I will follow progress over weekend and check with case manager on Monday regarding observations status.  Call me for questions.    Retta Diones 10/27/2015, 10:49 AM  I can be reached at [phone].

## 2015-10-27 NOTE — Progress Notes (Signed)
Patient ID: [name], male   DOB: 06-19-1951, 64 y.o.   MRN: [id number] Postoperative day 1 lumbar spine surgery. Patient moves his toes and ankles well. Plan for physical therapy progressive ambulation discharge to home on Monday.

## 2015-10-27 NOTE — Progress Notes (Addendum)
Pt c/o pain when voiding and not voiding to penile area. States it started after catheter removal. When asked if it hurt to have the catheter removed pt states it was the worst pain he has had since admission. No blod noted. Pain meds given per MAR. MD aware and pyridium ordered as well. Charge RN have discussed with pt per pt request.  Foley reinserted per MD order, coudey, after bladder scan was done. Charge rn aware of MD call back. Per night shift RN who had pt last night pt reported similar pain on admission from PACU and she DCd foley.

## 2015-10-27 NOTE — Progress Notes (Signed)
     Subjective: 1 Day Post-Op Procedure(s) (LRB): RIGHT AND CENTRAL LUMBAR LAMINECTOMY L3-4, RIGHT L5-S1 LATERAL RECESS DECOMPRESSION (N/A) RELEASE TRIGGER FINGER RIGHT THUMB (Right) I hurt all night till they gave me something, after they took the foley out I have been having burning Pain down there and pain when I urinate. I am having spasm in my back. Most of the discomfort is In the back. The right hand no longer pops, what should I do with the right hand. Patient reports pain as marked.    Objective:   VITALS:  Temp:  [97.5 F (36.4 C)-99.3 F (37.4 C)] 99.3 F (37.4 C) (11/05 0939) Pulse Rate:  [69-86] 82 (11/05 1102) Resp:  [9-24] 18 (11/05 0939) BP: (105-161)/(44-92) 123/64 mmHg (11/05 1102) SpO2:  [97 %-100 %] 97 % (11/05 0939)  Neurologically intact ABD soft Neurovascular intact Sensation intact distally Intact pulses distally Incision: scant drainage dressing changed incision is dry no fluctuance or warmth. In PACU he had episode of hypoglycemia, no medications morning of his surgery, presently CBG 120-150s. Wife noted yesterday post surgery that he has been having Episodic hypogylcemic attacks at home. Presently on a sliding scale of insulin, he is  Normally on oral and insulin medications.  LABS  Recent Labs  10/26/15 1503 10/27/15 0541  HGB 11.6* 9.9*  WBC  --  6.4  PLT  --  164    Recent Labs  10/26/15 1503  NA 138  K 3.9  GLUCOSE 83   No results for input(s): LABPT, INR in the last 72 hours.   Assessment/Plan: 1 Day Post-Op Procedure(s) (LRB): RIGHT AND CENTRAL LUMBAR LAMINECTOMY L3-4, RIGHT L5-S1 LATERAL RECESS DECOMPRESSION (N/A) RELEASE TRIGGER FINGER RIGHT THUMB (Right) Anemia, post operative blood loss (300cc) on mild chronic (?IDDM chronic disease) History of previous severe epidural abscess post previous lumbar surgery, no sign of Infection POD#1 But it is much to early post op and will continue to surveillance. Diabetes Mellitus,  Insulin and Oral medication controlled, [name] is primary care, has Been having episodes of hypoglycemia. HgA1c 6.7 Blind.  Advance diet Up with therapy  Try pyridium for urethral irritation from foley. Methacarbamol for muscular spasm and increase norco to 7.5 mg tablets. Ask [facility] to see him and help [place] with regulating his diabetes and medications. OT to work with the right hand ROM.  NITKA,JAMES E 10/27/2015, 12:06 PM

## 2015-10-27 NOTE — Consult Note (Signed)
[facility] Medical Consultation  [name] WUJ:[id number] DOB: 12-22-1951 DOA: 10/26/2015 PCP: Elijah Calico, MD   Requesting physician: Elijah Dess, MD Date of consultation: 10/27/15 Reason for consultation: hypoglycemia  Impression/Recommendations  Hypoglycemia in setting of diabetes mellitus.  One episode of hypoglycemia around 2 AM last week.  - Patient needs to check his capillary blood glucose on a daily basis, especially since on BID insulin at home. He admits to checking CBGs only a few times a week, it would be prudent to include a 2 AM CBG at home a few times a week to evaluate for nocturnal hypoglycemia. -Recommend patient keep a log of CBGs at home for PCPs review - take morning and evening insulin at regularly scheduled times  -Patient needs to be consistent with bedtime snacks  -Will consult diabetes coordinator.  -Upon discharge recommend decreasing patient's evening dose of Humalog 75/25 from 45 units to 35 units.  -He will need close follow-up with PCP  Spinal stenosis. Patient is s/p lumbar laminectomy L3-4 yesterday ( and release of trigger finger), management per primary team.      I will followup again tomorrow. Please contact me if I can be of assistance in the meanwhile. Thank you for this consultation.  Chief Complaint: low blood sugars at home  HPI:  Patient is a 64 year old male with multiple medical problems not limited to type II diabtes, CAD, and chronic systolic CHF.  Yesterday patient underwent back and trigger finger surgery.  In the recovery area patient experienced a drop in blood sugar to 65. Orthopedic Surgeon spoke with the family, patient reported hyperglycemic events at home. We were asked to evaluate home diabetic regimen. Hemoglobin A1c 6.7 this admission.  Patient does not check CBGs on a daily basis. She takes 85 units of Humalog 75/20 5 in the morning and 45 units in the evening.. Patient takes these injections at various times,  sometimes evening dose as late as midnight. Patient typically eats a snack after evening insulin. He reports one recent episode of hypoglycemia last week. Patient was "talking out of his head" around 2 AM. Wife awoke patient, checked blood sugar which was in the forties. After juice and candy blood sugar rose to the fifties and eventually over 100. Patient was late with evening dose (took it at midnight) with no snack afterwards.    At home patient takes Humalog 7030 85 units every morning and 45 units every evening.   Review of Systems:  Constitutional: Denies fever, chills, diaphoresis, appetite change and fatigue.  HEENT: Denies photophobia, eye pain, redness, hearing loss, ear pain, congestion, sore throat, rhinorrhea, sneezing, mouth sores, trouble swallowing, neck pain, neck stiffness and tinnitus.  Respiratory: Denies SOB, DOE, cough, chest tightness, and wheezing.  Cardiovascular: Denies chest pain, palpitations and leg swelling.  Gastrointestinal: Denies nausea, vomiting, abdominal pain, diarrhea, constipation, blood in stool and abdominal distention.  Genitourinary: Denies dysuria, urgency, frequency, hematuria, flank pain and difficulty urinating.  Musculoskeletal: Denies myalgias, back pain, joint swelling, arthralgias and gait problem.  Skin: Denies pallor, rash and wound.  Neurological: Denies dizziness, seizures, syncope, weakness, light-headedness, numbness and headaches.  Hematological: Denies adenopathy. Easy bruising, personal or family bleeding history  Psychiatric/Behavioral: Denies suicidal ideation, mood changes, confusion, nervousness, sleep disturbance and agitation    Past Medical History  Diagnosis Date  . Glaucoma   . Ulcer   . HTN (hypertension)   . Hypercholesterolemia   . Diabetes mellitus     Type II  . CHF (  congestive heart failure) ([place])   . CAD (coronary artery disease)   . LBP (low back pain)   . Legally blind   . Complication of anesthesia      "lungs filled up with fluid"   . Staph infection     "from back surgery"  . Wears glasses     "to protect cornea"  . Heart murmur   . Sleep apnea   . Trigger finger   . Arthritis     "mild arthritis in hip"   Past Surgical History  Procedure Laterality Date  . Prosthetic cornea placement, right eye  2007    [facility]  . Lumbar fusion    . Peptic ulcer dz surgery  pt was in his 20s    bleeding ulcer.   . Cholecystectomy  09/15/2012    Procedure: LAPAROSCOPIC CHOLECYSTECTOMY WITH INTRAOPERATIVE CHOLANGIOGRAM;  Surgeon: Pedro Earls, MD;  Location: [place];  Service: General;  Laterality: N/A;  . Inguinal hernia repair      right  . Eye surgery Bilateral     cataracts  . Neck surgery     Social History:  reports that he has never smoked. He has never used smokeless tobacco. He reports that he does not drink alcohol or use illicit drugs.   Patient lives at home with wife. He had been using a walker for ambulation   Allergies  Allergen Reactions  . Furosemide     pancreatitis  . Diamox [Acetazolamide] Itching  . Lisinopril Rash   Family History  Problem Relation Age of Onset  . Breast cancer Mother   . Colon cancer Mother   . Colon cancer Other     Elevated Risk for  . Hypertension Mother   . Hypertension Father   . Hypertension Father   . Diabetes Mother   . Diabetes Sister   . Diabetes Brother     Prior to Admission medications   Medication Sig Start Date End Date Taking? Authorizing Provider  Blood Glucose Calibration (ONETOUCH VERIO) SOLN 1 Act by In Vitro route 2 (two) times daily. 08/22/15  Yes [name], MD  Blood Glucose Monitoring Suppl (ONETOUCH VERIO IQ SYSTEM) W/DEVICE KIT 1 Act by Does not apply route 2 (two) times daily. 08/22/15  Yes [name], MD  Blood Glucose Monitoring Suppl (PRODIGY VOICE BLOOD GLUCOSE) W/DEVICE KIT Use to check blood sugars twice a day Dx e11.9 08/08/15  Yes [name], MD  carvedilol (COREG) 12.5 MG tablet Take  1 tablet (12.5 mg total) by mouth 2 (two) times daily with a meal. 08/21/15  Yes [name], MD  cyanocobalamin 2000 MCG tablet Take 1 tablet (2,000 mcg total) by mouth daily. 08/28/15  Yes [name], MD  ezetimibe (ZETIA) 10 MG tablet Take 1 tablet (10 mg total) by mouth daily. 08/22/15  Yes [name], MD  gabapentin (NEURONTIN) 300 MG capsule TAKE 1 CAPSULE (300 MG TOTAL) BY MOUTH 3 (THREE) TIMES DAILY. 07/23/15  Yes Lyndal Pulley, DO  glucose blood [facility] VERIO) test strip Use BID 08/22/15  Yes [name], MD  glucose blood test strip 1 each by Other route 2 (two) times daily. Use to check blood sugars twice a day Dx E11.9 08/08/15  Yes [name], MD  insulin lispro protamine-lispro (HUMALOG 75/25 MIX) (75-25) 100 UNIT/ML SUSP injection Inject 45-85 Units into the skin 2 (two) times daily with a meal. Use 85 units every morning and use 45  units every evening 08/21/15  Yes [name], MD  INSULIN SYRINGE 1CC/29G (B-D INSULIN SYRINGE) 29G X 1/2" 1 ML MISC Use as directed with insulin vials 08/21/15  Yes [name], MD  isosorbide mononitrate (IMDUR) 120 MG 24 hr tablet Take 1 tablet (120 mg total) by mouth daily. 12/27/14  Yes Jerline Pain, MD  losartan (COZAAR) 50 MG tablet Take 1 tablet (50 mg total) by mouth daily. 12/27/14  Yes Jerline Pain, MD  omega-3 acid ethyl esters (LOVAZA) 1 G capsule Take 2 capsules (2 g total) by mouth 2 (two) times daily. 07/11/15  Yes [name], MD  pravastatin (PRAVACHOL) 40 MG tablet TAKE 1 TABLET (40 MG TOTAL) BY MOUTH DAILY. DAILY 08/21/15  Yes [name], MD  PRODIGY LANCETS 28G MISC Use to check blood sugars twice a day Dx e11.9 08/08/15  Yes [name], MD  traMADol (ULTRAM) 50 MG tablet TAKE 1 TO 2 TABLETS BY MOUTH EVERY 4 TO 6 HOURS AS NEEDED FOR PAIN (INS PAYS FOR 8 TABS PER DAY) 08/08/15  Yes Historical Provider, MD   Physical Exam: Blood pressure 123/64, pulse 82, temperature 99.3 F (37.4 C), temperature source Oral,  resp. rate 18, height 5' 10.5" (1.791 m), weight 109.68 kg (241 lb 12.8 oz), SpO2 97 %. Filed Vitals:   10/27/15 1102  BP: 123/64  Pulse: 82  Temp:   Resp:     Constitutional: Vital signs reviewed. Patient is a well-developed black male in no acute distress and cooperative with exam. Alert and oriented x3.  Head: Normocephalic and atraumatic  Ear: Normal hearing  Mouth: no erythema or exudates, MMM  Eyes: PER,  conjunctivae normal, No scleral icterus.  Neck: Supple, Trachea midline normal ROM, No JVD, or mass present.  Cardiovascular: RRR, no MRG, pulses symmetric and intact bilaterally  Pulmonary/Chest: CTAB, no wheezes, rales, or rhonchi  Abdominal: Soft. Non-tender, non-distended, bowel sounds are normal, no masses, organomegaly, or guarding present.  GU: no CVA tenderness Musculoskeletal: No joint deformities, erythema Ext: no edema and no cyanosis, pulses palpable bilaterally (DP and PT)  Neurological: A&O x3, Strenght is normal and symmetric bilaterally, cranial nerve II-XII are grossly intact, no focal motor deficit, sensory intact to light touch bilaterally.  Skin: Warm, dry and intact. No rash, cyanosis, or clubbing.  Psychiatric: Normal mood and affect. speech and behavior is normal. Judgment and thought content normal. Cognition and memory are normal.   Labs on Admission:  Basic Metabolic Panel:  Recent Labs Lab 10/23/15 1525 10/26/15 1503  NA 137 138  K 4.3 3.9  CL 105  --   CO2 24  --   GLUCOSE 98 83  BUN 14  --   CREATININE 1.13  --   CALCIUM 8.9  --    Liver Function Tests:  Recent Labs Lab 10/23/15 1525  AST 21  ALT 16*  ALKPHOS 42  BILITOT 0.8  PROT 6.5  ALBUMIN 3.4*    CBC:  Recent Labs Lab 10/23/15 1525 10/26/15 1503 10/27/15 0541  WBC 6.7  --  6.4  HGB 11.9* 11.6* 9.9*  HCT 36.6* 34.0* 30.3*  MCV 70.0*  --  70.5*  PLT 232  --  164    CBG:  Recent Labs Lab 10/26/15 1801 10/26/15 1855 10/26/15 2048 10/26/15 2341  10/27/15 0434  GLUCAP 65 131* 106* 84 108*    Radiological Exams on Admission: Dg Lumbar Spine 2-3 Views  10/26/2015  CLINICAL DATA:  Laminectomy L3-4 and  L5-S1. EXAM: LUMBAR SPINE - 2-3 VIEW COMPARISON:  Lumbar MRI 07/12/2015 FINDINGS: Three lateral views of the lumbar spine were obtained in the operating room. Image number 1 reveals a needle posterior to the L3 spinous process. A second needle overlies the S2 spinous process Image number 2 reveals an instrument between the spinous processes of L2 and L3. Second instrument is directed toward the lamina of L5 at the L5-S1 level. Image number 3 reveals a surgical instrument under the lamina of L3 directed at L3-4. Second instrument overlying the canal at the L5-S1 level. IMPRESSION: L3-4 and L5-S1 localized in the operating room. Electronically Signed   By: Franchot Gallo M.D.   On: 10/26/2015 16:08    Time spent: 52 minutes  Tye Savoy [facility] Pager [phone]  If 7PM-7AM, please contact night-coverage [email or web address] Password [facility] 10/27/2015, 12:32 PM

## 2015-10-28 DIAGNOSIS — M4806 Spinal stenosis, lumbar region: Secondary | ICD-10-CM | POA: Diagnosis not present

## 2015-10-28 DIAGNOSIS — R339 Retention of urine, unspecified: Secondary | ICD-10-CM | POA: Diagnosis not present

## 2015-10-28 DIAGNOSIS — E162 Hypoglycemia, unspecified: Secondary | ICD-10-CM

## 2015-10-28 DIAGNOSIS — E118 Type 2 diabetes mellitus with unspecified complications: Secondary | ICD-10-CM

## 2015-10-28 LAB — GLUCOSE, CAPILLARY
GLUCOSE-CAPILLARY: 129 mg/dL — AB (ref 65–99)
Glucose-Capillary: 154 mg/dL — ABNORMAL HIGH (ref 65–99)

## 2015-10-28 MED ORDER — TAMSULOSIN HCL 0.4 MG PO CAPS
0.4000 mg | ORAL_CAPSULE | Freq: Every day | ORAL | Status: DC
  Administered 2015-10-28 – 2015-10-30 (×3): 0.4 mg via ORAL
  Filled 2015-10-28 (×3): qty 1

## 2015-10-28 MED ORDER — INSULIN ASPART PROT & ASPART (70-30 MIX) 100 UNIT/ML ~~LOC~~ SUSP
25.0000 [IU] | Freq: Two times a day (BID) | SUBCUTANEOUS | Status: DC
  Administered 2015-10-28 – 2015-10-30 (×6): 25 [IU] via SUBCUTANEOUS
  Filled 2015-10-28: qty 10

## 2015-10-28 MED ORDER — FERROUS GLUCONATE 324 (38 FE) MG PO TABS
324.0000 mg | ORAL_TABLET | Freq: Every day | ORAL | Status: DC
  Administered 2015-10-29 – 2015-10-30 (×2): 324 mg via ORAL
  Filled 2015-10-28 (×4): qty 1

## 2015-10-28 MED ORDER — CEPHALEXIN 500 MG PO CAPS
500.0000 mg | ORAL_CAPSULE | Freq: Two times a day (BID) | ORAL | Status: DC
  Administered 2015-10-28 – 2015-10-30 (×5): 500 mg via ORAL
  Filled 2015-10-28 (×5): qty 1

## 2015-10-28 NOTE — Care Management Note (Signed)
Case Management Note  Patient Details  Name: [name] MRN: [id number] Date of Birth: [birth year]  Subjective/Objective:                  RIGHT AND CENTRAL LUMBAR LAMINECTOMY L3-4, RIGHT L5-S1 LATERAL RECESS DECOMPRESSION RELEASE TRIGGER FINGER RIGHT THUMB  Action/Plan: Cm spoke to patient and spouse at the bedside. Patient is legally blind and lives at home with spouse who he states is able to provide needed support. Patient agreeable to [facility] PT. CM offered choice and patient/Spouse chose [facility] for [facility] and DME needs. Patient has shower chair at home that needs repair. CM offered to get tub bench delivered to the bedside and family declined. CM provided list of Medical supply stores per their request. CM called and spoke with Tiffany at [facility] and advised of [facility] PT and best contact number for spouse is [phone]. Referral accepted. Cm called Merry Proud with [facility] dme to request RW with arm platform be delivered to the bedside. Pt said that he has no difficulty with obtaining medications and declined further needs at this time. Cm remains available should further discharge planning needs arise.   Expected Discharge Date:  10/29/15            Expected Discharge Plan:  [place]  In-House Referral:     Discharge planning Services  CM Consult  Post Acute Care Choice:  Durable Medical Equipment, Home Health Choice offered to:  Patient, Spouse  DME Arranged:  Walker rolling, Geophysicist/field seismologist DME Agency:  [place]:  PT [place]:  [place]  Status of Service:  In process, will continue to follow  Medicare Important Message Given:    Date Medicare IM Given:    Medicare IM give by:    Date Additional Medicare IM Given:    Additional Medicare Important Message give by:     If discussed at [place] of Stay Meetings, dates discussed:    Additional Comments:  Guido Sander, RN 10/28/2015, 12:50 PM

## 2015-10-28 NOTE — Progress Notes (Addendum)
I spoke with Dr. Junious Silk MD. Urology, he recommended that the patient keep foley for at least 5 Days then be seen back at [facility] urology for a void trial. Also recommended starting flomax and keflex up to BID. Family should call for an appointment late this week for a voiding trial .

## 2015-10-28 NOTE — Progress Notes (Signed)
     Subjective: 2 Days Post-Op Procedure(s) (LRB): RIGHT AND CENTRAL LUMBAR LAMINECTOMY L3-4, RIGHT L5-S1 LATERAL RECESS DECOMPRESSION (N/A) RELEASE TRIGGER FINGER RIGHT THUMB (Right) Seen by Internal medicine, evening insulin dose adjusted to 35 units, diabetes coordinator to  Work with patient to facilitate log of CBGs and medication adjustment. He does reach as low as 30-45 at night. Yesterday with severe pain in his penis post foley d/c concerned that the bulb May have not been deflated. Due to increasing severe pain bladder scan was done and foley  Replaced with nearly one liter of urine returned. Pain improved some but still with pain at the Penis. Patient reports pain as moderate.    Objective:   VITALS:  Temp:  [98.2 F (36.8 C)-100.7 F (38.2 C)] 98.2 F (36.8 C) (11/06 0641) Pulse Rate:  [87-94] 87 (11/06 0641) Resp:  [18] 18 (11/06 0641) BP: (101-144)/(44-65) 127/55 mmHg (11/06 0641) SpO2:  [96 %-98 %] 97 % (11/06 0641)  Neurologically intact ABD soft Dorsiflexion/Plantar flexion intact Incision: dressing C/D/I   LABS  Recent Labs  10/26/15 1503 10/27/15 0541 10/27/15 1420  HGB 11.6* 9.9* 13.5  WBC  --  6.4 7.2  PLT  --  164 156    Recent Labs  10/26/15 1503  NA 138  K 3.9  GLUCOSE 83   No results for input(s): LABPT, INR in the last 72 hours.   Assessment/Plan: 2 Days Post-Op Procedure(s) (LRB): RIGHT AND CENTRAL LUMBAR LAMINECTOMY L3-4, RIGHT L5-S1 LATERAL RECESS DECOMPRESSION (N/A) RELEASE TRIGGER FINGER RIGHT THUMB (Right)  Advance diet Up with therapy D/C IV fluids Plan for discharge tomorrow Discharge home with home health  Tyrea Froberg E 10/28/2015, 2:35 PM

## 2015-10-28 NOTE — Progress Notes (Signed)
CONSULT PROGRESS NOTE  [name] TKW:[id number] DOB: [birth year] DOA: 10/26/2015 PCP: [name], MD  Assessment/Plan: Hypoglycemia in setting of diabetes mellitus.  -Recommend patient keep a log of CBGs at home for PCPs review -does not have a meter that talks to him currently - take morning and evening insulin at regularly scheduled times  -Patient needs to be consistent with bedtime snacks  -Will consult diabetes coordinator.  -suspect not compliant with diet at home -recent weight loss of about 40 lbs -decrease dose of 70/30 here and monitor -He will need close follow-up with PCP  Code Status: full Family Communication: patient Disposition Plan: per primary    HPI/Subjective: Eating breakfast  Objective: Filed Vitals:   10/28/15 0641  BP: 127/55  Pulse: 87  Temp: 98.2 F (36.8 C)  Resp: 18    Intake/Output Summary (Last 24 hours) at 10/28/15 0839 Last data filed at 10/28/15 9242  Gross per 24 hour  Intake    600 ml  Output   2950 ml  Net  -2350 ml   Filed Weights   10/26/15 1059  Weight: 109.68 kg (241 lb 12.8 oz)    Exam:   General:  Awake, NAD   Data Reviewed: Basic Metabolic Panel:  Recent Labs Lab 10/23/15 1525 10/26/15 1503  NA 137 138  K 4.3 3.9  CL 105  --   CO2 24  --   GLUCOSE 98 83  BUN 14  --   CREATININE 1.13  --   CALCIUM 8.9  --    Liver Function Tests:  Recent Labs Lab 10/23/15 1525  AST 21  ALT 16*  ALKPHOS 42  BILITOT 0.8  PROT 6.5  ALBUMIN 3.4*   No results for input(s): LIPASE, AMYLASE in the last 168 hours. No results for input(s): AMMONIA in the last 168 hours. CBC:  Recent Labs Lab 10/23/15 1525 10/26/15 1503 10/27/15 0541 10/27/15 1420  WBC 6.7  --  6.4 7.2  NEUTROABS  --   --   --  5.1  HGB 11.9* 11.6* 9.9* 13.5  HCT 36.6* 34.0* 30.3* 41.1  MCV 70.0*  --  70.5* 68.5*  PLT 232  --  164 156   Cardiac Enzymes: No results for input(s): CKTOTAL, CKMB, CKMBINDEX, TROPONINI in the last 168  hours. BNP (last 3 results) No results for input(s): BNP in the last 8760 hours.  ProBNP (last 3 results) No results for input(s): PROBNP in the last 8760 hours.  CBG:  Recent Labs Lab 10/27/15 0434 10/27/15 0902 10/27/15 1234 10/27/15 1652 10/27/15 2237  GLUCAP 108* 102* 108* 134* 225*    Recent Results (from the past 240 hour(s))  Surgical pcr screen     Status: Abnormal   Collection Time: 10/23/15  3:24 PM  Result Value Ref Range Status   MRSA, PCR NEGATIVE NEGATIVE Final   Staphylococcus aureus POSITIVE (A) NEGATIVE Final    Comment:        The Xpert SA Assay (FDA approved for NASAL specimens in patients over 84 years of age), is one component of a comprehensive surveillance program.  Test performance has been validated by [facility] for patients greater than or equal to 19 year old. It is not intended to diagnose infection nor to guide or monitor treatment.      Studies: Dg Lumbar Spine 2-3 Views  10/26/2015  CLINICAL DATA:  Laminectomy L3-4 and L5-S1. EXAM: LUMBAR SPINE - 2-3 VIEW COMPARISON:  Lumbar MRI 07/12/2015 FINDINGS: Three lateral views of the  lumbar spine were obtained in the operating room. Image number 1 reveals a needle posterior to the L3 spinous process. A second needle overlies the S2 spinous process Image number 2 reveals an instrument between the spinous processes of L2 and L3. Second instrument is directed toward the lamina of L5 at the L5-S1 level. Image number 3 reveals a surgical instrument under the lamina of L3 directed at L3-4. Second instrument overlying the canal at the L5-S1 level. IMPRESSION: L3-4 and L5-S1 localized in the operating room. Electronically Signed   By: Franchot Gallo M.D.   On: 10/26/2015 16:08    Scheduled Meds: . carvedilol  12.5 mg Oral BID WC  . docusate sodium  100 mg Oral BID  . gabapentin  300 mg Oral TID  . insulin aspart  0-20 Units Subcutaneous 6 times per day  . insulin aspart protamine- aspart  25 Units  Subcutaneous BID WC  . isosorbide mononitrate  120 mg Oral Daily  . losartan  50 mg Oral Daily  . omega-3 acid ethyl esters  2 capsule Oral BID  . pantoprazole  40 mg Oral Daily  . phenazopyridine  200 mg Oral TID WC  . sodium chloride  3 mL Intravenous Q12H  . traMADol  100 mg Oral 4 times per day  . cyanocobalamin  2,000 mcg Oral Daily   Continuous In-fusions: . sodium chloride    . sodium chloride 75 mL/hr at 10/26/15 2046  . lactated ringers 10 mL/hr at 10/26/15 1146   Antibiotics Given (last 72 hours)    Date/Time Action Medication Dose Rate   10/26/15 1330 Given   ceFAZolin (ANCEF) IVPB 2 g/50 mL premix 2 g    10/26/15 1710 Given   ceFAZolin (ANCEF) IVPB 2 g/50 mL premix 2 g    10/26/15 2047 Given   ceFAZolin (ANCEF) IVPB 1 g/50 mL premix 1 g 100 mL/hr   10/27/15 0645 Given   ceFAZolin (ANCEF) IVPB 1 g/50 mL premix 1 g 100 mL/hr      Principal Problem:   Spinal stenosis, lumbar region, with neurogenic claudication Active Problems:   Trigger thumb of right hand   Neurogenic claudication due to lumbar spinal stenosis   Postoperative anemia due to acute blood loss   Surgery, elective   Hypoglycemia   Type 2 diabetes mellitus with complication ([place])    Time spent: 15 min    [name]  [facility] Pager [phone]  M-7AM, please contact night-coverage at [email or web address], password [facility] 10/28/2015, 8:39 AM

## 2015-10-28 NOTE — Progress Notes (Signed)
Physical Therapy Treatment Patient Details Name: [name] MRN: [id number] DOB: 1951-08-04 Today's Date: 10/28/2015    History of Present Illness 64 y.o. s/p RIGHT AND CENTRAL LUMBAR LAMINECTOMY L3-4, RIGHT L5-S1 LATERAL RECESS DECOMPRESSION and RELEASE TRIGGER FINGER RIGHT THUMB. Pt is legally blind and has had previous back surgery.    PT Comments    [name] made excellent progress and ambulated 80 ft in hallway w/ Rt platform RW w/ min assist for directing as pt is legally blind.  New onset Rt LE weakness noted as of today 11/6 and RN aware and contacting MD.  Per pt, MD already aware as pt reported to MD earlier today.  Pt's wife reports she will be available to assist pt 24/7 at d/c.   Follow Up Recommendations  Home health PT;Supervision for mobility/OOB     Equipment Recommendations  Rolling walker with 5" wheels;Other (comment) (Rt platform attachment)    Recommendations for Other Services       Precautions / Restrictions Precautions Precautions: Fall;Back Precaution Comments: Pt able to recall all back precautions Restrictions Weight Bearing Restrictions: Yes RUE Weight Bearing:  (NWB through thumb area) Other Position/Activity Restrictions: OT seeking clarification on hand precautions    Mobility  Bed Mobility Overal bed mobility: Needs Assistance Bed Mobility: Sidelying to Sit;Rolling Rolling: Min assist Sidelying to sit: Min guard       General bed mobility comments: Min assist managing Bil LEs to achieve rolling.  Pt requires increased time for all bed mobility and uses bed rail.  Cues for technique throughout.  Transfers Overall transfer level: Needs assistance Equipment used: Right platform walker Transfers: Sit to/from Stand Sit to Stand: Min assist         General transfer comment: Tactile cues for Rt hand placement on platform RW.  Min assist w/ stabilizing RW during sit<>stand.    Ambulation/Gait Ambulation/Gait assistance: Min  assist Ambulation Distance (Feet): 80 Feet Assistive device: Right platform walker Gait Pattern/deviations: Step-through pattern;Antalgic;Trunk flexed   Gait velocity interpretation: Below normal speed for age/gender General Gait Details: Tactile and verbal cues and min assist at times directing pt in hallway as he is legally blind.     Stairs            Wheelchair Mobility    Modified Rankin (Stroke Patients Only)       Balance Overall balance assessment: Needs assistance Sitting-balance support: No upper extremity supported;Feet supported Sitting balance-Leahy Scale: Good     Standing balance support: Bilateral upper extremity supported;During functional activity Standing balance-Leahy Scale: Fair                      Cognition Arousal/Alertness: Awake/alert Behavior During Therapy: WFL for tasks assessed/performed Overall Cognitive Status: Within Functional Limits for tasks assessed                      Exercises General Exercises - Lower Extremity Ankle Circles/Pumps: AROM;10 reps;Seated;Limitations;Left Ankle Circles/Pumps Limitations: unable on Rt LE as pt has newly onset Rt LE weakness as of today 11/6, RN aware and RN notifying MD.    General Comments General comments (skin integrity, edema, etc.): Pt reports new onset today 11/6 of Rt LE weakness and inability to wiggle his toes or pump his feet.  Sensation WNL but generally strength 3/5.  Pt says he reported this to his MD earlier today.  RN made aware and RN contacting MD of new status.  Notified nurse tech as well  to look out for signs and symptoms of new stroke.  Now weakness noted in face or Rt UE, no slurring words. Pt is legally blind and usually walks along wall to tactile directional cues around the house.  Discussed w/ pt's wife that pt will not be able to do this w/ platform RW and therefore wife will need to be w/ pt during any ambulation, pt verbalizes understanding and reports she  will be available for 24/7 assist at home at d/c.      Pertinent Vitals/Pain Pain Assessment: 0-10 Pain Score: 5  Pain Location: back Pain Descriptors / Indicators: Aching;Discomfort Pain Intervention(s): Limited activity within patient's tolerance;Monitored during session;Repositioned    Home Living                      Prior Function            PT Goals (current goals can now be found in the care plan section) Acute Rehab PT Goals Patient Stated Goal: to go home once ready PT Goal Formulation: With patient Time For Goal Achievement: 11/03/15 Potential to Achieve Goals: Good Progress towards PT goals: Progressing toward goals    Frequency  Min 5X/week    PT Plan Current plan remains appropriate    Co-evaluation             End of Session Equipment Utilized During Treatment: Gait belt Activity Tolerance: Patient limited by fatigue Patient left: with call bell/phone within reach;in chair;with family/visitor present     Time: 9371-6967 PT Time Calculation (min) (ACUTE ONLY): 26 min  Charges:  $Gait Training: 23-37 mins                    G Codes:      Joslyn Hy PT, [place] [phone] Pager: [phone] 10/28/2015, 5:47 PM

## 2015-10-28 NOTE — Progress Notes (Signed)
Patient ID: [name], male   DOB: 07-27-1951, 64 y.o.   MRN: [id number] Patient has no complaints this morning. Plan for discharge on Monday.

## 2015-10-28 NOTE — Progress Notes (Signed)
Cm provided patient and family with Obs medicare letter. Copy place in chart, one given to patient and family (it was signed by the wife as patient is blind), and copy given to [employer]

## 2015-10-29 ENCOUNTER — Encounter: Payer: Self-pay | Admitting: Gastroenterology

## 2015-10-29 ENCOUNTER — Encounter (HOSPITAL_COMMUNITY): Payer: Self-pay | Admitting: Specialist

## 2015-10-29 ENCOUNTER — Observation Stay (HOSPITAL_COMMUNITY): Payer: Medicare Other

## 2015-10-29 DIAGNOSIS — M6289 Other specified disorders of muscle: Secondary | ICD-10-CM

## 2015-10-29 DIAGNOSIS — M4806 Spinal stenosis, lumbar region: Secondary | ICD-10-CM | POA: Diagnosis not present

## 2015-10-29 LAB — GLUCOSE, CAPILLARY
Glucose-Capillary: 135 mg/dL — ABNORMAL HIGH (ref 65–99)
Glucose-Capillary: 139 mg/dL — ABNORMAL HIGH (ref 65–99)
Glucose-Capillary: 125 mg/dL — ABNORMAL HIGH (ref 65–99)
Glucose-Capillary: 115 mg/dL — ABNORMAL HIGH (ref 65–99)
Glucose-Capillary: 137 mg/dL — ABNORMAL HIGH (ref 65–99)
Glucose-Capillary: 124 mg/dL — ABNORMAL HIGH (ref 65–99)

## 2015-10-29 LAB — HEMOGLOBIN A1C
Hgb A1c MFr Bld: 6.4 % — ABNORMAL HIGH (ref 4.8–5.6)
Mean Plasma Glucose: 137 mg/dL

## 2015-10-29 NOTE — Progress Notes (Signed)
Inpatient Diabetes Program Recommendations  AACE/ADA: New Consensus Statement on Inpatient Glycemic Control (2015)  Target Ranges:  Prepandial:   less than 140 mg/dL      Peak postprandial:   less than 180 mg/dL (1-2 hours)      Critically ill patients:  140 - 180 mg/dL   Review of Glycemic Control:  Results for EZRAEL, SAM (MRN [id number]) as of 10/29/2015 14:16  Ref. Range 10/28/2015 11:35 10/28/2015 15:36 10/28/2015 21:04 10/29/2015 07:12 10/29/2015 11:44  Glucose-Capillary Latest Ref Range: 65-99 mg/dL 129 (H) 154 (H) 139 (H) 125 (H) 115 (H)   Diabetes history: Type 2 diabetes Outpatient Diabetes medications: Humalog 75/25 85 units q AM and 45 units q Pm Current orders for Inpatient glycemic control:  Novolog 70/30 mix 25 units bid, Novolog resistant tid with meals.  Inpatient Diabetes Program Recommendations:    CBG's well controlled in hospital.  Spoke with patient and wife regarding diabetes management.  Wife states that blood sugars have been controlled over the past year however he has been having occasional lows in the middle of the night.  Briefly discussed with patient and wife the profile of 75/25 and explained that 75% of the insulin peaks in the middle of the night (when given with supper).  Encouraged them to check his blood sugar prior to bed and to eat a snack at bedtime to prevent lows in the middle of the night.  Note that patient is on much lower dose of insulin in the hospital than at home.  Explained that MD will likely decrease home doses of insulin at discharge.  Will need follow-up with PCP regarding blood sugars. Also discussed the [employer] talking meter with patient and wife.  He has this meter at home but wife states that it stopped working recently.  Encouraged them to call the 1-800 number on the back of the meter and that they will help them trouble shoot or send them a new meter. She states that they do have a working meter that does not talk and due to patient's  blindness, blindness he is unable to independently check his blood sugars anyway.  May benefit from home health RN at discharge if appropriate.  Wife seems very involved in his care and helps patient in caring for his diabetes.    Thanks, Adah Perl, RN, BC-ADM Inpatient Diabetes Coordinator Pager [phone] (8a-5p)

## 2015-10-29 NOTE — Progress Notes (Signed)
Occupational Therapy Treatment Patient Details Name: [name] MRN: [id number] DOB: April 03, 1951 Today's Date: 10/29/2015    History of present illness 65 y.o. s/p RIGHT AND CENTRAL LUMBAR LAMINECTOMY L3-4, RIGHT L5-S1 LATERAL RECESS DECOMPRESSION and RELEASE TRIGGER FINGER RIGHT THUMB. Pt is legally blind and has had previous back surgery.   OT comments  Pt making progress toward OT goals. Pt able to perform stand pivot transfer from chair to bed with min assist to boost and max verbal and tactile cues. Educated pt and wife on R hand exercises to be completed 3 x per day; pt demonstrated understanding of exercises and wife verbalized understanding. D/c plan has been upgraded to [facility] for follow up in order to maximize independence and safety with ADLs and functional mobility. Will continue to follow pt acutely.    Follow Up Recommendations  Home health OT;Supervision/Assistance - 24 hour    Equipment Recommendations  3 in 1 bedside comode    Recommendations for Other Services      Precautions / Restrictions Precautions Precautions: Fall;Back Precaution Comments: Pt able to recall all back precautions Restrictions Weight Bearing Restrictions: Yes RUE Weight Bearing: Non weight bearing (through thumb area) Other Position/Activity Restrictions: OT seeking clarification on hand precautions       Mobility Bed Mobility Overal bed mobility: Needs Assistance Bed Mobility: Rolling;Sit to Sidelying Rolling: Min guard Sidelying to sit: Min guard     Sit to sidelying: Min assist General bed mobility comments: Max verbal cues needed for proper technique. Min assist to guide LEs into bed  Transfers Overall transfer level: Needs assistance Equipment used: Right platform walker Transfers: Sit to/from Stand Sit to Stand: Min assist         General transfer comment: Verbal and tactile cues for hand placement, VC for technique. Min A to boost up from chair    Balance Overall  balance assessment: Needs assistance Sitting-balance support: Bilateral upper extremity supported;Feet supported Sitting balance-Leahy Scale: Good     Standing balance support: Bilateral upper extremity supported Standing balance-Leahy Scale: Fair Standing balance comment: R platform RW for support                   ADL                                                Vision                     Perception     Praxis      Cognition   Behavior During Therapy: WFL for tasks assessed/performed Overall Cognitive Status: Within Functional Limits for tasks assessed                       Extremity/Trunk Assessment               Exercises General Exercises - Lower Extremity Ankle Circles/Pumps: AROM;Seated;Both;10 reps Long Arc Quad: Strengthening;Both;10 reps;Seated;Other (comment) (5 sec hold) Hand Exercises Digit Composite Flexion: AROM;10 reps;Right;Seated Digit Composite Abduction: AROM;Right;10 reps;Seated Thumb Abduction: AROM;Right;10 reps;Seated   Shoulder Instructions       General Comments      Pertinent Vitals/ Pain       Pain Assessment: Faces Faces Pain Scale: Hurts little more Pain Location: back Pain Descriptors / Indicators: Aching;Grimacing;Sore Pain Intervention(s): Limited activity within patient's tolerance;Monitored during  session;Repositioned  Home Living                                          Prior Functioning/Environment              Frequency Min 2X/week     Progress Toward Goals  OT Goals(current goals can now be found in the care plan section)  Progress towards OT goals: Progressing toward goals  Acute Rehab OT Goals Patient Stated Goal: to go home once ready OT Goal Formulation: With patient ADL Goals Additional ADL Goal #1: Pt will independently demonstrate ability to complete hand/finger AROM exercises.   Plan Discharge plan needs to be updated;Frequency  remains appropriate    Co-evaluation                 End of Session Equipment Utilized During Treatment: Gait belt;Other (comment) (R platform RW)   Activity Tolerance Patient tolerated treatment well   Patient Left in bed;with call bell/phone within reach;with family/visitor present   Nurse Communication          Time: 2671-2458 OT Time Calculation (min): 22 min  Charges: OT General Charges $OT Visit: 1 Procedure OT Treatments $Therapeutic Exercise: 8-22 mins  Binnie Kand M.S., OTR/L Pager: [phone]  10/29/2015, 12:04 PM

## 2015-10-29 NOTE — Progress Notes (Signed)
CONSULT PROGRESS NOTE  [name] VHQ:[id number] DOB: Mar 30, 1951 DOA: 10/26/2015 PCP: [name], MD  Assessment/Plan: Hypoglycemia in setting of diabetes mellitus.  -Recommend patient keep a log of CBGs at home for PCPs review -does not have a meter that talks to him currently- will try to arrange with diabetic coordinator - take morning and evening insulin at regularly scheduled times  -Patient needs to be consistent with bedtime snacks  -suspect not compliant with diet at home -recent weight loss of about 40 lbs so would not require as much insulin -decrease dose of 70/30 here and monitor -He will need close follow-up with PCP  Code Status: full Family Communication: patient Disposition Plan: per primary    HPI/Subjective: Weakness right side  Objective: Filed Vitals:   10/29/15 0708  BP: 135/58  Pulse: 65  Temp: 98.3 F (36.8 C)  Resp: 16    Intake/Output Summary (Last 24 hours) at 10/29/15 1054 Last data filed at 10/29/15 0730  Gross per 24 hour  Intake    480 ml  Output   1700 ml  Net  -1220 ml   Filed Weights   10/26/15 1059  Weight: 109.68 kg (241 lb 12.8 oz)    Exam:   General:  Awake, NAD   Data Reviewed: Basic Metabolic Panel:  Recent Labs Lab 10/23/15 1525 10/26/15 1503  NA 137 138  K 4.3 3.9  CL 105  --   CO2 24  --   GLUCOSE 98 83  BUN 14  --   CREATININE 1.13  --   CALCIUM 8.9  --    Liver Function Tests:  Recent Labs Lab 10/23/15 1525  AST 21  ALT 16*  ALKPHOS 42  BILITOT 0.8  PROT 6.5  ALBUMIN 3.4*   No results for input(s): LIPASE, AMYLASE in the last 168 hours. No results for input(s): AMMONIA in the last 168 hours. CBC:  Recent Labs Lab 10/23/15 1525 10/26/15 1503 10/27/15 0541 10/27/15 1420  WBC 6.7  --  6.4 7.2  NEUTROABS  --   --   --  5.1  HGB 11.9* 11.6* 9.9* 13.5  HCT 36.6* 34.0* 30.3* 41.1  MCV 70.0*  --  70.5* 68.5*  PLT 232  --  164 156   Cardiac Enzymes: No results for input(s):  CKTOTAL, CKMB, CKMBINDEX, TROPONINI in the last 168 hours. BNP (last 3 results) No results for input(s): BNP in the last 8760 hours.  ProBNP (last 3 results) No results for input(s): PROBNP in the last 8760 hours.  CBG:  Recent Labs Lab 10/28/15 0640 10/28/15 1135 10/28/15 1536 10/28/15 2104 10/29/15 0712  GLUCAP 135* 129* 154* 139* 125*    Recent Results (from the past 240 hour(s))  Surgical pcr screen     Status: Abnormal   Collection Time: 10/23/15  3:24 PM  Result Value Ref Range Status   MRSA, PCR NEGATIVE NEGATIVE Final   Staphylococcus aureus POSITIVE (A) NEGATIVE Final    Comment:        The Xpert SA Assay (FDA approved for NASAL specimens in patients over 66 years of age), is one component of a comprehensive surveillance program.  Test performance has been validated by [facility] for patients greater than or equal to 66 year old. It is not intended to diagnose infection nor to guide or monitor treatment.      Studies: No results found.  Scheduled Meds: . carvedilol  12.5 mg Oral BID WC  . cephALEXin  500 mg Oral Q12H  .  docusate sodium  100 mg Oral BID  . ferrous gluconate  324 mg Oral Q breakfast  . gabapentin  300 mg Oral TID  . insulin aspart  0-20 Units Subcutaneous 6 times per day  . insulin aspart protamine- aspart  25 Units Subcutaneous BID WC  . isosorbide mononitrate  120 mg Oral Daily  . losartan  50 mg Oral Daily  . omega-3 acid ethyl esters  2 capsule Oral BID  . pantoprazole  40 mg Oral Daily  . phenazopyridine  200 mg Oral TID WC  . sodium chloride  3 mL Intravenous Q12H  . tamsulosin  0.4 mg Oral Daily  . traMADol  100 mg Oral 4 times per day  . cyanocobalamin  2,000 mcg Oral Daily   Continuous In-fusions: . sodium chloride    . sodium chloride 10 mL/hr at 10/28/15 1758  . lactated ringers 10 mL/hr at 10/26/15 1146   Antibiotics Given (last 72 hours)    Date/Time Action Medication Dose Rate   10/26/15 1330 Given   ceFAZolin  (ANCEF) IVPB 2 g/50 mL premix 2 g    10/26/15 1710 Given   ceFAZolin (ANCEF) IVPB 2 g/50 mL premix 2 g    10/26/15 2047 Given   ceFAZolin (ANCEF) IVPB 1 g/50 mL premix 1 g 100 mL/hr   10/27/15 0645 Given   ceFAZolin (ANCEF) IVPB 1 g/50 mL premix 1 g 100 mL/hr   10/28/15 1500 Given   cephALEXin (KEFLEX) capsule 500 mg 500 mg    10/28/15 2114 Given   cephALEXin (KEFLEX) capsule 500 mg 500 mg    10/29/15 1194 Given   cephALEXin (KEFLEX) capsule 500 mg 500 mg       Principal Problem:   Spinal stenosis, lumbar region, with neurogenic claudication Active Problems:   Trigger thumb of right hand   Neurogenic claudication due to lumbar spinal stenosis   Postoperative anemia due to acute blood loss   Surgery, elective   Hypoglycemia   Type 2 diabetes mellitus with complication ([place])   Urinary retention with incomplete bladder emptying    Time spent: 15 min    Jaziya Obarr  [facility] Pager [phone]  M-7AM, please contact night-coverage at [email or web address], password [facility] 10/29/2015, 10:54 AM

## 2015-10-29 NOTE — Progress Notes (Signed)
     Subjective: 3 Days Post-Op Procedure(s) (LRB): RIGHT AND CENTRAL LUMBAR LAMINECTOMY L3-4, RIGHT L5-S1 LATERAL RECESS DECOMPRESSION (N/A) RELEASE TRIGGER FINGER RIGHT THUMB (Right) Complains of weakness right leg, noted by  PT yesterday with some right facial weakness.  Preop with right leg foot DF weakness. Tolerating po diet and po narcotics.  Patient reports pain as moderate.    Objective:   VITALS:  Temp:  [98.3 F (36.8 C)-100.7 F (38.2 C)] 98.3 F (36.8 C) (11/07 0708) Pulse Rate:  [65-82] 65 (11/07 0708) Resp:  [16] 16 (11/07 0708) BP: (106-139)/(52-58) 135/58 mmHg (11/07 0708) SpO2:  [95 %-97 %] 97 % (11/07 0708)  ABD soft Intact pulses distally Dorsiflexion/Plantar flexion intact Incision: no drainage and dressing changed. Right UE biceps, triceps, wrist VF and DF weakness. Right LE knee extension and right foot DF and PF weakness.  LABS  Recent Labs  10/26/15 1503 10/27/15 0541 10/27/15 1420  HGB 11.6* 9.9* 13.5  WBC  --  6.4 7.2  PLT  --  164 156    Recent Labs  10/26/15 1503  NA 138  K 3.9  GLUCOSE 83   No results for input(s): LABPT, INR in the last 72 hours.   Assessment/Plan: 3 Days Post-Op Procedure(s) (LRB): RIGHT AND CENTRAL LUMBAR LAMINECTOMY L3-4, RIGHT L5-S1 LATERAL RECESS DECOMPRESSION (N/A) RELEASE TRIGGER FINGER RIGHT THUMB (Right)  Periop acute blood loss anemia, mild. Urinary retention, likely this is medication related, meds adjusted but will need to keep foley for  Minimum of 5 days. Dr. Junious Silk on call discussed case with him.   Up with therapy D/C IV fluids Continue foley due to acute urinary retention  Due to weakness right UE and right LE will ask neurology to evaluate to rule out stroke. Urology will see as an out patient to voiding trial Thurs or Friday this week. Consult Neurology to help sort out the diffuse right hemibody weakness.  NITKA,JAMES E 10/29/2015, 10:15 AM

## 2015-10-29 NOTE — Consult Note (Signed)
Referring Physician: Dr Louanne Skye    Chief Complaint: right sided weakness  HPI:                                                                                                                                         [name] is an 64 y.o. male, right handed, with a past medical history significant for HTN, DM, HLD, chronic congestive heart failure, OSA, legally blind, who underwent uneventful L3, L4 laminectomy, right L5,S1 lateral recess decompression, and release trigger finger right thumb on 10/26/15 and subsequently started noticing " weakness, numbness of the right foot and leg as well as heaviness and numbness of the right hand'. Patient indicated that he probably had weakness of the right arm before coming to the hospital " but it appears to be worse now". Additionally, he reports a numb sensation of the entire right foot as well as inability to move the right foot up and down. Said that the entire right leg is weaker than the left. Denies slurred speech, weakness or numbness involving the right face, vertigo, difficulty swallowing, confusion, or pain in the right upper or lower extremity. Complains of HA since being in the hospital.   Date last known well: unable to determine Time last known well: unable to determine tPA Given: no, unknown LKW, recent surgery   Past Medical History  Diagnosis Date  . Glaucoma   . Ulcer   . HTN (hypertension)   . Hypercholesterolemia   . Diabetes mellitus     Type II  . CHF (congestive heart failure) ([place])   . CAD (coronary artery disease)   . LBP (low back pain)   . Legally blind   . Complication of anesthesia     "lungs filled up with fluid"   . Staph infection     "from back surgery"  . Wears glasses     "to protect cornea"  . Heart murmur   . Sleep apnea   . Trigger finger   . Arthritis     "mild arthritis in hip"    Past Surgical History  Procedure Laterality Date  . Prosthetic cornea placement, right eye  2007    [facility]   . Lumbar fusion    . Peptic ulcer dz surgery  pt was in his 20s    bleeding ulcer.   . Cholecystectomy  09/15/2012    Procedure: LAPAROSCOPIC CHOLECYSTECTOMY WITH INTRAOPERATIVE CHOLANGIOGRAM;  Surgeon: Pedro Earls, MD;  Location: [place];  Service: General;  Laterality: N/A;  . Inguinal hernia repair      right  . Eye surgery Bilateral     cataracts  . Neck surgery      Family History  Problem Relation Age of Onset  . Breast cancer Mother   . Colon cancer Mother   . Colon cancer Other     Elevated Risk for  . Hypertension  Mother   . Hypertension Father   . Hypertension Father   . Diabetes Mother   . Diabetes Sister   . Diabetes Brother    Social History:  reports that he has never smoked. He has never used smokeless tobacco. He reports that he does not drink alcohol or use illicit drugs. Family history: no MS, epilepsy, or brain tumor Allergies:  Allergies  Allergen Reactions  . Furosemide     pancreatitis  . Diamox [Acetazolamide] Itching  . Lisinopril Rash    Medications:                                                                                                                           Scheduled: . carvedilol  12.5 mg Oral BID WC  . cephALEXin  500 mg Oral Q12H  . docusate sodium  100 mg Oral BID  . ferrous gluconate  324 mg Oral Q breakfast  . gabapentin  300 mg Oral TID  . insulin aspart  0-20 Units Subcutaneous 6 times per day  . insulin aspart protamine- aspart  25 Units Subcutaneous BID WC  . isosorbide mononitrate  120 mg Oral Daily  . losartan  50 mg Oral Daily  . omega-3 acid ethyl esters  2 capsule Oral BID  . pantoprazole  40 mg Oral Daily  . phenazopyridine  200 mg Oral TID WC  . sodium chloride  3 mL Intravenous Q12H  . tamsulosin  0.4 mg Oral Daily  . traMADol  100 mg Oral 4 times per day  . cyanocobalamin  2,000 mcg Oral Daily    ROS:                                                                                                                                        History obtained from chart review and the patient  General ROS: negative for - chills, fatigue, fever, night sweats, weight gain or weight loss Psychological ROS: negative for - behavioral disorder, hallucinations, memory difficulties, mood swings or suicidal ideation Ophthalmic ROS: negative for - blurry vision, double vision, eye pain or loss of vision ENT ROS: negative for - epistaxis, nasal discharge, oral lesions, sore throat, tinnitus or vertigo Allergy and Immunology ROS: negative for - hives or itchy/watery eyes Hematological and Lymphatic ROS: negative for - bleeding problems, bruising or swollen lymph nodes Endocrine ROS: negative for -  galactorrhea, hair pattern changes, polydipsia/polyuria or temperature intolerance Respiratory ROS: negative for - cough, hemoptysis, shortness of breath or wheezing Cardiovascular ROS: negative for - chest pain, dyspnea on exertion, edema or irregular heartbeat Gastrointestinal ROS: negative for - abdominal pain, diarrhea, hematemesis, nausea/vomiting or stool incontinence Genito-Urinary ROS: negative for - dysuria, hematuria, incontinence or urinary frequency/urgency Musculoskeletal ROS: negative for - joint swelling Neurological ROS: as noted in HPI Dermatological ROS: negative for rash and skin lesion changes   Physical exam:  Constitutional: well developed, pleasant male in no apparent distress. Blood pressure 135/58, pulse 65, temperature 98.3 F (36.8 C), temperature source Oral, resp. rate 16, height 5' 10.5" (1.791 m), weight 109.68 kg (241 lb 12.8 oz), SpO2 97 %. Eyes: no jaundice or exophthalmos.  Head: normocephalic. Neck: supple, no bruits, no JVD. Cardiac: no murmurs. Lungs: clear. Abdomen: soft, no tender, no mass. Extremities: no edema, clubbing, or cyanosis.  Skin: no rash  Neurologic Examination:                                                                                                       General: NAD Mental Status: Alert, oriented, thought content appropriate.  Speech fluent without evidence of aphasia.  Able to follow 3 step commands without difficulty. Cranial Nerves: II: patient is legally blind. III,IV, VI: ptosis not present, extra-ocular motions intact bilaterally V,VII: smile symmetric, facial light touch sensation normal bilaterally VIII: hearing normal bilaterally IX,X: uvula rises symmetrically XI: bilateral shoulder shrug XII: midline tongue extension without atrophy or fasciculations  Motor: Some weakness right arm proximally. Dorsiflexors, foot evertors and invertors right foot as well as knee extensors right LE are weak Tone and bulk:normal tone throughout; no atrophy noted Sensory: Pinprick and light touch diminished right foot Deep Tendon Reflexes:  1 at the biceps and patella bilaterally. Absent ankle jerks bilaterally.  Plantars: Right: downgoing   Left: downgoing Cerebellar: No tested Gait:  Unable to test due to multiple leads    Results for orders placed or performed during the hospital encounter of 10/26/15 (from the past 48 hour(s))  Glucose, capillary     Status: Abnormal   Collection Time: 10/27/15 12:34 PM  Result Value Ref Range   Glucose-Capillary 108 (H) 65 - 99 mg/dL  CBC with Differential/Platelet     Status: Abnormal   Collection Time: 10/27/15  2:20 PM  Result Value Ref Range   WBC 7.2 4.0 - 10.5 K/uL   RBC 6.00 (H) 4.22 - 5.81 MIL/uL   Hemoglobin 13.5 13.0 - 17.0 g/dL    Comment: DELTA CHECK NOTED RESULT CALLED TO, READ BACK BY AND VERIFIED WITH: SAMUELSSON,RN 10/27/15 1553 WOOTEN,K    HCT 41.1 39.0 - 52.0 %   MCV 68.5 (L) 78.0 - 100.0 fL   MCH 22.5 (L) 26.0 - 34.0 pg   MCHC 32.8 30.0 - 36.0 g/dL   RDW 15.1 11.5 - 15.5 %   Platelets 156 150 - 400 K/uL   Neutrophils Relative % 71 %   Neutro Abs 5.1 1.7 - 7.7 K/uL   Lymphocytes Relative 16 %  Lymphs Abs 1.2 0.7 - 4.0 K/uL   Monocytes Relative 9 %   Monocytes  Absolute 0.7 0.1 - 1.0 K/uL   Eosinophils Relative 3 %   Eosinophils Absolute 0.2 0.0 - 0.7 K/uL   Basophils Relative 0 %   Basophils Absolute 0.0 0.0 - 0.1 K/uL  Glucose, capillary     Status: Abnormal   Collection Time: 10/27/15  4:52 PM  Result Value Ref Range   Glucose-Capillary 134 (H) 65 - 99 mg/dL  Glucose, capillary     Status: Abnormal   Collection Time: 10/27/15 10:37 PM  Result Value Ref Range   Glucose-Capillary 225 (H) 65 - 99 mg/dL  Urinalysis, Routine w reflex microscopic (not at [facility])     Status: Abnormal   Collection Time: 10/27/15 11:03 PM  Result Value Ref Range   Color, Urine AMBER (A) YELLOW    Comment: BIOCHEMICALS MAY BE AFFECTED BY COLOR   APPearance CLEAR CLEAR   Specific Gravity, Urine 1.011 1.005 - 1.030   pH 5.5 5.0 - 8.0   Glucose, UA NEGATIVE NEGATIVE mg/dL   Hgb urine dipstick SMALL (A) NEGATIVE   Bilirubin Urine NEGATIVE NEGATIVE   Ketones, ur 15 (A) NEGATIVE mg/dL   Protein, ur NEGATIVE NEGATIVE mg/dL   Urobilinogen, UA 0.2 0.0 - 1.0 mg/dL   Nitrite NEGATIVE NEGATIVE   Leukocytes, UA NEGATIVE NEGATIVE  Urine microscopic-add on     Status: None   Collection Time: 10/27/15 11:03 PM  Result Value Ref Range   WBC, UA 0-2 <3 WBC/hpf   RBC / HPF 3-6 <3 RBC/hpf   Bacteria, UA RARE RARE  Glucose, capillary     Status: Abnormal   Collection Time: 10/28/15  6:40 AM  Result Value Ref Range   Glucose-Capillary 135 (H) 65 - 99 mg/dL  Glucose, capillary     Status: Abnormal   Collection Time: 10/28/15 11:35 AM  Result Value Ref Range   Glucose-Capillary 129 (H) 65 - 99 mg/dL  Glucose, capillary     Status: Abnormal   Collection Time: 10/28/15  3:36 PM  Result Value Ref Range   Glucose-Capillary 154 (H) 65 - 99 mg/dL  Glucose, capillary     Status: Abnormal   Collection Time: 10/28/15  9:04 PM  Result Value Ref Range   Glucose-Capillary 139 (H) 65 - 99 mg/dL  Glucose, capillary     Status: Abnormal   Collection Time: 10/29/15  7:12 AM  Result  Value Ref Range   Glucose-Capillary 125 (H) 65 - 99 mg/dL   No results found.   Assessment: 64 y.o. male with multiple risk factors for stroke and right arm-leg weakness. The weakness and numbness of the right LE seems to be more confined to the right foot with a pattern suggestive of a peroneal neuropathy. However, a focal neuropathy will not explain why he feels weak in the right UE (although on exam he is weak chiefly proximally). Further, some of the weakness in the right arm was present before admission to the hospital. In any case, due to his multiple risk factors will get MRI brain to ruled out left brain stroke. Will follow up.   Stroke Risk Factors - age, HTN, DM, HLD, chronic congestive heart failure,   Dorian Pod, MD [facility] [phone]  10/29/2015, 10:16 AM

## 2015-10-29 NOTE — Progress Notes (Signed)
Physical Therapy Treatment Patient Details Name: [name] MRN: [id number] DOB: Aug 04, 1951 Today's Date: 10/29/2015    History of Present Illness 64 y.o. s/p RIGHT AND CENTRAL LUMBAR LAMINECTOMY L3-4, RIGHT L5-S1 LATERAL RECESS DECOMPRESSION and RELEASE TRIGGER FINGER RIGHT THUMB. Pt is legally blind and has had previous back surgery.    PT Comments    The pt continues to ambulate well and is able to state back precautions.  Verbal and tactile cues required in bed mobility to actually follow back precautions.  Pt continues to report weakness in his RLE.  Yet, no functional signs of weakness/foot drop when ambulating.     Follow Up Recommendations  Home health PT;Supervision for mobility/OOB     Equipment Recommendations  Rolling walker with 5" wheels;Other (comment) (Rt. platform attachment)       Precautions / Restrictions Precautions Precautions: Fall;Back Precaution Comments: Pt able to recall all back precautions Restrictions Weight Bearing Restrictions: Yes RUE Weight Bearing:  (NWB through thumb area) Other Position/Activity Restrictions: OT seeking clarification on hand precautions    Mobility  Bed Mobility Overal bed mobility: Needs Assistance Bed Mobility: Rolling;Sidelying to Sit Rolling: Min guard Sidelying to sit: Min guard       General bed mobility comments: Min. guard for safety with lines and to slightly assist trunk in sitting up.  Max. verbal cues for proper log rolling and WB restrictions.  Use of hand rails to sit up; HOB slightly elevated  Transfers Overall transfer level: Needs assistance Equipment used: Right platform walker Transfers: Sit to/from Stand Sit to Stand: Min guard         General transfer comment: Tactile cues for Rt hand placement on platform RW.  Min guard for safety and control of RW upon standing.   Ambulation/Gait Ambulation/Gait assistance: Min assist Ambulation Distance (Feet): 80 Feet Assistive device: Right  platform walker       General Gait Details: Tactile and verbal cues for posture and min assist at times directing pt in hallway as he is legally blind.  Platform walker was adjusted due to pt. reporting that it felt like it was too short and he was leaning forward.        Balance Overall balance assessment: Needs assistance Sitting-balance support: Bilateral upper extremity supported;Feet supported Sitting balance-Leahy Scale: Good     Standing balance support: Bilateral upper extremity supported Standing balance-Leahy Scale: Fair                      Cognition Arousal/Alertness: Awake/alert Behavior During Therapy: WFL for tasks assessed/performed Overall Cognitive Status: Within Functional Limits for tasks assessed                      Exercises General Exercises - Lower Extremity Ankle Circles/Pumps: AROM;Seated;Both;10 reps Long Arc Quad: Strengthening;Both;10 reps;Seated;Other (comment) (5 sec hold)                    PT Goals (current goals can now be found in the care plan section) Acute Rehab PT Goals Patient Stated Goal: to go home once ready Progress towards PT goals: Progressing toward goals    Frequency  Min 5X/week    PT Plan Current plan remains appropriate       End of Session Equipment Utilized During Treatment: Gait belt Activity Tolerance: Patient tolerated treatment well Patient left: in chair;with call bell/phone within reach     Time: 0916-0940 PT Time Calculation (min) (ACUTE ONLY): 24 min  Charges:  1 Gait, 1 TE                   G Codes:      Geroge Gilliam Nov 20, 2015, 10:25 AM

## 2015-10-30 DIAGNOSIS — M4806 Spinal stenosis, lumbar region: Secondary | ICD-10-CM | POA: Diagnosis not present

## 2015-10-30 LAB — GLUCOSE, CAPILLARY
Glucose-Capillary: 112 mg/dL — ABNORMAL HIGH (ref 65–99)
Glucose-Capillary: 114 mg/dL — ABNORMAL HIGH (ref 65–99)
Glucose-Capillary: 144 mg/dL — ABNORMAL HIGH (ref 65–99)
Glucose-Capillary: 158 mg/dL — ABNORMAL HIGH (ref 65–99)

## 2015-10-30 MED ORDER — INSULIN ASPART 100 UNIT/ML ~~LOC~~ SOLN: 0.0000 [IU] | SUBCUTANEOUS | Status: DC

## 2015-10-30 MED ORDER — METHOCARBAMOL 750 MG PO TABS: 750.0000 mg | ORAL_TABLET | Freq: Three times a day (TID) | ORAL | Status: DC | PRN

## 2015-10-30 MED ORDER — INSULIN ASPART PROT & ASPART (70-30 MIX) 100 UNIT/ML ~~LOC~~ SUSP: 25.0000 [IU] | Freq: Two times a day (BID) | SUBCUTANEOUS | Status: DC

## 2015-10-30 MED ORDER — PHENAZOPYRIDINE HCL 200 MG PO TABS: 200.0000 mg | ORAL_TABLET | Freq: Three times a day (TID) | ORAL | Status: DC

## 2015-10-30 MED ORDER — PRODIGY VOICE BLOOD GLUCOSE W/DEVICE KIT: PACK | Status: DC

## 2015-10-30 MED ORDER — FERROUS GLUCONATE 324 (38 FE) MG PO TABS: 324.0000 mg | ORAL_TABLET | Freq: Every day | ORAL | Status: DC

## 2015-10-30 MED ORDER — MENTHOL 3 MG MT LOZG
1.0000 | LOZENGE | OROMUCOSAL | Status: DC | PRN
Start: 2015-10-30 — End: 2016-04-09

## 2015-10-30 MED ORDER — POLYETHYLENE GLYCOL 3350 17 G PO PACK: 17.0000 g | PACK | Freq: Every day | ORAL | Status: DC | PRN

## 2015-10-30 MED ORDER — TAMSULOSIN HCL 0.4 MG PO CAPS: 0.4000 mg | ORAL_CAPSULE | Freq: Every day | ORAL | Status: DC

## 2015-10-30 MED ORDER — OXYCODONE-ACETAMINOPHEN 5-325 MG PO TABS: 1.0000 | ORAL_TABLET | ORAL | Status: DC | PRN

## 2015-10-30 MED ORDER — FLEET ENEMA 7-19 GM/118ML RE ENEM: 1.0000 | ENEMA | Freq: Once | RECTAL | Status: DC | PRN

## 2015-10-30 MED ORDER — DOCUSATE SODIUM 100 MG PO CAPS: 100.0000 mg | ORAL_CAPSULE | Freq: Two times a day (BID) | ORAL | Status: DC

## 2015-10-30 MED ORDER — CEPHALEXIN 500 MG PO CAPS: 500.0000 mg | ORAL_CAPSULE | Freq: Two times a day (BID) | ORAL | Status: DC

## 2015-10-30 MED FILL — Thrombin For Soln 20000 Unit: CUTANEOUS | Qty: 1 | Status: AC

## 2015-10-30 NOTE — Progress Notes (Signed)
Subjective: No change  Objective: Current vital signs: BP 138/71 mmHg  Pulse 69  Temp(Src) 98.3 F (36.8 C) (Oral)  Resp 18  Ht 5' 10.5" (1.791 m)  Wt 109.68 kg (241 lb 12.8 oz)  BMI 34.19 kg/m2  SpO2 95% Vital signs in last 24 hours: Temp:  [98 F (36.7 C)-98.3 F (36.8 C)] 98.3 F (36.8 C) (11/08 0426) Pulse Rate:  [69-72] 69 (11/08 0426) Resp:  [16-18] 18 (11/08 0426) BP: (112-138)/(49-71) 138/71 mmHg (11/08 0426) SpO2:  [95 %-97 %] 95 % (11/08 0426)  Intake/Output from previous day: 11/07 0701 - 11/08 0700 In: 720 [P.O.:720] Out: 2550 [Urine:2550] Intake/Output this shift:   Nutritional status: Diet heart healthy/carb modified Room service appropriate?: Yes; Fluid consistency:: Thin   Lab Results: Basic Metabolic Panel:  Recent Labs Lab 10/23/15 1525 10/26/15 1503  NA 137 138  K 4.3 3.9  CL 105  --   CO2 24  --   GLUCOSE 98 83  BUN 14  --   CREATININE 1.13  --   CALCIUM 8.9  --     Liver Function Tests:  Recent Labs Lab 10/23/15 1525  AST 21  ALT 16*  ALKPHOS 42  BILITOT 0.8  PROT 6.5  ALBUMIN 3.4*   No results for input(s): LIPASE, AMYLASE in the last 168 hours. No results for input(s): AMMONIA in the last 168 hours.  CBC:  Recent Labs Lab 10/23/15 1525 10/26/15 1503 10/27/15 0541 10/27/15 1420  WBC 6.7  --  6.4 7.2  NEUTROABS  --   --   --  5.1  HGB 11.9* 11.6* 9.9* 13.5  HCT 36.6* 34.0* 30.3* 41.1  MCV 70.0*  --  70.5* 68.5*  PLT 232  --  164 156    Cardiac Enzymes: No results for input(s): CKTOTAL, CKMB, CKMBINDEX, TROPONINI in the last 168 hours.  Lipid Panel: No results for input(s): CHOL, TRIG, HDL, CHOLHDL, VLDL, LDLCALC in the last 168 hours.  CBG:  Recent Labs Lab 10/29/15 1144 10/29/15 1629 10/29/15 2034 10/30/15 0002 10/30/15 0416  GLUCAP 115* 137* 124* 114* 112*    Microbiology: Results for orders placed or performed during the hospital encounter of 10/23/15  Surgical pcr screen     Status: Abnormal    Collection Time: 10/23/15  3:24 PM  Result Value Ref Range Status   MRSA, PCR NEGATIVE NEGATIVE Final   Staphylococcus aureus POSITIVE (A) NEGATIVE Final    Comment:        The Xpert SA Assay (FDA approved for NASAL specimens in patients over 26 years of age), is one component of a comprehensive surveillance program.  Test performance has been validated by [facility] for patients greater than or equal to 73 year old. It is not intended to diagnose infection nor to guide or monitor treatment.     Coagulation Studies: No results for input(s): LABPROT, INR in the last 72 hours.  Imaging: Mr Brain Wo Contrast  10/29/2015  CLINICAL DATA:  Right upper and lower extremity weakness. Recent lumbar laminectomies and trigger finger release on 10/26/2015. EXAM: MRI HEAD WITHOUT CONTRAST TECHNIQUE: Multiplanar, multiecho pulse sequences of the brain and surrounding structures were obtained without intravenous contrast. COMPARISON:  Head CT 01/09/2011 FINDINGS: There is no evidence of acute infarct, intracranial hemorrhage, mass, midline shift, or extra-axial fluid collection. Ventricles and sulci are within normal limits for age. Minimal periventricular white matter T2 hyperintensity is nonspecific but compatible with minimal chronic small vessel ischemic disease. The left orbit is unremarkable.  Postoperative changes are noted to the right globe with associated susceptibility artifact. The right globe appears small. Depression of the right lamina papyracea is unchanged and may reflect an old orbital blowout fracture. No significant inflammatory disease is identified in the paranasal sinuses or mastoid air cells. Major intracranial vascular flow voids are preserved. IMPRESSION: 1. No acute intracranial abnormality. 2. Minimal chronic small vessel ischemic disease. Electronically Signed   By: Logan Bores M.D.   On: 10/29/2015 17:55    Medications:  Scheduled: . carvedilol  12.5 mg Oral BID WC  .  cephALEXin  500 mg Oral Q12H  . docusate sodium  100 mg Oral BID  . ferrous gluconate  324 mg Oral Q breakfast  . gabapentin  300 mg Oral TID  . insulin aspart  0-20 Units Subcutaneous 6 times per day  . insulin aspart protamine- aspart  25 Units Subcutaneous BID WC  . isosorbide mononitrate  120 mg Oral Daily  . losartan  50 mg Oral Daily  . omega-3 acid ethyl esters  2 capsule Oral BID  . pantoprazole  40 mg Oral Daily  . phenazopyridine  200 mg Oral TID WC  . sodium chloride  3 mL Intravenous Q12H  . tamsulosin  0.4 mg Oral Daily  . traMADol  100 mg Oral 4 times per day  . cyanocobalamin  2,000 mcg Oral Daily    Assessment/Plan: MRI reviewed and shows no stroke or central process for weakness. Likely peripheral process such as peroneal neuropathy.  Would recommend patient follow up out patient with neurology in 6 weeks for NCV if no improvement. Recmmend PT.   Neurology will S/O    Etta Quill PA-C [facility] [phone]  10/30/2015, 9:22 AM Patient seen and examined together with physician assistant and I concur with the assessment and plan.  Dorian Pod, MD

## 2015-10-30 NOTE — Progress Notes (Signed)
     Subjective: 4 Days Post-Op Procedure(s) (LRB): RIGHT AND CENTRAL LUMBAR LAMINECTOMY L3-4, RIGHT L5-S1 LATERAL RECESS DECOMPRESSION (N/A) RELEASE TRIGGER FINGER RIGHT THUMB (Right) Awake, alert and oriented x 4. I appreciate neurology and internal medicine inputs. MRI of brain negative for any acute infarction, microvascular changes and orbital  changes that are chronic. Patient reports pain as moderate.    Objective:   VITALS:  Temp:  [98 F (36.7 C)-98.3 F (36.8 C)] 98.3 F (36.8 C) (11/08 0426) Pulse Rate:  [69-72] 69 (11/08 0426) Resp:  [16-18] 18 (11/08 0426) BP: (112-138)/(49-71) 138/71 mmHg (11/08 0426) SpO2:  [95 %-97 %] 95 % (11/08 0426)  ABD soft Intact pulses distally Dorsiflexion/Plantar flexion intact Incision: dressing C/D/I No cellulitis present   LABS  Recent Labs  10/27/15 1420  HGB 13.5  WBC 7.2  PLT 156   No results for input(s): NA, K, CL, CO2, BUN, CREATININE, GLUCOSE in the last 72 hours. No results for input(s): LABPT, INR in the last 72 hours.   Assessment/Plan: 4 Days Post-Op Procedure(s) (LRB): RIGHT AND CENTRAL LUMBAR LAMINECTOMY L3-4, RIGHT L5-S1 LATERAL RECESS DECOMPRESSION (N/A) RELEASE TRIGGER FINGER RIGHT THUMB (Right)  Anemia post surgical Post operative acute urinary retention Right LE weakness, mild right UE weakness, ? Residual of epidural abscess and lumbar disease.  Advance diet Up with therapy Discharge home with home health  Assess later this AM and if he is stable plan for discharge.  NITKA,JAMES E 10/30/2015, 7:46 AM

## 2015-10-30 NOTE — Discharge Summary (Signed)
Physician Discharge Summary      Patient ID: [name] MRN: [id number] DOB/AGE: 66-23-1952 66 y.o.  Admit date: 10/26/2015 Discharge date:   Admission Diagnoses:  Principal Problem:   Spinal stenosis, lumbar region, with neurogenic claudication Active Problems:   Trigger thumb of right hand   Urinary retention with incomplete bladder emptying   Postoperative anemia due to acute blood loss   Neurogenic claudication due to lumbar spinal stenosis   Surgery, elective   Hypoglycemia   Type 2 diabetes mellitus with complication [facility])   Discharge Diagnoses:  Same  Past Medical History  Diagnosis Date  . Glaucoma   . Ulcer   . HTN (hypertension)   . Hypercholesterolemia   . Diabetes mellitus     Type II  . CHF (congestive heart failure) ([place])   . CAD (coronary artery disease)   . LBP (low back pain)   . Legally blind   . Complication of anesthesia     "lungs filled up with fluid"   . Staph infection     "from back surgery"  . Wears glasses     "to protect cornea"  . Heart murmur   . Sleep apnea   . Trigger finger   . Arthritis     "mild arthritis in hip"    Surgeries: Procedure(s): RIGHT AND CENTRAL LUMBAR LAMINECTOMY L3-4, RIGHT L5-S1 LATERAL RECESS DECOMPRESSION RELEASE TRIGGER FINGER RIGHT THUMB on 10/26/2015   Consultants:  [facility], [facility], Dr. Junious Silk, Hospital Neurologist  Dr. Aram Beecham.  Discharged Condition: Improved  Hospital Course: [name] is an 64 y.o. male who was admitted 10/26/2015 with a chief complaint of No chief complaint on file. , and found to have a diagnosis of Spinal stenosis, lumbar region, with neurogenic claudication.  He was brought to the operating room on 10/26/2015 and underwent the above named procedures.    He was given perioperative antibiotics:  Anti-infectives    Start     Dose/Rate Route Frequency Ordered Stop   10/30/15 0000  cephALEXin (KEFLEX) 500 MG capsule     500 mg Oral 2 times daily  10/30/15 1735     10/28/15 1500  cephALEXin (KEFLEX) capsule 500 mg     500 mg Oral Every 12 hours 10/28/15 1459     10/26/15 2200  ceFAZolin (ANCEF) IVPB 1 g/50 mL premix     1 g 100 mL/hr over 30 Minutes Intravenous Every 8 hours 10/26/15 1938 10/27/15 0715   10/26/15 1200  ceFAZolin (ANCEF) IVPB 2 g/50 mL premix     2 g 100 mL/hr over 30 Minutes Intravenous To [facility] 10/25/15 1409 10/26/15 1740    On POD#1 dressing changed, no drainage, foley discontinued and after 4 hours he began experiencing penile discomfort and obvious lower abdomenal discomfort. Bladder scan showed Distention and a foley reinserted with 1000 cc return. In the PACU he had an episode of hypoglycemia, [facility] were consulted and recommended changing insulin 70/30 to 25 units BID due to frequent episodic hypoglycemia attacks. Hemaglobin was decreased at 9.8 and  He was started on ferrous gluconate. POD#2 Urology was consulted and Dr. Junious Silk recommended that the patient keep his foley for at least 5 days and then undergo a voiding trial in The office at [facility] on 11/01/2015. Also recommended keflex 500 qd or BID and flomax 28m q day. PT noted right hemibody weakness. PT progressed. POD#3  Seen on rounds and PT Note reviewed, demonstrated right UE biceps and triceps weakness  as well as right knee extension and right foot dorsiflexion and plantar flexion weakness.  Brain MRI was order and returned showing no acute changes. They recommended out patient EMG and NCV for further Assessment post hospitalization. Did better with therapy. Incision remained dry with out erythrema Or discharge. He has found to be stable on POD#4 and was discharged home to be seen by [facility] for follow up of therapy post hospitalization.   He was given sequential compression devices and early ambulation for DVT prophylaxis.  They benefited maximally from their hospital stay with urinary retention complication.   Recent  vital signs:  Filed Vitals:   10/30/15 1310  BP: 132/65  Pulse: 79  Temp: 98.4 F (36.9 C)  Resp: 18    Recent laboratory studies:  Results for orders placed or performed during the hospital encounter of 10/26/15  Glucose, capillary  Result Value Ref Range   Glucose-Capillary 85 65 - 99 mg/dL  Glucose, capillary  Result Value Ref Range   Glucose-Capillary 73 65 - 99 mg/dL  Glucose, capillary  Result Value Ref Range   Glucose-Capillary 65 65 - 99 mg/dL   Comment 1 Notify RN    Comment 2 Document in Chart   Glucose, capillary  Result Value Ref Range   Glucose-Capillary 131 (H) 65 - 99 mg/dL  Hemoglobin A1c  Result Value Ref Range   Hgb A1c MFr Bld 6.4 (H) 4.8 - 5.6 %   Mean Plasma Glucose 137 mg/dL  CBC  Result Value Ref Range   WBC 6.4 4.0 - 10.5 K/uL   RBC 4.30 4.22 - 5.81 MIL/uL   Hemoglobin 9.9 (L) 13.0 - 17.0 g/dL   HCT 30.3 (L) 39.0 - 52.0 %   MCV 70.5 (L) 78.0 - 100.0 fL   MCH 23.0 (L) 26.0 - 34.0 pg   MCHC 32.7 30.0 - 36.0 g/dL   RDW 14.9 11.5 - 15.5 %   Platelets 164 150 - 400 K/uL  Glucose, capillary  Result Value Ref Range   Glucose-Capillary 106 (H) 65 - 99 mg/dL  Glucose, capillary  Result Value Ref Range   Glucose-Capillary 84 65 - 99 mg/dL  Glucose, capillary  Result Value Ref Range   Glucose-Capillary 108 (H) 65 - 99 mg/dL  CBC with Differential/Platelet  Result Value Ref Range   WBC 7.2 4.0 - 10.5 K/uL   RBC 6.00 (H) 4.22 - 5.81 MIL/uL   Hemoglobin 13.5 13.0 - 17.0 g/dL   HCT 41.1 39.0 - 52.0 %   MCV 68.5 (L) 78.0 - 100.0 fL   MCH 22.5 (L) 26.0 - 34.0 pg   MCHC 32.8 30.0 - 36.0 g/dL   RDW 15.1 11.5 - 15.5 %   Platelets 156 150 - 400 K/uL   Neutrophils Relative % 71 %   Neutro Abs 5.1 1.7 - 7.7 K/uL   Lymphocytes Relative 16 %   Lymphs Abs 1.2 0.7 - 4.0 K/uL   Monocytes Relative 9 %   Monocytes Absolute 0.7 0.1 - 1.0 K/uL   Eosinophils Relative 3 %   Eosinophils Absolute 0.2 0.0 - 0.7 K/uL   Basophils Relative 0 %   Basophils  Absolute 0.0 0.0 - 0.1 K/uL  Glucose, capillary  Result Value Ref Range   Glucose-Capillary 108 (H) 65 - 99 mg/dL  Glucose, capillary  Result Value Ref Range   Glucose-Capillary 102 (H) 65 - 99 mg/dL  Glucose, capillary  Result Value Ref Range   Glucose-Capillary 134 (H) 65 - 99 mg/dL  Urinalysis,  Routine w reflex microscopic (not at [facility])  Result Value Ref Range   Color, Urine AMBER (A) YELLOW   APPearance CLEAR CLEAR   Specific Gravity, Urine 1.011 1.005 - 1.030   pH 5.5 5.0 - 8.0   Glucose, UA NEGATIVE NEGATIVE mg/dL   Hgb urine dipstick SMALL (A) NEGATIVE   Bilirubin Urine NEGATIVE NEGATIVE   Ketones, ur 15 (A) NEGATIVE mg/dL   Protein, ur NEGATIVE NEGATIVE mg/dL   Urobilinogen, UA 0.2 0.0 - 1.0 mg/dL   Nitrite NEGATIVE NEGATIVE   Leukocytes, UA NEGATIVE NEGATIVE  Glucose, capillary  Result Value Ref Range   Glucose-Capillary 225 (H) 65 - 99 mg/dL  Urine microscopic-add on  Result Value Ref Range   WBC, UA 0-2 <3 WBC/hpf   RBC / HPF 3-6 <3 RBC/hpf   Bacteria, UA RARE RARE  Glucose, capillary  Result Value Ref Range   Glucose-Capillary 129 (H) 65 - 99 mg/dL  Glucose, capillary  Result Value Ref Range   Glucose-Capillary 154 (H) 65 - 99 mg/dL  Glucose, capillary  Result Value Ref Range   Glucose-Capillary 135 (H) 65 - 99 mg/dL  Glucose, capillary  Result Value Ref Range   Glucose-Capillary 139 (H) 65 - 99 mg/dL  Glucose, capillary  Result Value Ref Range   Glucose-Capillary 125 (H) 65 - 99 mg/dL  Glucose, capillary  Result Value Ref Range   Glucose-Capillary 115 (H) 65 - 99 mg/dL  Glucose, capillary  Result Value Ref Range   Glucose-Capillary 137 (H) 65 - 99 mg/dL  Glucose, capillary  Result Value Ref Range   Glucose-Capillary 124 (H) 65 - 99 mg/dL  Glucose, capillary  Result Value Ref Range   Glucose-Capillary 114 (H) 65 - 99 mg/dL   Comment 1 Notify RN    Comment 2 Document in Chart   Glucose, capillary  Result Value Ref Range   Glucose-Capillary 112  (H) 65 - 99 mg/dL  Glucose, capillary  Result Value Ref Range   Glucose-Capillary 144 (H) 65 - 99 mg/dL   Comment 1 Notify RN    Comment 2 Document in Chart   Glucose, capillary  Result Value Ref Range   Glucose-Capillary 158 (H) 65 - 99 mg/dL  I-STAT 4, (NA,K, GLUC, HGB,HCT)  Result Value Ref Range   Sodium 138 135 - 145 mmol/L   Potassium 3.9 3.5 - 5.1 mmol/L   Glucose, Bld 83 65 - 99 mg/dL   HCT 34.0 (L) 39.0 - 52.0 %   Hemoglobin 11.6 (L) 13.0 - 17.0 g/dL    Discharge Medications:     Medication List    STOP taking these medications        cyanocobalamin 2000 MCG tablet     omega-3 acid ethyl esters 1 G capsule  Commonly known as:  LOVAZA      TAKE these medications        carvedilol 12.5 MG tablet  Commonly known as:  COREG  Take 1 tablet (12.5 mg total) by mouth 2 (two) times daily with a meal.     cephALEXin 500 MG capsule  Commonly known as:  KEFLEX  Take 1 capsule (500 mg total) by mouth 2 (two) times daily.     docusate sodium 100 MG capsule  Commonly known as:  COLACE  Take 1 capsule (100 mg total) by mouth 2 (two) times daily.     ferrous gluconate 324 MG tablet  Commonly known as:  FERGON  Take 1 tablet (324 mg total) by mouth daily with  breakfast.     gabapentin 300 MG capsule  Commonly known as:  NEURONTIN  TAKE 1 CAPSULE (300 MG TOTAL) BY MOUTH 3 (THREE) TIMES DAILY.     insulin aspart 100 UNIT/ML injection  Commonly known as:  novoLOG  Inject 0-20 Units into the skin every 4 (four) hours.     insulin aspart protamine- aspart (70-30) 100 UNIT/ML injection  Commonly known as:  NOVOLOG MIX 70/30  Inject 0.25 mLs (25 Units total) into the skin 2 (two) times daily with a meal.     isosorbide mononitrate 120 MG 24 hr tablet  Commonly known as:  IMDUR  Take 1 tablet (120 mg total) by mouth daily.     losartan 50 MG tablet  Commonly known as:  COZAAR  Take 1 tablet (50 mg total) by mouth daily.     menthol-cetylpyridinium 3 MG lozenge    Commonly known as:  CEPACOL  Take 1 lozenge (3 mg total) by mouth as needed for sore throat (sore throat).     methocarbamol 750 MG tablet  Commonly known as:  ROBAXIN  Take 1 tablet (750 mg total) by mouth every 8 (eight) hours as needed for muscle spasms.     ONETOUCH VERIO Soln  1 Act by In Vitro route 2 (two) times daily.     oxyCODONE-acetaminophen 5-325 MG tablet  Commonly known as:  PERCOCET/ROXICET  Take 1-2 tablets by mouth every 4 (four) hours as needed for moderate pain.     phenazopyridine 200 MG tablet  Commonly known as:  PYRIDIUM  Take 1 tablet (200 mg total) by mouth 3 (three) times daily with meals.     polyethylene glycol packet  Commonly known as:  MIRALAX / GLYCOLAX  Take 17 g by mouth daily as needed for mild constipation.     PRODIGY VOICE BLOOD GLUCOSE W/DEVICE Kit  Use to check blood sugars twice a day Dx e11.9     sodium phosphate 7-19 GM/118ML Enem  Place 133 mLs (1 enema total) rectally once as needed for severe constipation.     tamsulosin 0.4 MG Caps capsule  Commonly known as:  FLOMAX  Take 1 capsule (0.4 mg total) by mouth daily.     traMADol 50 MG tablet  Commonly known as:  ULTRAM  TAKE 1 TO 2 TABLETS BY MOUTH EVERY 4 TO 6 HOURS AS NEEDED FOR PAIN (INS PAYS FOR 8 TABS PER DAY)      ASK your doctor about these medications        ezetimibe 10 MG tablet  Commonly known as:  ZETIA  Take 1 tablet (10 mg total) by mouth daily.     glucose blood test strip  1 each by Other route 2 (two) times daily. Use to check blood sugars twice a day Dx E11.9     glucose blood test strip  Commonly known as:  ONETOUCH VERIO  Use BID     insulin lispro protamine-lispro (75-25) 100 UNIT/ML Susp injection  Commonly known as:  HUMALOG 75/25 MIX  Inject 45-85 Units into the skin 2 (two) times daily with a meal. Use 85 units every morning and use 45 units every evening     INSULIN SYRINGE 1CC/29G 29G X 1/2" 1 ML Misc  Commonly known as:  B-D INSULIN  SYRINGE  Use as directed with insulin vials     pravastatin 40 MG tablet  Commonly known as:  PRAVACHOL  TAKE 1 TABLET (40 MG TOTAL) BY MOUTH DAILY. DAILY  PRODIGY LANCETS 28G Misc  Use to check blood sugars twice a day Dx e11.9        Diagnostic Studies: Dg Lumbar Spine 2-3 Views  10/26/2015  CLINICAL DATA:  Laminectomy L3-4 and L5-S1. EXAM: LUMBAR SPINE - 2-3 VIEW COMPARISON:  Lumbar MRI 07/12/2015 FINDINGS: Three lateral views of the lumbar spine were obtained in the operating room. Image number 1 reveals a needle posterior to the L3 spinous process. A second needle overlies the S2 spinous process Image number 2 reveals an instrument between the spinous processes of L2 and L3. Second instrument is directed toward the lamina of L5 at the L5-S1 level. Image number 3 reveals a surgical instrument under the lamina of L3 directed at L3-4. Second instrument overlying the canal at the L5-S1 level. IMPRESSION: L3-4 and L5-S1 localized in the operating room. Electronically Signed   By: Franchot Gallo M.D.   On: 10/26/2015 16:08   Mr Brain Wo Contrast  10/29/2015  CLINICAL DATA:  Right upper and lower extremity weakness. Recent lumbar laminectomies and trigger finger release on 10/26/2015. EXAM: MRI HEAD WITHOUT CONTRAST TECHNIQUE: Multiplanar, multiecho pulse sequences of the brain and surrounding structures were obtained without intravenous contrast. COMPARISON:  Head CT 01/09/2011 FINDINGS: There is no evidence of acute infarct, intracranial hemorrhage, mass, midline shift, or extra-axial fluid collection. Ventricles and sulci are within normal limits for age. Minimal periventricular white matter T2 hyperintensity is nonspecific but compatible with minimal chronic small vessel ischemic disease. The left orbit is unremarkable. Postoperative changes are noted to the right globe with associated susceptibility artifact. The right globe appears small. Depression of the right lamina papyracea is unchanged  and may reflect an old orbital blowout fracture. No significant inflammatory disease is identified in the paranasal sinuses or mastoid air cells. Major intracranial vascular flow voids are preserved. IMPRESSION: 1. No acute intracranial abnormality. 2. Minimal chronic small vessel ischemic disease. Electronically Signed   By: Logan Bores M.D.   On: 10/29/2015 17:55    Disposition: 01-Home or Self Care      Discharge Instructions    Call MD / Call 911    Complete by:  As directed   If you experience chest pain or shortness of breath, CALL 911 and be transported to the hospital emergency room.  If you develope a fever above 101 F, pus (white drainage) or increased drainage or redness at the wound, or calf pain, call your surgeon's office.     Constipation Prevention    Complete by:  As directed   Drink plenty of fluids.  Prune juice may be helpful.  You may use a stool softener, such as Colace (over the counter) 100 mg twice a day.  Use MiraLax (over the counter) for constipation as needed.     Diet - low sodium heart healthy    Complete by:  As directed      Discharge instructions    Complete by:  As directed   No lifting greater than 10 lbs. Avoid bending, stooping and twisting. Walk in house for first week them may start to get out slowly increasing distance up to one mile by 3 weeks post op. Keep incision dry for 3 days, may use tegaderm or similar water impervious dressing. Call [facility] for an appointment on 11/10 for a trial removal of foley. Call [facility]opedics for an appointment 2 weeks from now. Call [name] for an appointment for diabetes and medication review in 2 weeks.  Keep dressing dry. Elevated wrist above heart. No ice to the area of the surgery. After three day remove the dressing and apply a bandaid. May wet after 3 days with bandaid in place.  Return to office in ten days for removal of sutures left thumb. Call if any drainage, redness or  worsening swelling.     Driving restrictions    Complete by:  As directed   No driving     Increase activity slowly as tolerated    Complete by:  As directed      Lifting restrictions    Complete by:  As directed   No lifting for 6 weeks           Follow-up Information    Follow up with Tianna Baus E, MD In 2 weeks.   Specialty:  Orthopedic Surgery   Why:  For wound re-check   Contact information:   [place] [address] [phone]       Follow up with [place].   Why:  4-5 days in the morning; call the office    Contact information:   [place] [phone]      Follow up with [name], MD In 1 week.   Specialty:  Internal Medicine   Why:  for recheck of blood sugars.   Contact information:   [address] [place] [address] [phone]        Signed: Jessy Oto 10/30/2015, 5:36 PM

## 2015-10-30 NOTE — Progress Notes (Signed)
Script placed in chart for voice meter.  Patient shoud be d/c'd on lower dose of 70/30- 25 units BID and keep log to bring to PCP [facility] to d/c from diabetic standpoint-- blood sugars: 112-137 Eulogio Bear DO

## 2015-10-30 NOTE — Progress Notes (Signed)
Physical Therapy Treatment Patient Details Name: [name] MRN: [id number] DOB: [birth year] Today's Date: 10/30/2015    History of Present Illness 64 y.o. s/p RIGHT AND CENTRAL LUMBAR LAMINECTOMY L3-4, RIGHT L5-S1 LATERAL RECESS DECOMPRESSION and RELEASE TRIGGER FINGER RIGHT THUMB. Pt is legally blind and has had previous back surgery.    PT Comments    Pt very pleasant and continues to have difficulty with dorsiflexion and plantarflexion bil feet. Pt reports they feel heavy, tight and tingly and when questioned further states he's not really sure when the weakness and difficulty began. Pt educated for transfers, precautions and safety. Encouraged HEP throughout the day.Will continue to follow.   Follow Up Recommendations  Home health PT;Supervision for mobility/OOB     Equipment Recommendations       Recommendations for Other Services       Precautions / Restrictions Precautions Precautions: Fall;Back Restrictions RUE Weight Bearing: Non weight bearing (NWB through thumb)    Mobility  Bed Mobility Overal bed mobility: Needs Assistance Bed Mobility: Rolling;Sidelying to Sit Rolling: Min guard Sidelying to sit: Min guard       General bed mobility comments: mod cues for sequence and precautions with transfers  Transfers Overall transfer level: Needs assistance   Transfers: Sit to/from Stand Sit to Stand: Min guard         General transfer comment: cues for safety, posture and hand placement  Ambulation/Gait Ambulation/Gait assistance: Min assist Ambulation Distance (Feet): 80 Feet Assistive device: Rolling walker (2 wheeled) Gait Pattern/deviations: Step-through pattern;Decreased stride length;Trunk flexed   Gait velocity interpretation: Below normal speed for age/gender General Gait Details: cues for posture and position in RW with assist to steer and direct RW for safety secondary to visual deficits   Stairs            Wheelchair Mobility     Modified Rankin (Stroke Patients Only)       Balance                                    Cognition Arousal/Alertness: Awake/alert Behavior During Therapy: WFL for tasks assessed/performed Overall Cognitive Status: Within Functional Limits for tasks assessed                      Exercises General Exercises - Lower Extremity Long Arc Quad: Strengthening;Both;Seated;AROM;15 reps Hip Flexion/Marching: AROM;Seated;Both;15 reps Toe Raises: AROM;Seated;Both;10 reps Heel Raises: AROM;Seated;Both;10 reps    General Comments        Pertinent Vitals/Pain Pain Assessment: No/denies pain    Home Living                      Prior Function            PT Goals (current goals can now be found in the care plan section) Progress towards PT goals: Progressing toward goals    Frequency       PT Plan Current plan remains appropriate    Co-evaluation             End of Session   Activity Tolerance: Patient tolerated treatment well Patient left: in chair;with call bell/phone within reach;with nursing/sitter in room     Time: 3428-7681 PT Time Calculation (min) (ACUTE ONLY): 24 min  Charges:  $Gait Training: 8-22 mins $Therapeutic Exercise: 8-22 mins  G CodesMelford Aase Nov 20, 2015, 10:26 AM Elwyn Reach, [place]

## 2015-10-30 NOTE — Progress Notes (Signed)
Did well with PT today. Neurology notes Brain MRI is stable. Recommend follow up EMG/NCV of the legs as an out patient. Internal Medicine notes should change to insulin 70/30 25 units BID. Will discharge him home. Needs to follow up with urology in 2 days for voiding trial, will continue with flomax and keflex. HHN for PT at home 3 x a week.

## 2015-11-01 ENCOUNTER — Telehealth: Payer: Self-pay | Admitting: *Deleted

## 2015-11-01 NOTE — Telephone Encounter (Signed)
Tried calling pt to set up hosp TCM appt. No answer x's 5 rings can't leave msg due to vm full...[name]

## 2015-11-02 NOTE — Telephone Encounter (Signed)
Called pt again still no answer LMOM RTC.../lmb 

## 2015-11-05 ENCOUNTER — Encounter: Payer: Self-pay | Admitting: Internal Medicine

## 2015-11-05 NOTE — Telephone Encounter (Signed)
Called pt again still no answer LMOM need to make hosp f/u appt pls call back to schedule...[name]

## 2015-12-11 ENCOUNTER — Telehealth: Payer: Self-pay

## 2015-12-11 NOTE — Telephone Encounter (Signed)
12/26/2015 appt needs to be changed to a 30 min AWV.

## 2015-12-11 NOTE — Telephone Encounter (Signed)
Tried to call pt. Mobile number's vm is full and the other has been disconnected.

## 2015-12-18 ENCOUNTER — Other Ambulatory Visit: Payer: Self-pay | Admitting: Internal Medicine

## 2015-12-26 ENCOUNTER — Ambulatory Visit: Payer: Medicare Other | Admitting: Internal Medicine

## 2015-12-26 ENCOUNTER — Ambulatory Visit: Payer: Medicare Other | Admitting: Podiatry

## 2016-01-14 ENCOUNTER — Other Ambulatory Visit: Payer: Self-pay | Admitting: *Deleted

## 2016-01-14 ENCOUNTER — Other Ambulatory Visit: Payer: Self-pay | Admitting: Cardiology

## 2016-01-14 MED ORDER — LOSARTAN POTASSIUM 50 MG PO TABS: 50.0000 mg | ORAL_TABLET | Freq: Every day | ORAL | Status: DC

## 2016-01-17 ENCOUNTER — Other Ambulatory Visit: Payer: Self-pay | Admitting: Cardiology

## 2016-02-05 ENCOUNTER — Other Ambulatory Visit: Payer: Self-pay | Admitting: Internal Medicine

## 2016-02-24 ENCOUNTER — Other Ambulatory Visit: Payer: Self-pay | Admitting: Cardiology

## 2016-02-25 ENCOUNTER — Other Ambulatory Visit: Payer: Self-pay

## 2016-02-25 MED ORDER — LOSARTAN POTASSIUM 50 MG PO TABS: 50.0000 mg | ORAL_TABLET | Freq: Every day | ORAL | Status: DC

## 2016-03-03 ENCOUNTER — Other Ambulatory Visit: Payer: Self-pay | Admitting: Family Medicine

## 2016-03-03 NOTE — Telephone Encounter (Signed)
Refill denied. Have not seen the patient since 03/2015.

## 2016-03-03 NOTE — Telephone Encounter (Signed)
Refill denied. Pt has not been seen since 03/2015.

## 2016-03-21 ENCOUNTER — Other Ambulatory Visit: Payer: Self-pay

## 2016-03-21 DIAGNOSIS — I5022 Chronic systolic (congestive) heart failure: Secondary | ICD-10-CM

## 2016-03-21 MED ORDER — CARVEDILOL 12.5 MG PO TABS: 12.5000 mg | ORAL_TABLET | Freq: Two times a day (BID) | ORAL | Status: DC

## 2016-03-24 ENCOUNTER — Other Ambulatory Visit: Payer: Self-pay | Admitting: Internal Medicine

## 2016-03-24 ENCOUNTER — Other Ambulatory Visit: Payer: Self-pay | Admitting: Cardiology

## 2016-04-09 ENCOUNTER — Other Ambulatory Visit (INDEPENDENT_AMBULATORY_CARE_PROVIDER_SITE_OTHER): Payer: Medicare Other

## 2016-04-09 ENCOUNTER — Encounter: Payer: Self-pay | Admitting: Internal Medicine

## 2016-04-09 ENCOUNTER — Encounter: Payer: Self-pay | Admitting: Podiatry

## 2016-04-09 ENCOUNTER — Telehealth: Payer: Self-pay | Admitting: Cardiology

## 2016-04-09 ENCOUNTER — Ambulatory Visit (INDEPENDENT_AMBULATORY_CARE_PROVIDER_SITE_OTHER): Payer: Medicare Other | Admitting: Internal Medicine

## 2016-04-09 ENCOUNTER — Ambulatory Visit (INDEPENDENT_AMBULATORY_CARE_PROVIDER_SITE_OTHER): Payer: Medicare Other | Admitting: Podiatry

## 2016-04-09 VITALS — BP 104/60 | HR 79 | Temp 98.7°F | Resp 16 | Ht 70.5 in | Wt 244.0 lb

## 2016-04-09 DIAGNOSIS — D519 Vitamin B12 deficiency anemia, unspecified: Secondary | ICD-10-CM

## 2016-04-09 DIAGNOSIS — N400 Enlarged prostate without lower urinary tract symptoms: Secondary | ICD-10-CM | POA: Diagnosis not present

## 2016-04-09 DIAGNOSIS — E118 Type 2 diabetes mellitus with unspecified complications: Secondary | ICD-10-CM | POA: Diagnosis not present

## 2016-04-09 DIAGNOSIS — I5020 Unspecified systolic (congestive) heart failure: Secondary | ICD-10-CM

## 2016-04-09 DIAGNOSIS — Z0000 Encounter for general adult medical examination without abnormal findings: Secondary | ICD-10-CM | POA: Diagnosis not present

## 2016-04-09 DIAGNOSIS — I2510 Atherosclerotic heart disease of native coronary artery without angina pectoris: Secondary | ICD-10-CM

## 2016-04-09 DIAGNOSIS — B351 Tinea unguium: Secondary | ICD-10-CM

## 2016-04-09 DIAGNOSIS — E7800 Pure hypercholesterolemia, unspecified: Secondary | ICD-10-CM

## 2016-04-09 DIAGNOSIS — Z794 Long term (current) use of insulin: Secondary | ICD-10-CM

## 2016-04-09 DIAGNOSIS — I10 Essential (primary) hypertension: Secondary | ICD-10-CM | POA: Diagnosis not present

## 2016-04-09 DIAGNOSIS — E1151 Type 2 diabetes mellitus with diabetic peripheral angiopathy without gangrene: Secondary | ICD-10-CM

## 2016-04-09 LAB — COMPREHENSIVE METABOLIC PANEL
ALBUMIN: 3.9 g/dL (ref 3.5–5.2)
ALT: 11 U/L (ref 0–53)
AST: 12 U/L (ref 0–37)
Alkaline Phosphatase: 53 U/L (ref 39–117)
BILIRUBIN TOTAL: 0.6 mg/dL (ref 0.2–1.2)
BUN: 14 mg/dL (ref 6–23)
CALCIUM: 9.3 mg/dL (ref 8.4–10.5)
CO2: 30 mEq/L (ref 19–32)
CREATININE: 0.93 mg/dL (ref 0.40–1.50)
Chloride: 102 mEq/L (ref 96–112)
GFR: 104.34 mL/min (ref >60.00)
Glucose, Bld: 125 mg/dL — ABNORMAL HIGH (ref 70–99)
Potassium: 4.2 mEq/L (ref 3.5–5.1)
Sodium: 138 mEq/L (ref 135–145)
Total Protein: 6.9 g/dL (ref 6.0–8.3)

## 2016-04-09 LAB — PSA: PSA: 1.99 ng/mL (ref 0.10–4.00)

## 2016-04-09 LAB — LIPID PANEL
CHOLESTEROL: 171 mg/dL (ref 0–200)
HDL: 28.2 mg/dL — ABNORMAL LOW (ref >39.00)
LDL Cholesterol: 117 mg/dL — ABNORMAL HIGH (ref 0–99)
NonHDL: 142.48
TRIGLYCERIDES: 127 mg/dL (ref 0.0–149.0)
Total CHOL/HDL Ratio: 6
VLDL: 25.4 mg/dL (ref 0.0–40.0)

## 2016-04-09 LAB — CBC WITH DIFFERENTIAL/PLATELET
BASOS ABS: 0 10*3/uL (ref 0.0–0.1)
Basophils Relative: 0.7 % (ref 0.0–3.0)
EOS ABS: 0.3 10*3/uL (ref 0.0–0.7)
Eosinophils Relative: 4.9 % (ref 0.0–5.0)
HEMATOCRIT: 38.3 % — AB (ref 39.0–52.0)
HEMOGLOBIN: 12.4 g/dL — AB (ref 13.0–17.0)
LYMPHS PCT: 29.2 % (ref 12.0–46.0)
Lymphs Abs: 1.5 10*3/uL (ref 0.7–4.0)
MCHC: 32.3 g/dL (ref 30.0–36.0)
MCV: 71.2 fl — ABNORMAL LOW (ref 78.0–100.0)
MONO ABS: 0.5 10*3/uL (ref 0.1–1.0)
Monocytes Relative: 9.1 % (ref 3.0–12.0)
NEUTROS ABS: 2.9 10*3/uL (ref 1.4–7.7)
Neutrophils Relative %: 56.1 % (ref 43.0–77.0)
PLATELETS: 223 10*3/uL (ref 150.0–400.0)
RBC: 5.38 Mil/uL (ref 4.22–5.81)
RDW: 15.2 % (ref 11.5–15.5)
WBC: 5.1 10*3/uL (ref 4.0–10.5)

## 2016-04-09 LAB — URINALYSIS, ROUTINE W REFLEX MICROSCOPIC
Bilirubin Urine: NEGATIVE
HGB URINE DIPSTICK: NEGATIVE
Ketones, ur: NEGATIVE
Leukocytes, UA: NEGATIVE
Nitrite: NEGATIVE
PH: 7 (ref 5.0–8.0)
RBC / HPF: NONE SEEN (ref 0–?)
Specific Gravity, Urine: 1.015 (ref 1.000–1.030)
TOTAL PROTEIN, URINE-UPE24: NEGATIVE
Urine Glucose: NEGATIVE
Urobilinogen, UA: 0.2 (ref 0.0–1.0)
WBC, UA: NONE SEEN (ref 0–?)

## 2016-04-09 LAB — MICROALBUMIN / CREATININE URINE RATIO
Creatinine,U: 129.2 mg/dL
MICROALB/CREAT RATIO: 0.5 mg/g (ref 0.0–30.0)
Microalb, Ur: <0.7 mg/dL (ref 0.0–1.9)

## 2016-04-09 LAB — FECAL OCCULT BLOOD, GUAIAC: FECAL OCCULT BLD: NEGATIVE

## 2016-04-09 LAB — HEMOGLOBIN A1C: HEMOGLOBIN A1C: 6.7 % — AB (ref 4.6–6.5)

## 2016-04-09 LAB — FOLATE: FOLATE: 12.2 ng/mL (ref >5.9)

## 2016-04-09 LAB — VITAMIN B12: VITAMIN B 12: 410 pg/mL (ref 211–911)

## 2016-04-09 LAB — TSH: TSH: 1.5 u[IU]/mL (ref 0.35–4.50)

## 2016-04-09 MED ORDER — PRAVASTATIN SODIUM 40 MG PO TABS: ORAL_TABLET | ORAL | Status: DC

## 2016-04-09 MED ORDER — LOSARTAN POTASSIUM 50 MG PO TABS: 50.0000 mg | ORAL_TABLET | Freq: Every day | ORAL | Status: DC

## 2016-04-09 NOTE — Telephone Encounter (Signed)
New message   *STAT* If patient is at the pharmacy, call can be transferred to refill team.   1. Which medications need to be refilled? (please list name of each medication and dose if known) Losartan 2. Which pharmacy/location (including street and city if local pharmacy) is medication to be sent to? [address] [facility]   3. Do they need a 30 day or 90 day supply? Pt req a call back. Pt states that he has picked up his script they only gave him 15 pills please call back to discuss.

## 2016-04-09 NOTE — Progress Notes (Signed)
Subjective:  Patient ID: [name], male    DOB: 18-May-1951  Age: 65 y.o. MRN: [id number]  CC: Hypertension; Hyperlipidemia; Diabetes; and Annual Exam   HPI [name] presents for a CPX   He tells me that his blood sugars a been well controlled on Janumet. He has had no episodes of visual changes, polyuria, polydipsia, polyphagia. He is not aware of any side effects from the metformin such as abdominal pain, cramping, or diarrhea.  He tells me his blood pressures been well controlled on losartan, carvedilol and nitroglycerin. He has had no recent episodes of chest pain, shortness of breath, edema, palpitations, or fatigue.  Outpatient Prescriptions Prior to Visit  Medication Sig Dispense Refill  . Blood Glucose Calibration (ONETOUCH VERIO) SOLN 1 Act by In Vitro route 2 (two) times daily. 1 each 11  . Blood Glucose Monitoring Suppl (PRODIGY VOICE BLOOD GLUCOSE) W/DEVICE KIT Use to check blood sugars twice a day Dx e11.9 1 kit 0  . carvedilol (COREG) 12.5 MG tablet Take 1 tablet (12.5 mg total) by mouth 2 (two) times daily with a meal. 180 tablet 0  . docusate sodium (COLACE) 100 MG capsule Take 1 capsule (100 mg total) by mouth 2 (two) times daily. 10 capsule 0  . ezetimibe (ZETIA) 10 MG tablet Take 1 tablet (10 mg total) by mouth daily. 90 tablet 3  . ferrous gluconate (FERGON) 324 MG tablet Take 1 tablet (324 mg total) by mouth daily with breakfast. 40 tablet 3  . gabapentin (NEURONTIN) 300 MG capsule TAKE 1 CAPSULE (300 MG TOTAL) BY MOUTH 3 (THREE) TIMES DAILY. 270 capsule 0  . glucose blood (ONETOUCH VERIO) test strip Use BID 100 each 12  . glucose blood test strip 1 each by Other route 2 (two) times daily. Use to check blood sugars twice a day Dx E11.9 100 each 3  . insulin aspart (NOVOLOG) 100 UNIT/ML injection Inject 0-20 Units into the skin every 4 (four) hours. 10 mL 11  . insulin aspart protamine- aspart (NOVOLOG MIX 70/30) (70-30) 100 UNIT/ML injection Inject 0.25 mLs  (25 Units total) into the skin 2 (two) times daily with a meal. 10 mL 11  . insulin lispro protamine-lispro (HUMALOG 75/25 MIX) (75-25) 100 UNIT/ML SUSP injection Inject 45-85 Units into the skin 2 (two) times daily with a meal. Use 85 units every morning and use 45 units every evening 100 mL 11  . INSULIN SYRINGE 1CC/29G (B-D INSULIN SYRINGE) 29G X 1/2" 1 ML MISC Use as directed with insulin vials 100 each 11  . isosorbide mononitrate (IMDUR) 120 MG 24 hr tablet TAKE 1 TABLET (120 MG TOTAL) BY MOUTH DAILY. 15 tablet 0  . JANUMET 50-1000 MG tablet TAKE 1 TABLET BY MOUTH TWICE A DAY WITH A MEAL 180 tablet 1  . methocarbamol (ROBAXIN) 750 MG tablet Take 1 tablet (750 mg total) by mouth every 8 (eight) hours as needed for muscle spasms. 60 tablet 2  . omega-3 acid ethyl esters (LOVAZA) 1 g capsule TAKE 2 CAPSULES (2 G TOTAL) BY MOUTH 2 (TWO) TIMES DAILY. 120 capsule 11  . phenazopyridine (PYRIDIUM) 200 MG tablet Take 1 tablet (200 mg total) by mouth 3 (three) times daily with meals. 10 tablet 0  . polyethylene glycol (MIRALAX / GLYCOLAX) packet Take 17 g by mouth daily as needed for mild constipation. 14 each 0  . PRODIGY LANCETS 28G MISC Use to check blood sugars twice a day Dx e11.9 100 each 3  .  tamsulosin (FLOMAX) 0.4 MG CAPS capsule Take 1 capsule (0.4 mg total) by mouth daily. 15 capsule 1  . traMADol (ULTRAM) 50 MG tablet TAKE 1 TO 2 TABLETS BY MOUTH EVERY 4 TO 6 HOURS AS NEEDED FOR PAIN (INS PAYS FOR 8 TABS PER DAY)  0  . losartan (COZAAR) 50 MG tablet Take 1 tablet (50 mg total) by mouth daily. 15 tablet 0  . pravastatin (PRAVACHOL) 40 MG tablet TAKE 1 TABLET (40 MG TOTAL) BY MOUTH DAILY. DAILY 30 tablet 11  . menthol-cetylpyridinium (CEPACOL) 3 MG lozenge Take 1 lozenge (3 mg total) by mouth as needed for sore throat (sore throat). 100 tablet 12  . sodium phosphate (FLEET) 7-19 GM/118ML ENEM Place 133 mLs (1 enema total) rectally once as needed for severe constipation. 2 enema 0   No  facility-administered medications prior to visit.    ROS Review of Systems  Constitutional: Negative.  Negative for fever, chills, diaphoresis, activity change, appetite change, fatigue and unexpected weight change.  HENT: Negative.   Eyes: Negative.   Respiratory: Negative.  Negative for cough, choking, chest tightness, shortness of breath and stridor.   Cardiovascular: Negative.  Negative for chest pain, palpitations and leg swelling.  Gastrointestinal: Negative.  Negative for nausea, vomiting, abdominal pain, diarrhea, constipation and blood in stool.  Endocrine: Negative.   Genitourinary: Negative.  Negative for dysuria, urgency, hematuria, decreased urine volume and difficulty urinating.  Musculoskeletal: Negative.  Negative for myalgias, back pain, arthralgias and neck pain.  Skin: Negative.  Negative for color change and rash.  Allergic/Immunologic: Negative.   Neurological: Negative.  Negative for dizziness, tremors, syncope, light-headedness, numbness and headaches.  Hematological: Negative.  Negative for adenopathy. Does not bruise/bleed easily.  Psychiatric/Behavioral: Negative.     Objective:  BP 104/60 mmHg  Pulse 79  Temp(Src) 98.7 F (37.1 C) (Oral)  Resp 16  Ht 5' 10.5" (1.791 m)  Wt 244 lb (110.678 kg)  BMI 34.50 kg/m2  SpO2 96%  BP Readings from Last 3 Encounters:  04/09/16 104/60  10/30/15 132/65  10/23/15 105/66    Wt Readings from Last 3 Encounters:  04/09/16 244 lb (110.678 kg)  10/26/15 241 lb 12.8 oz (109.68 kg)  10/23/15 241 lb 12.8 oz (109.68 kg)    Physical Exam  Constitutional: He is oriented to person, place, and time. No distress.  HENT:  Mouth/Throat: Oropharynx is clear and moist. No oropharyngeal exudate.  Eyes: Conjunctivae are normal. Right eye exhibits no discharge. Left eye exhibits no discharge. No scleral icterus.  Neck: Normal range of motion. Neck supple. No JVD present. No tracheal deviation present. No thyromegaly present.    Cardiovascular: Normal rate, regular rhythm, normal heart sounds and intact distal pulses.  Exam reveals no gallop and no friction rub.   No murmur heard. Pulmonary/Chest: Effort normal and breath sounds normal. No stridor. No respiratory distress. He has no wheezes. He has no rales. He exhibits no tenderness.  Abdominal: Soft. Bowel sounds are normal. He exhibits no distension and no mass. There is no tenderness. There is no rebound and no guarding. Hernia confirmed negative in the right inguinal area and confirmed negative in the left inguinal area.  Genitourinary: Rectum normal, testes normal and penis normal. Rectal exam shows no external hemorrhoid, no internal hemorrhoid, no fissure, no mass, no tenderness and anal tone normal. Guaiac negative stool. Prostate is enlarged (1+ smooth symm BPH). Prostate is not tender. Right testis shows no mass, no swelling and no tenderness. Right testis is descended.  Left testis shows no mass, no swelling and no tenderness. Left testis is descended. Uncircumcised. No phimosis, paraphimosis, hypospadias, penile erythema or penile tenderness. No discharge found.  Musculoskeletal: Normal range of motion. He exhibits no edema or tenderness.  Lymphadenopathy:    He has no cervical adenopathy.       Right: No inguinal adenopathy present.       Left: No inguinal adenopathy present.  Neurological: He is oriented to person, place, and time.  Skin: Skin is warm and dry. No rash noted. He is not diaphoretic. No erythema. No pallor.  Psychiatric: He has a normal mood and affect. His behavior is normal. Judgment and thought content normal.  Vitals reviewed.   Lab Results  Component Value Date   WBC 5.1 04/09/2016   HGB 12.4* 04/09/2016   HCT 38.3* 04/09/2016   PLT 223.0 04/09/2016   GLUCOSE 125* 04/09/2016   CHOL 171 04/09/2016   TRIG 127.0 04/09/2016   HDL 28.20* 04/09/2016   LDLDIRECT 82.7 04/04/2013   LDLCALC 117* 04/09/2016   ALT 11 04/09/2016   AST 12  04/09/2016   NA 138 04/09/2016   K 4.2 04/09/2016   CL 102 04/09/2016   CREATININE 0.93 04/09/2016   BUN 14 04/09/2016   CO2 30 04/09/2016   TSH 1.50 04/09/2016   PSA 1.99 04/09/2016   INR 0.95 08/17/2010   HGBA1C 6.7* 04/09/2016   MICROALBUR <0.7 04/09/2016    No results found.  Assessment & Plan:   [name] was seen today for hypertension, hyperlipidemia, diabetes and annual exam.  Diagnoses and all orders for this visit:  Atherosclerosis of native coronary artery of native heart without angina pectoris- he has had no episodes of chest pain or shortness of breath, will continue risk factor modification with aspirin, statin, blood pressure control, and lifestyle modifications. -     Lipid panel; Future -     pravastatin (PRAVACHOL) 40 MG tablet; TAKE 1 TABLET (40 MG TOTAL) BY MOUTH DAILY. DAILY  HYPERTENSION, BENIGN ESSENTIAL- his blood pressures adequately well-controlled, electrolytes and renal function are stable. -     Comprehensive metabolic panel; Future  Type 2 diabetes mellitus with complication, with long-term current use of insulin ([place])- his A1c is 6.7%, blood sugars are well-controlled, will continue Janumet -     Comprehensive metabolic panel; Future -     Hemoglobin A1c; Future -     Microalbumin / creatinine urine ratio; Future  B12 deficiency anemia- her hemoglobin and hematocrit are normal, B12 level is normal. -     CBC with Differential/Platelet; Future -     Vitamin B12; Future -     Folate; Future  Pure hypercholesterolemia- he has achieved his LDL goal is doing well on the statin. -     TSH; Future -     pravastatin (PRAVACHOL) 40 MG tablet; TAKE 1 TABLET (40 MG TOTAL) BY MOUTH DAILY. DAILY  BPH (benign prostatic hyperplasia)- his PSA is normal so not concerned about prostate cancer, he has no signs or symptoms that need to be treated at this time. -     Urinalysis, Routine w reflex microscopic (not at [facility]); Future -     PSA; Future  I have  discontinued Mr. Girouard's menthol-cetylpyridinium and sodium phosphate. I am also having him maintain his gabapentin, PRODIGY LANCETS 28G, glucose blood, insulin lispro protamine-lispro, INSULIN SYRINGE 1CC/29G, ONETOUCH VERIO, glucose blood, ezetimibe, traMADol, PRODIGY VOICE BLOOD GLUCOSE, insulin aspart, docusate sodium, polyethylene glycol, phenazopyridine, methocarbamol, insulin aspart protamine- aspart,  ferrous gluconate, tamsulosin, omega-3 acid ethyl esters, JANUMET, carvedilol, isosorbide mononitrate, B-D INS SYR ULTRAFINE 1CC/30G, and pravastatin.  Meds ordered this encounter  Medications  . B-D INS SYR ULTRAFINE 1CC/30G 30G X 1/2" 1 ML MISC    Sig: USE AS DIRECTED WITH INSULIN VIALS    Refill:  11  . pravastatin (PRAVACHOL) 40 MG tablet    Sig: TAKE 1 TABLET (40 MG TOTAL) BY MOUTH DAILY. DAILY    Dispense:  90 tablet    Refill:  2     Follow-up: Return in about 4 months (around 08/09/2016).  [name], MD

## 2016-04-09 NOTE — Patient Instructions (Signed)

## 2016-04-09 NOTE — Progress Notes (Signed)
Pre visit review using our clinic review tool, if applicable. No additional management support is needed unless otherwise documented below in the visit note. 

## 2016-04-09 NOTE — Patient Instructions (Signed)
Diabetes and Foot Care Diabetes may cause you to have problems because of poor blood supply (circulation) to your feet and legs. This may cause the skin on your feet to become thinner, break easier, and heal more slowly. Your skin may become dry, and the skin may peel and crack. You may also have nerve damage in your legs and feet causing decreased feeling in them. You may not notice minor injuries to your feet that could lead to infections or more serious problems. Taking care of your feet is one of the most important things you can do for yourself.  HOME CARE INSTRUCTIONS  Wear shoes at all times, even in the house. Do not go barefoot. Bare feet are easily injured.  Check your feet daily for blisters, cuts, and redness. If you cannot see the bottom of your feet, use a mirror or ask someone for help.  Wash your feet with warm water (do not use hot water) and mild soap. Then pat your feet and the areas between your toes until they are completely dry. Do not soak your feet as this can dry your skin.  Apply a moisturizing lotion or petroleum jelly (that does not contain alcohol and is unscented) to the skin on your feet and to dry, brittle toenails. Do not apply lotion between your toes.  Trim your toenails straight across. Do not dig under them or around the cuticle. File the edges of your nails with an emery board or nail file.  Do not cut corns or calluses or try to remove them with medicine.  Wear clean socks or stockings every day. Make sure they are not too tight. Do not wear knee-high stockings since they may decrease blood flow to your legs.  Wear shoes that fit properly and have enough cushioning. To break in new shoes, wear them for just a few hours a day. This prevents you from injuring your feet. Always look in your shoes before you put them on to be sure there are no objects inside.  Do not cross your legs. This may decrease the blood flow to your feet.  If you find a minor scrape,  cut, or break in the skin on your feet, keep it and the skin around it clean and dry. These areas may be cleansed with mild soap and water. Do not cleanse the area with peroxide, alcohol, or iodine.  When you remove an adhesive bandage, be sure not to damage the skin around it.  If you have a wound, look at it several times a day to make sure it is healing.  Do not use heating pads or hot water bottles. They may burn your skin. If you have lost feeling in your feet or legs, you may not know it is happening until it is too late.  Make sure your health care provider performs a complete foot exam at least annually or more often if you have foot problems. Report any cuts, sores, or bruises to your health care provider immediately. SEEK MEDICAL CARE IF:   You have an injury that is not healing.  You have cuts or breaks in the skin.  You have an ingrown nail.  You notice redness on your legs or feet.  You feel burning or tingling in your legs or feet.  You have pain or cramps in your legs and feet.  Your legs or feet are numb.  Your feet always feel cold. SEEK IMMEDIATE MEDICAL CARE IF:   There is increasing redness,   swelling, or pain in or around a wound.  There is a red line that goes up your leg.  Pus is coming from a wound.  You develop a fever or as directed by your health care provider.  You notice a bad smell coming from an ulcer or wound.   This information is not intended to replace advice given to you by your health care provider. Make sure you discuss any questions you have with your health care provider.   Document Released: 12/05/2000 Document Revised: 08/10/2013 Document Reviewed: 05/17/2013 Elsevier Interactive Patient Education 2016 [employer]  

## 2016-04-09 NOTE — Telephone Encounter (Signed)
pt needed appt, he has one on May 02 2016, will send rx to cover him until then on the losartan.

## 2016-04-10 NOTE — Assessment & Plan Note (Signed)

## 2016-04-10 NOTE — Progress Notes (Signed)
Patient ID: [name], male   DOB: [birth year], 65 y.o.   MRN: [id number]  Subjective This patient today presents today complaining of thick discolored toenails in the right and left feet that have gradually become more deformed over time. Patient has not been able to adequately trim his toenails and is requesting debridement of his toenails. He does not recall any recent podiatric care. He is a known type II diabetic and denies any history of ulceration or amputation  States he has a pending dropfoot ordered by his physician for right his right lower extremity  Patient's wife present in the treatment room today     Objective:   Physical Exam  Orientated 3  Vascular: DP pulses 0/4 bilaterally PT pulses 0/4 bilaterally Capillary reflex within normal limits  Neurological: Sensation to 10 g monofilament wire intact 2/5 right and 3/5 left Vibratory sensation nonreactive bilaterally Ankle reflexes weakly reactive bilaterally  Dermatological: No open skin lesions noted bilaterally Dry skin bilaterally The toenails are elongated, brittle, discolored, hypertrophic 6-10  Musculoskeletal: No deformities noted bilaterally There is no restriction ankle, subtalar, midtarsal joints bilaterally Dorsi flexion right 3/5 and 5/5 left Plantar flexion right 4/5 right 5/5 left      Assessment & Plan:   Assessment: Diabetic peripheral arterial disease Diabetic peripheral neuropathy Mycotic toenails 6-10 Mild dropfoot deformity right with pending AFO ordered by another physician per patient  Plan: Today I reviewed the results of examination today made patient aware that he had decreased circulation and feeling in his right and left feet.. I discussed generalized diabetic foot care and provided information and after visit summary  The toenails 6-10 were debrided mechanically and electrically without any bleeding  Reappoint 3 months

## 2016-04-11 ENCOUNTER — Other Ambulatory Visit: Payer: Self-pay | Admitting: Cardiology

## 2016-04-25 ENCOUNTER — Emergency Department (HOSPITAL_COMMUNITY)
Admission: EM | Admit: 2016-04-25 | Discharge: 2016-04-25 | Disposition: A | Discharge status: Home / Self Care | Payer: Medicare Other | Attending: Emergency Medicine | Admitting: Emergency Medicine

## 2016-04-25 ENCOUNTER — Encounter (HOSPITAL_COMMUNITY): Payer: Self-pay

## 2016-04-25 ENCOUNTER — Emergency Department (HOSPITAL_COMMUNITY): Payer: Medicare Other

## 2016-04-25 DIAGNOSIS — H548 Legal blindness, as defined in USA: Secondary | ICD-10-CM | POA: Insufficient documentation

## 2016-04-25 DIAGNOSIS — I509 Heart failure, unspecified: Secondary | ICD-10-CM | POA: Diagnosis not present

## 2016-04-25 DIAGNOSIS — M545 Low back pain: Secondary | ICD-10-CM | POA: Diagnosis not present

## 2016-04-25 DIAGNOSIS — R011 Cardiac murmur, unspecified: Secondary | ICD-10-CM | POA: Diagnosis not present

## 2016-04-25 DIAGNOSIS — Z872 Personal history of diseases of the skin and subcutaneous tissue: Secondary | ICD-10-CM | POA: Diagnosis not present

## 2016-04-25 DIAGNOSIS — H409 Unspecified glaucoma: Secondary | ICD-10-CM | POA: Insufficient documentation

## 2016-04-25 DIAGNOSIS — Z794 Long term (current) use of insulin: Secondary | ICD-10-CM | POA: Insufficient documentation

## 2016-04-25 DIAGNOSIS — R52 Pain, unspecified: Secondary | ICD-10-CM

## 2016-04-25 DIAGNOSIS — I2510 Atherosclerotic heart disease of native coronary artery without angina pectoris: Secondary | ICD-10-CM | POA: Diagnosis not present

## 2016-04-25 DIAGNOSIS — Z8619 Personal history of other infectious and parasitic diseases: Secondary | ICD-10-CM | POA: Diagnosis not present

## 2016-04-25 DIAGNOSIS — M1990 Unspecified osteoarthritis, unspecified site: Secondary | ICD-10-CM | POA: Insufficient documentation

## 2016-04-25 DIAGNOSIS — E7800 Pure hypercholesterolemia, unspecified: Secondary | ICD-10-CM | POA: Insufficient documentation

## 2016-04-25 DIAGNOSIS — Z7984 Long term (current) use of oral hypoglycemic drugs: Secondary | ICD-10-CM | POA: Diagnosis not present

## 2016-04-25 DIAGNOSIS — I10 Essential (primary) hypertension: Secondary | ICD-10-CM | POA: Diagnosis not present

## 2016-04-25 DIAGNOSIS — E119 Type 2 diabetes mellitus without complications: Secondary | ICD-10-CM | POA: Insufficient documentation

## 2016-04-25 DIAGNOSIS — Z79899 Other long term (current) drug therapy: Secondary | ICD-10-CM | POA: Diagnosis not present

## 2016-04-25 MED ORDER — OXYCODONE-ACETAMINOPHEN 5-325 MG PO TABS: 2.0000 | ORAL_TABLET | ORAL | Status: DC | PRN

## 2016-04-25 MED ORDER — OXYCODONE-ACETAMINOPHEN 5-325 MG PO TABS
1.0000 | ORAL_TABLET | Freq: Once | ORAL | Status: AC
  Administered 2016-04-25: 1 via ORAL
  Filled 2016-04-25: qty 1

## 2016-04-25 MED ORDER — METHOCARBAMOL 500 MG PO TABS: 500.0000 mg | ORAL_TABLET | Freq: Two times a day (BID) | ORAL | Status: DC

## 2016-04-25 NOTE — ED Provider Notes (Signed)
CSN: [id number]     Arrival date & time 04/25/16  1456 History  By signing my name below, I, [name], attest that this documentation has been prepared under the direction and in the presence of Harlene Ramus, PA-C Electronically Signed: Ladene Artist, ED Scribe 04/25/2016 at 3:51 PM.   Chief Complaint  Patient presents with  . Leg Pain   The history is provided by the patient. No language interpreter was used.   HPI Comments: [name] is a 65 y.o. male, with a h/o chronic low back pain, HTN, CAD, DM, CHF, legally blind who presents to the Emergency Department complaining of waxing and waning right hip pain onset 2 months ago, worsening over the past 4 days. Pt describes pain as 10/10 sharp, throbbing sensation that is exacerbated with ambulating and radiates into his toes. He states that pain feels similar to pain he experienced in the past prior to lumbar laminectomy in November 2016. Pt reports twisting injury 2 months ago but denies recent falls. He has tried tramadol and gabapentin without significant relief at home. Pt denies fever, abdominal pain, difficulty urinating, bladder/bowel incontinence, saddle anesthesia, new numbness/tingling in lower extremities, new weakness in lower extremities, hx of cancer, IVDU, recent spinal manipulation.   Past Medical History  Diagnosis Date  . Glaucoma   . Ulcer   . HTN (hypertension)   . Hypercholesterolemia   . Diabetes mellitus     Type II  . CHF (congestive heart failure) ([place])   . CAD (coronary artery disease)   . LBP (low back pain)   . Legally blind   . Complication of anesthesia     "lungs filled up with fluid"   . Staph infection     "from back surgery"  . Wears glasses     "to protect cornea"  . Heart murmur   . Sleep apnea   . Trigger finger   . Arthritis     "mild arthritis in hip"   Past Surgical History  Procedure Laterality Date  . Prosthetic cornea placement, right eye  2007    [facility]  . Lumbar fusion     . Peptic ulcer dz surgery  pt was in his 20s    bleeding ulcer.   . Cholecystectomy  09/15/2012    Procedure: LAPAROSCOPIC CHOLECYSTECTOMY WITH INTRAOPERATIVE CHOLANGIOGRAM;  Surgeon: Pedro Earls, MD;  Location: [place];  Service: General;  Laterality: N/A;  . Inguinal hernia repair      right  . Eye surgery Bilateral     cataracts  . Neck surgery    . Lumbar laminectomy/decompression microdiscectomy N/A 10/26/2015    Procedure: RIGHT AND CENTRAL LUMBAR LAMINECTOMY L3-4, RIGHT L5-S1 LATERAL RECESS DECOMPRESSION;  Surgeon: Jessy Oto, MD;  Location: [place];  Service: Orthopedics;  Laterality: N/A;  . Trigger finger release Right 10/26/2015    Procedure: RELEASE TRIGGER FINGER RIGHT THUMB;  Surgeon: Jessy Oto, MD;  Location: [place];  Service: Orthopedics;  Laterality: Right;   Family History  Problem Relation Age of Onset  . Breast cancer Mother   . Colon cancer Mother   . Colon cancer Other     Elevated Risk for  . Hypertension Mother   . Hypertension Father   . Hypertension Father   . Diabetes Mother   . Diabetes Sister   . Diabetes Brother    Social History  Substance Use Topics  . Smoking status: Never Smoker   . Smokeless tobacco: Never Used  .  Alcohol Use: No    Review of Systems  Constitutional: Negative for fever.  Gastrointestinal: Negative for abdominal pain.  Genitourinary: Negative for difficulty urinating.  Musculoskeletal: Positive for back pain and arthralgias.  Neurological: Negative for weakness and numbness.   Allergies  Furosemide; Diamox; and Lisinopril  Home Medications   Prior to Admission medications   Medication Sig Start Date End Date Taking? Authorizing Provider  B-D INS SYR ULTRAFINE 1CC/30G 30G X 1/2" 1 ML MISC USE AS DIRECTED WITH INSULIN VIALS 03/25/16   Historical Provider, MD  Blood Glucose Calibration (ONETOUCH VERIO) SOLN 1 Act by In Vitro route 2 (two) times daily. 08/22/15   [name], MD  Blood Glucose Monitoring Suppl  (PRODIGY VOICE BLOOD GLUCOSE) W/DEVICE KIT Use to check blood sugars twice a day Dx e11.9 10/30/15   Geradine Girt, DO  carvedilol (COREG) 12.5 MG tablet Take 1 tablet (12.5 mg total) by mouth 2 (two) times daily with a meal. 03/21/16   [name], MD  docusate sodium (COLACE) 100 MG capsule Take 1 capsule (100 mg total) by mouth 2 (two) times daily. 10/30/15   Jessy Oto, MD  ezetimibe (ZETIA) 10 MG tablet Take 1 tablet (10 mg total) by mouth daily. 08/22/15   [name], MD  ferrous gluconate (FERGON) 324 MG tablet Take 1 tablet (324 mg total) by mouth daily with breakfast. 10/30/15   Jessy Oto, MD  gabapentin (NEURONTIN) 300 MG capsule TAKE 1 CAPSULE (300 MG TOTAL) BY MOUTH 3 (THREE) TIMES DAILY. 07/23/15   Lyndal Pulley, DO  glucose blood [facility] VERIO) test strip Use BID 08/22/15   [name], MD  glucose blood test strip 1 each by Other route 2 (two) times daily. Use to check blood sugars twice a day Dx E11.9 08/08/15   [name], MD  insulin aspart (NOVOLOG) 100 UNIT/ML injection Inject 0-20 Units into the skin every 4 (four) hours. 10/30/15   Jessy Oto, MD  insulin aspart protamine- aspart (NOVOLOG MIX 70/30) (70-30) 100 UNIT/ML injection Inject 0.25 mLs (25 Units total) into the skin 2 (two) times daily with a meal. 10/30/15   Jessy Oto, MD  insulin lispro protamine-lispro (HUMALOG 75/25 MIX) (75-25) 100 UNIT/ML SUSP injection Inject 45-85 Units into the skin 2 (two) times daily with a meal. Use 85 units every morning and use 45 units every evening 08/21/15   [name], MD  INSULIN SYRINGE 1CC/29G (B-D INSULIN SYRINGE) 29G X 1/2" 1 ML MISC Use as directed with insulin vials 08/21/15   [name], MD  isosorbide mononitrate (IMDUR) 120 MG 24 hr tablet TAKE 1 TABLET (120 MG TOTAL) BY MOUTH DAILY. 04/11/16   Jerline Pain, MD  JANUMET 50-1000 MG tablet TAKE 1 TABLET BY MOUTH TWICE A DAY WITH A MEAL 02/05/16   [name], MD  losartan (COZAAR) 50 MG tablet Take  1 tablet (50 mg total) by mouth daily. 04/09/16   Jerline Pain, MD  methocarbamol (ROBAXIN) 750 MG tablet Take 1 tablet (750 mg total) by mouth every 8 (eight) hours as needed for muscle spasms. 10/30/15   Jessy Oto, MD  omega-3 acid ethyl esters (LOVAZA) 1 g capsule TAKE 2 CAPSULES (2 G TOTAL) BY MOUTH 2 (TWO) TIMES DAILY. 12/19/15   [name], MD  phenazopyridine (PYRIDIUM) 200 MG tablet Take 1 tablet (200 mg total) by mouth 3 (three) times daily with meals. 10/30/15   Jeneen Rinks  Howell Pringle, MD  polyethylene glycol (MIRALAX / GLYCOLAX) packet Take 17 g by mouth daily as needed for mild constipation. 10/30/15   Jessy Oto, MD  pravastatin (PRAVACHOL) 40 MG tablet TAKE 1 TABLET (40 MG TOTAL) BY MOUTH DAILY. DAILY 04/09/16   [name], MD  PRODIGY LANCETS 28G MISC Use to check blood sugars twice a day Dx e11.9 08/08/15   [name], MD  tamsulosin (FLOMAX) 0.4 MG CAPS capsule Take 1 capsule (0.4 mg total) by mouth daily. 10/30/15   Jessy Oto, MD  traMADol (ULTRAM) 50 MG tablet TAKE 1 TO 2 TABLETS BY MOUTH EVERY 4 TO 6 HOURS AS NEEDED FOR PAIN (INS PAYS FOR 8 TABS PER DAY) 08/08/15   Historical Provider, MD   BP 129/86 mmHg  Pulse 110  Temp(Src) 98.2 F (36.8 C) (Oral)  Resp 22  SpO2 99% Physical Exam  Constitutional: He is oriented to person, place, and time. He appears well-developed and well-nourished. No distress.  HENT:  Head: Normocephalic and atraumatic.  Eyes: Conjunctivae are normal. Pupils are equal, round, and reactive to light.  Neck: Neck supple.  Cardiovascular: Normal rate.   HR 92  Pulmonary/Chest: Effort normal.  Abdominal: Soft. He exhibits no distension. There is no tenderness.  Musculoskeletal: Normal range of motion.  No midline C, T tenderness. Midline and R lumbar paraspinal muscles tenderness to palpation. tenderness to palpation over R buttocks and R lateal hip. No obvious hip deformity. no pelvis instability. FROM of R hip, knee and ankle. Decreased  strength of R leg due to reported pain. Sensation grossly intact. 2+ DP pulse. Decreased ROM of back due to reported pain. Pt able to stand an ambulate with cane, which he uses at home due to being legally blind.  Neurological: He is alert and oriented to person, place, and time. He has normal reflexes.  Skin: Skin is warm and dry.  Psychiatric: He has a normal mood and affect. His behavior is normal.  Nursing note and vitals reviewed.  ED Course  Procedures (including critical care time) DIAGNOSTIC STUDIES: Oxygen Saturation is 99% on RA, normal by my interpretation.    COORDINATION OF CARE: 3:44 PM-Discussed treatment plan which includes XR with pt at bedside and pt agreed to plan.   Labs Review Labs Reviewed - No data to display  Imaging Review No results found. I have personally reviewed and evaluated these images and lab results as part of my medical decision-making.   EKG Interpretation None      MDM   Final diagnoses:  None   Pt presents with right lower back pain consistent with back pain flares he has had in the past. Recent lumbar laminectomy performed in November 2016. Pt endorses stumbling and straining his back/right hip 2 months ago with initial onset of sxs. No back pain red flags. VVS, HR 92. Exam revealed TTP of lumbar midline and right lumbar paraspinal muscles, dec strength of right leg due to reported pain, dec ROM of back due to reported pain, BLE otherwise neurovascularly intact. Will order lumbar spine and right hip/pelvis xray for further evaluation due to hx of back injuries and reported recent injury at onset of sxs. Pt given pain meds in the ED.   On reevaluation, pt reports his pain has improved. Pt able to stand and ambulate with his cane, which he uses at baseline due to being legally blind. Lumbar spine xray showed no acute fx of subluxation, mild disc flattening with anterior spurring  at L3-L4, degenerative changes at L3 and L5 and bony fusion at  L4-L5. Right hip and pelvis xray showed mild degenerative changes of bilateral hips, no acute fx or subluxation. I suspect pt's pain is likely due to his chronic back pain associated with his degenerative changes. I do not suspect cauda equina syndrome at this time. Discussed results and plan for d/c with pt. Plan to d/c pt home with pain meds and symptomatic tx. Advised pt to follow up with his PCP and spinal surgeon regarding further pain management of his chronic back pain. Discussed strict return precautions with pt.   I personally performed the services described in this documentation, which was scribed in my presence. The recorded information has been reviewed and is accurate.    Chesley Noon [place], [place] 04/26/16 2820  Tanna Furry, MD 04/28/16 [phone]

## 2016-04-25 NOTE — ED Notes (Signed)
Agree with PA assessment

## 2016-04-25 NOTE — ED Notes (Signed)
Pt here with c/o right hip pain that radiates down his entire right leg to his toes, flare up started earlier this week. Hx of back surgery for a pinched surgery in November and was told he has arthritis in his right hip. Pt states it is too painful to walk on.

## 2016-04-25 NOTE — Discharge Instructions (Signed)
Take your medications as prescribed as needed for pain relief. I also recommend applying ice and/or heat to back for 15-20 minutes 3-4 times daily for pain relief.  I recommend following up with your primary care provider or spine doctor within the next week for further management of your chronic back pain. Please return to the Emergency Department if symptoms worsen or new onset of fever, numbness, tingling, groin numbness, urinary retention, abdominal pain, loss of bowel or bladder, weakness.

## 2016-05-02 ENCOUNTER — Ambulatory Visit: Payer: Medicare Other | Admitting: Physician Assistant

## 2016-05-08 ENCOUNTER — Other Ambulatory Visit: Payer: Self-pay | Admitting: Cardiology

## 2016-05-22 ENCOUNTER — Ambulatory Visit (INDEPENDENT_AMBULATORY_CARE_PROVIDER_SITE_OTHER): Payer: Medicare Other | Admitting: Physician Assistant

## 2016-05-22 ENCOUNTER — Encounter: Payer: Self-pay | Admitting: Physician Assistant

## 2016-05-22 DIAGNOSIS — E7800 Pure hypercholesterolemia, unspecified: Secondary | ICD-10-CM | POA: Diagnosis not present

## 2016-05-22 DIAGNOSIS — I10 Essential (primary) hypertension: Secondary | ICD-10-CM | POA: Diagnosis not present

## 2016-05-22 DIAGNOSIS — E118 Type 2 diabetes mellitus with unspecified complications: Secondary | ICD-10-CM

## 2016-05-22 DIAGNOSIS — Z794 Long term (current) use of insulin: Secondary | ICD-10-CM

## 2016-05-22 DIAGNOSIS — I25119 Atherosclerotic heart disease of native coronary artery with unspecified angina pectoris: Secondary | ICD-10-CM

## 2016-05-22 MED ORDER — ISOSORBIDE MONONITRATE ER 120 MG PO TB24: ORAL_TABLET | ORAL | Status: DC

## 2016-05-22 MED ORDER — LOSARTAN POTASSIUM 50 MG PO TABS: 50.0000 mg | ORAL_TABLET | Freq: Every day | ORAL | Status: DC

## 2016-05-22 MED ORDER — PRAVASTATIN SODIUM 40 MG PO TABS: ORAL_TABLET | ORAL | Status: DC

## 2016-05-22 MED ORDER — ASPIRIN EC 81 MG PO TBEC: 81.0000 mg | DELAYED_RELEASE_TABLET | Freq: Every day | ORAL | Status: DC

## 2016-05-22 NOTE — Addendum Note (Signed)
Addended by: Michae Kava on: 05/22/2016 04:32 PM   Modules accepted: Orders

## 2016-05-22 NOTE — Progress Notes (Signed)
Cardiology Office Note:    Date:  05/22/2016   ID:  [name], DOB 08/27/1951, MRN [id number]  PCP:  Elijah Jones, MD  Cardiologist:  Dr. Mark [name]   Electrophysiologist:  n/a  Referring MD: [name], Elijah L, MD   Chief Complaint  Patient presents with  . Coronary Artery Disease    follow up    History of Present Illness:     [name] is a 65 y.o. male with a hx of CAD status post prior stenting to the OM and LCx, diabetes, HTN, HL, sleep apnea, blindness, obesity. Last intervention was done in 11/08 with BMS to the proximal LCx. He has residual high-grade proximal RCA stenosis that has been treated medically. Last Myoview 9/14 was low risk with no ischemia. Last seen by Dr. Skains 1/16.  Here for follow-up. Here with his wife today. He has had significant issues with back and leg pain. He sees his orthopedist later today. He denies chest pain. Denies significant dyspnea. Denies syncope. Denies orthopnea, PND or significant edema.  Past Medical History  Diagnosis Date  . Glaucoma   . Ulcer   . HTN (hypertension)   . Hypercholesterolemia   . Diabetes mellitus     Type II  . CHF (congestive heart failure) ([facility])   . CAD (coronary artery disease)   . LBP (low back pain)   . Legally blind   . Complication of anesthesia     "lungs filled up with fluid"   . Staph infection     "from back surgery"  . Wears glasses     "to protect cornea"  . Heart murmur   . Sleep apnea   . Trigger finger   . Arthritis     "mild arthritis in hip"    Past Surgical History  Procedure Laterality Date  . Prosthetic cornea placement, right eye  2007    [facility]  . Lumbar fusion    . Peptic ulcer dz surgery  pt was in his 20s    bleeding ulcer.   . Cholecystectomy  09/15/2012    Procedure: LAPAROSCOPIC CHOLECYSTECTOMY WITH INTRAOPERATIVE CHOLANGIOGRAM;  Surgeon: Matthew B Martin, MD;  Location: [facility];  Service: General;  Laterality: N/A;  . Inguinal hernia repair      right    . Eye surgery Bilateral     cataracts  . Neck surgery    . Lumbar laminectomy/decompression microdiscectomy N/A 10/26/2015    Procedure: RIGHT AND CENTRAL LUMBAR LAMINECTOMY L3-4, RIGHT L5-S1 LATERAL RECESS DECOMPRESSION;  Surgeon: James E Nitka, MD;  Location: [facility];  Service: Orthopedics;  Laterality: N/A;  . Trigger finger release Right 10/26/2015    Procedure: RELEASE TRIGGER FINGER RIGHT THUMB;  Surgeon: James E Nitka, MD;  Location: [facility];  Service: Orthopedics;  Laterality: Right;    Current Medications: Outpatient Prescriptions Prior to Visit  Medication Sig Dispense Refill  . B-D INS SYR ULTRAFINE 1CC/30G 30G X 1/2" 1 ML MISC USE AS DIRECTED WITH INSULIN VIALS  11  . Blood Glucose Calibration (ONETOUCH VERIO) SOLN 1 Act by In Vitro route 2 (two) times daily. 1 each 11  . Blood Glucose Monitoring Suppl (PRODIGY VOICE BLOOD GLUCOSE) W/DEVICE KIT Use to check blood sugars twice a day Dx e11.9 1 kit 0  . carvedilol (COREG) 12.5 MG tablet Take 1 tablet (12.5 mg total) by mouth 2 (two) times daily with a meal. 180 tablet 0  . ezetimibe (ZETIA) 10 MG tablet Take 1 tablet (10   mg total) by mouth daily. 90 tablet 3  . gabapentin (NEURONTIN) 300 MG capsule TAKE 1 CAPSULE (300 MG TOTAL) BY MOUTH 3 (THREE) TIMES DAILY. 270 capsule 0  . glucose blood (ONETOUCH VERIO) test strip Use BID 100 each 12  . glucose blood test strip 1 each by Other route 2 (two) times daily. Use to check blood sugars twice a day Dx E11.9 100 each 3  . insulin lispro protamine-lispro (HUMALOG 75/25 MIX) (75-25) 100 UNIT/ML SUSP injection Inject 45-85 Units into the skin 2 (two) times daily with a meal. Use 85 units every morning and use 45 units every evening 100 mL 11  . INSULIN SYRINGE 1CC/29G (B-D INSULIN SYRINGE) 29G X 1/2" 1 ML MISC Use as directed with insulin vials 100 each 11  . JANUMET 50-1000 MG tablet TAKE 1 TABLET BY MOUTH TWICE A DAY WITH A MEAL 180 tablet 1  . omega-3 acid ethyl esters (LOVAZA) 1 g capsule  TAKE 2 CAPSULES (2 G TOTAL) BY MOUTH 2 (TWO) TIMES DAILY. 120 capsule 11  . oxyCODONE-acetaminophen (PERCOCET/ROXICET) 5-325 MG tablet Take 2 tablets by mouth every 4 (four) hours as needed for severe pain. 15 tablet 0  . polyethylene glycol (MIRALAX / GLYCOLAX) packet Take 17 g by mouth daily as needed for mild constipation. 14 each 0  . PRODIGY LANCETS 28G MISC Use to check blood sugars twice a day Dx e11.9 100 each 3  . tamsulosin (FLOMAX) 0.4 MG CAPS capsule Take 1 capsule (0.4 mg total) by mouth daily. 15 capsule 1  . traMADol (ULTRAM) 50 MG tablet TAKE 1 TO 2 TABLETS BY MOUTH EVERY 4 TO 6 HOURS AS NEEDED FOR PAIN (INS PAYS FOR 8 TABS PER DAY)  0  . isosorbide mononitrate (IMDUR) 120 MG 24 hr tablet TAKE 1 TABLET (120 MG TOTAL) BY MOUTH DAILY. 15 tablet 1  . losartan (COZAAR) 50 MG tablet Take 1 tablet (50 mg total) by mouth daily. Patient needs to keep 05/22/16 appointment for further refills 15 tablet 0  . pravastatin (PRAVACHOL) 40 MG tablet TAKE 1 TABLET (40 MG TOTAL) BY MOUTH DAILY. DAILY 90 tablet 2  . docusate sodium (COLACE) 100 MG capsule Take 1 capsule (100 mg total) by mouth 2 (two) times daily. (Patient not taking: Reported on 05/22/2016) 10 capsule 0  . ferrous gluconate (FERGON) 324 MG tablet Take 1 tablet (324 mg total) by mouth daily with breakfast. (Patient not taking: Reported on 05/22/2016) 40 tablet 3  . insulin aspart (NOVOLOG) 100 UNIT/ML injection Inject 0-20 Units into the skin every 4 (four) hours. (Patient not taking: Reported on 05/22/2016) 10 mL 11  . insulin aspart protamine- aspart (NOVOLOG MIX 70/30) (70-30) 100 UNIT/ML injection Inject 0.25 mLs (25 Units total) into the skin 2 (two) times daily with a meal. (Patient not taking: Reported on 05/22/2016) 10 mL 11  . methocarbamol (ROBAXIN) 500 MG tablet Take 1 tablet (500 mg total) by mouth 2 (two) times daily. (Patient not taking: Reported on 05/22/2016) 20 tablet 0  . phenazopyridine (PYRIDIUM) 200 MG tablet Take 1 tablet (200  mg total) by mouth 3 (three) times daily with meals. (Patient not taking: Reported on 05/22/2016) 10 tablet 0   No facility-administered medications prior to visit.      Allergies:   Furosemide; Diamox; and Lisinopril   Social History   Social History  . Marital Status: Married    Spouse Name: N/A  . Number of Children: N/A  . Years of Education: N/A     Occupational History  . disabled     blind   Social History Main Topics  . Smoking status: Never Smoker   . Smokeless tobacco: Never Used  . Alcohol Use: No  . Drug Use: No  . Sexual Activity: Not Currently   Other Topics Concern  . None   Social History Narrative   Occupation: disabled, blind   Married   Regular Exercise-no           Family History:  The patient's family history includes Breast cancer in his mother; Colon cancer in his mother and other; Diabetes in his brother, mother, and sister; Hypertension in his father, father, and mother.   ROS:   Please see the history of present illness.    Review of Systems  Musculoskeletal: Positive for back pain and myalgias.  Genitourinary: Positive for incomplete emptying.   All other systems reviewed and are negative.   Physical Exam:    VS:  BP 118/70 mmHg  Pulse 80  Ht 5' 10" (1.778 m)  Wt 244 lb (110.678 kg)  BMI 35.01 kg/m2   Physical Exam  Constitutional: He is oriented to person, place, and time. He appears well-developed and well-nourished. No distress.  HENT:  Head: Normocephalic and atraumatic.  Neck: Normal range of motion. No JVD present. Carotid bruit is not present.  Cardiovascular: Normal rate, regular rhythm and normal heart sounds.   No murmur heard. Pulmonary/Chest: Effort normal and breath sounds normal. He has no wheezes. He has no rales.  Abdominal: Soft. He exhibits no mass. There is no tenderness.  Musculoskeletal: He exhibits no edema.  Neurological: He is alert and oriented to person, place, and time.  Skin: Skin is warm and dry.    Psychiatric: He has a normal mood and affect.     Wt Readings from Last 3 Encounters:  05/22/16 244 lb (110.678 kg)  04/09/16 244 lb (110.678 kg)  10/26/15 241 lb 12.8 oz (109.68 kg)      Studies/Labs Reviewed:     EKG:  EKG is  ordered today.  The ekg ordered today demonstrates NSR, HR 80, LAD, PRWP, QTc 401 ms, no sig change since prior tracing  Recent Labs: 04/09/2016: ALT 11; BUN 14; Creatinine, Ser 0.93; Hemoglobin 12.4*; Platelets 223.0; Potassium 4.2; Sodium 138; TSH 1.50   Recent Lipid Panel    Component Value Date/Time   CHOL 171 04/09/2016 1041   TRIG 127.0 04/09/2016 1041   HDL 28.20* 04/09/2016 1041   CHOLHDL 6 04/09/2016 1041   VLDL 25.4 04/09/2016 1041   LDLCALC 117* 04/09/2016 1041   LDLDIRECT 82.7 04/04/2013 1451    Additional studies/ records that were reviewed today include:   Myoview 9/14 Overall Impression: Normal stress nuclear study. There is no evidence of ischemia. LV Ejection Fraction: 63%. LV Wall Motion: NL LV Function; NL Wall Motion.  LHC 11/08 LM no significant disease LAD proximal 30%, distal 80% at the apex LCx proximal 95%, second branching segment of OM1 70% proximal, OM 2 proximal 60% distal to previously placed stent RCA proximal/mid 95% EF 65% PCI: BMS to the proximal LCx   ASSESSMENT:     1. Coronary artery disease involving native coronary artery of native heart with angina pectoris (HCC)   2. HYPERTENSION, BENIGN ESSENTIAL   3. Pure hypercholesterolemia   4. Type 2 diabetes mellitus with complication, with long-term current use of insulin (HCC)     PLAN:     In order of problems listed above:  1. CAD -   He is overall doing well without recurrent anginal symptoms on current medical regimen which includes beta blocker, nitrates, statin, ASA.  Of note, he sees ortho later today for his back pain.  Given his comorbid illnesses and sedentary lifestyle, he would require stress testing for risk stratification should he need  surgery in the near future.    2. HTN - Blood pressure is controlled on current regimen which isosorbide, losartan, carvedilol. Creatinine in 4/17 was 0.93.  3. HL - This is managed by primary care. LDL in 4/17 was 117. Goal LDL is less than 70. Patient thinks that he was off of his Zetia at that time. He will continue Zetia and pravastatin and follow-up with primary care.  4. Diabetes - Well controlled. Hemoglobin A1c 6.7 in 4/17.   Medication Adjustments/Labs and Tests Ordered: Current medicines are reviewed at length with the patient today.  Concerns regarding medicines are outlined above.  Medication changes, Labs and Tests ordered today are outlined in the Patient Instructions noted below. Patient Instructions  Medication Instructions:  REFILLS HAVE BEEN SENT IN FOR IMDUR, COZAAR, PRAVASTATIN Labwork: NONE Testing/Procedures: NONE Follow-Up: Your physician wants you to follow-up in: 6 MONTHS WITH DR. SKAINS You will receive a reminder letter in the mail two months in advance. If you don't receive a letter, please call our office to schedule the follow-up appointment. Any Other Special Instructions Will Be Listed Below (If Applicable). If you need a refill on your cardiac medications before your next appointment, please call your pharmacy.   Signed, Scott Weaver, PA-C  05/22/2016 4:11 PM    [facility] [address], [place], [place]  [address] Phone: [phone]; Fax: [phone]     

## 2016-05-22 NOTE — Patient Instructions (Addendum)
Medication Instructions:  REFILLS HAVE BEEN SENT IN FOR IMDUR, COZAAR, PRAVASTATIN  START ASPIRIN 81 MG DAILY Labwork: NONE Testing/Procedures: NONE Follow-Up: Your physician wants you to follow-up in: 6 MONTHS WITH DR. Marlou Porch You will receive a reminder letter in the mail two months in advance. If you don't receive a letter, please call our office to schedule the follow-up appointment. Any Other Special Instructions Will Be Listed Below (If Applicable). If you need a refill on your cardiac medications before your next appointment, please call your pharmacy.

## 2016-05-23 ENCOUNTER — Other Ambulatory Visit: Payer: Self-pay | Admitting: Specialist

## 2016-05-23 DIAGNOSIS — M48061 Spinal stenosis, lumbar region without neurogenic claudication: Secondary | ICD-10-CM

## 2016-06-01 ENCOUNTER — Ambulatory Visit
Admission: RE | Admit: 2016-06-01 | Discharge: 2016-06-01 | Disposition: A | Discharge status: Home / Self Care | Payer: Medicare Other | Source: Ambulatory Visit | Attending: Specialist | Admitting: Specialist

## 2016-06-01 DIAGNOSIS — M48061 Spinal stenosis, lumbar region without neurogenic claudication: Secondary | ICD-10-CM

## 2016-06-01 MED ORDER — GADOBENATE DIMEGLUMINE 529 MG/ML IV SOLN
20.0000 mL | Freq: Once | INTRAVENOUS | Status: AC | PRN
  Administered 2016-06-01: 20 mL via INTRAVENOUS

## 2016-07-02 ENCOUNTER — Ambulatory Visit: Payer: Medicare Other | Admitting: Podiatry

## 2016-07-09 ENCOUNTER — Ambulatory Visit: Payer: Medicare Other | Admitting: Podiatry

## 2016-07-23 ENCOUNTER — Other Ambulatory Visit: Payer: Self-pay | Admitting: Internal Medicine

## 2016-07-23 DIAGNOSIS — I5022 Chronic systolic (congestive) heart failure: Secondary | ICD-10-CM

## 2016-07-26 ENCOUNTER — Encounter (HOSPITAL_COMMUNITY): Payer: Self-pay | Admitting: *Deleted

## 2016-07-26 ENCOUNTER — Emergency Department (HOSPITAL_COMMUNITY): Payer: Medicare Other

## 2016-07-26 DIAGNOSIS — N400 Enlarged prostate without lower urinary tract symptoms: Secondary | ICD-10-CM | POA: Insufficient documentation

## 2016-07-26 DIAGNOSIS — Z6832 Body mass index (BMI) 32.0-32.9, adult: Secondary | ICD-10-CM | POA: Insufficient documentation

## 2016-07-26 DIAGNOSIS — R0789 Other chest pain: Secondary | ICD-10-CM | POA: Diagnosis not present

## 2016-07-26 DIAGNOSIS — R0602 Shortness of breath: Secondary | ICD-10-CM | POA: Diagnosis not present

## 2016-07-26 DIAGNOSIS — E785 Hyperlipidemia, unspecified: Secondary | ICD-10-CM | POA: Insufficient documentation

## 2016-07-26 DIAGNOSIS — I2510 Atherosclerotic heart disease of native coronary artery without angina pectoris: Secondary | ICD-10-CM | POA: Insufficient documentation

## 2016-07-26 DIAGNOSIS — I110 Hypertensive heart disease with heart failure: Secondary | ICD-10-CM | POA: Insufficient documentation

## 2016-07-26 DIAGNOSIS — Z7982 Long term (current) use of aspirin: Secondary | ICD-10-CM | POA: Insufficient documentation

## 2016-07-26 DIAGNOSIS — G4733 Obstructive sleep apnea (adult) (pediatric): Secondary | ICD-10-CM | POA: Insufficient documentation

## 2016-07-26 DIAGNOSIS — Z794 Long term (current) use of insulin: Secondary | ICD-10-CM | POA: Diagnosis not present

## 2016-07-26 DIAGNOSIS — E669 Obesity, unspecified: Secondary | ICD-10-CM | POA: Diagnosis not present

## 2016-07-26 DIAGNOSIS — E11649 Type 2 diabetes mellitus with hypoglycemia without coma: Secondary | ICD-10-CM | POA: Diagnosis not present

## 2016-07-26 DIAGNOSIS — Z955 Presence of coronary angioplasty implant and graft: Secondary | ICD-10-CM | POA: Diagnosis not present

## 2016-07-26 DIAGNOSIS — I509 Heart failure, unspecified: Secondary | ICD-10-CM | POA: Diagnosis not present

## 2016-07-26 DIAGNOSIS — Z79899 Other long term (current) drug therapy: Secondary | ICD-10-CM | POA: Insufficient documentation

## 2016-07-26 DIAGNOSIS — G894 Chronic pain syndrome: Secondary | ICD-10-CM | POA: Diagnosis not present

## 2016-07-26 DIAGNOSIS — R079 Chest pain, unspecified: Secondary | ICD-10-CM | POA: Diagnosis present

## 2016-07-26 DIAGNOSIS — H540 Blindness, both eyes: Secondary | ICD-10-CM | POA: Insufficient documentation

## 2016-07-26 DIAGNOSIS — E7800 Pure hypercholesterolemia, unspecified: Secondary | ICD-10-CM | POA: Insufficient documentation

## 2016-07-26 LAB — I-STAT TROPONIN, ED: Troponin i, poc: 0 ng/mL (ref 0.00–0.08)

## 2016-07-26 LAB — COMPREHENSIVE METABOLIC PANEL
ALBUMIN: 3.8 g/dL (ref 3.5–5.0)
ALK PHOS: 49 U/L (ref 38–126)
ALT: 17 U/L (ref 17–63)
AST: 18 U/L (ref 15–41)
Anion gap: 8 (ref 5–15)
BILIRUBIN TOTAL: 0.7 mg/dL (ref 0.3–1.2)
BUN: 16 mg/dL (ref 6–20)
CALCIUM: 9.4 mg/dL (ref 8.9–10.3)
CO2: 28 mmol/L (ref 22–32)
CREATININE: 1.23 mg/dL (ref 0.61–1.24)
Chloride: 100 mmol/L — ABNORMAL LOW (ref 101–111)
GFR calc Af Amer: >60 mL/min (ref >60)
GFR, EST NON AFRICAN AMERICAN: 59 mL/min — AB (ref >60)
GLUCOSE: 83 mg/dL (ref 65–99)
Potassium: 3.7 mmol/L (ref 3.5–5.1)
Sodium: 136 mmol/L (ref 135–145)
TOTAL PROTEIN: 7.2 g/dL (ref 6.5–8.1)

## 2016-07-26 NOTE — ED Triage Notes (Signed)
Patient presents with c/o feeling like he could not catch his breath, feels congested in his throat and his chest hurts when he clears his throat or coughs

## 2016-07-27 ENCOUNTER — Observation Stay (HOSPITAL_COMMUNITY): Payer: Medicare Other

## 2016-07-27 ENCOUNTER — Observation Stay (HOSPITAL_BASED_OUTPATIENT_CLINIC_OR_DEPARTMENT_OTHER): Payer: Medicare Other

## 2016-07-27 ENCOUNTER — Observation Stay (HOSPITAL_COMMUNITY)
Admission: EM | Admit: 2016-07-27 | Discharge: 2016-07-28 | Disposition: A | Discharge status: Home / Self Care | Payer: Medicare Other | Attending: Family Medicine | Admitting: Family Medicine

## 2016-07-27 ENCOUNTER — Encounter (HOSPITAL_COMMUNITY): Payer: Self-pay | Admitting: Family Medicine

## 2016-07-27 DIAGNOSIS — G894 Chronic pain syndrome: Secondary | ICD-10-CM | POA: Diagnosis present

## 2016-07-27 DIAGNOSIS — I509 Heart failure, unspecified: Secondary | ICD-10-CM

## 2016-07-27 DIAGNOSIS — IMO0002 Reserved for concepts with insufficient information to code with codable children: Secondary | ICD-10-CM | POA: Diagnosis present

## 2016-07-27 DIAGNOSIS — I25119 Atherosclerotic heart disease of native coronary artery with unspecified angina pectoris: Secondary | ICD-10-CM | POA: Diagnosis not present

## 2016-07-27 DIAGNOSIS — E785 Hyperlipidemia, unspecified: Secondary | ICD-10-CM

## 2016-07-27 DIAGNOSIS — I2510 Atherosclerotic heart disease of native coronary artery without angina pectoris: Secondary | ICD-10-CM | POA: Diagnosis present

## 2016-07-27 DIAGNOSIS — G4730 Sleep apnea, unspecified: Secondary | ICD-10-CM | POA: Diagnosis present

## 2016-07-27 DIAGNOSIS — R079 Chest pain, unspecified: Secondary | ICD-10-CM

## 2016-07-27 DIAGNOSIS — E118 Type 2 diabetes mellitus with unspecified complications: Secondary | ICD-10-CM | POA: Diagnosis not present

## 2016-07-27 DIAGNOSIS — E1165 Type 2 diabetes mellitus with hyperglycemia: Secondary | ICD-10-CM

## 2016-07-27 DIAGNOSIS — I10 Essential (primary) hypertension: Secondary | ICD-10-CM | POA: Diagnosis not present

## 2016-07-27 DIAGNOSIS — R0789 Other chest pain: Principal | ICD-10-CM

## 2016-07-27 DIAGNOSIS — R0602 Shortness of breath: Secondary | ICD-10-CM

## 2016-07-27 DIAGNOSIS — Z955 Presence of coronary angioplasty implant and graft: Secondary | ICD-10-CM

## 2016-07-27 DIAGNOSIS — R0600 Dyspnea, unspecified: Secondary | ICD-10-CM

## 2016-07-27 DIAGNOSIS — I25118 Atherosclerotic heart disease of native coronary artery with other forms of angina pectoris: Secondary | ICD-10-CM

## 2016-07-27 DIAGNOSIS — R0609 Other forms of dyspnea: Secondary | ICD-10-CM

## 2016-07-27 LAB — CBC WITH DIFFERENTIAL/PLATELET
BASOS PCT: 0 %
Basophils Absolute: 0 10*3/uL (ref 0.0–0.1)
EOS PCT: 2 %
Eosinophils Absolute: 0.1 10*3/uL (ref 0.0–0.7)
HCT: 39.1 % (ref 39.0–52.0)
Hemoglobin: 12.6 g/dL — ABNORMAL LOW (ref 13.0–17.0)
LYMPHS ABS: 2.6 10*3/uL (ref 0.7–4.0)
Lymphocytes Relative: 41 %
MCH: 23.3 pg — AB (ref 26.0–34.0)
MCHC: 32.2 g/dL (ref 30.0–36.0)
MCV: 72.4 fL — AB (ref 78.0–100.0)
MONO ABS: 0.5 10*3/uL (ref 0.1–1.0)
Monocytes Relative: 8 %
NEUTROS ABS: 3.2 10*3/uL (ref 1.7–7.7)
NEUTROS PCT: 49 %
PLATELETS: 228 10*3/uL (ref 150–400)
RBC: 5.4 MIL/uL (ref 4.22–5.81)
RDW: 14.3 % (ref 11.5–15.5)
WBC: 6.4 10*3/uL (ref 4.0–10.5)

## 2016-07-27 LAB — NM MYOCAR MULTI W/SPECT W/WALL MOTION / EF
CSEPED: 0 min
CSEPEW: 1 METS
CSEPHR: 55 %
CSEPPHR: 86 {beats}/min
Exercise duration (sec): 0 s
MPHR: 154 {beats}/min
Rest HR: 61 {beats}/min

## 2016-07-27 LAB — RAPID URINE DRUG SCREEN, HOSP PERFORMED
Amphetamines: NOT DETECTED
BARBITURATES: NOT DETECTED
BENZODIAZEPINES: NOT DETECTED
COCAINE: NOT DETECTED
Opiates: POSITIVE — AB
Tetrahydrocannabinol: NOT DETECTED

## 2016-07-27 LAB — GLUCOSE, CAPILLARY
GLUCOSE-CAPILLARY: 140 mg/dL — AB (ref 65–99)
Glucose-Capillary: 68 mg/dL (ref 65–99)
Glucose-Capillary: 152 mg/dL — ABNORMAL HIGH (ref 65–99)
Glucose-Capillary: 126 mg/dL — ABNORMAL HIGH (ref 65–99)

## 2016-07-27 LAB — TROPONIN I
Troponin I: <0.03 ng/mL (ref <0.03)
Troponin I: <0.03 ng/mL (ref <0.03)

## 2016-07-27 LAB — BRAIN NATRIURETIC PEPTIDE: B NATRIURETIC PEPTIDE 5: 4.1 pg/mL (ref 0.0–100.0)

## 2016-07-27 MED ORDER — EZETIMIBE 10 MG PO TABS
10.0000 mg | ORAL_TABLET | Freq: Every day | ORAL | Status: DC
  Administered 2016-07-27 – 2016-07-28 (×2): 10 mg via ORAL
  Filled 2016-07-27 (×2): qty 1

## 2016-07-27 MED ORDER — TECHNETIUM TC 99M TETROFOSMIN IV KIT
10.0000 | PACK | Freq: Once | INTRAVENOUS | Status: AC | PRN
  Administered 2016-07-27: 10 via INTRAVENOUS

## 2016-07-27 MED ORDER — ISOSORBIDE MONONITRATE ER 60 MG PO TB24
120.0000 mg | ORAL_TABLET | Freq: Every day | ORAL | Status: DC
  Administered 2016-07-27 – 2016-07-28 (×2): 120 mg via ORAL
  Filled 2016-07-27 (×2): qty 2

## 2016-07-27 MED ORDER — MORPHINE SULFATE (PF) 4 MG/ML IV SOLN
4.0000 mg | Freq: Once | INTRAVENOUS | Status: AC
  Administered 2016-07-27: 4 mg via INTRAVENOUS
  Filled 2016-07-27: qty 1

## 2016-07-27 MED ORDER — LOSARTAN POTASSIUM 50 MG PO TABS
50.0000 mg | ORAL_TABLET | Freq: Every day | ORAL | Status: DC
  Administered 2016-07-27 – 2016-07-28 (×2): 50 mg via ORAL
  Filled 2016-07-27 (×2): qty 1

## 2016-07-27 MED ORDER — INSULIN ASPART 100 UNIT/ML ~~LOC~~ SOLN: 0.0000 [IU] | Freq: Every day | SUBCUTANEOUS | Status: DC

## 2016-07-27 MED ORDER — NITROGLYCERIN 0.4 MG SL SUBL: 0.4000 mg | SUBLINGUAL_TABLET | SUBLINGUAL | Status: DC | PRN

## 2016-07-27 MED ORDER — TRAMADOL HCL 50 MG PO TABS
50.0000 mg | ORAL_TABLET | Freq: Four times a day (QID) | ORAL | Status: DC | PRN
  Administered 2016-07-27: 50 mg via ORAL
  Filled 2016-07-27: qty 1

## 2016-07-27 MED ORDER — RANOLAZINE ER 500 MG PO TB12
500.0000 mg | ORAL_TABLET | Freq: Two times a day (BID) | ORAL | Status: DC
  Administered 2016-07-27 – 2016-07-28 (×2): 500 mg via ORAL
  Filled 2016-07-27 (×2): qty 1

## 2016-07-27 MED ORDER — ASPIRIN EC 81 MG PO TBEC
81.0000 mg | DELAYED_RELEASE_TABLET | Freq: Every day | ORAL | Status: DC
  Administered 2016-07-27 – 2016-07-28 (×2): 81 mg via ORAL
  Filled 2016-07-27 (×2): qty 1

## 2016-07-27 MED ORDER — REGADENOSON 0.4 MG/5ML IV SOLN
INTRAVENOUS | Status: AC
  Administered 2016-07-27: 11:00:00
  Filled 2016-07-27: qty 5

## 2016-07-27 MED ORDER — LEVALBUTEROL HCL 1.25 MG/0.5ML IN NEBU
1.2500 mg | INHALATION_SOLUTION | Freq: Once | RESPIRATORY_TRACT | Status: AC
  Administered 2016-07-27: 1.25 mg via RESPIRATORY_TRACT
  Filled 2016-07-27 (×2): qty 0.5

## 2016-07-27 MED ORDER — ACETAMINOPHEN 325 MG PO TABS: 650.0000 mg | ORAL_TABLET | ORAL | Status: DC | PRN

## 2016-07-27 MED ORDER — PRAVASTATIN SODIUM 40 MG PO TABS
40.0000 mg | ORAL_TABLET | Freq: Every day | ORAL | Status: DC
  Administered 2016-07-27 – 2016-07-28 (×2): 40 mg via ORAL
  Filled 2016-07-27 (×2): qty 1

## 2016-07-27 MED ORDER — REGADENOSON 0.4 MG/5ML IV SOLN
0.4000 mg | Freq: Once | INTRAVENOUS | Status: AC
  Administered 2016-07-27: 0.4 mg via INTRAVENOUS
  Filled 2016-07-27: qty 5

## 2016-07-27 MED ORDER — INSULIN ASPART 100 UNIT/ML ~~LOC~~ SOLN
0.0000 [IU] | SUBCUTANEOUS | Status: DC
  Administered 2016-07-28: 2 [IU] via SUBCUTANEOUS
  Administered 2016-07-28: 3 [IU] via SUBCUTANEOUS

## 2016-07-27 MED ORDER — ONDANSETRON HCL 4 MG/2ML IJ SOLN: 4.0000 mg | Freq: Four times a day (QID) | INTRAMUSCULAR | Status: DC | PRN

## 2016-07-27 MED ORDER — GABAPENTIN 300 MG PO CAPS
300.0000 mg | ORAL_CAPSULE | Freq: Three times a day (TID) | ORAL | Status: DC
Start: 2016-07-27 — End: 2016-07-28
  Administered 2016-07-27 – 2016-07-28 (×4): 300 mg via ORAL
  Filled 2016-07-27 (×4): qty 1

## 2016-07-27 MED ORDER — DEXTROSE 50 % IV SOLN
INTRAVENOUS | Status: AC
Start: 2016-07-27 — End: 2016-07-27
  Administered 2016-07-27: 50 mL
  Filled 2016-07-27: qty 50

## 2016-07-27 MED ORDER — CARVEDILOL 12.5 MG PO TABS
12.5000 mg | ORAL_TABLET | Freq: Two times a day (BID) | ORAL | Status: DC
  Administered 2016-07-27 – 2016-07-28 (×3): 12.5 mg via ORAL
  Filled 2016-07-27 (×3): qty 1

## 2016-07-27 MED ORDER — INSULIN ASPART 100 UNIT/ML ~~LOC~~ SOLN: 0.0000 [IU] | Freq: Three times a day (TID) | SUBCUTANEOUS | Status: DC

## 2016-07-27 MED ORDER — OXYCODONE-ACETAMINOPHEN 5-325 MG PO TABS: 2.0000 | ORAL_TABLET | ORAL | Status: DC | PRN

## 2016-07-27 MED ORDER — GI COCKTAIL ~~LOC~~
30.0000 mL | Freq: Four times a day (QID) | ORAL | Status: DC | PRN
  Administered 2016-07-27: 30 mL via ORAL
  Filled 2016-07-27: qty 30

## 2016-07-27 MED ORDER — TECHNETIUM TC 99M TETROFOSMIN IV KIT
30.0000 | PACK | Freq: Once | INTRAVENOUS | Status: AC | PRN
  Administered 2016-07-27: 30 via INTRAVENOUS

## 2016-07-27 MED ORDER — ASPIRIN 81 MG PO CHEW
324.0000 mg | CHEWABLE_TABLET | Freq: Once | ORAL | Status: AC
  Administered 2016-07-27: 324 mg via ORAL
  Filled 2016-07-27: qty 4

## 2016-07-27 MED ORDER — NITROGLYCERIN 0.4 MG SL SUBL
0.4000 mg | SUBLINGUAL_TABLET | SUBLINGUAL | Status: DC | PRN
  Administered 2016-07-27: 0.4 mg via SUBLINGUAL
  Filled 2016-07-27: qty 1

## 2016-07-27 MED ORDER — TAMSULOSIN HCL 0.4 MG PO CAPS
0.4000 mg | ORAL_CAPSULE | Freq: Every day | ORAL | Status: DC
  Administered 2016-07-27 – 2016-07-28 (×2): 0.4 mg via ORAL
  Filled 2016-07-27 (×2): qty 1

## 2016-07-27 NOTE — ED Notes (Signed)
Patient called out, stating his "CP and SOB haven't been letting up." MD aware, ordered repeat EKG.

## 2016-07-27 NOTE — ED Provider Notes (Addendum)
First MD Initiated Contact with Patient 07/27/16 0216      By signing my name below, I, Maud Deed. Royston Sinner, attest that this documentation has been prepared under the direction and in the presence of [place], DO.  Electronically Signed: Maud Deed. Royston Sinner, ED Scribe. 07/27/16. 2:23 AM.   TIME SEEN: 2:22 AM   CHIEF COMPLAINT:  Chief Complaint  Patient presents with  . Shortness of Breath     HPI:   HPI Comments: [name] is a 65 y.o. male with a PMHx of CAD, CHF, HTN, s/p stent placement x 2  who presents to the Emergency Department complaining of constant, unchanged exertional shortness of breath onset yesterday. He also reports generalized fatigue, chest discomfort described as burning/pressure/tightness, and congestion. Chest pain is made worse with deep breathing and exertion. No alleviating factors reported. No OTC medications or home remedies attempted prior to arrival. No recent fever, chills, nausea, vomiting, abdominal pain, or diaphoresis. Pt states currently symptoms feel similar to symptoms experienced prior to stent placement.  States He has had lightheadedness, generalized weakness.  States that his prior episodes of CAD he did feel like there was phlegm in the back of his throat and had dry cough. Denies ever being a smoker. No history of asthma or COPD. No wheezing.   PCP: [name], MD  In [place] CARDIOLOGIST: Candee Furbish, MD  ROS: See HPI Constitutional: no fever. Positive fatigue  Eyes: no drainage  ENT: no runny nose   Cardiovascular:  Positive chest pain  Resp: Positive SOB and congestion GI: no vomiting GU: no dysuria Integumentary: no rash  Allergy: no hives  Musculoskeletal: no leg swelling  Neurological: no slurred speech ROS otherwise negative  PAST MEDICAL HISTORY/PAST SURGICAL HISTORY:  Past Medical History:  Diagnosis Date  . Arthritis    "mild arthritis in hip"  . CAD (coronary artery disease)   . CHF (congestive heart failure) ([place])    . Complication of anesthesia    "lungs filled up with fluid"   . Diabetes mellitus    Type II  . Glaucoma   . Heart murmur   . HTN (hypertension)   . Hypercholesterolemia   . LBP (low back pain)   . Legally blind   . Sleep apnea   . Staph infection    "from back surgery"  . Trigger finger   . Ulcer   . Wears glasses    "to protect cornea"    MEDICATIONS:  Prior to Admission medications   Medication Sig Start Date End Date Taking? Authorizing Provider  aspirin EC 81 MG tablet Take 1 tablet (81 mg total) by mouth daily. 05/22/16   Scott T Kathlen Mody, PA-C  B-D INS SYR ULTRAFINE 1CC/30G 30G X 1/2" 1 ML MISC USE AS DIRECTED WITH INSULIN VIALS 03/25/16   Historical Provider, MD  Blood Glucose Calibration (ONETOUCH VERIO) SOLN 1 Act by In Vitro route 2 (two) times daily. 08/22/15   [name], MD  Blood Glucose Monitoring Suppl (PRODIGY VOICE BLOOD GLUCOSE) W/DEVICE KIT Use to check blood sugars twice a day Dx e11.9 10/30/15   Geradine Girt, DO  carvedilol (COREG) 12.5 MG tablet TAKE 1 TABLET BY MOUTH TWICE A DAY WITH A MEAL 07/23/16   [name], MD  ezetimibe (ZETIA) 10 MG tablet Take 1 tablet (10 mg total) by mouth daily. 08/22/15   [name], MD  gabapentin (NEURONTIN) 300 MG capsule TAKE 1 CAPSULE (300 MG TOTAL) BY MOUTH  3 (THREE) TIMES DAILY. 07/23/15   Lyndal Pulley, DO  glucose blood [facility] VERIO) test strip Use BID 08/22/15   [name], MD  glucose blood test strip 1 each by Other route 2 (two) times daily. Use to check blood sugars twice a day Dx E11.9 08/08/15   [name], MD  insulin lispro protamine-lispro (HUMALOG 75/25 MIX) (75-25) 100 UNIT/ML SUSP injection Inject 45-85 Units into the skin 2 (two) times daily with a meal. Use 85 units every morning and use 45 units every evening 08/21/15   [name], MD  INSULIN SYRINGE 1CC/29G (B-D INSULIN SYRINGE) 29G X 1/2" 1 ML MISC Use as directed with insulin vials 08/21/15   [name], MD  isosorbide  mononitrate (IMDUR) 120 MG 24 hr tablet TAKE 1 TABLET (120 MG TOTAL) BY MOUTH DAILY. 05/22/16   Scott Joylene Draft, PA-C  JANUMET 50-1000 MG tablet TAKE 1 TABLET BY MOUTH TWICE A DAY WITH A MEAL 02/05/16   [name], MD  losartan (COZAAR) 50 MG tablet Take 1 tablet (50 mg total) by mouth daily. 05/22/16   Liliane Shi, PA-C  omega-3 acid ethyl esters (LOVAZA) 1 g capsule TAKE 2 CAPSULES (2 G TOTAL) BY MOUTH 2 (TWO) TIMES DAILY. 12/19/15   [name], MD  oxyCODONE-acetaminophen (PERCOCET/ROXICET) 5-325 MG tablet Take 2 tablets by mouth every 4 (four) hours as needed for severe pain. 04/25/16   Nona Dell, PA-C  polyethylene glycol [facility] / GLYCOLAX) packet Take 17 g by mouth daily as needed for mild constipation. 10/30/15   Jessy Oto, MD  pravastatin (PRAVACHOL) 40 MG tablet TAKE 1 TABLET (40 MG TOTAL) BY MOUTH DAILY. DAILY 05/22/16   Liliane Shi, PA-C  PRODIGY LANCETS 28G MISC Use to check blood sugars twice a day Dx e11.9 08/08/15   [name], MD  tamsulosin (FLOMAX) 0.4 MG CAPS capsule Take 1 capsule (0.4 mg total) by mouth daily. 10/30/15   Jessy Oto, MD  traMADol (ULTRAM) 50 MG tablet TAKE 1 TO 2 TABLETS BY MOUTH EVERY 4 TO 6 HOURS AS NEEDED FOR PAIN (INS PAYS FOR 8 TABS PER DAY) 08/08/15   Historical Provider, MD    ALLERGIES:  Allergies  Allergen Reactions  . Furosemide     pancreatitis  . Diamox [Acetazolamide] Itching  . Lisinopril Rash    SOCIAL HISTORY:  Social History  Substance Use Topics  . Smoking status: Never Smoker  . Smokeless tobacco: Never Used  . Alcohol use No    FAMILY HISTORY: Family History  Problem Relation Age of Onset  . Breast cancer Mother   . Colon cancer Mother   . Hypertension Mother   . Diabetes Mother   . Hypertension Father   . Colon cancer Other     Elevated Risk for  . Diabetes Sister   . Diabetes Brother     EXAM: BP 155/79 (BP Location: Right Arm)   Pulse 83   Temp 99.9 F (37.7 C) (Oral)   Resp 18    Ht _0  (1.803 m)   Wt 240 lb (108.9 kg)   SpO2 97%   BMI 33.47 kg/m  CONSTITUTIONAL: Alert and oriented and responds appropriately to questions. Obese. HEAD: Normocephalic EYES: Conjunctivae clear, PERRL ENT: normal nose; no rhinorrhea; moist mucous membranes NECK: Supple, no meningismus, no LAD; No JVD CARD: RRR; S1 and S2 appreciated; no murmurs, no clicks, no rubs, no gallops RESP: Normal chest excursion without  splinting or tachypnea; breath sounds clear and equal bilaterally; no wheezes, no rhonchi, no rales, no hypoxia or respiratory distress, speaking full sentences ABD/GI: Normal bowel sounds; non-distended; soft, non-tender, no rebound, no guarding, no peritoneal signs BACK:  The back appears normal and is non-tender to palpation, there is no CVA tenderness EXT: Normal ROM in all joints; non-tender to palpation; no edema; normal capillary refill; no cyanosis, no calf tenderness or swelling    SKIN: Normal color for age and race; warm; no rash NEURO: Moves all extremities equally, sensation to light touch intact diffusely, cranial nerves II through XII intact PSYCH: The patient's mood and manner are appropriate. Grooming and personal hygiene are appropriate.  MEDICAL DECISION MAKING: Patient here with chest pain, shortness of breath is exertional and feels similar to his prior episodes of CAD requiring stent placement. Reports last cardiac catheterization was several years ago. EKG shows no new ischemic abnormality. First troponin negative. Chest x-ray clear. We'll give aspirin, nitroglycerin to get patient pain-free. Will discuss with medicine for admission.  ED PROGRESS: 2:40 AM  D/w Dr. Loleta Books for admission for chest pain rule out. He will see the patient in the emergency department and place orders. He requests that we give the patient a Xopenex nebulizer treatment.    EKG Interpretation  Date/Time:  Saturday July 26 2016 23:23:54 EDT Ventricular Rate:  82 PR  Interval:  206 QRS Duration: 86 QT Interval:  352 QTC Calculation: 411 R Axis:   -74 Text Interpretation:  Normal sinus rhythm Left axis deviation Abnormal ECG No significant change since last tracing Confirmed by Marta Bouie,  DO, Harlei Lehrmann [phone]) on 07/27/2016 1:17:40 AM       EKG Interpretation  Date/Time:  Sunday July 27 2016 02:10:32 EDT Ventricular Rate:  68 PR Interval:  206 QRS Duration: 104 QT Interval:  375 QTC Calculation: 399 R Axis:   57 Text Interpretation:  Sinus rhythm Low voltage, precordial leads No significant change since last tracing Confirmed by Tula Schryver,  DO, Myleen Brailsford ([id number]) on 07/27/2016 2:14:33 AM         I personally performed the services described in this documentation, which was scribed in my presence. The recorded information has been reviewed and is accurate.        [place], DO 07/27/16 [phone]

## 2016-07-27 NOTE — Progress Notes (Signed)
IMPRESSION: 1. No reversible ischemia or infarction.  2. Normal left ventricular wall motion.  3. Left ventricular ejection fraction 56%  4. Non invasive risk stratification*: Low   myoview result above.  Hilbert Corrigan PA Pager: [phone]

## 2016-07-27 NOTE — Progress Notes (Signed)
PROGRESS NOTE    [name]  [id number] DOB: 04-16-1951 DOA: 07/27/2016 PCP: [name], MD   Brief Narrative: [name] is a 65 y.o. male with a past medical history significant for blindness, IDDM, CAD s/p PCI, HTN, and CHF who presents with chest pain and SOB on exertion for several days.  The patient was in his usual state of health until about 2 days ago when he started to develop shortness of breath associated with a "burning" chest pain, moderate intensity, central or bilateral in the chest that was similar to his previous angina he thinks, from when he had his stents.  He thought it would go away but it didn't so he came tonight to the ER.      Assessment & Plan:   Principal Problem:   Chest pain Active Problems:   Type II diabetes mellitus with complication, uncontrolled (HCC)   Coronary atherosclerosis   Congestive heart failure (HCC)   HYPERTENSION, BENIGN ESSENTIAL   Chronic pain syndrome   Sleep apnea  1. Chest pain:  The patient has HEART score of 5. He claims pain is typical of his previous angina.  -Follow troponin trend.  -Echocardiogram ordered -Cardiology consulted.  -EKG with low voltage  -continue with Zetia, Imdur, aspirin. PRN nitroglycerin.   2. IDDM:  On 70/30 at home, on hold at this time  -Sliding scale corrections q4hrs while NPO for stress  3. HTN and secondary prevention:  -Continue Zetia, baby aspirin, ARB, and statin -Continue Imdur  4. Chronic pain:  -Continue home Percocet or tramadol PRN for pain -Continue gabapentin  5. BPH:  -Continue tamsulosin     DVT prophylaxis: scd Code Status: full code.  Family Communication: Care discussed with patient.  Disposition Plan: Home when cardiac evaluation is completed.    Consultants:   Cardiology   Procedures:   ECHO pending  Antimicrobials:  none   Subjective: He is chest pain free. Yesterday he had SOB and develops chest pain. Similar to pain when he  had stent in the past/.   Objective: Vitals:   07/27/16 0400 07/27/16 0415 07/27/16 0445 07/27/16 0516  BP: 150/71 152/75 172/88 (!) 154/68  Pulse: 61 66 62 63  Resp: 15 16  16   Temp:    97.6 F (36.4 C)  TempSrc:    Oral  SpO2: 96% 95% 96% 98%  Weight:    103.6 kg (228 lb 8 oz)  Height:    5\' 10"  (1.778 m)    Intake/Output Summary (Last 24 hours) at 07/27/16 0718 Last data filed at 07/27/16 0332  Gross per 24 hour  Intake                0 ml  Output              125 ml  Net             -125 ml   Filed Weights   07/26/16 2323 07/27/16 0516  Weight: 108.9 kg (240 lb) 103.6 kg (228 lb 8 oz)    Examination:  General exam: Appears calm and comfortable  Respiratory system: Clear to auscultation. Respiratory effort normal. Cardiovascular system: S1 & S2 heard, RRR. No JVD, murmurs, rubs, gallops or clicks. No pedal edema. Gastrointestinal system: Abdomen is nondistended, soft and nontender. No organomegaly or masses felt. Normal bowel sounds heard. Central nervous system: Alert and oriented. No focal neurological deficits. Extremities: Symmetric 5 x 5 power. Skin: No rashes, lesions or ulcers Psychiatry:  Judgement and insight appear normal. Mood & affect appropriate.     Data Reviewed: I have personally reviewed following labs and imaging studies  CBC:  Recent Labs Lab 07/26/16 2334  WBC 6.4  NEUTROABS 3.2  HGB 12.6*  HCT 39.1  MCV 72.4*  PLT [id number]   Basic Metabolic Panel:  Recent Labs Lab 07/26/16 2334  NA 136  K 3.7  CL 100*  CO2 28  GLUCOSE 83  BUN 16  CREATININE 1.23  CALCIUM 9.4   GFR: Estimated Creatinine Clearance: 71.2 mL/min (by C-G formula based on SCr of 1.23 mg/dL). Liver Function Tests:  Recent Labs Lab 07/26/16 2334  AST 18  ALT 17  ALKPHOS 49  BILITOT 0.7  PROT 7.2  ALBUMIN 3.8   No results for input(s): LIPASE, AMYLASE in the last 168 hours. No results for input(s): AMMONIA in the last 168 hours. Coagulation Profile: No  results for input(s): INR, PROTIME in the last 168 hours. Cardiac Enzymes: No results for input(s): CKTOTAL, CKMB, CKMBINDEX, TROPONINI in the last 168 hours. BNP (last 3 results) No results for input(s): PROBNP in the last 8760 hours. HbA1C: No results for input(s): HGBA1C in the last 72 hours. CBG:  Recent Labs Lab 07/27/16 0552 07/27/16 0632  GLUCAP 68 152*   Lipid Profile: No results for input(s): CHOL, HDL, LDLCALC, TRIG, CHOLHDL, LDLDIRECT in the last 72 hours. Thyroid Function Tests: No results for input(s): TSH, T4TOTAL, FREET4, T3FREE, THYROIDAB in the last 72 hours. Anemia Panel: No results for input(s): VITAMINB12, FOLATE, FERRITIN, TIBC, IRON, RETICCTPCT in the last 72 hours. Sepsis Labs: No results for input(s): PROCALCITON, LATICACIDVEN in the last 168 hours.  No results found for this or any previous visit (from the past 240 hour(s)).       Radiology Studies: Dg Chest 2 View  Result Date: 07/27/2016 CLINICAL DATA:  Dyspnea beginning at 10 p.m. Throat congestion, chest pain. History of hypertension, diabetes and coronary artery stents. EXAM: CHEST  2 VIEW COMPARISON:  Chest radiograph October 28, 2010 FINDINGS: Cardiomediastinal silhouette is normal. The lungs are clear without pleural effusions or focal consolidations. Trachea projects midline and there is no pneumothorax. Soft tissue planes and included osseous structures are non-suspicious. Mild degenerative change of the thoracic spine and LEFT shoulder. Surgical clips in the included right abdomen compatible with cholecystectomy. IMPRESSION: No active cardiopulmonary disease. Electronically Signed   By: Elon Alas M.D.   On: 07/27/2016 00:01        Scheduled Meds: . aspirin EC  81 mg Oral Daily  . ezetimibe  10 mg Oral Daily  . gabapentin  300 mg Oral TID  . insulin aspart  0-15 Units Subcutaneous Q4H  . isosorbide mononitrate  120 mg Oral Daily  . losartan  50 mg Oral Daily  . pravastatin  40  mg Oral Daily  . tamsulosin  0.4 mg Oral Daily   Continuous Infusions:    LOS: 0 days    Time spent: 35 minutes.     Elmarie Shiley, MD [facility] Pager [phone]  If 7PM-7AM, please contact night-coverage [email or web address] Password [email or web address] 07/27/2016, 7:18 AM

## 2016-07-27 NOTE — H&P (Signed)
History and Physical  Patient Name: CAILLOU MINUS     FVC:[id number]    DOB: May 13, 1951    DOA: 07/27/2016 PCP: [name], MD   Patient coming from: Home     Chief Complaint: Chest pain  HPI: BHARGAV BARBARO is a 65 y.o. male with a past medical history significant for blindness, IDDM, CAD s/p PCI, HTN, and CHF who presents with chest pain and SOB on exertion for several days.  The patient was in his usual state of health until about 2 days ago when he started to develop shortness of breath associated with a "burning" chest pain, moderate intensity, central or bilateral in the chest that was similar to his previous angina he thinks, from when he had his stents.  He thought it would go away but it didn't so he came tonight to the ER.    ED course: -Low grade temperature, heart rate and respirations normal, slightly hypertensive -Initial ECG showed no ischemic changes and troponin was negative. -Na 136, K 3.7, Cr 1.2 (baseline 1.0), WBC 6.4, Hgb 12.6 -CXR showed no focal opacity -TRH was asked to admit for observation, serial troponins and risk stratification.  Last nuclear perfusion study was in 2014 and was low risk.  He has IDDM, hypertension, and obesity and previously stented cardiovascular disease.     Review of Systems:  He had no leg swelling, orthopnea, PND.  He had no pain with eating or reflux.  He had no runny nose or sore throat, but did have chest congestion, cough and throat clearing.  No sick contacts.  All other systems negative except as just noted or noted in the history of present illness.  Past Medical History:  Diagnosis Date  . Arthritis    "mild arthritis in hip"  . CAD (coronary artery disease)   . CHF (congestive heart failure) ([place])   . Complication of anesthesia    "lungs filled up with fluid"   . Diabetes mellitus    Type II  . Glaucoma   . Heart murmur   . HTN (hypertension)   . Hypercholesterolemia   . LBP (low back pain)   . Legally blind     . Sleep apnea   . Staph infection    "from back surgery"  . Trigger finger   . Ulcer   . Wears glasses    "to protect cornea"    Past Surgical History:  Procedure Laterality Date  . CHOLECYSTECTOMY  09/15/2012   Procedure: LAPAROSCOPIC CHOLECYSTECTOMY WITH INTRAOPERATIVE CHOLANGIOGRAM;  Surgeon: Pedro Earls, MD;  Location: [place];  Service: General;  Laterality: N/A;  . EYE SURGERY Bilateral    cataracts  . INGUINAL HERNIA REPAIR     right  . LUMBAR FUSION    . LUMBAR LAMINECTOMY/DECOMPRESSION MICRODISCECTOMY N/A 10/26/2015   Procedure: RIGHT AND CENTRAL LUMBAR LAMINECTOMY L3-4, RIGHT L5-S1 LATERAL RECESS DECOMPRESSION;  Surgeon: Jessy Oto, MD;  Location: [place];  Service: Orthopedics;  Laterality: N/A;  . NECK SURGERY    . peptic ulcer dz surgery  pt was in his 20s   bleeding ulcer.   . Prosthetic Cornea placement, Right eye  2007   [place] Right 10/26/2015   Procedure: RELEASE TRIGGER FINGER RIGHT THUMB;  Surgeon: Jessy Oto, MD;  Location: [place];  Service: Orthopedics;  Laterality: Right;    Social History: Patient lives with his wife.  Patient walks unassisted.  He does not smoke or drink.  Allergies  Allergen Reactions  . Furosemide     pancreatitis  . Diamox [Acetazolamide] Itching  . Lisinopril Rash    Family history: family history includes Breast cancer in his mother; Colon cancer in his mother and other; Diabetes in his brother, mother, and sister; Hypertension in his father and mother.  Prior to Admission medications   Medication Sig Start Date End Date Taking? Authorizing Provider  aspirin EC 81 MG tablet Take 1 tablet (81 mg total) by mouth daily. 05/22/16   Scott T Kathlen Mody, PA-C  B-D INS SYR ULTRAFINE 1CC/30G 30G X 1/2" 1 ML MISC USE AS DIRECTED WITH INSULIN VIALS 03/25/16   Historical Provider, MD  Blood Glucose Calibration (ONETOUCH VERIO) SOLN 1 Act by In Vitro route 2 (two) times daily. 08/22/15   [name], MD   Blood Glucose Monitoring Suppl (PRODIGY VOICE BLOOD GLUCOSE) W/DEVICE KIT Use to check blood sugars twice a day Dx e11.9 10/30/15   Geradine Girt, DO  carvedilol (COREG) 12.5 MG tablet TAKE 1 TABLET BY MOUTH TWICE A DAY WITH A MEAL 07/23/16   [name], MD  ezetimibe (ZETIA) 10 MG tablet Take 1 tablet (10 mg total) by mouth daily. 08/22/15   [name], MD  gabapentin (NEURONTIN) 300 MG capsule TAKE 1 CAPSULE (300 MG TOTAL) BY MOUTH 3 (THREE) TIMES DAILY. 07/23/15   Lyndal Pulley, DO  glucose blood [facility] VERIO) test strip Use BID 08/22/15   [name], MD  glucose blood test strip 1 each by Other route 2 (two) times daily. Use to check blood sugars twice a day Dx E11.9 08/08/15   [name], MD  insulin lispro protamine-lispro (HUMALOG 75/25 MIX) (75-25) 100 UNIT/ML SUSP injection Inject 45-85 Units into the skin 2 (two) times daily with a meal. Use 85 units every morning and use 45 units every evening 08/21/15   [name], MD  INSULIN SYRINGE 1CC/29G (B-D INSULIN SYRINGE) 29G X 1/2" 1 ML MISC Use as directed with insulin vials 08/21/15   [name], MD  isosorbide mononitrate (IMDUR) 120 MG 24 hr tablet TAKE 1 TABLET (120 MG TOTAL) BY MOUTH DAILY. 05/22/16   Scott Joylene Draft, PA-C  JANUMET 50-1000 MG tablet TAKE 1 TABLET BY MOUTH TWICE A DAY WITH A MEAL 02/05/16   [name], MD  losartan (COZAAR) 50 MG tablet Take 1 tablet (50 mg total) by mouth daily. 05/22/16   Liliane Shi, PA-C  omega-3 acid ethyl esters (LOVAZA) 1 g capsule TAKE 2 CAPSULES (2 G TOTAL) BY MOUTH 2 (TWO) TIMES DAILY. 12/19/15   [name], MD  oxyCODONE-acetaminophen (PERCOCET/ROXICET) 5-325 MG tablet Take 2 tablets by mouth every 4 (four) hours as needed for severe pain. 04/25/16   Nona Dell, PA-C  polyethylene glycol [facility] / GLYCOLAX) packet Take 17 g by mouth daily as needed for mild constipation. 10/30/15   Jessy Oto, MD  pravastatin (PRAVACHOL) 40 MG tablet TAKE 1 TABLET (40 MG  TOTAL) BY MOUTH DAILY. DAILY 05/22/16   Liliane Shi, PA-C  PRODIGY LANCETS 28G MISC Use to check blood sugars twice a day Dx e11.9 08/08/15   [name], MD  tamsulosin (FLOMAX) 0.4 MG CAPS capsule Take 1 capsule (0.4 mg total) by mouth daily. 10/30/15   Jessy Oto, MD  traMADol (ULTRAM) 50 MG tablet TAKE 1 TO 2 TABLETS BY MOUTH EVERY 4 TO 6 HOURS AS NEEDED FOR PAIN (INS PAYS FOR  8 TABS PER DAY) 08/08/15   Historical Provider, MD       Physical Exam: BP 153/82   Pulse 63   Temp 99.9 F (37.7 C) (Oral)   Resp 16   Ht 5' 11"  (1.803 m)   Wt 108.9 kg (240 lb)   SpO2 95%   BMI 33.47 kg/m  General appearance: Well-developed, obese adult male, alert and in no acute distress.  Blind, lying in bed comfortably.   Eyes: Closed, cloudy.    ENT: No nasal deformity, discharge, or epistaxis.  OP moist without lesions.   Skin: Warm and dry.   Cardiac: RRR, nl S1-S2, no murmurs appreciated.  Capillary refill is brisk.  JVP not visible due to habitus.  No LE edema.  Radial and DP pulses 2+ and symmetric.  No carotid bruits. Respiratory: Normal respiratory rate and rhythm.  CTAB without rales or wheezes. GI: Abdomen soft without rigidity.  No TTP. No ascites, distension.   MSK: No deformities or effusions.   Pain not reproduced with palpation of precordium.  No pain with arm movement. Neuro: Sensorium intact and responding to questions, attention normal.  Speech is fluent.  Moves all extremities equally and with normal coordination.    Psych: Behavior appropriate.  Affect normal.  No evidence of aural or visual hallucinations or delusions.       Labs on Admission:  The metabolic panel shows normal electrolytes and renal function. The complete blood count shows no leukocytosis, anemia or thrombocytopenia.  Transaminases and bilirubin are normal. The initial troponin is negative.  Radiological Exams on Admission: Personally reviewed: Dg Chest 2 View  Result Date: 07/27/2016 CLINICAL DATA:   Dyspnea beginning at 10 p.m. Throat congestion, chest pain. History of hypertension, diabetes and coronary artery stents. EXAM: CHEST  2 VIEW COMPARISON:  Chest radiograph October 28, 2010 FINDINGS: Cardiomediastinal silhouette is normal. The lungs are clear without pleural effusions or focal consolidations. Trachea projects midline and there is no pneumothorax. Soft tissue planes and included osseous structures are non-suspicious. Mild degenerative change of the thoracic spine and LEFT shoulder. Surgical clips in the included right abdomen compatible with cholecystectomy. IMPRESSION: No active cardiopulmonary disease. Electronically Signed   By: Elon Alas M.D.   On: 07/27/2016 00:01    EKG: Independently reviewed. Rate 60, Qtc 399, no ST changes.    Assessment/Plan 1. Chest pain: This is new.  The patient has HEART score of 5. He claims pain is typical of his previous angina.  Other potential causes of chest pain (PE, dissection, pancreatitis, pneumonia/effusion, pericarditis) are doubted. Bronchitis is also likely, given presence of cough, pleuritic pain, and "burning" character.   We have been asked to admit the patient for observation and etiology consultation with Cardiology tomorrow.  Aspirin was given. -Serial troponins are ordered -Telemetry -Echocardiogram ordered -Check BNP -Consult to cardiology, appreciate recommendations    2. IDDM:  On 70/30 at home -Sliding scale corrections q4hrs while NPO for stress  3. HTN and secondary prevention:  -Continue Zetia, baby aspirin, ARB, and statin -Continue Imdur  4. Chronic pain:  -Continue home Percocet or tramadol PRN for pain -Continue gabapentin  5. BPH:  -Continue tamsulosin      DVT prophylaxis: None, outpatient status Diet: NPO after 4am for anticipated stress testing Code Status: Full  Family Communication: Wife at bedside  Disposition Plan: Anticipate overnight observation for arrhythmia on telemetry, serial  troponins and subsequent risk stratification by Cardiology.  If testing negative, home after. Consults called: None overnight  Admission status: Telemetry, OBS   Medical decision making: Patient seen at 3:28 AM on 07/27/2016.  The patient was discussed with Dr. Leonides Schanz. What exists of the patient's chart was reviewed in depth.  Clinical condition: stable.      Edwin Dada [facility] Pager [phone]

## 2016-07-27 NOTE — Consult Note (Signed)
CARDIOLOGY CONSULT NOTE   Patient ID: [name] MRN: [id number] DOB/AGE: [birth year] 65 y.o.  Admit date: 07/27/2016  Requesting Physician: Dr. Tyrell Antonio Primary Physician:   [name], MD Primary Cardiologist:   Dr. Marlou Porch Reason for Consultation:   Chest pain  HPI: [name] is a 65 y.o. male with a history of DMT2, HTN, HLD, obesity, blindness, OSA, CAD s/p prior stenting to OM and LCx and history of non compliance who presented to to [facility] on late on 07/26/16 for evaluation of chest pain.   Last intervention was done in 11/08 with BMS to the proximal LCx. He has residual high-grade proximal RCA stenosis that has been treated medically. Last Myoview 9/14 was low risk with no ischemia. Last seen by Dr. Marlou Porch 1/16. He was then seen for follow up by Richardson Dopp PA-C on 05/22/16 and felt to be stable from a cardiac standpoint.   He was in his usual state of health until last week when he started to note increasing fatigue and increasing shortness of breath with exertion. On Friday 07/25/16 he started to note chest pain that worsened into Saturday. It was intermittent and described as a burning and aching pain. The chest pain became worse prompting him to seek evaluation in the [facility] ER. He has ruled out for MI and currently has dull aching chest pain. NTG did help the pain. No LE edema, orhopnea or PND. No dizziness or syncope.     Past Medical History:  Diagnosis Date  . Arthritis    "mild arthritis in hip"  . CAD (coronary artery disease)   . CHF (congestive heart failure) ([place])   . Complication of anesthesia    "lungs filled up with fluid"   . Diabetes mellitus    Type II  . Glaucoma   . Heart murmur   . HTN (hypertension)   . Hypercholesterolemia   . LBP (low back pain)   . Legally blind   . Sleep apnea   . Staph infection    "from back surgery"  . Trigger finger   . Ulcer   . Wears glasses    "to protect cornea"     Past Surgical History:  Procedure  Laterality Date  . CHOLECYSTECTOMY  09/15/2012   Procedure: LAPAROSCOPIC CHOLECYSTECTOMY WITH INTRAOPERATIVE CHOLANGIOGRAM;  Surgeon: Pedro Earls, MD;  Location: [place];  Service: General;  Laterality: N/A;  . EYE SURGERY Bilateral    cataracts  . INGUINAL HERNIA REPAIR     right  . LUMBAR FUSION    . LUMBAR LAMINECTOMY/DECOMPRESSION MICRODISCECTOMY N/A 10/26/2015   Procedure: RIGHT AND CENTRAL LUMBAR LAMINECTOMY L3-4, RIGHT L5-S1 LATERAL RECESS DECOMPRESSION;  Surgeon: Jessy Oto, MD;  Location: [place];  Service: Orthopedics;  Laterality: N/A;  . NECK SURGERY    . peptic ulcer dz surgery  pt was in his 20s   bleeding ulcer.   . Prosthetic Cornea placement, Right eye  2007   [place] Right 10/26/2015   Procedure: RELEASE TRIGGER FINGER RIGHT THUMB;  Surgeon: Jessy Oto, MD;  Location: [place];  Service: Orthopedics;  Laterality: Right;    Allergies  Allergen Reactions  . Furosemide     pancreatitis  . Diamox [Acetazolamide] Itching  . Lisinopril Rash    I have reviewed the patient's current medications . aspirin EC  81 mg Oral Daily  . ezetimibe  10 mg Oral Daily  . gabapentin  300  mg Oral TID  . insulin aspart  0-15 Units Subcutaneous Q4H  . isosorbide mononitrate  120 mg Oral Daily  . losartan  50 mg Oral Daily  . pravastatin  40 mg Oral Daily  . tamsulosin  0.4 mg Oral Daily     acetaminophen, gi cocktail, nitroGLYCERIN, ondansetron (ZOFRAN) IV, oxyCODONE-acetaminophen, traMADol  Prior to Admission medications   Medication Sig Start Date End Date Taking? Authorizing Provider  aspirin EC 81 MG tablet Take 1 tablet (81 mg total) by mouth daily. 05/22/16  Yes Scott T Kathlen Mody, PA-C  Blood Glucose Calibration (ONETOUCH VERIO) SOLN 1 Act by In Vitro route 2 (two) times daily. 08/22/15  Yes [name], MD  Blood Glucose Monitoring Suppl (PRODIGY VOICE BLOOD GLUCOSE) W/DEVICE KIT Use to check blood sugars twice a day Dx e11.9 10/30/15  Yes  Jessica U Vann, DO  carvedilol (COREG) 12.5 MG tablet TAKE 1 TABLET BY MOUTH TWICE A DAY WITH A MEAL 07/23/16  Yes [name], MD  ezetimibe (ZETIA) 10 MG tablet Take 1 tablet (10 mg total) by mouth daily. 08/22/15  Yes [name], MD  gabapentin (NEURONTIN) 300 MG capsule TAKE 1 CAPSULE (300 MG TOTAL) BY MOUTH 3 (THREE) TIMES DAILY. Patient taking differently: TAKE 1 CAPSULE (300 MG TOTAL) BY MOUTH 3 (THREE) TIMES DAILY AS NEEDED FOR PAIN 07/23/15  Yes Lyndal Pulley, DO  glucose blood (ONETOUCH VERIO) test strip Use BID 08/22/15  Yes [name], MD  glucose blood test strip 1 each by Other route 2 (two) times daily. Use to check blood sugars twice a day Dx E11.9 08/08/15  Yes [name], MD  insulin lispro protamine-lispro (HUMALOG 75/25 MIX) (75-25) 100 UNIT/ML SUSP injection Inject 45-85 Units into the skin 2 (two) times daily with a meal. Use 85 units every morning and use 45 units every evening Patient taking differently: Inject 45-65 Units into the skin 2 (two) times daily with a meal. Use 45 units every morning and use 65 units every evening 08/21/15  Yes [name], MD  INSULIN SYRINGE 1CC/29G (B-D INSULIN SYRINGE) 29G X 1/2" 1 ML MISC Use as directed with insulin vials 08/21/15  Yes [name], MD  isosorbide mononitrate (IMDUR) 120 MG 24 hr tablet TAKE 1 TABLET (120 MG TOTAL) BY MOUTH DAILY. 05/22/16  Yes Scott T Weaver, PA-C  JANUMET 50-1000 MG tablet TAKE 1 TABLET BY MOUTH TWICE A DAY WITH A MEAL 02/05/16  Yes [name], MD  losartan (COZAAR) 50 MG tablet Take 1 tablet (50 mg total) by mouth daily. 05/22/16  Yes Liliane Shi, PA-C  omega-3 acid ethyl esters (LOVAZA) 1 g capsule TAKE 2 CAPSULES (2 G TOTAL) BY MOUTH 2 (TWO) TIMES DAILY. 12/19/15  Yes [name], MD  oxyCODONE-acetaminophen (PERCOCET/ROXICET) 5-325 MG tablet Take 2 tablets by mouth every 4 (four) hours as needed for severe pain. 04/25/16  Yes Chesley Noon Nadeau, PA-C  pravastatin (PRAVACHOL) 40 MG  tablet TAKE 1 TABLET (40 MG TOTAL) BY MOUTH DAILY. DAILY 05/22/16  Yes Liliane Shi, PA-C  PRODIGY LANCETS 28G MISC Use to check blood sugars twice a day Dx e11.9 08/08/15  Yes [name], MD  tamsulosin (FLOMAX) 0.4 MG CAPS capsule Take 1 capsule (0.4 mg total) by mouth daily. 10/30/15  Yes Jessy Oto, MD  traMADol (ULTRAM) 50 MG tablet TAKE 1 TO 2 TABLETS BY MOUTH EVERY 4 TO 6 HOURS AS NEEDED FOR PAIN (INS PAYS  FOR 8 TABS PER DAY) 08/08/15  Yes Historical Provider, MD     Social History   Social History  . Marital status: Married    Spouse name: N/A  . Number of children: N/A  . Years of education: N/A   Occupational History  . disabled     blind   Social History Main Topics  . Smoking status: Never Smoker  . Smokeless tobacco: Never Used  . Alcohol use No  . Drug use: No  . Sexual activity: Not Currently   Other Topics Concern  . Not on file   Social History Narrative   Occupation: disabled, blind   Married   Regular Exercise-no          Family Status  Relation Status  . Mother Deceased  . Father Deceased  . Sister Alive  . Brother Alive  . Other   . Sister   . Brother    Family History  Problem Relation Age of Onset  . Breast cancer Mother   . Colon cancer Mother   . Hypertension Mother   . Diabetes Mother   . Hypertension Father   . Colon cancer Other     Elevated Risk for  . Diabetes Sister   . Diabetes Brother     ROS:  Full 14 point review of systems complete and found to be negative unless listed above.  Physical Exam: Blood pressure (!) 154/68, pulse 63, temperature 97.6 F (36.4 C), temperature source Oral, resp. rate 16, height _0  (1.778 m), weight 228 lb 8 oz (103.6 kg), SpO2 98 %.  General: Well developed, well nourished, male in no acute distress Head: Eyes PERRLA, No xanthomas.   Normocephalic and atraumatic, oropharynx without edema or exudate.   Lungs: CTAB Heart: HRRR S1 S2, no rub/gallop, Heart regular rate and rhythm with  S1, S2 No murmur. pulses are 2+ extrem.   Neck: No carotid bruits. No lymphadenopathy. No JVD. Abdomen: Bowel sounds present, abdomen soft and non-tender without masses or hernias noted. Msk:  No spine or cva tenderness. No weakness, no joint deformities or effusions. Extremities: No clubbing or cyanosis. No LE edema.  Neuro: Alert and oriented X 3. No focal deficits noted. Psych:  Good affect, responds appropriately Skin: No rashes or lesions noted.  Labs:   Lab Results  Component Value Date   WBC 6.4 07/26/2016   HGB 12.6 (L) 07/26/2016   HCT 39.1 07/26/2016   MCV 72.4 (L) 07/26/2016   PLT 228 07/26/2016   No results for input(s): INR in the last 72 hours.  Recent Labs Lab 07/26/16 2334  NA 136  K 3.7  CL 100*  CO2 28  BUN 16  CREATININE 1.23  CALCIUM 9.4  PROT 7.2  BILITOT 0.7  ALKPHOS 49  ALT 17  AST 18  GLUCOSE 83  ALBUMIN 3.8   Magnesium  Date Value Ref Range Status  09/15/2012 1.9 1.5 - 2.5 mg/dL Final    Recent Labs  07/27/16 0608  TROPONINI <0.03    Recent Labs  07/26/16 2343  TROPIPOC 0.00    Lab Results  Component Value Date   CHOL 171 04/09/2016   HDL 28.20 (L) 04/09/2016   LDLCALC 117 (H) 04/09/2016   TRIG 127.0 04/09/2016    TSH  Date/Time Value Ref Range Status  04/09/2016 10:41 AM 1.50 0.35 - 4.50 uIU/mL Final   Vitamin B-12  Date/Time Value Ref Range Status  04/09/2016 10:41 AM 410 211 - 911 pg/mL Final  Folate  Date/Time Value Ref Range Status  04/09/2016 10:41 AM 12.2 >5.9 ng/mL Final   Ferritin  Date/Time Value Ref Range Status  03/31/2012 10:03 AM 57.2 22.0 - 322.0 ng/mL Final   Iron  Date/Time Value Ref Range Status  03/31/2012 10:03 AM 72 42 - 165 ug/dL Final    Echo: pending today.   Myoview 9/14 Overall Impression: Normal stress nuclear study. There is no evidence of ischemia. LV Ejection Fraction: 63%. LV Wall Motion: NL LV Function; NL Wall Motion.  LHC 11/08 LM no significant disease LAD  proximal 30%, distal 80% at the apex LCx proximal 95%, second branching segment of OM1 70% proximal, OM 2 proximal 60% distal to previously placed stent RCA proximal/mid 95% EF 65% PCI: BMS to the proximal LCx   IMPRESSION:  1. A 95% proximal circumflex stenosis - plan PCI, Dr. Rodell Perna,      bare metal stent 3.5 x 20 mm.  2. Proximal 95% stenosis of the right coronary artery in a diffusely      diseased vessel.  3. Distal LAD 80% stenosis at the apex.  4. Normal left ventricular ejection fraction estimated 65% with no      regional wall motion abnormalities.  5. After plain PCI, femoral angiogram was performed and Angio-Seal was      used as a arteriotomy closure device.  IMPRESSION:  1. A 95% proximal circumflex stenosis - plan PCI, Dr. Rodell Perna,      bare metal stent 3.5 x 20 mm.  2. Proximal 95% stenosis of the right coronary artery in a diffusely      diseased vessel.  3. Distal LAD 80% stenosis at the apex.  4. Normal left ventricular ejection fraction estimated 65% with no      regional wall motion abnormalities.  5. After plain PCI, femoral angiogram was performed and Angio-Seal was      used as a arteriotomy closure device.   ECG:  NSR HR 68  Radiology:  Dg Chest 2 View  Result Date: 07/27/2016 CLINICAL DATA:  Dyspnea beginning at 10 p.m. Throat congestion, chest pain. History of hypertension, diabetes and coronary artery stents. EXAM: CHEST  2 VIEW COMPARISON:  Chest radiograph October 28, 2010 FINDINGS: Cardiomediastinal silhouette is normal. The lungs are clear without pleural effusions or focal consolidations. Trachea projects midline and there is no pneumothorax. Soft tissue planes and included osseous structures are non-suspicious. Mild degenerative change of the thoracic spine and LEFT shoulder. Surgical clips in the included right abdomen compatible with cholecystectomy. IMPRESSION: No active cardiopulmonary disease. Electronically Signed   By: Elon Alas M.D.   On: 07/27/2016 00:01    ASSESSMENT AND PLAN:    Principal Problem:   Chest pain Active Problems:   Type II diabetes mellitus with complication, uncontrolled (HCC)   Coronary atherosclerosis   Congestive heart failure (HCC)   HYPERTENSION, BENIGN ESSENTIAL   Chronic pain syndrome   Sleep apnea   Chest pain: chest pain is reminiscent of his previous angina prior to previous stenting. Last cath in 2008 with PCI to LCx and high grade RCA lesion with diffuse disease treated medically. Troponin neg x1 and ECG with no acute ST or TW changes. Will plan for a nuclear stress test today. 2D ECHO completed today but not read.   CAD: continue ASA, BB and statin  HTN: Blood pressure elevated currently on current regimen which imdur 152m daily, losartan 548mdaily. Coreg 12.45m24mID not re ordered on admission. Will  restart this and watch BP  HLD:  This is managed by primary care. LDL in 4/17 was 117. Goal LDL is less than 70. He will continue Zetia and pravastatin and follow-up with primary care.  Diabetes: well controlled. Hemoglobin A1c 6.7 in 4/17.  Signed: Angelena Form, PA-C 07/27/2016 8:55 AM  Pager [phone]  Co-Sign MD  The patient was seen and examined, and I agree with the history, physical exam, assessment and plan as documented above which has been discussed with K. Grandville Silos, with modifications as noted below. Pt admitted with chest pain which began Friday night and persisted until yesterday. Denies any symptoms at present. Has CAD with prior PCI. Most recent cath report reviewed with distal LAD disease and diffuse, tortuous RCA disease. Says symptoms are reminiscent of symptoms prior to PCI. ECG unremarkable, troponins normal. Will plan for nuclear MPI study today. If no ischemia, given that he is on high dose long-acting nitrates, would consider addition of Ranexa.   Kate Sable, MD, [facility]  07/27/2016 9:58 AM

## 2016-07-28 ENCOUNTER — Observation Stay (HOSPITAL_BASED_OUTPATIENT_CLINIC_OR_DEPARTMENT_OTHER): Payer: Medicare Other

## 2016-07-28 ENCOUNTER — Encounter (HOSPITAL_COMMUNITY): Payer: Self-pay | Admitting: General Practice

## 2016-07-28 DIAGNOSIS — I509 Heart failure, unspecified: Secondary | ICD-10-CM

## 2016-07-28 DIAGNOSIS — E118 Type 2 diabetes mellitus with unspecified complications: Secondary | ICD-10-CM

## 2016-07-28 DIAGNOSIS — G894 Chronic pain syndrome: Secondary | ICD-10-CM | POA: Diagnosis not present

## 2016-07-28 DIAGNOSIS — Z794 Long term (current) use of insulin: Secondary | ICD-10-CM

## 2016-07-28 DIAGNOSIS — I25118 Atherosclerotic heart disease of native coronary artery with other forms of angina pectoris: Secondary | ICD-10-CM | POA: Diagnosis not present

## 2016-07-28 DIAGNOSIS — E1165 Type 2 diabetes mellitus with hyperglycemia: Secondary | ICD-10-CM

## 2016-07-28 DIAGNOSIS — R0789 Other chest pain: Secondary | ICD-10-CM | POA: Diagnosis not present

## 2016-07-28 LAB — GLUCOSE, CAPILLARY
Glucose-Capillary: 129 mg/dL — ABNORMAL HIGH (ref 65–99)
Glucose-Capillary: 155 mg/dL — ABNORMAL HIGH (ref 65–99)

## 2016-07-28 LAB — ECHOCARDIOGRAM COMPLETE
HEIGHTINCHES: 70 in
WEIGHTICAEL: 3593.6 [oz_av]

## 2016-07-28 MED ORDER — INSULIN LISPRO PROT & LISPRO (75-25 MIX) 100 UNIT/ML ~~LOC~~ SUSP: 45.0000 [IU] | Freq: Two times a day (BID) | SUBCUTANEOUS | 0 refills | Status: DC

## 2016-07-28 MED ORDER — RANOLAZINE ER 500 MG PO TB12: 500.0000 mg | ORAL_TABLET | Freq: Two times a day (BID) | ORAL | 0 refills | Status: DC

## 2016-07-28 NOTE — H&P (Signed)
Patient having Echo at this time ,unable to finish medical history

## 2016-07-28 NOTE — Progress Notes (Signed)
Patient Name: [name] Date of Encounter: 07/28/2016  Principal Problem:   Chest pain Active Problems:   Type II diabetes mellitus with complication, uncontrolled ([place])   Coronary atherosclerosis   Congestive heart failure (HCC)   HYPERTENSION, BENIGN ESSENTIAL   Chronic pain syndrome   Sleep apnea   Primary Cardiologist: Dr. Marlou Porch Patient Profile:  [name] is a 65 y.o. male with a history of DMT2, HTN, HLD, obesity, blindness, OSA, CAD s/p prior stenting to OM and LCx and history of non compliance who presented to to [facility] on 07/26/16 for evaluation of chest pain.   SUBJECTIVE: Feels great, denies chest pain and SOB.   OBJECTIVE Vitals:   07/27/16 1201 07/27/16 1414 07/27/16 2120 07/28/16 0555  BP:  (!) 106/36 (!) 115/48 (!) 95/58  Pulse: 74 64 69 (!) 57  Resp:  18 18 18   Temp:  98.2 F (36.8 C) 97.9 F (36.6 C) 98.3 F (36.8 C)  TempSrc:  Oral Oral Oral  SpO2:  99% 100% 97%  Weight:    224 lb 9.6 oz (101.9 kg)  Height:        Intake/Output Summary (Last 24 hours) at 07/28/16 0830 Last data filed at 07/27/16 1943  Gross per 24 hour  Intake              480 ml  Output              375 ml  Net              105 ml   Filed Weights   07/26/16 2323 07/27/16 0516 07/28/16 0555  Weight: 240 lb (108.9 kg) 228 lb 8 oz (103.6 kg) 224 lb 9.6 oz (101.9 kg)    PHYSICAL EXAM General: Well developed, well nourished, male in no acute distress. Head: Normocephalic, atraumatic.  Neck: Supple without bruits, JVD. Lungs:  Resp regular and unlabored, CTA. Heart: RRR, S1, S2, no S3, S4, or murmur; no rub. Abdomen: Soft, non-tender, non-distended, BS + x 4.  Extremities: No clubbing, cyanosis, edema.  Neuro: Alert and oriented X 3. Moves all extremities spontaneously. Psych: Normal affect.  LABS: CBC: Recent Labs  07/26/16 2334  WBC 6.4  NEUTROABS 3.2  HGB 12.6*  HCT 39.1  MCV 72.4*  PLT [id number]   Basic Metabolic Panel: Recent Labs  07/26/16 2334  NA 136    K 3.7  CL 100*  CO2 28  GLUCOSE 83  BUN 16  CREATININE 1.23  CALCIUM 9.4   Liver Function Tests: Recent Labs  07/26/16 2334  AST 18  ALT 17  ALKPHOS 49  BILITOT 0.7  PROT 7.2  ALBUMIN 3.8   Cardiac Enzymes: Recent Labs  07/27/16 0929 07/27/16 1424 07/27/16 1916  TROPONINI <0.03 <0.03 <0.03    Recent Labs  07/26/16 2343  TROPIPOC 0.00   BNP:  B Natriuretic Peptide  Date/Time Value Ref Range Status  07/27/2016 06:30 AM 4.1 0.0 - 100.0 pg/mL Final     Current Facility-Administered Medications:  .  acetaminophen (TYLENOL) tablet 650 mg, 650 mg, Oral, Q4H PRN, Edwin Dada, MD .  aspirin EC tablet 81 mg, 81 mg, Oral, Daily, Edwin Dada, MD, 81 mg at 07/27/16 1018 .  carvedilol (COREG) tablet 12.5 mg, 12.5 mg, Oral, BID WC, Eileen Stanford, PA-C, 12.5 mg at 07/27/16 1630 .  ezetimibe (ZETIA) tablet 10 mg, 10 mg, Oral, Daily, Edwin Dada, MD, 10 mg at 07/27/16 1018 .  gabapentin (  NEURONTIN) capsule 300 mg, 300 mg, Oral, TID, Edwin Dada, MD, 300 mg at 07/27/16 2149 .  gi cocktail (Maalox,Lidocaine,Donnatal), 30 mL, Oral, QID PRN, Edwin Dada, MD, 30 mL at 07/27/16 0553 .  insulin aspart (novoLOG) injection 0-15 Units, 0-15 Units, Subcutaneous, Q4H, Edwin Dada, MD, 2 Units at 07/28/16 [phone] .  isosorbide mononitrate (IMDUR) 24 hr tablet 120 mg, 120 mg, Oral, Daily, Edwin Dada, MD, 120 mg at 07/27/16 1018 .  losartan (COZAAR) tablet 50 mg, 50 mg, Oral, Daily, Edwin Dada, MD, 50 mg at 07/27/16 1018 .  nitroGLYCERIN (NITROSTAT) SL tablet 0.4 mg, 0.4 mg, Sublingual, Q5 min PRN, Belkys A Regalado, MD .  ondansetron (ZOFRAN) injection 4 mg, 4 mg, Intravenous, Q6H PRN, Edwin Dada, MD .  oxyCODONE-acetaminophen (PERCOCET/ROXICET) 5-325 MG per tablet 2 tablet, 2 tablet, Oral, Q4H PRN, Edwin Dada, MD .  pravastatin (PRAVACHOL) tablet 40 mg, 40 mg, Oral, Daily, Edwin Dada, MD, 40 mg at 07/27/16 1018 .  ranolazine (RANEXA) 12 hr tablet 500 mg, 500 mg, Oral, BID, Almyra Deforest, PA, 500 mg at 07/27/16 2149 .  tamsulosin (FLOMAX) capsule 0.4 mg, 0.4 mg, Oral, Daily, Edwin Dada, MD, 0.4 mg at 07/27/16 1018 .  traMADol (ULTRAM) tablet 50 mg, 50 mg, Oral, Q6H PRN, Edwin Dada, MD, 50 mg at 07/27/16 0553    TELE:   NSR     ECG: NSR, low voltage QRS.   Radiology/Studies: Dg Chest 2 View  Result Date: 07/27/2016 CLINICAL DATA:  Dyspnea beginning at 10 p.m. Throat congestion, chest pain. History of hypertension, diabetes and coronary artery stents. EXAM: CHEST  2 VIEW COMPARISON:  Chest radiograph October 28, 2010 FINDINGS: Cardiomediastinal silhouette is normal. The lungs are clear without pleural effusions or focal consolidations. Trachea projects midline and there is no pneumothorax. Soft tissue planes and included osseous structures are non-suspicious. Mild degenerative change of the thoracic spine and LEFT shoulder. Surgical clips in the included right abdomen compatible with cholecystectomy. IMPRESSION: No active cardiopulmonary disease. Electronically Signed   By: Elon Alas M.D.   On: 07/27/2016 00:01   Nm Myocar Multi W/spect W/wall Motion / Ef  Result Date: 07/27/2016 CLINICAL DATA:  65 year old male with chest pain and shortness of breath EXAM: MYOCARDIAL IMAGING WITH SPECT (REST AND PHARMACOLOGIC-STRESS) GATED LEFT VENTRICULAR WALL MOTION STUDY LEFT VENTRICULAR EJECTION FRACTION TECHNIQUE: Standard myocardial SPECT imaging was performed after resting intravenous injection of 10 mCi Tc-77m tetrofosmin. Subsequently, intravenous infusion of Lexiscan was performed under the supervision of the Cardiology staff. At peak effect of the drug, 30 mCi Tc-56m tetrofosmin was injected intravenously and standard myocardial SPECT imaging was performed. Quantitative gated imaging was also performed to evaluate left ventricular wall motion, and estimate  left ventricular ejection fraction. COMPARISON:  Prior chest x-ray 07/26/2016 FINDINGS: Perfusion: No decreased activity in the left ventricle on stress imaging to suggest reversible ischemia or infarction. Wall Motion: Normal left ventricular wall motion. No left ventricular dilation. Left Ventricular Ejection Fraction: 56 % End diastolic volume 62 ml End systolic volume 27 ml IMPRESSION: 1. No reversible ischemia or infarction. 2. Normal left ventricular wall motion. 3. Left ventricular ejection fraction 56% 4. Non invasive risk stratification*: Low *2012 Appropriate Use Criteria for Coronary Revascularization Focused Update: J Am Coll Cardiol. [id number]. http://content.airportbarriers.com.aspx?articleid=1201161 Electronically Signed   By: Jacqulynn Cadet M.D.   On: 07/27/2016 15:36     Current Medications:  . aspirin EC  81 mg Oral Daily  .  carvedilol  12.5 mg Oral BID WC  . ezetimibe  10 mg Oral Daily  . gabapentin  300 mg Oral TID  . insulin aspart  0-15 Units Subcutaneous Q4H  . isosorbide mononitrate  120 mg Oral Daily  . losartan  50 mg Oral Daily  . pravastatin  40 mg Oral Daily  . ranolazine  500 mg Oral BID  . tamsulosin  0.4 mg Oral Daily      ASSESSMENT AND PLAN: Principal Problem:   Chest pain Active Problems:   Type II diabetes mellitus with complication, uncontrolled (HCC)   Coronary atherosclerosis   Congestive heart failure (HCC)   HYPERTENSION, BENIGN ESSENTIAL   Chronic pain syndrome   Sleep apnea  1. Chest pain: Chest pain was reminiscent of his previous angina prior to stenting. Last cath in 2008 with PCI to LCx and high grade RCA lesion with diffuse disease treated medically. Troponin neg x3 and ECG with no acute ST or TW changes.  Lexiscan Myoview showed no reversible ischemia or infarction. Echo pending.   Ranexa was added to medication regimen yesterday. Tolerating well, continue same. We will follow up with him in 2-3 weeks. Appointment made.     2. CAD: continue ASA, BB and statin.   3. HTN: Blood pressure well controlled on current regimen of Imdur 120mg  daily, losartan 50mg  daily and Coreg 12.5mg  BID.   4. HLD:  This is managed by primary care. LDL in 4/17 was 117. Goal LDL is less than 70. He will continue Zetia and pravastatin and follow-up with primary care.  5. Diabetes: well controlled. Hemoglobin A1c 6.7 in 4/17.    Signed, Arbutus Leas , NP 8:30 AM 07/28/2016 Pager [phone]  I have personally seen and examined this patient with Jettie Booze, NP. I agree with the assessment and plan as outlined above.  He is known to have CAD with prior stenting of the circumflex with diffuse disease in the RCA in 2008, not favorable for PCI. Also distal LAD with diffuse disease. He has been managed medically since then. Admitted with chest pain. Troponin negative. Nuclear stress test without ischemia. Echo pending today. Exam with clear lungs, RRR, no LE edema. Labs reviewed. Ranexa has been added. If echo is ok, can d/c home today and f/u with Dr. Marlou Porch in 2-3 weeks.   Lauree Chandler 07/28/2016 9:05 AM

## 2016-07-28 NOTE — Discharge Summary (Addendum)
Physician Discharge Summary  [name] RSW:[id number] DOB: [birth year] DOA: 07/27/2016  PCP: [name], MD  Admit date: 07/27/2016 Discharge date: 07/28/2016  Admitted From: Home  Disposition: Home   Recommendations for Outpatient Follow-up:  1. Follow up with PCP in 1-2 weeks 2. Please obtain BMP/CBC in one week 3. Follow ECHO results.    Home Health: no  Discharge Condition: stable.  CODE STATUS: full code./  Diet recommendation: Heart Healthy  Brief/Interim Summary: Brief Narrative: [place] 65 y.o.malewith a past medical history significant for blindness, IDDM, CAD s/p PCI, HTN, and CHFwho presents with chest pain and SOB on exertion for several days.  The patient was in his usual state of health until about 2 days ago when he started to develop shortness of breath associated with a "burning" chest pain, moderate intensity, central or bilateral in the chest that was similar to his previous angina he thinks, from when he had his stents. He thought it would go away but it didn't so he came tonight to the ER.     Assessment & Plan:     Chest pain   Type II diabetes mellitus with complication, uncontrolled (HCC)   Coronary atherosclerosis   Congestive heart failure (HCC)   HYPERTENSION, BENIGN ESSENTIAL   Chronic pain syndrome   Sleep apnea  1. Chest pain: The patient has HEART score of 5. He claims pain is typical of his previous angina.  -Follow troponin trend.  -Echocardiogram pending at time of discharge  -Cardiology consulted.  -EKG with low voltage  -continue with Zetia, Imdur, aspirin. PRN nitroglycerin.  started on Ranexa, tolerating med so fa,.  Stress test low risk.   2. IDDM: On 70/30 at home, resume at discharge. Patient advised to reduce dose if cbg low.  Had hypoglycemia with CBG at 66  3. HTN and secondary prevention: -Continue Zetia, baby aspirin, ARB, and statin -Continue Imdur  4. Chronic pain: -Continue home  Percocet or tramadol PRN for pain -Continue gabapentin  5. BPH: -Continue tamsulosin    Discharge Diagnoses:  Principal Problem:   Chest pain Active Problems:   Type II diabetes mellitus with complication, uncontrolled (HCC)   Coronary atherosclerosis   Congestive heart failure (HCC)   HYPERTENSION, BENIGN ESSENTIAL   Chronic pain syndrome   Sleep apnea    Discharge Instructions  Discharge Instructions    Diet - low sodium heart healthy    Complete by:  As directed   Diet Carb Modified    Complete by:  As directed   Increase activity slowly    Complete by:  As directed       Medication List    TAKE these medications   aspirin EC 81 MG tablet Take 1 tablet (81 mg total) by mouth daily.   carvedilol 12.5 MG tablet Commonly known as:  COREG TAKE 1 TABLET BY MOUTH TWICE A DAY WITH A MEAL   ezetimibe 10 MG tablet Commonly known as:  ZETIA Take 1 tablet (10 mg total) by mouth daily.   gabapentin 300 MG capsule Commonly known as:  NEURONTIN TAKE 1 CAPSULE (300 MG TOTAL) BY MOUTH 3 (THREE) TIMES DAILY. What changed:  See the new instructions.   glucose blood test strip 1 each by Other route 2 (two) times daily. Use to check blood sugars twice a day Dx E11.9   glucose blood test strip Commonly known as:  ONETOUCH VERIO Use BID   insulin lispro protamine-lispro (75-25) 100 UNIT/ML Susp injection Commonly  known as:  HUMALOG 75/25 MIX Inject 45-65 Units into the skin 2 (two) times daily with a meal. Use 45 units every morning and use 65 units every evening What changed:  how much to take  additional instructions   INSULIN SYRINGE 1CC/29G 29G X 1/2" 1 ML Misc Commonly known as:  B-D INSULIN SYRINGE Use as directed with insulin vials   isosorbide mononitrate 120 MG 24 hr tablet Commonly known as:  IMDUR TAKE 1 TABLET (120 MG TOTAL) BY MOUTH DAILY.   JANUMET 50-1000 MG tablet Generic drug:  sitaGLIPtin-metformin TAKE 1 TABLET BY MOUTH TWICE A DAY WITH A  MEAL   losartan 50 MG tablet Commonly known as:  COZAAR Take 1 tablet (50 mg total) by mouth daily.   omega-3 acid ethyl esters 1 g capsule Commonly known as:  LOVAZA TAKE 2 CAPSULES (2 G TOTAL) BY MOUTH 2 (TWO) TIMES DAILY.   ONETOUCH VERIO Soln 1 Act by In Vitro route 2 (two) times daily.   oxyCODONE-acetaminophen 5-325 MG tablet Commonly known as:  PERCOCET/ROXICET Take 2 tablets by mouth every 4 (four) hours as needed for severe pain.   pravastatin 40 MG tablet Commonly known as:  PRAVACHOL TAKE 1 TABLET (40 MG TOTAL) BY MOUTH DAILY. DAILY   PRODIGY LANCETS 28G Misc Use to check blood sugars twice a day Dx e11.9   PRODIGY VOICE BLOOD GLUCOSE w/Device Kit Use to check blood sugars twice a day Dx e11.9   ranolazine 500 MG 12 hr tablet Commonly known as:  RANEXA Take 1 tablet (500 mg total) by mouth 2 (two) times daily.   tamsulosin 0.4 MG Caps capsule Commonly known as:  FLOMAX Take 1 capsule (0.4 mg total) by mouth daily.   traMADol 50 MG tablet Commonly known as:  ULTRAM TAKE 1 TO 2 TABLETS BY MOUTH EVERY 4 TO 6 HOURS AS NEEDED FOR PAIN (INS PAYS FOR 8 TABS PER DAY)      Follow-up Information    [name], MD Follow up in 1 week(s).   Specialty:  Internal Medicine Contact information: [address] [address] [place], MD Follow up in 2 week(s).   Specialty:  Cardiology Contact information: [address] [place] [address] [phone]          Allergies  Allergen Reactions  . Furosemide     pancreatitis  . Diamox [Acetazolamide] Itching  . Lisinopril Rash    Consultations:  Cardiology    Procedures/Studies: Dg Chest 2 View  Result Date: 07/27/2016 CLINICAL DATA:  Dyspnea beginning at 10 p.m. Throat congestion, chest pain. History of hypertension, diabetes and coronary artery stents. EXAM: CHEST  2 VIEW COMPARISON:  Chest radiograph October 28, 2010 FINDINGS:  Cardiomediastinal silhouette is normal. The lungs are clear without pleural effusions or focal consolidations. Trachea projects midline and there is no pneumothorax. Soft tissue planes and included osseous structures are non-suspicious. Mild degenerative change of the thoracic spine and LEFT shoulder. Surgical clips in the included right abdomen compatible with cholecystectomy. IMPRESSION: No active cardiopulmonary disease. Electronically Signed   By: Elon Alas M.D.   On: 07/27/2016 00:01   Nm Myocar Multi W/spect W/wall Motion / Ef  Result Date: 07/27/2016 CLINICAL DATA:  65 year old male with chest pain and shortness of breath EXAM: MYOCARDIAL IMAGING WITH SPECT (REST AND PHARMACOLOGIC-STRESS) GATED LEFT VENTRICULAR WALL MOTION STUDY LEFT VENTRICULAR EJECTION FRACTION TECHNIQUE: Standard myocardial SPECT imaging was  performed after resting intravenous injection of 10 mCi Tc-63mtetrofosmin. Subsequently, intravenous infusion of Lexiscan was performed under the supervision of the Cardiology staff. At peak effect of the drug, 30 mCi Tc-946metrofosmin was injected intravenously and standard myocardial SPECT imaging was performed. Quantitative gated imaging was also performed to evaluate left ventricular wall motion, and estimate left ventricular ejection fraction. COMPARISON:  Prior chest x-ray 07/26/2016 FINDINGS: Perfusion: No decreased activity in the left ventricle on stress imaging to suggest reversible ischemia or infarction. Wall Motion: Normal left ventricular wall motion. No left ventricular dilation. Left Ventricular Ejection Fraction: 56 % End diastolic volume 62 ml End systolic volume 27 ml IMPRESSION: 1. No reversible ischemia or infarction. 2. Normal left ventricular wall motion. 3. Left ventricular ejection fraction 56% 4. Non invasive risk stratification*: Low *2012 Appropriate Use Criteria for Coronary Revascularization Focused Update: J Am Coll Cardiol. 205456;25(6):389-373 http://content.[email or web address]?articleid=[email or web address] Electronically Signed   By: HeJacqulynn Cadet.D.   On: 07/27/2016 15:36    Subjective:   Discharge Exam: Vitals:   07/28/16 0555 07/28/16 0831  BP: (!) 95/58 133/74  Pulse: (!) 57 71  Resp: 18   Temp: 98.3 F (36.8 C)    Vitals:   07/27/16 1414 07/27/16 2120 07/28/16 0555 07/28/16 0831  BP: (!) 106/36 (!) 115/48 (!) 95/58 133/74  Pulse: 64 69 (!) 57 71  Resp: _0 Temp: 98.2 F (36.8 C) 97.9 F (36.6 C) 98.3 F (36.8 C)   TempSrc: Oral Oral Oral   SpO2: 99% 100% 97%   Weight:   101.9 kg (224 lb 9.6 oz)   Height:        General: Pt is alert, awake, not in acute distress Cardiovascular: RRR, S1/S2 +, no rubs, no gallops Respiratory: CTA bilaterally, no wheezing, no rhonchi Abdominal: Soft, NT, ND, bowel sounds + Extremities: no edema, no cyanosis    The results of significant diagnostics from this hospitalization (including imaging, microbiology, ancillary and laboratory) are listed below for reference.     Microbiology: No results found for this or any previous visit (from the past 240 hour(s)).   Labs: BNP (last 3 results)  Recent Labs  07/27/16 0630  BNP 4.1   Basic Metabolic Panel:  Recent Labs Lab 07/26/16 2334  NA 136  K 3.7  CL 100*  CO2 28  GLUCOSE 83  BUN 16  CREATININE 1.23  CALCIUM 9.4   Liver Function Tests:  Recent Labs Lab 07/26/16 2334  AST 18  ALT 17  ALKPHOS 49  BILITOT 0.7  PROT 7.2  ALBUMIN 3.8   No results for input(s): LIPASE, AMYLASE in the last 168 hours. No results for input(s): AMMONIA in the last 168 hours. CBC:  Recent Labs Lab 07/26/16 2334  WBC 6.4  NEUTROABS 3.2  HGB 12.6*  HCT 39.1  MCV 72.4*  PLT 228   Cardiac Enzymes:  Recent Labs Lab 07/27/16 0608 07/27/16 0929 07/27/16 1424 07/27/16 1916  TROPONINI <0.03 <0.03 <0.03 <0.03   BNP: Invalid input(s): POCBNP CBG:  Recent Labs Lab 07/27/16 0552 07/27/16 0632  07/27/16 1752 07/27/16 2134 07/28/16 0619  GLUCAP 68 152* 126* 140* 129*   D-Dimer No results for input(s): DDIMER in the last 72 hours. Hgb A1c No results for input(s): HGBA1C in the last 72 hours. Lipid Profile No results for input(s): CHOL, HDL, LDLCALC, TRIG, CHOLHDL, LDLDIRECT in the last 72 hours. Thyroid function studies No results for input(s): TSH, T4TOTAL, T3FREE, THYROIDAB in the last 72 hours.  Invalid input(s): FREET3 Anemia work up No results for input(s): VITAMINB12, FOLATE, FERRITIN, TIBC, IRON, RETICCTPCT in the last 72 hours. Urinalysis    Component Value Date/Time   COLORURINE YELLOW 04/09/2016 1041   APPEARANCEUR CLEAR 04/09/2016 1041   LABSPEC 1.015 04/09/2016 1041   PHURINE 7.0 04/09/2016 1041   GLUCOSEU NEGATIVE 04/09/2016 1041   HGBUR NEGATIVE 04/09/2016 [place] 04/09/2016 1041   BILIRUBINUR negative 05/25/2014 1322   KETONESUR NEGATIVE 04/09/2016 1041   PROTEINUR NEGATIVE 10/27/2015 2303   UROBILINOGEN 0.2 04/09/2016 1041   NITRITE NEGATIVE 04/09/2016 1041   LEUKOCYTESUR NEGATIVE 04/09/2016 1041   Sepsis Labs Invalid input(s): PROCALCITONIN,  WBC,  LACTICIDVEN Microbiology No results found for this or any previous visit (from the past 240 hour(s)).   Time coordinating discharge: Over 30 minutes  SIGNED:   Elmarie Shiley, MD  [facility] 07/28/2016, 8:34 AM Pager [phone]  If 7PM-7AM, please contact night-coverage [email or web address] Password [email or web address]

## 2016-07-28 NOTE — Progress Notes (Signed)
Pt has been discharged home with wife. IV was removed with no complications. CCMD was notified and telemetry box was removed. Pt and pt's wife received discharge instructions and all questions were answered. Pt received prescriptions and was instructed to get them filled at pharmacy. Pt left the unit via wheelchair and was accompanied by a Wilfrid Lund and pt's wife. Pt left with all of pt's belongings. Pt was in no distress at time of discharge.   Grant Fontana BSN, RN

## 2016-07-28 NOTE — Progress Notes (Signed)
*  PRELIMINARY RESULTS* Echocardiogram 2D Echocardiogram has been performed.  Leavy Cella 07/28/2016, 9:48 AM

## 2016-07-29 ENCOUNTER — Telehealth: Payer: Self-pay | Admitting: *Deleted

## 2016-07-29 NOTE — Telephone Encounter (Signed)
Transition Care Management Follow-up Telephone Call   Date discharged? 07/28/16  How have you been since you were released from the hospital? Spoke with pt wife she states pt is doing fine   Do you understand why you were in the hospital? YES   Do you understand the discharge instructions? YES   Where were you discharged to? Home   Items Reviewed:  Medications reviewed: YES  Allergies reviewed: YES  Dietary changes reviewed: YES  Referrals reviewed: No referral needed  Functional Questionnaire:   Activities of Daily Living (ADLs):    she states he are independent in the following: ambulation, bathing and hygiene, feeding, continence, grooming, toileting and dressing States he require assistance with the following: ambulation sometimes   Any transportation issues/concerns?: NO   Any patient concerns? NO   Confirmed importance and date/time of follow-up visits scheduled YES, appt 08/05/16  Provider Appointment booked with [name]  Confirmed with patient if condition begins to worsen call PCP or go to the ER.  Patient was given the office number and encouraged to call back with question or concerns.  : YES

## 2016-07-31 ENCOUNTER — Encounter (INDEPENDENT_AMBULATORY_CARE_PROVIDER_SITE_OTHER): Payer: Self-pay

## 2016-07-31 ENCOUNTER — Ambulatory Visit (INDEPENDENT_AMBULATORY_CARE_PROVIDER_SITE_OTHER): Payer: Medicare Other | Admitting: Diagnostic Neuroimaging

## 2016-07-31 DIAGNOSIS — M79604 Pain in right leg: Secondary | ICD-10-CM | POA: Diagnosis not present

## 2016-07-31 DIAGNOSIS — Z0289 Encounter for other administrative examinations: Secondary | ICD-10-CM

## 2016-08-01 NOTE — Procedures (Signed)
   [facility]  NCS (NERVE CONDUCTION STUDY) WITH EMG (ELECTROMYOGRAPHY) REPORT   STUDY DATE: 07/31/16 PATIENT NAME: [name] DOB: 1951-03-13 MRN: [id number]  ORDERING CLINICIAN: Antionette Fairy, MD  TECHNOLOGIST: Laretta Alstrom  ELECTROMYOGRAPHER: Earlean Polka. Albie Arizpe, MD  CLINICAL INFORMATION: 65 year old male with diabetes, degenerative lumbar spine disease status post decompressive lumbar surgery November 2016, with increasing low back pain and right leg pain.  FINDINGS: NERVE CONDUCTION STUDY: Left median motor response is prolonged distal latency, decreased amplitude, normal conduction velocity and normal F-wave latency.  Left ulnar motor response and F-wave latency are normal.  Right peroneal and bilateral tibial motor responses could not be obtained.  Left peroneal motor response has prolonged distal latency, decreased amplitude, normal conduction velocity.  Left median and left ulnar sensory responses are normal. Bilateral peroneal sensory responses could not be obtained.   NEEDLE ELECTROMYOGRAPHY:  Right vastus medialis - normal  Left vastus medialis - 1+ positive sharp waves and fibrillation potentials; normal motor unit recruitment  Bilateral tibialis anterior - 2+ positive sharp waves and fibrillation potentials; decreased recruitment of large motor units on exertion  Bilateral gastrocnemius - 2+ positive sharp waves and fibrillation potentials; decreased recruitment of large motor units on exertion  Bilateral L4-5 paraspinal muscles - poor muscle relaxation; no definite positive sharp waves or fibrillation potentials   IMPRESSION:  Abnormal study demonstrate: 1. Severe axonal sensorimotor polyneuropathy with active and chronic denervation. 2. Superimposed bilateral L5-S1 lumbar radiculopathies is possible given the clinical context and abnormal EMG findings in the lower extremities. Although no definite lumbar paraspinal muscle spontaneous  activity was noted, the absence of this activity does not totally exclude lumbar radiculopathy. In addition, even if abnormal lumbar spine spontaneous activity were to be seen, in the setting of prior lumbar surgery this would not necessarily confirm ongoing lumbar radiculopathy problems.     INTERPRETING PHYSICIAN:  Penni Bombard, MD Certified in Neurology, Neurophysiology and Neuroimaging  [facility] [address] [place], [place] [address] [phone]

## 2016-08-05 ENCOUNTER — Ambulatory Visit (INDEPENDENT_AMBULATORY_CARE_PROVIDER_SITE_OTHER): Payer: Medicare Other | Admitting: Internal Medicine

## 2016-08-05 ENCOUNTER — Encounter: Payer: Self-pay | Admitting: Internal Medicine

## 2016-08-05 VITALS — BP 122/62 | HR 73 | Temp 97.7°F | Resp 16 | Ht 70.0 in | Wt 232.2 lb

## 2016-08-05 DIAGNOSIS — G629 Polyneuropathy, unspecified: Secondary | ICD-10-CM | POA: Diagnosis not present

## 2016-08-05 DIAGNOSIS — E0849 Diabetes mellitus due to underlying condition with other diabetic neurological complication: Secondary | ICD-10-CM | POA: Diagnosis not present

## 2016-08-05 DIAGNOSIS — Z794 Long term (current) use of insulin: Secondary | ICD-10-CM

## 2016-08-05 DIAGNOSIS — E7800 Pure hypercholesterolemia, unspecified: Secondary | ICD-10-CM

## 2016-08-05 DIAGNOSIS — G894 Chronic pain syndrome: Secondary | ICD-10-CM | POA: Diagnosis not present

## 2016-08-05 DIAGNOSIS — E118 Type 2 diabetes mellitus with unspecified complications: Secondary | ICD-10-CM

## 2016-08-05 DIAGNOSIS — I2510 Atherosclerotic heart disease of native coronary artery without angina pectoris: Secondary | ICD-10-CM

## 2016-08-05 DIAGNOSIS — M5416 Radiculopathy, lumbar region: Secondary | ICD-10-CM

## 2016-08-05 LAB — POCT GLYCOSYLATED HEMOGLOBIN (HGB A1C): Hemoglobin A1C: 6.4

## 2016-08-05 MED ORDER — "INSULIN SYRINGE 29G X 1/2"" 1 ML MISC": 3 refills | Status: DC

## 2016-08-05 MED ORDER — OXYCODONE-ACETAMINOPHEN 5-325 MG PO TABS: 2.0000 | ORAL_TABLET | Freq: Three times a day (TID) | ORAL | 0 refills | Status: DC | PRN

## 2016-08-05 MED ORDER — PRODIGY LANCETS 28G MISC: 3 refills | Status: DC

## 2016-08-05 MED ORDER — GLUCOSE BLOOD VI STRP: ORAL_STRIP | 12 refills | Status: DC

## 2016-08-05 MED ORDER — EZETIMIBE 10 MG PO TABS: 10.0000 mg | ORAL_TABLET | Freq: Every day | ORAL | 3 refills | Status: DC

## 2016-08-05 MED ORDER — INSULIN LISPRO PROT & LISPRO (75-25 MIX) 100 UNIT/ML ~~LOC~~ SUSP: 45.0000 [IU] | Freq: Two times a day (BID) | SUBCUTANEOUS | 5 refills | Status: DC

## 2016-08-05 NOTE — Patient Instructions (Signed)

## 2016-08-05 NOTE — Progress Notes (Signed)
Pre visit review using our clinic review tool, if applicable. No additional management support is needed unless otherwise documented below in the visit note. 

## 2016-08-05 NOTE — Progress Notes (Signed)
Subjective:  Patient ID: [name], male    DOB: 02/20/1951  Age: 65 y.o. MRN: [id number]  CC: Congestive Heart Failure; Back Pain; and Diabetes   HPI [name] presents for a hosp f/up. He was recently seen for chest pain and concerns about heart failure. He ruled out for MI. He tells me since discharge his chest pain has resolved. He also thinks his blood sugars up and well controlled however an A1c was not checked during his admission. He is controlling his blood sugars with insulin therapy and denies any recent episodes of visual disturbance, polyuria, polydipsia, or polyphagia. He had a recent nerve conduction studies/EMG that showed a severe axonal sensorimotor polyneuropathy.  Assessment & Plan:   Chest pain Type II diabetes mellitus with complication, uncontrolled (HCC) Coronary atherosclerosis Congestive heart failure (HCC) HYPERTENSION, BENIGN ESSENTIAL Chronic pain syndrome Sleep apnea  1. Chest pain: The patient has HEART score of 5. He claims pain is typical of his previous angina.  -Follow troponin trend.  -Echocardiogram pending at time of discharge  -Cardiology consulted.  -EKG with low voltage  -continue with Zetia, Imdur, aspirin. PRN nitroglycerin.  started on Ranexa, tolerating med so fa,.  Stress test low risk.    Outpatient Medications Prior to Visit  Medication Sig Dispense Refill  . aspirin EC 81 MG tablet Take 1 tablet (81 mg total) by mouth daily.    . Blood Glucose Calibration (ONETOUCH VERIO) SOLN 1 Act by In Vitro route 2 (two) times daily. 1 each 11  . Blood Glucose Monitoring Suppl (PRODIGY VOICE BLOOD GLUCOSE) W/DEVICE KIT Use to check blood sugars twice a day Dx e11.9 1 kit 0  . carvedilol (COREG) 12.5 MG tablet TAKE 1 TABLET BY MOUTH TWICE A DAY WITH A MEAL 180 tablet 3  . gabapentin (NEURONTIN) 300 MG capsule TAKE 1 CAPSULE (300 MG TOTAL) BY MOUTH 3 (THREE) TIMES DAILY. (Patient taking differently: TAKE 1 CAPSULE (300  MG TOTAL) BY MOUTH 3 (THREE) TIMES DAILY AS NEEDED FOR PAIN) 270 capsule 0  . isosorbide mononitrate (IMDUR) 120 MG 24 hr tablet TAKE 1 TABLET (120 MG TOTAL) BY MOUTH DAILY. 90 tablet 3  . JANUMET 50-1000 MG tablet TAKE 1 TABLET BY MOUTH TWICE A DAY WITH A MEAL 180 tablet 1  . losartan (COZAAR) 50 MG tablet Take 1 tablet (50 mg total) by mouth daily. 90 tablet 3  . omega-3 acid ethyl esters (LOVAZA) 1 g capsule TAKE 2 CAPSULES (2 G TOTAL) BY MOUTH 2 (TWO) TIMES DAILY. 120 capsule 11  . pravastatin (PRAVACHOL) 40 MG tablet TAKE 1 TABLET (40 MG TOTAL) BY MOUTH DAILY. DAILY 90 tablet 3  . ranolazine (RANEXA) 500 MG 12 hr tablet Take 1 tablet (500 mg total) by mouth 2 (two) times daily. 60 tablet 0  . tamsulosin (FLOMAX) 0.4 MG CAPS capsule Take 1 capsule (0.4 mg total) by mouth daily. 15 capsule 1  . ezetimibe (ZETIA) 10 MG tablet Take 1 tablet (10 mg total) by mouth daily. 90 tablet 3  . glucose blood (ONETOUCH VERIO) test strip Use BID 100 each 12  . glucose blood test strip 1 each by Other route 2 (two) times daily. Use to check blood sugars twice a day Dx E11.9 100 each 3  . insulin lispro protamine-lispro (HUMALOG 75/25 MIX) (75-25) 100 UNIT/ML SUSP injection Inject 45-65 Units into the skin 2 (two) times daily with a meal. Use 45 units every morning and use 65 units every evening 1 vial  0  . INSULIN SYRINGE 1CC/29G (B-D INSULIN SYRINGE) 29G X 1/2" 1 ML MISC Use as directed with insulin vials 100 each 11  . oxyCODONE-acetaminophen (PERCOCET/ROXICET) 5-325 MG tablet Take 2 tablets by mouth every 4 (four) hours as needed for severe pain. 15 tablet 0  . PRODIGY LANCETS 28G MISC Use to check blood sugars twice a day Dx e11.9 100 each 3  . traMADol (ULTRAM) 50 MG tablet TAKE 1 TO 2 TABLETS BY MOUTH EVERY 4 TO 6 HOURS AS NEEDED FOR PAIN (INS PAYS FOR 8 TABS PER DAY)  0   No facility-administered medications prior to visit.     ROS Review of Systems  Constitutional: Negative for activity change,  appetite change, diaphoresis, fatigue and fever.  HENT: Negative.   Eyes: Negative.  Negative for photophobia and visual disturbance.  Respiratory: Negative.  Negative for cough, choking, chest tightness, shortness of breath and stridor.   Cardiovascular: Negative.  Negative for chest pain, palpitations and leg swelling.  Gastrointestinal: Negative for abdominal pain, blood in stool, constipation, diarrhea, nausea and vomiting.  Endocrine: Negative.  Negative for polydipsia, polyphagia and polyuria.  Genitourinary: Negative.  Negative for difficulty urinating.  Musculoskeletal: Positive for arthralgias and back pain. Negative for joint swelling, myalgias and neck pain.       He has chronic right lower back and right lower extremity and right hip pain. He has been told that he has a problem in his lumbar spine and needs surgery but he cannot have surgery. He requests a refill on oxycodone for relief of the pain.  Skin: Negative.  Negative for color change and rash.  Allergic/Immunologic: Negative.   Neurological: Positive for weakness (chronic weakness in his right lower extremity radiating from the back).  Hematological: Negative.  Negative for adenopathy. Does not bruise/bleed easily.  Psychiatric/Behavioral: Negative.  Negative for decreased concentration, dysphoric mood, sleep disturbance and suicidal ideas. The patient is not nervous/anxious.     Objective:  BP 122/62 (BP Location: Left Arm, Patient Position: Sitting, Cuff Size: Normal)   Pulse 73   Temp 97.7 F (36.5 C) (Oral)   Resp 16   Ht 5' 10"  (1.778 m)   Wt 232 lb 4 oz (105.3 kg)   SpO2 97%   BMI 33.32 kg/m   BP Readings from Last 3 Encounters:  08/05/16 122/62  07/28/16 133/74  05/22/16 118/70    Wt Readings from Last 3 Encounters:  08/05/16 232 lb 4 oz (105.3 kg)  07/28/16 224 lb 9.6 oz (101.9 kg)  05/22/16 244 lb (110.7 kg)    Physical Exam  Constitutional: He is oriented to person, place, and time. No  distress.  HENT:  Mouth/Throat: Oropharynx is clear and moist. No oropharyngeal exudate.  Eyes: Conjunctivae are normal. Right eye exhibits no discharge. Left eye exhibits no discharge. No scleral icterus.  Neck: Normal range of motion. Neck supple. No JVD present. No tracheal deviation present. No thyromegaly present.  Cardiovascular: Normal rate, regular rhythm, normal heart sounds and intact distal pulses.  Exam reveals no gallop and no friction rub.   No murmur heard. Pulmonary/Chest: Effort normal and breath sounds normal. No stridor. No respiratory distress. He has no wheezes. He has no rales. He exhibits no tenderness.  Abdominal: Soft. Bowel sounds are normal. He exhibits no distension and no mass. There is no tenderness. There is no rebound and no guarding.  Musculoskeletal: Normal range of motion. He exhibits no edema or tenderness.  Neurological: He is oriented to person, place,  and time.  Skin: Skin is warm and dry. No rash noted. He is not diaphoretic. No erythema. No pallor.  Psychiatric: He has a normal mood and affect. His behavior is normal. Judgment and thought content normal.  Vitals reviewed.   Lab Results  Component Value Date   WBC 6.4 07/26/2016   HGB 12.6 (L) 07/26/2016   HCT 39.1 07/26/2016   PLT 228 07/26/2016   GLUCOSE 83 07/26/2016   CHOL 171 04/09/2016   TRIG 127.0 04/09/2016   HDL 28.20 (L) 04/09/2016   LDLDIRECT 82.7 04/04/2013   LDLCALC 117 (H) 04/09/2016   ALT 17 07/26/2016   AST 18 07/26/2016   NA 136 07/26/2016   K 3.7 07/26/2016   CL 100 (L) 07/26/2016   CREATININE 1.23 07/26/2016   BUN 16 07/26/2016   CO2 28 07/26/2016   TSH 1.50 04/09/2016   PSA 1.99 04/09/2016   INR 0.95 08/17/2010   HGBA1C 6.4 08/05/2016   MICROALBUR <0.7 04/09/2016    Nm Myocar Multi W/spect W/wall Motion / Ef  Result Date: 07/27/2016 CLINICAL DATA:  65 year old male with chest pain and shortness of breath EXAM: MYOCARDIAL IMAGING WITH SPECT (REST AND  PHARMACOLOGIC-STRESS) GATED LEFT VENTRICULAR WALL MOTION STUDY LEFT VENTRICULAR EJECTION FRACTION TECHNIQUE: Standard myocardial SPECT imaging was performed after resting intravenous injection of 10 mCi Tc-61mtetrofosmin. Subsequently, intravenous infusion of Lexiscan was performed under the supervision of the Cardiology staff. At peak effect of the drug, 30 mCi Tc-970metrofosmin was injected intravenously and standard myocardial SPECT imaging was performed. Quantitative gated imaging was also performed to evaluate left ventricular wall motion, and estimate left ventricular ejection fraction. COMPARISON:  Prior chest x-ray 07/26/2016 FINDINGS: Perfusion: No decreased activity in the left ventricle on stress imaging to suggest reversible ischemia or infarction. Wall Motion: Normal left ventricular wall motion. No left ventricular dilation. Left Ventricular Ejection Fraction: 56 % End diastolic volume 62 ml End systolic volume 27 ml IMPRESSION: 1. No reversible ischemia or infarction. 2. Normal left ventricular wall motion. 3. Left ventricular ejection fraction 56% 4. Non invasive risk stratification*: Low *2012 Appropriate Use Criteria for Coronary Revascularization Focused Update: J Am Coll Cardiol. 203664;40(3):[email or web address] Electronically Signed   By: HeJacqulynn Cadet.D.   On: 07/27/2016 15:36   ECHO Study Conclusions  - Left ventricle: The cavity size was normal. Wall thickness was   increased in a pattern of moderate LVH. Systolic function was   normal. The estimated ejection fraction was in the range of 60%   to 65%. Wall motion was normal; there were no regional wall   motion abnormalities. Doppler parameters are consistent with   abnormal left ventricular relaxation (grade 1 diastolic   dysfunction). Doppler parameters are consistent with high   ventricular filling pressure. - Mitral valve: Calcified annulus. - Pericardium,  extracardiac: A trivial pericardial effusion was   identified.  Impressions:  - Normal LV systolic function; moderate LVH; grade 1 diastolic   dysfunction with elevated LV filling pressure.  IMPRESSION: 2017 Abnormal study demonstrate: 1. Severe axonal sensorimotor polyneuropathy with active and chronic denervation. 2. Superimposed bilateral L5-S1 lumbar radiculopathies is possible given the clinical context and abnormal EMG findings in the lower extremities. Although no definite lumbar paraspinal muscle spontaneous activity was noted, the absence of this activity does not totally exclude lumbar radiculopathy. In addition, even if abnormal lumbar spine spontaneous activity were to be seen, in the setting of prior lumbar surgery this would not necessarily confirm ongoing lumbar radiculopathy problems.  Assessment & Plan:   [name] was seen today for congestive heart failure, back pain and diabetes.  Diagnoses and all orders for this visit:  Chronic pain syndrome -     oxyCODONE-acetaminophen (PERCOCET/ROXICET) 5-325 MG tablet; Take 2 tablets by mouth every 8 (eight) hours as needed for severe pain.  Neuropathy ([place]) -     Ambulatory referral to Neurology -     POCT glycosylated hemoglobin (Hb A1C)  Lumbar radiculopathy -     oxyCODONE-acetaminophen (PERCOCET/ROXICET) 5-325 MG tablet; Take 2 tablets by mouth every 8 (eight) hours as needed for severe pain.  Other diabetic neurological complication associated with diabetes mellitus due to underlying condition ([place])  Type 2 diabetes mellitus with complication, with long-term current use of insulin ([place])- his A1c is 6.4%, his blood sugars are adequately well-controlled.  Atherosclerosis of native coronary artery of native heart without angina pectoris- he has had no more episodes of angina and recently ruled out for myocardial infarction, will continue to control his risk factors with aspirin therapy, blood pressure control, blood sugar  control, and statin therapy. -     ezetimibe (ZETIA) 10 MG tablet; Take 1 tablet (10 mg total) by mouth daily.  Pure hypercholesterolemia -     ezetimibe (ZETIA) 10 MG tablet; Take 1 tablet (10 mg total) by mouth daily.  Other orders -     glucose blood (ONETOUCH VERIO) test strip; Use BID to check BS. DX E11.9 -     PRODIGY LANCETS 28G MISC; Use to check blood sugars twice a day Dx e11.9 -     INSULIN SYRINGE 1CC/29G (B-D INSULIN SYRINGE) 29G X 1/2" 1 ML MISC; Use as directed with insulin vials -     insulin lispro protamine-lispro (HUMALOG 75/25 MIX) (75-25) 100 UNIT/ML SUSP injection; Inject 45-65 Units into the skin 2 (two) times daily with a meal. Use 45 units every morning and use 65 units every evening   I have discontinued [name]'s glucose blood and traMADol. I have also changed his oxyCODONE-acetaminophen and glucose blood. Additionally, I am having him maintain his gabapentin, ONETOUCH VERIO, PRODIGY VOICE BLOOD GLUCOSE, tamsulosin, omega-3 acid ethyl esters, JANUMET, losartan, isosorbide mononitrate, pravastatin, aspirin EC, carvedilol, ranolazine, ezetimibe, PRODIGY LANCETS 28G, INSULIN SYRINGE 1CC/29G, and insulin lispro protamine-lispro.  Meds ordered this encounter  Medications  . oxyCODONE-acetaminophen (PERCOCET/ROXICET) 5-325 MG tablet    Sig: Take 2 tablets by mouth every 8 (eight) hours as needed for severe pain.    Dispense:  75 tablet    Refill:  0  . ezetimibe (ZETIA) 10 MG tablet    Sig: Take 1 tablet (10 mg total) by mouth daily.    Dispense:  90 tablet    Refill:  3  . glucose blood (ONETOUCH VERIO) test strip    Sig: Use BID to check BS. DX E11.9    Dispense:  100 each    Refill:  12  . PRODIGY LANCETS 28G MISC    Sig: Use to check blood sugars twice a day Dx e11.9    Dispense:  100 each    Refill:  3  . INSULIN SYRINGE 1CC/29G (B-D INSULIN SYRINGE) 29G X 1/2" 1 ML MISC    Sig: Use as directed with insulin vials    Dispense:  200 each    Refill:   3  . insulin lispro protamine-lispro (HUMALOG 75/25 MIX) (75-25) 100 UNIT/ML SUSP injection    Sig: Inject 45-65 Units into the skin 2 (two) times daily with a  meal. Use 45 units every morning and use 65 units every evening    Dispense:  4 vial    Refill:  5     Follow-up: Return in about 4 months (around 12/05/2016).  [name], MD

## 2016-08-13 NOTE — Progress Notes (Signed)
Cardiology Office Note    Date:  08/14/2016   ID:  [name], DOB Apr 13, 1951, MRN [id number]  PCP:  [name], MD  Cardiologist:  Dr. Marlou Porch  CC: post hosp f/u  History of Present Illness:  [name] is a 65 y.o. male with a history of DMT2, HTN, HLD, obesity, blindness, OSA, CAD s/p prior stenting to OM and LCx and history of non compliance who presents to clinic for post hospital follow up.  He has a hx of CAD. Last intervention was done in 11/08 with BMS to the proximal LCx. He has residual high-grade proximal RCA stenosis that has been treated medically due to it not being favorable for PCI. Myoview 9/14 was low risk with no ischemia. Last seen by Dr. Marlou Porch 1/16. He was then seen for follow up by Richardson Dopp PA-C on 05/22/16 and felt to be stable from a cardiac standpoint.   He was recently admitted 8/6-07/28/16 for increasing fatigue, chest pain and DOE. He ruled out for MI. 2D ECHO showed normal LV function with no WMAs, mod LVH, G1DD and myoview was normal with no ischemia. Ranexa was added to his medical regimen.   Today he presents to clinic for follow up. He seems to be doing well with no more chest pain or SOB. No LE edema, orthopnea or PND. NO dizziness or syncope. He does feel like he has to clear his throat a lot. He took his wife's PPI and this helped. He does have a lot of pain in his legs but has known nerve conduction disease. He also has a bad disc that may need a fusion. No exertional leg pain.   Past Medical History:  Diagnosis Date  . Arthritis    "mild arthritis in hip"  . CAD (coronary artery disease)   . CHF (congestive heart failure) ([place])   . Complication of anesthesia    "lungs filled up with fluid"   . Diabetes mellitus    Type II  . Glaucoma   . Heart murmur   . HTN (hypertension)   . Hypercholesterolemia   . LBP (low back pain)   . Legally blind   . Sleep apnea   . Staph infection    "from back surgery"  . Trigger finger   . Ulcer     . Wears glasses    "to protect cornea"    Past Surgical History:  Procedure Laterality Date  . CHOLECYSTECTOMY  09/15/2012   Procedure: LAPAROSCOPIC CHOLECYSTECTOMY WITH INTRAOPERATIVE CHOLANGIOGRAM;  Surgeon: Pedro Earls, MD;  Location: [facility];  Service: General;  Laterality: N/A;  . EYE SURGERY Bilateral    cataracts  . INGUINAL HERNIA REPAIR     right  . LUMBAR FUSION    . LUMBAR LAMINECTOMY/DECOMPRESSION MICRODISCECTOMY N/A 10/26/2015   Procedure: RIGHT AND CENTRAL LUMBAR LAMINECTOMY L3-4, RIGHT L5-S1 LATERAL RECESS DECOMPRESSION;  Surgeon: Jessy Oto, MD;  Location: [facility];  Service: Orthopedics;  Laterality: N/A;  . NECK SURGERY    . peptic ulcer dz surgery  pt was in his 20s   bleeding ulcer.   . Prosthetic Cornea placement, Right eye  2007   [place] Right 10/26/2015   Procedure: RELEASE TRIGGER FINGER RIGHT THUMB;  Surgeon: Jessy Oto, MD;  Location: [facility];  Service: Orthopedics;  Laterality: Right;    Current Medications: Outpatient Medications Prior to Visit  Medication Sig Dispense Refill  . aspirin EC 81 MG tablet Take  1 tablet (81 mg total) by mouth daily.    . Blood Glucose Calibration (ONETOUCH VERIO) SOLN 1 Act by In Vitro route 2 (two) times daily. 1 each 11  . Blood Glucose Monitoring Suppl (PRODIGY VOICE BLOOD GLUCOSE) W/DEVICE KIT Use to check blood sugars twice a day Dx e11.9 1 kit 0  . carvedilol (COREG) 12.5 MG tablet TAKE 1 TABLET BY MOUTH TWICE A DAY WITH A MEAL 180 tablet 3  . ezetimibe (ZETIA) 10 MG tablet Take 1 tablet (10 mg total) by mouth daily. 90 tablet 3  . glucose blood (ONETOUCH VERIO) test strip Use BID to check BS. DX E11.9 100 each 12  . insulin lispro protamine-lispro (HUMALOG 75/25 MIX) (75-25) 100 UNIT/ML SUSP injection Inject 45-65 Units into the skin 2 (two) times daily with a meal. Use 45 units every morning and use 65 units every evening 4 vial 5  . INSULIN SYRINGE 1CC/29G (B-D INSULIN SYRINGE)  29G X 1/2" 1 ML MISC Use as directed with insulin vials 200 each 3  . isosorbide mononitrate (IMDUR) 120 MG 24 hr tablet TAKE 1 TABLET (120 MG TOTAL) BY MOUTH DAILY. 90 tablet 3  . JANUMET 50-1000 MG tablet TAKE 1 TABLET BY MOUTH TWICE A DAY WITH A MEAL 180 tablet 1  . losartan (COZAAR) 50 MG tablet Take 1 tablet (50 mg total) by mouth daily. 90 tablet 3  . omega-3 acid ethyl esters (LOVAZA) 1 g capsule TAKE 2 CAPSULES (2 G TOTAL) BY MOUTH 2 (TWO) TIMES DAILY. 120 capsule 11  . oxyCODONE-acetaminophen (PERCOCET/ROXICET) 5-325 MG tablet Take 2 tablets by mouth every 8 (eight) hours as needed for severe pain. 75 tablet 0  . pravastatin (PRAVACHOL) 40 MG tablet TAKE 1 TABLET (40 MG TOTAL) BY MOUTH DAILY. DAILY 90 tablet 3  . PRODIGY LANCETS 28G MISC Use to check blood sugars twice a day Dx e11.9 100 each 3  . tamsulosin (FLOMAX) 0.4 MG CAPS capsule Take 1 capsule (0.4 mg total) by mouth daily. 15 capsule 1  . ranolazine (RANEXA) 500 MG 12 hr tablet Take 1 tablet (500 mg total) by mouth 2 (two) times daily. 60 tablet 0  . gabapentin (NEURONTIN) 300 MG capsule TAKE 1 CAPSULE (300 MG TOTAL) BY MOUTH 3 (THREE) TIMES DAILY. (Patient taking differently: TAKE 1 CAPSULE (300 MG TOTAL) BY MOUTH 3 (THREE) TIMES DAILY AS NEEDED FOR PAIN) 270 capsule 0   No facility-administered medications prior to visit.      Allergies:   Furosemide; Diamox [acetazolamide]; and Lisinopril   Social History   Social History  . Marital status: Married    Spouse name: N/A  . Number of children: N/A  . Years of education: N/A   Occupational History  . disabled     blind   Social History Main Topics  . Smoking status: Never Smoker  . Smokeless tobacco: Never Used  . Alcohol use No  . Drug use: No  . Sexual activity: Not Currently   Other Topics Concern  . None   Social History Narrative   Occupation: disabled, blind   Married   Regular Exercise-no           Family History:  The patient's family history  includes Breast cancer in his mother; Colon cancer in his mother and other; Diabetes in his brother, mother, and sister; Hypertension in his father and mother.     ROS:   Please see the history of present illness.    ROS All other systems  reviewed and are negative.   PHYSICAL EXAM:   VS:  BP (!) 144/70   Pulse 80   Ht _0  (1.778 m)   Wt 231 lb 1.9 oz (104.8 kg)   SpO2 95%   BMI 33.16 kg/m    GEN: Well nourished, well developed, in no acute distress, blindness HEENT: normal  Neck: no JVD, carotid bruits, or masses Cardiac: RRR; no murmurs, rubs, or gallops,no edema  Respiratory:  clear to auscultation bilaterally, normal work of breathing GI: soft, nontender, nondistended, + BS MS: no deformity or atrophy  Skin: warm and dry, no rash Neuro:  Alert and Oriented x 3, Strength and sensation are intact Psych: euthymic mood, full affect  Wt Readings from Last 3 Encounters:  08/14/16 231 lb 1.9 oz (104.8 kg)  08/05/16 232 lb 4 oz (105.3 kg)  07/28/16 224 lb 9.6 oz (101.9 kg)      Studies/Labs Reviewed:   EKG:  EKG is NOT ordered today.    Recent Labs: 04/09/2016: TSH 1.50 07/26/2016: ALT 17; BUN 16; Creatinine, Ser 1.23; Hemoglobin 12.6; Platelets 228; Potassium 3.7; Sodium 136 07/27/2016: B Natriuretic Peptide 4.1   Lipid Panel    Component Value Date/Time   CHOL 171 04/09/2016 1041   TRIG 127.0 04/09/2016 1041   HDL 28.20 (L) 04/09/2016 1041   CHOLHDL 6 04/09/2016 1041   VLDL 25.4 04/09/2016 1041   LDLCALC 117 (H) 04/09/2016 1041   LDLDIRECT 82.7 04/04/2013 1451    Additional studies/ records that were reviewed today include:  LHC 11/08 LM no significant disease LAD proximal 30%, distal 80% at the apex LCx proximal 95%, second branching segment of OM1 70% proximal, OM 2 proximal 60% distal to previously placed stent RCA proximal/mid 95% EF 65% PCI: BMS to the proximal LCx  IMPRESSION: 1. A 95% proximal circumflex stenosis - plan PCI, Dr. Rodell Perna, bare metal stent 3.5 x 20 mm. 2. Proximal 95% stenosis of the right coronary artery in a diffusely diseased vessel. 3. Distal LAD 80% stenosis at the apex. 4. Normal left ventricular ejection fraction estimated 65% with no regional wall motion abnormalities. 5. After plain PCI, femoral angiogram was performed and Angio-Seal was used as a arteriotomy closure device. IMPRESSION: 1. A 95% proximal circumflex stenosis - plan PCI, Dr. Rodell Perna, bare metal stent 3.5 x 20 mm. 2. Proximal 95% stenosis of the right coronary artery in a diffusely diseased vessel. 3. Distal LAD 80% stenosis at the apex. 4. Normal left ventricular ejection fraction estimated 65% with no regional wall motion abnormalities. 5. After plain PCI, femoral angiogram was performed and Angio-Seal was used as a arteriotomy closure device.  Study Date: 07/28/2016 LV EF: 60% -   65% Study Conclusions - Left ventricle: The cavity size was normal. Wall thickness was   increased in a pattern of moderate LVH. Systolic function was   normal. The estimated ejection fraction was in the range of 60%   to 65%. Wall motion was normal; there were no regional wall   motion abnormalities. Doppler parameters are consistent with   abnormal left ventricular relaxation (grade 1 diastolic   dysfunction). Doppler parameters are consistent with high   ventricular filling pressure. - Mitral valve: Calcified annulus. - Pericardium, extracardiac: A trivial pericardial effusion was   identified. Impressions: - Normal LV systolic function; moderate LVH; grade 1 diastolic   dysfunction with elevated LV filling pressure.  Myoview 07/27/16 IMPRESSION: 1. No reversible ischemia or infarction. 2. Normal left ventricular wall motion.  3. Left ventricular ejection fraction 56% 4. Non invasive risk stratification*: Low  ASSESSMENT & PLAN:   CAD: continue ASA, BB and statin. Stable.  Recent admission with reassuring echo and nuc. Started on Ranexa 540m BID and doing quite well on this. Gave him 2 weeks of samples and filled 90 day supply.  HTN: Blood pressure well controlled on imdur 1281mdaily, losartan 5057maily and Coreg 12.5mg21mD.   HLD:  This is managed by primary care. LDL in 4/17 was 117. Goal LDL is less than 70. He will continue Zetia and pravastatin and follow-up with primary care.  Diabetes: well controlled. Hemoglobin A1c 6.7 in 4/17.  GERD: will start Protonix 40mg34m.   Medication Adjustments/Labs and Tests Ordered: Current medicines are reviewed at length with the patient today.  Concerns regarding medicines are outlined above.  Medication changes, Labs and Tests ordered today are listed in the Patient Instructions below. Patient Instructions  Medication Instructions:   START TAKING PROTONIX 40 MG TWICE A DAY   START TAKING NITROGLYCERIN AS NEEDED FOR CHEST PAIN   (INSTRUCTIONS NEEDED)   If you need a refill on your cardiac medications before your next appointment, please call your pharmacy.  Labwork: NONE ORDER TODAY    Testing/Procedures:   NONE ORDER TODAY    Follow-Up:  WITH DR SKAINMarlou Porch3 TO 4 MONTHS    Any Other Special Instructions Will Be Listed Below (If Applicable).  Nitroglycerin sublingual tablets  What is this medicine?  NITROGLYCERIN (nye troe GLI ser in) is a type of vasodilator. It relaxes blood vessels, increasing the blood and oxygen supply to your heart. This medicine is used to relieve chest pain caused by angina. It is also used to prevent chest pain before activities like climbing stairs, going outdoors in cold weather, or sexual activity. This medicine may be used for other purposes; ask your health care provider or pharmacist if you have questions.  What should I tell my health care provider before I take this medicine? They need to know if you have any of these conditions: -anemia -head injury, recent  stroke, or bleeding in the brain -liver disease -previous heart attack -an unusual or allergic reaction to nitroglycerin, other medicines, foods, dyes, or preservatives -pregnant or trying to get pregnant -breast-feeding  How should I use this medicine? Take this medicine by mouth as needed. At the first sign of an angina attack (chest pain or tightness) place one tablet under your tongue. You can also take this medicine 5 to 10 minutes before an event likely to produce chest pain. Follow the directions on the prescription label. Let the tablet dissolve under the tongue. Do not swallow whole. Replace the dose if you accidentally swallow it. It will help if your mouth is not dry. Saliva around the tablet will help it to dissolve more quickly. Do not eat or drink, smoke or chew tobacco while a tablet is dissolving. If you are not better within 5 minutes after taking ONE dose of nitroglycerin, call 9-1-1 immediately to seek emergency medical care. Do not take more than 3 nitroglycerin tablets over 15 minutes. If you take this medicine often to relieve symptoms of angina, your doctor or health care professional may provide you with different instructions to manage your symptoms. If symptoms do not go away after following these instructions, it is important to call 9-1-1 immediately. Do not take more than 3 nitroglycerin tablets over 15 minutes. Talk to your pediatrician regarding the use of  this medicine in children. Special care may be needed. Overdosage: If you think you have taken too much of this medicine contact a poison control center or emergency room at once. NOTE: This medicine is only for you. Do not share this medicine with others.  What if I miss a dose? This does not apply. This medicine is only used as needed.  What may interact with this medicine? Do not take this medicine with any of the following medications: -certain migraine medicines like ergotamine and dihydroergotamine  (DHE) -medicines used to treat erectile dysfunction like sildenafil, tadalafil, and vardenafil -riociguat This medicine may also interact with the following medications: -alteplase -aspirin -heparin -medicines for high blood pressure -medicines for mental depression -other medicines used to treat angina -phenothiazines like chlorpromazine, mesoridazine, prochlorperazine, thioridazine This list may not describe all possible interactions. Give your health care provider a list of all the medicines, herbs, non-prescription drugs, or dietary supplements you use. Also tell them if you smoke, drink alcohol, or use illegal drugs. Some items may interact with your medicine.  What should I watch for while using this medicine? Tell your doctor or health care professional if you feel your medicine is no longer working. Keep this medicine with you at all times. Sit or lie down when you take your medicine to prevent falling if you feel dizzy or faint after using it. Try to remain calm. This will help you to feel better faster. If you feel dizzy, take several deep breaths and lie down with your feet propped up, or bend forward with your head resting between your knees. You may get drowsy or dizzy. Do not drive, use machinery, or do anything that needs mental alertness until you know how this drug affects you. Do not stand or sit up quickly, especially if you are an older patient. This reduces the risk of dizzy or fainting spells. Alcohol can make you more drowsy and dizzy. Avoid alcoholic drinks. Do not treat yourself for coughs, colds, or pain while you are taking this medicine without asking your doctor or health care professional for advice. Some ingredients may increase your blood pressure . What side effects may I notice from receiving this medicine? Side effects that you should report to your doctor or health care professional as soon as possible: -blurred vision -dry mouth -skin rash -sweating -the  feeling of extreme pressure in the head -unusually weak or tired Side effects that usually do not require medical attention (report to your doctor or health care professional if they continue or are bothersome): -flushing of the face or neck -headache -irregular heartbeat, palpitations -nausea, vomiting This list may not describe all possible side effects. Call your doctor for medical advice about side effects. You may report side effects to FDA at 1-800-FDA-1088.  Where should I keep my medicine? Keep out of the reach of children. Store at room temperature between 20 and 25 degrees C (68 and 77 degrees F). Store in [employer]. Protect from light and moisture. Keep tightly closed. Throw away any unused medicine after the expiration date. NOTE: This sheet is a summary. It may not cover all possible information. If you have questions about this medicine, talk to your doctor, pharmacist, or health care provider.    2016, Elsevier/Gold Standard. (2013-10-06 17:57:36)  Mable Fill, PA-C  08/14/2016 11:51 AM    [facility] [place], [place], [place]  [address] Phone: [phone]; Fax: [phone]

## 2016-08-14 ENCOUNTER — Encounter: Payer: Self-pay | Admitting: Physician Assistant

## 2016-08-14 ENCOUNTER — Ambulatory Visit (INDEPENDENT_AMBULATORY_CARE_PROVIDER_SITE_OTHER): Payer: Medicare Other | Admitting: Physician Assistant

## 2016-08-14 VITALS — BP 144/70 | HR 80 | Ht 70.0 in | Wt 231.1 lb

## 2016-08-14 DIAGNOSIS — E118 Type 2 diabetes mellitus with unspecified complications: Secondary | ICD-10-CM

## 2016-08-14 DIAGNOSIS — H547 Unspecified visual loss: Secondary | ICD-10-CM

## 2016-08-14 DIAGNOSIS — E785 Hyperlipidemia, unspecified: Secondary | ICD-10-CM | POA: Diagnosis not present

## 2016-08-14 DIAGNOSIS — H540 Blindness, both eyes: Secondary | ICD-10-CM

## 2016-08-14 DIAGNOSIS — I5022 Chronic systolic (congestive) heart failure: Secondary | ICD-10-CM

## 2016-08-14 DIAGNOSIS — K219 Gastro-esophageal reflux disease without esophagitis: Secondary | ICD-10-CM

## 2016-08-14 DIAGNOSIS — I10 Essential (primary) hypertension: Secondary | ICD-10-CM

## 2016-08-14 DIAGNOSIS — I2510 Atherosclerotic heart disease of native coronary artery without angina pectoris: Secondary | ICD-10-CM

## 2016-08-14 MED ORDER — NITROGLYCERIN 0.4 MG SL SUBL: 0.4000 mg | SUBLINGUAL_TABLET | SUBLINGUAL | 3 refills | Status: DC | PRN

## 2016-08-14 MED ORDER — PANTOPRAZOLE SODIUM 40 MG PO TBEC: 40.0000 mg | DELAYED_RELEASE_TABLET | Freq: Two times a day (BID) | ORAL | 3 refills | Status: DC

## 2016-08-14 MED ORDER — RANOLAZINE ER 500 MG PO TB12: 500.0000 mg | ORAL_TABLET | Freq: Two times a day (BID) | ORAL | 3 refills | Status: DC

## 2016-08-14 NOTE — Patient Instructions (Addendum)
Medication Instructions:   START TAKING PROTONIX 40 MG TWICE A DAY   START TAKING NITROGLYCERIN AS NEEDED FOR CHEST PAIN   (INSTRUCTIONS NEEDED)   If you need a refill on your cardiac medications before your next appointment, please call your pharmacy.  Labwork: NONE ORDER TODAY    Testing/Procedures:   NONE ORDER TODAY    Follow-Up:  WITH DR Marlou Porch  IN 3 TO 4 MONTHS    Any Other Special Instructions Will Be Listed Below (If Applicable).  Nitroglycerin sublingual tablets  What is this medicine?  NITROGLYCERIN (nye troe GLI ser in) is a type of vasodilator. It relaxes blood vessels, increasing the blood and oxygen supply to your heart. This medicine is used to relieve chest pain caused by angina. It is also used to prevent chest pain before activities like climbing stairs, going outdoors in cold weather, or sexual activity. This medicine may be used for other purposes; ask your health care provider or pharmacist if you have questions.  What should I tell my health care provider before I take this medicine? They need to know if you have any of these conditions: -anemia -head injury, recent stroke, or bleeding in the brain -liver disease -previous heart attack -an unusual or allergic reaction to nitroglycerin, other medicines, foods, dyes, or preservatives -pregnant or trying to get pregnant -breast-feeding  How should I use this medicine? Take this medicine by mouth as needed. At the first sign of an angina attack (chest pain or tightness) place one tablet under your tongue. You can also take this medicine 5 to 10 minutes before an event likely to produce chest pain. Follow the directions on the prescription label. Let the tablet dissolve under the tongue. Do not swallow whole. Replace the dose if you accidentally swallow it. It will help if your mouth is not dry. Saliva around the tablet will help it to dissolve more quickly. Do not eat or drink, smoke or chew tobacco while a  tablet is dissolving. If you are not better within 5 minutes after taking ONE dose of nitroglycerin, call 9-1-1 immediately to seek emergency medical care. Do not take more than 3 nitroglycerin tablets over 15 minutes. If you take this medicine often to relieve symptoms of angina, your doctor or health care professional may provide you with different instructions to manage your symptoms. If symptoms do not go away after following these instructions, it is important to call 9-1-1 immediately. Do not take more than 3 nitroglycerin tablets over 15 minutes. Talk to your pediatrician regarding the use of this medicine in children. Special care may be needed. Overdosage: If you think you have taken too much of this medicine contact a poison control center or emergency room at once. NOTE: This medicine is only for you. Do not share this medicine with others.  What if I miss a dose? This does not apply. This medicine is only used as needed.  What may interact with this medicine? Do not take this medicine with any of the following medications: -certain migraine medicines like ergotamine and dihydroergotamine (DHE) -medicines used to treat erectile dysfunction like sildenafil, tadalafil, and vardenafil -riociguat This medicine may also interact with the following medications: -alteplase -aspirin -heparin -medicines for high blood pressure -medicines for mental depression -other medicines used to treat angina -phenothiazines like chlorpromazine, mesoridazine, prochlorperazine, thioridazine This list may not describe all possible interactions. Give your health care provider a list of all the medicines, herbs, non-prescription drugs, or dietary supplements you use. Also  tell them if you smoke, drink alcohol, or use illegal drugs. Some items may interact with your medicine.  What should I watch for while using this medicine? Tell your doctor or health care professional if you feel your medicine is no  longer working. Keep this medicine with you at all times. Sit or lie down when you take your medicine to prevent falling if you feel dizzy or faint after using it. Try to remain calm. This will help you to feel better faster. If you feel dizzy, take several deep breaths and lie down with your feet propped up, or bend forward with your head resting between your knees. You may get drowsy or dizzy. Do not drive, use machinery, or do anything that needs mental alertness until you know how this drug affects you. Do not stand or sit up quickly, especially if you are an older patient. This reduces the risk of dizzy or fainting spells. Alcohol can make you more drowsy and dizzy. Avoid alcoholic drinks. Do not treat yourself for coughs, colds, or pain while you are taking this medicine without asking your doctor or health care professional for advice. Some ingredients may increase your blood pressure . What side effects may I notice from receiving this medicine? Side effects that you should report to your doctor or health care professional as soon as possible: -blurred vision -dry mouth -skin rash -sweating -the feeling of extreme pressure in the head -unusually weak or tired Side effects that usually do not require medical attention (report to your doctor or health care professional if they continue or are bothersome): -flushing of the face or neck -headache -irregular heartbeat, palpitations -nausea, vomiting This list may not describe all possible side effects. Call your doctor for medical advice about side effects. You may report side effects to FDA at 1-800-FDA-1088.  Where should I keep my medicine? Keep out of the reach of children. Store at room temperature between 20 and 25 degrees C (68 and 77 degrees F). Store in [employer]. Protect from light and moisture. Keep tightly closed. Throw away any unused medicine after the expiration date. NOTE: This sheet is a summary. It may not cover  all possible information. If you have questions about this medicine, talk to your doctor, pharmacist, or health care provider.    2016, Elsevier/Gold Standard. (2013-10-06 17:57:36)

## 2016-08-20 ENCOUNTER — Other Ambulatory Visit: Payer: Self-pay | Admitting: Internal Medicine

## 2016-08-20 DIAGNOSIS — E1165 Type 2 diabetes mellitus with hyperglycemia: Secondary | ICD-10-CM

## 2016-08-20 DIAGNOSIS — IMO0002 Reserved for concepts with insufficient information to code with codable children: Secondary | ICD-10-CM

## 2016-08-20 DIAGNOSIS — E118 Type 2 diabetes mellitus with unspecified complications: Principal | ICD-10-CM

## 2016-09-12 ENCOUNTER — Encounter: Payer: Self-pay | Admitting: Diagnostic Neuroimaging

## 2016-09-12 ENCOUNTER — Ambulatory Visit: Payer: Medicare Other | Admitting: Diagnostic Neuroimaging

## 2016-09-12 ENCOUNTER — Ambulatory Visit (INDEPENDENT_AMBULATORY_CARE_PROVIDER_SITE_OTHER): Payer: Medicare Other | Admitting: Diagnostic Neuroimaging

## 2016-09-12 VITALS — BP 100/61 | HR 68 | Ht 70.0 in | Wt 237.8 lb

## 2016-09-12 DIAGNOSIS — E1142 Type 2 diabetes mellitus with diabetic polyneuropathy: Secondary | ICD-10-CM

## 2016-09-12 DIAGNOSIS — M5416 Radiculopathy, lumbar region: Secondary | ICD-10-CM | POA: Diagnosis not present

## 2016-09-12 NOTE — Patient Instructions (Addendum)
-   continue diabetes treatment  - if you have severe burning, pins/needles pain in feet, then may use gabapentin for neuropathic pain treatment  - follow up with Dr. Louanne Skye or pain mgmt referral for low back pain / lumbar radiculopathy issues, especially if you need long term narcotic medications

## 2016-09-12 NOTE — Progress Notes (Signed)
[facility]  PATIENT: [name] DOB: [birth year]  REFERRING CLINICIAN: Alona Bene, MD HISTORY FROM: patient  REASON FOR VISIT: new consult    HISTORICAL  CHIEF COMPLAINT:  Chief Complaint  Patient presents with  . Peripheral Neuropathy    He is here with his wife, [name], to discuss the results of his NCV/EMG and treatment options.    HISTORY OF PRESENT ILLNESS:   65 year old male here for evaluation of lower extremity numbness him a low back pain, right leg pain. Patient has hypertension, diabetes, hyperglycemia, heart disease. Patient has had 2 lumbar spine surgeries (2002 and 2016) and cervical spine surgery in 2002.  1998 patient was diagnosed with diabetes. Around this time he was having severe eye problems ultimately leading to bilateral blindness.  In early 2000 patient had gradual onset and progressive low back pain. Ultimately this continued in spite of conservative management, and resulted in patient opting for lumbar spine surgery. Unfortunately when this occurred in 8416, this was complicated by osteomyelitis including cervical epidural abscess. This required additional treatment and surgery. Ever since that time patient has had difficulty with his balance and walking. He had to "learn how to walk" again.   Over time patient's low back pain returned, and patient underwent another lumbar spine surgery on 10/26/2015. Following surgery patient's lower extremities, balance and walking significantly worsened. Patient had follow-up MRI in June 2017 which showed some progression of degenerative spine disease. Patient was referred to me for EMG nerve conduction study by his orthopedic surgeon, and the study showed severe peripheral neuropathy without definite electrodiagnostic evidence of lumbar radiculopathy, but study was limited due to poor relaxation and lumbar paraspinal evaluation.  Over the years patient has diabetes has been improving. In retrospect he  has had some numbness and pain in his hands for number of years as well as numbness in his feet for past 3 years.  Patient referred to me by PCP for further evaluation.  Patient denies any burning, pins and needles pain in his toes or feet. He has a "cardboard" sensation in his feet. He has some low back pain which is intermittent, worse when he stands up and tries to walk.   REVIEW OF SYSTEMS: Full 14 system review of systems performed and negative with exception of: As per history of present illness.  ALLERGIES: Allergies  Allergen Reactions  . Furosemide     pancreatitis  . Diamox [Acetazolamide] Itching  . Lisinopril Rash    HOME MEDICATIONS: Outpatient Medications Prior to Visit  Medication Sig Dispense Refill  . aspirin EC 81 MG tablet Take 1 tablet (81 mg total) by mouth daily.    . Blood Glucose Calibration (ONETOUCH VERIO) SOLN 1 Act by In Vitro route 2 (two) times daily. 1 each 11  . Blood Glucose Monitoring Suppl (PRODIGY VOICE BLOOD GLUCOSE) W/DEVICE KIT Use to check blood sugars twice a day Dx e11.9 1 kit 0  . carvedilol (COREG) 12.5 MG tablet TAKE 1 TABLET BY MOUTH TWICE A DAY WITH A MEAL 180 tablet 3  . ezetimibe (ZETIA) 10 MG tablet Take 1 tablet (10 mg total) by mouth daily. 90 tablet 3  . gabapentin (NEURONTIN) 300 MG capsule Take 300 mg by mouth daily.    Marland Kitchen glucose blood (ONETOUCH VERIO) test strip Use BID to check BS. DX E11.9 100 each 12  . HUMALOG MIX 75/25 (75-25) 100 UNIT/ML SUSP injection INJECT 85 UNITS IN THE MORNING AND 45 UNITS IN [facility] EVENING 100 mL 6  .  insulin lispro protamine-lispro (HUMALOG 75/25 MIX) (75-25) 100 UNIT/ML SUSP injection Inject 45-65 Units into the skin 2 (two) times daily with a meal. Use 45 units every morning and use 65 units every evening 4 vial 5  . INSULIN SYRINGE 1CC/29G (B-D INSULIN SYRINGE) 29G X 1/2" 1 ML MISC Use as directed with insulin vials 200 each 3  . isosorbide mononitrate (IMDUR) 120 MG 24 hr tablet TAKE 1 TABLET (120  MG TOTAL) BY MOUTH DAILY. 90 tablet 3  . JANUMET 50-1000 MG tablet TAKE 1 TABLET BY MOUTH TWICE A DAY WITH A MEAL 180 tablet 1  . losartan (COZAAR) 50 MG tablet Take 1 tablet (50 mg total) by mouth daily. 90 tablet 3  . nitroGLYCERIN (NITROSTAT) 0.4 MG SL tablet Place 1 tablet (0.4 mg total) under the tongue every 5 (five) minutes as needed for chest pain. 25 tablet 3  . omega-3 acid ethyl esters (LOVAZA) 1 g capsule TAKE 2 CAPSULES (2 G TOTAL) BY MOUTH 2 (TWO) TIMES DAILY. 120 capsule 11  . oxyCODONE-acetaminophen (PERCOCET/ROXICET) 5-325 MG tablet Take 2 tablets by mouth every 8 (eight) hours as needed for severe pain. 75 tablet 0  . pantoprazole (PROTONIX) 40 MG tablet Take 1 tablet (40 mg total) by mouth 2 (two) times daily. 180 tablet 3  . pravastatin (PRAVACHOL) 40 MG tablet TAKE 1 TABLET (40 MG TOTAL) BY MOUTH DAILY. DAILY 90 tablet 3  . PRODIGY LANCETS 28G MISC Use to check blood sugars twice a day Dx e11.9 100 each 3  . ranolazine (RANEXA) 500 MG 12 hr tablet Take 1 tablet (500 mg total) by mouth 2 (two) times daily. 180 tablet 3  . tamsulosin (FLOMAX) 0.4 MG CAPS capsule Take 1 capsule (0.4 mg total) by mouth daily. 15 capsule 1   No facility-administered medications prior to visit.     PAST MEDICAL HISTORY: Past Medical History:  Diagnosis Date  . Arthritis    "mild arthritis in hip"  . CAD (coronary artery disease)   . CHF (congestive heart failure) ([place])   . Complication of anesthesia    "lungs filled up with fluid"   . Diabetes mellitus    Type II  . Glaucoma   . Heart murmur   . HTN (hypertension)   . Hypercholesterolemia   . LBP (low back pain)   . Legally blind   . Peripheral neuropathy ([place])   . Sleep apnea   . Staph infection    "from back surgery"  . Trigger finger   . Ulcer   . Wears glasses    "to protect cornea" - legally blind    PAST SURGICAL HISTORY: Past Surgical History:  Procedure Laterality Date  . CHOLECYSTECTOMY  09/15/2012   Procedure:  LAPAROSCOPIC CHOLECYSTECTOMY WITH INTRAOPERATIVE CHOLANGIOGRAM;  Surgeon: Pedro Earls, MD;  Location: [place];  Service: General;  Laterality: N/A;  . EYE SURGERY Bilateral    cataracts  . INGUINAL HERNIA REPAIR     right  . LUMBAR FUSION    . LUMBAR LAMINECTOMY/DECOMPRESSION MICRODISCECTOMY N/A 10/26/2015   Procedure: RIGHT AND CENTRAL LUMBAR LAMINECTOMY L3-4, RIGHT L5-S1 LATERAL RECESS DECOMPRESSION;  Surgeon: Jessy Oto, MD;  Location: [place];  Service: Orthopedics;  Laterality: N/A;  . NECK SURGERY    . peptic ulcer dz surgery  pt was in his 20s   bleeding ulcer.   . Prosthetic Cornea placement, Right eye  2007   [place] Right 10/26/2015   Procedure: RELEASE  TRIGGER FINGER RIGHT THUMB;  Surgeon: Jessy Oto, MD;  Location: [place];  Service: Orthopedics;  Laterality: Right;    FAMILY HISTORY: Family History  Problem Relation Age of Onset  . Breast cancer Mother   . Colon cancer Mother   . Hypertension Mother   . Diabetes Mother   . Hypertension Father   . Colon cancer Other     Elevated Risk for  . Diabetes Sister   . Diabetes Brother     SOCIAL HISTORY:  Social History   Social History  . Marital status: Married    Spouse name: N/A  . Number of children: 3  . Years of education: 12   Occupational History  . disabled     blind   Social History Main Topics  . Smoking status: Never Smoker  . Smokeless tobacco: Never Used  . Alcohol use No  . Drug use: No  . Sexual activity: Not Currently   Other Topics Concern  . Not on file   Social History Narrative   Occupation: disabled, blind   Married   Regular Exercise-no   Lives at home with his wife.   Right-handed.   2-3 cups caffeine per day.        PHYSICAL EXAM  GENERAL EXAM/CONSTITUTIONAL: Vitals:  Vitals:   09/12/16 0947  BP: 100/61  Pulse: 68  Weight: 237 lb 12.8 oz (107.9 kg)  Height: 5' 10"  (1.778 m)     Body mass index is 34.12 kg/m.  No exam data  present  Patient is in no distress; well developed, nourished and groomed; neck is supple  CARDIOVASCULAR:  Examination of carotid arteries is normal; no carotid bruits  Regular rate and rhythm, no murmurs  Examination of peripheral vascular system by observation and palpation is normal  EYES:  RIGHT EYE SHRUNKEN AND POST-SURGICAL (BLIND)  LEFT EYE HAS MINIMAL LIGHT PERCEPTION, NO PUPILLARY REACTION  MUSCULOSKELETAL:  Gait, strength, tone, movements noted in Neurologic exam below  NEUROLOGIC: MENTAL STATUS:  No flowsheet data found.  awake, alert, oriented to person, place and time  recent and remote memory intact  normal attention and concentration  language fluent, comprehension intact, naming intact,   fund of knowledge appropriate  CRANIAL NERVE:   2nd - SEE ABOVE EYE EXAM; POST-SURGICAL  2nd, 3rd, 4th, 6th - pupils equal and reactive to light, visual fields full to confrontation, extraocular muscles intact, no nystagmus  5th - facial sensation symmetric  7th - facial strength symmetric  8th - hearing intact  9th - palate elevates symmetrically, uvula midline  11th - shoulder shrug symmetric  12th - tongue protrusion midline  MOTOR:   normal bulk and tone, full strength in the BUE EXCEPT BILATERAL TRICEPS, FINGER ABDUCTION, FINGER FLEXION (3-4) AND BLE (HF 4, KE/KF 4, RIGHT DF 3, LEFT DF 4)  SENSORY:   normal and symmetric to light touch; ABSENT VIB AT TOES AND ANKLES  COORDINATION:   fine finger movements normal  REFLEXES:   deep tendon reflexes TRACE IN BUE; ABSENT IN BLE  GAIT/STATION:   IN WHEEL CHAIR; SLOW TO RISE; SHORT STEPS; VERY UNSTEADY    DIAGNOSTIC DATA (LABS, IMAGING, TESTING) - I reviewed patient records, labs, notes, testing and imaging myself where available.  Lab Results  Component Value Date   WBC 6.4 07/26/2016   HGB 12.6 (L) 07/26/2016   HCT 39.1 07/26/2016   MCV 72.4 (L) 07/26/2016   PLT 228 07/26/2016       Component Value Date/Time  NA 136 07/26/2016 2334   K 3.7 07/26/2016 2334   CL 100 (L) 07/26/2016 2334   CO2 28 07/26/2016 2334   GLUCOSE 83 07/26/2016 2334   BUN 16 07/26/2016 2334   CREATININE 1.23 07/26/2016 2334   CALCIUM 9.4 07/26/2016 2334   PROT 7.2 07/26/2016 2334   ALBUMIN 3.8 07/26/2016 2334   AST 18 07/26/2016 2334   ALT 17 07/26/2016 2334   ALKPHOS 49 07/26/2016 2334   BILITOT 0.7 07/26/2016 2334   GFRNONAA 59 (L) 07/26/2016 2334   GFRAA >60 07/26/2016 2334   Lab Results  Component Value Date   CHOL 171 04/09/2016   HDL 28.20 (L) 04/09/2016   LDLCALC 117 (H) 04/09/2016   LDLDIRECT 82.7 04/04/2013   TRIG 127.0 04/09/2016   CHOLHDL 6 04/09/2016   Lab Results  Component Value Date   HGBA1C 6.4 08/05/2016   Lab Results  Component Value Date   VITAMINB12 410 04/09/2016   Lab Results  Component Value Date   TSH 1.50 04/09/2016   04/04/01 MRI lumbar spine 1.    BROAD BASED PROTRUSION AT L4-5 MORE PRONOUNCED TOWARDS THE RIGHT WHICH IN COMBINATION WITH FACET DEGENERATIVE CHANGE RESULTS IN RIGHT FORAMINAL AND LATERAL RECESS STENOSIS AT L4-5.  THIS MAY WELL BE THE CLINICALLY RELEVANT ABNORMALITY IN THIS PATIENT AND COULD EXPLAIN RIGHT LEG RADICULAR SYMPTOMATOLOGY. 2.    LEFT FORAMINAL STENOSIS AT L5-S1 LARGELY DUE TO DEGENERATIVE FACET DISEASE BUT ALSO CONTRIBUTED TO BY BULGING DISK MATERIAL.  THIS COULD AFFECT THE LEFT L5 NERVE ROOT.  08/08/01 MRI lumbar spine 1.  EVIDENCE OF POSTOPERATIVE INFECTION INVOLVING THE OPERATIVE APPROACH AND INVOLVING THE DEEP SPACES WITH EPIDURAL INFLAMMATORY CHANGES EXTENDING FROM L3TO S1.  THE EPIDURAL INFLAMMATORY CHANGES ARE REALLY QUITE PROMINENT AND SHOW COMPRESSIVE EFFECT UPON THE THECAL [place].  THERE ARE NOT LARGE NONENHANCING COMPONENTS TO SUGGEST PUS FILLED ABSCESSES BUT THIS IS PROBABLY DIFFUSE PHLEGMONOUS INFLAMMATION. TWO VIEW LUMBAR SPINE THE PATIENT HAS HAD HEMILAMINECTOMY ON THE RIGHT AT L4-5.  THERE IS SCOLIOSIS CONVEX TO THE  LEFT. I DO NOT SEE BONY DESTRUCTIVE CHANGES ASSOCIATED WITH INFECTION.  09/04/01 MRI cervical spine 1.  STATUS POST DECOMPRESSIVE LAMINECTOMY FROM C3 THROUGH C6 WITH IMPROVED INTERSPINAL EPIDURAL ABSCESS. 2.  EXTENSIVE DORSAL SPINOUS INFLAMMATORY CHANGE WORRISOME FOR A SOFT TISSUE ABSCESS/MYOSITIS/PHLEGMON MEASURING APPROXIMATELY 2 X 5 CM IN CROSS SECTION AND EXTENDING OVER A CEPHALOCAUDAL LENGTH OF 4 CM. 3.  STABLE PARACENTRAL DISC PROTRUSION C5-6 CENTRAL AND TO THE LEFT AND STABLE SMALL SOFT CENTRAL DISC PROTRUSION AT C6-7.  11/12/01 MRI cervical spine - NEGATIVE FOR CERVICAL EPIDURAL ABSCESS.  STATUS POST DECOMPRESSIVE LAMINECTOMY. DISC DEGENERATION IS SIMILAR TO THE PRIOR STUDY.   06/01/16 MRI lumbar spine [I reviewed images myself and agree with interpretation. -VRP]  1. Lumbar spondylosis and degenerative disc disease causing prominent impingement at L5-S1; moderate impingement at L3-4; and mild impingement at L2-3, as detailed above. Postoperative findings at multiple levels. Some of the right-sided findings at L3-4 are improved compared to the prior exam due to interval laminectomy and partial facetectomy. Findings at L2- 3 and L5-S1 are worsened from prior.  06/01/16 MRI brain [I reviewed images myself and agree with interpretation. -VRP]  1. No acute intracranial abnormality. 2. Minimal chronic small vessel ischemic disease.  08/01/16 EMG/NCS 1. Severe axonal sensorimotor polyneuropathy with active and chronic denervation. 2. Superimposed bilateral L5-S1 lumbar radiculopathies is possible given the clinical context and abnormal EMG findings in the lower extremities. Although no definite lumbar paraspinal muscle spontaneous activity was  noted, the absence of this activity does not totally exclude lumbar radiculopathy. In addition, even if abnormal lumbar spine spontaneous activity were to be seen, in the setting of prior lumbar surgery this would not necessarily confirm ongoing lumbar  radiculopathy problems.     ASSESSMENT AND PLAN  65 y.o. year old male here with long history of diabetes, status post bilateral blindness, with progressive lower extremity weakness, numbness, pain, low back pain. Overall patient's lower extremities are affected by severe diabetic peripheral neuropathy as well as concomitant lumbar degenerative spine disease and lumbar polyradiculopathies, status post 2 lumbar spine surgeries.   Dx:  1. Diabetic polyneuropathy associated with type 2 diabetes mellitus ([place])   2. Lumbar radiculopathy      PLAN: I spent 40 minutes of face to face time with patient. Greater than 50% of time was spent in counseling and coordination of care with patient. In summary we discussed his complex history of diabetes, neuropathy, multiple back surgeries, neck surgery. Overall advised conservative management in addition to explaining his extensive medical, neurologic history, test results, prognosis and treatment options. - continue diabetes treatment - if patient has burning, pins/needles pain in feet, then may use gabapentin for neuropathic pain treatment; currently patient does not have significant neuropathic pain - follow up with Dr. Louanne Skye or pain mgmt referral for low back pain / lumbar radiculopathy issues, if patient needs long term narcotic medications  Return for return to PCP and orthopedic surgery.    Penni Bombard, MD 08/15/538, 76:73 AM Certified in Neurology, Neurophysiology and Neuroimaging  [facility] [address], [place], [place] [address] [phone]

## 2016-10-29 IMAGING — MR MR LUMBAR SPINE WO/W CM
7 series · 48 of 48 positions shown · IV contrast (20ml multihance)
Comparison: CT abdomen and pelvis 12/14/2013.

CLINICAL DATA: Right hip and lower extremity pain. History of prior
lumbar surgery and osteomyelitis.

BUN and creatinine were obtained on site at [HOSPITAL] at
[HOSPITAL].
Results:  BUN 17 mg/dL,  Creatinine 1.2 mg/dL.
EXAM:
MRI LUMBAR SPINE WITHOUT AND WITH CONTRAST
TECHNIQUE: Multiplanar and multiecho pulse sequences of the lumbar spine were
obtained without and with intravenous contrast.
CONTRAST:  20 mL MULTIHANCE GADOBENATE DIMEGLUMINE 529 MG/ML IV SOLN

[Series 3: tirm sag · sagittal · 4.0mm · 0.55mm/px · 3 of 14 slices shown]
[im 1/14]
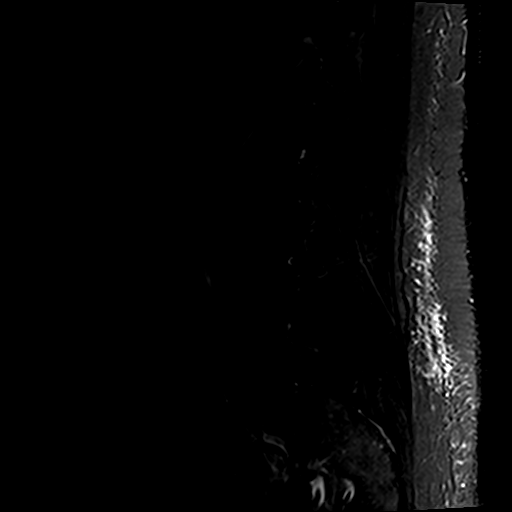
[im 7/14]
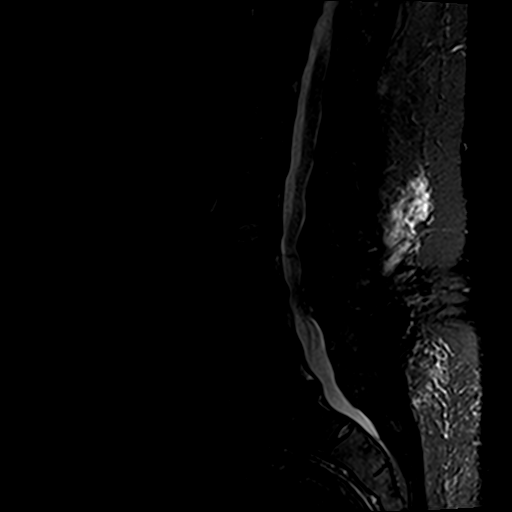
[im 14/14]
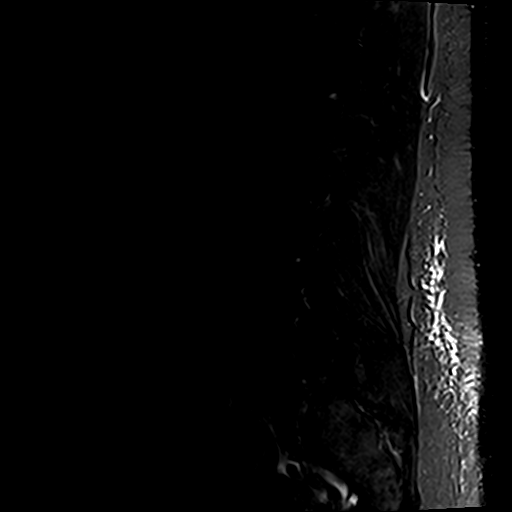

[Series 4: T1 · sagittal · 4.0mm · 0.88mm/px · 4 of 14 slices shown (1 of 2)]
[im 1/14]
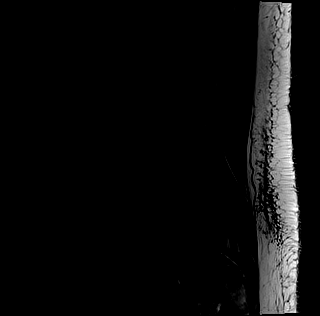
[im 5/14]
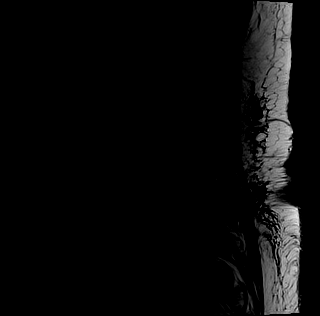
[im 9/14]
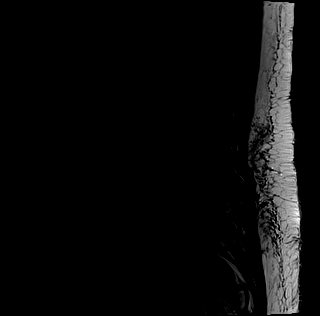
[im 14/14]
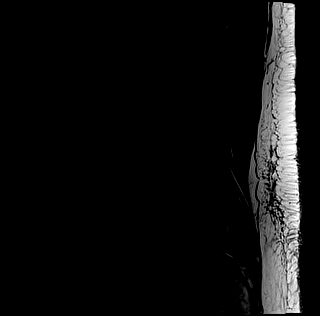

[Series 5: T1 · axial · 4.0mm · 0.74mm/px · z∈[-180,+25]mm · 11 of 38 slices shown (2 of 2)]
[im 1/38]
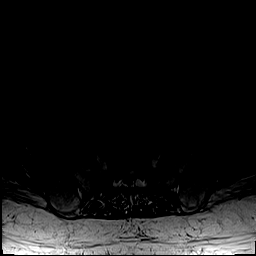
[im 4/38]
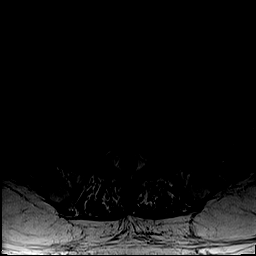
[im 8/38]
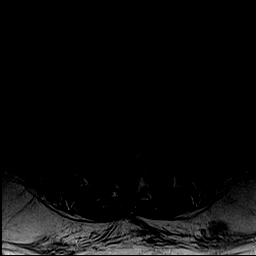
[im 12/38]
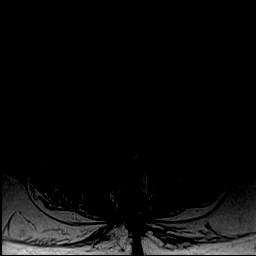
[im 15/38]
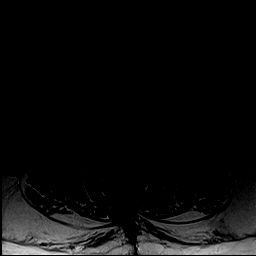
[im 19/38]
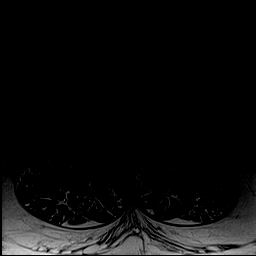
[im 23/38]
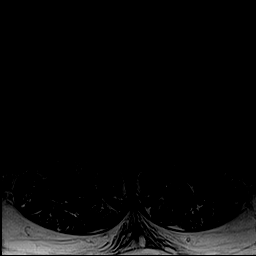
[im 26/38]
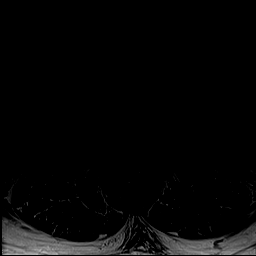
[im 30/38]
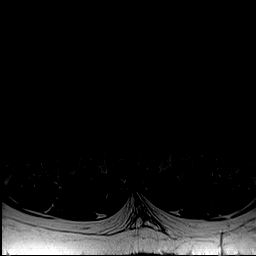
[im 34/38]
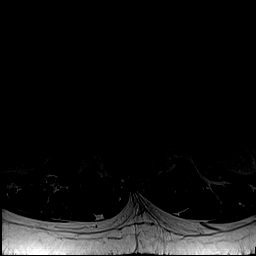
[im 38/38]
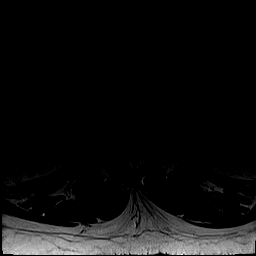

[Series 6: T2 · axial · 4.0mm · 0.74mm/px · z∈[-180,+25]mm · 11 of 38 slices shown (1 of 2)]
[im 1/38]
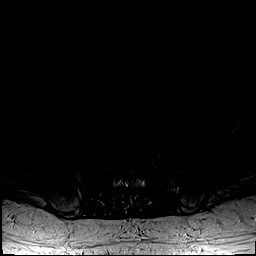
[im 4/38]
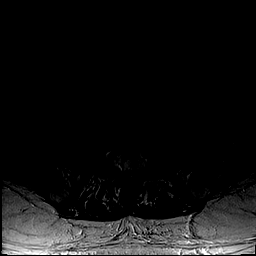
[im 8/38]
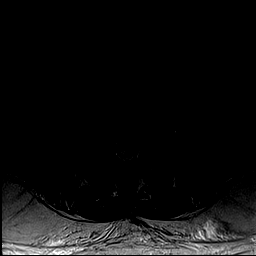
[im 12/38]
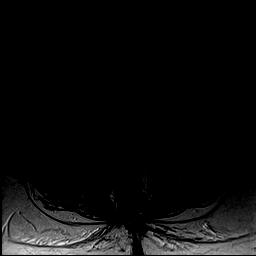
[im 15/38]
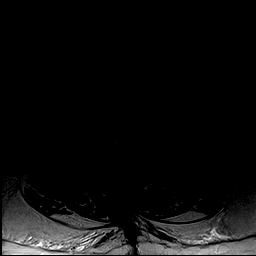
[im 19/38]
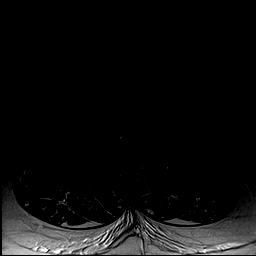
[im 23/38]
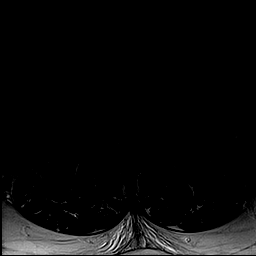
[im 26/38]
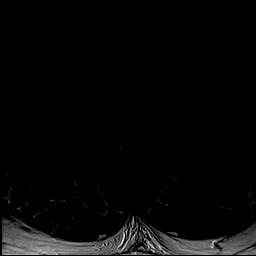
[im 30/38]
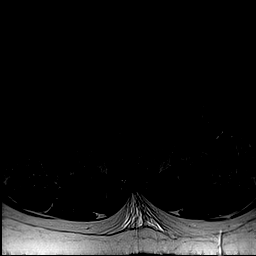
[im 34/38]
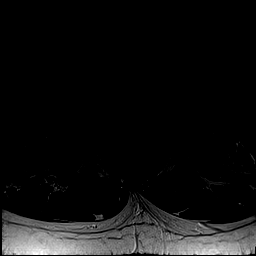
[im 38/38]
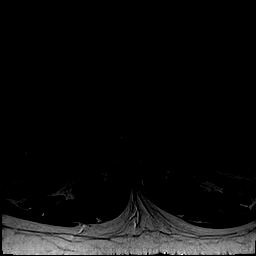

[Series 7: T2 · sagittal · 4.0mm · 0.88mm/px · 4 of 14 slices shown (2 of 2)]
[im 1/14]
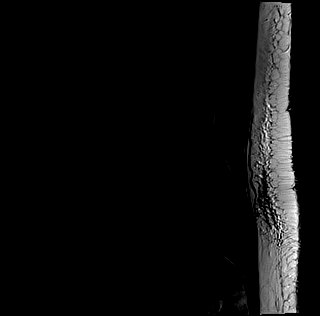
[im 5/14]
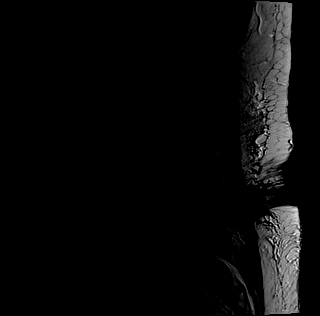
[im 9/14]
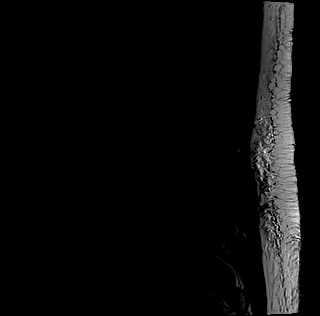
[im 14/14]
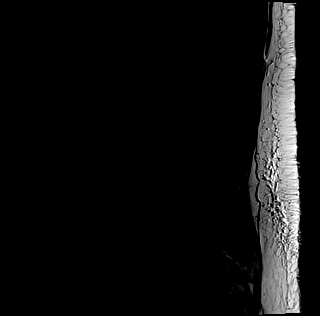

[Series 8: T1 fat-sat post-contrast · sagittal · 4.0mm · 0.88mm/px · 4 of 14 slices shown]
[im 1/14]
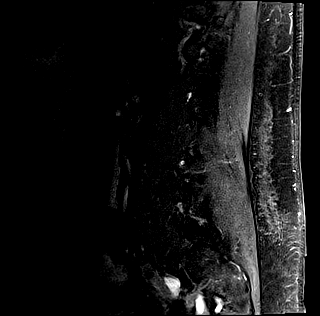
[im 5/14]
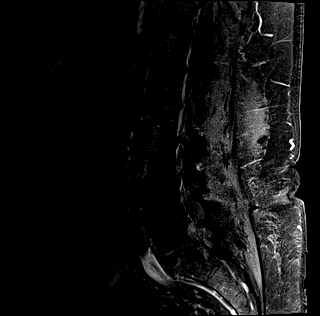
[im 9/14]
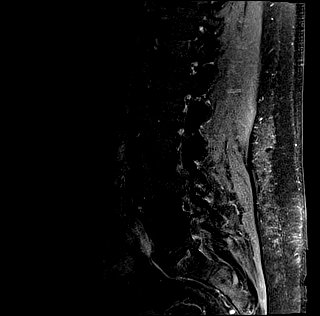
[im 14/14]
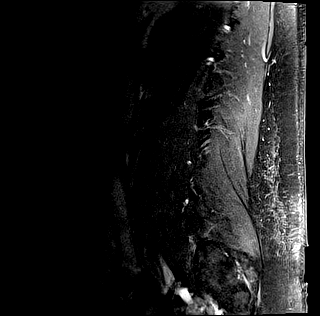

[Series 9: T1 post-contrast · axial · 4.0mm · 0.74mm/px · z∈[-180,+25]mm · 11 of 38 slices shown]
[im 1/38]
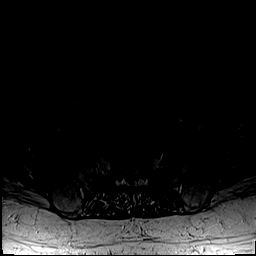
[im 4/38]
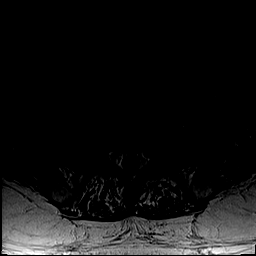
[im 8/38]
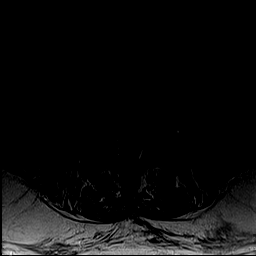
[im 12/38]
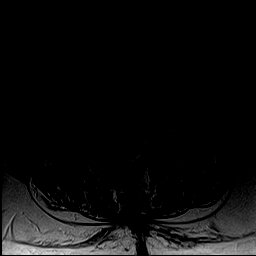
[im 15/38]
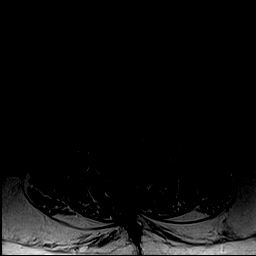
[im 19/38]
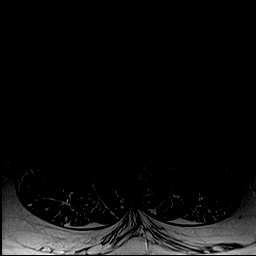
[im 23/38]
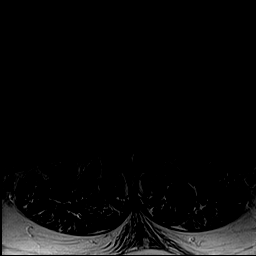
[im 26/38]
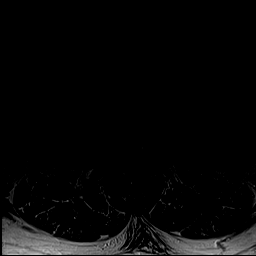
[im 30/38]
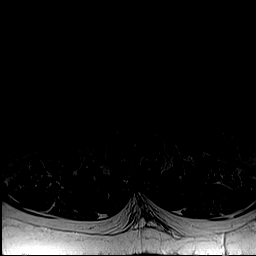
[im 34/38]
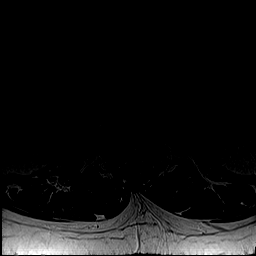
[im 38/38]
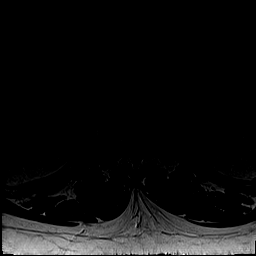

[48 of 48 positions shown; findings below may reference images not displayed]

FINDINGS: Vertebral body height and alignment are maintained. There is solid
interbody fusion across the L4-5 level. The facets also appear fused
at L4-5. Bone marrow signal is unremarkable. The conus medullaris is
normal in signal and position. Imaged intra-abdominal contents are
unremarkable.

The T10-11 and T11-12 levels are imaged in the sagittal plane only.
There is a small disc bulge at T10-11 but the central canal and
foramina appear open at each level.

T12-L1: Minimal disc bulge the left without central canal or
foraminal narrowing.

L1-2: Moderate facet arthropathy is identified. No disc bulge or
protrusion. The central canal and foramina are open.

L2-3: There is a shallow disc bulge with ligamentum flavum
thickening and moderate facet degenerative disease. There is mild
central canal stenosis. The foramina are open.

L3-4: Moderate to advanced bilateral facet degenerative change is
present. There is bulky ligamentum flavum thickening. The patient
has a diffuse broad-based disc bulge with a superimposed right
foraminal protrusion. There is severe right foraminal narrowing.
Moderately severe to severe central canal stenosis is also present.
The left foramen is mildly narrowed.

L4-5: Status post discectomy and fusion. The central canal and
foramina are patent.

L5-S1: There is advanced facet degenerative disease. The patient has
a diffuse broad-based disc bulge somewhat more prominent to the
right. Disc contacts both descending S1 roots and deflects the right
S1 root. The S1 roots do not appear compressed. The foramina are
severely narrowed.
IMPRESSION: Severe right foraminal narrowing at L3-4 due to disc. There is
moderately severe to severe central canal stenosis overall at this
level due to a combination of ligamentum flavum thickening and disc.

Disc contacts the descending S1 roots at L5-S1 and slightly deflects
the right S1 root. The foramina are severely narrowed at this level
due to disc.

Status post L4-5 fusion. The central canal and foramina are widely
patent.

## 2016-11-11 ENCOUNTER — Other Ambulatory Visit: Payer: Self-pay | Admitting: Internal Medicine

## 2016-11-11 DIAGNOSIS — G894 Chronic pain syndrome: Secondary | ICD-10-CM

## 2016-11-11 DIAGNOSIS — M5416 Radiculopathy, lumbar region: Secondary | ICD-10-CM

## 2016-11-11 MED ORDER — OXYCODONE-ACETAMINOPHEN 5-325 MG PO TABS: 2.0000 | ORAL_TABLET | Freq: Three times a day (TID) | ORAL | 0 refills | Status: DC | PRN

## 2016-11-11 NOTE — Telephone Encounter (Signed)
Pt informed rx is ready for pick up

## 2016-11-11 NOTE — Telephone Encounter (Signed)
Pt rq rx for oxycodone. Last rx was written on 08/05/2016 Please advise.

## 2016-11-18 ENCOUNTER — Encounter: Payer: Self-pay | Admitting: Cardiology

## 2016-11-18 ENCOUNTER — Ambulatory Visit (INDEPENDENT_AMBULATORY_CARE_PROVIDER_SITE_OTHER): Payer: Medicare Other | Admitting: Cardiology

## 2016-11-18 VITALS — BP 118/62 | HR 56 | Ht 70.0 in | Wt 234.6 lb

## 2016-11-18 DIAGNOSIS — I10 Essential (primary) hypertension: Secondary | ICD-10-CM | POA: Diagnosis not present

## 2016-11-18 DIAGNOSIS — E7800 Pure hypercholesterolemia, unspecified: Secondary | ICD-10-CM

## 2016-11-18 DIAGNOSIS — I5032 Chronic diastolic (congestive) heart failure: Secondary | ICD-10-CM

## 2016-11-18 DIAGNOSIS — H547 Unspecified visual loss: Secondary | ICD-10-CM

## 2016-11-18 DIAGNOSIS — I2510 Atherosclerotic heart disease of native coronary artery without angina pectoris: Secondary | ICD-10-CM

## 2016-11-18 NOTE — Progress Notes (Signed)
[place]. [address], [place], [place]  [address] Phone: [phone] Fax:  [phone]  Date:  11/18/2016   ID:  [name], [name] [birth year], MRN [id number]  PCP:  [name], MD   History of Present Illness: [name] is a 65 y.o. male male with a hx of diabetes, hypertension, hyperlipidemia, obesity, blindness, sleep apnea, angina, coronary artery disease with prior stenting to left circumflex.  Cardiac cath 10/2007 with PCI and BMS to proximal left circumflex by Dr Leonia Reeves. Right coronary artery medically managed not favorable for PCI.   Nuclear stress test August 2017 no ischemia.   Hospitalization in August 2017 for fatigue, chest pain, echocardiogram as well as nuclear stress test were normal, low risk. Ranexa was started.   Currently he is doing very well. No complaints, no chest pain. He has been able to maintain his weight loss from 290 pounds to 230 pounds.    Wt Readings from Last 3 Encounters:  11/18/16 234 lb 9.6 oz (106.4 kg)  09/12/16 237 lb 12.8 oz (107.9 kg)  08/14/16 231 lb 1.9 oz (104.8 kg)     Past Medical History:  Diagnosis Date  . Arthritis    "mild arthritis in hip"  . CAD (coronary artery disease)   . CHF (congestive heart failure) ([place])   . Complication of anesthesia    "lungs filled up with fluid"   . Diabetes mellitus    Type II  . Glaucoma   . Heart murmur   . HTN (hypertension)   . Hypercholesterolemia   . LBP (low back pain)   . Legally blind   . Peripheral neuropathy ([place])   . Sleep apnea   . Staph infection    "from back surgery"  . Trigger finger   . Ulcer ([place])   . Wears glasses    "to protect cornea" - legally blind    Past Surgical History:  Procedure Laterality Date  . CHOLECYSTECTOMY  09/15/2012   Procedure: LAPAROSCOPIC CHOLECYSTECTOMY WITH INTRAOPERATIVE CHOLANGIOGRAM;  Surgeon: Pedro Earls, MD;  Location: [facility];  Service: General;  Laterality: N/A;  . EYE SURGERY Bilateral    cataracts  .  INGUINAL HERNIA REPAIR     right  . LUMBAR FUSION    . LUMBAR LAMINECTOMY/DECOMPRESSION MICRODISCECTOMY N/A 10/26/2015   Procedure: RIGHT AND CENTRAL LUMBAR LAMINECTOMY L3-4, RIGHT L5-S1 LATERAL RECESS DECOMPRESSION;  Surgeon: Jessy Oto, MD;  Location: [facility];  Service: Orthopedics;  Laterality: N/A;  . NECK SURGERY    . peptic ulcer dz surgery  pt was in his 20s   bleeding ulcer.   . Prosthetic Cornea placement, Right eye  2007   [place] Right 10/26/2015   Procedure: RELEASE TRIGGER FINGER RIGHT THUMB;  Surgeon: Jessy Oto, MD;  Location: [facility];  Service: Orthopedics;  Laterality: Right;    Current Outpatient Prescriptions  Medication Sig Dispense Refill  . Blood Glucose Calibration (ONETOUCH VERIO) SOLN 1 Act by In Vitro route 2 (two) times daily. 1 each 11  . Blood Glucose Monitoring Suppl (PRODIGY VOICE BLOOD GLUCOSE) W/DEVICE KIT Use to check blood sugars twice a day Dx e11.9 1 kit 0  . carvedilol (COREG) 12.5 MG tablet TAKE 1 TABLET BY MOUTH TWICE A DAY WITH A MEAL 180 tablet 3  . ezetimibe (ZETIA) 10 MG tablet Take 1 tablet (10 mg total) by mouth daily. 90 tablet 3  . gabapentin (NEURONTIN) 300 MG  capsule Take 300 mg by mouth daily.    Marland Kitchen glucose blood (ONETOUCH VERIO) test strip Use BID to check BS. DX E11.9 100 each 12  . HUMALOG MIX 75/25 (75-25) 100 UNIT/ML SUSP injection INJECT 85 UNITS IN THE MORNING AND 45 UNITS IN THEH EVENING 100 mL 6  . insulin lispro protamine-lispro (HUMALOG 75/25 MIX) (75-25) 100 UNIT/ML SUSP injection Inject 45-65 Units into the skin 2 (two) times daily with a meal. Use 45 units every morning and use 65 units every evening 4 vial 5  . isosorbide mononitrate (IMDUR) 120 MG 24 hr tablet TAKE 1 TABLET (120 MG TOTAL) BY MOUTH DAILY. 90 tablet 3  . JANUMET 50-1000 MG tablet TAKE 1 TABLET BY MOUTH TWICE A DAY WITH A MEAL 180 tablet 1  . losartan (COZAAR) 50 MG tablet Take 1 tablet (50 mg total) by mouth daily. 90 tablet 3    . omega-3 acid ethyl esters (LOVAZA) 1 g capsule TAKE 2 CAPSULES (2 G TOTAL) BY MOUTH 2 (TWO) TIMES DAILY. 120 capsule 11  . oxyCODONE-acetaminophen (PERCOCET/ROXICET) 5-325 MG tablet Take 2 tablets by mouth every 8 (eight) hours as needed for severe pain. 75 tablet 0  . pantoprazole (PROTONIX) 40 MG tablet Take 1 tablet (40 mg total) by mouth 2 (two) times daily. 180 tablet 3  . pravastatin (PRAVACHOL) 40 MG tablet TAKE 1 TABLET (40 MG TOTAL) BY MOUTH DAILY. DAILY 90 tablet 3  . PRODIGY LANCETS 28G MISC Use to check blood sugars twice a day Dx e11.9 100 each 3  . ranolazine (RANEXA) 500 MG 12 hr tablet Take 1 tablet (500 mg total) by mouth 2 (two) times daily. 180 tablet 3  . tamsulosin (FLOMAX) 0.4 MG CAPS capsule Take 1 capsule (0.4 mg total) by mouth daily. 15 capsule 1  . nitroGLYCERIN (NITROSTAT) 0.4 MG SL tablet Place 1 tablet (0.4 mg total) under the tongue every 5 (five) minutes as needed for chest pain. 25 tablet 3   No current facility-administered medications for this visit.     Allergies:    Allergies  Allergen Reactions  . Furosemide     pancreatitis  . Diamox [Acetazolamide] Itching  . Lisinopril Rash    Social History:  The patient  reports that he has never smoked. He has never used smokeless tobacco. He reports that he does not drink alcohol or use drugs.   ROS:  Please see the history of present illness.   No fevers, no chills, no orthopnea, no PND   PHYSICAL EXAM: VS:  BP 118/62   Pulse (!) 56   Ht 5' 10"  (1.778 m)   Wt 234 lb 9.6 oz (106.4 kg)   BMI 33.66 kg/m  Well nourished, well developed, in no acute distress HEENT: Blind Neck: no JVD Cardiac:  normal S1, S2; RRR; no murmur Lungs:  clear to auscultation bilaterally, no wheezing, rhonchi or rales Abd: soft, nontender, no hepatomegalyObese Ext: no edema Skin: warm and dry Neuro: no focal abnormalities noted  EKG:  12/27/14-sinus rhythm, 75, poor R-wave progression  ECHO: 07/28/16 - Left ventricle:  The cavity size was normal. Wall thickness was   increased in a pattern of moderate LVH. Systolic function was   normal. The estimated ejection fraction was in the range of 60%   to 65%. Wall motion was normal; there were no regional wall   motion abnormalities. Doppler parameters are consistent with   abnormal left ventricular relaxation (grade 1 diastolic   dysfunction). Doppler parameters are consistent  with high   ventricular filling pressure. - Mitral valve: Calcified annulus. - Pericardium, extracardiac: A trivial pericardial effusion was   identified.  Impressions:  - Normal LV systolic function; moderate LVH; grade 1 diastolic   dysfunction with elevated LV filling pressure.  Nuclear stress test 07/27/16:  - Low risk, no ischemia, ejection fraction 56%  ASSESSMENT AND PLAN:  1. Coronary artery disease-no chest pain.  isosorbide to 120 mg once a day , Ranexa. Nuclear stress test August 2017 reassuring. 2. Diabetes- [name] managing. Hemoglobin A1c 6.4, excellent. 3. Obesity-great job with weight loss overall. 4. Hyperlipidemia-continue with statin. Refills given. LDL goal less than 100/70. 5. Blindness-no changes 6. 6 month follow-up  Signed, Candee Furbish, MD [facility]  11/18/2016 11:10 AM

## 2016-11-18 NOTE — Patient Instructions (Signed)
Medication Instructions:  The current medical regimen is effective;  continue present plan and medications.  Follow-Up: Follow up in 6 months with Katy Thompson, PA.  You will receive a letter in the mail 2 months before you are due.  Please call us when you receive this letter to schedule your follow up appointment.  Follow up in 1 year with Dr. Skains.  You will receive a letter in the mail 2 months before you are due.  Please call us when you receive this letter to schedule your follow up appointment.  If you need a refill on your cardiac medications before your next appointment, please call your pharmacy.  Thank you for choosing [facility]!!     

## 2016-11-25 ENCOUNTER — Ambulatory Visit: Payer: Medicare Other | Admitting: Podiatry

## 2016-12-03 ENCOUNTER — Telehealth: Payer: Self-pay | Admitting: Specialist

## 2016-12-09 ENCOUNTER — Other Ambulatory Visit (INDEPENDENT_AMBULATORY_CARE_PROVIDER_SITE_OTHER): Payer: Medicare Other

## 2016-12-09 ENCOUNTER — Ambulatory Visit (INDEPENDENT_AMBULATORY_CARE_PROVIDER_SITE_OTHER): Payer: Medicare Other | Admitting: Internal Medicine

## 2016-12-09 ENCOUNTER — Encounter: Payer: Self-pay | Admitting: Internal Medicine

## 2016-12-09 VITALS — BP 110/60 | HR 82 | Temp 98.6°F | Ht 70.0 in | Wt 233.5 lb

## 2016-12-09 DIAGNOSIS — D510 Vitamin B12 deficiency anemia due to intrinsic factor deficiency: Secondary | ICD-10-CM

## 2016-12-09 DIAGNOSIS — E118 Type 2 diabetes mellitus with unspecified complications: Secondary | ICD-10-CM

## 2016-12-09 DIAGNOSIS — E785 Hyperlipidemia, unspecified: Secondary | ICD-10-CM | POA: Diagnosis not present

## 2016-12-09 DIAGNOSIS — I10 Essential (primary) hypertension: Secondary | ICD-10-CM | POA: Diagnosis not present

## 2016-12-09 DIAGNOSIS — Z23 Encounter for immunization: Secondary | ICD-10-CM

## 2016-12-09 LAB — CBC WITH DIFFERENTIAL/PLATELET
BASOS ABS: 0 10*3/uL (ref 0.0–0.1)
Basophils Relative: 0.7 % (ref 0.0–3.0)
Eosinophils Absolute: 0.2 10*3/uL (ref 0.0–0.7)
Eosinophils Relative: 2.8 % (ref 0.0–5.0)
HCT: 38.7 % — ABNORMAL LOW (ref 39.0–52.0)
Hemoglobin: 12.6 g/dL — ABNORMAL LOW (ref 13.0–17.0)
LYMPHS ABS: 1.6 10*3/uL (ref 0.7–4.0)
LYMPHS PCT: 27.9 % (ref 12.0–46.0)
MCHC: 32.6 g/dL (ref 30.0–36.0)
MCV: 71.1 fl — AB (ref 78.0–100.0)
MONOS PCT: 7.3 % (ref 3.0–12.0)
Monocytes Absolute: 0.4 10*3/uL (ref 0.1–1.0)
NEUTROS PCT: 61.3 % (ref 43.0–77.0)
Neutro Abs: 3.4 10*3/uL (ref 1.4–7.7)
Platelets: 249 10*3/uL (ref 150.0–400.0)
RBC: 5.45 Mil/uL (ref 4.22–5.81)
RDW: 15 % (ref 11.5–15.5)
WBC: 5.6 10*3/uL (ref 4.0–10.5)

## 2016-12-09 LAB — COMPREHENSIVE METABOLIC PANEL
ALK PHOS: 60 U/L (ref 39–117)
ALT: 12 U/L (ref 0–53)
AST: 12 U/L (ref 0–37)
Albumin: 4.1 g/dL (ref 3.5–5.2)
BILIRUBIN TOTAL: 0.6 mg/dL (ref 0.2–1.2)
BUN: 12 mg/dL (ref 6–23)
CO2: 28 mEq/L (ref 19–32)
CREATININE: 1.07 mg/dL (ref 0.40–1.50)
Calcium: 9 mg/dL (ref 8.4–10.5)
Chloride: 105 mEq/L (ref 96–112)
GFR: 88.57 mL/min (ref >60.00)
GLUCOSE: 105 mg/dL — AB (ref 70–99)
Potassium: 4.3 mEq/L (ref 3.5–5.1)
Sodium: 139 mEq/L (ref 135–145)
TOTAL PROTEIN: 7.3 g/dL (ref 6.0–8.3)

## 2016-12-09 LAB — LIPID PANEL
Cholesterol: 156 mg/dL (ref 0–200)
HDL: 33.5 mg/dL — AB (ref >39.00)
LDL Cholesterol: 93 mg/dL (ref 0–99)
NONHDL: 122.87
Total CHOL/HDL Ratio: 5
Triglycerides: 148 mg/dL (ref 0.0–149.0)
VLDL: 29.6 mg/dL (ref 0.0–40.0)

## 2016-12-09 LAB — HEMOGLOBIN A1C: HEMOGLOBIN A1C: 6.5 % (ref 4.6–6.5)

## 2016-12-09 MED ORDER — CYANOCOBALAMIN 2000 MCG PO TABS: 2000.0000 ug | ORAL_TABLET | Freq: Every day | ORAL | 3 refills | Status: DC

## 2016-12-09 MED ORDER — ZOSTER VACCINE LIVE 19400 UNT/0.65ML ~~LOC~~ SUSR: 0.6500 mL | Freq: Once | SUBCUTANEOUS | 0 refills | Status: AC

## 2016-12-09 NOTE — Progress Notes (Signed)
Pre visit review using our clinic review tool, if applicable. No additional management support is needed unless otherwise documented below in the visit note. 

## 2016-12-09 NOTE — Progress Notes (Signed)
Subjective:  Patient ID: [name], male    DOB: 24-Dec-1950  Age: 65 y.o. MRN: [id number]  CC: Hyperlipidemia; Hypertension; Anemia; and Diabetes   HPI [name] presents for f/up, He tells me his blood pressure and blood sugar have been well controlled and he has had no concerning symptoms. He is followed for chronic anemia and B12 deficiency and he tells me he has not been taking a B12 supplement. He does complain of chronic numbness in both feet. He is not aware of any sources of blood loss. He is due for his annual exam and needs a referral to [facility] ophthalmology.  Outpatient Medications Prior to Visit  Medication Sig Dispense Refill  . Blood Glucose Calibration (ONETOUCH VERIO) SOLN 1 Act by In Vitro route 2 (two) times daily. 1 each 11  . Blood Glucose Monitoring Suppl (PRODIGY VOICE BLOOD GLUCOSE) W/DEVICE KIT Use to check blood sugars twice a day Dx e11.9 1 kit 0  . carvedilol (COREG) 12.5 MG tablet TAKE 1 TABLET BY MOUTH TWICE A DAY WITH A MEAL 180 tablet 3  . ezetimibe (ZETIA) 10 MG tablet Take 1 tablet (10 mg total) by mouth daily. 90 tablet 3  . gabapentin (NEURONTIN) 300 MG capsule Take 300 mg by mouth daily.    Marland Kitchen glucose blood (ONETOUCH VERIO) test strip Use BID to check BS. DX E11.9 100 each 12  . HUMALOG MIX 75/25 (75-25) 100 UNIT/ML SUSP injection INJECT 85 UNITS IN THE MORNING AND 45 UNITS IN THEH EVENING 100 mL 6  . insulin lispro protamine-lispro (HUMALOG 75/25 MIX) (75-25) 100 UNIT/ML SUSP injection Inject 45-65 Units into the skin 2 (two) times daily with a meal. Use 45 units every morning and use 65 units every evening 4 vial 5  . isosorbide mononitrate (IMDUR) 120 MG 24 hr tablet TAKE 1 TABLET (120 MG TOTAL) BY MOUTH DAILY. 90 tablet 3  . JANUMET 50-1000 MG tablet TAKE 1 TABLET BY MOUTH TWICE A DAY WITH A MEAL 180 tablet 1  . losartan (COZAAR) 50 MG tablet Take 1 tablet (50 mg total) by mouth daily. 90 tablet 3  . omega-3 acid ethyl esters (LOVAZA) 1 g capsule  TAKE 2 CAPSULES (2 G TOTAL) BY MOUTH 2 (TWO) TIMES DAILY. 120 capsule 11  . oxyCODONE-acetaminophen (PERCOCET/ROXICET) 5-325 MG tablet Take 2 tablets by mouth every 8 (eight) hours as needed for severe pain. 75 tablet 0  . pantoprazole (PROTONIX) 40 MG tablet Take 1 tablet (40 mg total) by mouth 2 (two) times daily. 180 tablet 3  . pravastatin (PRAVACHOL) 40 MG tablet TAKE 1 TABLET (40 MG TOTAL) BY MOUTH DAILY. DAILY 90 tablet 3  . PRODIGY LANCETS 28G MISC Use to check blood sugars twice a day Dx e11.9 100 each 3  . ranolazine (RANEXA) 500 MG 12 hr tablet Take 1 tablet (500 mg total) by mouth 2 (two) times daily. 180 tablet 3  . tamsulosin (FLOMAX) 0.4 MG CAPS capsule Take 1 capsule (0.4 mg total) by mouth daily. 15 capsule 1  . nitroGLYCERIN (NITROSTAT) 0.4 MG SL tablet Place 1 tablet (0.4 mg total) under the tongue every 5 (five) minutes as needed for chest pain. 25 tablet 3   No facility-administered medications prior to visit.     ROS Review of Systems  Constitutional: Negative.  Negative for activity change, appetite change, chills, fatigue and unexpected weight change.  HENT: Negative.   Eyes: Positive for visual disturbance (blindness).  Respiratory: Negative for apnea, cough, chest  tightness, shortness of breath and stridor.   Cardiovascular: Negative.  Negative for chest pain, palpitations and leg swelling.  Gastrointestinal: Negative for abdominal pain, blood in stool, constipation, diarrhea, nausea and vomiting.  Endocrine: Negative for cold intolerance, heat intolerance, polydipsia, polyphagia and polyuria.  Genitourinary: Negative.  Negative for difficulty urinating.  Musculoskeletal: Negative for back pain and myalgias.  Skin: Negative.  Negative for color change and rash.  Neurological: Positive for numbness. Negative for dizziness and weakness.  Hematological: Negative.  Negative for adenopathy. Does not bruise/bleed easily.  Psychiatric/Behavioral: Negative.      Objective:  BP 110/60 (BP Location: Left Arm, Patient Position: Sitting, Cuff Size: Large)   Pulse 82   Temp 98.6 F (37 C) (Oral)   Ht 5' 10"  (1.778 m)   Wt 233 lb 8 oz (105.9 kg)   SpO2 96%   BMI 33.50 kg/m   BP Readings from Last 3 Encounters:  12/10/16 (!) 152/71  12/09/16 110/60  11/18/16 118/62    Wt Readings from Last 3 Encounters:  12/09/16 233 lb 8 oz (105.9 kg)  11/18/16 234 lb 9.6 oz (106.4 kg)  09/12/16 237 lb 12.8 oz (107.9 kg)    Physical Exam  Constitutional: He is oriented to person, place, and time. No distress.  HENT:  Mouth/Throat: Oropharynx is clear and moist. No oropharyngeal exudate.  Eyes: Conjunctivae are normal. Right eye exhibits no discharge. Left eye exhibits no discharge. No scleral icterus.  Neck: Normal range of motion. Neck supple. No JVD present. No tracheal deviation present. No thyromegaly present.  Cardiovascular: Normal rate, regular rhythm, normal heart sounds and intact distal pulses.  Exam reveals no gallop and no friction rub.   No murmur heard. Pulmonary/Chest: Effort normal and breath sounds normal. No stridor. No respiratory distress. He has no wheezes. He has no rales. He exhibits no tenderness.  Abdominal: Soft. Bowel sounds are normal. He exhibits no distension and no mass. There is no tenderness. There is no rebound and no guarding.  Musculoskeletal: Normal range of motion. He exhibits no edema, tenderness or deformity.  Lymphadenopathy:    He has no cervical adenopathy.  Neurological: He is oriented to person, place, and time.  Skin: Skin is warm and dry. No rash noted. He is not diaphoretic. No pallor.  Vitals reviewed.   Lab Results  Component Value Date   WBC 5.6 12/09/2016   HGB 12.6 (L) 12/09/2016   HCT 38.7 (L) 12/09/2016   PLT 249.0 12/09/2016   GLUCOSE 105 (H) 12/09/2016   CHOL 156 12/09/2016   TRIG 148.0 12/09/2016   HDL 33.50 (L) 12/09/2016   LDLDIRECT 82.7 04/04/2013   LDLCALC 93 12/09/2016   ALT  12 12/09/2016   AST 12 12/09/2016   NA 139 12/09/2016   K 4.3 12/09/2016   CL 105 12/09/2016   CREATININE 1.07 12/09/2016   BUN 12 12/09/2016   CO2 28 12/09/2016   TSH 1.50 04/09/2016   PSA 1.99 04/09/2016   INR 0.95 08/17/2010   HGBA1C 6.5 12/09/2016   MICROALBUR <0.7 04/09/2016    Nm Myocar Multi W/spect W/wall Motion / Ef  Result Date: 07/27/2016 CLINICAL DATA:  65 year old male with chest pain and shortness of breath EXAM: MYOCARDIAL IMAGING WITH SPECT (REST AND PHARMACOLOGIC-STRESS) GATED LEFT VENTRICULAR WALL MOTION STUDY LEFT VENTRICULAR EJECTION FRACTION TECHNIQUE: Standard myocardial SPECT imaging was performed after resting intravenous injection of 10 mCi Tc-51mtetrofosmin. Subsequently, intravenous infusion of Lexiscan was performed under the supervision of the Cardiology staff. At peak  effect of the drug, 30 mCi Tc-12mtetrofosmin was injected intravenously and standard myocardial SPECT imaging was performed. Quantitative gated imaging was also performed to evaluate left ventricular wall motion, and estimate left ventricular ejection fraction. COMPARISON:  Prior chest x-ray 07/26/2016 FINDINGS: Perfusion: No decreased activity in the left ventricle on stress imaging to suggest reversible ischemia or infarction. Wall Motion: Normal left ventricular wall motion. No left ventricular dilation. Left Ventricular Ejection Fraction: 56 % End diastolic volume 62 ml End systolic volume 27 ml IMPRESSION: 1. No reversible ischemia or infarction. 2. Normal left ventricular wall motion. 3. Left ventricular ejection fraction 56% 4. Non invasive risk stratification*: Low *2012 Appropriate Use Criteria for Coronary Revascularization Focused Update: J Am Coll Cardiol. 22671;24(5):809-983 http://[email or web address]?[email or web address] Electronically Signed   By: HJacqulynn CadetM.D.   On: 07/27/2016 15:36    Assessment & Plan:   RHarlwas seen today for hyperlipidemia, hypertension,  anemia and diabetes.  Diagnoses and all orders for this visit:  Vitamin B12 deficiency anemia due to intrinsic factor deficiency- His anemia has not improved and he has a borderline low B12 level, will start high-dose B12 replacement therapy -     CBC with Differential/Platelet; Future -     cyanocobalamin 2000 MCG tablet; Take 1 tablet (2,000 mcg total) by mouth daily.  Hyperlipidemia LDL goal <70- he is achieved his LDL goal is doing well on the statin. -     Lipid panel; Future  Type 2 diabetes mellitus with complication, without long-term current use of insulin ([name]- his A1c is at 6.5%, his blood sugars are adequately well-controlled with no medications. -     Comprehensive metabolic panel; Future -     Hemoglobin A1c; Future -     Zoster Vaccine Live, PF, (ZOSTAVAX) 138250UNT/0.65ML injection; Inject 19,400 Units into the skin once. -     Ambulatory referral to Ophthalmology  HYPERTENSION, BENIGN ESSENTIAL- his blood pressure is well-controlled, electrolytes and renal function are stable. -     Comprehensive metabolic panel; Future  Need for prophylactic vaccination and inoculation against influenza -     Flu vaccine HIGH DOSE PF (Fluzone High dose)  Morbid [place] -     DME Wheelchair manual -     For home use only DME 4 wheeled rolling walker with seat   I am having [name] start on Zoster Vaccine Live (PF) and cyanocobalamin. I am also having him maintain his ONETOUCH VERIO, PRODIGY VOICE BLOOD GLUCOSE, tamsulosin, omega-3 acid ethyl esters, JANUMET, losartan, isosorbide mononitrate, pravastatin, carvedilol, ezetimibe, glucose blood, PRODIGY LANCETS 28G, insulin lispro protamine-lispro, gabapentin, pantoprazole, ranolazine, nitroGLYCERIN, HUMALOG MIX 75/25, and oxyCODONE-acetaminophen.  Meds ordered this encounter  Medications  . Zoster Vaccine Live, PF, (ZOSTAVAX) [id number]/0.65ML injection    Sig: Inject 19,400 Units into the skin once.    Dispense:  1 each     Refill:  0  . cyanocobalamin 2000 MCG tablet    Sig: Take 1 tablet (2,000 mcg total) by mouth daily.    Dispense:  90 tablet    Refill:  3     Follow-up: Return in about 6 months (around 06/09/2017).  TScarlette Calico MD

## 2016-12-09 NOTE — Patient Instructions (Signed)

## 2016-12-10 ENCOUNTER — Ambulatory Visit (INDEPENDENT_AMBULATORY_CARE_PROVIDER_SITE_OTHER): Payer: Medicare Other | Admitting: Podiatry

## 2016-12-10 ENCOUNTER — Encounter: Payer: Self-pay | Admitting: Podiatry

## 2016-12-10 ENCOUNTER — Encounter: Payer: Self-pay | Admitting: Internal Medicine

## 2016-12-10 VITALS — BP 152/71 | HR 75 | Resp 18

## 2016-12-10 DIAGNOSIS — B351 Tinea unguium: Secondary | ICD-10-CM

## 2016-12-10 DIAGNOSIS — E1151 Type 2 diabetes mellitus with diabetic peripheral angiopathy without gangrene: Secondary | ICD-10-CM | POA: Diagnosis not present

## 2016-12-10 DIAGNOSIS — G629 Polyneuropathy, unspecified: Secondary | ICD-10-CM

## 2016-12-10 NOTE — Patient Instructions (Signed)

## 2016-12-11 NOTE — Progress Notes (Signed)
Patient ID: [name], male   DOB: 1951-07-02, 65 y.o.   MRN: [id number]    Subjective This patient today presents today complaining of thick discolored toenails in the right and left feet that have gradually become more deformed over time. Patient has not been able to adequately trim his toenails and is requesting debridement of his toenails. He does not recall any recent podiatric care. He is a known type II diabetic and denies any history of ulceration or amputation  States he has a pending dropfoot ordered by his physician for right his right lower extremity  Patient's wife present in the treatment room today       Objective:  Orientated 3  Vascular: Bilateral peripheral pitting edema DP pulses 0/4 bilaterally PT pulses 0/4 bilaterally Capillary reflex within normal limits  Neurological: Sensation to 10 g monofilament wire intact 2/5 right and 3/5 left Vibratory sensation nonreactive bilaterally Ankle reflexes weakly reactive bilaterally  Dermatological: No open skin lesions noted bilaterally Dry skin bilaterally The toenails are elongated, brittle, discolored, hypertrophic 6-10  Musculoskeletal: No deformities noted bilaterally There is no restriction ankle, subtalar, midtarsal joints bilaterally Dorsi flexion right 3/5 and 5/5 left Plantar flexion right 4/5 right 5/5 left   Assessment: Diabetic peripheral arterial disease Diabetic peripheral neuropathy Mycotic toenails 6-10 Mild dropfoot deformity right with pending AFO ordered by another physician per patient  Plan: Today I reviewed the results of examination today made patient aware that he had decreased circulation and feeling in his right and left feet.. I discussed generalized diabetic foot care and provided information and after visit summary  The toenails 6-10 were debrided mechanically and electrically without any bleeding  Reappoint 3 months

## 2016-12-24 DIAGNOSIS — M5416 Radiculopathy, lumbar region: Secondary | ICD-10-CM | POA: Diagnosis not present

## 2016-12-24 DIAGNOSIS — R269 Unspecified abnormalities of gait and mobility: Secondary | ICD-10-CM | POA: Diagnosis not present

## 2016-12-24 DIAGNOSIS — E119 Type 2 diabetes mellitus without complications: Secondary | ICD-10-CM | POA: Diagnosis not present

## 2017-01-03 ENCOUNTER — Other Ambulatory Visit: Payer: Self-pay | Admitting: Internal Medicine

## 2017-01-24 DIAGNOSIS — R269 Unspecified abnormalities of gait and mobility: Secondary | ICD-10-CM | POA: Diagnosis not present

## 2017-01-24 DIAGNOSIS — M5416 Radiculopathy, lumbar region: Secondary | ICD-10-CM | POA: Diagnosis not present

## 2017-01-24 DIAGNOSIS — E119 Type 2 diabetes mellitus without complications: Secondary | ICD-10-CM | POA: Diagnosis not present

## 2017-01-28 ENCOUNTER — Other Ambulatory Visit (INDEPENDENT_AMBULATORY_CARE_PROVIDER_SITE_OTHER): Payer: Self-pay | Admitting: Specialist

## 2017-01-29 ENCOUNTER — Other Ambulatory Visit (INDEPENDENT_AMBULATORY_CARE_PROVIDER_SITE_OTHER): Payer: Self-pay | Admitting: Specialist

## 2017-01-29 ENCOUNTER — Other Ambulatory Visit (INDEPENDENT_AMBULATORY_CARE_PROVIDER_SITE_OTHER): Payer: Self-pay | Admitting: Radiology

## 2017-01-29 MED ORDER — TRAMADOL HCL 50 MG PO TABS: 50.0000 mg | ORAL_TABLET | Freq: Three times a day (TID) | ORAL | 3 refills | Status: DC | PRN

## 2017-01-30 NOTE — Progress Notes (Signed)
Called rx to [facility] on [address]

## 2017-02-10 NOTE — Telephone Encounter (Signed)
Done 01/29/2017

## 2017-02-17 ENCOUNTER — Telehealth: Payer: Self-pay

## 2017-02-17 NOTE — Telephone Encounter (Signed)
Form to participate in [facility] completed and given to PCP to sign.

## 2017-02-18 NOTE — Telephone Encounter (Signed)
Faxed back to [facility].

## 2017-02-21 DIAGNOSIS — M5416 Radiculopathy, lumbar region: Secondary | ICD-10-CM | POA: Diagnosis not present

## 2017-02-21 DIAGNOSIS — R269 Unspecified abnormalities of gait and mobility: Secondary | ICD-10-CM | POA: Diagnosis not present

## 2017-02-21 DIAGNOSIS — E119 Type 2 diabetes mellitus without complications: Secondary | ICD-10-CM | POA: Diagnosis not present

## 2017-03-04 ENCOUNTER — Encounter: Payer: Self-pay | Admitting: Internal Medicine

## 2017-03-04 DIAGNOSIS — E1142 Type 2 diabetes mellitus with diabetic polyneuropathy: Secondary | ICD-10-CM

## 2017-03-04 DIAGNOSIS — M48062 Spinal stenosis, lumbar region with neurogenic claudication: Secondary | ICD-10-CM

## 2017-03-11 ENCOUNTER — Encounter: Payer: Self-pay | Admitting: Podiatry

## 2017-03-11 ENCOUNTER — Ambulatory Visit (INDEPENDENT_AMBULATORY_CARE_PROVIDER_SITE_OTHER): Payer: PPO | Admitting: Podiatry

## 2017-03-11 DIAGNOSIS — L603 Nail dystrophy: Secondary | ICD-10-CM | POA: Diagnosis not present

## 2017-03-11 DIAGNOSIS — M25371 Other instability, right ankle: Secondary | ICD-10-CM

## 2017-03-11 DIAGNOSIS — E0843 Diabetes mellitus due to underlying condition with diabetic autonomic (poly)neuropathy: Secondary | ICD-10-CM | POA: Diagnosis not present

## 2017-03-11 DIAGNOSIS — B351 Tinea unguium: Secondary | ICD-10-CM | POA: Diagnosis not present

## 2017-03-11 DIAGNOSIS — M21371 Foot drop, right foot: Secondary | ICD-10-CM

## 2017-03-11 DIAGNOSIS — M79609 Pain in unspecified limb: Secondary | ICD-10-CM

## 2017-03-11 DIAGNOSIS — L608 Other nail disorders: Secondary | ICD-10-CM

## 2017-03-16 ENCOUNTER — Ambulatory Visit: Payer: PPO | Admitting: *Deleted

## 2017-03-18 NOTE — Progress Notes (Unsigned)
Patient presents for brace casting per Dr. Amalia Hailey. Patient was assessed w/ foot drop due to non cva neurological disorder.  Patient also has mild forefoot supinatus due to weakened anterior tib upon weight bearing).    In discussing options for indications, patient chose Newell style split upright w/ tamarack dorsi assist right.    Patient was cast with STS sock suspended with varus corrected.  Patient tolerated procedures well.

## 2017-03-18 NOTE — Progress Notes (Addendum)
   SUBJECTIVE Patient with a history of diabetes mellitus presents to office today complaining of elongated, thickened nails. Pain while ambulating in shoes. Patient is unable to trim their own nails.  Patient also complains of loss of dorsiflexion to the right foot. He states that he is of a constant risk for falling and tripping. Patient feels unsteady in his gait.  OBJECTIVE General Patient is awake, alert, and oriented x 3 and in no acute distress. Derm Skin is dry and supple bilateral. Negative open lesions or macerations. Remaining integument unremarkable. Nails are tender, long, thickened and dystrophic with subungual debris, consistent with onychomycosis, 1-5 bilateral. No signs of infection noted. Vasc  DP and PT pedal pulses palpable bilaterally. Temperature gradient within normal limits.  Neuro Epicritic and protective threshold sensation diminished bilaterally.  Musculoskeletal Exam No symptomatic pedal deformities noted bilateral. Muscular strength within normal limits.  ASSESSMENT 1. Diabetes Mellitus w/ peripheral neuropathy 2. Onychomycosis of nail due to dermatophyte bilateral 3. Foot drop deformity right foot 4. Ankle instability right  PLAN OF CARE 1. Patient evaluated today. 2. Instructed to maintain good pedal hygiene and foot care. Stressed importance of controlling blood sugar.  3. Mechanical debridement of nails 1-5 bilaterally performed using a nail nipper. Filed with dremel without incident.  4. Today were going to set up an appointment with [facility] for a custom molded ankle foot orthosis or possible balance brace. 5. Authorization for diabetic shoes initiated today 6. Return to clinic in 3 months with Dr. Amalia Hailey for routine nail care  Prosthetic charges placed as per Liliane Channel, Pedorthist    Edrick Kins, DPM [facility]  Dr. Edrick Kins, [address]                                        [place], [place] [address]                  Office [phone]  Fax [phone]

## 2017-03-24 DIAGNOSIS — E119 Type 2 diabetes mellitus without complications: Secondary | ICD-10-CM | POA: Diagnosis not present

## 2017-03-24 DIAGNOSIS — R269 Unspecified abnormalities of gait and mobility: Secondary | ICD-10-CM | POA: Diagnosis not present

## 2017-03-24 DIAGNOSIS — M5416 Radiculopathy, lumbar region: Secondary | ICD-10-CM | POA: Diagnosis not present

## 2017-03-31 ENCOUNTER — Telehealth: Payer: Self-pay

## 2017-03-31 ENCOUNTER — Other Ambulatory Visit: Payer: Self-pay | Admitting: Internal Medicine

## 2017-03-31 MED ORDER — OMEGA-3-ACID ETHYL ESTERS 1 G PO CAPS: 2.0000 g | ORAL_CAPSULE | Freq: Two times a day (BID) | ORAL | 2 refills | Status: DC

## 2017-03-31 NOTE — Telephone Encounter (Signed)
[facility] sent [facility] r[facility] a 90 day supply for Omega 3 supplement  ERX sent.

## 2017-04-06 ENCOUNTER — Other Ambulatory Visit: Payer: PPO

## 2017-04-09 ENCOUNTER — Other Ambulatory Visit: Payer: Self-pay | Admitting: Physician Assistant

## 2017-04-17 ENCOUNTER — Ambulatory Visit (INDEPENDENT_AMBULATORY_CARE_PROVIDER_SITE_OTHER): Payer: Medicaid Other | Admitting: *Deleted

## 2017-04-17 DIAGNOSIS — E0843 Diabetes mellitus due to underlying condition with diabetic autonomic (poly)neuropathy: Secondary | ICD-10-CM | POA: Diagnosis not present

## 2017-04-17 DIAGNOSIS — M21371 Foot drop, right foot: Secondary | ICD-10-CM | POA: Diagnosis not present

## 2017-04-17 DIAGNOSIS — R269 Unspecified abnormalities of gait and mobility: Secondary | ICD-10-CM

## 2017-04-17 DIAGNOSIS — M79676 Pain in unspecified toe(s): Secondary | ICD-10-CM

## 2017-04-17 DIAGNOSIS — M25371 Other instability, right ankle: Secondary | ICD-10-CM | POA: Diagnosis not present

## 2017-04-21 NOTE — Progress Notes (Unsigned)
Patient  came in to pick up Viola w/ dorsi assist [place].  The brace fit well and immediately patient's  gait approved.  The brace provided desired m-l stability in frontal/transverse planes and aided in dorsiflexion in saggital plane. Patient was able to don and doff brace with minimal difficulty.  Overall patient pleased with fit and functionality of brace. Codes to be billed are L1970 x 1, L2210 x 2, and L2820 x 1

## 2017-04-23 DIAGNOSIS — R269 Unspecified abnormalities of gait and mobility: Secondary | ICD-10-CM | POA: Diagnosis not present

## 2017-04-23 DIAGNOSIS — E119 Type 2 diabetes mellitus without complications: Secondary | ICD-10-CM | POA: Diagnosis not present

## 2017-04-23 DIAGNOSIS — M5416 Radiculopathy, lumbar region: Secondary | ICD-10-CM | POA: Diagnosis not present

## 2017-04-28 ENCOUNTER — Other Ambulatory Visit: Payer: PPO

## 2017-05-01 DIAGNOSIS — H25812 Combined forms of age-related cataract, left eye: Secondary | ICD-10-CM | POA: Diagnosis not present

## 2017-05-01 DIAGNOSIS — H4089 Other specified glaucoma: Secondary | ICD-10-CM | POA: Diagnosis not present

## 2017-05-06 LAB — HM DIABETES EYE EXAM

## 2017-05-12 DIAGNOSIS — N401 Enlarged prostate with lower urinary tract symptoms: Secondary | ICD-10-CM | POA: Diagnosis not present

## 2017-05-12 DIAGNOSIS — Z125 Encounter for screening for malignant neoplasm of prostate: Secondary | ICD-10-CM | POA: Diagnosis not present

## 2017-05-12 DIAGNOSIS — R3912 Poor urinary stream: Secondary | ICD-10-CM | POA: Diagnosis not present

## 2017-05-12 DIAGNOSIS — R3915 Urgency of urination: Secondary | ICD-10-CM | POA: Diagnosis not present

## 2017-05-21 ENCOUNTER — Other Ambulatory Visit: Payer: Self-pay | Admitting: Physician Assistant

## 2017-05-21 DIAGNOSIS — Z794 Long term (current) use of insulin: Secondary | ICD-10-CM

## 2017-05-21 DIAGNOSIS — E7800 Pure hypercholesterolemia, unspecified: Secondary | ICD-10-CM

## 2017-05-21 DIAGNOSIS — I25119 Atherosclerotic heart disease of native coronary artery with unspecified angina pectoris: Secondary | ICD-10-CM

## 2017-05-21 DIAGNOSIS — E118 Type 2 diabetes mellitus with unspecified complications: Secondary | ICD-10-CM

## 2017-05-24 DIAGNOSIS — E119 Type 2 diabetes mellitus without complications: Secondary | ICD-10-CM | POA: Diagnosis not present

## 2017-05-24 DIAGNOSIS — R269 Unspecified abnormalities of gait and mobility: Secondary | ICD-10-CM | POA: Diagnosis not present

## 2017-05-24 DIAGNOSIS — M5416 Radiculopathy, lumbar region: Secondary | ICD-10-CM | POA: Diagnosis not present

## 2017-05-31 ENCOUNTER — Other Ambulatory Visit: Payer: Self-pay | Admitting: Physician Assistant

## 2017-05-31 ENCOUNTER — Other Ambulatory Visit: Payer: Self-pay | Admitting: Cardiology

## 2017-05-31 DIAGNOSIS — Z794 Long term (current) use of insulin: Secondary | ICD-10-CM

## 2017-05-31 DIAGNOSIS — E7800 Pure hypercholesterolemia, unspecified: Secondary | ICD-10-CM

## 2017-05-31 DIAGNOSIS — E118 Type 2 diabetes mellitus with unspecified complications: Secondary | ICD-10-CM

## 2017-05-31 DIAGNOSIS — I25119 Atherosclerotic heart disease of native coronary artery with unspecified angina pectoris: Secondary | ICD-10-CM

## 2017-06-01 ENCOUNTER — Ambulatory Visit: Payer: Medicare Other

## 2017-06-09 NOTE — Progress Notes (Deleted)
Subjective:   [name] is a 66 y.o. male who presents for Medicare Annual/Subsequent preventive examination.  Review of Systems:  No ROS.  Medicare Wellness Visit. Additional risk factors are reflected in the social history.    Sleep patterns: {SX; SLEEP PATTERNS:18802::"feels rested on waking","does not get up to void","gets up *** times nightly to void","sleeps *** hours nightly"}.   Home Safety/Smoke Alarms: Feels safe in home. Smoke alarms in place.  Living environment; residence and Firearm Safety: {Rehab home environment / accessibility:30080::"no firearms","firearms stored safely"}. Seat Belt Safety/Bike Helmet: Wears seat belt.   Counseling:   Eye Exam-  Dental-  Male:   CCS-  Last 10/30/10, polyps, recall 5 years   PSA-  Lab Results  Component Value Date   PSA 1.99 04/09/2016   PSA 1.61 03/31/2012   PSA 1.12 05/26/2011       Objective:    Vitals: There were no vitals taken for this visit.  There is no height or weight on file to calculate BMI.  Tobacco History  Smoking Status  . Never Smoker  Smokeless Tobacco  . Never Used     Counseling given: Not Answered   Past Medical History:  Diagnosis Date  . Arthritis    "mild arthritis in hip"  . CAD (coronary artery disease)   . CHF (congestive heart failure) ([place])   . Complication of anesthesia    "lungs filled up with fluid"   . Diabetes mellitus    Type II  . Glaucoma   . Heart murmur   . HTN (hypertension)   . Hypercholesterolemia   . LBP (low back pain)   . Legally blind   . Peripheral neuropathy ([place])   . Sleep apnea   . Staph infection    "from back surgery"  . Trigger finger   . Ulcer ([place])   . Wears glasses    "to protect cornea" - legally blind   Past Surgical History:  Procedure Laterality Date  . CHOLECYSTECTOMY  09/15/2012   Procedure: LAPAROSCOPIC CHOLECYSTECTOMY WITH INTRAOPERATIVE CHOLANGIOGRAM;  Surgeon: Pedro Earls, MD;  Location: [facility];  Service: General;   Laterality: N/A;  . EYE SURGERY Bilateral    cataracts  . INGUINAL HERNIA REPAIR     right  . LUMBAR FUSION    . LUMBAR LAMINECTOMY/DECOMPRESSION MICRODISCECTOMY N/A 10/26/2015   Procedure: RIGHT AND CENTRAL LUMBAR LAMINECTOMY L3-4, RIGHT L5-S1 LATERAL RECESS DECOMPRESSION;  Surgeon: Jessy Oto, MD;  Location: [facility];  Service: Orthopedics;  Laterality: N/A;  . NECK SURGERY    . peptic ulcer dz surgery  pt was in his 20s   bleeding ulcer.   . Prosthetic Cornea placement, Right eye  2007   Landa Right 10/26/2015   Procedure: RELEASE TRIGGER FINGER RIGHT THUMB;  Surgeon: Jessy Oto, MD;  Location: [facility];  Service: Orthopedics;  Laterality: Right;   Family History  Problem Relation Age of Onset  . Breast cancer Mother   . Colon cancer Mother   . Hypertension Mother   . Diabetes Mother   . Hypertension Father   . Colon cancer Other        Elevated Risk for  . Diabetes Sister   . Diabetes Brother    History  Sexual Activity  . Sexual activity: Not Currently    Outpatient Encounter Prescriptions as of 06/11/2017  Medication Sig  . Blood Glucose Calibration (ONETOUCH VERIO) SOLN 1 Act by In Vitro route 2 (  two) times daily.  . Blood Glucose Monitoring Suppl (PRODIGY VOICE BLOOD GLUCOSE) W/DEVICE KIT Use to check blood sugars twice a day Dx e11.9  . carvedilol (COREG) 12.5 MG tablet TAKE 1 TABLET BY MOUTH TWICE A DAY WITH A MEAL  . cyanocobalamin 2000 MCG tablet Take 1 tablet (2,000 mcg total) by mouth daily.  Marland Kitchen ezetimibe (ZETIA) 10 MG tablet Take 1 tablet (10 mg total) by mouth daily.  Marland Kitchen gabapentin (NEURONTIN) 300 MG capsule Take 300 mg by mouth daily.  Marland Kitchen glucose blood (ONETOUCH VERIO) test strip Use BID to check BS. DX E11.9  . HUMALOG MIX 75/25 (75-25) 100 UNIT/ML SUSP injection INJECT 85 UNITS IN THE MORNING AND 45 UNITS IN [facility] EVENING  . insulin lispro protamine-lispro (HUMALOG 75/25 MIX) (75-25) 100 UNIT/ML SUSP injection Inject 45-65  Units into the skin 2 (two) times daily with a meal. Use 45 units every morning and use 65 units every evening  . isosorbide mononitrate (IMDUR) 120 MG 24 hr tablet TAKE 1 TABLET (120 MG TOTAL) BY MOUTH DAILY.  Marland Kitchen JANUMET 50-1000 MG tablet TAKE 1 TABLET BY MOUTH TWICE A DAY WITH A MEAL  . losartan (COZAAR) 50 MG tablet TAKE 1 TABLET BY MOUTH DAILY  . nitroGLYCERIN (NITROSTAT) 0.4 MG SL tablet Place 1 tablet (0.4 mg total) under the tongue every 5 (five) minutes as needed for chest pain.  Marland Kitchen omega-3 acid ethyl esters (LOVAZA) 1 g capsule Take 2 capsules (2 g total) by mouth 2 (two) times daily.  Marland Kitchen oxyCODONE-acetaminophen (PERCOCET/ROXICET) 5-325 MG tablet Take 2 tablets by mouth every 8 (eight) hours as needed for severe pain.  . pantoprazole (PROTONIX) 40 MG tablet Take 1 tablet (40 mg total) by mouth 2 (two) times daily.  . pravastatin (PRAVACHOL) 40 MG tablet TAKE 1 TABLET BY MOUTH DAILY  . PRODIGY LANCETS 28G MISC Use to check blood sugars twice a day Dx e11.9  . ranolazine (RANEXA) 500 MG 12 hr tablet Take 1 tablet (500 mg total) by mouth 2 (two) times daily.  . tamsulosin (FLOMAX) 0.4 MG CAPS capsule Take 1 capsule (0.4 mg total) by mouth daily.  . traMADol (ULTRAM) 50 MG tablet Take 1 tablet (50 mg total) by mouth every 8 (eight) hours as needed.   No facility-administered encounter medications on file as of 06/11/2017.     Activities of Daily Living In your present state of health, do you have any difficulty performing the following activities: 07/27/2016  Hearing? N  Vision? Y  Difficulty concentrating or making decisions? N  Walking or climbing stairs? Y  Dressing or bathing? N  Some recent data might be hidden    Patient Care Team: [name], MD as PCP - General   Assessment:    Physical assessment deferred to PCP.  Exercise Activities and Dietary recommendations   Diet (meal preparation, eat out, water intake, caffeinated beverages, dairy products, fruits and  vegetables): {Desc; diets:16563} Breakfast: Lunch:  Dinner:      Goals    None     Fall Risk Fall Risk  04/10/2016 04/09/2016 04/09/2016 03/06/2015 12/29/2013  Falls in the past year? _0    Depression Screen PHQ 2/9 Scores 04/10/2016 04/09/2016 04/09/2016 03/06/2015  PHQ - 2 Score 0 0 0 0    Cognitive Function        Immunization History  Administered Date(s) Administered  . Influenza Split 09/12/2012  . Influenza Whole 10/28/2010, 10/28/2011  . Influenza, High Dose Seasonal PF 08/25/2013, 08/21/2015,  12/09/2016  . Influenza,inj,Quad PF,36+ Mos 10/25/2014  . Pneumococcal Conjugate-13 10/25/2014  . Pneumococcal Polysaccharide-23 10/28/2010, 09/13/2012  . Pneumococcal-Unspecified 07/22/2014  . Td 10/22/2010   Screening Tests Health Maintenance  Topic Date Due  . Hepatitis C Screening  [birth year]  . OPHTHALMOLOGY EXAM  07/30/2016  . FOOT EXAM  04/09/2017  . HEMOGLOBIN A1C  06/09/2017  . INFLUENZA VACCINE  07/22/2017  . PNA vac Low Risk Adult (2 of 2 - PPSV23) 07/23/2019  . TETANUS/TDAP  10/22/2020  . COLONOSCOPY  10/30/2020      Plan:    I have personally reviewed and noted the following in the patient's chart:   . Medical and social history . Use of alcohol, tobacco or illicit drugs  . Current medications and supplements . Functional ability and status . Nutritional status . Physical activity . Advanced directives . List of other physicians . Vitals . Screenings to include cognitive, depression, and falls . Referrals and appointments  In addition, I have reviewed and discussed with patient certain preventive protocols, quality metrics, and best practice recommendations. A written personalized care plan for preventive services as well as general preventive health recommendations were provided to patient.     Michiel Cowboy, RN  06/09/2017

## 2017-06-09 NOTE — Progress Notes (Deleted)
Pre visit review using our clinic review tool, if applicable. No additional management support is needed unless otherwise documented below in the visit note. 

## 2017-06-11 ENCOUNTER — Ambulatory Visit: Payer: Medicaid Other

## 2017-06-15 ENCOUNTER — Telehealth: Payer: Self-pay | Admitting: Internal Medicine

## 2017-06-15 NOTE — Telephone Encounter (Signed)
There is a DPR on file allowing [place] to verbally disclose information to his wife, [name].

## 2017-06-15 NOTE — Telephone Encounter (Signed)
Called pt spouse and informed that I have not received the fax for pt diabetic shoes. Explained that this was the first that I have heard of it.  Spouse is contacting [facility] to have them fax to PCP direct fax number.

## 2017-06-15 NOTE — Telephone Encounter (Signed)
Pt called again today. She said that this needs to be handled asap. If it is not done today, the case will be dropped. She is very frustrated that this is taking so long and that she does not know if this is being handled or not. Please advise.

## 2017-06-15 NOTE — Telephone Encounter (Signed)
Can you check for DPR, I could not find.

## 2017-06-15 NOTE — Telephone Encounter (Signed)
Pt wife would like a call back regarding a fax for diabetic shoes.  She would like a call back as soon as possible

## 2017-06-15 NOTE — Telephone Encounter (Signed)
Patient's spouse called requesting call back as soon as possible.  Spouse states she has tried for eight weeks to get shoes for patient.  This is spouse first day calling our office in regard to this situation.   I did let spouse of patient know that normal call back is 24 to 48 hours and that calls are returned as soon as possible around patient care.  Spouse did get upset with this and wanted to speak with Almyra Free.  I did transfer spouse of patient to [employer].

## 2017-06-15 NOTE — Telephone Encounter (Signed)
Called [facility]. They will see if they can get the form to me before end of day. As of now the fax has not been sent.

## 2017-06-16 ENCOUNTER — Encounter: Payer: Self-pay | Admitting: Podiatry

## 2017-06-16 ENCOUNTER — Ambulatory Visit (INDEPENDENT_AMBULATORY_CARE_PROVIDER_SITE_OTHER): Payer: PPO | Admitting: Podiatry

## 2017-06-16 DIAGNOSIS — E1151 Type 2 diabetes mellitus with diabetic peripheral angiopathy without gangrene: Secondary | ICD-10-CM | POA: Diagnosis not present

## 2017-06-16 DIAGNOSIS — B351 Tinea unguium: Secondary | ICD-10-CM

## 2017-06-16 DIAGNOSIS — M79609 Pain in unspecified limb: Secondary | ICD-10-CM

## 2017-06-16 NOTE — Progress Notes (Signed)
Patient ID: [name], male   DOB: 1951-09-13, 66 y.o.   MRN: [id number]   Subjective: This patient presents for scheduled visit complaining of thickened and elongated toenails and a half walking wearing shoes and requests nail debridement. He is a known type II diabetic and denies any history of ulceration or amputation States he has a pending dropfoot ordered by his physician for right his right lower extremity Patient's wife present in the treatment room today   Objective:  Orientated 3  Vascular: Bilateral peripheral pitting edema DP pulses 0/4 bilaterally PT pulses 0/4 bilaterally Capillary reflex within normal limits  Neurological: Sensation to 10 g monofilament wire intact 2/5 right and 3/5 left Vibratory sensation nonreactive bilaterally Ankle reflexes weakly reactive bilaterally  Dermatological: No open skin lesions noted bilaterally Dry skin bilaterally The toenails are elongated, brittle, discolored, hypertrophic 6-10  Musculoskeletal: No deformities noted bilaterally There is no restriction ankle, subtalar, midtarsal joints bilaterally Dorsi flexion right 3/5 and 5/5 left Plantar flexion right 4/5 right 5/5 left   Assessment: Diabetic peripheral arterial disease Diabetic peripheral neuropathy Mycotic toenails 6-10 Mild dropfoot deformity right with pending AFO ordered by another physician per patient  Plan: Today I reviewed the results of examination today made patient aware that he had decreased circulation and feeling in his right and left feet.. I discussed generalized diabetic foot care and provided information and after visit summary  The toenails 6-10 were debrided mechanically and electrically without any bleeding  Reappoint 3 months

## 2017-06-16 NOTE — Telephone Encounter (Signed)
Form not received.

## 2017-06-16 NOTE — Patient Instructions (Signed)

## 2017-06-18 ENCOUNTER — Ambulatory Visit: Payer: PPO | Admitting: Orthotics

## 2017-06-23 DIAGNOSIS — M5416 Radiculopathy, lumbar region: Secondary | ICD-10-CM | POA: Diagnosis not present

## 2017-06-23 DIAGNOSIS — R269 Unspecified abnormalities of gait and mobility: Secondary | ICD-10-CM | POA: Diagnosis not present

## 2017-06-23 DIAGNOSIS — E119 Type 2 diabetes mellitus without complications: Secondary | ICD-10-CM | POA: Diagnosis not present

## 2017-06-23 NOTE — Telephone Encounter (Signed)
Have you received the form from [facility] for pts diabetic shoes?

## 2017-06-25 NOTE — Telephone Encounter (Signed)
I still have not received form.

## 2017-06-26 ENCOUNTER — Telehealth: Payer: Self-pay | Admitting: Cardiology

## 2017-06-26 NOTE — Telephone Encounter (Signed)
New message   Pt c/o of Chest Pain: STAT if CP now or developed within 24 hours  1. Are you having CP right now? No, yesterday  2. Are you experiencing any other symptoms (ex. SOB, nausea, vomiting, sweating)? no  3. How long have you been experiencing CP? Since yesterday  4. Is your CP continuous or coming and going? Coming and going  5. Have you taken Nitroglycerin? yes ?

## 2017-06-26 NOTE — Telephone Encounter (Signed)
Spoke with pt, he was traveling yesterday and noticed that with any exertion he developed a burning in his chest that would go away when he would rest and sit down. Today he developed this again this am after getting up and going to the bathroom. By the time he returned to bed he had the burning in the chest. He took a NTG, laid down and went to sleep. He continues to have chest discomfort and a "sweating" feeling down his arm to his hand. He reports SOB with exertion. He has had this type of issues with each time he required a stent. Advised patient to have someone take him to [facility] er for evaluation. Patient voiced understanding.cardmaster at the hospital aware.

## 2017-06-30 ENCOUNTER — Other Ambulatory Visit: Payer: Self-pay | Admitting: Cardiology

## 2017-06-30 DIAGNOSIS — Z794 Long term (current) use of insulin: Secondary | ICD-10-CM

## 2017-06-30 DIAGNOSIS — E118 Type 2 diabetes mellitus with unspecified complications: Secondary | ICD-10-CM

## 2017-06-30 DIAGNOSIS — I25119 Atherosclerotic heart disease of native coronary artery with unspecified angina pectoris: Secondary | ICD-10-CM

## 2017-06-30 DIAGNOSIS — E7800 Pure hypercholesterolemia, unspecified: Secondary | ICD-10-CM

## 2017-07-02 ENCOUNTER — Ambulatory Visit (INDEPENDENT_AMBULATORY_CARE_PROVIDER_SITE_OTHER): Payer: Self-pay | Admitting: Podiatry

## 2017-07-02 DIAGNOSIS — M2140 Flat foot [pes planus] (acquired), unspecified foot: Secondary | ICD-10-CM

## 2017-07-02 DIAGNOSIS — M21371 Foot drop, right foot: Secondary | ICD-10-CM | POA: Diagnosis not present

## 2017-07-02 DIAGNOSIS — M2011 Hallux valgus (acquired), right foot: Secondary | ICD-10-CM | POA: Diagnosis not present

## 2017-07-02 DIAGNOSIS — E1140 Type 2 diabetes mellitus with diabetic neuropathy, unspecified: Secondary | ICD-10-CM

## 2017-07-02 NOTE — Patient Instructions (Signed)

## 2017-07-02 NOTE — Progress Notes (Signed)
Patient ID: [name], male   DOB: [birth year], 66 y.o.   MRN: [id number]   Patient presents for diabetic shoe pick up, shoes are tried on for good fit.  There is some concern that the width required for the brace on the right foot is too much for the left, fillers were added to compensate.  Patient stated at the end of the visit that he felt the shoe was comfortable.  It was noticed by both my self and the patients wife that he seems to be dragging the left foot slightly now, we will watch this closely.  Brace for drop foot on right fits and seems to be helping.  Patient received 1 pair Apex [id number] Abulator Biomechanical black Velcro in men's size 11 wide and 3 pairs custom molded diabetic inserts.  Verbal and written break in and wear instructions given.  Patient will follow up for scheduled routine care.

## 2017-07-06 ENCOUNTER — Other Ambulatory Visit: Payer: Self-pay | Admitting: Internal Medicine

## 2017-07-06 DIAGNOSIS — I5022 Chronic systolic (congestive) heart failure: Secondary | ICD-10-CM

## 2017-07-09 ENCOUNTER — Observation Stay (HOSPITAL_COMMUNITY)
Admission: EM | Admit: 2017-07-09 | Discharge: 2017-07-10 | Disposition: A | Discharge status: Home / Self Care | Payer: PPO | Attending: Nephrology | Admitting: Nephrology

## 2017-07-09 ENCOUNTER — Other Ambulatory Visit (HOSPITAL_COMMUNITY): Payer: Self-pay

## 2017-07-09 ENCOUNTER — Other Ambulatory Visit: Payer: Self-pay

## 2017-07-09 ENCOUNTER — Emergency Department (HOSPITAL_COMMUNITY): Payer: PPO

## 2017-07-09 ENCOUNTER — Encounter (HOSPITAL_COMMUNITY): Payer: Self-pay | Admitting: Emergency Medicine

## 2017-07-09 DIAGNOSIS — E785 Hyperlipidemia, unspecified: Secondary | ICD-10-CM | POA: Diagnosis present

## 2017-07-09 DIAGNOSIS — E781 Pure hyperglyceridemia: Secondary | ICD-10-CM | POA: Insufficient documentation

## 2017-07-09 DIAGNOSIS — Z79899 Other long term (current) drug therapy: Secondary | ICD-10-CM | POA: Insufficient documentation

## 2017-07-09 DIAGNOSIS — I5032 Chronic diastolic (congestive) heart failure: Secondary | ICD-10-CM | POA: Diagnosis not present

## 2017-07-09 DIAGNOSIS — I2510 Atherosclerotic heart disease of native coronary artery without angina pectoris: Secondary | ICD-10-CM | POA: Diagnosis not present

## 2017-07-09 DIAGNOSIS — E669 Obesity, unspecified: Secondary | ICD-10-CM | POA: Diagnosis not present

## 2017-07-09 DIAGNOSIS — Z8249 Family history of ischemic heart disease and other diseases of the circulatory system: Secondary | ICD-10-CM | POA: Insufficient documentation

## 2017-07-09 DIAGNOSIS — R079 Chest pain, unspecified: Secondary | ICD-10-CM | POA: Diagnosis not present

## 2017-07-09 DIAGNOSIS — D519 Vitamin B12 deficiency anemia, unspecified: Secondary | ICD-10-CM | POA: Diagnosis not present

## 2017-07-09 DIAGNOSIS — Z6831 Body mass index (BMI) 31.0-31.9, adult: Secondary | ICD-10-CM | POA: Diagnosis not present

## 2017-07-09 DIAGNOSIS — I110 Hypertensive heart disease with heart failure: Secondary | ICD-10-CM | POA: Insufficient documentation

## 2017-07-09 DIAGNOSIS — H409 Unspecified glaucoma: Secondary | ICD-10-CM | POA: Diagnosis not present

## 2017-07-09 DIAGNOSIS — Z947 Corneal transplant status: Secondary | ICD-10-CM | POA: Insufficient documentation

## 2017-07-09 DIAGNOSIS — E118 Type 2 diabetes mellitus with unspecified complications: Secondary | ICD-10-CM | POA: Diagnosis present

## 2017-07-09 DIAGNOSIS — Z888 Allergy status to other drugs, medicaments and biological substances status: Secondary | ICD-10-CM | POA: Diagnosis not present

## 2017-07-09 DIAGNOSIS — E1140 Type 2 diabetes mellitus with diabetic neuropathy, unspecified: Secondary | ICD-10-CM | POA: Diagnosis not present

## 2017-07-09 DIAGNOSIS — E7800 Pure hypercholesterolemia, unspecified: Secondary | ICD-10-CM | POA: Insufficient documentation

## 2017-07-09 DIAGNOSIS — G894 Chronic pain syndrome: Secondary | ICD-10-CM | POA: Insufficient documentation

## 2017-07-09 DIAGNOSIS — Z981 Arthrodesis status: Secondary | ICD-10-CM | POA: Diagnosis not present

## 2017-07-09 DIAGNOSIS — Z9119 Patient's noncompliance with other medical treatment and regimen: Secondary | ICD-10-CM | POA: Diagnosis not present

## 2017-07-09 DIAGNOSIS — Z833 Family history of diabetes mellitus: Secondary | ICD-10-CM | POA: Insufficient documentation

## 2017-07-09 DIAGNOSIS — Z79891 Long term (current) use of opiate analgesic: Secondary | ICD-10-CM | POA: Insufficient documentation

## 2017-07-09 DIAGNOSIS — Z9889 Other specified postprocedural states: Secondary | ICD-10-CM | POA: Insufficient documentation

## 2017-07-09 DIAGNOSIS — Z794 Long term (current) use of insulin: Secondary | ICD-10-CM | POA: Insufficient documentation

## 2017-07-09 DIAGNOSIS — R0789 Other chest pain: Principal | ICD-10-CM | POA: Insufficient documentation

## 2017-07-09 DIAGNOSIS — N400 Enlarged prostate without lower urinary tract symptoms: Secondary | ICD-10-CM | POA: Diagnosis present

## 2017-07-09 DIAGNOSIS — I10 Essential (primary) hypertension: Secondary | ICD-10-CM | POA: Diagnosis present

## 2017-07-09 DIAGNOSIS — Z9842 Cataract extraction status, left eye: Secondary | ICD-10-CM | POA: Insufficient documentation

## 2017-07-09 DIAGNOSIS — Z9114 Patient's other noncompliance with medication regimen: Secondary | ICD-10-CM | POA: Insufficient documentation

## 2017-07-09 DIAGNOSIS — M961 Postlaminectomy syndrome, not elsewhere classified: Secondary | ICD-10-CM | POA: Insufficient documentation

## 2017-07-09 DIAGNOSIS — E1165 Type 2 diabetes mellitus with hyperglycemia: Secondary | ICD-10-CM

## 2017-07-09 DIAGNOSIS — Z800 Family history of malignant neoplasm of digestive organs: Secondary | ICD-10-CM | POA: Insufficient documentation

## 2017-07-09 DIAGNOSIS — Z9049 Acquired absence of other specified parts of digestive tract: Secondary | ICD-10-CM | POA: Insufficient documentation

## 2017-07-09 DIAGNOSIS — Z9841 Cataract extraction status, right eye: Secondary | ICD-10-CM | POA: Insufficient documentation

## 2017-07-09 DIAGNOSIS — K219 Gastro-esophageal reflux disease without esophagitis: Secondary | ICD-10-CM | POA: Diagnosis present

## 2017-07-09 DIAGNOSIS — G4733 Obstructive sleep apnea (adult) (pediatric): Secondary | ICD-10-CM | POA: Insufficient documentation

## 2017-07-09 DIAGNOSIS — Z955 Presence of coronary angioplasty implant and graft: Secondary | ICD-10-CM | POA: Insufficient documentation

## 2017-07-09 DIAGNOSIS — N529 Male erectile dysfunction, unspecified: Secondary | ICD-10-CM | POA: Insufficient documentation

## 2017-07-09 DIAGNOSIS — Z8052 Family history of malignant neoplasm of bladder: Secondary | ICD-10-CM | POA: Insufficient documentation

## 2017-07-09 DIAGNOSIS — IMO0002 Reserved for concepts with insufficient information to code with codable children: Secondary | ICD-10-CM

## 2017-07-09 DIAGNOSIS — I25119 Atherosclerotic heart disease of native coronary artery with unspecified angina pectoris: Secondary | ICD-10-CM | POA: Diagnosis present

## 2017-07-09 LAB — BASIC METABOLIC PANEL
ANION GAP: 7 (ref 5–15)
BUN: 10 mg/dL (ref 6–20)
CALCIUM: 9 mg/dL (ref 8.9–10.3)
CHLORIDE: 105 mmol/L (ref 101–111)
CO2: 25 mmol/L (ref 22–32)
CREATININE: 1.08 mg/dL (ref 0.61–1.24)
GFR calc non Af Amer: >60 mL/min (ref >60)
Glucose, Bld: 175 mg/dL — ABNORMAL HIGH (ref 65–99)
Potassium: 4.3 mmol/L (ref 3.5–5.1)
SODIUM: 137 mmol/L (ref 135–145)

## 2017-07-09 LAB — CBC
HCT: 36.7 % — ABNORMAL LOW (ref 39.0–52.0)
HEMATOCRIT: 36.6 % — AB (ref 39.0–52.0)
HEMOGLOBIN: 11.7 g/dL — AB (ref 13.0–17.0)
Hemoglobin: 12.1 g/dL — ABNORMAL LOW (ref 13.0–17.0)
MCH: 22.7 pg — ABNORMAL LOW (ref 26.0–34.0)
MCH: 23.5 pg — AB (ref 26.0–34.0)
MCHC: 31.9 g/dL (ref 30.0–36.0)
MCHC: 33.1 g/dL (ref 30.0–36.0)
MCV: 71.1 fL — AB (ref 78.0–100.0)
MCV: 71.1 fL — ABNORMAL LOW (ref 78.0–100.0)
PLATELETS: 195 10*3/uL (ref 150–400)
Platelets: 160 10*3/uL (ref 150–400)
RBC: 5.16 MIL/uL (ref 4.22–5.81)
RBC: 5.15 MIL/uL (ref 4.22–5.81)
RDW: 14.3 % (ref 11.5–15.5)
RDW: 14.9 % (ref 11.5–15.5)
WBC: 4.6 10*3/uL (ref 4.0–10.5)
WBC: 4.3 10*3/uL (ref 4.0–10.5)

## 2017-07-09 LAB — CREATININE, SERUM
Creatinine, Ser: 1.03 mg/dL (ref 0.61–1.24)
GFR calc Af Amer: >60 mL/min (ref >60)
GFR calc non Af Amer: >60 mL/min (ref >60)

## 2017-07-09 LAB — GLUCOSE, CAPILLARY: GLUCOSE-CAPILLARY: 109 mg/dL — AB (ref 65–99)

## 2017-07-09 LAB — TROPONIN I: Troponin I: <0.03 ng/mL (ref <0.03)

## 2017-07-09 LAB — I-STAT TROPONIN, ED: TROPONIN I, POC: 0 ng/mL (ref 0.00–0.08)

## 2017-07-09 LAB — BRAIN NATRIURETIC PEPTIDE: B NATRIURETIC PEPTIDE 5: 3.8 pg/mL (ref 0.0–100.0)

## 2017-07-09 LAB — D-DIMER, QUANTITATIVE (NOT AT ARMC)

## 2017-07-09 MED ORDER — TRAMADOL HCL 50 MG PO TABS
50.0000 mg | ORAL_TABLET | Freq: Three times a day (TID) | ORAL | Status: DC | PRN
  Administered 2017-07-10 (×2): 50 mg via ORAL
  Filled 2017-07-09 (×2): qty 1

## 2017-07-09 MED ORDER — ASPIRIN 325 MG PO TABS
325.0000 mg | ORAL_TABLET | Freq: Every day | ORAL | Status: DC
  Administered 2017-07-09: 325 mg via ORAL
  Filled 2017-07-09 (×2): qty 1

## 2017-07-09 MED ORDER — TAMSULOSIN HCL 0.4 MG PO CAPS
0.4000 mg | ORAL_CAPSULE | Freq: Every day | ORAL | Status: DC
  Administered 2017-07-10: 0.4 mg via ORAL
  Filled 2017-07-09 (×2): qty 1

## 2017-07-09 MED ORDER — PANTOPRAZOLE SODIUM 40 MG PO TBEC
40.0000 mg | DELAYED_RELEASE_TABLET | Freq: Two times a day (BID) | ORAL | Status: DC
  Administered 2017-07-10 (×2): 40 mg via ORAL
  Filled 2017-07-09 (×2): qty 1

## 2017-07-09 MED ORDER — ONDANSETRON HCL 4 MG/2ML IJ SOLN: 4.0000 mg | Freq: Four times a day (QID) | INTRAMUSCULAR | Status: DC | PRN

## 2017-07-09 MED ORDER — MORPHINE SULFATE (PF) 2 MG/ML IV SOLN: 1.0000 mg | INTRAVENOUS | Status: DC | PRN

## 2017-07-09 MED ORDER — NITROGLYCERIN 0.4 MG SL SUBL: 0.4000 mg | SUBLINGUAL_TABLET | SUBLINGUAL | Status: DC | PRN

## 2017-07-09 MED ORDER — ENOXAPARIN SODIUM 40 MG/0.4ML ~~LOC~~ SOLN
40.0000 mg | SUBCUTANEOUS | Status: DC
  Administered 2017-07-10: 40 mg via SUBCUTANEOUS
  Filled 2017-07-09: qty 0.4

## 2017-07-09 MED ORDER — INSULIN ASPART PROT & ASPART (70-30 MIX) 100 UNIT/ML ~~LOC~~ SUSP
35.0000 [IU] | Freq: Two times a day (BID) | SUBCUTANEOUS | Status: DC
  Administered 2017-07-10: 35 [IU] via SUBCUTANEOUS
  Filled 2017-07-09: qty 10

## 2017-07-09 MED ORDER — NITROGLYCERIN 0.4 MG SL SUBL
0.4000 mg | SUBLINGUAL_TABLET | SUBLINGUAL | Status: DC | PRN
  Administered 2017-07-09: 0.4 mg via SUBLINGUAL
  Filled 2017-07-09: qty 1

## 2017-07-09 MED ORDER — CARVEDILOL 12.5 MG PO TABS
12.5000 mg | ORAL_TABLET | Freq: Two times a day (BID) | ORAL | Status: DC
  Administered 2017-07-10 (×2): 12.5 mg via ORAL
  Filled 2017-07-09 (×2): qty 1

## 2017-07-09 MED ORDER — VITAMIN B-12 1000 MCG PO TABS
2000.0000 ug | ORAL_TABLET | Freq: Every day | ORAL | Status: DC
  Administered 2017-07-10: 2000 ug via ORAL
  Filled 2017-07-09 (×2): qty 2

## 2017-07-09 MED ORDER — ACETAMINOPHEN 325 MG PO TABS: 650.0000 mg | ORAL_TABLET | ORAL | Status: DC | PRN

## 2017-07-09 MED ORDER — EZETIMIBE 10 MG PO TABS
10.0000 mg | ORAL_TABLET | Freq: Every day | ORAL | Status: DC
  Administered 2017-07-10: 10 mg via ORAL
  Filled 2017-07-09 (×2): qty 1

## 2017-07-09 MED ORDER — ISOSORBIDE MONONITRATE ER 60 MG PO TB24
120.0000 mg | ORAL_TABLET | Freq: Every day | ORAL | Status: DC
  Administered 2017-07-10: 120 mg via ORAL
  Filled 2017-07-09 (×2): qty 2

## 2017-07-09 MED ORDER — PRAVASTATIN SODIUM 40 MG PO TABS
40.0000 mg | ORAL_TABLET | Freq: Every day | ORAL | Status: DC
  Administered 2017-07-10: 40 mg via ORAL
  Filled 2017-07-09: qty 1

## 2017-07-09 MED ORDER — ASPIRIN EC 325 MG PO TBEC: 325.0000 mg | DELAYED_RELEASE_TABLET | Freq: Every day | ORAL | Status: DC

## 2017-07-09 MED ORDER — GABAPENTIN 300 MG PO CAPS
300.0000 mg | ORAL_CAPSULE | Freq: Every day | ORAL | Status: DC
  Administered 2017-07-10: 300 mg via ORAL
  Filled 2017-07-09 (×2): qty 1

## 2017-07-09 MED ORDER — RANOLAZINE ER 500 MG PO TB12
500.0000 mg | ORAL_TABLET | Freq: Two times a day (BID) | ORAL | Status: DC
  Administered 2017-07-10: 500 mg via ORAL
  Filled 2017-07-09 (×2): qty 1

## 2017-07-09 MED ORDER — INSULIN ASPART 100 UNIT/ML ~~LOC~~ SOLN: 0.0000 [IU] | Freq: Three times a day (TID) | SUBCUTANEOUS | Status: DC

## 2017-07-09 MED ORDER — OXYCODONE-ACETAMINOPHEN 5-325 MG PO TABS
2.0000 | ORAL_TABLET | Freq: Three times a day (TID) | ORAL | Status: DC | PRN
  Administered 2017-07-10: 2 via ORAL
  Filled 2017-07-09: qty 2

## 2017-07-09 MED ORDER — INSULIN ASPART 100 UNIT/ML ~~LOC~~ SOLN: 0.0000 [IU] | Freq: Every day | SUBCUTANEOUS | Status: DC

## 2017-07-09 NOTE — H&P (Signed)
History and Physical        Hospital Admission Note Date: 07/09/2017  Patient name: [name] Medical record number: [id number] Date of birth: 12-30-1950 Age: 66 y.o. Gender: male  PCP: [name], MD    Patient coming from: home   I have reviewed all records in the [facility].    Chief Complaint:  Chest pain and shortness of breath for last 1 week  HPI: Patient is a 66 year old legally blind, history of diabetes, hypertension, hyperlipidemia, coronary artery disease with 2 prior cardiac stents, BPH presented to ED with exertional chest pain, shortness of breath for the last 1 week. Patient reports that his symptoms are similar to the prior episodes of chest pain when he had the cardiac stents placed. Patient reported that he has exertional chest pain, epigastric pain described as "burning sensation" over his chest, associated with shortness of breath and fatigue. The symptoms only comes on when he starts to walk and with exertion. The symptoms are improved on resting. At the time of presentation to the ER, chest pain was 7/10, sharp and burning, midsternal with no other radiation, improved some with aspirin and nitroglycerin sublingual. The patient also has noticed pain in his left arm as well. Denies any association of the chest pain with food or eating. Patient proves that he took nitroglycerin last week with improvement of his symptoms. Denies any fever, chills, nausea or vomiting or palpitations or diaphoresis.   ED work-up/course:  Temp 98.4, respiratory rate 16, pulse 72, BP 133/78 BMET unremarkable, troponin 0.0, CBC showed hemoglobin of 11.7, baseline around 12, MCV low 71, platelets 195 EKG showed no acute ST-T wave changes suggestive of ischemia  Review of Systems: Positives marked in 'bold' Constitutional: Denies fever, chills, diaphoresis, poor  appetite and + fatigue.  HEENT: Denies photophobia, eye pain, redness, hearing loss, ear pain, congestion, sore throat, rhinorrhea, sneezing, mouth sores, trouble swallowing, neck pain, neck stiffness and tinnitus.   Respiratory: No wheezing, fevers or chills or coughing Cardiovascular: Please see history of present illness Gastrointestinal: Denies nausea, vomiting, abdominal pain, diarrhea, constipation, blood in stool and abdominal distention.  Genitourinary: Denies dysuria, urgency, frequency, hematuria, flank pain and difficulty urinating.  Musculoskeletal: Denies myalgias, back pain, joint swelling, arthralgias and gait problem.  Skin: Denies pallor, rash and wound.  Neurological: Denies dizziness, seizures, syncope, weakness, light-headedness, numbness and headaches.  Hematological: Denies adenopathy. Easy bruising, personal or family bleeding history  Psychiatric/Behavioral: Denies suicidal ideation, mood changes, confusion, nervousness, sleep disturbance and agitation  Past Medical History: Past Medical History:  Diagnosis Date  . Arthritis    "mild arthritis in hip"  . CAD (coronary artery disease)   . CHF (congestive heart failure) ([place])   . Complication of anesthesia    "lungs filled up with fluid"   . Diabetes mellitus    Type II  . Glaucoma   . Heart murmur   . HTN (hypertension)   . Hypercholesterolemia   . LBP (low back pain)   . Legally blind   . Peripheral neuropathy   . Sleep apnea   . Staph infection    "from back surgery"  . Trigger finger   .  Ulcer   . Wears glasses    "to protect cornea" - legally blind    Past Surgical History:  Procedure Laterality Date  . CHOLECYSTECTOMY  09/15/2012   Procedure: LAPAROSCOPIC CHOLECYSTECTOMY WITH INTRAOPERATIVE CHOLANGIOGRAM;  Surgeon: Pedro Earls, MD;  Location: [place];  Service: General;  Laterality: N/A;  . EYE SURGERY Bilateral    cataracts  . INGUINAL HERNIA REPAIR     right  . LUMBAR FUSION    . LUMBAR  LAMINECTOMY/DECOMPRESSION MICRODISCECTOMY N/A 10/26/2015   Procedure: RIGHT AND CENTRAL LUMBAR LAMINECTOMY L3-4, RIGHT L5-S1 LATERAL RECESS DECOMPRESSION;  Surgeon: Jessy Oto, MD;  Location: [place];  Service: Orthopedics;  Laterality: N/A;  . NECK SURGERY    . peptic ulcer dz surgery  pt was in his 20s   bleeding ulcer.   . Prosthetic Cornea placement, Right eye  2007   Pump Back Right 10/26/2015   Procedure: RELEASE TRIGGER FINGER RIGHT THUMB;  Surgeon: Jessy Oto, MD;  Location: [place];  Service: Orthopedics;  Laterality: Right;    Medications: Prior to Admission medications   Medication Sig Start Date End Date Taking? Authorizing Provider  Blood Glucose Calibration (ONETOUCH VERIO) SOLN 1 Act by In Vitro route 2 (two) times daily. 08/22/15   [name], MD  Blood Glucose Monitoring Suppl (PRODIGY VOICE BLOOD GLUCOSE) W/DEVICE KIT Use to check blood sugars twice a day Dx e11.9 10/30/15   Eulogio Bear U, DO  carvedilol (COREG) 12.5 MG tablet TAKE 1 TABLET BY MOUTH TWICE A DAY WITH A MEAL 07/23/16   [name], MD  cyanocobalamin 2000 MCG tablet Take 1 tablet (2,000 mcg total) by mouth daily. 12/09/16   [name], MD  ezetimibe (ZETIA) 10 MG tablet Take 1 tablet (10 mg total) by mouth daily. 08/05/16   [name], MD  gabapentin (NEURONTIN) 300 MG capsule Take 300 mg by mouth daily.    [provider]  glucose blood (ONETOUCH VERIO) test strip Use BID to check BS. DX E11.9 08/05/16   [name], MD  HUMALOG MIX 75/25 (75-25) 100 UNIT/ML SUSP injection INJECT 85 UNITS IN THE MORNING AND 45 UNITS IN [facility] EVENING 08/20/16   [name], MD  insulin lispro protamine-lispro (HUMALOG 75/25 MIX) (75-25) 100 UNIT/ML SUSP injection Inject 45-65 Units into the skin 2 (two) times daily with a meal. Use 45 units every morning and use 65 units every evening 08/05/16   [name], MD  isosorbide mononitrate (IMDUR) 120 MG 24 hr tablet  TAKE 1 TABLET (120 MG TOTAL) BY MOUTH DAILY. 06/02/17   Kathlen Mody, Scott T, PA-C  JANUMET 50-1000 MG tablet TAKE 1 TABLET BY MOUTH TWICE A DAY WITH A MEAL 03/31/17   [name], MD  losartan (COZAAR) 50 MG tablet TAKE 1 TABLET BY MOUTH DAILY 04/09/17   Richardson Dopp T, PA-C  nitroGLYCERIN (NITROSTAT) 0.4 MG SL tablet Place 1 tablet (0.4 mg total) under the tongue every 5 (five) minutes as needed for chest pain. 08/14/16 11/12/16  Eileen Stanford, PA-C  omega-3 acid ethyl esters (LOVAZA) 1 g capsule Take 2 capsules (2 g total) by mouth 2 (two) times daily. 03/31/17   [name], MD  oxyCODONE-acetaminophen (PERCOCET/ROXICET) 5-325 MG tablet Take 2 tablets by mouth every 8 (eight) hours as needed for severe pain. 11/11/16   [name], MD  pantoprazole (PROTONIX) 40 MG tablet Take 1 tablet (40 mg total) by  mouth 2 (two) times daily. 08/14/16   Eileen Stanford, PA-C  pravastatin (PRAVACHOL) 40 MG tablet TAKE 1 TABLET BY MOUTH EVERY DAY 07/01/17   Jerline Pain, MD  PRODIGY LANCETS 28G MISC Use to check blood sugars twice a day Dx e11.9 08/05/16   [name], MD  ranolazine (RANEXA) 500 MG 12 hr tablet Take 1 tablet (500 mg total) by mouth 2 (two) times daily. 08/14/16   Eileen Stanford, PA-C  tamsulosin (FLOMAX) 0.4 MG CAPS capsule Take 1 capsule (0.4 mg total) by mouth daily. 10/30/15   Jessy Oto, MD  traMADol (ULTRAM) 50 MG tablet Take 1 tablet (50 mg total) by mouth every 8 (eight) hours as needed. 01/29/17   Jessy Oto, MD    Allergies:   Allergies  Allergen Reactions  . Furosemide     pancreatitis  . Diamox [Acetazolamide] Itching  . Lisinopril Rash    Social History:  reports that he has never smoked. He has never used smokeless tobacco. He reports that he does not drink alcohol or use drugs.  Family History: Family History  Problem Relation Age of Onset  . Breast cancer Mother   . Colon cancer Mother   . Hypertension Mother   . Diabetes Mother   .  Hypertension Father   . Colon cancer Other        Elevated Risk for  . Diabetes Sister   . Diabetes Brother     Physical Exam: Blood pressure (!) 154/78, pulse 61, temperature 98.4 F (36.9 C), temperature source Oral, resp. rate 15, SpO2 97 %. General: Alert, awake, oriented x3, in no acute distress, Legally blind. Eyes: Legally blind HEENT: normocephalic, atraumatic, oropharynx clear Neck: supple, no masses or lymphadenopathy, no goiter, no bruits, no JVD CVS: Regular rate and rhythm, without murmurs, rubs or gallops. No lower extremity edema, no chest wall tenderness Resp : Clear to auscultation bilaterally, no wheezing, rales or rhonchi. GI : Soft, nontender, nondistended, positive bowel sounds, no masses. No hepatomegaly. No hernia.  Musculoskeletal: No clubbing or cyanosis, positive pedal pulses. No contracture. ROM intact  Neuro: Grossly intact, no focal neurological deficits, strength 5/5 upper and lower extremities bilaterally Psych: alert and oriented x 3, normal mood and affect Skin: no rashes or lesions, warm and dry   LABS on Admission: I have personally reviewed all the labs and imagings below    Basic Metabolic Panel:  Recent Labs Lab 07/09/17 1609  NA 137  K 4.3  CL 105  CO2 25  GLUCOSE 175*  BUN 10  CREATININE 1.08  CALCIUM 9.0   Liver Function Tests: No results for input(s): AST, ALT, ALKPHOS, BILITOT, PROT, ALBUMIN in the last 168 hours. No results for input(s): LIPASE, AMYLASE in the last 168 hours. No results for input(s): AMMONIA in the last 168 hours. CBC:  Recent Labs Lab 07/09/17 1609  WBC 4.6  HGB 11.7*  HCT 36.7*  MCV 71.1*  PLT 195   Cardiac Enzymes: No results for input(s): CKTOTAL, CKMB, CKMBINDEX, TROPONINI in the last 168 hours. BNP: Invalid input(s): POCBNP CBG: No results for input(s): GLUCAP in the last 168 hours.  Radiological Exams on Admission:  Dg Chest 2 View  Result Date: 07/09/2017 CLINICAL DATA:  Chest pain  EXAM: CHEST  2 VIEW COMPARISON:  07/26/2016 FINDINGS: The heart size and mediastinal contours are within normal limits. Both lungs are clear. The visualized skeletal structures are unremarkable. IMPRESSION: No active cardiopulmonary disease. Electronically Signed  By: Inez Catalina M.D.   On: 07/09/2017 16:37      EKG: Independently reviewed. Rate 73, no acute ST-T wave changes suggestive of ischemia   Assessment/Plan Principal Problem:   Chest pain with typical and atypical features, angina with underlying history of CAD, risk factors of hypertension, hyperlipidemia, diabetes mellitus - Admit to telemetry, obs, obtain serial cardiac enzymes, first set of troponins negative - Obtain d-dimer, lipid panel - Continue aspirin, beta blocker, statin. Per patient currently chest pain improved when not moving - Cardiology consulted, sent message through inbox, NPO after midnight  Active Problems:   HYPERTENSION, BENIGN ESSENTIAL - Currently stable, continue Coreg,    BPH (benign prostatic hyperplasia) -No acute issues, Continue tamsulosin    Hyperlipidemia LDL goal <70 -  obtain lipid panel, continue pravastatin    GERD (gastroesophageal reflux disease) - Continue PPI    Type 2 diabetes mellitus with complication (HCC) - Obtain hemoglobin A1c, placed on sliding scale insulin and home regimen Humalog 75/25 35units BID     Chronic diastolic heart failure ([place]) - 2-D echo 8/17 showed EF of 60-65% with grade 1 diastolic dysfunction, currently stable, euvolemic  DVT prophylaxis: Lovenox  CODE STATUS: ful code  Consults called: Cardiology, sent message through inbox  Family Communication: Admission, patients condition and plan of care including tests being ordered have been discussed with the patient and wife who indicates understanding and agree with the plan and Code Status  Admission status:   Disposition plan: Further plan will depend as patient's clinical course evolves and  further radiologic and laboratory data become available.    At the time of admission, it appears that the appropriate admission status for this patient is observation . This is judged to be reasonable and necessary in order to provide the required intensity of service to ensure the patient's safety given the presenting symptoms of chest pain with exertion, high risk factors possible angina needing further workup including stress test versus cardiac, physical exam findings, and initial radiographic and laboratory data in the context of their chronic comorbidities.  The medical decision making on this patient was of high complexity and the patient is at high risk for clinical deterioration, therefore this is a level 3 visit.   Time Spent on Admission: 31mns       M.D. [facility] 07/09/2017, 6:26 PM Pager: [phone] If 7PM-7AM, please contact night-coverage [email or web address] Password [email or web address]

## 2017-07-09 NOTE — Progress Notes (Signed)
New Admission Note:   Arrival Method: ED bed with ED nurse Mental Orientation: A&O x4, patient blind Telemetry: initiated Skin: scars on abdomen and lower extremities Pain: denies Safety Measures: yellow socks, yellow armband, bed alarm.    Orders to be reviewed and implemented. Will continue to monitor the patient. Call light has been placed within reach and bed alarm has been activated.   Riki Altes, RN Phone: [phone]

## 2017-07-09 NOTE — ED Provider Notes (Signed)
I saw and evaluated the patient, reviewed the resident's note and I agree with the findings and plan.   EKG Interpretation None      66 year old male who presents with chest pain. He has a history of diabetes, hypertension, hyperlipidemia and coronary artery disease with 2 prior cardiac stents. States that since last Friday he has had exertional chest pain described as a burning sensation over his chest as if his on fire. Associated with shortness of breath and fatigue. Symptoms typically come on when walking long distances, but gradually worsening to now walking, minimally in his home. Improves with rest. No associated nausea, vomiting, diaphoresis, abdominal pain. Is not associated with food or eating. Taken nitroglycerin last Friday with improvement of symptoms. He states that symptoms were similar to when he had stent placement in the past.  His EKG is not acutely ischemic. His troponin 1 is normal. His chest x-ray is visualized and shows no acute cardiopulmonary processes. Symptoms are concerning for ACS etiology as it is consistent with his previous angina. Plan for admission for ACS rule out.  Records reviewed. He was admitted one year ago for similar symptoms. He had low risk stress testing and with echo showing normal systolic function, moderate LVH and grade 1 diastolic dysfunction.     Forde Dandy, MD 07/09/17 [phone]

## 2017-07-09 NOTE — ED Provider Notes (Signed)
[facility] DEPT Provider Note   CSN: [id number] Arrival date & time: 07/09/17  1531     History   Chief Complaint Chief Complaint  Patient presents with  . Chest Pain    HPI [name] is a 66 y.o. male history of type 2 diabetes, CHF, coronary artery disease with 2 stents is coming in today with chest pain shortness of breath. Patient states is going on the last 2 weeks and has been worsening. States he gets the burning chest pain in the substernal and left-sided region whenever he walks or exerts himself. States when he stops he feels better. Has not tried anything for the pain. Denies history of GERD and states it'll happen even after he has not eaten. Endorses some diaphoresis during these episodes but no nausea or vomiting or altered mental status. Patient states he took a nitroglycerin on Friday which helped his pain. No abdominal pain or urinary symptoms.  HPI  Past Medical History:  Diagnosis Date  . Arthritis    "mild arthritis in hip"  . CAD (coronary artery disease)   . CHF (congestive heart failure) ([place])   . Complication of anesthesia    "lungs filled up with fluid"   . Diabetes mellitus    Type II  . Glaucoma   . Heart murmur   . HTN (hypertension)   . Hypercholesterolemia   . LBP (low back pain)   . Legally blind   . Peripheral neuropathy   . Sleep apnea   . Staph infection    "from back surgery"  . Trigger finger   . Ulcer   . Wears glasses    "to protect cornea" - legally blind    Patient Active Problem List   Diagnosis Date Noted  . Chest pain 07/09/2017  . Chronic diastolic heart failure ([place]) 11/18/2016  . Neuropathy 08/05/2016  . Type 2 diabetes mellitus with complication ([place])   . Spinal stenosis, lumbar region, with neurogenic claudication 10/26/2015    Class: Chronic  . Neurogenic claudication due to lumbar spinal stenosis 10/26/2015  . Sleep apnea 10/24/2015  . Lumbar radiculopathy 03/21/2015  . Non-compliant behavior 03/06/2015   . Diabetic neuropathy ([place]) 02/13/2014  . Postlaminectomy syndrome, lumbar region 02/13/2014  . Claudication in peripheral vascular disease ([place]) 12/28/2013  . Chronic pain syndrome 12/28/2013  . GERD (gastroesophageal reflux disease) 08/25/2013  . Pure hyperglyceridemia 08/25/2013  . Neurogenic bladder 10/01/2012  . Hyperlipidemia LDL goal <70 04/02/2012  . Routine general medical examination at a health care facility 04/02/2012  . B12 deficiency anemia 03/31/2012  . BPH (benign prostatic hyperplasia) 03/31/2012  . Obesity 05/26/2011  . ED (erectile dysfunction) 05/26/2011  . HYPERTENSION, BENIGN ESSENTIAL 12/18/2010  . Coronary atherosclerosis 08/28/2010  . Congestive heart failure ([place]) 08/28/2010    Past Surgical History:  Procedure Laterality Date  . CHOLECYSTECTOMY  09/15/2012   Procedure: LAPAROSCOPIC CHOLECYSTECTOMY WITH INTRAOPERATIVE CHOLANGIOGRAM;  Surgeon: Pedro Earls, MD;  Location: [facility];  Service: General;  Laterality: N/A;  . EYE SURGERY Bilateral    cataracts  . INGUINAL HERNIA REPAIR     right  . LUMBAR FUSION    . LUMBAR LAMINECTOMY/DECOMPRESSION MICRODISCECTOMY N/A 10/26/2015   Procedure: RIGHT AND CENTRAL LUMBAR LAMINECTOMY L3-4, RIGHT L5-S1 LATERAL RECESS DECOMPRESSION;  Surgeon: Jessy Oto, MD;  Location: [facility];  Service: Orthopedics;  Laterality: N/A;  . NECK SURGERY    . peptic ulcer dz surgery  pt was in his 20s   bleeding ulcer.   [facility] Kitchen  Prosthetic Cornea placement, Right eye  2007   [place] Right 10/26/2015   Procedure: RELEASE TRIGGER FINGER RIGHT THUMB;  Surgeon: Jessy Oto, MD;  Location: [facility];  Service: Orthopedics;  Laterality: Right;       Home Medications    Prior to Admission medications   Medication Sig Start Date End Date Taking? Authorizing Provider  carvedilol (COREG) 12.5 MG tablet TAKE 1 TABLET BY MOUTH TWICE A DAY WITH A MEAL Patient taking differently: TAKE 12.5MG BY MOUTH TWICE A DAY WITH  A MEAL 07/23/16  Yes [name], MD  cyanocobalamin 2000 MCG tablet Take 1 tablet (2,000 mcg total) by mouth daily. 12/09/16  Yes [name], MD  ezetimibe (ZETIA) 10 MG tablet Take 1 tablet (10 mg total) by mouth daily. 08/05/16  Yes [name], MD  gabapentin (NEURONTIN) 300 MG capsule Take 300 mg by mouth daily.   Yes [provider]  HUMALOG MIX 75/25 (75-25) 100 UNIT/ML SUSP injection INJECT 85 UNITS IN THE MORNING AND 45 UNITS IN [facility] EVENING 08/20/16  Yes [name], MD  isosorbide mononitrate (IMDUR) 120 MG 24 hr tablet TAKE 1 TABLET (120 MG TOTAL) BY MOUTH DAILY. 06/02/17  Yes Weaver, Scott T, PA-C  JANUMET 50-1000 MG tablet TAKE 1 TABLET BY MOUTH TWICE A DAY WITH A MEAL 03/31/17  Yes [name], MD  losartan (COZAAR) 50 MG tablet TAKE 1 TABLET BY MOUTH DAILY Patient taking differently: TAKE 50MG BY MOUTH DAILY 04/09/17  Yes Weaver, Scott T, PA-C  nitroGLYCERIN (NITROSTAT) 0.4 MG SL tablet Place 1 tablet (0.4 mg total) under the tongue every 5 (five) minutes as needed for chest pain. 08/14/16 07/09/17 Yes Eileen Stanford, PA-C  omega-3 acid ethyl esters (LOVAZA) 1 g capsule Take 2 capsules (2 g total) by mouth 2 (two) times daily. 03/31/17  Yes [name], MD  pantoprazole (PROTONIX) 40 MG tablet Take 1 tablet (40 mg total) by mouth 2 (two) times daily. 08/14/16  Yes Eileen Stanford, PA-C  pravastatin (PRAVACHOL) 40 MG tablet TAKE 1 TABLET BY MOUTH EVERY DAY Patient taking differently: TAKE 40MG BY MOUTH EVERY DAY 07/01/17  Yes Jerline Pain, MD  ranolazine (RANEXA) 500 MG 12 hr tablet Take 1 tablet (500 mg total) by mouth 2 (two) times daily. 08/14/16  Yes Eileen Stanford, PA-C  tamsulosin (FLOMAX) 0.4 MG CAPS capsule Take 1 capsule (0.4 mg total) by mouth daily. 10/30/15  Yes Jessy Oto, MD  traMADol (ULTRAM) 50 MG tablet Take 1 tablet (50 mg total) by mouth every 8 (eight) hours as needed. 01/29/17  Yes Jessy Oto, MD  Blood Glucose Calibration  (ONETOUCH VERIO) SOLN 1 Act by In Vitro route 2 (two) times daily. 08/22/15   [name], MD  Blood Glucose Monitoring Suppl (PRODIGY VOICE BLOOD GLUCOSE) W/DEVICE KIT Use to check blood sugars twice a day Dx e11.9 10/30/15   Eulogio Bear U, DO  glucose blood (ONETOUCH VERIO) test strip Use BID to check BS. DX E11.9 08/05/16   [name], MD  insulin lispro protamine-lispro (HUMALOG 75/25 MIX) (75-25) 100 UNIT/ML SUSP injection Inject 45-65 Units into the skin 2 (two) times daily with a meal. Use 45 units every morning and use 65 units every evening 08/05/16   [name], MD  oxyCODONE-acetaminophen (PERCOCET/ROXICET) 5-325 MG tablet Take 2 tablets by mouth every 8 (eight) hours as needed for severe pain. Patient not  taking: Reported on 07/09/2017 11/11/16   [name], MD  PRODIGY LANCETS 28G MISC Use to check blood sugars twice a day Dx e11.9 08/05/16   [name], MD    Family History Family History  Problem Relation Age of Onset  . Breast cancer Mother   . Colon cancer Mother   . Hypertension Mother   . Diabetes Mother   . Hypertension Father   . Colon cancer Other        Elevated Risk for  . Diabetes Sister   . Diabetes Brother     Social History Social History  Substance Use Topics  . Smoking status: Never Smoker  . Smokeless tobacco: Never Used  . Alcohol use No     Allergies   Furosemide; Diamox [acetazolamide]; and Lisinopril   Review of Systems Review of Systems  Constitutional: Positive for diaphoresis. Negative for chills and fever.  HENT: Negative for ear pain and sore throat.   Eyes: Negative for pain and visual disturbance.  Respiratory: Positive for shortness of breath. Negative for cough, chest tightness, wheezing and stridor.   Cardiovascular: Positive for chest pain. Negative for palpitations.  Gastrointestinal: Negative for abdominal pain, blood in stool, constipation, diarrhea, nausea and vomiting.  Endocrine: Negative for  polyuria.  Genitourinary: Negative for dysuria and hematuria.  Musculoskeletal: Negative for arthralgias and back pain.  Skin: Negative for color change and rash.  Neurological: Negative for seizures, syncope and headaches.  Psychiatric/Behavioral: Negative for agitation and behavioral problems.  All other systems reviewed and are negative.    Physical Exam Updated Vital Signs BP (!) 148/66 (BP Location: Left Arm)   Pulse (!) 107   Temp 97.9 F (36.6 C) (Oral)   Resp 18   Ht 5' 11"  (1.803 m)   Wt 101.7 kg (224 lb 4.8 oz)   SpO2 99%   BMI 31.28 kg/m   Physical Exam  Constitutional: He is oriented to person, place, and time. He appears well-developed and well-nourished. No distress.  HENT:  Head: Normocephalic and atraumatic.  Eyes: No scleral icterus.  Neck: Normal range of motion. No tracheal deviation present.  Cardiovascular: Normal rate and intact distal pulses.   Pulmonary/Chest: Effort normal and breath sounds normal. No respiratory distress. He has no wheezes. He has no rales.  Abdominal: Soft. He exhibits no distension. There is no tenderness. There is no rebound and no guarding.  Musculoskeletal: He exhibits no tenderness or deformity.  Neurological: He is alert and oriented to person, place, and time.  Skin: Skin is warm and dry. He is not diaphoretic.  Psychiatric: He has a normal mood and affect.     ED Treatments / Results  Labs (all labs ordered are listed, but only abnormal results are displayed) Labs Reviewed  BASIC METABOLIC PANEL - Abnormal; Notable for the following:       Result Value   Glucose, Bld 175 (*)    All other components within normal limits  CBC - Abnormal; Notable for the following:    Hemoglobin 11.7 (*)    HCT 36.7 (*)    MCV 71.1 (*)    MCH 22.7 (*)    All other components within normal limits  CBC - Abnormal; Notable for the following:    Hemoglobin 12.1 (*)    HCT 36.6 (*)    MCV 71.1 (*)    MCH 23.5 (*)    All other  components within normal limits  GLUCOSE, CAPILLARY - Abnormal; Notable for the following:  Glucose-Capillary 109 (*)    All other components within normal limits  BRAIN NATRIURETIC PEPTIDE  D-DIMER, QUANTITATIVE (NOT AT [facility])  CREATININE, SERUM  TROPONIN I  HEMOGLOBIN A1C  LIPID PANEL  TROPONIN I  TROPONIN I  I-STAT TROPONIN, ED    EKG  EKG Interpretation  Date/Time:  Thursday July 09 2017 15:35:03 EDT Ventricular Rate:  73 PR Interval:  212 QRS Duration: 78 QT Interval:  378 QTC Calculation: 416 R Axis:   -33 Text Interpretation:  Sinus rhythm with 1st degree A-V block Left axis deviation Low voltage QRS Possible Anterolateral infarct , age undetermined Abnormal ECG No acute changes Confirmed by Brantley Stage [phone]) on 07/09/2017 6:03:50 PM       Radiology Dg Chest 2 View  Result Date: 07/09/2017 CLINICAL DATA:  Chest pain EXAM: CHEST  2 VIEW COMPARISON:  07/26/2016 FINDINGS: The heart size and mediastinal contours are within normal limits. Both lungs are clear. The visualized skeletal structures are unremarkable. IMPRESSION: No active cardiopulmonary disease. Electronically Signed   By: Inez Catalina M.D.   On: 07/09/2017 16:37    Procedures Procedures (including critical care time)  Medications Ordered in ED Medications  aspirin tablet 325 mg (325 mg Oral Given 07/09/17 1810)  acetaminophen (TYLENOL) tablet 650 mg (not administered)  ondansetron (ZOFRAN) injection 4 mg (not administered)  enoxaparin (LOVENOX) injection 40 mg (not administered)  morphine 2 MG/ML injection 1-2 mg (not administered)  pravastatin (PRAVACHOL) tablet 40 mg (not administered)  isosorbide mononitrate (IMDUR) 24 hr tablet 120 mg (not administered)  traMADol (ULTRAM) tablet 50 mg (not administered)  oxyCODONE-acetaminophen (PERCOCET/ROXICET) 5-325 MG per tablet 2 tablet (not administered)  vitamin B-12 (CYANOCOBALAMIN) tablet 2,000 mcg (not administered)  gabapentin (NEURONTIN) capsule 300 mg  (not administered)  insulin aspart protamine- aspart (NOVOLOG MIX 70/30) injection 35 Units (not administered)  pantoprazole (PROTONIX) EC tablet 40 mg (not administered)  ranolazine (RANEXA) 12 hr tablet 500 mg (not administered)  ezetimibe (ZETIA) tablet 10 mg (not administered)  carvedilol (COREG) tablet 12.5 mg (not administered)  tamsulosin (FLOMAX) capsule 0.4 mg (not administered)  nitroGLYCERIN (NITROSTAT) SL tablet 0.4 mg (not administered)  insulin aspart (novoLOG) injection 0-5 Units (not administered)  insulin aspart (novoLOG) injection 0-9 Units (not administered)     Initial Impression / Assessment and Plan / ED Course  I have reviewed the triage vital signs and the nursing notes.  Pertinent labs & imaging results that were available during my care of the patient were reviewed by me and considered in my medical decision making (see chart for details).     Patient coming in with 2 weeks of exertional chest pain described as burning. History of coronary artery disease and nitroglycerin helped with his pain. Currently an 4 out of 10 pain right now, will give nitroglycerin as well as aspirin. Initial labwork reassuring and x-ray unremarkable however considering coronary artery disease history, he is high risk HEAR score and will need admission for further evaluation and management. EKG shows sinus rhythm with first-degree AV block. No ST segment elevation or other signs of acute ischemia, and the EKG is similar to previous. He'll be admitted to the hospitalist for further evaluation and management, and was stable while under my care.  Patient was seen with my attending, Dr. Oleta Mouse, who voiced agreement and oversaw the evaluation and treatment of this patient.   [employer] seismologist was used in the creation of this note. If there are any errors or inconsistencies needing clarification, please contact  me directly.   Final Clinical Impressions(s) / ED Diagnoses   Final  diagnoses:  Chest pain, unspecified type    New Prescriptions Current Discharge Medication List       Valda Lamb, MD 07/09/17 2341    Forde Dandy, MD 07/10/17 1430

## 2017-07-09 NOTE — ED Triage Notes (Signed)
Pt presents to ED for assessment of epigastric pain x 1 week, intermittent, typically worsening with exertion.  Pt c/o SOB, diarrhea, left arm tingling and sporadic contraction of his left hand.

## 2017-07-10 ENCOUNTER — Observation Stay (HOSPITAL_BASED_OUTPATIENT_CLINIC_OR_DEPARTMENT_OTHER): Payer: PPO

## 2017-07-10 DIAGNOSIS — I5032 Chronic diastolic (congestive) heart failure: Secondary | ICD-10-CM | POA: Diagnosis not present

## 2017-07-10 DIAGNOSIS — R079 Chest pain, unspecified: Secondary | ICD-10-CM

## 2017-07-10 DIAGNOSIS — E785 Hyperlipidemia, unspecified: Secondary | ICD-10-CM | POA: Diagnosis not present

## 2017-07-10 DIAGNOSIS — I10 Essential (primary) hypertension: Secondary | ICD-10-CM

## 2017-07-10 DIAGNOSIS — E118 Type 2 diabetes mellitus with unspecified complications: Secondary | ICD-10-CM | POA: Diagnosis not present

## 2017-07-10 LAB — GLUCOSE, CAPILLARY
Glucose-Capillary: 140 mg/dL — ABNORMAL HIGH (ref 65–99)
Glucose-Capillary: 192 mg/dL — ABNORMAL HIGH (ref 65–99)
Glucose-Capillary: 76 mg/dL (ref 65–99)
Glucose-Capillary: 81 mg/dL (ref 65–99)

## 2017-07-10 LAB — LIPID PANEL
Cholesterol: 191 mg/dL (ref 0–200)
HDL: 24 mg/dL — AB (ref >40)
LDL CALC: 133 mg/dL — AB (ref 0–99)
Total CHOL/HDL Ratio: 8 RATIO
Triglycerides: 172 mg/dL — ABNORMAL HIGH (ref <150)
VLDL: 34 mg/dL (ref 0–40)

## 2017-07-10 LAB — TROPONIN I: Troponin I: <0.03 ng/mL (ref <0.03)

## 2017-07-10 LAB — ECHOCARDIOGRAM COMPLETE
HEIGHTINCHES: 71 in
Weight: 3568 oz

## 2017-07-10 LAB — MAGNESIUM: MAGNESIUM: 1.7 mg/dL (ref 1.7–2.4)

## 2017-07-10 MED ORDER — ASPIRIN 81 MG PO TBEC: 81.0000 mg | DELAYED_RELEASE_TABLET | Freq: Every day | ORAL | 0 refills | Status: DC

## 2017-07-10 MED ORDER — ATORVASTATIN CALCIUM 80 MG PO TABS: 80.0000 mg | ORAL_TABLET | Freq: Every day | ORAL | 0 refills | Status: DC

## 2017-07-10 MED ORDER — PERFLUTREN LIPID MICROSPHERE
1.0000 mL | INTRAVENOUS | Status: DC | PRN
  Administered 2017-07-10: 2 mL via INTRAVENOUS
  Filled 2017-07-10: qty 10

## 2017-07-10 MED ORDER — AMLODIPINE BESYLATE 5 MG PO TABS: 5.0000 mg | ORAL_TABLET | Freq: Every day | ORAL | 0 refills | Status: DC

## 2017-07-10 MED ORDER — ATORVASTATIN CALCIUM 80 MG PO TABS: 80.0000 mg | ORAL_TABLET | Freq: Every day | ORAL | Status: DC

## 2017-07-10 MED ORDER — AMLODIPINE BESYLATE 5 MG PO TABS
5.0000 mg | ORAL_TABLET | Freq: Every day | ORAL | Status: DC
  Administered 2017-07-10: 5 mg via ORAL
  Filled 2017-07-10: qty 1

## 2017-07-10 MED ORDER — MAGNESIUM SULFATE 2 GM/50ML IV SOLN
2.0000 g | Freq: Once | INTRAVENOUS | Status: AC
  Administered 2017-07-10: 2 g via INTRAVENOUS
  Filled 2017-07-10 (×2): qty 50

## 2017-07-10 MED ORDER — ASPIRIN EC 81 MG PO TBEC
81.0000 mg | DELAYED_RELEASE_TABLET | Freq: Every day | ORAL | Status: DC
  Administered 2017-07-10: 81 mg via ORAL
  Filled 2017-07-10: qty 1

## 2017-07-10 NOTE — Progress Notes (Signed)
  Echocardiogram 2D Echocardiogram has been performed.  [name] 07/10/2017, 10:08 AM

## 2017-07-10 NOTE — Consult Note (Signed)
Cardiology Consultation:   Patient ID: CAPTAIN BLUCHER; [id number]; [birth year]   Admit date: 07/09/2017 Date of Consult: 07/10/2017  Primary Care Provider: [name], MD Primary Cardiologist: Dr. Marlou Porch  Chief Complaint: chest pain  Patient Profile:   BENIGNO CHECK is a 66 y.o. male with a hx of DMT2, HTN, HLD, obesity, blindness, OSA, CAD (s/p prior stenting to OM and LCx, last intervention 2008, high-grade prox RCA and distal LAD treated medically) and history of non-compliance whom we are asked to see for chest pain at the request of Dr. Tana Coast.  History of Present Illness:   Last cath in 2008 showed 30% prox LAD, 80% distal LAD, 95% Lcx, 70% prox OM1, 60% prox OM2, 95% prox/mid RCA (shepherd's crook), s/p PCI to Cx. Other disease was treated medically. Myoview 9/14 was low risk with no ischemia. He was admitted 8/6-07/28/16 for increasing fatigue, chest pain and DOE. He ruled out for MI. 2D echo 07/2016 showed EF 60-65%, moderate LVH, G1DD and myoview was normal with no ischemia. Ranexa was added to his medical regimen.   He returns today with several week history of progressive DOE and chest burning with ambulation, relieved with rest, similar to sx prior to PCI. He reports some med noncompliance with B12 and Ranexa (due to cost) but cites that he has been taking imdur and coreg as recommended. He has not had any rest pain. Troponins neg x 3. BNP 3. LDL 133. Hgb 11.7-12.1, Cr 1.08, d-dimer negative. CXR NAD.  Past Medical History:  Diagnosis Date  . Arthritis    "mild arthritis in hip"  . CAD (coronary artery disease)   . CHF (congestive heart failure) ([place])   . Complication of anesthesia    "lungs filled up with fluid"   . Diabetes mellitus    Type II  . Glaucoma   . Heart murmur   . HTN (hypertension)   . Hypercholesterolemia   . LBP (low back pain)   . Legally blind   . Peripheral neuropathy   . Sleep apnea   . Staph infection    "from back surgery"  . Trigger finger     . Ulcer   . Wears glasses    "to protect cornea" - legally blind    Past Surgical History:  Procedure Laterality Date  . CHOLECYSTECTOMY  09/15/2012   Procedure: LAPAROSCOPIC CHOLECYSTECTOMY WITH INTRAOPERATIVE CHOLANGIOGRAM;  Surgeon: Pedro Earls, MD;  Location: [facility];  Service: General;  Laterality: N/A;  . EYE SURGERY Bilateral    cataracts  . INGUINAL HERNIA REPAIR     right  . LUMBAR FUSION    . LUMBAR LAMINECTOMY/DECOMPRESSION MICRODISCECTOMY N/A 10/26/2015   Procedure: RIGHT AND CENTRAL LUMBAR LAMINECTOMY L3-4, RIGHT L5-S1 LATERAL RECESS DECOMPRESSION;  Surgeon: Jessy Oto, MD;  Location: [facility];  Service: Orthopedics;  Laterality: N/A;  . NECK SURGERY    . peptic ulcer dz surgery  pt was in his 20s   bleeding ulcer.   . Prosthetic Cornea placement, Right eye  2007   [place] Right 10/26/2015   Procedure: RELEASE TRIGGER FINGER RIGHT THUMB;  Surgeon: Jessy Oto, MD;  Location: [facility];  Service: Orthopedics;  Laterality: Right;     Inpatient Medications: Scheduled Meds: . aspirin  325 mg Oral Daily  . carvedilol  12.5 mg Oral BID WC  . enoxaparin (LOVENOX) injection  40 mg Subcutaneous Q24H  . ezetimibe  10 mg Oral Daily  .  gabapentin  300 mg Oral Daily  . insulin aspart  0-5 Units Subcutaneous QHS  . insulin aspart  0-9 Units Subcutaneous TID WC  . insulin aspart protamine- aspart  35 Units Subcutaneous BID WC  . isosorbide mononitrate  120 mg Oral Daily  . pantoprazole  40 mg Oral BID  . pravastatin  40 mg Oral q1800  . ranolazine  500 mg Oral BID  . tamsulosin  0.4 mg Oral Daily  . cyanocobalamin  2,000 mcg Oral Daily   Continuous Infusions:  PRN Meds: acetaminophen, morphine injection, nitroGLYCERIN, ondansetron (ZOFRAN) IV, oxyCODONE-acetaminophen, traMADol  Allergies:    Allergies  Allergen Reactions  . Furosemide     pancreatitis  . Diamox [Acetazolamide] Itching  . Lisinopril Rash    Social History:   Social  History   Social History  . Marital status: Married    Spouse name: N/A  . Number of children: 3  . Years of education: 12   Occupational History  . disabled     blind   Social History Main Topics  . Smoking status: Never Smoker  . Smokeless tobacco: Never Used  . Alcohol use No  . Drug use: No  . Sexual activity: Not Currently   Other Topics Concern  . Not on file   Social History Narrative   Occupation: disabled, blind   Married   Regular Exercise-no   Lives at home with his wife.   Right-handed.   2-3 cups caffeine per day.       Family History:   The patient's family history includes Breast cancer in his mother; Colon cancer in his mother and other; Diabetes in his brother, mother, and sister; Hypertension in his father and mother.  ROS:  Please see the history of present illness.  All other ROS reviewed and negative.     Physical Exam/Data:   Vitals:   07/09/17 1930 07/09/17 2100 07/10/17 0022 07/10/17 0422  BP: (!) 142/76 (!) 148/66 (!) 152/59 (!) 141/68  Pulse: (!) 55 (!) 107 (!) 56 63  Resp: 14 18 19 18   Temp:  97.9 F (36.6 C) 98.1 F (36.7 C) 98.2 F (36.8 C)  TempSrc:  Oral Oral Oral  SpO2: 99% 99% 100% 98%  Weight:  224 lb 4.8 oz (101.7 kg)  223 lb (101.2 kg)  Height:  5\' 11"  (1.803 m)      Intake/Output Summary (Last 24 hours) at 07/10/17 0909 Last data filed at 07/10/17 0438  Gross per 24 hour  Intake              240 ml  Output              825 ml  Net             -585 ml   Filed Weights   07/09/17 2100 07/10/17 0422  Weight: 224 lb 4.8 oz (101.7 kg) 223 lb (101.2 kg)   Body mass index is 31.1 kg/m.  General: Well developed, well nourished, in no acute distress. Head: Normocephalic, atraumatic, legally blind with no formal eye contact, no xanthomas, nares are without discharge.  Neck: Negative for carotid bruits. JVD not elevated. Lungs: Clear bilaterally to auscultation without wheezes, rales, or rhonchi. Breathing is  unlabored. Heart: RRR with S1 S2. No murmurs, rubs, or gallops appreciated. Abdomen: Soft, non-tender, non-distended with normoactive bowel sounds. No hepatomegaly. No rebound/guarding. No obvious abdominal masses. Msk:  Strength and tone appear normal for age. Extremities: No clubbing or cyanosis. No  edema.  Distal pedal pulses are 2+ and equal bilaterally. Neuro: Alert and oriented X 3. No facial asymmetry. No focal deficit. Moves all extremities spontaneously. Psych:  Responds to questions appropriately with a normal affect.  EKG:  The EKG was personally reviewed and demonstrates  NSR 73bpm 1st degree AVB, nonspecific ST-T changes. Not significantly changed from prior.  Relevant CV Studies: LHC 2008 FINDINGS:  1. Left main artery - short.  No significant disease.  2. Left anterior descending artery - this is a large wrap-around      vessel proximal 30% stenosis.  There are 4 small diagonal branches      which are diffusely diseased.  There is distal 80% stenosis at the      apex of the distal LAD.  3. Left circumflex artery - there was a proximal 95% stenosis which is      new from prior catheterization dated 2007.  There are 3 obtuse      marginal branches, the first of which is a large branching obtuse      marginal.  The second branching segment of this first obtuse      marginal has a 70% proximal stenosis.  There is a 60% proximal      stenosis in the second obtuse marginal branch which is just distal      to the placed stent today.  4. Right coronary artery - this vessel is diffusely diseased and      dominant.  There is proximal/mid 95% stenosis.  This artery has a      shepherd's crook.  The remainder of the artery is quite diffusely      diseased.  5. Hemodynamics - left ventricle systolic pressure was 161, left      ventricular end-diastolic pressure was 17 mmHg.  Aortic pressure      was 096, systolic over 71, diastolic with a mean of 90 mmHg.  There      was no  significant gradient.  6. Left ventriculogram - estimated left ventricular ejection fraction      was 65%.  There is no significant mitral regurgitation (mitral      regurgitation was seen during the PVC, which is not clinical      significant).  There were no regional wall motion abnormalities.   Findings discussed with family.   IMPRESSION:  1. A 95% proximal circumflex stenosis - plan PCI, Dr. Rodell Perna,      bare metal stent 3.5 x 20 mm.  2. Proximal 95% stenosis of the right coronary artery in a diffusely      diseased vessel.  3. Distal LAD 80% stenosis at the apex.  4. Normal left ventricular ejection fraction estimated 65% with no      regional wall motion abnormalities.  5. After plain PCI, femoral angiogram was performed and Angio-Seal was      used as a arteriotomy closure device.  Laboratory Data:  Chemistry Recent Labs Lab 07/09/17 1609 07/09/17 2033  NA 137  --   K 4.3  --   CL 105  --   CO2 25  --   GLUCOSE 175*  --   BUN 10  --   CREATININE 1.08 1.03  CALCIUM 9.0  --   GFRNONAA >60 >60  GFRAA >60 >60  ANIONGAP 7  --    Hematology Recent Labs Lab 07/09/17 1609 07/09/17 2033  WBC 4.6 4.3  RBC 5.16 5.15  HGB 11.7* 12.1*  HCT 36.7* 36.6*  MCV 71.1* 71.1*  MCH 22.7* 23.5*  MCHC 31.9 33.1  RDW 14.3 14.9  PLT 195 160   Cardiac Enzymes Recent Labs Lab 07/09/17 2033 07/10/17 0225  TROPONINI <0.03 <0.03    Recent Labs Lab 07/09/17 1625  TROPIPOC 0.00    BNP Recent Labs Lab 07/09/17 1609  BNP 3.8    DDimer  Recent Labs Lab 07/09/17 1609  DDIMER <0.27    Radiology/Studies:  Dg Chest 2 View  Result Date: 07/09/2017 CLINICAL DATA:  Chest pain EXAM: CHEST  2 VIEW COMPARISON:  07/26/2016 FINDINGS: The heart size and mediastinal contours are within normal limits. Both lungs are clear. The visualized skeletal structures are unremarkable. IMPRESSION: No active cardiopulmonary disease. Electronically Signed   By: Inez Catalina M.D.    On: 07/09/2017 16:37    Assessment and Plan:   1. Chest pain with features mostly c/w angina pectoris - EKG nonacute and troponins negative, but symptoms concerning for progressive CAD. Cath 10 years ago had signficant disease in all 3 vessels. He reports taking his Imdur and Carvedilol but says he has been out of his Ranexa because he was not able to afford this. Based on his LDL I would be concerned he has also not been taking his lipid controlling therapy. His issues with noncompliance somewhat complicate the picture. I will discuss further steps with MD, I.e. LHC versus titration of medical therapy - could consider addition of amlodipine for antianginal effect. Will change full dose aspirin to 81mg  daily.  2. Noncompliance with some medications due to cost/affordability - importance of compliance reinforced. Will place care management consult to see if any options for assistance.  3. Essential HTN - BP remaining slightly above goal. Losartan on hold per IM admission orders. Will discuss regimen with MD in the context of #1.  4. Hyperlipidemia - LDL higher than I would expect given pravastatin and Zetia, suspect noncompliance contributing. Would consider consolidating these to atorvastatin 80mg  nightly.  5. Nocturnal leg cramps - will add Mg level to labs and defer further mgmt to IM. No specific sx to suggest claudication.  Signed, Charlie Pitter, PA-C  07/10/2017 9:09 AM

## 2017-07-10 NOTE — Progress Notes (Signed)
Nutrition Brief Note  Patient identified on the Malnutrition Screening Tool (MST) Report.  Wt Readings from Last 15 Encounters:  07/10/17 223 lb (101.2 kg)  12/09/16 233 lb 8 oz (105.9 kg)  11/18/16 234 lb 9.6 oz (106.4 kg)  09/12/16 237 lb 12.8 oz (107.9 kg)  08/14/16 231 lb 1.9 oz (104.8 kg)  08/05/16 232 lb 4 oz (105.3 kg)  07/28/16 224 lb 9.6 oz (101.9 kg)  05/22/16 244 lb (110.7 kg)  04/09/16 244 lb (110.7 kg)  10/26/15 241 lb 12.8 oz (109.7 kg)  10/23/15 241 lb 12.8 oz (109.7 kg)  09/26/15 238 lb (108 kg)  08/21/15 243 lb (110.2 kg)  04/10/15 244 lb (110.7 kg)  03/21/15 244 lb (110.7 kg)   Body mass index is 31.1 kg/m. Patient meets criteria for Obesity Class I based on current BMI.   Current diet order is NPO. Previously on a Carbohydrate Modified diet and pt was consuming 100% of meals.  Labs and medications reviewed. CBG's 109-140-81.  No nutrition interventions warranted at this time. If nutrition issues arise, please consult RD.   Arthur Holms, RD, LDN Pager #: [phone] After-Hours Pager #: [phone]

## 2017-07-10 NOTE — Progress Notes (Signed)
Chaplain provided Advance  Directive document for the patient, who is blind, he states his wife with assist him in completing it. He will inform the Nursing Staff when it is ready to be notarized. Chaplain Yaakov Guthrie [phone]

## 2017-07-10 NOTE — Progress Notes (Signed)
Received consult for medication needs; pt has private insurance with [employer] with prescription drug coverage; no needs identified; Aneta Mins [phone]

## 2017-07-10 NOTE — Discharge Summary (Signed)
Physician Discharge Summary  [name] YQM:[id number] DOB: 08-02-1951 DOA: 07/09/2017  PCP: [name], MD  Admit date: 07/09/2017 Discharge date: 07/10/2017  Admitted From:home Disposition:home  Recommendations for Outpatient Follow-up:  1. Follow up with PCP and cardiology in 1-2 weeks 2. Please obtain BMP/CBC in one week   Home Health:no Equipment/Devices:none Discharge Condition:stable CODE STATUS:full code Diet recommendation:carb modified heart healthy diet  Brief/Interim Summary: 66 year old legally blind, history of diabetes, hypertension, hyperlipidemia, coronary artery disease with 2 prior cardiac stents, BPH presented to ED with exertional chest pain, shortness of breath for the last 1 week.Patient also has history of medical noncompliance. He presented with increasing fatigue, chest pain and dyspnea on exertion. Cardiac enzymes are negative. LDL elevated. EKG with no ischemic changes. Patient was admitted for chest pain evaluation. Patient was evaluated by cardiologist. Echocardiogram with EF of 60-65% with normal wall motion and mild diastolic dysfunction. Cardiology recommended to change statin to Lipitor and added Norvasc and aspirin in patient's medical regimen. Blood sugar were on lower side because of his nothing by mouth status. Diet resumed. Recommended to continue home medication and close monitoring of blood sugar level and blood pressure home. I informed cardiology scheduling regarding patient discharge and outpatient follow-up. The clinic will call the patient with outpatient cardiology clinic follow-up appointment. No further intervention recommended by cardiologist during this hospitalization. Patient denied chest pain or shortness of breath this morning. He will be discharged home in stable condition and outpatient follow-up. Reinforced medical compliance. Treated with IV magnesium for borderline low hypomagnesemia. Recommended outpatient lab  monitoring.  Discharge Diagnoses:  Principal Problem:   Chest pain Active Problems:   HYPERTENSION, BENIGN ESSENTIAL   BPH (benign prostatic hyperplasia)   Hyperlipidemia LDL goal <70   GERD (gastroesophageal reflux disease)   Type 2 diabetes mellitus with complication (HCC)   Chronic diastolic heart failure [facility])    Discharge Instructions  Discharge Instructions    Call MD for:  difficulty breathing, headache or visual disturbances    Complete by:  As directed    Call MD for:  extreme fatigue    Complete by:  As directed    Call MD for:  hives    Complete by:  As directed    Call MD for:  persistant dizziness or light-headedness    Complete by:  As directed    Call MD for:  persistant nausea and vomiting    Complete by:  As directed    Call MD for:  severe uncontrolled pain    Complete by:  As directed    Call MD for:  temperature >100.4    Complete by:  As directed    Diet - low sodium heart healthy    Complete by:  As directed    Diet Carb Modified    Complete by:  As directed    Increase activity slowly    Complete by:  As directed      Allergies as of 07/10/2017      Reactions   Furosemide    pancreatitis   Diamox [acetazolamide] Itching   Lisinopril Rash      Medication List    STOP taking these medications   oxyCODONE-acetaminophen 5-325 MG tablet Commonly known as:  PERCOCET/ROXICET   pravastatin 40 MG tablet Commonly known as:  PRAVACHOL     TAKE these medications   amLODipine 5 MG tablet Commonly known as:  NORVASC Take 1 tablet (5 mg total) by mouth daily.   aspirin 81  MG EC tablet Take 1 tablet (81 mg total) by mouth daily.   atorvastatin 80 MG tablet Commonly known as:  LIPITOR Take 1 tablet (80 mg total) by mouth daily at 6 PM.   carvedilol 12.5 MG tablet Commonly known as:  COREG TAKE 1 TABLET BY MOUTH TWICE A DAY WITH A MEAL What changed:  See the new instructions.   cyanocobalamin 2000 MCG tablet Take 1 tablet (2,000 mcg  total) by mouth daily.   ezetimibe 10 MG tablet Commonly known as:  ZETIA Take 1 tablet (10 mg total) by mouth daily.   gabapentin 300 MG capsule Commonly known as:  NEURONTIN Take 300 mg by mouth daily.   glucose blood test strip Commonly known as:  ONETOUCH VERIO Use BID to check BS. DX E11.9   HUMALOG MIX 75/25 (75-25) 100 UNIT/ML Susp injection Generic drug:  insulin lispro protamine-lispro INJECT 85 UNITS IN THE MORNING AND 45 UNITS IN [facility] EVENING What changed:  Another medication with the same name was removed. Continue taking this medication, and follow the directions you see here.   isosorbide mononitrate 120 MG 24 hr tablet Commonly known as:  IMDUR TAKE 1 TABLET (120 MG TOTAL) BY MOUTH DAILY.   JANUMET 50-1000 MG tablet Generic drug:  sitaGLIPtin-metformin TAKE 1 TABLET BY MOUTH TWICE A DAY WITH A MEAL   losartan 50 MG tablet Commonly known as:  COZAAR TAKE 1 TABLET BY MOUTH DAILY What changed:  See the new instructions.   nitroGLYCERIN 0.4 MG SL tablet Commonly known as:  NITROSTAT Place 1 tablet (0.4 mg total) under the tongue every 5 (five) minutes as needed for chest pain.   omega-3 acid ethyl esters 1 g capsule Commonly known as:  LOVAZA Take 2 capsules (2 g total) by mouth 2 (two) times daily.   ONETOUCH VERIO Soln 1 Act by In Vitro route 2 (two) times daily.   pantoprazole 40 MG tablet Commonly known as:  PROTONIX Take 1 tablet (40 mg total) by mouth 2 (two) times daily.   PRODIGY LANCETS 28G Misc Use to check blood sugars twice a day Dx e11.9   PRODIGY VOICE BLOOD GLUCOSE w/Device Kit Use to check blood sugars twice a day Dx e11.9   ranolazine 500 MG 12 hr tablet Commonly known as:  RANEXA Take 1 tablet (500 mg total) by mouth 2 (two) times daily.   tamsulosin 0.4 MG Caps capsule Commonly known as:  FLOMAX Take 1 capsule (0.4 mg total) by mouth daily.   traMADol 50 MG tablet Commonly known as:  ULTRAM Take 1 tablet (50 mg total) by  mouth every 8 (eight) hours as needed.      Follow-up Information    [name], MD. Schedule an appointment as soon as possible for a visit in 1 week(s).   Specialty:  Internal Medicine Contact information: [address] [address] [phone]        Jerline Pain, MD. Schedule an appointment as soon as possible for a visit in 2 week(s).   Specialty:  Cardiology Why:  clinic will call you with appointment. Contact information: [address] [place] [address] [phone]          Allergies  Allergen Reactions  . Furosemide     pancreatitis  . Diamox [Acetazolamide] Itching  . Lisinopril Rash    Consultations: Cardiology  Procedures/Studies: Echo  Subjective: Seen and examined at bedside. Patient was lying on bed. Denied chest  pain, shortness of breath. No headache, dizziness, nausea or vomiting. Discharge Exam: Vitals:   07/10/17 0022 07/10/17 0422  BP: (!) 152/59 (!) 141/68  Pulse: (!) 56 63  Resp: 19 18  Temp: 98.1 F (36.7 C) 98.2 F (36.8 C)   Vitals:   07/09/17 1930 07/09/17 2100 07/10/17 0022 07/10/17 0422  BP: (!) 142/76 (!) 148/66 (!) 152/59 (!) 141/68  Pulse: (!) 55 (!) 107 (!) 56 63  Resp: _0 Temp:  97.9 F (36.6 C) 98.1 F (36.7 C) 98.2 F (36.8 C)  TempSrc:  Oral Oral Oral  SpO2: 99% 99% 100% 98%  Weight:  101.7 kg (224 lb 4.8 oz)  101.2 kg (223 lb)  Height:  _1  (1.803 m)      General: Pt is alert, awake, not in acute distress, Legally blind Cardiovascular: RRR, S1/S2 +, no rubs, no gallops Respiratory: CTA bilaterally, no wheezing, no rhonchi Abdominal: Soft, NT, ND, bowel sounds + Extremities: no edema, no cyanosis    The results of significant diagnostics from this hospitalization (including imaging, microbiology, ancillary and laboratory) are listed below for reference.     Microbiology: No results found for this or any previous visit (from the past 240  hour(s)).   Labs: BNP (last 3 results)  Recent Labs  07/27/16 0630 07/09/17 1609  BNP 4.1 3.8   Basic Metabolic Panel:  Recent Labs Lab 07/09/17 1609 07/09/17 2033 07/10/17 0939  NA 137  --   --   K 4.3  --   --   CL 105  --   --   CO2 25  --   --   GLUCOSE 175*  --   --   BUN 10  --   --   CREATININE 1.08 1.03  --   CALCIUM 9.0  --   --   MG  --   --  1.7   Liver Function Tests: No results for input(s): AST, ALT, ALKPHOS, BILITOT, PROT, ALBUMIN in the last 168 hours. No results for input(s): LIPASE, AMYLASE in the last 168 hours. No results for input(s): AMMONIA in the last 168 hours. CBC:  Recent Labs Lab 07/09/17 1609 07/09/17 2033  WBC 4.6 4.3  HGB 11.7* 12.1*  HCT 36.7* 36.6*  MCV 71.1* 71.1*  PLT 195 160   Cardiac Enzymes:  Recent Labs Lab 07/09/17 2033 07/10/17 0225 07/10/17 0827  TROPONINI <0.03 <0.03 <0.03   BNP: Invalid input(s): POCBNP CBG:  Recent Labs Lab 07/09/17 2029 07/10/17 0029 07/10/17 0732 07/10/17 1210  GLUCAP 109* 140* 81 76   D-Dimer  Recent Labs  07/09/17 1609  DDIMER <0.27   Hgb A1c No results for input(s): HGBA1C in the last 72 hours. Lipid Profile  Recent Labs  07/10/17 0225  CHOL 191  HDL 24*  LDLCALC 133*  TRIG 172*  CHOLHDL 8.0   Thyroid function studies No results for input(s): TSH, T4TOTAL, T3FREE, THYROIDAB in the last 72 hours.  Invalid input(s): FREET3 Anemia work up No results for input(s): VITAMINB12, FOLATE, FERRITIN, TIBC, IRON, RETICCTPCT in the last 72 hours. Urinalysis    Component Value Date/Time   COLORURINE YELLOW 04/09/2016 [place] 04/09/2016 1041   LABSPEC 1.015 04/09/2016 [place] 7.0 04/09/2016 [place] 04/09/2016 [place] 04/09/2016 1041   BILIRUBINUR NEGATIVE 04/09/2016 1041   BILIRUBINUR negative 05/25/2014 [place] 04/09/2016 [place] 10/27/2015 2303  UROBILINOGEN 0.2  04/09/2016 1041   NITRITE NEGATIVE 04/09/2016 1041   LEUKOCYTESUR NEGATIVE 04/09/2016 1041   Sepsis Labs Invalid input(s): PROCALCITONIN,  WBC,  LACTICIDVEN Microbiology No results found for this or any previous visit (from the past 240 hour(s)).   Time coordinating discharge: 28 minutes  SIGNED:   Rosita Fire, MD  [facility] 07/10/2017, 1:41 PM  If 7PM-7AM, please contact night-coverage [email or web address] Password [email or web address]

## 2017-07-10 NOTE — Progress Notes (Signed)
No chest pain throughout the night. Complained of shoulder nerve pain, leg cramps, and not being ale to sleep.

## 2017-07-10 NOTE — Progress Notes (Signed)
Please inform cardiology service when patient is being dc so f/u can be arranged (can send staff msg to Gay Filler). Dayna Dunn PA-C

## 2017-07-11 LAB — HEMOGLOBIN A1C
Hgb A1c MFr Bld: 7 % — ABNORMAL HIGH (ref 4.8–5.6)
Mean Plasma Glucose: 154 mg/dL

## 2017-07-20 ENCOUNTER — Inpatient Hospital Stay: Payer: PPO | Admitting: Internal Medicine

## 2017-07-22 ENCOUNTER — Encounter: Payer: Self-pay | Admitting: Internal Medicine

## 2017-07-22 ENCOUNTER — Ambulatory Visit (INDEPENDENT_AMBULATORY_CARE_PROVIDER_SITE_OTHER): Payer: PPO | Admitting: Internal Medicine

## 2017-07-22 ENCOUNTER — Other Ambulatory Visit (INDEPENDENT_AMBULATORY_CARE_PROVIDER_SITE_OTHER): Payer: PPO

## 2017-07-22 ENCOUNTER — Encounter: Payer: Self-pay | Admitting: Nurse Practitioner

## 2017-07-22 ENCOUNTER — Ambulatory Visit (INDEPENDENT_AMBULATORY_CARE_PROVIDER_SITE_OTHER): Payer: PPO | Admitting: Nurse Practitioner

## 2017-07-22 VITALS — BP 122/68 | HR 82 | Temp 98.3°F | Resp 16 | Ht 71.0 in | Wt 232.0 lb

## 2017-07-22 DIAGNOSIS — R251 Tremor, unspecified: Secondary | ICD-10-CM | POA: Diagnosis not present

## 2017-07-22 DIAGNOSIS — E118 Type 2 diabetes mellitus with unspecified complications: Secondary | ICD-10-CM | POA: Diagnosis not present

## 2017-07-22 DIAGNOSIS — Z794 Long term (current) use of insulin: Secondary | ICD-10-CM | POA: Diagnosis not present

## 2017-07-22 DIAGNOSIS — I5022 Chronic systolic (congestive) heart failure: Secondary | ICD-10-CM | POA: Diagnosis not present

## 2017-07-22 DIAGNOSIS — E1169 Type 2 diabetes mellitus with other specified complication: Secondary | ICD-10-CM | POA: Diagnosis not present

## 2017-07-22 DIAGNOSIS — I10 Essential (primary) hypertension: Secondary | ICD-10-CM | POA: Diagnosis not present

## 2017-07-22 DIAGNOSIS — I2510 Atherosclerotic heart disease of native coronary artery without angina pectoris: Secondary | ICD-10-CM | POA: Diagnosis not present

## 2017-07-22 DIAGNOSIS — D510 Vitamin B12 deficiency anemia due to intrinsic factor deficiency: Secondary | ICD-10-CM | POA: Diagnosis not present

## 2017-07-22 DIAGNOSIS — J301 Allergic rhinitis due to pollen: Secondary | ICD-10-CM | POA: Diagnosis not present

## 2017-07-22 DIAGNOSIS — M961 Postlaminectomy syndrome, not elsewhere classified: Secondary | ICD-10-CM | POA: Diagnosis not present

## 2017-07-22 DIAGNOSIS — E785 Hyperlipidemia, unspecified: Secondary | ICD-10-CM | POA: Diagnosis not present

## 2017-07-22 DIAGNOSIS — E0849 Diabetes mellitus due to underlying condition with other diabetic neurological complication: Secondary | ICD-10-CM | POA: Diagnosis not present

## 2017-07-22 DIAGNOSIS — M5416 Radiculopathy, lumbar region: Secondary | ICD-10-CM

## 2017-07-22 DIAGNOSIS — D539 Nutritional anemia, unspecified: Secondary | ICD-10-CM

## 2017-07-22 DIAGNOSIS — Z6835 Body mass index (BMI) 35.0-35.9, adult: Secondary | ICD-10-CM

## 2017-07-22 DIAGNOSIS — D638 Anemia in other chronic diseases classified elsewhere: Secondary | ICD-10-CM | POA: Insufficient documentation

## 2017-07-22 LAB — CBC WITH DIFFERENTIAL/PLATELET
BASOS PCT: 0.6 % (ref 0.0–3.0)
Basophils Absolute: 0 10*3/uL (ref 0.0–0.1)
EOS ABS: 0.1 10*3/uL (ref 0.0–0.7)
Eosinophils Relative: 2.5 % (ref 0.0–5.0)
HCT: 37 % — ABNORMAL LOW (ref 39.0–52.0)
HEMOGLOBIN: 11.7 g/dL — AB (ref 13.0–17.0)
LYMPHS ABS: 2 10*3/uL (ref 0.7–4.0)
Lymphocytes Relative: 32.2 % (ref 12.0–46.0)
MCHC: 31.7 g/dL (ref 30.0–36.0)
MCV: 73.2 fl — ABNORMAL LOW (ref 78.0–100.0)
MONO ABS: 0.5 10*3/uL (ref 0.1–1.0)
Monocytes Relative: 8.2 % (ref 3.0–12.0)
NEUTROS PCT: 56.5 % (ref 43.0–77.0)
Neutro Abs: 3.4 10*3/uL (ref 1.4–7.7)
PLATELETS: 212 10*3/uL (ref 150.0–400.0)
RBC: 5.06 Mil/uL (ref 4.22–5.81)
RDW: 15.3 % (ref 11.5–15.5)
WBC: 6.1 10*3/uL (ref 4.0–10.5)

## 2017-07-22 LAB — MICROALBUMIN / CREATININE URINE RATIO
CREATININE, U: 140.1 mg/dL
MICROALB/CREAT RATIO: 0.4 mg/g (ref 0.0–30.0)
Microalb, Ur: 0.6 mg/dL (ref 0.0–1.9)

## 2017-07-22 LAB — IBC PANEL
IRON: 68 ug/dL (ref 42–165)
Saturation Ratios: 22.3 % (ref 20.0–50.0)
TRANSFERRIN: 218 mg/dL (ref 212.0–360.0)

## 2017-07-22 LAB — FOLATE: Folate: 8 ng/mL (ref >5.9)

## 2017-07-22 LAB — VITAMIN B12: Vitamin B-12: 765 pg/mL (ref 211–911)

## 2017-07-22 LAB — FERRITIN: FERRITIN: 46.1 ng/mL (ref 22.0–322.0)

## 2017-07-22 MED ORDER — CYANOCOBALAMIN 2000 MCG PO TABS: 2000.0000 ug | ORAL_TABLET | Freq: Every day | ORAL | 3 refills | Status: DC

## 2017-07-22 MED ORDER — LOSARTAN POTASSIUM 50 MG PO TABS: 50.0000 mg | ORAL_TABLET | Freq: Every day | ORAL | 3 refills | Status: DC

## 2017-07-22 MED ORDER — AMLODIPINE BESYLATE 5 MG PO TABS: 5.0000 mg | ORAL_TABLET | Freq: Every day | ORAL | 3 refills | Status: DC

## 2017-07-22 MED ORDER — LEVOCETIRIZINE DIHYDROCHLORIDE 5 MG PO TABS: 5.0000 mg | ORAL_TABLET | Freq: Every evening | ORAL | 3 refills | Status: DC

## 2017-07-22 MED ORDER — TRAMADOL HCL 50 MG PO TABS: 50.0000 mg | ORAL_TABLET | Freq: Three times a day (TID) | ORAL | 3 refills | Status: DC | PRN

## 2017-07-22 MED ORDER — EZETIMIBE 10 MG PO TABS: 10.0000 mg | ORAL_TABLET | Freq: Every day | ORAL | 3 refills | Status: DC

## 2017-07-22 MED ORDER — ASPIRIN 81 MG PO TBEC: 81.0000 mg | DELAYED_RELEASE_TABLET | Freq: Every day | ORAL | 1 refills | Status: DC

## 2017-07-22 MED ORDER — CARVEDILOL 12.5 MG PO TABS: ORAL_TABLET | ORAL | 3 refills | Status: DC

## 2017-07-22 MED ORDER — NITROGLYCERIN 0.4 MG SL SUBL: 0.4000 mg | SUBLINGUAL_TABLET | SUBLINGUAL | 3 refills | Status: DC | PRN

## 2017-07-22 MED ORDER — FREESTYLE LIBRE READER DEVI: 1.0000 | Freq: Three times a day (TID) | 1 refills | Status: DC

## 2017-07-22 MED ORDER — ATORVASTATIN CALCIUM 80 MG PO TABS: 80.0000 mg | ORAL_TABLET | Freq: Every day | ORAL | 1 refills | Status: DC

## 2017-07-22 MED ORDER — FREESTYLE LIBRE SENSOR SYSTEM MISC: 1.0000 | Freq: Three times a day (TID) | 1 refills | Status: DC

## 2017-07-22 MED ORDER — ISOSORBIDE MONONITRATE ER 120 MG PO TB24: ORAL_TABLET | ORAL | 3 refills | Status: DC

## 2017-07-22 MED ORDER — FLUTICASONE PROPIONATE 50 MCG/ACT NA SUSP: 2.0000 | Freq: Every day | NASAL | 3 refills | Status: DC

## 2017-07-22 NOTE — Progress Notes (Signed)
Subjective:  Patient ID: [name], male    DOB: Oct 27, 1951  Age: 66 y.o. MRN: [id number]  CC: Hospitalization Follow-up (sinus problem-nasal drip/free syle libre consult/handicap form to fill out/hand twitching? med refill for 90 days?); Hypertension; Hyperlipidemia; Diabetes; and Coronary Artery Disease   HPI [name] presents for f/up after recent admission for CP. His workup for ischemia was negative and he tells me the chest pain has resolved. He complains of declining memory and tremors in his left hand for several months. He complains of nasal congestion, runny nose, and postnasal drip. He has not been taking his B12 supplement.  Outpatient Medications Prior to Visit  Medication Sig Dispense Refill  . Blood Glucose Calibration (ONETOUCH VERIO) SOLN 1 Act by In Vitro route 2 (two) times daily. 1 each 11  . Blood Glucose Monitoring Suppl (PRODIGY VOICE BLOOD GLUCOSE) W/DEVICE KIT Use to check blood sugars twice a day Dx e11.9 1 kit 0  . gabapentin (NEURONTIN) 300 MG capsule Take 300 mg by mouth daily.    Marland Kitchen glucose blood (ONETOUCH VERIO) test strip Use BID to check BS. DX E11.9 100 each 12  . HUMALOG MIX 75/25 (75-25) 100 UNIT/ML SUSP injection INJECT 85 UNITS IN THE MORNING AND 45 UNITS IN [facility] EVENING 100 mL 6  . JANUMET 50-1000 MG tablet TAKE 1 TABLET BY MOUTH TWICE A DAY WITH A MEAL 180 tablet 1  . omega-3 acid ethyl esters (LOVAZA) 1 g capsule Take 2 capsules (2 g total) by mouth 2 (two) times daily. 360 capsule 2  . pantoprazole (PROTONIX) 40 MG tablet Take 1 tablet (40 mg total) by mouth 2 (two) times daily. 180 tablet 3  . PRODIGY LANCETS 28G MISC Use to check blood sugars twice a day Dx e11.9 100 each 3  . ranolazine (RANEXA) 500 MG 12 hr tablet Take 1 tablet (500 mg total) by mouth 2 (two) times daily. 180 tablet 3  . tamsulosin (FLOMAX) 0.4 MG CAPS capsule Take 1 capsule (0.4 mg total) by mouth daily. 15 capsule 1  . amLODipine (NORVASC) 5 MG tablet Take 1 tablet (5  mg total) by mouth daily. 30 tablet 0  . aspirin EC 81 MG EC tablet Take 1 tablet (81 mg total) by mouth daily. 30 tablet 0  . atorvastatin (LIPITOR) 80 MG tablet Take 1 tablet (80 mg total) by mouth daily at 6 PM. 30 tablet 0  . carvedilol (COREG) 12.5 MG tablet TAKE 1 TABLET BY MOUTH TWICE A DAY WITH A MEAL (Patient taking differently: TAKE 12.5MG BY MOUTH TWICE A DAY WITH A MEAL) 180 tablet 3  . ezetimibe (ZETIA) 10 MG tablet Take 1 tablet (10 mg total) by mouth daily. 90 tablet 3  . isosorbide mononitrate (IMDUR) 120 MG 24 hr tablet TAKE 1 TABLET (120 MG TOTAL) BY MOUTH DAILY. 90 tablet 1  . losartan (COZAAR) 50 MG tablet TAKE 1 TABLET BY MOUTH DAILY (Patient taking differently: TAKE 50MG BY MOUTH DAILY) 90 tablet 1  . traMADol (ULTRAM) 50 MG tablet Take 1 tablet (50 mg total) by mouth every 8 (eight) hours as needed. 270 tablet 3  . cyanocobalamin 2000 MCG tablet Take 1 tablet (2,000 mcg total) by mouth daily. (Patient not taking: Reported on 07/22/2017) 90 tablet 3  . nitroGLYCERIN (NITROSTAT) 0.4 MG SL tablet Place 1 tablet (0.4 mg total) under the tongue every 5 (five) minutes as needed for chest pain. 25 tablet 3   No facility-administered medications prior to visit.  ROS Review of Systems  Constitutional: Negative for appetite change, chills, diaphoresis, fatigue and unexpected weight change.  HENT: Positive for congestion, postnasal drip and rhinorrhea. Negative for facial swelling, nosebleeds, sinus pressure, sore throat, trouble swallowing and voice change.   Eyes: Negative.   Respiratory: Negative for cough and chest tightness.   Cardiovascular: Negative for chest pain, palpitations and leg swelling.  Gastrointestinal: Negative for abdominal pain, constipation, diarrhea, nausea and vomiting.  Endocrine: Negative.  Negative for polydipsia, polyphagia and polyuria.  Genitourinary: Negative.  Negative for difficulty urinating.  Musculoskeletal: Positive for back pain. Negative for  arthralgias and myalgias.  Skin: Negative.  Negative for rash.  Allergic/Immunologic: Negative.   Neurological: Positive for tremors. Negative for dizziness.  Hematological: Negative for adenopathy. Does not bruise/bleed easily.  Psychiatric/Behavioral: Positive for decreased concentration. Negative for dysphoric mood and sleep disturbance. The patient is not nervous/anxious.     Objective:  BP 122/68   Pulse 82   Temp 98.3 F (36.8 C)   Resp 16   Ht 5' 11"  (1.803 m)   Wt 232 lb (105.2 kg)   SpO2 97%   BMI 32.36 kg/m   BP Readings from Last 3 Encounters:  07/22/17 (!) 110/50  07/22/17 122/68  07/10/17 114/64    Wt Readings from Last 3 Encounters:  07/22/17 244 lb (110.7 kg)  07/22/17 232 lb (105.2 kg)  07/10/17 223 lb (101.2 kg)    Physical Exam  Constitutional: He is oriented to person, place, and time. No distress.  HENT:  Mouth/Throat: Oropharynx is clear and moist. No oropharyngeal exudate.  Eyes: Conjunctivae are normal. Right eye exhibits no discharge. Left eye exhibits no discharge. No scleral icterus.  Neck: Normal range of motion. Neck supple. No JVD present. No thyromegaly present.  Cardiovascular: Normal rate, regular rhythm and intact distal pulses.  Exam reveals no gallop and no friction rub.   No murmur heard. Pulmonary/Chest: Effort normal and breath sounds normal. No respiratory distress. He has no wheezes. He has no rales. He exhibits no tenderness.  Abdominal: Soft. Bowel sounds are normal. He exhibits no distension and no mass. There is no tenderness. There is no rebound and no guarding.  Musculoskeletal: Normal range of motion. He exhibits edema (trace edema BLE). He exhibits no tenderness or deformity.  Lymphadenopathy:    He has no cervical adenopathy.  Neurological: He is alert and oriented to person, place, and time.  Skin: Skin is warm and dry. No rash noted. He is not diaphoretic. No erythema. No pallor.  Vitals reviewed.   Lab Results    Component Value Date   WBC 6.1 07/22/2017   HGB 11.7 (L) 07/22/2017   HCT 37.0 (L) 07/22/2017   PLT 212.0 07/22/2017   GLUCOSE 175 (H) 07/09/2017   CHOL 191 07/10/2017   TRIG 172 (H) 07/10/2017   HDL 24 (L) 07/10/2017   LDLDIRECT 82.7 04/04/2013   LDLCALC 133 (H) 07/10/2017   ALT 12 12/09/2016   AST 12 12/09/2016   NA 137 07/09/2017   K 4.3 07/09/2017   CL 105 07/09/2017   CREATININE 1.03 07/09/2017   BUN 10 07/09/2017   CO2 25 07/09/2017   TSH 1.50 04/09/2016   PSA 1.99 04/09/2016   INR 0.95 08/17/2010   HGBA1C 7.0 (H) 07/09/2017   MICROALBUR 0.6 07/22/2017    Dg Chest 2 View  Result Date: 07/09/2017 CLINICAL DATA:  Chest pain EXAM: CHEST  2 VIEW COMPARISON:  07/26/2016 FINDINGS: The heart size and mediastinal contours are within  normal limits. Both lungs are clear. The visualized skeletal structures are unremarkable. IMPRESSION: No active cardiopulmonary disease. Electronically Signed   By: Inez Catalina M.D.   On: 07/09/2017 16:37    Assessment & Plan:   Kieon was seen today for hospitalization follow-up, hypertension, hyperlipidemia, diabetes and coronary artery disease.  Diagnoses and all orders for this visit:  Deficiency anemia- his H&H have declined since discharge. I will screen him for vitamin deficiencies. -     CBC with Differential/Platelet; Future -     IBC panel; Future -     Folate; Future -     Ferritin; Future -     Vitamin B12; Future -     Vitamin B1; Future  HYPERTENSION, BENIGN ESSENTIAL- his blood pressure is over controlled and he has lower extremity edema. I've asked him to stop taking amlodipine.  Type 2 diabetes mellitus with complication, with long-term current use of insulin ([place])- his blood sugars adequately well controlled. -     Microalbumin / creatinine urine ratio; Future -     Continuous Blood Gluc Receiver (FREESTYLE LIBRE READER) DEVI; 1 Act by Does not apply route 3 (three) times daily with meals. -     Continuous Blood Gluc  Sensor ([place]) MISC; 1 Act by Does not apply route 3 (three) times daily with meals.  Atherosclerosis of native coronary artery of native heart without angina pectoris- will continue risk factor modification. -     atorvastatin (LIPITOR) 80 MG tablet; Take 1 tablet (80 mg total) by mouth daily at 6 PM. -     ezetimibe (ZETIA) 10 MG tablet; Take 1 tablet (10 mg total) by mouth daily. -     nitroGLYCERIN (NITROSTAT) 0.4 MG SL tablet; Place 1 tablet (0.4 mg total) under the tongue every 5 (five) minutes as needed for chest pain. -     Discontinue: aspirin 81 MG EC tablet; Take 1 tablet (81 mg total) by mouth daily. -     aspirin 81 MG EC tablet; Take 1 tablet (81 mg total) by mouth daily.  Hyperlipidemia LDL goal <70- he has not achieved his LDL goal. Will switch to a higher potency statin and will add Zetia. -     atorvastatin (LIPITOR) 80 MG tablet; Take 1 tablet (80 mg total) by mouth daily at 6 PM. -     ezetimibe (ZETIA) 10 MG tablet; Take 1 tablet (10 mg total) by mouth daily.  Class 2 severe obesity due to excess calories with serious comorbidity and body mass index (BMI) of 35.0 to 35.9 in adult [facility])- he is working on his lifestyle modifications to lose weight.  Vitamin B12 deficiency anemia due to intrinsic factor deficiency- will restart B12 replacement therapy. -     cyanocobalamin 2000 MCG tablet; Take 1 tablet (2,000 mcg total) by mouth daily.  Seasonal allergic rhinitis due to pollen -     fluticasone (FLONASE) 50 MCG/ACT nasal spray; Place 2 sprays into both nostrils daily. -     levocetirizine (XYZAL) 5 MG tablet; Take 1 tablet (5 mg total) by mouth every evening.  Tremor of left hand -     Ambulatory referral to Neurology  Other diabetic neurological complication associated with diabetes mellitus due to underlying condition (HCC) -     traMADol (ULTRAM) 50 MG tablet; Take 1 tablet (50 mg total) by mouth every 8 (eight) hours as needed.  Lumbar  radiculopathy -     traMADol (ULTRAM) 50 MG tablet;  Take 1 tablet (50 mg total) by mouth every 8 (eight) hours as needed.  Postlaminectomy syndrome, lumbar region -     traMADol (ULTRAM) 50 MG tablet; Take 1 tablet (50 mg total) by mouth every 8 (eight) hours as needed.   I have discontinued Mr. Kreager's amLODipine. I am also having him start on fluticasone, levocetirizine, FREESTYLE LIBRE READER, and [place]. Additionally, I am having him maintain his ONETOUCH VERIO, PRODIGY VOICE BLOOD GLUCOSE, tamsulosin, glucose blood, PRODIGY LANCETS 28G, gabapentin, pantoprazole, ranolazine, HUMALOG MIX 75/25, JANUMET, omega-3 acid ethyl esters, atorvastatin, ezetimibe, cyanocobalamin, nitroGLYCERIN, aspirin, and traMADol.  Meds ordered this encounter  Medications  . atorvastatin (LIPITOR) 80 MG tablet    Sig: Take 1 tablet (80 mg total) by mouth daily at 6 PM.    Dispense:  90 tablet    Refill:  1  . ezetimibe (ZETIA) 10 MG tablet    Sig: Take 1 tablet (10 mg total) by mouth daily.    Dispense:  90 tablet    Refill:  3  . cyanocobalamin 2000 MCG tablet    Sig: Take 1 tablet (2,000 mcg total) by mouth daily.    Dispense:  90 tablet    Refill:  3  . nitroGLYCERIN (NITROSTAT) 0.4 MG SL tablet    Sig: Place 1 tablet (0.4 mg total) under the tongue every 5 (five) minutes as needed for chest pain.    Dispense:  25 tablet    Refill:  3  . fluticasone (FLONASE) 50 MCG/ACT nasal spray    Sig: Place 2 sprays into both nostrils daily.    Dispense:  48 g    Refill:  3  . levocetirizine (XYZAL) 5 MG tablet    Sig: Take 1 tablet (5 mg total) by mouth every evening.    Dispense:  90 tablet    Refill:  3  . Continuous Blood Gluc Receiver (FREESTYLE LIBRE READER) DEVI    Sig: 1 Act by Does not apply route 3 (three) times daily with meals.    Dispense:  1 Device    Refill:  1  . Continuous Blood Gluc Sensor (FREESTYLE LIBRE SENSOR SYSTEM) MISC    Sig: 1 Act by Does not apply route  3 (three) times daily with meals.    Dispense:  1 each    Refill:  1  . DISCONTD: aspirin 81 MG EC tablet    Sig: Take 1 tablet (81 mg total) by mouth daily.    Dispense:  90 tablet    Refill:  1  . aspirin 81 MG EC tablet    Sig: Take 1 tablet (81 mg total) by mouth daily.    Dispense:  90 tablet    Refill:  1  . traMADol (ULTRAM) 50 MG tablet    Sig: Take 1 tablet (50 mg total) by mouth every 8 (eight) hours as needed.    Dispense:  270 tablet    Refill:  3     Follow-up: Return in about 4 months (around 11/21/2017).  [name], MD

## 2017-07-22 NOTE — Patient Instructions (Signed)

## 2017-07-22 NOTE — Patient Instructions (Addendum)
We will be checking the following labs today - NONE   Medication Instructions:    Continue with your current medicines.   I refilled your medicines today    Testing/Procedures To Be Arranged:  N/A  Follow-Up:   See Dr. Marlou Porch in November    Other Special Instructions:   N/A    If you need a refill on your cardiac medications before your next appointment, please call your pharmacy.   Call the [place] office at [phone] if you have any questions, problems or concerns.

## 2017-07-22 NOTE — Progress Notes (Addendum)
CARDIOLOGY OFFICE NOTE  Date:  07/22/2017    [name] Date of Birth: January 01, 1951 Medical Record #[id number]  PCP:  [name], MD  Cardiologist:  [facility]  Chief Complaint  Patient presents with  . Coronary Artery Disease    Post hospital visit - seen for Dr. Marlou Porch    History of Present Illness: [name] is a 66 y.o. male who presents today for a follow up visit. Seen for Dr. Marlou Porch.   He has a hx of diabetes, hypertension, hyperlipidemia, obesity, blindness, sleep apnea, angina, coronary artery disease with prior stenting to left circumflex. Noted prior medication noncompliance.   Cardiac cath 10/2007 with PCI and BMS to proximal left circumflex by Dr Leonia Reeves. Right coronary artery medically managed not favorable for PCI. Nuclear stress test August 2017 with no ischemia.   Hospitalization in August 2017 for fatigue and chest pain - echocardiogram as well as nuclear stress test were normal and reassuring. Ranexa was started. Last seen back in November and was felt to be doing ok.   Called here last month with chest pain - referred to the ER - admitted. Echo with EF of 60 to 65% with normal wall motion and mild diastolic dysfunction noted. Medicines were changed - Norvasc added - continued medical management recommended with plan to optimize first and if continued with symptoms to then proceed with cardiac cath. Noted that he had not been taking his medicines due to cost issues.   Comes in today. Here with his wife. Had lots of blood work this morning with PCP. He says his chest pain is better. Now he notes more falling asleep - just sits and then asleep - this is new. But he is taking his Gabapentin now - this may be the reason. He notes he hallucinates - this is chronic. Heart wise he feels like he is ok. Pretty sedentary. Lost his sight due to glaucoma.   Past Medical History:  Diagnosis Date  . Arthritis    "mild arthritis in hip"  . CAD (coronary artery  disease)   . CHF (congestive heart failure) ([place])   . Complication of anesthesia    "lungs filled up with fluid"   . Diabetes mellitus    Type II  . Glaucoma   . Heart murmur   . HTN (hypertension)   . Hypercholesterolemia   . LBP (low back pain)   . Legally blind   . Peripheral neuropathy   . Sleep apnea   . Staph infection    "from back surgery"  . Trigger finger   . Ulcer   . Wears glasses    "to protect cornea" - legally blind    Past Surgical History:  Procedure Laterality Date  . CHOLECYSTECTOMY  09/15/2012   Procedure: LAPAROSCOPIC CHOLECYSTECTOMY WITH INTRAOPERATIVE CHOLANGIOGRAM;  Surgeon: Pedro Earls, MD;  Location: [place];  Service: General;  Laterality: N/A;  . EYE SURGERY Bilateral    cataracts  . INGUINAL HERNIA REPAIR     right  . LUMBAR FUSION    . LUMBAR LAMINECTOMY/DECOMPRESSION MICRODISCECTOMY N/A 10/26/2015   Procedure: RIGHT AND CENTRAL LUMBAR LAMINECTOMY L3-4, RIGHT L5-S1 LATERAL RECESS DECOMPRESSION;  Surgeon: Jessy Oto, MD;  Location: [place];  Service: Orthopedics;  Laterality: N/A;  . NECK SURGERY    . peptic ulcer dz surgery  pt was in his 20s   bleeding ulcer.   . Prosthetic Cornea placement, Right eye  2007   [facility]  .  TRIGGER FINGER RELEASE Right 10/26/2015   Procedure: RELEASE TRIGGER FINGER RIGHT THUMB;  Surgeon: Jessy Oto, MD;  Location: [place];  Service: Orthopedics;  Laterality: Right;     Medications: Current Meds  Medication Sig  . amLODipine (NORVASC) 5 MG tablet Take 1 tablet (5 mg total) by mouth daily.  Marland Kitchen aspirin 81 MG EC tablet Take 1 tablet (81 mg total) by mouth daily.  Marland Kitchen atorvastatin (LIPITOR) 80 MG tablet Take 1 tablet (80 mg total) by mouth daily at 6 PM.  . Blood Glucose Calibration (ONETOUCH VERIO) SOLN 1 Act by In Vitro route 2 (two) times daily.  . Blood Glucose Monitoring Suppl (PRODIGY VOICE BLOOD GLUCOSE) W/DEVICE KIT Use to check blood sugars twice a day Dx e11.9  . carvedilol (COREG) 12.5 MG  tablet TAKE 1 TABLET BY MOUTH TWICE A DAY WITH A MEAL  . Continuous Blood Gluc Receiver (FREESTYLE LIBRE READER) DEVI 1 Act by Does not apply route 3 (three) times daily with meals.  . Continuous Blood Gluc Sensor (FREESTYLE LIBRE SENSOR SYSTEM) MISC 1 Act by Does not apply route 3 (three) times daily with meals.  . cyanocobalamin 2000 MCG tablet Take 1 tablet (2,000 mcg total) by mouth daily.  Marland Kitchen ezetimibe (ZETIA) 10 MG tablet Take 1 tablet (10 mg total) by mouth daily.  . fluticasone (FLONASE) 50 MCG/ACT nasal spray Place 2 sprays into both nostrils daily.  Marland Kitchen gabapentin (NEURONTIN) 300 MG capsule Take 300 mg by mouth daily.  Marland Kitchen glucose blood (ONETOUCH VERIO) test strip Use BID to check BS. DX E11.9  . HUMALOG MIX 75/25 (75-25) 100 UNIT/ML SUSP injection INJECT 85 UNITS IN THE MORNING AND 45 UNITS IN [facility] EVENING  . isosorbide mononitrate (IMDUR) 120 MG 24 hr tablet TAKE 1 TABLET (120 MG TOTAL) BY MOUTH DAILY.  Marland Kitchen JANUMET 50-1000 MG tablet TAKE 1 TABLET BY MOUTH TWICE A DAY WITH A MEAL  . levocetirizine (XYZAL) 5 MG tablet Take 1 tablet (5 mg total) by mouth every evening.  Marland Kitchen losartan (COZAAR) 50 MG tablet Take 1 tablet (50 mg total) by mouth daily.  . nitroGLYCERIN (NITROSTAT) 0.4 MG SL tablet Place 1 tablet (0.4 mg total) under the tongue every 5 (five) minutes as needed for chest pain.  Marland Kitchen omega-3 acid ethyl esters (LOVAZA) 1 g capsule Take 2 capsules (2 g total) by mouth 2 (two) times daily.  . pantoprazole (PROTONIX) 40 MG tablet Take 1 tablet (40 mg total) by mouth 2 (two) times daily.  Marland Kitchen PRODIGY LANCETS 28G MISC Use to check blood sugars twice a day Dx e11.9  . ranolazine (RANEXA) 500 MG 12 hr tablet Take 1 tablet (500 mg total) by mouth 2 (two) times daily.  . tamsulosin (FLOMAX) 0.4 MG CAPS capsule Take 1 capsule (0.4 mg total) by mouth daily.  . traMADol (ULTRAM) 50 MG tablet Take 1 tablet (50 mg total) by mouth every 8 (eight) hours as needed.  . [DISCONTINUED] amLODipine (NORVASC) 5 MG  tablet Take 5 mg by mouth daily.  . [DISCONTINUED] carvedilol (COREG) 12.5 MG tablet TAKE 1 TABLET BY MOUTH TWICE A DAY WITH A MEAL (Patient taking differently: TAKE 12.5MG BY MOUTH TWICE A DAY WITH A MEAL)  . [DISCONTINUED] isosorbide mononitrate (IMDUR) 120 MG 24 hr tablet TAKE 1 TABLET (120 MG TOTAL) BY MOUTH DAILY.  . [DISCONTINUED] losartan (COZAAR) 50 MG tablet TAKE 1 TABLET BY MOUTH DAILY (Patient taking differently: TAKE 50MG BY MOUTH DAILY)     Allergies: Allergies  Allergen  Reactions  . Furosemide     pancreatitis  . Diamox [Acetazolamide] Itching  . Lisinopril Rash    Social History: The patient  reports that he has never smoked. He has never used smokeless tobacco. He reports that he does not drink alcohol or use drugs.   Family History: The patient's family history includes Breast cancer in his mother; Colon cancer in his mother and other; Diabetes in his brother, mother, and sister; Hypertension in his father and mother.   Review of Systems: Please see the history of present illness.   Otherwise, the review of systems is positive for none.   All other systems are reviewed and negative.   Physical Exam: VS:  BP (!) 110/50 (BP Location: Left Arm, Patient Position: Sitting, Cuff Size: Large)   Pulse 79   Ht 5' 11"  (1.803 m)   Wt 244 lb (110.7 kg)   SpO2 96% Comment: at rest  BMI 34.03 kg/m  .  BMI Body mass index is 34.03 kg/m.  Wt Readings from Last 3 Encounters:  07/22/17 244 lb (110.7 kg)  07/22/17 232 lb (105.2 kg)  07/10/17 223 lb (101.2 kg)    General: Pleasant. Well developed, well nourished and in no acute distress.   HEENT: Normal but he is blind.   Neck: Supple, no JVD, carotid bruits, or masses noted.  Cardiac: Regular rate and rhythm. No murmurs, rubs, or gallops. No edema.  Respiratory:  Lungs are clear to auscultation bilaterally with normal work of breathing.  GI: Soft and nontender.  MS: No deformity or atrophy. Gait and ROM intact. Using a  cane.   Skin: Warm and dry. Color is normal.  Neuro:  Strength and sensation are intact and no gross focal deficits noted.  Psych: Alert, appropriate and with normal affect.   LABORATORY DATA:  EKG:  EKG is not ordered today.  Lab Results  Component Value Date   WBC 4.3 07/09/2017   HGB 12.1 (L) 07/09/2017   HCT 36.6 (L) 07/09/2017   PLT 160 07/09/2017   GLUCOSE 175 (H) 07/09/2017   CHOL 191 07/10/2017   TRIG 172 (H) 07/10/2017   HDL 24 (L) 07/10/2017   LDLDIRECT 82.7 04/04/2013   LDLCALC 133 (H) 07/10/2017   ALT 12 12/09/2016   AST 12 12/09/2016   NA 137 07/09/2017   K 4.3 07/09/2017   CL 105 07/09/2017   CREATININE 1.03 07/09/2017   BUN 10 07/09/2017   CO2 25 07/09/2017   TSH 1.50 04/09/2016   PSA 1.99 04/09/2016   INR 0.95 08/17/2010   HGBA1C 7.0 (H) 07/09/2017   MICROALBUR <0.7 04/09/2016     BNP (last 3 results)  Recent Labs  07/27/16 0630 07/09/17 1609  BNP 4.1 3.8    ProBNP (last 3 results) No results for input(s): PROBNP in the last 8760 hours.   Other Studies Reviewed Today:  ECHO Study Conclusions 06/2017  - Left ventricle: The cavity size was normal. There was severe   concentric hypertrophy. Systolic function was normal. The   estimated ejection fraction was in the range of 60% to 65%. Wall   motion was normal; there were no regional wall motion   abnormalities. Doppler parameters are consistent with abnormal   left ventricular relaxation (grade 1 diastolic dysfunction).   Doppler parameters are consistent with elevated ventricular   end-diastolic filling pressure. - Aortic valve: Trileaflet; normal thickness leaflets. There was no   regurgitation. - Aortic root: The aortic root was normal in size. - Mitral  valve: Structurally normal valve. There was mild   regurgitation. Valve area by pressure half-time: 1.29 cm^2. - Left atrium: The atrium was normal in size. - Right ventricle: Systolic function was normal. - Right atrium: The atrium  was normal in size. - Tricuspid valve: There was no regurgitation. - Pulmonary arteries: Systolic pressure was within the normal   range. - Inferior vena cava: The vessel was normal in size. - Pericardium, extracardiac: There was no pericardial effusion.  Nuclear stress test 07/27/16:  - Low risk, no ischemia, ejection fraction 56%   ASSESSMENT AND PLAN:  1. Known CAD with prior PCI to the LCX and OM and residual disease in the RCA and LAD treated medically - last stress test from 2017 was low risk and reassuring. Now with recent admission due to chest pain - had been non compliant with medicines due to cost. He is now back on his medicines - Norvasc has been added. No more chest pain. Would continue with medical management.   2. HTN - BP looks good on his current regimen.   3. HLD - on statin therapy  4. DM  5. Obesity   6. Blindness  Current medicines are reviewed with the patient today.  The patient does not have concerns regarding medicines other than what has been noted above.  The following changes have been made:  See above.  Labs/ tests ordered today include:   No orders of the defined types were placed in this encounter.    Disposition:   FU with Dr. Marlou Porch in November.   Patient is agreeable to this plan and will call if any problems develop in the interim.   SignedTruitt Merle, NP  07/22/2017 3:10 PM  [place] [address] [place],   [address] Phone: [phone] Fax: [phone]

## 2017-07-24 DIAGNOSIS — M5416 Radiculopathy, lumbar region: Secondary | ICD-10-CM | POA: Diagnosis not present

## 2017-07-24 DIAGNOSIS — R269 Unspecified abnormalities of gait and mobility: Secondary | ICD-10-CM | POA: Diagnosis not present

## 2017-07-24 DIAGNOSIS — E119 Type 2 diabetes mellitus without complications: Secondary | ICD-10-CM | POA: Diagnosis not present

## 2017-07-27 LAB — VITAMIN B1: VITAMIN B1 (THIAMINE): 9 nmol/L (ref 8–30)

## 2017-07-28 ENCOUNTER — Encounter: Payer: Self-pay | Admitting: Internal Medicine

## 2017-08-17 ENCOUNTER — Other Ambulatory Visit: Payer: Self-pay | Admitting: Internal Medicine

## 2017-08-24 DIAGNOSIS — E119 Type 2 diabetes mellitus without complications: Secondary | ICD-10-CM | POA: Diagnosis not present

## 2017-08-24 DIAGNOSIS — R269 Unspecified abnormalities of gait and mobility: Secondary | ICD-10-CM | POA: Diagnosis not present

## 2017-08-24 DIAGNOSIS — M5416 Radiculopathy, lumbar region: Secondary | ICD-10-CM | POA: Diagnosis not present

## 2017-09-15 ENCOUNTER — Ambulatory Visit: Payer: PPO | Admitting: Podiatry

## 2017-09-21 ENCOUNTER — Ambulatory Visit (INDEPENDENT_AMBULATORY_CARE_PROVIDER_SITE_OTHER): Payer: PPO | Admitting: Podiatry

## 2017-09-21 DIAGNOSIS — E1140 Type 2 diabetes mellitus with diabetic neuropathy, unspecified: Secondary | ICD-10-CM

## 2017-09-21 DIAGNOSIS — E1151 Type 2 diabetes mellitus with diabetic peripheral angiopathy without gangrene: Secondary | ICD-10-CM | POA: Diagnosis not present

## 2017-09-21 DIAGNOSIS — B351 Tinea unguium: Secondary | ICD-10-CM

## 2017-09-21 NOTE — Patient Instructions (Signed)

## 2017-09-22 ENCOUNTER — Encounter: Payer: Self-pay | Admitting: Podiatry

## 2017-09-22 NOTE — Progress Notes (Signed)
Patient ID: [name], male   DOB: September 06, 1951, 66 y.o.   MRN: [id number]     Subjective: This patient presents for scheduled visit complaining of thickened and elongated toenails and a half walking wearing shoes and requests nail debridement. He is a known type II diabetic and denies any history of ulceration or amputation States he has a pending dropfoot ordered by his physician for right his right lower extremity    Objective:  Orientated 3  Vascular: Bilateral peripheral pitting edema DP pulses 0/4 bilaterally PT pulses 0/4 bilaterally Capillary reflex within normal limits  Neurological: Sensation to 10 g monofilament wire intact 2/5 right and 3/5 left Vibratory sensation nonreactive bilaterally Ankle reflexes weakly reactive bilaterally  Dermatological: No open skin lesions noted bilaterally Dry skin bilaterally The toenails are elongated, brittle, discolored, hypertrophic 6-10  Musculoskeletal: No deformities noted bilaterally There is no restriction ankle, subtalar, midtarsal joints bilaterally Dorsi flexion right 3/5 and 5/5 left Plantar flexion right 4/5 right 5/5 left   Assessment: Diabetic peripheral arterial disease Diabetic peripheral neuropathy Mycotic toenails 6-10 Mild dropfoot deformity right with pending AFO ordered by another physician per patient  Plan: The toenails 6-10 were debrided mechanically and electrically without any bleeding  Reappoint 3 months

## 2017-09-23 DIAGNOSIS — R269 Unspecified abnormalities of gait and mobility: Secondary | ICD-10-CM | POA: Diagnosis not present

## 2017-09-23 DIAGNOSIS — M5416 Radiculopathy, lumbar region: Secondary | ICD-10-CM | POA: Diagnosis not present

## 2017-09-23 DIAGNOSIS — E119 Type 2 diabetes mellitus without complications: Secondary | ICD-10-CM | POA: Diagnosis not present

## 2017-09-27 ENCOUNTER — Encounter: Payer: Self-pay | Admitting: Diagnostic Neuroimaging

## 2017-10-05 ENCOUNTER — Other Ambulatory Visit: Payer: Self-pay | Admitting: Internal Medicine

## 2017-10-05 DIAGNOSIS — E118 Type 2 diabetes mellitus with unspecified complications: Secondary | ICD-10-CM

## 2017-10-05 DIAGNOSIS — Z794 Long term (current) use of insulin: Secondary | ICD-10-CM

## 2017-10-06 ENCOUNTER — Ambulatory Visit (INDEPENDENT_AMBULATORY_CARE_PROVIDER_SITE_OTHER): Payer: PPO | Admitting: Diagnostic Neuroimaging

## 2017-10-06 ENCOUNTER — Encounter: Payer: Self-pay | Admitting: Diagnostic Neuroimaging

## 2017-10-06 VITALS — BP 124/71 | HR 65 | Wt 240.0 lb

## 2017-10-06 DIAGNOSIS — R413 Other amnesia: Secondary | ICD-10-CM | POA: Diagnosis not present

## 2017-10-06 DIAGNOSIS — R251 Tremor, unspecified: Secondary | ICD-10-CM | POA: Diagnosis not present

## 2017-10-06 DIAGNOSIS — R252 Cramp and spasm: Secondary | ICD-10-CM

## 2017-10-06 NOTE — Progress Notes (Signed)
[facility]  PATIENT: [name] DOB: [birth year]  REFERRING CLINICIAN: Alona Bene, MD HISTORY FROM: patient  REASON FOR VISIT: new consult / existing patient   HISTORICAL  CHIEF COMPLAINT:  Chief Complaint  Patient presents with  . Tremors    rm 7, wife- [name], "my hands start shaking, my hands and arms will feel rubbery; sometimes my left hand will draw up and I have to straighten it out with my other hand; my feet feel like cardboard sometimes"    HISTORY OF PRESENT ILLNESS:   UPDATE (10/06/17, VRP): Since last visit, doing fair. More issues with left hand tremor and spasms. Intermittent spasms, but able to stop with voluntary effort. Happens few times per week, minutes at a time. Has started ~ 6 months ago. Gait and balance still poor. Some more short term memory loss issues. Not that active. Stays home a lot. No alleviating or aggravating factors.   PRIOR HPI (09/12/16): 66 year old male here for evaluation of lower extremity numbness him a low back pain, right leg pain. Patient has hypertension, diabetes, hyperglycemia, heart disease. Patient has had 2 lumbar spine surgeries (2002 and 2016) and cervical spine surgery in 2002. 1998 patient was diagnosed with diabetes. Around this time he was having severe eye problems ultimately leading to bilateral blindness. In early 2000 patient had gradual onset and progressive low back pain. Ultimately this continued in spite of conservative management, and resulted in patient opting for lumbar spine surgery. Unfortunately when this occurred in 2542, this was complicated by osteomyelitis including cervical epidural abscess. This required additional treatment and surgery. Ever since that time patient has had difficulty with his balance and walking. He had to "learn how to walk" again. Over time patient's low back pain returned, and patient underwent another lumbar spine surgery on 10/26/2015. Following surgery patient's lower  extremities, balance and walking significantly worsened. Patient had follow-up MRI in June 2017 which showed some progression of degenerative spine disease. Patient was referred to me for EMG nerve conduction study by his orthopedic surgeon, and the study showed severe peripheral neuropathy without definite electrodiagnostic evidence of lumbar radiculopathy, but study was limited due to poor relaxation and lumbar paraspinal evaluation. Over the years patient has diabetes has been improving. In retrospect he has had some numbness and pain in his hands for number of years as well as numbness in his feet for past 3 years. Patient referred to me by PCP for further evaluation. Patient denies any burning, pins and needles pain in his toes or feet. He has a "cardboard" sensation in his feet. He has some low back pain which is intermittent, worse when he stands up and tries to walk.    REVIEW OF SYSTEMS: Full 14 system review of systems performed and negative with exception of: As per history of present illness.  ALLERGIES: Allergies  Allergen Reactions  . Furosemide     pancreatitis  . Diamox [Acetazolamide] Itching    hives  . Lisinopril Rash    HOME MEDICATIONS: Outpatient Medications Prior to Visit  Medication Sig Dispense Refill  . amLODipine (NORVASC) 5 MG tablet Take 1 tablet (5 mg total) by mouth daily. 90 tablet 3  . aspirin 81 MG EC tablet Take 1 tablet (81 mg total) by mouth daily. 90 tablet 1  . atorvastatin (LIPITOR) 80 MG tablet Take 1 tablet (80 mg total) by mouth daily at 6 PM. 90 tablet 1  . carvedilol (COREG) 12.5 MG tablet TAKE 1 TABLET BY MOUTH  TWICE A DAY WITH A MEAL 180 tablet 3  . cyanocobalamin 2000 MCG tablet Take 1 tablet (2,000 mcg total) by mouth daily. 90 tablet 3  . ezetimibe (ZETIA) 10 MG tablet Take 1 tablet (10 mg total) by mouth daily. 90 tablet 3  . fluticasone (FLONASE) 50 MCG/ACT nasal spray Place 2 sprays into both nostrils daily. 48 g 3  . gabapentin  (NEURONTIN) 300 MG capsule Take 300 mg by mouth daily.    Marland Kitchen glucose blood (ONETOUCH VERIO) test strip Use BID to check BS. DX E11.9 100 each 12  . HUMALOG MIX 75/25 (75-25) 100 UNIT/ML SUSP injection INJECT 85 UNITS IN THE MORNING AND 45 UNITS IN [facility] EVENING 100 mL 6  . isosorbide mononitrate (IMDUR) 120 MG 24 hr tablet TAKE 1 TABLET (120 MG TOTAL) BY MOUTH DAILY. 90 tablet 3  . JANUMET 50-1000 MG tablet TAKE 1 TABLET BY MOUTH TWICE A DAY WITH A MEAL 180 tablet 1  . levocetirizine (XYZAL) 5 MG tablet Take 1 tablet (5 mg total) by mouth every evening. 90 tablet 3  . losartan (COZAAR) 50 MG tablet Take 1 tablet (50 mg total) by mouth daily. 90 tablet 3  . nitroGLYCERIN (NITROSTAT) 0.4 MG SL tablet Place 1 tablet (0.4 mg total) under the tongue every 5 (five) minutes as needed for chest pain. 25 tablet 3  . omega-3 acid ethyl esters (LOVAZA) 1 g capsule Take 2 capsules (2 g total) by mouth 2 (two) times daily. 360 capsule 2  . pantoprazole (PROTONIX) 40 MG tablet Take 1 tablet (40 mg total) by mouth 2 (two) times daily. 180 tablet 3  . PRODIGY LANCETS 28G MISC Use to check blood sugars twice a day Dx e11.9 100 each 3  . RANEXA 500 MG 12 hr tablet TAKE 1 TABLET TWICE A DAY 180 tablet 1  . tamsulosin (FLOMAX) 0.4 MG CAPS capsule Take 1 capsule (0.4 mg total) by mouth daily. 15 capsule 1  . traMADol (ULTRAM) 50 MG tablet Take 1 tablet (50 mg total) by mouth every 8 (eight) hours as needed. 270 tablet 3  . Blood Glucose Calibration (ONETOUCH VERIO) SOLN 1 Act by In Vitro route 2 (two) times daily. (Patient not taking: Reported on 10/06/2017) 1 each 11  . Blood Glucose Monitoring Suppl (PRODIGY VOICE BLOOD GLUCOSE) W/DEVICE KIT Use to check blood sugars twice a day Dx e11.9 (Patient not taking: Reported on 10/06/2017) 1 kit 0  . Continuous Blood Gluc Receiver (FREESTYLE LIBRE READER) DEVI 1 Act by Does not apply route 3 (three) times daily with meals. (Patient not taking: Reported on 10/06/2017) 1 Device  1  . Continuous Blood Gluc Sensor (FREESTYLE LIBRE SENSOR SYSTEM) MISC 1 ACT BY DOES NOT APPLY ROUTE 3 (THREE) TIMES DAILY WITH MEALS. (Patient not taking: Reported on 10/06/2017) 1 each 1   No facility-administered medications prior to visit.     PAST MEDICAL HISTORY: Past Medical History:  Diagnosis Date  . Arthritis    "mild arthritis in hip"  . CAD (coronary artery disease)   . CHF (congestive heart failure) ([place])   . Complication of anesthesia    "lungs filled up with fluid"   . Diabetes mellitus    Type II  . Glaucoma   . Heart murmur   . HTN (hypertension)   . Hypercholesterolemia   . LBP (low back pain)   . Legally blind   . Peripheral neuropathy   . Sleep apnea   . Staph infection    "  from back surgery"  . Trigger finger   . Ulcer   . Wears glasses    "to protect cornea" - legally blind    PAST SURGICAL HISTORY: Past Surgical History:  Procedure Laterality Date  . CHOLECYSTECTOMY  09/15/2012   Procedure: LAPAROSCOPIC CHOLECYSTECTOMY WITH INTRAOPERATIVE CHOLANGIOGRAM;  Surgeon: Pedro Earls, MD;  Location: [place];  Service: General;  Laterality: N/A;  . EYE SURGERY Bilateral    cataracts  . INGUINAL HERNIA REPAIR     right  . LUMBAR FUSION    . LUMBAR LAMINECTOMY/DECOMPRESSION MICRODISCECTOMY N/A 10/26/2015   Procedure: RIGHT AND CENTRAL LUMBAR LAMINECTOMY L3-4, RIGHT L5-S1 LATERAL RECESS DECOMPRESSION;  Surgeon: Jessy Oto, MD;  Location: [place];  Service: Orthopedics;  Laterality: N/A;  . NECK SURGERY    . peptic ulcer dz surgery  pt was in his 20s   bleeding ulcer.   . Prosthetic Cornea placement, Right eye  2007   [place] Right 10/26/2015   Procedure: RELEASE TRIGGER FINGER RIGHT THUMB;  Surgeon: Jessy Oto, MD;  Location: [place];  Service: Orthopedics;  Laterality: Right;    FAMILY HISTORY: Family History  Problem Relation Age of Onset  . Breast cancer Mother   . Colon cancer Mother   . Hypertension Mother     . Diabetes Mother   . Hypertension Father   . Colon cancer Other        Elevated Risk for  . Diabetes Sister   . Diabetes Brother     SOCIAL HISTORY:  Social History   Social History  . Marital status: Married    Spouse name: N/A  . Number of children: 3  . Years of education: 12   Occupational History  . disabled     blind   Social History Main Topics  . Smoking status: Never Smoker  . Smokeless tobacco: Never Used  . Alcohol use No  . Drug use: No  . Sexual activity: Not Currently   Other Topics Concern  . Not on file   Social History Narrative   Occupation: disabled, blind   Married   Regular Exercise-no   Lives at home with his wife.   Right-handed.   2-3 cups caffeine per day.        PHYSICAL EXAM  GENERAL EXAM/CONSTITUTIONAL: Vitals:  Vitals:   10/06/17 1453  BP: 124/71  Pulse: 65  Weight: 240 lb (108.9 kg)   Body mass index is 33.47 kg/m. No exam data present  Patient is in no distress; well developed, nourished and groomed; neck is supple  CARDIOVASCULAR:  Examination of carotid arteries is normal; no carotid bruits  Regular rate and rhythm, no murmurs  Examination of peripheral vascular system by observation and palpation is normal  EYES:  RIGHT EYE SHRUNKEN AND POST-SURGICAL (BLIND)  LEFT EYE HAS MINIMAL LIGHT PERCEPTION, NO PUPILLARY REACTION  MUSCULOSKELETAL:  Gait, strength, tone, movements noted in Neurologic exam below  NEUROLOGIC: MENTAL STATUS:  No flowsheet data found.  awake, alert, oriented to person, place and time  recent and remote memory intact  normal attention and concentration  language fluent, comprehension intact, naming intact,   fund of knowledge appropriate  CRANIAL NERVE:   2nd - SEE ABOVE EYE EXAM; POST-SURGICAL  2nd, 3rd, 4th, 6th - pupils equal and reactive to light, visual fields full to confrontation, extraocular muscles intact, no nystagmus  5th - facial sensation symmetric  7th  - facial strength symmetric  8th -  hearing intact  9th - palate elevates symmetrically, uvula midline  11th - shoulder shrug symmetric  12th - tongue protrusion midline  MOTOR:   normal bulk and tone, full strength in the BUE EXCEPT BILATERAL TRICEPS, FINGER ABDUCTION, FINGER FLEXION (3-4) AND BLE (HF 3-4, KE/KF 4, RIGHT DF 1, LEFT DF 3)  SENSORY:   normal and symmetric to light touch; ABSENT VIB AT TOES AND ANKLES  COORDINATION:   fine finger movements normal  REFLEXES:   deep tendon reflexes TRACE IN BUE; ABSENT IN BLE  GAIT/STATION:   USES SINGLE POINT CANE; SLOW TO RISE; SHORT STEPS; VERY UNSTEADY    DIAGNOSTIC DATA (LABS, IMAGING, TESTING) - I reviewed patient records, labs, notes, testing and imaging myself where available.  Lab Results  Component Value Date   WBC 6.1 07/22/2017   HGB 11.7 (L) 07/22/2017   HCT 37.0 (L) 07/22/2017   MCV 73.2 (L) 07/22/2017   PLT 212.0 07/22/2017      Component Value Date/Time   NA 137 07/09/2017 1609   K 4.3 07/09/2017 1609   CL 105 07/09/2017 1609   CO2 25 07/09/2017 1609   GLUCOSE 175 (H) 07/09/2017 1609   BUN 10 07/09/2017 1609   CREATININE 1.03 07/09/2017 2033   CALCIUM 9.0 07/09/2017 1609   PROT 7.3 12/09/2016 1129   ALBUMIN 4.1 12/09/2016 1129   AST 12 12/09/2016 1129   ALT 12 12/09/2016 1129   ALKPHOS 60 12/09/2016 1129   BILITOT 0.6 12/09/2016 1129   GFRNONAA >60 07/09/2017 2033   GFRAA >60 07/09/2017 2033   Lab Results  Component Value Date   CHOL 191 07/10/2017   HDL 24 (L) 07/10/2017   LDLCALC 133 (H) 07/10/2017   LDLDIRECT 82.7 04/04/2013   TRIG 172 (H) 07/10/2017   CHOLHDL 8.0 07/10/2017   Lab Results  Component Value Date   HGBA1C 7.0 (H) 07/09/2017   Lab Results  Component Value Date   JOITGPQD82 641 07/22/2017   Lab Results  Component Value Date   TSH 1.50 04/09/2016   04/04/01 MRI lumbar spine 1.    BROAD BASED PROTRUSION AT L4-5 MORE PRONOUNCED TOWARDS THE RIGHT WHICH IN  COMBINATION WITH FACET DEGENERATIVE CHANGE RESULTS IN RIGHT FORAMINAL AND LATERAL RECESS STENOSIS AT L4-5.  THIS MAY WELL BE THE CLINICALLY RELEVANT ABNORMALITY IN THIS PATIENT AND COULD EXPLAIN RIGHT LEG RADICULAR SYMPTOMATOLOGY. 2.    LEFT FORAMINAL STENOSIS AT L5-S1 LARGELY DUE TO DEGENERATIVE FACET DISEASE BUT ALSO CONTRIBUTED TO BY BULGING DISK MATERIAL.  THIS COULD AFFECT THE LEFT L5 NERVE ROOT.  08/08/01 MRI lumbar spine 1.  EVIDENCE OF POSTOPERATIVE INFECTION INVOLVING THE OPERATIVE APPROACH AND INVOLVING THE DEEP SPACES WITH EPIDURAL INFLAMMATORY CHANGES EXTENDING FROM L3TO S1.  THE EPIDURAL INFLAMMATORY CHANGES ARE REALLY QUITE PROMINENT AND SHOW COMPRESSIVE EFFECT UPON THE THECAL [place].  THERE ARE NOT LARGE NONENHANCING COMPONENTS TO SUGGEST PUS FILLED ABSCESSES BUT THIS IS PROBABLY DIFFUSE PHLEGMONOUS INFLAMMATION. TWO VIEW LUMBAR SPINE THE PATIENT HAS HAD HEMILAMINECTOMY ON THE RIGHT AT L4-5.  THERE IS SCOLIOSIS CONVEX TO THE LEFT. I DO NOT SEE BONY DESTRUCTIVE CHANGES ASSOCIATED WITH INFECTION.  09/04/01 MRI cervical spine 1.  STATUS POST DECOMPRESSIVE LAMINECTOMY FROM C3 THROUGH C6 WITH IMPROVED INTERSPINAL EPIDURAL ABSCESS. 2.  EXTENSIVE DORSAL SPINOUS INFLAMMATORY CHANGE WORRISOME FOR A SOFT TISSUE ABSCESS/MYOSITIS/PHLEGMON MEASURING APPROXIMATELY 2 X 5 CM IN CROSS SECTION AND EXTENDING OVER A CEPHALOCAUDAL LENGTH OF 4 CM. 3.  STABLE PARACENTRAL DISC PROTRUSION C5-6 CENTRAL AND TO THE LEFT AND STABLE SMALL  SOFT CENTRAL DISC PROTRUSION AT C6-7.  11/12/01 MRI cervical spine - NEGATIVE FOR CERVICAL EPIDURAL ABSCESS.  STATUS POST DECOMPRESSIVE LAMINECTOMY. DISC DEGENERATION IS SIMILAR TO THE PRIOR STUDY.   06/01/16 MRI lumbar spine [I reviewed images myself and agree with interpretation. -VRP]  1. Lumbar spondylosis and degenerative disc disease causing prominent impingement at L5-S1; moderate impingement at L3-4; and mild impingement at L2-3, as detailed above. Postoperative findings at  multiple levels. Some of the right-sided findings at L3-4 are improved compared to the prior exam due to interval laminectomy and partial facetectomy. Findings at L2- 3 and L5-S1 are worsened from prior.  06/01/16 MRI brain [I reviewed images myself and agree with interpretation. -VRP]  1. No acute intracranial abnormality. 2. Minimal chronic small vessel ischemic disease.  08/01/16 EMG/NCS 1. Severe axonal sensorimotor polyneuropathy with active and chronic denervation. 2. Superimposed bilateral L5-S1 lumbar radiculopathies is possible given the clinical context and abnormal EMG findings in the lower extremities. Although no definite lumbar paraspinal muscle spontaneous activity was noted, the absence of this activity does not totally exclude lumbar radiculopathy. In addition, even if abnormal lumbar spine spontaneous activity were to be seen, in the setting of prior lumbar surgery this would not necessarily confirm ongoing lumbar radiculopathy problems.     ASSESSMENT AND PLAN  66 y.o. year old male here with long history of diabetes, status post bilateral blindness, with progressive lower extremity weakness, numbness, pain, low back pain. Overall patient's lower extremities are affected by severe diabetic peripheral neuropathy as well as concomitant lumbar degenerative spine disease and lumbar polyradiculopathies, status post 2 lumbar spine surgeries.   Dx:  1. Tremor of left hand   2. Hand or foot spasms   3. Memory loss      PLAN:  LEFT HAND TREMOR / SPASMS - check MRI brain and EEG (rule out stroke and seizure)  MEMORY LOSS - likely due to pain, insomnia, inactivity, social withdrawal - encouraged brain healthy activities  DIABETIC NEUROPATHY - continue diabetes treatment - continue gabapentin for neuropathic pain treatment  LUMBAR RADICULOPATHY / LOW BACK PAIN - follow up with Dr. Louanne Skye or pain mgmt referral for low back pain / lumbar radiculopathy issues, if patient  needs long term narcotic medications  Orders Placed This Encounter  Procedures  . MR BRAIN WO CONTRAST  . EEG adult   Return in about 4 months (around 02/06/2018).    Penni Bombard, MD 63/94/3200, 3:79 PM Certified in Neurology, Neurophysiology and Neuroimaging  [facility] [address] [place], [place] [address] [phone]

## 2017-10-22 ENCOUNTER — Ambulatory Visit (INDEPENDENT_AMBULATORY_CARE_PROVIDER_SITE_OTHER): Payer: PPO

## 2017-10-22 DIAGNOSIS — R251 Tremor, unspecified: Secondary | ICD-10-CM

## 2017-10-22 DIAGNOSIS — R413 Other amnesia: Secondary | ICD-10-CM

## 2017-10-22 DIAGNOSIS — R252 Cramp and spasm: Secondary | ICD-10-CM | POA: Diagnosis not present

## 2017-10-24 DIAGNOSIS — M5416 Radiculopathy, lumbar region: Secondary | ICD-10-CM | POA: Diagnosis not present

## 2017-10-24 DIAGNOSIS — R269 Unspecified abnormalities of gait and mobility: Secondary | ICD-10-CM | POA: Diagnosis not present

## 2017-10-24 DIAGNOSIS — E119 Type 2 diabetes mellitus without complications: Secondary | ICD-10-CM | POA: Diagnosis not present

## 2017-10-29 ENCOUNTER — Ambulatory Visit
Admission: RE | Admit: 2017-10-29 | Discharge: 2017-10-29 | Disposition: A | Discharge status: Home / Self Care | Payer: PPO | Source: Ambulatory Visit | Attending: Diagnostic Neuroimaging | Admitting: Diagnostic Neuroimaging

## 2017-10-29 DIAGNOSIS — R413 Other amnesia: Secondary | ICD-10-CM

## 2017-10-29 NOTE — Procedures (Signed)
   [facility]  EEG (ELECTROENCEPHALOGRAM) REPORT   STUDY DATE: 10/22/17 PATIENT NAME: [name] DOB: 1951/03/27 MRN: [id number]  ORDERING CLINICIAN: Andrey Spearman, MD   TECHNOLOGIST: Laretta Alstrom  TECHNIQUE: Electroencephalogram was recorded utilizing standard 10-20 system of lead placement and reformatted into average and bipolar montages.  RECORDING TIME: 22 minutes  ACTIVATION: photic stimulation  CLINICAL INFORMATION: 66 year old male with intermittent muscle spasms.  FINDINGS: Posterior dominant background rhythms, which attenuate with eye opening, ranging 8-9 hertz and 10-15 microvolts. No focal, lateralizing, epileptiform activity or seizures are seen. Patient recorded in the awake and drowsy state. EKG channel shows regular rhythm of 70-75 beats per minute.   IMPRESSION:  Normal EEG in the awake and drowsy states.    INTERPRETING PHYSICIAN:  Penni Bombard, MD Certified in Neurology, Neurophysiology and Neuroimaging  [facility] [address] [place],  [address] [phone]

## 2017-11-02 ENCOUNTER — Telehealth: Payer: Self-pay | Admitting: *Deleted

## 2017-11-02 DIAGNOSIS — G4733 Obstructive sleep apnea (adult) (pediatric): Secondary | ICD-10-CM

## 2017-11-02 NOTE — Telephone Encounter (Signed)
Spoke with patient and informed him that his EEG was normal. Informed him his MRI brain result was unremarkable. He then asked about an appointment for sleep apnea. He stated when he had EEG done he talked to the technician. The technician stated she would let Dr Leta Baptist know he wanted an appointment. This RN advised will send his request to Dr Leta Baptist. After referral is placed, he'll get a call from this office to schedule.  Patient verbalized understanding, appreciation.

## 2017-11-03 NOTE — Telephone Encounter (Signed)
Called patient to discuss. He stated that he snores and years ago he had a sleep study and was advised to get a machine. He stated he didn't have insurance, so he never got it. He stated his mother used to tell him she was afraid for him when he was sleeping. He reported snoring for many years and daytime sleepiness. He stated he would like to have another study. This RN stated will let Dr Leta Baptist know. He verbalized understanding, appreciation.

## 2017-11-03 NOTE — Telephone Encounter (Signed)
Spoke to pt and wife and relayed that order was placed for a sleep consult.  The referral dept will call (i was unsure when he would be seen),  Will send message to referral dept.  I relayed will see sleep MD first then they would evaluate and order appropriate testing for him.  She verbalized understanding.

## 2017-11-03 NOTE — Addendum Note (Signed)
Addended by: Andrey Spearman R on: 11/03/2017 03:36 PM   Modules accepted: Orders

## 2017-11-04 ENCOUNTER — Encounter: Payer: Self-pay | Admitting: Cardiology

## 2017-11-04 ENCOUNTER — Encounter: Payer: Self-pay | Admitting: Internal Medicine

## 2017-11-04 ENCOUNTER — Ambulatory Visit (INDEPENDENT_AMBULATORY_CARE_PROVIDER_SITE_OTHER): Payer: PPO | Admitting: Cardiology

## 2017-11-04 VITALS — BP 118/48 | HR 62 | Resp 16 | Ht 71.0 in | Wt 248.0 lb

## 2017-11-04 DIAGNOSIS — Z23 Encounter for immunization: Secondary | ICD-10-CM

## 2017-11-04 DIAGNOSIS — E7800 Pure hypercholesterolemia, unspecified: Secondary | ICD-10-CM | POA: Diagnosis not present

## 2017-11-04 DIAGNOSIS — I2510 Atherosclerotic heart disease of native coronary artery without angina pectoris: Secondary | ICD-10-CM

## 2017-11-04 DIAGNOSIS — H547 Unspecified visual loss: Secondary | ICD-10-CM

## 2017-11-04 NOTE — Telephone Encounter (Signed)
Booking new consults in January for sleep per Diane.  Sent message to sheena in sleep lab if cancellation to call them.

## 2017-11-04 NOTE — Progress Notes (Signed)
[facility] [address], [address], [place]  [address] Phone: [phone] Fax:  [phone]  Date:  11/04/2017   ID:  [name], [place] 1951/06/04, MRN [id number]  PCP:  [name], MD   History of Present Illness: [name] is a 66 y.o. male male with a hx of diabetes, hypertension, hyperlipidemia, obesity, blindness, sleep apnea, angina, coronary artery disease with prior stenting to left circumflex.  Cardiac cath 10/2007 with PCI and BMS to proximal left circumflex by Dr Leonia Reeves. Right coronary artery medically managed not favorable for PCI.   Nuclear stress test August 2017 no ischemia.   Hospitalization in August 2017 for fatigue, chest pain, echocardiogram as well as nuclear stress test were normal, low risk. Ranexa was started.   Currently he is doing very well. No complaints, no chest pain. He has been able to maintain his weight loss from 290 pounds to 230 pounds.  11/04/17-overall doing very well, no new complaints.  No chest pain, no anginal symptoms.  Mild shortness of breath with activity.  No syncope, no bleeding.    Wt Readings from Last 3 Encounters:  11/04/17 248 lb (112.5 kg)  10/06/17 240 lb (108.9 kg)  07/22/17 244 lb (110.7 kg)     Past Medical History:  Diagnosis Date  . Arthritis    "mild arthritis in hip"  . CAD (coronary artery disease)   . CHF (congestive heart failure) ([place])   . Complication of anesthesia    "lungs filled up with fluid"   . Diabetes mellitus    Type II  . Glaucoma   . Heart murmur   . HTN (hypertension)   . Hypercholesterolemia   . LBP (low back pain)   . Legally blind   . Peripheral neuropathy   . Sleep apnea   . Staph infection    "from back surgery"  . Trigger finger   . Ulcer   . Wears glasses    "to protect cornea" - legally blind    Past Surgical History:  Procedure Laterality Date  . EYE SURGERY Bilateral    cataracts  . INGUINAL HERNIA REPAIR     right  . LUMBAR FUSION    . NECK  SURGERY    . peptic ulcer dz surgery  pt was in his 20s   bleeding ulcer.   . Prosthetic Cornea placement, Right eye  2007   [facility]    Current Outpatient Medications  Medication Sig Dispense Refill  . amLODipine (NORVASC) 5 MG tablet Take 1 tablet (5 mg total) by mouth daily. 90 tablet 3  . aspirin 81 MG EC tablet Take 1 tablet (81 mg total) by mouth daily. 90 tablet 1  . atorvastatin (LIPITOR) 80 MG tablet Take 1 tablet (80 mg total) by mouth daily at 6 PM. 90 tablet 1  . Blood Glucose Calibration (ONETOUCH VERIO) SOLN 1 Act by In Vitro route 2 (two) times daily. 1 each 11  . Blood Glucose Monitoring Suppl (PRODIGY VOICE BLOOD GLUCOSE) W/DEVICE KIT Use to check blood sugars twice a day Dx e11.9 1 kit 0  . carvedilol (COREG) 12.5 MG tablet TAKE 1 TABLET BY MOUTH TWICE A DAY WITH A MEAL 180 tablet 3  . Continuous Blood Gluc Receiver (FREESTYLE LIBRE READER) DEVI 1 Act by Does not apply route 3 (three) times daily with meals. 1 Device 1  . Continuous Blood Gluc Sensor (FREESTYLE LIBRE SENSOR SYSTEM) MISC 1 ACT BY DOES NOT  APPLY ROUTE 3 (THREE) TIMES DAILY WITH MEALS. 1 each 1  . cyanocobalamin 2000 MCG tablet Take 1 tablet (2,000 mcg total) by mouth daily. 90 tablet 3  . ezetimibe (ZETIA) 10 MG tablet Take 1 tablet (10 mg total) by mouth daily. 90 tablet 3  . fluticasone (FLONASE) 50 MCG/ACT nasal spray Place 2 sprays into both nostrils daily. 48 g 3  . gabapentin (NEURONTIN) 300 MG capsule Take 300 mg by mouth daily.    Marland Kitchen glucose blood (ONETOUCH VERIO) test strip Use BID to check BS. DX E11.9 100 each 12  . HUMALOG MIX 75/25 (75-25) 100 UNIT/ML SUSP injection INJECT 85 UNITS IN THE MORNING AND 45 UNITS IN [facility] EVENING 100 mL 6  . isosorbide mononitrate (IMDUR) 120 MG 24 hr tablet TAKE 1 TABLET (120 MG TOTAL) BY MOUTH DAILY. 90 tablet 3  . JANUMET 50-1000 MG tablet TAKE 1 TABLET BY MOUTH TWICE A DAY WITH A MEAL 180 tablet 1  . levocetirizine (XYZAL) 5 MG tablet Take 1 tablet (5 mg  total) by mouth every evening. 90 tablet 3  . losartan (COZAAR) 50 MG tablet Take 1 tablet (50 mg total) by mouth daily. 90 tablet 3  . omega-3 acid ethyl esters (LOVAZA) 1 g capsule Take 2 capsules (2 g total) by mouth 2 (two) times daily. 360 capsule 2  . pantoprazole (PROTONIX) 40 MG tablet Take 1 tablet (40 mg total) by mouth 2 (two) times daily. 180 tablet 3  . PRODIGY LANCETS 28G MISC Use to check blood sugars twice a day Dx e11.9 100 each 3  . RANEXA 500 MG 12 hr tablet TAKE 1 TABLET TWICE A DAY 180 tablet 1  . tamsulosin (FLOMAX) 0.4 MG CAPS capsule Take 1 capsule (0.4 mg total) by mouth daily. 15 capsule 1  . traMADol (ULTRAM) 50 MG tablet Take 1 tablet (50 mg total) by mouth every 8 (eight) hours as needed. 270 tablet 3  . nitroGLYCERIN (NITROSTAT) 0.4 MG SL tablet Place 1 tablet (0.4 mg total) under the tongue every 5 (five) minutes as needed for chest pain. 25 tablet 3   No current facility-administered medications for this visit.     Allergies:    Allergies  Allergen Reactions  . Furosemide     pancreatitis  . Diamox [Acetazolamide] Itching    hives  . Lisinopril Rash    Social History:  The patient  reports that  has never smoked. he has never used smokeless tobacco. He reports that he does not drink alcohol or use drugs.   ROS:  Please see the history of present illness.   No fevers, no chills, no orthopnea, no PND   PHYSICAL EXAM: VS:  BP (!) 118/48   Pulse 62   Resp 16   Ht 5' 11"  (1.803 m)   Wt 248 lb (112.5 kg)   SpO2 98%   BMI 34.59 kg/m  GEN: Well nourished, well developed, in no acute distress  HEENT: blindness Neck: no JVD, carotid bruits, or masses Cardiac: RRR; no murmurs, rubs, or gallops,no edema  Respiratory:  clear to auscultation bilaterally, normal work of breathing GI: soft, nontender, nondistended, + BS MS: no deformity or atrophy  Skin: warm and dry, no rash Neuro:  Alert and Oriented x 3, Strength and sensation are intact Psych:  euthymic mood, full affect   EKG:  12/27/14-sinus rhythm, 75, poor R-wave progression  ECHO: 07/28/16 - Left ventricle: The cavity size was normal. Wall thickness was   increased in  a pattern of moderate LVH. Systolic function was   normal. The estimated ejection fraction was in the range of 60%   to 65%. Wall motion was normal; there were no regional wall   motion abnormalities. Doppler parameters are consistent with   abnormal left ventricular relaxation (grade 1 diastolic   dysfunction). Doppler parameters are consistent with high   ventricular filling pressure. - Mitral valve: Calcified annulus. - Pericardium, extracardiac: A trivial pericardial effusion was   identified.  Impressions:  - Normal LV systolic function; moderate LVH; grade 1 diastolic   dysfunction with elevated LV filling pressure.  Nuclear stress test 07/27/16:  - Low risk, no ischemia, ejection fraction 56%  ASSESSMENT AND PLAN:  1. Coronary artery disease-no chest pain.  isosorbide to 120 mg once a day , Ranexa. Nuclear stress test August 2017 reassuring. 2. Diabetes- [name] managing. Hemoglobin A1c 6.4, excellent. 3. Obesity-great job with weight loss overall. 4. Hyperlipidemia-continue with statin. Refills given. LDL goal less than 70. 5. Blindness-no changes, [facility].  6. 6 month follow-up  Signed, Candee Furbish, MD [facility]  11/04/2017 5:28 PM

## 2017-11-04 NOTE — Patient Instructions (Signed)

## 2017-11-17 ENCOUNTER — Telehealth: Payer: Self-pay | Admitting: Internal Medicine

## 2017-11-17 NOTE — Telephone Encounter (Signed)
Humalog is a short acting insulin Levemir and Lantus are long-acting insulins - these are not alternatives to Humalog The insurance company will have to let us know which short acting insulin they cover

## 2017-11-17 NOTE — Telephone Encounter (Signed)
Humalog is not covered by patients insurance.  Levemir or Lantus is covered by patient's insurance.  Please advise if changing is appropriate

## 2017-11-17 NOTE — Telephone Encounter (Signed)
Medication request.

## 2017-11-17 NOTE — Telephone Encounter (Signed)
Copied from [place]. Topic: Quick Communication - See Telephone Encounter >> Nov 17, 2017  1:40 PM Arletha Grippe wrote: CRM for notification. See Telephone encounter for:   11/17/17.health team advantage called - pt is out of insulin and would like provider to call in for prior auth for humalog. Or we can prescribe an alternative that is on the formulary.  - an alternative is fiasp soltion, or lantus solarstar solution levimir flex touch solution.these are all tier 3 drugs and are covered.  Manson Passey [phone] please also call pt or wife to let them know that issue is being handled.  Pt is out of medication

## 2017-11-18 ENCOUNTER — Other Ambulatory Visit: Payer: Self-pay | Admitting: Internal Medicine

## 2017-11-18 DIAGNOSIS — Z794 Long term (current) use of insulin: Secondary | ICD-10-CM

## 2017-11-18 DIAGNOSIS — E118 Type 2 diabetes mellitus with unspecified complications: Secondary | ICD-10-CM

## 2017-11-18 MED ORDER — INSULIN NPH ISOPHANE & REGULAR (70-30) 100 UNIT/ML ~~LOC~~ SUSP: SUBCUTANEOUS | 3 refills | Status: DC

## 2017-11-18 NOTE — Telephone Encounter (Signed)
RX sent

## 2017-11-18 NOTE — Telephone Encounter (Signed)
Pt spouse informed Novolin has been sent in.

## 2017-11-18 NOTE — Telephone Encounter (Signed)
PCP sent in Novolin.

## 2017-11-18 NOTE — Telephone Encounter (Signed)
Spoke to pt spouse Archivist). She stated that she is upset that this has not been resolved. She stated that pt has been without insulin for too long. She stated that the alternatives that insurance will cover was given. I explained that the alternatives were not the same type of insulin and are not options (long vs short acting).   I informed spouse that we sent a mychart message responding to her initial request to change on the 14th of November and did not receive a response.

## 2017-11-18 NOTE — Telephone Encounter (Signed)
Spouse called back.  States that insurance will cover tresiba, pougeo?, novolin.  Requesting one to be sent in and called at [phone] once sent.

## 2017-11-18 NOTE — Telephone Encounter (Signed)
[name]

## 2017-11-19 ENCOUNTER — Ambulatory Visit (INDEPENDENT_AMBULATORY_CARE_PROVIDER_SITE_OTHER): Payer: PPO | Admitting: Neurology

## 2017-11-19 ENCOUNTER — Encounter: Payer: Self-pay | Admitting: Neurology

## 2017-11-19 VITALS — BP 164/82 | HR 96 | Ht 70.5 in | Wt 240.0 lb

## 2017-11-19 DIAGNOSIS — H547 Unspecified visual loss: Secondary | ICD-10-CM

## 2017-11-19 DIAGNOSIS — G4733 Obstructive sleep apnea (adult) (pediatric): Secondary | ICD-10-CM

## 2017-11-19 DIAGNOSIS — E669 Obesity, unspecified: Secondary | ICD-10-CM

## 2017-11-19 DIAGNOSIS — R400 Somnolence: Secondary | ICD-10-CM

## 2017-11-19 NOTE — Progress Notes (Signed)
Subjective:    Patient ID: [name] is a 66 y.o. male.  HPI     Star Age, MD, PhD [facility] [address].O. [place], [place] [address]  Dear Bonnita Levan,   I saw your patient, [name], upon your kind request in my clinic today for initial consultation of his sleep disorder, in particular, reevaluation of a prior diagnosis of OSA. The patient is accompanied by his wife today. As you know [name] is a 66 year old right-handed gentleman with an underlying medical history of arthritis, type 2 diabetes, glaucoma, hypertension, hyperlipidemia, low back pain, peripheral neuropathy, legal blindness, coronary artery disease, congestive heart failure and obesity, who was previously diagnosed with obstructive sleep apnea. Prior sleep study results are not available for my review today. I reviewed your office note from 10/06/2017. He has never been on CPAP but was advised to start CPAP therapy in the past. He reports snoring and daytime somnolence. His Epworth sleepiness score is 20 out of 24, fatigue score is 48 out of 63. He is married and lives with his wife. He does not work. He is a nonsmoker and does not use alcohol. He drinks caffeine about 3 servings per day, typically in the form of coffee. Occasional soda. He has 3 sons. He does not have night to night nocturia. He has had rare morning headaches. Bedtime is late, around 1 AM and wakeup time generally around 9 AM. He does not indicate telltale symptoms of restless leg syndrome but is a restless sleeper. He has had back surgeries and nerve damage. He has neuropathy. He moves his legs in his sleep.   His Past Medical History Is Significant For: Past Medical History:  Diagnosis Date  . Arthritis    "mild arthritis in hip"  . CAD (coronary artery disease)   . CHF (congestive heart failure) ([place])   . Complication of anesthesia    "lungs filled up with fluid"   . Diabetes mellitus    Type II  .  Glaucoma   . Heart murmur   . HTN (hypertension)   . Hypercholesterolemia   . LBP (low back pain)   . Legally blind   . Peripheral neuropathy   . Sleep apnea   . Staph infection    "from back surgery"  . Trigger finger   . Ulcer   . Wears glasses    "to protect cornea" - legally blind    His Past Surgical History Is Significant For: Past Surgical History:  Procedure Laterality Date  . CHOLECYSTECTOMY  09/15/2012   Procedure: LAPAROSCOPIC CHOLECYSTECTOMY WITH INTRAOPERATIVE CHOLANGIOGRAM;  Surgeon: Pedro Earls, MD;  Location: [facility];  Service: General;  Laterality: N/A;  . EYE SURGERY Bilateral    cataracts  . INGUINAL HERNIA REPAIR     right  . LUMBAR FUSION    . LUMBAR LAMINECTOMY/DECOMPRESSION MICRODISCECTOMY N/A 10/26/2015   Procedure: RIGHT AND CENTRAL LUMBAR LAMINECTOMY L3-4, RIGHT L5-S1 LATERAL RECESS DECOMPRESSION;  Surgeon: Jessy Oto, MD;  Location: [place] [facility];  Service: Orthopedics;  Laterality: N/A;  . NECK SURGERY    . peptic ulcer dz surgery  pt was in his 20s   bleeding ulcer.   . Prosthetic Cornea placement, Right eye  2007   [place] Right 10/26/2015   Procedure: RELEASE TRIGGER FINGER RIGHT THUMB;  Surgeon: Jessy Oto, MD;  Location: [facility];  Service: Orthopedics;  Laterality: Right;    His Family  History Is Significant For: Family History  Problem Relation Age of Onset  . Breast cancer Mother   . Colon cancer Mother   . Hypertension Mother   . Diabetes Mother   . Hypertension Father   . Colon cancer Other        Elevated Risk for  . Diabetes Sister   . Diabetes Brother     His Social History Is Significant For: Social History   Socioeconomic History  . Marital status: Married    Spouse name: None  . Number of children: 3  . Years of education: 38  . Highest education level: None  Social Needs  . Financial resource strain: None  . Food insecurity - worry: None  . Food insecurity - inability: None  .  Transportation needs - medical: None  . Transportation needs - non-medical: None  Occupational History  . Occupation: disabled    Comment: blind  Tobacco Use  . Smoking status: Never Smoker  . Smokeless tobacco: Never Used  Substance and Sexual Activity  . Alcohol use: No  . Drug use: No  . Sexual activity: Not Currently  Other Topics Concern  . None  Social History Narrative   Occupation: disabled, blind   Married   Regular Exercise-no   Lives at home with his wife.   Right-handed.   2-3 cups caffeine per day.    His Allergies Are:  Allergies  Allergen Reactions  . Furosemide     pancreatitis  . Diamox [Acetazolamide] Itching    hives  . Lisinopril Rash  :   His Current Medications Are:  Outpatient Encounter Medications as of 11/19/2017  Medication Sig  . amLODipine (NORVASC) 5 MG tablet Take 1 tablet (5 mg total) by mouth daily.  Marland Kitchen aspirin 81 MG EC tablet Take 1 tablet (81 mg total) by mouth daily.  Marland Kitchen atorvastatin (LIPITOR) 80 MG tablet Take 1 tablet (80 mg total) by mouth daily at 6 PM.  . Blood Glucose Calibration (ONETOUCH VERIO) SOLN 1 Act by In Vitro route 2 (two) times daily.  . Blood Glucose Monitoring Suppl (PRODIGY VOICE BLOOD GLUCOSE) W/DEVICE KIT Use to check blood sugars twice a day Dx e11.9  . carvedilol (COREG) 12.5 MG tablet TAKE 1 TABLET BY MOUTH TWICE A DAY WITH A MEAL  . Continuous Blood Gluc Receiver (FREESTYLE LIBRE READER) DEVI 1 Act by Does not apply route 3 (three) times daily with meals.  . Continuous Blood Gluc Sensor (FREESTYLE LIBRE SENSOR SYSTEM) MISC 1 ACT BY DOES NOT APPLY ROUTE 3 (THREE) TIMES DAILY WITH MEALS.  . cyanocobalamin 2000 MCG tablet Take 1 tablet (2,000 mcg total) by mouth daily.  Marland Kitchen ezetimibe (ZETIA) 10 MG tablet Take 1 tablet (10 mg total) by mouth daily.  . fluticasone (FLONASE) 50 MCG/ACT nasal spray Place 2 sprays into both nostrils daily.  Marland Kitchen gabapentin (NEURONTIN) 300 MG capsule Take 300 mg by mouth daily.  Marland Kitchen glucose  blood (ONETOUCH VERIO) test strip Use BID to check BS. DX E11.9  . insulin NPH-regular Human (NOVOLIN 70/30) (70-30) 100 UNIT/ML injection 85 U SQ QAM and 45 U SQ QPM  . isosorbide mononitrate (IMDUR) 120 MG 24 hr tablet TAKE 1 TABLET (120 MG TOTAL) BY MOUTH DAILY.  Marland Kitchen JANUMET 50-1000 MG tablet TAKE 1 TABLET BY MOUTH TWICE A DAY WITH A MEAL  . levocetirizine (XYZAL) 5 MG tablet Take 1 tablet (5 mg total) by mouth every evening.  Marland Kitchen losartan (COZAAR) 50 MG tablet Take 1 tablet (50  mg total) by mouth daily.  Marland Kitchen omega-3 acid ethyl esters (LOVAZA) 1 g capsule Take 2 capsules (2 g total) by mouth 2 (two) times daily.  . pantoprazole (PROTONIX) 40 MG tablet Take 1 tablet (40 mg total) by mouth 2 (two) times daily.  Marland Kitchen PRODIGY LANCETS 28G MISC Use to check blood sugars twice a day Dx e11.9  . RANEXA 500 MG 12 hr tablet TAKE 1 TABLET TWICE A DAY  . tamsulosin (FLOMAX) 0.4 MG CAPS capsule Take 1 capsule (0.4 mg total) by mouth daily.  . traMADol (ULTRAM) 50 MG tablet Take 1 tablet (50 mg total) by mouth every 8 (eight) hours as needed.  . nitroGLYCERIN (NITROSTAT) 0.4 MG SL tablet Place 1 tablet (0.4 mg total) under the tongue every 5 (five) minutes as needed for chest pain.   No facility-administered encounter medications on file as of 11/19/2017.   :  Review of Systems:  Out of a complete 14 point review of systems, all are reviewed and negative with the exception of these symptoms as listed below:  Review of Systems  Neurological:       Pt presents today to discuss his sleep. Pt has had several sleep studies and has been told he needs a cpap but could not afford it at the time. Pt does endorse snoring.  Epworth Sleepiness Scale 0= would never doze 1= slight chance of dozing 2= moderate chance of dozing 3= high chance of dozing  Sitting and reading: 3 Watching TV: 3 Sitting inactive in a public place (ex. Theater or meeting): 1 As a passenger in a car for an hour without a break: 3 Lying down  to rest in the afternoon: 3 Sitting and talking to someone: 1 Sitting quietly after lunch (no alcohol): 3 In a car, while stopped in traffic: 3 Total: 20     Objective:  Neurological Exam  Physical Exam Physical Examination:   Vitals:   11/19/17 0905  BP: (!) 164/82  Pulse: 96   General Examination: The patient is a very pleasant 66 y.o. male in no acute distress. He appears well-developed and well-nourished and well groomed.   HEENT: Normocephalic, atraumatic, pupils are equal, round and reactive to light and accommodation. He is visually impaired, R eye deformity, s/p R corneal tx. Extraocular tracking is fair with the L eye. Normal smooth pursuit is noted. Hearing is grossly intact. Face is symmetric with normal facial animation and normal facial sensation. Speech is clear with no dysarthria noted. There is no hypophonia. There is no lip, neck/head, jaw or voice tremor. Neck is supple with full range of passive and active motion. There are no carotid bruits on auscultation. Oropharynx exam reveals: mild mouth dryness, adequate dental hygiene with dentures, and multiple missing teeth on the bottom, moderate airway crowding, due to thicker soft palate, larger tongue. Mallampati is class II. Tongue protrudes centrally and palate elevates symmetrically. Tonsils are absent and s/p uvulectomy. Neck size is 18 1/8 inches.   Chest: Clear to auscultation without wheezing, rhonchi or crackles noted.  Heart: S1+S2+0, regular and normal without murmurs, rubs or gallops noted.   Abdomen: Soft, non-tender and non-distended with normal bowel sounds appreciated on auscultation.  Extremities: There is 2+ pitting edema in the R ankle.   Skin: Warm and dry without trophic changes noted.  Musculoskeletal: exam reveals no obvious joint deformities, tenderness or joint swelling or erythema.   Neurologically:  Mental status: The patient is awake, alert and oriented in all 4 spheres. Her  immediate and  remote memory, attention, language skills and fund of knowledge are appropriate. There is no evidence of aphasia, agnosia, apraxia or anomia. Speech is clear with normal prosody and enunciation. Thought process is linear. Mood is normal and affect is normal.  Cranial nerves II - XII are as described above under HEENT exam. In addition: shoulder shrug is normal with equal shoulder height noted. Motor exam: thin bulk, normal strength on L, R leg weakness, R foot drop. Romberg is not doable Reflexes are 1+ on the UEs, absent in the LEs. Fine motor skills and coordination: globally mildly impaired.  Cerebellar testing: No dysmetria or intention tremor on finger to nose testing. Heel to shin is unremarkable bilaterally. There is no truncal or gait ataxia.  Sensory exam: intact to light touch in the upper and lower extremities.  Gait, station and balance: He stands with difficulty. Walks with a cane.               Assessment and Plan:  In summary, [name] is a very pleasant 66 y.o.-year old male with an underlying medical history of arthritis, type 2 diabetes, glaucoma, hypertension, hyperlipidemia, low back pain, peripheral neuropathy, legal blindness, coronary artery disease, congestive heart failure and obesity, who with a history and physical exam concerning for obstructive sleep apnea (OSA). I had a long chat with the patient and his wife about my findings and the diagnosis of OSA, its prognosis and treatment options. We talked about medical treatments, surgical interventions and non-pharmacological approaches. I explained in particular the risks and ramifications of untreated moderate to severe OSA, especially with respect to developing cardiovascular disease down the Road, including congestive heart failure, difficult to treat hypertension, cardiac arrhythmias, or stroke. Even type 2 diabetes has, in part, been linked to untreated OSA. Symptoms of untreated OSA include daytime sleepiness, memory  problems, mood irritability and mood disorder such as depression and anxiety, lack of energy, as well as recurrent headaches, especially morning headaches. We talked about trying to maintain a healthy lifestyle in general, as well as the importance of weight control. I encouraged the patient to eat healthy, exercise daily and keep well hydrated, to keep a scheduled bedtime and wake time routine, to not skip any meals and eat healthy snacks in between meals.He does not drive. He is able to stay overnight and would be able to manage during the sleep study indicates. He may need some assistance to the bathroom and back.  I recommended the following at this time: sleep study with potential positive airway pressure titration. (We will score hypopneas at 4%).   I explained the sleep test procedure to the patient and also outlined possible surgical and non-surgical treatment options of OSA, including the use of a custom-made dental device (which would require a referral to a specialist dentist or oral surgeon), upper airway surgical options, such as pillar implants, radiofrequency surgery, tongue base surgery, and UPPP (which would involve a referral to an ENT surgeon). Rarely, jaw surgery such as mandibular advancement may be considered.  I also explained the CPAP treatment option to the patient, who indicated that he would be willing to try CPAP if the need arises. I explained the importance of being compliant with PAP treatment, not only for insurance purposes but primarily to improve His symptoms, and for the patient's long term health benefit, including to reduce His cardiovascular risks. I answered all their questions today and the patient and his wife were in agreement. I would like to  see him back after the sleep study is completed and encouraged him to call with any interim questions, concerns, problems or updates.   Thank you very much for allowing me to participate in the care of this nice patient. If I  can be of any further assistance to you please do not hesitate to talk to me.  Sincerely,   Star Age, MD, PhD

## 2017-11-19 NOTE — Patient Instructions (Signed)

## 2017-11-22 ENCOUNTER — Encounter: Payer: Self-pay | Admitting: Internal Medicine

## 2017-11-22 DIAGNOSIS — E118 Type 2 diabetes mellitus with unspecified complications: Secondary | ICD-10-CM

## 2017-11-22 DIAGNOSIS — Z794 Long term (current) use of insulin: Secondary | ICD-10-CM

## 2017-11-23 DIAGNOSIS — E119 Type 2 diabetes mellitus without complications: Secondary | ICD-10-CM | POA: Diagnosis not present

## 2017-11-23 DIAGNOSIS — M5416 Radiculopathy, lumbar region: Secondary | ICD-10-CM | POA: Diagnosis not present

## 2017-11-23 DIAGNOSIS — R269 Unspecified abnormalities of gait and mobility: Secondary | ICD-10-CM | POA: Diagnosis not present

## 2017-11-23 MED ORDER — INSULIN NPH ISOPHANE & REGULAR (70-30) 100 UNIT/ML ~~LOC~~ SUSP: SUBCUTANEOUS | 3 refills | Status: DC

## 2017-11-24 ENCOUNTER — Other Ambulatory Visit (INDEPENDENT_AMBULATORY_CARE_PROVIDER_SITE_OTHER): Payer: PPO

## 2017-11-24 ENCOUNTER — Ambulatory Visit (INDEPENDENT_AMBULATORY_CARE_PROVIDER_SITE_OTHER): Payer: PPO | Admitting: Internal Medicine

## 2017-11-24 ENCOUNTER — Encounter: Payer: Self-pay | Admitting: Internal Medicine

## 2017-11-24 VITALS — BP 132/68 | HR 80 | Temp 98.8°F | Resp 16 | Ht 70.5 in | Wt 239.0 lb

## 2017-11-24 DIAGNOSIS — Z794 Long term (current) use of insulin: Secondary | ICD-10-CM

## 2017-11-24 DIAGNOSIS — E785 Hyperlipidemia, unspecified: Secondary | ICD-10-CM | POA: Diagnosis not present

## 2017-11-24 DIAGNOSIS — E118 Type 2 diabetes mellitus with unspecified complications: Secondary | ICD-10-CM

## 2017-11-24 DIAGNOSIS — I10 Essential (primary) hypertension: Secondary | ICD-10-CM

## 2017-11-24 DIAGNOSIS — D510 Vitamin B12 deficiency anemia due to intrinsic factor deficiency: Secondary | ICD-10-CM | POA: Diagnosis not present

## 2017-11-24 LAB — CBC WITH DIFFERENTIAL/PLATELET
BASOS ABS: 0 10*3/uL (ref 0.0–0.1)
BASOS PCT: 0.6 % (ref 0.0–3.0)
EOS ABS: 0.2 10*3/uL (ref 0.0–0.7)
Eosinophils Relative: 4.5 % (ref 0.0–5.0)
HEMATOCRIT: 35.5 % — AB (ref 39.0–52.0)
HEMOGLOBIN: 11.4 g/dL — AB (ref 13.0–17.0)
LYMPHS PCT: 35.3 % (ref 12.0–46.0)
Lymphs Abs: 1.6 10*3/uL (ref 0.7–4.0)
MCHC: 32.2 g/dL (ref 30.0–36.0)
MCV: 73.3 fl — ABNORMAL LOW (ref 78.0–100.0)
Monocytes Absolute: 0.4 10*3/uL (ref 0.1–1.0)
Monocytes Relative: 8.3 % (ref 3.0–12.0)
NEUTROS PCT: 51.3 % (ref 43.0–77.0)
Neutro Abs: 2.4 10*3/uL (ref 1.4–7.7)
PLATELETS: 199 10*3/uL (ref 150.0–400.0)
RBC: 4.84 Mil/uL (ref 4.22–5.81)
RDW: 14.9 % (ref 11.5–15.5)
WBC: 4.6 10*3/uL (ref 4.0–10.5)

## 2017-11-24 LAB — POCT GLYCOSYLATED HEMOGLOBIN (HGB A1C): Hemoglobin A1C: 7.3

## 2017-11-24 LAB — COMPREHENSIVE METABOLIC PANEL
ALBUMIN: 4.1 g/dL (ref 3.5–5.2)
ALK PHOS: 53 U/L (ref 39–117)
ALT: 13 U/L (ref 0–53)
AST: 13 U/L (ref 0–37)
BUN: 14 mg/dL (ref 6–23)
CALCIUM: 8.5 mg/dL (ref 8.4–10.5)
CHLORIDE: 102 meq/L (ref 96–112)
CO2: 27 mEq/L (ref 19–32)
CREATININE: 0.96 mg/dL (ref 0.40–1.50)
GFR: 100.09 mL/min (ref >60.00)
Glucose, Bld: 125 mg/dL — ABNORMAL HIGH (ref 70–99)
Potassium: 4.1 mEq/L (ref 3.5–5.1)
Sodium: 138 mEq/L (ref 135–145)
Total Bilirubin: 0.8 mg/dL (ref 0.2–1.2)
Total Protein: 7 g/dL (ref 6.0–8.3)

## 2017-11-24 LAB — LIPID PANEL
CHOLESTEROL: 123 mg/dL (ref 0–200)
HDL: 29.7 mg/dL — ABNORMAL LOW (ref >39.00)
LDL CALC: 69 mg/dL (ref 0–99)
NonHDL: 93.61
TRIGLYCERIDES: 123 mg/dL (ref 0.0–149.0)
Total CHOL/HDL Ratio: 4
VLDL: 24.6 mg/dL (ref 0.0–40.0)

## 2017-11-24 NOTE — Patient Instructions (Signed)

## 2017-11-24 NOTE — Progress Notes (Signed)
Subjective:  Patient ID: [name], male    DOB: Aug 10, 1951  Age: 66 y.o. MRN: [id number]  CC: Hypertension; Diabetes; Hyperlipidemia; and Anemia   HPI [name] presents for f/up - he has mild, chronic DOE with extremes of exertion and complains of lower extremity edema.  It appears that amlodipine was recently added to his medical regimen.  He denies any recent episodes of chest pain, palpitations, fatigue, dizziness, lightheadedness, or near syncope.  He feels like his blood sugars have been well controlled.  Outpatient Medications Prior to Visit  Medication Sig Dispense Refill  . aspirin 81 MG EC tablet Take 1 tablet (81 mg total) by mouth daily. 90 tablet 1  . atorvastatin (LIPITOR) 80 MG tablet Take 1 tablet (80 mg total) by mouth daily at 6 PM. 90 tablet 1  . Blood Glucose Calibration (ONETOUCH VERIO) SOLN 1 Act by In Vitro route 2 (two) times daily. 1 each 11  . Blood Glucose Monitoring Suppl (PRODIGY VOICE BLOOD GLUCOSE) W/DEVICE KIT Use to check blood sugars twice a day Dx e11.9 1 kit 0  . carvedilol (COREG) 12.5 MG tablet TAKE 1 TABLET BY MOUTH TWICE A DAY WITH A MEAL 180 tablet 3  . Continuous Blood Gluc Receiver (FREESTYLE LIBRE READER) DEVI 1 Act by Does not apply route 3 (three) times daily with meals. 1 Device 1  . Continuous Blood Gluc Sensor (FREESTYLE LIBRE SENSOR SYSTEM) MISC 1 ACT BY DOES NOT APPLY ROUTE 3 (THREE) TIMES DAILY WITH MEALS. 1 each 1  . cyanocobalamin 2000 MCG tablet Take 1 tablet (2,000 mcg total) by mouth daily. 90 tablet 3  . ezetimibe (ZETIA) 10 MG tablet Take 1 tablet (10 mg total) by mouth daily. 90 tablet 3  . fluticasone (FLONASE) 50 MCG/ACT nasal spray Place 2 sprays into both nostrils daily. 48 g 3  . gabapentin (NEURONTIN) 300 MG capsule Take 300 mg by mouth daily.    Marland Kitchen glucose blood (ONETOUCH VERIO) test strip Use BID to check BS. DX E11.9 100 each 12  . insulin NPH-regular Human (NOVOLIN 70/30) (70-30) 100 UNIT/ML injection 85 U SQ QAM  and 45 U SQ QPM 12 vial 3  . isosorbide mononitrate (IMDUR) 120 MG 24 hr tablet TAKE 1 TABLET (120 MG TOTAL) BY MOUTH DAILY. 90 tablet 3  . JANUMET 50-1000 MG tablet TAKE 1 TABLET BY MOUTH TWICE A DAY WITH A MEAL 180 tablet 1  . levocetirizine (XYZAL) 5 MG tablet Take 1 tablet (5 mg total) by mouth every evening. 90 tablet 3  . losartan (COZAAR) 50 MG tablet Take 1 tablet (50 mg total) by mouth daily. 90 tablet 3  . omega-3 acid ethyl esters (LOVAZA) 1 g capsule Take 2 capsules (2 g total) by mouth 2 (two) times daily. 360 capsule 2  . pantoprazole (PROTONIX) 40 MG tablet Take 1 tablet (40 mg total) by mouth 2 (two) times daily. 180 tablet 3  . PRODIGY LANCETS 28G MISC Use to check blood sugars twice a day Dx e11.9 100 each 3  . RANEXA 500 MG 12 hr tablet TAKE 1 TABLET TWICE A DAY 180 tablet 1  . tamsulosin (FLOMAX) 0.4 MG CAPS capsule Take 1 capsule (0.4 mg total) by mouth daily. 15 capsule 1  . traMADol (ULTRAM) 50 MG tablet Take 1 tablet (50 mg total) by mouth every 8 (eight) hours as needed. 270 tablet 3  . amLODipine (NORVASC) 5 MG tablet Take 1 tablet (5 mg total) by mouth daily. [place]  tablet 3  . nitroGLYCERIN (NITROSTAT) 0.4 MG SL tablet Place 1 tablet (0.4 mg total) under the tongue every 5 (five) minutes as needed for chest pain. 25 tablet 3   No facility-administered medications prior to visit.     ROS Review of Systems  Constitutional: Negative.  Negative for activity change, diaphoresis, fatigue and unexpected weight change.  HENT: Negative.   Eyes: Negative for visual disturbance.  Respiratory: Positive for shortness of breath. Negative for cough, chest tightness and wheezing.   Cardiovascular: Positive for leg swelling. Negative for chest pain and palpitations.  Gastrointestinal: Negative for abdominal pain, constipation, diarrhea, nausea and vomiting.  Endocrine: Negative.  Negative for polydipsia, polyphagia and polyuria.  Genitourinary: Negative.  Negative for difficulty  urinating, dysuria, hematuria and urgency.  Musculoskeletal: Negative.  Negative for arthralgias and myalgias.  Skin: Negative.   Allergic/Immunologic: Negative.   Neurological: Negative.  Negative for dizziness, weakness, light-headedness and numbness.  Hematological: Negative.  Negative for adenopathy. Does not bruise/bleed easily.  Psychiatric/Behavioral: Negative.     Objective:  BP 132/68 (BP Location: Right Arm, Patient Position: Sitting, Cuff Size: Normal)   Pulse 80   Temp 98.8 F (37.1 C) (Oral)   Resp 16   Ht 5' 10.5" (1.791 m)   Wt 239 lb (108.4 kg)   SpO2 95%   BMI 33.81 kg/m   BP Readings from Last 3 Encounters:  11/24/17 132/68  11/19/17 (!) 164/82  11/04/17 (!) 118/48    Wt Readings from Last 3 Encounters:  11/24/17 239 lb (108.4 kg)  11/19/17 240 lb (108.9 kg)  11/04/17 248 lb (112.5 kg)    Physical Exam  Constitutional: He is oriented to person, place, and time. No distress.  HENT:  Mouth/Throat: Oropharynx is clear and moist. No oropharyngeal exudate.  Eyes: Conjunctivae are normal. Left eye exhibits no discharge. No scleral icterus.  Neck: Normal range of motion. Neck supple. No JVD present. No thyromegaly present.  Cardiovascular: Normal rate, regular rhythm and normal heart sounds.  No murmur heard. Pulmonary/Chest: Effort normal and breath sounds normal. No respiratory distress. He has no rales.  Abdominal: Soft. Bowel sounds are normal. He exhibits no distension. There is no rebound and no guarding.  Musculoskeletal: He exhibits edema (1+ pitting edema BLE). He exhibits no tenderness.  Lymphadenopathy:    He has no cervical adenopathy.  Neurological: He is alert and oriented to person, place, and time.  Skin: Skin is warm and dry. No rash noted. He is not diaphoretic. No erythema. No pallor.  Vitals reviewed.   Lab Results  Component Value Date   WBC 4.6 11/24/2017   HGB 11.4 (L) 11/24/2017   HCT 35.5 (L) 11/24/2017   PLT 199.0 11/24/2017    GLUCOSE 125 (H) 11/24/2017   CHOL 123 11/24/2017   TRIG 123.0 11/24/2017   HDL 29.70 (L) 11/24/2017   LDLDIRECT 82.7 04/04/2013   LDLCALC 69 11/24/2017   ALT 13 11/24/2017   AST 13 11/24/2017   NA 138 11/24/2017   K 4.1 11/24/2017   CL 102 11/24/2017   CREATININE 0.96 11/24/2017   BUN 14 11/24/2017   CO2 27 11/24/2017   TSH 1.73 11/24/2017   PSA 1.99 04/09/2016   INR 0.95 08/17/2010   HGBA1C 7.3 11/24/2017   MICROALBUR 0.6 07/22/2017    Mr Brain Wo Contrast  Result Date: 10/30/2017  [name] [address], [place], [place] [address] [phone] NEUROIMAGING REPORT STUDY DATE: 10/29/2017 PATIENT NAME: IZAIAS KRUPKA DOB: July 04, 1951  MRN: [id number] ORDERING CLINICIAN: Dr Leta Baptist CLINICAL HISTORY:  49 year patient with memory loss COMPARISON FILMS: MRI Brain 11/08/2015 EXAM: MRI Brain wo TECHNIQUE: MRI of the brain without contrast was obtained utilizing 5 mm axial slices with T1, T2, T2 flair, T2 star gradient echo and diffusion weighted views.  T1 sagittal and T2 coronal views were obtained. CONTRAST:none IMAGING SITE: [facility] FINDINGS: The brain parenchyma shows mild changes of chronic microvascular ischemia and age-appropriate mild generalized cerebral atrophy.  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  The paranasal sinuses show mild chronic inflammatory changes. No abnormal lesions are seen on diffusion-weighted views to suggest acute ischemia. The cortical sulci, fissures and cisterns are normal in size and appearance. Lateral, third and fourth ventricle are normal in size and appearance. No extra-axial fluid collections are seen. No evidence of mass effect or midline shift.  On sagittal views the posterior fossa, pituitary gland and corpus callosum are unremarkable. No evidence of intracranial hemorrhage on gradient-echo views. The orbits and their contents, paranasal sinuses and calvarium are  unremarkable.  Intracranial flow voids are present.   Abnormal MRI scan of the brain showing mild changes of chronic microvascular ischemia and generalized cerebral atrophy and paranasal sinus inflammation which appears to have progressed slightly compared with previous MRI scan dated 11/08/2015 INTERPRETING PHYSICIAN: PRAMOD SETHI, MD Certified in  Neuroimaging by [employer] and [employer]    [place]:   Sanjay was seen today for hypertension, diabetes, hyperlipidemia and anemia.  Diagnoses and all orders for this visit:  Type 2 diabetes mellitus with complication, with long-term current use of insulin ([place])- His A1c is at 7.3%.  His blood sugars are adequately well controlled.  Will continue the current regimen. -     POCT HgB A1C -     Comprehensive metabolic panel; Future  HYPERTENSION, BENIGN ESSENTIAL- His blood pressure is well controlled but he complains of lower extremity edema.  Will therefore discontinue the calcium channel blocker.  Electrolytes and renal function are normal today. -     Thyroid Panel With TSH; Future -     Comprehensive metabolic panel; Future  Vitamin B12 deficiency anemia due to intrinsic factor deficiency- His H&H are normal.  Will continue oral B12 replacement therapy. -     CBC with Differential/Platelet; Future  Hyperlipidemia LDL goal <70- He has achieved his LDL goal and is doing well on the statin. -     Thyroid Panel With TSH; Future -     Lipid panel; Future   I have discontinued Carloyn Manner B. Kafer's amLODipine. I am also having him maintain his ONETOUCH VERIO, PRODIGY VOICE BLOOD GLUCOSE, tamsulosin, glucose blood, PRODIGY LANCETS 28G, gabapentin, pantoprazole, JANUMET, omega-3 acid ethyl esters, atorvastatin, ezetimibe, cyanocobalamin, nitroGLYCERIN, fluticasone, levocetirizine, FREESTYLE LIBRE READER, aspirin, traMADol, carvedilol, losartan, isosorbide mononitrate, RANEXA, FREESTYLE  LIBRE SENSOR SYSTEM, and insulin NPH-regular Human.  No orders of the defined types were placed in this encounter.    Follow-up: Return in about 6 months (around 05/25/2018).  [name], MD

## 2017-11-25 ENCOUNTER — Encounter: Payer: Self-pay | Admitting: Internal Medicine

## 2017-11-25 LAB — THYROID PANEL WITH TSH
Free Thyroxine Index: 3.1 (ref 1.4–3.8)
T3 Uptake: 34 % (ref 22–35)
T4 TOTAL: 9.1 ug/dL (ref 4.9–10.5)
TSH: 1.73 mIU/L (ref 0.40–4.50)

## 2017-12-10 ENCOUNTER — Institutional Professional Consult (permissible substitution): Payer: PPO | Admitting: Neurology

## 2017-12-24 DIAGNOSIS — R269 Unspecified abnormalities of gait and mobility: Secondary | ICD-10-CM | POA: Diagnosis not present

## 2017-12-24 DIAGNOSIS — M5416 Radiculopathy, lumbar region: Secondary | ICD-10-CM | POA: Diagnosis not present

## 2017-12-24 DIAGNOSIS — E119 Type 2 diabetes mellitus without complications: Secondary | ICD-10-CM | POA: Diagnosis not present

## 2017-12-29 ENCOUNTER — Other Ambulatory Visit: Payer: Self-pay | Admitting: Internal Medicine

## 2017-12-29 ENCOUNTER — Ambulatory Visit (INDEPENDENT_AMBULATORY_CARE_PROVIDER_SITE_OTHER): Payer: PPO | Admitting: Neurology

## 2017-12-29 DIAGNOSIS — E669 Obesity, unspecified: Secondary | ICD-10-CM

## 2017-12-29 DIAGNOSIS — G4733 Obstructive sleep apnea (adult) (pediatric): Secondary | ICD-10-CM

## 2017-12-29 DIAGNOSIS — Z794 Long term (current) use of insulin: Secondary | ICD-10-CM

## 2017-12-29 DIAGNOSIS — R400 Somnolence: Secondary | ICD-10-CM

## 2017-12-29 DIAGNOSIS — G4720 Circadian rhythm sleep disorder, unspecified type: Secondary | ICD-10-CM

## 2017-12-29 DIAGNOSIS — E118 Type 2 diabetes mellitus with unspecified complications: Secondary | ICD-10-CM

## 2017-12-29 DIAGNOSIS — H547 Unspecified visual loss: Secondary | ICD-10-CM

## 2018-01-04 ENCOUNTER — Ambulatory Visit: Payer: PPO | Admitting: Podiatry

## 2018-01-04 ENCOUNTER — Other Ambulatory Visit: Payer: Self-pay | Admitting: Neurology

## 2018-01-04 DIAGNOSIS — G4733 Obstructive sleep apnea (adult) (pediatric): Secondary | ICD-10-CM

## 2018-01-04 DIAGNOSIS — H547 Unspecified visual loss: Secondary | ICD-10-CM

## 2018-01-04 DIAGNOSIS — R400 Somnolence: Secondary | ICD-10-CM

## 2018-01-04 DIAGNOSIS — E669 Obesity, unspecified: Secondary | ICD-10-CM

## 2018-01-04 NOTE — Progress Notes (Signed)
Patient referred by Dr. Leta Baptist, seen by me on 11/19/17, diagnostic PSG on 12/29/17.    Please call and notify the patient that the recent sleep study did confirm the diagnosis of moderate to severe obstructive sleep apnea and that I recommend treatment for this in the form of CPAP. This will require a repeat sleep study for proper titration and mask fitting. Please explain to patient and arrange for a CPAP titration study. I have placed an order in the chart. Thanks.  Star Age, MD, PhD [facility])

## 2018-01-04 NOTE — Procedures (Signed)
PATIENT'S NAME:  [name], [name] DOB:      06/21/1951      MR#:    [id number]     DATE OF RECORDING: 12/29/2017 REFERRING M.D.: Dr. Andris Baumann,       PCP: [name], MD Study Performed:   Baseline Polysomnogram HISTORY: 67 year old man with a history of arthritis, type 2 diabetes, glaucoma, hypertension, hyperlipidemia, low back pain, peripheral neuropathy, legal blindness, coronary artery disease, congestive heart failure and obesity, who was previously diagnosed with obstructive sleep apnea. He has never been on CPAP but was advised to start CPAP therapy in the past. His Epworth sleepiness score is 20 out of 24. The patient's weight 240 pounds with a height of 70 (inches), resulting in a BMI of 34. Kg/m2. The patient's neck circumference measured 18.1 inches.  CURRENT MEDICATIONS: Amlodipine, Aspirin, Atorvastatin, Carvedilol, Cyanocobalamin, Ezetimibe, Fluticasone, Gabapentin, Insulin, Isosorbide, Janumet, Levocetirizine, Losartan, Omega-3, Pantoprazole, Ranexa, Tasulosin, Tramadol and Nitroglycerin.   PROCEDURE:  This is a multichannel digital polysomnogram utilizing the Somnostar 11.2 system.  Electrodes and sensors were applied and monitored per AASM Specifications.   EEG, EOG, Chin and Limb EMG, were sampled at 200 Hz.  ECG, Snore and Nasal Pressure, Thermal Airflow, Respiratory Effort, CPAP Flow and Pressure, Oximetry was sampled at 50 Hz. Digital video and audio were recorded.      BASELINE STUDY  The patient was accompanied by his wife, who stayed overnight. Lights Out was at 21:08 and Lights On at 05:08.  Total recording time (TRT) was 481 minutes, with a total sleep time (TST) of  356 minutes.   The patient's sleep latency was 40 minutes, which is delayed. REM latency was 53.5 minutes, which is mildly reduced. The sleep efficiency was 74. %.     SLEEP ARCHITECTURE: WASO (Wake after sleep onset) was 92 minutes with mild to moderate sleep fragmentation noted.  There were 23.5 minutes  in Stage N1, 266.5 minutes Stage N2, 0 minutes Stage N3 and 66 minutes in Stage REM.  The percentage of Stage N1 was 6.6%, Stage N2 was 74.9%, which is increased, Stage N3 was absent and Stage R (REM sleep) was 18.5%. The arousals were noted as: 27 were spontaneous, 0 were associated with PLMs, 116 were associated with respiratory events.  Audio and video analysis did not show any abnormal or unusual movements, behaviors, phonations or vocalizations. The patient took 1 bathroom break. Mild to moderate snoring was noted. The EKG was in keeping with normal sinus rhythm (NSR).  RESPIRATORY ANALYSIS:  There were a total of 119 respiratory events:  29 obstructive apneas, 0 central apneas and 0 mixed apneas with a total of 29 apneas and an apnea index (AI) of 4.9 /hour. There were 90 hypopneas with a hypopnea index of 15.2 /hour. The patient also had 0 respiratory event related arousals (RERAs).      The total APNEA/HYPOPNEA INDEX (AHI) was 20.1/hour and the total RESPIRATORY DISTURBANCE INDEX was 20.1 /hour.  21 events occurred in REM sleep and 140 events in NREM. The REM AHI was 19.1 /hour, versus a non-REM AHI of 20.3. The patient spent 41 minutes of total sleep time in the supine position and 315 minutes in non-supine.. The supine AHI was 42.5 versus a non-supine AHI of 17.1.  OXYGEN SATURATION & C02:  The Wake baseline 02 saturation was 92%, with the lowest being 82%. Time spent below 89% saturation equaled 11 minutes.  PERIODIC LIMB MOVEMENTS: The patient had a total of 0 Periodic Limb Movements.  The Periodic Limb Movement (PLM) index was 0 and the PLM Arousal index was 0/hour. Post-study, the patient indicated that sleep was the same as usual.   IMPRESSION:  1. Obstructive Sleep Apnea (OSA) 2. Dysfunctions associated with sleep stages or arousal from sleep  RECOMMENDATIONS:  1. This study demonstrates moderate to severe obstructive sleep apnea, with a total AHI of 20.1/hour, REM AHI of  19.1/hour, supine AHI of 42.5/hour and O2 nadir of 82%. Treatment with positive airway pressure in the form of CPAP is recommended. This will require a full night titration study to optimize therapy. Other treatment options may include avoidance of supine sleep position along with weight loss, upper airway or jaw surgery in selected patients or the use of an oral appliance in certain patients. ENT evaluation and/or consultation with a maxillofacial surgeon or dentist may be feasible in some instances. Please note that untreated obstructive sleep apnea carries additional perioperative morbidity. Patients with significant obstructive sleep apnea should receive perioperative PAP therapy and the surgeons and particularly the anesthesiologist should be informed of the diagnosis and the severity of the sleep disordered breathing. 2. This study shows sleep fragmentation and abnormal sleep stage percentages; these are nonspecific findings and per se do not signify an intrinsic sleep disorder or a cause for the patient's sleep-related symptoms. Causes include (but are not limited to) the first night effect of the sleep study, circadian rhythm disturbances, medication effect or an underlying mood disorder or medical problem.  3. The patient should be cautioned not to drive (patient does not drive), work at heights, or operate dangerous or heavy equipment when tired or sleepy. Review and reiteration of good sleep hygiene measures should be pursued with any patient. 4. The patient will be seen in follow-up by Dr. Rexene Alberts at [facility] for discussion of the test results and further management strategies. The referring provider will be notified of the test results.  I certify that I have reviewed the entire raw data recording prior to the issuance of this report in accordance with the Standards of Accreditation of the American Academy of Sleep Medicine (AASM)  Star Age, MD, PhD Diplomat, American Board of Psychiatry and  Neurology (Neurology and Sleep Medicine)

## 2018-01-05 ENCOUNTER — Telehealth: Payer: Self-pay

## 2018-01-05 NOTE — Telephone Encounter (Signed)
I called pt. I advised pt that Dr. Athar reviewed their sleep study results and found that pt has moderate to severe osa and recommends that pt be treated with a cpap. Dr. Athar recommends that pt return for a repeat sleep study in order to properly titrate the cpap and ensure a good mask fit. Pt is agreeable to returning for a titration study. I advised pt that our sleep lab will file with pt's insurance and call pt to schedule the sleep study when we hear back from the pt's insurance regarding coverage of this sleep study. Pt verbalized understanding of results. Pt had no questions at this time but was encouraged to call back if questions arise.   

## 2018-01-05 NOTE — Telephone Encounter (Signed)
-----   Message from Star Age, MD sent at 01/04/2018  9:45 AM EST ----- Patient referred by Dr. Leta Baptist, seen by me on 11/19/17, diagnostic PSG on 12/29/17.    Please call and notify the patient that the recent sleep study did confirm the diagnosis of moderate to severe obstructive sleep apnea and that I recommend treatment for this in the form of CPAP. This will require a repeat sleep study for proper titration and mask fitting. Please explain to patient and arrange for a CPAP titration study. I have placed an order in the chart. Thanks.  Star Age, MD, PhD [facility])

## 2018-01-08 ENCOUNTER — Encounter: Payer: Self-pay | Admitting: Internal Medicine

## 2018-01-11 ENCOUNTER — Other Ambulatory Visit: Payer: Self-pay | Admitting: Internal Medicine

## 2018-01-11 DIAGNOSIS — E118 Type 2 diabetes mellitus with unspecified complications: Secondary | ICD-10-CM

## 2018-01-11 DIAGNOSIS — I10 Essential (primary) hypertension: Secondary | ICD-10-CM

## 2018-01-11 MED ORDER — IRBESARTAN 150 MG PO TABS: 150.0000 mg | ORAL_TABLET | Freq: Every day | ORAL | 1 refills | Status: DC

## 2018-01-14 ENCOUNTER — Encounter: Payer: Self-pay | Admitting: Internal Medicine

## 2018-01-19 ENCOUNTER — Other Ambulatory Visit: Payer: Self-pay | Admitting: Internal Medicine

## 2018-01-19 ENCOUNTER — Other Ambulatory Visit (INDEPENDENT_AMBULATORY_CARE_PROVIDER_SITE_OTHER): Payer: Self-pay | Admitting: Specialist

## 2018-01-19 DIAGNOSIS — E0849 Diabetes mellitus due to underlying condition with other diabetic neurological complication: Secondary | ICD-10-CM

## 2018-01-19 DIAGNOSIS — M961 Postlaminectomy syndrome, not elsewhere classified: Secondary | ICD-10-CM

## 2018-01-19 DIAGNOSIS — M5416 Radiculopathy, lumbar region: Secondary | ICD-10-CM

## 2018-01-20 NOTE — Telephone Encounter (Signed)
Gabapentin refill request 

## 2018-02-02 ENCOUNTER — Encounter: Payer: Self-pay | Admitting: Podiatry

## 2018-02-02 ENCOUNTER — Ambulatory Visit (INDEPENDENT_AMBULATORY_CARE_PROVIDER_SITE_OTHER): Payer: PPO | Admitting: Podiatry

## 2018-02-02 DIAGNOSIS — M21371 Foot drop, right foot: Secondary | ICD-10-CM

## 2018-02-02 DIAGNOSIS — E1140 Type 2 diabetes mellitus with diabetic neuropathy, unspecified: Secondary | ICD-10-CM

## 2018-02-02 DIAGNOSIS — B351 Tinea unguium: Secondary | ICD-10-CM | POA: Diagnosis not present

## 2018-02-02 NOTE — Progress Notes (Signed)
Complaint:  Visit Type: Patient returns to my office for continued preventative foot care services. Complaint: Patient states" my nails have grown long and thick and become painful to walk and wear shoes" Patient has been diagnosed with DM with angiopathy.. The patient presents for preventative foot care services. No changes to ROS.  Patient is blind.    Podiatric Exam: Vascular: dorsalis pedis and posterior tibial pulses are not  palpable bilateral. Capillary return is immediate. Temperature gradient is WNL. Skin turgor WNL  Sensorium: Diminished  Semmes Weinstein monofilament test. Normal tactile sensation bilaterally. Nail Exam: Pt has thick disfigured discolored nails with subungual debris noted bilateral entire nail hallux through fifth toenails Ulcer Exam: There is no evidence of ulcer or pre-ulcerative changes or infection. Orthopedic Exam: Muscle tone and strength WNL left foot.  Foot drop right foot.  No limitations in general ROM. No crepitus or effusions noted. Foot type and digits show no abnormalities. Bony prominences are unremarkable. Skin: No Porokeratosis. No infection or ulcers  Diagnosis:  Onychomycosis, , Pain in right toe, pain in left toes  Treatment & Plan Procedures and Treatment: Consent by patient was obtained for treatment procedures.   Debridement of mycotic and hypertrophic toenails, 1 through 5 bilateral and clearing of subungual debris. No ulceration, no infection noted. Patient was told to bring his brace to the office at the next visit for [place] to examine.   Return Visit-Office Procedure: Patient instructed to return to the office for a follow up visit 3 months for continued evaluation and treatment.    Gardiner Barefoot DPM

## 2018-02-05 ENCOUNTER — Ambulatory Visit (INDEPENDENT_AMBULATORY_CARE_PROVIDER_SITE_OTHER): Payer: PPO | Admitting: Neurology

## 2018-02-05 DIAGNOSIS — R400 Somnolence: Secondary | ICD-10-CM

## 2018-02-05 DIAGNOSIS — H547 Unspecified visual loss: Secondary | ICD-10-CM

## 2018-02-05 DIAGNOSIS — G4720 Circadian rhythm sleep disorder, unspecified type: Secondary | ICD-10-CM

## 2018-02-05 DIAGNOSIS — E669 Obesity, unspecified: Secondary | ICD-10-CM

## 2018-02-05 DIAGNOSIS — G4733 Obstructive sleep apnea (adult) (pediatric): Secondary | ICD-10-CM

## 2018-02-08 ENCOUNTER — Ambulatory Visit: Payer: PPO | Admitting: Diagnostic Neuroimaging

## 2018-02-09 ENCOUNTER — Telehealth: Payer: Self-pay

## 2018-02-09 ENCOUNTER — Other Ambulatory Visit: Payer: Self-pay | Admitting: Neurology

## 2018-02-09 DIAGNOSIS — G4733 Obstructive sleep apnea (adult) (pediatric): Secondary | ICD-10-CM

## 2018-02-09 NOTE — Procedures (Signed)
PATIENT'S NAME:  [name], [name] DOB:      February 17, 1951      MR#:    [id number]     DATE OF RECORDING: 02/05/2018 REFERRING M.D.: Dr. Leta Baptist,           PCP: [name] MD Study Performed:   CPAP  Titration HISTORY:  67 year old man with a history of arthritis, type 2 diabetes, glaucoma, hypertension, hyperlipidemia, low back pain, peripheral neuropathy, legal blindness, coronary artery disease, congestive heart failure and obesity, who presents for a CPAP titration. His diagnostic Polysomnogram on 12/29/2017 revealed moderate to severe obstructive sleep apnea, with a total AHI of 20.1/hour, REM AHI of 19.1/hour, supine AHI of 42.5/hour and O2 nadir of 82%. The patient endorsed the Epworth Sleepiness Scale at 20.   The patient's weight 240 pounds with a height of 70.5 (inches), resulting in a BMI of 34.4 kg/m2.  CURRENT MEDICATIONS: Amlodipine, Aspirin, Atorvastatin, Carvedilol, Cyanocobalamin, Ezetimibe, Fluticasone, Gabapentin, Insulin, Isosorbide, Janumet, Levocetirizine, Losartan, Omega-3, Pantoprazole, Ranexa, Tasulosin, Tramadol and Nitroglycerin.  PROCEDURE:  This is a multichannel digital polysomnogram utilizing the SomnoStar 11.2 system.  Electrodes and sensors were applied and monitored per AASM Specifications.   EEG, EOG, Chin and Limb EMG, were sampled at 200 Hz.  ECG, Snore and Nasal Pressure, Thermal Airflow, Respiratory Effort, CPAP Flow and Pressure, Oximetry was sampled at 50 Hz. Digital video and audio were recorded.      The patient was fitted with a large Eson 2 nasal mask. CPAP was initiated at 5 cmH20 with heated humidity per AASM standards and pressure was advanced to 6 cmH20 because of hypopneas, apneas and desaturations.  At a PAP pressure of 6 cmH20, there was a reduction of the AHI to 2.5 with supine REM sleep achieved and O2 nadir of 90%.   Lights Out was at 22:25 and Lights On at 04:56. Total recording time (TRT) was 391.5 minutes, with a total sleep time (TST) of 356.5  minutes. The patient's sleep latency was 23.5 minutes. REM latency was 89.5 minutes, which is normal. The sleep efficiency was 91.1 %.    SLEEP ARCHITECTURE: WASO (Wake after sleep onset) was 23.5 minutes with mild sleep fragmentation noted. There were 21.5 minutes in Stage N1, 295 minutes Stage N2, 0 minutes Stage N3 and 40 minutes in Stage REM.  The percentage of Stage N1 was 6.%, Stage N2 was 82.7%, which is markedly increased, stage N3 was absent, and Stage R (REM sleep) was 11.2%, which is reduced. The arousals were noted as: 33 were spontaneous, 0 were associated with PLMs, 7 were associated with respiratory events.  Audio and video analysis did not show any abnormal or unusual movements, behaviors, phonations or vocalizations. The patient took no bathroom breaks. The EKG was in keeping with normal sinus rhythm (NSR).  RESPIRATORY ANALYSIS:  There was a total of 29 respiratory events: 1 obstructive apneas, 24 central apneas and 2 mixed apneas with a total of 27 apneas and an apnea index (AI) of 4.5 /hour. There were 2 hypopneas with a hypopnea index of .3/hour. The patient also had 0 respiratory event related arousals (RERAs).      The total APNEA/HYPOPNEA INDEX  (AHI) was 4.9 /hour and the total RESPIRATORY DISTURBANCE INDEX was 4.9 .hour  1 events occurred in REM sleep and 28 events in NREM. The REM AHI was 1.5 /hour versus a non-REM AHI of 5.3 /hour.  The patient spent 256.5 minutes of total sleep time in the supine position and 100 minutes in  non-supine. The supine AHI was 3.3, versus a non-supine AHI of 9.0.  OXYGEN SATURATION & C02:  The baseline 02 saturation was 98%, with the lowest being 85%. Time spent below 89% saturation equaled 4 minutes.  PERIODIC LIMB MOVEMENTS: The patient had a total of 0 Periodic Limb Movements. The Periodic Limb Movement (PLM) index was 0 and the PLM Arousal index was 0 /hour.  Post-study, the patient indicated that sleep was better than usual.    IMPRESSION:   1. Obstructive Sleep Apnea (OSA) 2. Dysfunctions associated with sleep stages or arousal from sleep   RECOMMENDATIONS:   1. This study demonstrates near-complete resolution of the patient's obstructive sleep apnea with CPAP therapy. I will, therefore, start the patient on home CPAP treatment at a pressure of 6 cm via large nasal pillows with heated humidity. The patient should be reminded to be fully compliant with PAP therapy to improve sleep related symptoms and decrease long term cardiovascular risks. The patient should be reminded, that it may take up to 3 months to get fully used to using PAP with all planned sleep. The earlier full compliance is achieved, the better long term compliance tends to be. Please note that untreated obstructive sleep apnea carries additional perioperative morbidity. Patients with significant obstructive sleep apnea should receive perioperative PAP therapy and the surgeons and particularly the anesthesiologist should be informed of the diagnosis and the severity of the sleep disordered breathing. 2. This study shows sleep fragmentation and abnormal sleep stage percentages; these are nonspecific findings and per se do not signify an intrinsic sleep disorder or a cause for the patient's sleep-related symptoms. Causes include (but are not limited to) the first night effect of the sleep study, circadian rhythm disturbances, medication effect or an underlying mood disorder or medical problem.  3. The patient should be cautioned not to drive (patient does not drive), work at heights, or operate dangerous or heavy equipment when tired or sleepy. Review and reiteration of good sleep hygiene measures should be pursued with any patient. 4. The patient will be seen in follow-up by Dr. Rexene Alberts at [facility] for discussion of the test results and further management strategies. The referring provider will be notified of the test results.   I certify that I have reviewed the  entire raw data recording prior to the issuance of this report in accordance with the Standards of Accreditation of the American Academy of Sleep Medicine (AASM)   Star Age, MD, PhD Diplomat, American Board of Psychiatry and Neurology (Neurology and Sleep Medicine)

## 2018-02-09 NOTE — Progress Notes (Signed)
Patient referred by Dr. Leta Baptist, seen by me on 11/19/17, diagnostic PSG on 12/29/17. Patient had a CPAP titration study on 02/05/18.  Please call and inform patient that I have entered an order for treatment with positive airway pressure (PAP) treatment for obstructive sleep apnea (OSA). He did well during the latest sleep study with CPAP. We will, therefore, arrange for a machine for home use through a DME (durable medical equipment) company of His choice; and I will see the patient back in follow-up in about 10 weeks. Please also explain to the patient that I will be looking out for compliance data, which can be downloaded from the machine (stored on an SD card, that is inserted in the machine) or via remote access through a modem, that is built into the machine. At the time of the followup appointment we will discuss sleep study results and how it is going with PAP treatment at home. Please advise patient to bring His machine at the time of the first FU visit, even though this is cumbersome. Bringing the machine for every visit after that will likely not be needed, but often helps for the first visit to troubleshoot if needed. Please re-enforce the importance of compliance with treatment and the need for [place] to monitor compliance data - often an insurance requirement and actually good feedback for the patient as far as how they are doing.  Also remind patient, that any interim PAP machine or mask issues should be first addressed with the DME company, as they can often help better with technical and mask fit issues. Please ask if patient has a preference regarding DME company.  Please also make sure, the patient has a follow-up appointment with me in about 10 weeks from the setup date, thanks. May see one of our nurse practitioners if needed for proper timing of the FU appointment.  Please fax or rout report to the referring provider. Thanks,   Star Age, MD, PhD [facility])

## 2018-02-09 NOTE — Telephone Encounter (Signed)
I called pt. I advised pt that Dr. Rexene Alberts reviewed their sleep study results and found that pt did well with the cpap during his latest sleep study. Dr. Rexene Alberts recommends that pt start a cpap at home. I reviewed PAP compliance expectations with the pt. Pt is agreeable to starting a CPAP. I advised pt that an order will be sent to a DME, [facility], and [employer] will call the pt within about one week after they file with the pt's insurance. [facility] will show the pt how to use the machine, fit for masks, and troubleshoot the CPAP if needed. A follow up appt was made for insurance purposes with Dr. Rexene Alberts on May 16th, 2019 at 11:30am. Pt verbalized understanding to arrive 15 minutes early and bring their CPAP. A letter with all of this information in it will be sent to the pt's mychart as a reminder. I verified with the pt that the address we have on file is correct. Pt asked that [facility] come to their house to show them how to use the cpap, and I advised them that I can include this request in pt's referral. Pt verbalized understanding of results. Pt had no questions at this time but was encouraged to call back if questions arise.

## 2018-02-09 NOTE — Telephone Encounter (Signed)
-----   Message from Star Age, MD sent at 02/09/2018  8:21 AM EST ----- Patient referred by Dr. Leta Baptist, seen by me on 11/19/17, diagnostic PSG on 12/29/17. Patient had a CPAP titration study on 02/05/18.  Please call and inform patient that I have entered an order for treatment with positive airway pressure (PAP) treatment for obstructive sleep apnea (OSA). He did well during the latest sleep study with CPAP. We will, therefore, arrange for a machine for home use through a DME (durable medical equipment) company of His choice; and I will see the patient back in follow-up in about 10 weeks. Please also explain to the patient that I will be looking out for compliance data, which can be downloaded from the machine (stored on an SD card, that is inserted in the machine) or via remote access through a modem, that is built into the machine. At the time of the followup appointment we will discuss sleep study results and how it is going with PAP treatment at home. Please advise patient to bring His machine at the time of the first FU visit, even though this is cumbersome. Bringing the machine for every visit after that will likely not be needed, but often helps for the first visit to troubleshoot if needed. Please re-enforce the importance of compliance with treatment and the need for [place] to monitor compliance data - often an insurance requirement and actually good feedback for the patient as far as how they are doing.  Also remind patient, that any interim PAP machine or mask issues should be first addressed with the DME company, as they can often help better with technical and mask fit issues. Please ask if patient has a preference regarding DME company.  Please also make sure, the patient has a follow-up appointment with me in about 10 weeks from the setup date, thanks. May see one of our nurse practitioners if needed for proper timing of the FU appointment.  Please fax or rout report to the referring provider.  Thanks,   Star Age, MD, PhD [facility])

## 2018-02-10 ENCOUNTER — Encounter: Payer: Self-pay | Admitting: Diagnostic Neuroimaging

## 2018-02-10 ENCOUNTER — Ambulatory Visit (INDEPENDENT_AMBULATORY_CARE_PROVIDER_SITE_OTHER): Payer: PPO | Admitting: Diagnostic Neuroimaging

## 2018-02-10 VITALS — BP 121/66 | HR 71 | Wt 238.0 lb

## 2018-02-10 DIAGNOSIS — R413 Other amnesia: Secondary | ICD-10-CM

## 2018-02-10 DIAGNOSIS — E669 Obesity, unspecified: Secondary | ICD-10-CM | POA: Diagnosis not present

## 2018-02-10 DIAGNOSIS — H547 Unspecified visual loss: Secondary | ICD-10-CM | POA: Diagnosis not present

## 2018-02-10 DIAGNOSIS — G4733 Obstructive sleep apnea (adult) (pediatric): Secondary | ICD-10-CM

## 2018-02-10 DIAGNOSIS — E1142 Type 2 diabetes mellitus with diabetic polyneuropathy: Secondary | ICD-10-CM | POA: Diagnosis not present

## 2018-02-10 NOTE — Progress Notes (Signed)
[facility]  PATIENT: [name] DOB: 12-25-1950  REFERRING CLINICIAN: Alona Bene, MD HISTORY FROM: patient  REASON FOR VISIT: existing patient   HISTORICAL  CHIEF COMPLAINT:  Chief Complaint  Patient presents with  . Tremors    rm 4, brother - Juanda Crumble, "my hands still feel like rubber; feels like a current/tingling is going through both arms down to hands-constant  . diabetic polyneuropathy    HISTORY OF PRESENT ILLNESS:   UPDATE (02/10/18, VRP): Since last visit, doing about the same. Tolerating meds. No alleviating or aggravating factors. Continues with pain in fingers and hands. Now has OSA diagnosis and planning to get mask fitted.   UPDATE (10/06/17, VRP): Since last visit, doing fair. More issues with left hand tremor and spasms. Intermittent spasms, but able to stop with voluntary effort. Happens few times per week, minutes at a time. Has started ~ 6 months ago. Gait and balance still poor. Some more short term memory loss issues. Not that active. Stays home a lot. No alleviating or aggravating factors.   PRIOR HPI (09/12/16): 67 year old male here for evaluation of lower extremity numbness him a low back pain, right leg pain. Patient has hypertension, diabetes, hyperglycemia, heart disease. Patient has had 2 lumbar spine surgeries (2002 and 2016) and cervical spine surgery in 2002. 1998 patient was diagnosed with diabetes. Around this time he was having severe eye problems ultimately leading to bilateral blindness. In early 2000 patient had gradual onset and progressive low back pain. Ultimately this continued in spite of conservative management, and resulted in patient opting for lumbar spine surgery. Unfortunately when this occurred in 4008, this was complicated by osteomyelitis including cervical epidural abscess. This required additional treatment and surgery. Ever since that time patient has had difficulty with his balance and walking. He had to "learn how  to walk" again. Over time patient's low back pain returned, and patient underwent another lumbar spine surgery on 10/26/2015. Following surgery patient's lower extremities, balance and walking significantly worsened. Patient had follow-up MRI in June 2017 which showed some progression of degenerative spine disease. Patient was referred to me for EMG nerve conduction study by his orthopedic surgeon, and the study showed severe peripheral neuropathy without definite electrodiagnostic evidence of lumbar radiculopathy, but study was limited due to poor relaxation and lumbar paraspinal evaluation. Over the years patient has diabetes has been improving. In retrospect he has had some numbness and pain in his hands for number of years as well as numbness in his feet for past 3 years. Patient referred to me by PCP for further evaluation. Patient denies any burning, pins and needles pain in his toes or feet. He has a "cardboard" sensation in his feet. He has some low back pain which is intermittent, worse when he stands up and tries to walk.    REVIEW OF SYSTEMS: Full 14 system review of systems performed and negative with exception of: As per history of present illness.  ALLERGIES: Allergies  Allergen Reactions  . Furosemide     pancreatitis  . Amlodipine Swelling    LE EDEMA  . Diamox [Acetazolamide] Itching    hives  . Lisinopril Rash    HOME MEDICATIONS: Outpatient Medications Prior to Visit  Medication Sig Dispense Refill  . aspirin 81 MG EC tablet Take 1 tablet (81 mg total) by mouth daily. 90 tablet 1  . atorvastatin (LIPITOR) 80 MG tablet Take 1 tablet (80 mg total) by mouth daily at 6 PM. 90 tablet 1  .  Blood Glucose Calibration (ONETOUCH VERIO) SOLN 1 Act by In Vitro route 2 (two) times daily. 1 each 11  . Blood Glucose Monitoring Suppl (PRODIGY VOICE BLOOD GLUCOSE) W/DEVICE KIT Use to check blood sugars twice a day Dx e11.9 1 kit 0  . carvedilol (COREG) 12.5 MG tablet TAKE 1 TABLET BY  MOUTH TWICE A DAY WITH A MEAL 180 tablet 3  . Continuous Blood Gluc Receiver (FREESTYLE LIBRE READER) DEVI 1 Act by Does not apply route 3 (three) times daily with meals. 1 Device 1  . Continuous Blood Gluc Sensor (FREESTYLE LIBRE SENSOR SYSTEM) MISC USE AS DIRECTED 3 TIMES A DAY 3 each 1  . cyanocobalamin 2000 MCG tablet Take 1 tablet (2,000 mcg total) by mouth daily. 90 tablet 3  . ezetimibe (ZETIA) 10 MG tablet Take 1 tablet (10 mg total) by mouth daily. 90 tablet 3  . fluticasone (FLONASE) 50 MCG/ACT nasal spray Place 2 sprays into both nostrils daily. 48 g 3  . gabapentin (NEURONTIN) 300 MG capsule TAKE 1 CAPSULE BY MOUTH 3 TIMES A DAY 270 capsule 2  . glucose blood (ONETOUCH VERIO) test strip Use BID to check BS. DX E11.9 100 each 12  . insulin NPH-regular Human (NOVOLIN 70/30) (70-30) 100 UNIT/ML injection 85 U SQ QAM and 45 U SQ QPM 12 vial 3  . irbesartan (AVAPRO) 150 MG tablet Take 1 tablet (150 mg total) by mouth daily. 90 tablet 1  . isosorbide mononitrate (IMDUR) 120 MG 24 hr tablet TAKE 1 TABLET (120 MG TOTAL) BY MOUTH DAILY. 90 tablet 3  . JANUMET 50-1000 MG tablet TAKE 1 TABLET BY MOUTH TWICE A DAY WITH A MEAL 180 tablet 1  . levocetirizine (XYZAL) 5 MG tablet Take 1 tablet (5 mg total) by mouth every evening. 90 tablet 3  . losartan (COZAAR) 50 MG tablet Take 50 mg by mouth daily.  3  . omega-3 acid ethyl esters (LOVAZA) 1 g capsule Take 2 capsules (2 g total) by mouth 2 (two) times daily. 360 capsule 2  . pantoprazole (PROTONIX) 40 MG tablet Take 1 tablet (40 mg total) by mouth 2 (two) times daily. 180 tablet 3  . PRODIGY LANCETS 28G MISC Use to check blood sugars twice a day Dx e11.9 100 each 3  . RANEXA 500 MG 12 hr tablet TAKE 1 TABLET TWICE A DAY 180 tablet 1  . tamsulosin (FLOMAX) 0.4 MG CAPS capsule Take 1 capsule (0.4 mg total) by mouth daily. 15 capsule 1  . traMADol (ULTRAM) 50 MG tablet TAKE 1 TABLET BY MOUTH EVERY 8 HOURS AS NEEDED 270 tablet 1  . nitroGLYCERIN  (NITROSTAT) 0.4 MG SL tablet Place 1 tablet (0.4 mg total) under the tongue every 5 (five) minutes as needed for chest pain. 25 tablet 3   No facility-administered medications prior to visit.     PAST MEDICAL HISTORY: Past Medical History:  Diagnosis Date  . Arthritis    "mild arthritis in hip"  . CAD (coronary artery disease)   . CHF (congestive heart failure) ([place])   . Complication of anesthesia    "lungs filled up with fluid"   . Diabetes mellitus    Type II  . Glaucoma   . Heart murmur   . HTN (hypertension)   . Hypercholesterolemia   . LBP (low back pain)   . Legally blind   . Peripheral neuropathy   . Sleep apnea   . Staph infection    "from back surgery"  . Trigger finger   .  Ulcer   . Wears glasses    "to protect cornea" - legally blind    PAST SURGICAL HISTORY: Past Surgical History:  Procedure Laterality Date  . CHOLECYSTECTOMY  09/15/2012   Procedure: LAPAROSCOPIC CHOLECYSTECTOMY WITH INTRAOPERATIVE CHOLANGIOGRAM;  Surgeon: Pedro Earls, MD;  Location: [facility];  Service: General;  Laterality: N/A;  . EYE SURGERY Bilateral    cataracts  . INGUINAL HERNIA REPAIR     right  . LUMBAR FUSION    . LUMBAR LAMINECTOMY/DECOMPRESSION MICRODISCECTOMY N/A 10/26/2015   Procedure: RIGHT AND CENTRAL LUMBAR LAMINECTOMY L3-4, RIGHT L5-S1 LATERAL RECESS DECOMPRESSION;  Surgeon: Jessy Oto, MD;  Location: [facility];  Service: Orthopedics;  Laterality: N/A;  . NECK SURGERY    . peptic ulcer dz surgery  pt was in his 20s   bleeding ulcer.   . Prosthetic Cornea placement, Right eye  2007   [place] Right 10/26/2015   Procedure: RELEASE TRIGGER FINGER RIGHT THUMB;  Surgeon: Jessy Oto, MD;  Location: [facility];  Service: Orthopedics;  Laterality: Right;    FAMILY HISTORY: Family History  Problem Relation Age of Onset  . Breast cancer Mother   . Colon cancer Mother   . Hypertension Mother   . Diabetes Mother   . Hypertension Father   . Colon  cancer Other        Elevated Risk for  . Diabetes Sister   . Diabetes Brother     SOCIAL HISTORY:  Social History   Socioeconomic History  . Marital status: Married    Spouse name: Not on file  . Number of children: 3  . Years of education: 43  . Highest education level: Not on file  Social Needs  . Financial resource strain: Not on file  . Food insecurity - worry: Not on file  . Food insecurity - inability: Not on file  . Transportation needs - medical: Not on file  . Transportation needs - non-medical: Not on file  Occupational History  . Occupation: disabled    Comment: blind  Tobacco Use  . Smoking status: Never Smoker  . Smokeless tobacco: Never Used  Substance and Sexual Activity  . Alcohol use: No  . Drug use: No  . Sexual activity: Not Currently  Other Topics Concern  . Not on file  Social History Narrative   Occupation: disabled, blind   Married   Regular Exercise-no   Lives at home with his wife.   Right-handed.   2-3 cups caffeine per day.     PHYSICAL EXAM  GENERAL EXAM/CONSTITUTIONAL: Vitals:  Vitals:   02/10/18 1429  BP: 121/66  Pulse: 71  Weight: 238 lb (108 kg)   Body mass index is 33.67 kg/m. No exam data present  Patient is in no distress; well developed, nourished and groomed; neck is supple  CARDIOVASCULAR:  Examination of carotid arteries is normal; no carotid bruits  Regular rate and rhythm, no murmurs  Examination of peripheral vascular system by observation and palpation is normal  EYES:  RIGHT EYE SHRUNKEN AND POST-SURGICAL (BLIND)  LEFT EYE HAS MINIMAL LIGHT PERCEPTION, NO PUPILLARY REACTION  MUSCULOSKELETAL:  Gait, strength, tone, movements noted in Neurologic exam below  NEUROLOGIC: MENTAL STATUS:  No flowsheet data found.  awake, alert, oriented to person, place and time  recent and remote memory intact  normal attention and concentration  language fluent, comprehension intact, naming intact,   fund  of knowledge appropriate  CRANIAL NERVE:   2nd -  SEE ABOVE EYE EXAM; POST-SURGICAL  2nd, 3rd, 4th, 6th - pupils equal and reactive to light, visual fields full to confrontation, extraocular muscles intact, no nystagmus  5th - facial sensation symmetric  7th - facial strength symmetric  8th - hearing intact  9th - palate elevates symmetrically, uvula midline  11th - shoulder shrug symmetric  12th - tongue protrusion midline  MOTOR:   normal bulk and tone, full strength in the BUE EXCEPT BILATERAL TRICEPS, FINGER ABDUCTION, FINGER FLEXION (3-4) AND BLE (HF 3-4, KE/KF 4, RIGHT DF 1, LEFT DF 3)  SENSORY:   normal and symmetric to light touch; ABSENT VIB AT TOES AND ANKLES  COORDINATION:   fine finger movements normal  REFLEXES:   deep tendon reflexes TRACE IN BUE; ABSENT IN BLE  GAIT/STATION:   USES SINGLE POINT CANE; SLOW TO RISE; SHORT STEPS; VERY UNSTEADY    DIAGNOSTIC DATA (LABS, IMAGING, TESTING) - I reviewed patient records, labs, notes, testing and imaging myself where available.  Lab Results  Component Value Date   WBC 4.6 11/24/2017   HGB 11.4 (L) 11/24/2017   HCT 35.5 (L) 11/24/2017   MCV 73.3 (L) 11/24/2017   PLT 199.0 11/24/2017      Component Value Date/Time   NA 138 11/24/2017 1116   K 4.1 11/24/2017 1116   CL 102 11/24/2017 1116   CO2 27 11/24/2017 1116   GLUCOSE 125 (H) 11/24/2017 1116   BUN 14 11/24/2017 1116   CREATININE 0.96 11/24/2017 1116   CALCIUM 8.5 11/24/2017 1116   PROT 7.0 11/24/2017 1116   ALBUMIN 4.1 11/24/2017 1116   AST 13 11/24/2017 1116   ALT 13 11/24/2017 1116   ALKPHOS 53 11/24/2017 1116   BILITOT 0.8 11/24/2017 1116   GFRNONAA >60 07/09/2017 2033   GFRAA >60 07/09/2017 2033   Lab Results  Component Value Date   CHOL 123 11/24/2017   HDL 29.70 (L) 11/24/2017   LDLCALC 69 11/24/2017   LDLDIRECT 82.7 04/04/2013   TRIG 123.0 11/24/2017   CHOLHDL 4 11/24/2017   Lab Results  Component Value Date   HGBA1C 7.3  11/24/2017   Lab Results  Component Value Date   JOINOMVE72 094 07/22/2017   Lab Results  Component Value Date   TSH 1.73 11/24/2017   04/04/01 MRI lumbar spine 1.    BROAD BASED PROTRUSION AT L4-5 MORE PRONOUNCED TOWARDS THE RIGHT WHICH IN COMBINATION WITH FACET DEGENERATIVE CHANGE RESULTS IN RIGHT FORAMINAL AND LATERAL RECESS STENOSIS AT L4-5.  THIS MAY WELL BE THE CLINICALLY RELEVANT ABNORMALITY IN THIS PATIENT AND COULD EXPLAIN RIGHT LEG RADICULAR SYMPTOMATOLOGY. 2.    LEFT FORAMINAL STENOSIS AT L5-S1 LARGELY DUE TO DEGENERATIVE FACET DISEASE BUT ALSO CONTRIBUTED TO BY BULGING DISK MATERIAL.  THIS COULD AFFECT THE LEFT L5 NERVE ROOT.  08/08/01 MRI lumbar spine 1.  EVIDENCE OF POSTOPERATIVE INFECTION INVOLVING THE OPERATIVE APPROACH AND INVOLVING THE DEEP SPACES WITH EPIDURAL INFLAMMATORY CHANGES EXTENDING FROM L3TO S1.  THE EPIDURAL INFLAMMATORY CHANGES ARE REALLY QUITE PROMINENT AND SHOW COMPRESSIVE EFFECT UPON THE THECAL [place].  THERE ARE NOT LARGE NONENHANCING COMPONENTS TO SUGGEST PUS FILLED ABSCESSES BUT THIS IS PROBABLY DIFFUSE PHLEGMONOUS INFLAMMATION. TWO VIEW LUMBAR SPINE THE PATIENT HAS HAD HEMILAMINECTOMY ON THE RIGHT AT L4-5.  THERE IS SCOLIOSIS CONVEX TO THE LEFT. I DO NOT SEE BONY DESTRUCTIVE CHANGES ASSOCIATED WITH INFECTION.  09/04/01 MRI cervical spine 1.  STATUS POST DECOMPRESSIVE LAMINECTOMY FROM C3 THROUGH C6 WITH IMPROVED INTERSPINAL EPIDURAL ABSCESS. 2.  EXTENSIVE DORSAL SPINOUS INFLAMMATORY CHANGE  WORRISOME FOR A SOFT TISSUE ABSCESS/MYOSITIS/PHLEGMON MEASURING APPROXIMATELY 2 X 5 CM IN CROSS SECTION AND EXTENDING OVER A CEPHALOCAUDAL LENGTH OF 4 CM. 3.  STABLE PARACENTRAL DISC PROTRUSION C5-6 CENTRAL AND TO THE LEFT AND STABLE SMALL SOFT CENTRAL DISC PROTRUSION AT C6-7.  11/12/01 MRI cervical spine - NEGATIVE FOR CERVICAL EPIDURAL ABSCESS.  STATUS POST DECOMPRESSIVE LAMINECTOMY. DISC DEGENERATION IS SIMILAR TO THE PRIOR STUDY.   06/01/16 MRI lumbar spine [I reviewed  images myself and agree with interpretation. -VRP]  1. Lumbar spondylosis and degenerative disc disease causing prominent impingement at L5-S1; moderate impingement at L3-4; and mild impingement at L2-3, as detailed above. Postoperative findings at multiple levels. Some of the right-sided findings at L3-4 are improved compared to the prior exam due to interval laminectomy and partial facetectomy. Findings at L2- 3 and L5-S1 are worsened from prior.  06/01/16 MRI brain [I reviewed images myself and agree with interpretation. -VRP]  1. No acute intracranial abnormality. 2. Minimal chronic small vessel ischemic disease.  08/01/16 EMG/NCS 1. Severe axonal sensorimotor polyneuropathy with active and chronic denervation. 2. Superimposed bilateral L5-S1 lumbar radiculopathies is possible given the clinical context and abnormal EMG findings in the lower extremities. Although no definite lumbar paraspinal muscle spontaneous activity was noted, the absence of this activity does not totally exclude lumbar radiculopathy. In addition, even if abnormal lumbar spine spontaneous activity were to be seen, in the setting of prior lumbar surgery this would not necessarily confirm ongoing lumbar radiculopathy problems.   10/22/17 EEG - normal  10/29/17 MRI brain  - Abnormal MRI scan of the brain showing mild changes of chronic microvascular ischemia and generalized cerebral atrophy and paranasal sinus inflammation which appears to have progressed slightly compared with previous MRI scan dated 11/08/2015.      ASSESSMENT AND PLAN  67 y.o. year old male here with long history of diabetes, status post bilateral blindness, with progressive lower extremity weakness, numbness, pain, low back pain. Overall patient's lower extremities are affected by severe diabetic peripheral neuropathy as well as concomitant lumbar degenerative spine disease and lumbar polyradiculopathies, status post 2 lumbar spine  surgeries.   Dx:  1. Diabetic polyneuropathy associated with type 2 diabetes mellitus ([place])   2. Memory loss   3. OSA (obstructive sleep apnea)   4. Obesity (BMI 30-39.9)   5. Blindness      PLAN:  MEMORY LOSS - likely due to pain, insomnia, inactivity, social withdrawal - encouraged brain healthy activities  DIABETIC NEUROPATHY - continue diabetes treatment - continue gabapentin for neuropathic pain treatment; may increase over time per PCP  LUMBAR RADICULOPATHY / LOW BACK PAIN - follow up with Dr. Louanne Skye or pain mgmt referral for low back pain / lumbar radiculopathy issues, if patient needs long term narcotic medications  Return if symptoms worsen or fail to improve, for return to PCP.    Penni Bombard, MD 5/36/6440, 3:47 PM Certified in Neurology, Neurophysiology and Neuroimaging  [facility] [address], [place], [place] [address] [phone]

## 2018-02-23 ENCOUNTER — Other Ambulatory Visit: Payer: Self-pay | Admitting: Internal Medicine

## 2018-02-23 DIAGNOSIS — I2510 Atherosclerotic heart disease of native coronary artery without angina pectoris: Secondary | ICD-10-CM

## 2018-02-23 DIAGNOSIS — E785 Hyperlipidemia, unspecified: Secondary | ICD-10-CM

## 2018-02-24 ENCOUNTER — Other Ambulatory Visit: Payer: Self-pay | Admitting: Internal Medicine

## 2018-03-04 ENCOUNTER — Encounter: Payer: Self-pay | Admitting: Internal Medicine

## 2018-03-04 ENCOUNTER — Other Ambulatory Visit: Payer: Self-pay | Admitting: Internal Medicine

## 2018-03-04 DIAGNOSIS — Z794 Long term (current) use of insulin: Secondary | ICD-10-CM

## 2018-03-04 DIAGNOSIS — E118 Type 2 diabetes mellitus with unspecified complications: Secondary | ICD-10-CM

## 2018-03-08 MED ORDER — FREESTYLE LIBRE SENSOR SYSTEM MISC: 3 refills | Status: DC

## 2018-03-30 DIAGNOSIS — G4733 Obstructive sleep apnea (adult) (pediatric): Secondary | ICD-10-CM | POA: Diagnosis not present

## 2018-04-01 ENCOUNTER — Other Ambulatory Visit: Payer: Self-pay | Admitting: Physician Assistant

## 2018-04-02 NOTE — Telephone Encounter (Signed)
Pt was started on this 8/17 for GERD per Angelena Form.  Is this something you would like to refill or have the PCP follow it?

## 2018-04-06 ENCOUNTER — Other Ambulatory Visit: Payer: Self-pay

## 2018-04-06 MED ORDER — PANTOPRAZOLE SODIUM 40 MG PO TBEC: 40.0000 mg | DELAYED_RELEASE_TABLET | Freq: Two times a day (BID) | ORAL | 2 refills | Status: DC

## 2018-04-09 NOTE — Telephone Encounter (Signed)
Lets have PCP refill if they feel necessary Candee Furbish, MD

## 2018-04-24 ENCOUNTER — Other Ambulatory Visit: Payer: Self-pay | Admitting: Internal Medicine

## 2018-04-29 ENCOUNTER — Encounter: Payer: Self-pay | Admitting: Internal Medicine

## 2018-04-29 DIAGNOSIS — G4733 Obstructive sleep apnea (adult) (pediatric): Secondary | ICD-10-CM | POA: Diagnosis not present

## 2018-04-30 ENCOUNTER — Telehealth: Payer: Self-pay | Admitting: Internal Medicine

## 2018-04-30 NOTE — Telephone Encounter (Signed)
Received a handicap placard form via fax for patient to be completed. Forms has been placed in providers box to review and sign if he approves.   PCP is out of office until May 10, 2018

## 2018-05-03 ENCOUNTER — Encounter: Payer: Self-pay | Admitting: Neurology

## 2018-05-04 ENCOUNTER — Ambulatory Visit: Payer: PPO | Admitting: Podiatry

## 2018-05-06 ENCOUNTER — Telehealth: Payer: Self-pay | Admitting: Neurology

## 2018-05-06 ENCOUNTER — Encounter: Payer: Self-pay | Admitting: Neurology

## 2018-05-06 ENCOUNTER — Ambulatory Visit (INDEPENDENT_AMBULATORY_CARE_PROVIDER_SITE_OTHER): Payer: PPO | Admitting: Neurology

## 2018-05-06 VITALS — BP 150/77 | HR 80 | Ht 70.0 in | Wt 238.0 lb

## 2018-05-06 DIAGNOSIS — Z9989 Dependence on other enabling machines and devices: Secondary | ICD-10-CM | POA: Diagnosis not present

## 2018-05-06 DIAGNOSIS — G4733 Obstructive sleep apnea (adult) (pediatric): Secondary | ICD-10-CM

## 2018-05-06 NOTE — Telephone Encounter (Signed)
Patient's wife states she will give [place] a call to schedule 6 month f/u w/ np per Dr. Rexene Alberts.

## 2018-05-06 NOTE — Progress Notes (Signed)
Subjective:    Patient ID: [name] is a 67 y.o. male.  HPI     Interim history:   [name] is a 67 year old right-handed gentleman with an underlying medical history of arthritis, type 2 diabetes, glaucoma, hypertension, hyperlipidemia, low back pain, peripheral neuropathy, legal blindness, coronary artery disease, congestive heart failure and obesity, who presents for follow-up consultation of his obstructive sleep apnea after sleep study testing. The patient is accompanied by his wife again today. I first met him on 11/19/2017 at the request of Dr. Leta Baptist, at which time he reported a prior diagnosis of OSA. I suggested we proceed with a sleep study. He had a baseline sleep study, followed by a CPAP titration study. I went over his test results with him in detail today. Baseline sleep study from 12/29/2017 showed moderate to severe obstructive sleep apnea, AHI ranged from 20.1 per hour to 42.5 per hour. Average oxygen saturation was 92%, nadir was 82%. I suggested we proceed with a sleep study with CPAP titration. He had this on 02/05/2018. He did fairly well on CPAP of 6 cm. He was placed on a nasal mask. I prescribed CPAP therapy for home use.  Today, 05/06/2018: I reviewed his CPAP compliance data from 04/04/2018 through 05/03/2018 which is a total of 30 days, during which time he used his CPAP 28 days with percent used days greater than 4 hours at 87%, indicating very good compliance with an average usage of 6 hours and 3 minutes, residual AHI is slightly suboptimal at 6.2 per hour, leak on the low side with the 95th percentile at 4.5 L/m on a pressure of 6 cm with EPR of 2. Residual sleep-disordered breathing appears to be primarily central in nature. He reports doing well. He has adapted well to treatment and actually feels improved. His wife endorses that he seems to be sleeping more soundly.  The patient's allergies, current medications, family history, past medical history, past  social history, past surgical history and problem list were reviewed and updated as appropriate.   Previously:  11/19/2017: (He) was previously diagnosed with obstructive sleep apnea. Prior sleep study results are not available for my review today. I reviewed your office note from 10/06/2017. He has never been on CPAP but was advised to start CPAP therapy in the past. He reports snoring and daytime somnolence. His Epworth sleepiness score is 20 out of 24, fatigue score is 48 out of 63. He is married and lives with his wife. He does not work. He is a nonsmoker and does not use alcohol. He drinks caffeine about 3 servings per day, typically in the form of coffee. Occasional soda. He has 3 sons. He does not have night to night nocturia. He has had rare morning headaches. Bedtime is late, around 1 AM and wakeup time generally around 9 AM. He does not indicate telltale symptoms of restless leg syndrome but is a restless sleeper. He has had back surgeries and nerve damage. He has neuropathy. He moves his legs in his sleep.    His Past Medical History Is Significant For: Past Medical History:  Diagnosis Date  . Arthritis    "mild arthritis in hip"  . CAD (coronary artery disease)   . CHF (congestive heart failure) ([place])   . Complication of anesthesia    "lungs filled up with fluid"   . Diabetes mellitus    Type II  . Glaucoma   . Heart murmur   . HTN (hypertension)   .  Hypercholesterolemia   . LBP (low back pain)   . Legally blind   . Peripheral neuropathy   . Sleep apnea   . Staph infection    "from back surgery"  . Trigger finger   . Ulcer   . Wears glasses    "to protect cornea" - legally blind    His Past Surgical History Is Significant For: Past Surgical History:  Procedure Laterality Date  . CHOLECYSTECTOMY  09/15/2012   Procedure: LAPAROSCOPIC CHOLECYSTECTOMY WITH INTRAOPERATIVE CHOLANGIOGRAM;  Surgeon: Pedro Earls, MD;  Location: [facility];  Service: General;  Laterality: N/A;   . EYE SURGERY Bilateral    cataracts  . INGUINAL HERNIA REPAIR     right  . LUMBAR FUSION    . LUMBAR LAMINECTOMY/DECOMPRESSION MICRODISCECTOMY N/A 10/26/2015   Procedure: RIGHT AND CENTRAL LUMBAR LAMINECTOMY L3-4, RIGHT L5-S1 LATERAL RECESS DECOMPRESSION;  Surgeon: Jessy Oto, MD;  Location: [facility];  Service: Orthopedics;  Laterality: N/A;  . NECK SURGERY    . peptic ulcer dz surgery  pt was in his 20s   bleeding ulcer.   . Prosthetic Cornea placement, Right eye  2007   [place] Right 10/26/2015   Procedure: RELEASE TRIGGER FINGER RIGHT THUMB;  Surgeon: Jessy Oto, MD;  Location: [facility];  Service: Orthopedics;  Laterality: Right;    His Family History Is Significant For: Family History  Problem Relation Age of Onset  . Breast cancer Mother   . Colon cancer Mother   . Hypertension Mother   . Diabetes Mother   . Hypertension Father   . Colon cancer Other        Elevated Risk for  . Diabetes Sister   . Diabetes Brother     His Social History Is Significant For: Social History   Socioeconomic History  . Marital status: Married    Spouse name: Not on file  . Number of children: 3  . Years of education: 66  . Highest education level: Not on file  Occupational History  . Occupation: disabled    Comment: blind  Social Needs  . Financial resource strain: Not on file  . Food insecurity:    Worry: Not on file    Inability: Not on file  . Transportation needs:    Medical: Not on file    Non-medical: Not on file  Tobacco Use  . Smoking status: Never Smoker  . Smokeless tobacco: Never Used  Substance and Sexual Activity  . Alcohol use: No  . Drug use: No  . Sexual activity: Not Currently  Lifestyle  . Physical activity:    Days per week: Not on file    Minutes per session: Not on file  . Stress: Not on file  Relationships  . Social connections:    Talks on phone: Not on file    Gets together: Not on file    Attends religious  service: Not on file    Active member of club or organization: Not on file    Attends meetings of clubs or organizations: Not on file    Relationship status: Not on file  Other Topics Concern  . Not on file  Social History Narrative   Occupation: disabled, blind   Married   Regular Exercise-no   Lives at home with his wife.   Right-handed.   2-3 cups caffeine per day.    His Allergies Are:  Allergies  Allergen Reactions  . Furosemide  pancreatitis  . Amlodipine Swelling    LE EDEMA  . Diamox [Acetazolamide] Itching    hives  . Lisinopril Rash  :   His Current Medications Are:  Outpatient Encounter Medications as of 05/06/2018  Medication Sig  . aspirin 81 MG EC tablet Take 1 tablet (81 mg total) by mouth daily.  Marland Kitchen atorvastatin (LIPITOR) 80 MG tablet TAKE 1 TABLET (80 MG TOTAL) BY MOUTH DAILY AT 6 PM.  . carvedilol (COREG) 12.5 MG tablet TAKE 1 TABLET BY MOUTH TWICE A DAY WITH A MEAL  . Continuous Blood Gluc Receiver (FREESTYLE LIBRE READER) DEVI 1 Act by Does not apply route 3 (three) times daily with meals.  . Continuous Blood Gluc Sensor (FREESTYLE LIBRE SENSOR SYSTEM) MISC Use 1 sensor every 14 days, then replace with new one.  . ezetimibe (ZETIA) 10 MG tablet Take 1 tablet (10 mg total) by mouth daily.  . fluticasone (FLONASE) 50 MCG/ACT nasal spray Place 2 sprays into both nostrils daily.  Marland Kitchen gabapentin (NEURONTIN) 300 MG capsule TAKE 1 CAPSULE BY MOUTH 3 TIMES A DAY  . glucose blood (ONETOUCH VERIO) test strip Use BID to check BS. DX E11.9  . insulin NPH-regular Human (NOVOLIN 70/30) (70-30) 100 UNIT/ML injection 85 U SQ QAM and 45 U SQ QPM  . Insulin Syringe-Needle U-100 (B-D INS SYR ULTRAFINE 1CC/30G) 30G X 1/2" 1 ML MISC USE AS DIRECTED WITH INSULIN VIALS  . irbesartan (AVAPRO) 150 MG tablet Take 1 tablet (150 mg total) by mouth daily.  . isosorbide mononitrate (IMDUR) 120 MG 24 hr tablet TAKE 1 TABLET (120 MG TOTAL) BY MOUTH DAILY.  Marland Kitchen JANUMET 50-1000 MG tablet  TAKE 1 TABLET BY MOUTH TWICE A DAY WITH A MEAL  . losartan (COZAAR) 50 MG tablet Take 50 mg by mouth daily.  Marland Kitchen omega-3 acid ethyl esters (LOVAZA) 1 g capsule TAKE 2 CAPSULES (2 G TOTAL) BY MOUTH 2 (TWO) TIMES DAILY.  . pantoprazole (PROTONIX) 40 MG tablet Take 1 tablet (40 mg total) by mouth 2 (two) times daily.  Marland Kitchen PRODIGY LANCETS 28G MISC Use to check blood sugars twice a day Dx e11.9  . RANEXA 500 MG 12 hr tablet TAKE 1 TABLET BY MOUTH TWICE A DAY  . tamsulosin (FLOMAX) 0.4 MG CAPS capsule Take 1 capsule (0.4 mg total) by mouth daily.  . traMADol (ULTRAM) 50 MG tablet TAKE 1 TABLET BY MOUTH EVERY 8 HOURS AS NEEDED  . nitroGLYCERIN (NITROSTAT) 0.4 MG SL tablet Place 1 tablet (0.4 mg total) under the tongue every 5 (five) minutes as needed for chest pain.  . [DISCONTINUED] Blood Glucose Calibration (ONETOUCH VERIO) SOLN 1 Act by In Vitro route 2 (two) times daily.  . [DISCONTINUED] Blood Glucose Monitoring Suppl (PRODIGY VOICE BLOOD GLUCOSE) W/DEVICE KIT Use to check blood sugars twice a day Dx e11.9  . [DISCONTINUED] cyanocobalamin 2000 MCG tablet Take 1 tablet (2,000 mcg total) by mouth daily.  . [DISCONTINUED] levocetirizine (XYZAL) 5 MG tablet Take 1 tablet (5 mg total) by mouth every evening.   No facility-administered encounter medications on file as of 05/06/2018.   :  Review of Systems:  Out of a complete 14 point review of systems, all are reviewed and negative with the exception of these symptoms as listed below: Review of Systems  Neurological:       Pt presents today to discuss his cppa. Pt reports that his cpap is going well.    Objective:  Neurological Exam  Physical Exam Physical Examination:  Vitals:   05/06/18 1137  BP: (!) 150/77  Pulse: 80    General Examination: The patient is a very pleasant 67 y.o. male in no acute distress. He appears well-developed and well-nourished and well groomed. Good spirits.   HEENT: Normocephalic, atraumatic, visually impaired,  R eye deformity, s/p R corneal tx. Extraocular tracking is fair with the L eye. Normal smooth pursuit is noted. Hearing is grossly intact. Face is symmetric with normal facial animation and normal facial sensation. Speech is clear with no dysarthria noted. There is no hypophonia. There is no lip, neck/head, jaw or voice tremor. Neck with FROM. Oropharynx exam reveals: mild mouth dryness, adequate dental hygiene with dentures, and multiple missing teeth on the bottom, moderate airway crowdingI. Tongue protrudes centrally and palate elevates symmetrically. Tonsils are absent and s/p uvulectomy.    Chest: Clear to auscultation without wheezing, rhonchi or crackles noted.  Heart: S1+S2+0, regular and normal without murmurs, rubs or gallops noted.   Abdomen: Soft, non-tender and non-distended with normal bowel sounds appreciated on auscultation.  Extremities: There is edema in the R ankle.   Skin: Warm and dry without trophic changes noted.  Musculoskeletal: exam reveals no obvious joint deformities, tenderness or joint swelling or erythema.   Neurologically:  Mental status: The patient is awake, alert and oriented in all 4 spheres. Her immediate and remote memory, attention, language skills and fund of knowledge are appropriate. There is no evidence of aphasia, agnosia, apraxia or anomia. Speech is clear with normal prosody and enunciation. Thought process is linear. Mood is normal and affect is normal.  Cranial nerves II - XII are as described above under HEENT exam.  Motor exam: thin bulk, normal strength on L, R leg weakness, R foot drop. Romberg is not doable safely. Reflexes are 1+ on the UEs, absent in the LEs. Fine motor skills and coordination: globally mildly impaired.  Cerebellar testing: No dysmetria or intention tremor on finger to nose testing. Heel to shin is unremarkable bilaterally. There is no truncal or gait ataxia.  Sensory exam: intact to light touch in the upper and lower  extremities.  Gait, station and balance: He stands with difficulty. Walks with a cane and holds onto wife.               Assessment and Plan:  In summary, [name] is a very pleasant 67 year old male with an underlying medical history of arthritis, type 2 diabetes, glaucoma, hypertension, hyperlipidemia, low back pain, peripheral neuropathy, legal blindness, coronary artery disease, congestive heart failure and obesity, who presents for follow-up consultation of his moderate to severe obstructive sleep apnea after sleep studies testing and establishing treatment with CPAP at home. We talked about his sleep study results in detail and also reviewed his compliance data. He is commended for strict adherence. He is doing well with CPAP, has adapted quite well to treatment and is motivated to continue, endorses improvement in sleep quality and daytime tiredness, sleep consolidation. I encouraged him to continue to be fully compliant with CPAP. I would like for him to return in about 6 months to see one of our nurse practitioners.I answered all their questions today and the patient and his wife were in agreement. I spent 25 minutes in total face-to-face time with the patient, more than 50% of which was spent in counseling and coordination of care, reviewing test results, reviewing medication and discussing or reviewing the diagnosis of OSA, its prognosis and treatment options. Pertinent laboratory and imaging test  results that were available during this visit with the patient were reviewed by me and considered in my medical decision making (see chart for details).

## 2018-05-06 NOTE — Patient Instructions (Addendum)
The address for [facility] is: [address] [phone]  Please continue using your CPAP regularly. While your insurance requires that you use CPAP at least 4 hours each night on 70% of the nights, I recommend, that you not skip any nights and use it throughout the night if you can. Getting used to CPAP and staying with the treatment long term does take time and patience and discipline. Untreated obstructive sleep apnea when it is moderate to severe can have an adverse impact on cardiovascular health and raise her risk for heart disease, arrhythmias, hypertension, congestive heart failure, stroke and diabetes. Untreated obstructive sleep apnea causes sleep disruption, nonrestorative sleep, and sleep deprivation. This can have an impact on your day to day functioning and cause daytime sleepiness and impairment of cognitive function, memory loss, mood disturbance, and problems focussing. Using CPAP regularly can improve these symptoms.  Your set up date was 03/30/18.  You have done a good job using your CPAP so far.  Keep up the good work! We will see you back in 6 months for sleep apnea check up, and if you continue to do well on CPAP I will see you once a year thereafter.  Please see the nurse practitioner in about 6 months.   You have done well on CPAP, please be sure to change your filter every 1-2 months, your mask about every 2-3 months, hose about 6 months, humidifier chamber about yearly. Some restrictions are imposed by her insurance carrier with regard to how frequently you can get certain supplies.

## 2018-05-10 NOTE — Telephone Encounter (Signed)
Forms have been signed, copy sent to scan.   Patient informed and will pick up tomorrow (5/21) at his appointment.

## 2018-05-11 ENCOUNTER — Ambulatory Visit (INDEPENDENT_AMBULATORY_CARE_PROVIDER_SITE_OTHER): Payer: PPO | Admitting: Internal Medicine

## 2018-05-11 ENCOUNTER — Encounter: Payer: Self-pay | Admitting: Internal Medicine

## 2018-05-11 ENCOUNTER — Ambulatory Visit (INDEPENDENT_AMBULATORY_CARE_PROVIDER_SITE_OTHER): Payer: PPO | Admitting: *Deleted

## 2018-05-11 VITALS — BP 120/60 | HR 69 | Resp 18 | Ht 70.0 in | Wt 240.0 lb

## 2018-05-11 VITALS — BP 120/60 | HR 69 | Temp 97.4°F | Ht 70.0 in | Wt 240.0 lb

## 2018-05-11 DIAGNOSIS — I10 Essential (primary) hypertension: Secondary | ICD-10-CM

## 2018-05-11 DIAGNOSIS — Z0000 Encounter for general adult medical examination without abnormal findings: Secondary | ICD-10-CM

## 2018-05-11 DIAGNOSIS — D510 Vitamin B12 deficiency anemia due to intrinsic factor deficiency: Secondary | ICD-10-CM | POA: Diagnosis not present

## 2018-05-11 DIAGNOSIS — N400 Enlarged prostate without lower urinary tract symptoms: Secondary | ICD-10-CM | POA: Diagnosis not present

## 2018-05-11 DIAGNOSIS — E118 Type 2 diabetes mellitus with unspecified complications: Secondary | ICD-10-CM | POA: Diagnosis not present

## 2018-05-11 LAB — POCT GLYCOSYLATED HEMOGLOBIN (HGB A1C): Hemoglobin A1C: 6.8 % — AB (ref 4.0–5.6)

## 2018-05-11 MED ORDER — CYANOCOBALAMIN 1000 MCG/ML IJ SOLN
1000.0000 ug | Freq: Once | INTRAMUSCULAR | Status: AC
  Administered 2018-05-11: 1000 ug via INTRAMUSCULAR

## 2018-05-11 MED ORDER — CYANOCOBALAMIN 2000 MCG PO TABS: 2000.0000 ug | ORAL_TABLET | Freq: Every day | ORAL | 1 refills | Status: DC

## 2018-05-11 NOTE — Patient Instructions (Addendum)
[email or web address] or down load app on smart phone  Aunt Berenice Primas website lists multiple social resources for individuals such as: food, health, money, house hold goods, transit, medical supplies, job training and legal services.  Continue doing brain stimulating activities (puzzles, reading, adult coloring books, staying active) to keep memory sharp.   Continue to eat heart healthy diet (full of fruits, vegetables, whole grains, lean protein, water--limit salt, fat, and sugar intake) and increase physical activity as tolerated.   [name] , Thank you for taking time to come for your Medicare Wellness Visit. I appreciate your ongoing commitment to your health goals. Please review the following plan we discussed and let me know if I can assist you in the future.   These are the goals we discussed: Goals    . Patient Stated     I want to do more things out of the house when the car is fixed like going to church, visiting friends and enjoying life.        This is a list of the screening recommended for you and due dates:  Health Maintenance  Topic Date Due  .  Hepatitis C: One time screening is recommended by Center for Disease Control  (CDC) for  adults born from [birth year] through 1967.   09/06/1951  . Eye exam for diabetics  05/06/2018  . Hemoglobin A1C  05/25/2018  . Flu Shot  07/22/2018  . Complete foot exam   07/22/2018  . Pneumonia vaccines (2 of 2 - PPSV23) 07/23/2019  . Tetanus Vaccine  10/22/2020  . Colon Cancer Screening  10/30/2020   It is important to avoid accidents which may result in broken bones.  Here are a few ideas on how to make your home safer so you will be less likely to trip or fall.  1. Use nonskid mats or non slip strips in your shower or tub, on your bathroom floor and around sinks.  If you know that you have spilled water, wipe it up! 2. In the bathroom, it is important to have properly installed grab bars on the walls or on the edge of the tub.  Towel racks are  NOT strong enough for you to hold onto or to pull on for support. 3. Stairs and hallways should have enough light.  Add lamps or night lights if you need ore light. 4. It is good to have handrails on both sides of the stairs if possible.  Always fix broken handrails right away. 5. It is important to see the edges of steps.  Paint the edges of outdoor steps white so you can see them better.  Put colored tape on the edge of inside steps. 6. Throw-rugs are dangerous because they can slide.  Removing the rugs is the best idea, but if they must stay, add adhesive carpet tape to prevent slipping. 7. Do not keep things on stairs or in the halls.  Remove small furniture that blocks the halls as it may cause you to trip.  Keep telephone and electrical cords out of the way where you walk. 8. Always were sturdy, rubber-soled shoes for good support.  Never wear just socks, especially on the stairs.  Socks may cause you to slip or fall.  Do not wear full-length housecoats as you can easily trip on the bottom.  9. Place the things you use the most on the shelves that are the easiest to reach.  If you use a stepstool, make sure it is in good condition.  If you feel unsteady, DO NOT climb, ask for help. 10. If a health professional advises you to use a cane or walker, do not be ashamed.  These items can keep you from falling and breaking your bones.

## 2018-05-11 NOTE — Progress Notes (Signed)
Subjective:  Patient ID: [name], male    DOB: 01/29/1951  Age: 67 y.o. MRN: [id number]  CC: Diabetes   HPI [name] presents for f/up - He complains of persistent lower extremity edema.  When I last saw him about this I asked him to stop taking amlodipine but he and his wife tell me that he is still taking it.  He tells me his blood pressure and blood sugars have been well controlled.  He denies any recent episodes of palpitation, DOE, CP, or SOB.  He has a history of B12 deficiency with anemia but he tells me he is not receiving a B12 supplement.  Outpatient Medications Prior to Visit  Medication Sig Dispense Refill  . aspirin 81 MG EC tablet Take 1 tablet (81 mg total) by mouth daily. 90 tablet 1  . atorvastatin (LIPITOR) 80 MG tablet TAKE 1 TABLET (80 MG TOTAL) BY MOUTH DAILY AT 6 PM. 90 tablet 1  . carvedilol (COREG) 12.5 MG tablet TAKE 1 TABLET BY MOUTH TWICE A DAY WITH A MEAL 180 tablet 3  . Continuous Blood Gluc Receiver (FREESTYLE LIBRE READER) DEVI 1 Act by Does not apply route 3 (three) times daily with meals. 1 Device 1  . Continuous Blood Gluc Sensor (FREESTYLE LIBRE SENSOR SYSTEM) MISC Use 1 sensor every 14 days, then replace with new one. 6 each 3  . ezetimibe (ZETIA) 10 MG tablet Take 1 tablet (10 mg total) by mouth daily. 90 tablet 3  . fluticasone (FLONASE) 50 MCG/ACT nasal spray Place 2 sprays into both nostrils daily. 48 g 3  . gabapentin (NEURONTIN) 300 MG capsule TAKE 1 CAPSULE BY MOUTH 3 TIMES A DAY 270 capsule 2  . glucose blood (ONETOUCH VERIO) test strip Use BID to check BS. DX E11.9 100 each 12  . insulin NPH-regular Human (NOVOLIN 70/30) (70-30) 100 UNIT/ML injection 85 U SQ QAM and 45 U SQ QPM 12 vial 3  . Insulin Syringe-Needle U-100 (B-D INS SYR ULTRAFINE 1CC/30G) 30G X 1/2" 1 ML MISC USE AS DIRECTED WITH INSULIN VIALS 200 each 3  . irbesartan (AVAPRO) 150 MG tablet Take 1 tablet (150 mg total) by mouth daily. 90 tablet 1  . isosorbide mononitrate  (IMDUR) 120 MG 24 hr tablet TAKE 1 TABLET (120 MG TOTAL) BY MOUTH DAILY. 90 tablet 3  . JANUMET 50-1000 MG tablet TAKE 1 TABLET BY MOUTH TWICE A DAY WITH A MEAL 180 tablet 1  . losartan (COZAAR) 50 MG tablet Take 50 mg by mouth daily.  3  . omega-3 acid ethyl esters (LOVAZA) 1 g capsule TAKE 2 CAPSULES (2 G TOTAL) BY MOUTH 2 (TWO) TIMES DAILY. 360 capsule 1  . pantoprazole (PROTONIX) 40 MG tablet Take 1 tablet (40 mg total) by mouth 2 (two) times daily. 180 tablet 2  . PRODIGY LANCETS 28G MISC Use to check blood sugars twice a day Dx e11.9 100 each 3  . RANEXA 500 MG 12 hr tablet TAKE 1 TABLET BY MOUTH TWICE A DAY 180 tablet 1  . tamsulosin (FLOMAX) 0.4 MG CAPS capsule Take 1 capsule (0.4 mg total) by mouth daily. 15 capsule 1  . traMADol (ULTRAM) 50 MG tablet TAKE 1 TABLET BY MOUTH EVERY 8 HOURS AS NEEDED 270 tablet 1  . nitroGLYCERIN (NITROSTAT) 0.4 MG SL tablet Place 1 tablet (0.4 mg total) under the tongue every 5 (five) minutes as needed for chest pain. 25 tablet 3   No facility-administered medications prior to visit.  ROS Review of Systems  Constitutional: Negative.  Negative for appetite change, diaphoresis, fatigue and fever.  HENT: Negative.   Eyes: Negative for visual disturbance.  Respiratory: Negative for cough, chest tightness, shortness of breath and wheezing.   Cardiovascular: Positive for leg swelling. Negative for chest pain and palpitations.  Gastrointestinal: Negative for abdominal pain, constipation, diarrhea, nausea and vomiting.  Endocrine: Negative.  Negative for polydipsia, polyphagia and polyuria.  Genitourinary: Negative.  Negative for difficulty urinating.  Musculoskeletal: Negative.  Negative for arthralgias and myalgias.  Skin: Negative.   Neurological: Negative.  Negative for dizziness, weakness, light-headedness and numbness.  Hematological: Negative for adenopathy. Does not bruise/bleed easily.  Psychiatric/Behavioral: Negative.     Objective:  BP  120/60 (BP Location: Left Arm, Patient Position: Sitting, Cuff Size: Large)   Pulse 69   Temp (!) 97.4 F (36.3 C) (Oral)   Ht 5\' 10"  (1.778 m)   Wt 240 lb (108.9 kg)   SpO2 97%   BMI 34.44 kg/m   BP Readings from Last 3 Encounters:  05/11/18 120/60  05/11/18 120/60  05/06/18 (!) 150/77    Wt Readings from Last 3 Encounters:  05/11/18 240 lb (108.9 kg)  05/11/18 240 lb (108.9 kg)  05/06/18 238 lb (108 kg)    Physical Exam  Constitutional: He is oriented to person, place, and time. No distress.  HENT:  Mouth/Throat: Oropharynx is clear and moist. No oropharyngeal exudate.  Eyes: Conjunctivae are normal. No scleral icterus.  Neck: Normal range of motion. Neck supple. No JVD present. No thyromegaly present.  Cardiovascular: Normal rate, regular rhythm and normal heart sounds. Exam reveals no gallop.  No murmur heard. Pulmonary/Chest: Effort normal and breath sounds normal. No respiratory distress. He has no wheezes. He has no rales.  Abdominal: Soft. Bowel sounds are normal. He exhibits no mass. There is no hepatosplenomegaly. There is no tenderness.  Musculoskeletal: He exhibits edema (1+ pitting edema in BLE). He exhibits no tenderness or deformity.  Neurological: He is alert and oriented to person, place, and time.  Skin: Skin is warm and dry. He is not diaphoretic.  Vitals reviewed.   Lab Results  Component Value Date   WBC 4.6 11/24/2017   HGB 11.4 (L) 11/24/2017   HCT 35.5 (L) 11/24/2017   PLT 199.0 11/24/2017   GLUCOSE 125 (H) 11/24/2017   CHOL 123 11/24/2017   TRIG 123.0 11/24/2017   HDL 29.70 (L) 11/24/2017   LDLDIRECT 82.7 04/04/2013   LDLCALC 69 11/24/2017   ALT 13 11/24/2017   AST 13 11/24/2017   NA 138 11/24/2017   K 4.1 11/24/2017   CL 102 11/24/2017   CREATININE 0.96 11/24/2017   BUN 14 11/24/2017   CO2 27 11/24/2017   TSH 1.73 11/24/2017   PSA 1.99 04/09/2016   INR 0.95 08/17/2010   HGBA1C 6.8 (A) 05/11/2018   MICROALBUR 0.6 07/22/2017     Mr Brain Wo Contrast  Result Date: 10/30/2017  [facility] [address], [place], [place] [address] [phone] NEUROIMAGING REPORT STUDY DATE: 10/29/2017 PATIENT NAME: Elijah THOMURE DOB: 1951-02-02 MRN: [id number] ORDERING CLINICIAN: Dr Leta Baptist CLINICAL HISTORY:  33 year patient with memory loss COMPARISON FILMS: MRI Brain 11/08/2015 EXAM: MRI Brain wo TECHNIQUE: MRI of the brain without contrast was obtained utilizing 5 mm axial slices with T1, T2, T2 flair, T2 star gradient echo and diffusion weighted views.  T1 sagittal and T2 coronal views were obtained. CONTRAST:none IMAGING SITE:  Imaging FINDINGS: The brain parenchyma shows  mild changes of chronic microvascular ischemia and age-appropriate mild generalized cerebral atrophy.  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  The paranasal sinuses show mild chronic inflammatory changes. No abnormal lesions are seen on diffusion-weighted views to suggest acute ischemia. The cortical sulci, fissures and cisterns are normal in size and appearance. Lateral, third and fourth ventricle are normal in size and appearance. No extra-axial fluid collections are seen. No evidence of mass effect or midline shift.  On sagittal views the posterior fossa, pituitary gland and corpus callosum are unremarkable. No evidence of intracranial hemorrhage on gradient-echo views. The orbits and their contents, paranasal sinuses and calvarium are unremarkable.  Intracranial flow voids are present.   Abnormal MRI scan of the brain showing mild changes of chronic microvascular ischemia and generalized cerebral atrophy and paranasal sinus inflammation which appears to have progressed slightly compared with previous MRI scan dated 11/08/2015 INTERPRETING PHYSICIAN: PRAMOD SETHI, MD Certified in  Neuroimaging by [place] [employer] and [employer]     [place]:   Krist was seen today for diabetes.  Diagnoses and all orders for this visit:  Type 2 diabetes mellitus with complication, without long-term current use of insulin ([place])- His A1c is at 6.8%.  His blood sugars are adequately well controlled. -     Basic metabolic panel; Future -     Cancel: Hemoglobin A1c; Future -     POCT glycosylated hemoglobin (Hb A1C)  HYPERTENSION, BENIGN ESSENTIAL- His blood pressure is well controlled.  I will monitor his electrolytes and renal function.  I have asked him to stop taking amlodipine. -     Basic metabolic panel; Future  Benign prostatic hyperplasia without lower urinary tract symptoms- I will monitor his PSA to see if there is concern for prostate cancer. -     PSA; Future  Vitamin B12 deficiency anemia due to intrinsic factor deficiency- Will start parenteral and oral B12 replacement therapy. -     CBC with Differential/Platelet; Future -     cyanocobalamin 2000 MCG tablet; Take 1 tablet (2,000 mcg total) by mouth daily. -     cyanocobalamin ((VITAMIN B-12)) injection 1,000 mcg   I am having Dontarious B. England start on cyanocobalamin. I am also having him maintain his tamsulosin, glucose blood, PRODIGY LANCETS 28G, JANUMET, ezetimibe, nitroGLYCERIN, fluticasone, FREESTYLE LIBRE READER, aspirin, carvedilol, isosorbide mononitrate, insulin NPH-regular Human, irbesartan, traMADol, gabapentin, losartan, atorvastatin, omega-3 acid ethyl esters, Insulin Syringe-Needle U-100, FREESTYLE LIBRE SENSOR SYSTEM, pantoprazole, and RANEXA. We administered cyanocobalamin.  Meds ordered this encounter  Medications  . cyanocobalamin 2000 MCG tablet    Sig: Take 1 tablet (2,000 mcg total) by mouth daily.    Dispense:  90 tablet    Refill:  1  . cyanocobalamin ((VITAMIN B-12)) injection 1,000 mcg     Follow-up: No follow-ups on file.  [name], MD

## 2018-05-11 NOTE — Progress Notes (Addendum)
Subjective:   [name] is a 67 y.o. male who presents for Medicare Annual/Subsequent preventive examination.  Review of Systems:  No ROS.  Medicare Wellness Visit. Additional risk factors are reflected in the social history.  Cardiac Risk Factors include: advanced age (>37men, >59 women);diabetes mellitus;dyslipidemia;male gender;hypertension Sleep patterns: feels rested on waking, gets up 1 times nightly to void and sleeps 8-9 hours nightly.    Home Safety/Smoke Alarms: Feels safe in home. Smoke alarms in place.  Living environment; residence and Adult nurse: apartment, equipment: Hydrologist, Type: Tub Surveyor, quantity, wheelchair, no firearms. Seat Belt Safety/Bike Helmet: Wears seat belt.     Objective:    Vitals: There were no vitals taken for this visit.  There is no height or weight on file to calculate BMI.  Advanced Directives 05/11/2018 07/09/2017 07/09/2017 07/26/2016 04/10/2016 04/09/2016 10/23/2015  Does Patient Have a Medical Advance Directive? No No No No Yes No No  Type of Advance Directive - - - - Press photographer;Living will - -  Does patient want to make changes to medical advance directive? Yes (ED - Information included in AVS) - - - - - -  Copy of Healthcare Power of Attorney in Chart? - - - - Yes - -  Would patient like information on creating a medical advance directive? - Yes (Inpatient - patient requests chaplain consult to create a medical advance directive) - - No - patient declined information Yes - Educational materials given No - patient declined information  Pre-existing out of facility DNR order (yellow form or pink MOST form) - - - - - - -    Tobacco Social History   Tobacco Use  Smoking Status Never Smoker  Smokeless Tobacco Never Used     Counseling given: Not Answered   Past Medical History:  Diagnosis Date  . Arthritis    "mild arthritis in hip"  . CAD (coronary artery disease)   . CHF (congestive heart failure) ([place])   .  Complication of anesthesia    "lungs filled up with fluid"   . Diabetes mellitus    Type II  . Glaucoma   . Heart murmur   . HTN (hypertension)   . Hypercholesterolemia   . LBP (low back pain)   . Legally blind   . Peripheral neuropathy   . Sleep apnea   . Staph infection    "from back surgery"  . Trigger finger   . Ulcer   . Wears glasses    "to protect cornea" - legally blind   Past Surgical History:  Procedure Laterality Date  . CHOLECYSTECTOMY  09/15/2012   Procedure: LAPAROSCOPIC CHOLECYSTECTOMY WITH INTRAOPERATIVE CHOLANGIOGRAM;  Surgeon: Pedro Earls, MD;  Location: [facility];  Service: General;  Laterality: N/A;  . EYE SURGERY Bilateral    cataracts  . INGUINAL HERNIA REPAIR     right  . LUMBAR FUSION    . LUMBAR LAMINECTOMY/DECOMPRESSION MICRODISCECTOMY N/A 10/26/2015   Procedure: RIGHT AND CENTRAL LUMBAR LAMINECTOMY L3-4, RIGHT L5-S1 LATERAL RECESS DECOMPRESSION;  Surgeon: Jessy Oto, MD;  Location: [facility];  Service: Orthopedics;  Laterality: N/A;  . NECK SURGERY    . peptic ulcer dz surgery  pt was in his 20s   bleeding ulcer.   . Prosthetic Cornea placement, Right eye  2007   [place] Right 10/26/2015   Procedure: RELEASE TRIGGER FINGER RIGHT THUMB;  Surgeon: Jessy Oto, MD;  Location: [facility];  Service: Orthopedics;  Laterality: Right;   Family History  Problem Relation Age of Onset  . Breast cancer Mother   . Colon cancer Mother   . Hypertension Mother   . Diabetes Mother   . Hypertension Father   . Colon cancer Other        Elevated Risk for  . Diabetes Sister   . Diabetes Brother    Social History   Socioeconomic History  . Marital status: Married    Spouse name: Not on file  . Number of children: 3  . Years of education: 63  . Highest education level: Not on file  Occupational History  . Occupation: disabled    Comment: blind  Social Needs  . Financial resource strain: Not very hard  . Food insecurity:     Worry: Sometimes true    Inability: Sometimes true  . Transportation needs:    Medical: No    Non-medical: No  Tobacco Use  . Smoking status: Never Smoker  . Smokeless tobacco: Never Used  Substance and Sexual Activity  . Alcohol use: No  . Drug use: No  . Sexual activity: Not Currently  Lifestyle  . Physical activity:    Days per week: 0 days    Minutes per session: 0 min  . Stress: Not at all  Relationships  . Social connections:    Talks on phone: More than three times a week    Gets together: More than three times a week    Attends religious service: More than 4 times per year    Active member of club or organization: Yes    Attends meetings of clubs or organizations: More than 4 times per year    Relationship status: Married  Other Topics Concern  . Not on file  Social History Narrative   Occupation: disabled, blind   Married   Regular Exercise-no   Lives at home with his wife.   Right-handed.   2-3 cups caffeine per day.    Outpatient Encounter Medications as of 05/11/2018  Medication Sig  . aspirin 81 MG EC tablet Take 1 tablet (81 mg total) by mouth daily.  Marland Kitchen atorvastatin (LIPITOR) 80 MG tablet TAKE 1 TABLET (80 MG TOTAL) BY MOUTH DAILY AT 6 PM.  . carvedilol (COREG) 12.5 MG tablet TAKE 1 TABLET BY MOUTH TWICE A DAY WITH A MEAL  . Continuous Blood Gluc Receiver (FREESTYLE LIBRE READER) DEVI 1 Act by Does not apply route 3 (three) times daily with meals.  . Continuous Blood Gluc Sensor (FREESTYLE LIBRE SENSOR SYSTEM) MISC Use 1 sensor every 14 days, then replace with new one.  . ezetimibe (ZETIA) 10 MG tablet Take 1 tablet (10 mg total) by mouth daily.  . fluticasone (FLONASE) 50 MCG/ACT nasal spray Place 2 sprays into both nostrils daily.  Marland Kitchen gabapentin (NEURONTIN) 300 MG capsule TAKE 1 CAPSULE BY MOUTH 3 TIMES A DAY  . glucose blood (ONETOUCH VERIO) test strip Use BID to check BS. DX E11.9  . insulin NPH-regular Human (NOVOLIN 70/30) (70-30) 100 UNIT/ML injection  85 U SQ QAM and 45 U SQ QPM  . Insulin Syringe-Needle U-100 (B-D INS SYR ULTRAFINE 1CC/30G) 30G X 1/2" 1 ML MISC USE AS DIRECTED WITH INSULIN VIALS  . irbesartan (AVAPRO) 150 MG tablet Take 1 tablet (150 mg total) by mouth daily.  . isosorbide mononitrate (IMDUR) 120 MG 24 hr tablet TAKE 1 TABLET (120 MG TOTAL) BY MOUTH DAILY.  Marland Kitchen JANUMET 50-1000 MG tablet TAKE 1 TABLET BY MOUTH TWICE  A DAY WITH A MEAL  . losartan (COZAAR) 50 MG tablet Take 50 mg by mouth daily.  . nitroGLYCERIN (NITROSTAT) 0.4 MG SL tablet Place 1 tablet (0.4 mg total) under the tongue every 5 (five) minutes as needed for chest pain.  Marland Kitchen omega-3 acid ethyl esters (LOVAZA) 1 g capsule TAKE 2 CAPSULES (2 G TOTAL) BY MOUTH 2 (TWO) TIMES DAILY.  . pantoprazole (PROTONIX) 40 MG tablet Take 1 tablet (40 mg total) by mouth 2 (two) times daily.  Marland Kitchen PRODIGY LANCETS 28G MISC Use to check blood sugars twice a day Dx e11.9  . RANEXA 500 MG 12 hr tablet TAKE 1 TABLET BY MOUTH TWICE A DAY  . tamsulosin (FLOMAX) 0.4 MG CAPS capsule Take 1 capsule (0.4 mg total) by mouth daily.  . traMADol (ULTRAM) 50 MG tablet TAKE 1 TABLET BY MOUTH EVERY 8 HOURS AS NEEDED   No facility-administered encounter medications on file as of 05/11/2018.     Activities of Daily Living In your present state of health, do you have any difficulty performing the following activities: 05/11/2018 07/09/2017  Hearing? N N  Vision? Y Y  Comment - pt blind  Difficulty concentrating or making decisions? N Y  Walking or climbing stairs? Y Y  Dressing or bathing? Y Y  Doing errands, shopping? Tempie Donning  Preparing Food and eating ? Y -  Using the Toilet? N -  In the past six months, have you accidently leaked urine? N -  Do you have problems with loss of bowel control? N -  Managing your Medications? Y -  Managing your Finances? Y -  Housekeeping or managing your Housekeeping? Y -  Some recent data might be hidden    Patient Care Team: [name], MD as PCP -  General Star Age, MD as Attending Physician (Neurology) Gardiner Barefoot, DPM as Consulting Physician (Podiatry)   Assessment:   This is a routine wellness examination for [place]. Physical assessment deferred to PCP.   Exercise Activities and Dietary recommendations Current Exercise Habits: The patient does not participate in regular exercise at present(chair exercises provided) Diet (meal preparation, eat out, water intake, caffeinated beverages, dairy products, fruits and vegetables): in general, a "healthy" diet  , well balanced   Reviewed heart healthy and diabetic diet, encouraged patient to increase daily water intake.  Goals    . Patient Stated     I want to do more things out of the house when the car is fixed like going to church, visiting friends and enjoying life.        Fall Risk Fall Risk  05/11/2018 11/24/2017 04/10/2016 04/09/2016 04/09/2016  Falls in the past year? Yes No No No No  Number falls in past yr: 1 - - - -  Injury with Fall? No - - - -  Risk for fall due to : Impaired balance/gait;Impaired mobility;Impaired vision - - - -  Follow up Education provided;Falls prevention discussed - - - -    Depression Screen PHQ 2/9 Scores 05/11/2018 11/24/2017 04/10/2016 04/09/2016  PHQ - 2 Score 1 0 0 0    Cognitive Function MMSE - Mini Mental State Exam 05/11/2018  Not completed: Unable to complete       Ad8 score reviewed for issues:  Issues making decisions: no  Less interest in hobbies / activities: no  Repeats questions, stories (family complaining): no  Trouble using ordinary gadgets (microwave, computer, phone):no  Forgets the month or year: no  Mismanaging finances: no  Remembering appts: no  Daily problems with thinking and/or memory: yes Ad8 score is= 1    Immunization History  Administered Date(s) Administered  . Influenza Split 09/12/2012  . Influenza Whole 10/28/2010, 10/28/2011  . Influenza, High Dose Seasonal PF 08/25/2013, 08/21/2015,  12/09/2016  . Influenza, Quadrivalent, Recombinant, Inj, Pf 11/04/2017  . Influenza,inj,Quad PF,6+ Mos 10/25/2014  . Pneumococcal Conjugate-13 10/25/2014  . Pneumococcal Polysaccharide-23 10/28/2010, 09/13/2012  . Pneumococcal-Unspecified 07/22/2014  . Td 10/22/2010   Screening Tests Health Maintenance  Topic Date Due  . Hepatitis C Screening  09/19/1951  . OPHTHALMOLOGY EXAM  05/06/2018  . HEMOGLOBIN A1C  05/25/2018  . INFLUENZA VACCINE  07/22/2018  . FOOT EXAM  07/22/2018  . PNA vac Low Risk Adult (2 of 2 - PPSV23) 07/23/2019  . TETANUS/TDAP  10/22/2020  . COLONOSCOPY  10/30/2020        Plan:     Continue doing brain stimulating activities (puzzles, reading, adult coloring books, staying active) to keep memory sharp.   Continue to eat heart healthy diet (full of fruits, vegetables, whole grains, lean protein, water--limit salt, fat, and sugar intake) and increase physical activity as tolerated.  I have personally reviewed and noted the following in the patient's chart:   . Medical and social history . Use of alcohol, tobacco or illicit drugs  . Current medications and supplements . Functional ability and status . Nutritional status . Physical activity . Advanced directives . List of other physicians . Vitals . Screenings to include cognitive, depression, and falls . Referrals and appointments  In addition, I have reviewed and discussed with patient certain preventive protocols, quality metrics, and best practice recommendations. A written personalized care plan for preventive services as well as general preventive health recommendations were provided to patient.     Michiel Cowboy, RN  05/11/2018  Medical screening examination/treatment/procedure(s) were performed by non-physician practitioner and as supervising physician I was immediately available for consultation/collaboration. I agree with above. [name], MD

## 2018-05-12 ENCOUNTER — Encounter: Payer: Self-pay | Admitting: Internal Medicine

## 2018-05-12 NOTE — Patient Instructions (Signed)

## 2018-05-24 ENCOUNTER — Telehealth: Payer: Self-pay | Admitting: *Deleted

## 2018-05-24 NOTE — Telephone Encounter (Signed)
Called and spoke with patient's wife in regards to assistance with installation of a removable shower head for patient. It was discussed that [employer] would be willing to assist with this issue and permission was asked if nurse could give the church the patient's callback number to arrange this service. Ms. Daniello stated yes and was greatly appreciative for the callback.   Nurse Called the [employer] and left information of how to contact patient.

## 2018-05-25 ENCOUNTER — Ambulatory Visit (INDEPENDENT_AMBULATORY_CARE_PROVIDER_SITE_OTHER): Payer: PPO | Admitting: Podiatry

## 2018-05-25 ENCOUNTER — Encounter: Payer: Self-pay | Admitting: Podiatry

## 2018-05-25 ENCOUNTER — Other Ambulatory Visit: Payer: PPO

## 2018-05-25 DIAGNOSIS — B351 Tinea unguium: Secondary | ICD-10-CM

## 2018-05-25 DIAGNOSIS — E1159 Type 2 diabetes mellitus with other circulatory complications: Secondary | ICD-10-CM | POA: Diagnosis not present

## 2018-05-25 DIAGNOSIS — E1140 Type 2 diabetes mellitus with diabetic neuropathy, unspecified: Secondary | ICD-10-CM | POA: Diagnosis not present

## 2018-05-25 NOTE — Progress Notes (Signed)
Complaint:  Visit Type: Patient returns to my office for continued preventative foot care services. Complaint: Patient states" my nails have grown long and thick and become painful to walk and wear shoes" Patient has been diagnosed with DM with angiopathy.. The patient presents for preventative foot care services. No changes to ROS.  Patient is blind.    Podiatric Exam: Vascular: dorsalis pedis and posterior tibial pulses are not  palpable bilateral. Capillary return is immediate. Temperature gradient is WNL. Skin turgor WNL  Sensorium: Diminished  Semmes Weinstein monofilament test. Normal tactile sensation bilaterally. Nail Exam: Pt has thick disfigured discolored nails with subungual debris noted bilateral entire nail hallux through fifth toenails Ulcer Exam: There is no evidence of ulcer or pre-ulcerative changes or infection. Orthopedic Exam: Muscle tone and strength WNL left foot.  Foot drop right foot.  No limitations in general ROM. No crepitus or effusions noted. Foot type and digits show no abnormalities. Bony prominences are unremarkable. Patient walks with everted right foot.  Diagnoses with right foot drop.  Lack of motion left rearfoot. Skin: No Porokeratosis. No infection or ulcers  Diagnosis:  Onychomycosis, , Pain in right toe, pain in left toes  Treatment & Plan Procedures and Treatment: Consent by patient was obtained for treatment procedures.   Debridement of mycotic and hypertrophic toenails, 1 through 5 bilateral and clearing of subungual debris. No ulceration, no infection noted. Patient was told ro see Benjie Karvonen to see if MMB will help his gait. Return Visit-Office Procedure: Patient instructed to return to the office for a follow up visit 3 months for continued evaluation and treatment.    Gardiner Barefoot DPM

## 2018-05-30 DIAGNOSIS — G4733 Obstructive sleep apnea (adult) (pediatric): Secondary | ICD-10-CM | POA: Diagnosis not present

## 2018-06-01 ENCOUNTER — Other Ambulatory Visit: Payer: PPO

## 2018-06-03 ENCOUNTER — Other Ambulatory Visit: Payer: Self-pay | Admitting: Nurse Practitioner

## 2018-06-03 DIAGNOSIS — I5022 Chronic systolic (congestive) heart failure: Secondary | ICD-10-CM

## 2018-06-13 ENCOUNTER — Other Ambulatory Visit: Payer: Self-pay | Admitting: Internal Medicine

## 2018-06-13 DIAGNOSIS — I2510 Atherosclerotic heart disease of native coronary artery without angina pectoris: Secondary | ICD-10-CM

## 2018-06-29 DIAGNOSIS — G4733 Obstructive sleep apnea (adult) (pediatric): Secondary | ICD-10-CM | POA: Diagnosis not present

## 2018-07-08 ENCOUNTER — Other Ambulatory Visit: Payer: Self-pay | Admitting: Internal Medicine

## 2018-07-16 ENCOUNTER — Telehealth: Payer: Self-pay | Admitting: Internal Medicine

## 2018-07-16 ENCOUNTER — Other Ambulatory Visit: Payer: Self-pay | Admitting: Internal Medicine

## 2018-07-16 DIAGNOSIS — M5416 Radiculopathy, lumbar region: Secondary | ICD-10-CM

## 2018-07-16 DIAGNOSIS — E0849 Diabetes mellitus due to underlying condition with other diabetic neurological complication: Secondary | ICD-10-CM

## 2018-07-16 DIAGNOSIS — M961 Postlaminectomy syndrome, not elsewhere classified: Secondary | ICD-10-CM

## 2018-07-16 NOTE — Telephone Encounter (Signed)
Spouse has called into the office today requesting to schedule patient for Saturday Clinic. Spouse stated that patient is diabetic.  States that his right ankle was swollen and that she has noticed an indention.  Also states that one of the patients toenails are black.   After speaking with Jonelle Sidle (clinical team lead) she consulted with Dr. Sharlet Salina and Jodi Mourning.  They both suggested that the patient go to an Urgent Care today b/c he would probably need an ultrasound done that could not be performed on Saturday Clinic.  I informed the patient of this response.  She stated that she could not get the patient to Urgent Care to be seen today.  I informed spouse to call 911 for EMS pickup to the ED.  Spouse stated she understood.

## 2018-07-19 ENCOUNTER — Encounter: Payer: Self-pay | Admitting: Internal Medicine

## 2018-07-19 ENCOUNTER — Ambulatory Visit (INDEPENDENT_AMBULATORY_CARE_PROVIDER_SITE_OTHER): Payer: PPO | Admitting: Family

## 2018-07-19 ENCOUNTER — Encounter: Payer: Self-pay | Admitting: Family

## 2018-07-19 ENCOUNTER — Other Ambulatory Visit (INDEPENDENT_AMBULATORY_CARE_PROVIDER_SITE_OTHER): Payer: PPO

## 2018-07-19 VITALS — BP 116/62 | HR 86 | Temp 97.7°F | Ht 70.0 in | Wt 234.1 lb

## 2018-07-19 DIAGNOSIS — N400 Enlarged prostate without lower urinary tract symptoms: Secondary | ICD-10-CM

## 2018-07-19 DIAGNOSIS — D510 Vitamin B12 deficiency anemia due to intrinsic factor deficiency: Secondary | ICD-10-CM

## 2018-07-19 DIAGNOSIS — E118 Type 2 diabetes mellitus with unspecified complications: Secondary | ICD-10-CM

## 2018-07-19 DIAGNOSIS — R600 Localized edema: Secondary | ICD-10-CM

## 2018-07-19 DIAGNOSIS — I10 Essential (primary) hypertension: Secondary | ICD-10-CM

## 2018-07-19 LAB — BASIC METABOLIC PANEL
BUN: 14 mg/dL (ref 6–23)
CO2: 27 mEq/L (ref 19–32)
Calcium: 8.8 mg/dL (ref 8.4–10.5)
Chloride: 105 mEq/L (ref 96–112)
Creatinine, Ser: 1.17 mg/dL (ref 0.40–1.50)
GFR: 79.51 mL/min (ref >60.00)
GLUCOSE: 111 mg/dL — AB (ref 70–99)
Potassium: 4.2 mEq/L (ref 3.5–5.1)
Sodium: 140 mEq/L (ref 135–145)

## 2018-07-19 LAB — CBC WITH DIFFERENTIAL/PLATELET
Basophils Absolute: 0.1 10*3/uL (ref 0.0–0.1)
Basophils Relative: 1.1 % (ref 0.0–3.0)
Eosinophils Absolute: 0.2 10*3/uL (ref 0.0–0.7)
Eosinophils Relative: 2.7 % (ref 0.0–5.0)
HEMATOCRIT: 37.8 % — AB (ref 39.0–52.0)
Hemoglobin: 12.1 g/dL — ABNORMAL LOW (ref 13.0–17.0)
LYMPHS PCT: 31.5 % (ref 12.0–46.0)
Lymphs Abs: 1.8 10*3/uL (ref 0.7–4.0)
MCHC: 32.1 g/dL (ref 30.0–36.0)
MCV: 72.8 fl — AB (ref 78.0–100.0)
Monocytes Absolute: 0.5 10*3/uL (ref 0.1–1.0)
Monocytes Relative: 8.2 % (ref 3.0–12.0)
NEUTROS ABS: 3.3 10*3/uL (ref 1.4–7.7)
Neutrophils Relative %: 56.5 % (ref 43.0–77.0)
PLATELETS: 233 10*3/uL (ref 150.0–400.0)
RBC: 5.18 Mil/uL (ref 4.22–5.81)
RDW: 15.5 % (ref 11.5–15.5)
WBC: 5.8 10*3/uL (ref 4.0–10.5)

## 2018-07-19 LAB — PSA: PSA: 2.99 ng/mL (ref 0.10–4.00)

## 2018-07-19 MED ORDER — CYANOCOBALAMIN 2000 MCG PO TABS: 2000.0000 ug | ORAL_TABLET | Freq: Every day | ORAL | 1 refills | Status: DC

## 2018-07-19 NOTE — Progress Notes (Signed)
[name] is a 67 y.o. male with the following history as recorded in EpicCare:  Patient Active Problem List   Diagnosis Date Noted  . Seasonal allergic rhinitis due to pollen 07/22/2017  . Tremor of left hand 07/22/2017  . Chronic diastolic heart failure ([place]) 11/18/2016  . Type 2 diabetes mellitus with complication ([place])   . Spinal stenosis, lumbar region, with neurogenic claudication 10/26/2015    Class: Chronic  . Neurogenic claudication due to lumbar spinal stenosis 10/26/2015  . Sleep apnea 10/24/2015  . Lumbar radiculopathy 03/21/2015  . Non-compliant behavior 03/06/2015  . Diabetic neuropathy ([place]) 02/13/2014  . Postlaminectomy syndrome, lumbar region 02/13/2014  . Claudication in peripheral vascular disease ([place]) 12/28/2013  . Chronic pain syndrome 12/28/2013  . GERD (gastroesophageal reflux disease) 08/25/2013  . Pure hyperglyceridemia 08/25/2013  . Neurogenic bladder 10/01/2012  . Hyperlipidemia LDL goal <70 04/02/2012  . Routine general medical examination at a health care facility 04/02/2012  . B12 deficiency anemia 03/31/2012  . BPH (benign prostatic hyperplasia) 03/31/2012  . Obesity 05/26/2011  . ED (erectile dysfunction) 05/26/2011  . HYPERTENSION, BENIGN ESSENTIAL 12/18/2010  . Coronary atherosclerosis 08/28/2010  . Congestive heart failure ([place]) 08/28/2010    Current Outpatient Medications  Medication Sig Dispense Refill  . aspirin 81 MG EC tablet TAKE 1 TABLET BY MOUTH EVERY DAY 90 tablet 1  . atorvastatin (LIPITOR) 80 MG tablet TAKE 1 TABLET (80 MG TOTAL) BY MOUTH DAILY AT 6 PM. 90 tablet 1  . carvedilol (COREG) 12.5 MG tablet TAKE 1 TABLET BY MOUTH TWICE A DAY WITH A MEAL 180 tablet 1  . Continuous Blood Gluc Receiver (FREESTYLE LIBRE READER) DEVI 1 Act by Does not apply route 3 (three) times daily with meals. 1 Device 1  . Continuous Blood Gluc Sensor (FREESTYLE LIBRE SENSOR SYSTEM) MISC Use 1 sensor every 14 days, then replace with new one. 6 each 3   . cyanocobalamin 2000 MCG tablet Take 1 tablet (2,000 mcg total) by mouth daily. 90 tablet 1  . ezetimibe (ZETIA) 10 MG tablet Take 1 tablet (10 mg total) by mouth daily. 90 tablet 3  . fluticasone (FLONASE) 50 MCG/ACT nasal spray Place 2 sprays into both nostrils daily. 48 g 3  . gabapentin (NEURONTIN) 300 MG capsule TAKE 1 CAPSULE BY MOUTH 3 TIMES A DAY 270 capsule 2  . glucose blood (ONETOUCH VERIO) test strip Use BID to check BS. DX E11.9 100 each 12  . insulin NPH-regular Human (NOVOLIN 70/30) (70-30) 100 UNIT/ML injection 85 U SQ QAM and 45 U SQ QPM 12 vial 3  . Insulin Syringe-Needle U-100 (B-D INS SYR ULTRAFINE 1CC/30G) 30G X 1/2" 1 ML MISC USE AS DIRECTED WITH INSULIN VIALS 200 each 3  . irbesartan (AVAPRO) 150 MG tablet Take 1 tablet (150 mg total) by mouth daily. 90 tablet 1  . isosorbide mononitrate (IMDUR) 120 MG 24 hr tablet TAKE 1 TABLET (120 MG TOTAL) BY MOUTH DAILY. 90 tablet 3  . JANUMET 50-1000 MG tablet TAKE 1 TABLET BY MOUTH TWICE A DAY WITH A MEAL 180 tablet 1  . losartan (COZAAR) 50 MG tablet Take 50 mg by mouth daily.  3  . omega-3 acid ethyl esters (LOVAZA) 1 g capsule TAKE 2 CAPSULES (2 G TOTAL) BY MOUTH 2 (TWO) TIMES DAILY. 360 capsule 1  . pantoprazole (PROTONIX) 40 MG tablet Take 1 tablet (40 mg total) by mouth 2 (two) times daily. 180 tablet 2  . PRODIGY LANCETS 28G MISC Use to check  blood sugars twice a day Dx e11.9 100 each 3  . RANEXA 500 MG 12 hr tablet TAKE 1 TABLET BY MOUTH TWICE A DAY 180 tablet 1  . tamsulosin (FLOMAX) 0.4 MG CAPS capsule Take 1 capsule (0.4 mg total) by mouth daily. 15 capsule 1  . traMADol (ULTRAM) 50 MG tablet TAKE 1 TABLET BY MOUTH EVERY 8 HOURS AS NEEDED 270 tablet 1  . nitroGLYCERIN (NITROSTAT) 0.4 MG SL tablet Place 1 tablet (0.4 mg total) under the tongue every 5 (five) minutes as needed for chest pain. 25 tablet 3   No current facility-administered medications for this visit.     Allergies: Furosemide; Amlodipine; Diamox  [acetazolamide]; and Lisinopril  Past Medical History:  Diagnosis Date  . Arthritis    "mild arthritis in hip"  . CAD (coronary artery disease)   . CHF (congestive heart failure) ([place])   . Complication of anesthesia    "lungs filled up with fluid"   . Diabetes mellitus    Type II  . Glaucoma   . Heart murmur   . HTN (hypertension)   . Hypercholesterolemia   . LBP (low back pain)   . Legally blind   . Peripheral neuropathy   . Sleep apnea   . Staph infection    "from back surgery"  . Trigger finger   . Ulcer   . Wears glasses    "to protect cornea" - legally blind    Past Surgical History:  Procedure Laterality Date  . CHOLECYSTECTOMY  09/15/2012   Procedure: LAPAROSCOPIC CHOLECYSTECTOMY WITH INTRAOPERATIVE CHOLANGIOGRAM;  Surgeon: Pedro Earls, MD;  Location: [facility];  Service: General;  Laterality: N/A;  . EYE SURGERY Bilateral    cataracts  . INGUINAL HERNIA REPAIR     right  . LUMBAR FUSION    . LUMBAR LAMINECTOMY/DECOMPRESSION MICRODISCECTOMY N/A 10/26/2015   Procedure: RIGHT AND CENTRAL LUMBAR LAMINECTOMY L3-4, RIGHT L5-S1 LATERAL RECESS DECOMPRESSION;  Surgeon: Jessy Oto, MD;  Location: [facility];  Service: Orthopedics;  Laterality: N/A;  . NECK SURGERY    . peptic ulcer dz surgery  pt was in his 20s   bleeding ulcer.   . Prosthetic Cornea placement, Right eye  2007   [place] Right 10/26/2015   Procedure: RELEASE TRIGGER FINGER RIGHT THUMB;  Surgeon: Jessy Oto, MD;  Location: [facility];  Service: Orthopedics;  Laterality: Right;    Family History  Problem Relation Age of Onset  . Breast cancer Mother   . Colon cancer Mother   . Hypertension Mother   . Diabetes Mother   . Hypertension Father   . Colon cancer Other        Elevated Risk for  . Diabetes Sister   . Diabetes Brother     Social History   Tobacco Use  . Smoking status: Never Smoker  . Smokeless tobacco: Never Used  Substance Use Topics  . Alcohol use: No     Subjective:  Patient is accompanied by his wife today; concern for chronic swelling in his lower legs- not a new problem for the patient; this was discussed at his last OV with his PCP in May 2019; thought to be Amlodipine related at the time; has stopped the medication and swelling is persisting; wife shows a picture of marked swelling in the right leg this past weekend; on exam today, no swelling noted of the left foot or ankle but wife is very concerned that he will have indentations  from his socks on his calves; patient denies any pain in his calf/ shortness of breath; admits that left leg "always: feels heavy; Has documented neuropathy but only taking his Neurontin once per day; Also complaining of chronic fatigue- during medication review, patient admits he is not taking his B12 as prescribed; did not remember to get the Rx sent in May.  Is also concerned about thick, discoloration of toenail (1st) on right foot- has podiatrist but does not remember being told that he has fungal nail infection.   Objective:  Vitals:   07/19/18 1346  BP: 116/62  Pulse: 86  Temp: 97.7 F (36.5 C)  TempSrc: Oral  SpO2: 96%  Weight: 234 lb 1.3 oz (106.2 kg)  Height: 5\' 10"  (1.778 m)    General: Well developed, well nourished, in no acute distress  Skin : Warm and dry.  Head: Normocephalic and atraumatic  Lungs: Respirations unlabored; clear to auscultation bilaterally without wheeze, rales, rhonchi  CVS exam: normal rate and regular rhythm.  Abdomen: Soft; nontender; nondistended; normoactive bowel sounds; no masses or hepatosplenomegaly  Musculoskeletal: No deformities; no active joint inflammation  Extremities: Pedal edema- right greater than left, no cyanosis, no clubbing; there  Vessels: Symmetric bilaterally  Neurologic: Alert and oriented; speech intact; face symmetrical; moves all extremities well; CNII-XII intact without focal deficit  Assessment:  1. Pedal edema   2. Vitamin B12 deficiency  anemia due to intrinsic factor deficiency     Plan:  1. Update bilateral venous dopplers; may need to refer to vascular specialist; 2. Refill updated- stressed need to take his medication as prescribed.  He will also go to the lab today and get the labs done that his PCP requested be updated at the time of his office visit in May.  Spent 30 minutes with patient; greater than 50% spent in counseling;    No follow-ups on file.  No orders of the defined types were placed in this encounter.   Requested Prescriptions   Signed Prescriptions Disp Refills  . cyanocobalamin 2000 MCG tablet 90 tablet 1    Sig: Take 1 tablet (2,000 mcg total) by mouth daily.

## 2018-07-22 ENCOUNTER — Ambulatory Visit (HOSPITAL_COMMUNITY)
Admission: RE | Admit: 2018-07-22 | Discharge: 2018-07-22 | Disposition: A | Discharge status: Home / Self Care | Payer: PPO | Source: Ambulatory Visit | Attending: Cardiology | Admitting: Cardiology

## 2018-07-22 DIAGNOSIS — R600 Localized edema: Secondary | ICD-10-CM | POA: Insufficient documentation

## 2018-07-25 ENCOUNTER — Other Ambulatory Visit: Payer: Self-pay | Admitting: Internal Medicine

## 2018-07-25 DIAGNOSIS — J301 Allergic rhinitis due to pollen: Secondary | ICD-10-CM

## 2018-07-27 ENCOUNTER — Ambulatory Visit (INDEPENDENT_AMBULATORY_CARE_PROVIDER_SITE_OTHER): Payer: PPO | Admitting: Podiatry

## 2018-07-27 ENCOUNTER — Telehealth: Payer: Self-pay | Admitting: Internal Medicine

## 2018-07-27 ENCOUNTER — Encounter: Payer: Self-pay | Admitting: Podiatry

## 2018-07-27 DIAGNOSIS — B351 Tinea unguium: Secondary | ICD-10-CM | POA: Diagnosis not present

## 2018-07-27 DIAGNOSIS — E1140 Type 2 diabetes mellitus with diabetic neuropathy, unspecified: Secondary | ICD-10-CM

## 2018-07-27 DIAGNOSIS — E1151 Type 2 diabetes mellitus with diabetic peripheral angiopathy without gangrene: Secondary | ICD-10-CM | POA: Diagnosis not present

## 2018-07-27 NOTE — Telephone Encounter (Signed)
Ranexa 500 mg is no longer covered by pt's insurance. Is there an alternative that can be called in?

## 2018-07-27 NOTE — Telephone Encounter (Signed)
Copied from [place] [id number]. Topic: Quick Communication - Rx Refill/Question >> Jul 27, 2018  8:57 AM Ahmed Prima L wrote: Patient called and stated that his pharmacy let him know that his insurance will no longer cover his RANEXA 500 MG 12 hr tablet , patient is requesting something else be called in for him. He is currently out.   [id number] [address] [place] [address]

## 2018-07-27 NOTE — Progress Notes (Signed)
Complaint:  Visit Type: Patient returns to my office for continued preventative foot care services. Complaint: Patient states" my nails have grown long and thick and become painful to walk and wear shoes" Patient has been diagnosed with DM with angiopathy.. The patient presents for preventative foot care services. No changes to ROS.  Patient is blind.    Podiatric Exam: Vascular: dorsalis pedis and posterior tibial pulses are not  palpable bilateral. Capillary return is immediate. Temperature gradient is WNL. Skin turgor WNL  Sensorium: Diminished  Semmes Weinstein monofilament test. Normal tactile sensation bilaterally. Nail Exam: Pt has thick disfigured discolored nails with subungual debris noted bilateral entire nail hallux through fifth toenails Ulcer Exam: There is no evidence of ulcer or pre-ulcerative changes or infection. Orthopedic Exam: Muscle tone and strength WNL left foot.  Foot drop right foot.  No limitations in general ROM. No crepitus or effusions noted. Foot type and digits show no abnormalities. Bony prominences are unremarkable.  Diagnosed with right foot drop.  Lack of motion left rearfoot. Skin: No Porokeratosis. No infection or ulcers  Diagnosis:  Onychomycosis, , Pain in right toe, pain in left toes  Treatment & Plan Procedures and Treatment: Consent by patient was obtained for treatment procedures.   Debridement of mycotic and hypertrophic toenails, 1 through 5 bilateral and clearing of subungual debris. No ulceration, no infection noted. Patient was told to elevate feet due to swelling. Return Visit-Office Procedure: Patient instructed to return to the office for a follow up visit 3 months for continued evaluation and treatment.    Gardiner Barefoot DPM

## 2018-07-27 NOTE — Telephone Encounter (Signed)
He will have to ask his cardiologist about this

## 2018-07-28 ENCOUNTER — Other Ambulatory Visit: Payer: Self-pay | Admitting: Internal Medicine

## 2018-07-28 NOTE — Telephone Encounter (Signed)
Left a detailed message for pt spouse to contact cardiology to change the rx.

## 2018-07-29 DIAGNOSIS — N401 Enlarged prostate with lower urinary tract symptoms: Secondary | ICD-10-CM | POA: Diagnosis not present

## 2018-07-29 DIAGNOSIS — Z125 Encounter for screening for malignant neoplasm of prostate: Secondary | ICD-10-CM | POA: Diagnosis not present

## 2018-07-29 DIAGNOSIS — R3915 Urgency of urination: Secondary | ICD-10-CM | POA: Diagnosis not present

## 2018-07-30 ENCOUNTER — Telehealth: Payer: Self-pay | Admitting: Cardiology

## 2018-07-30 ENCOUNTER — Other Ambulatory Visit: Payer: Self-pay

## 2018-07-30 DIAGNOSIS — G4733 Obstructive sleep apnea (adult) (pediatric): Secondary | ICD-10-CM | POA: Diagnosis not present

## 2018-07-30 MED ORDER — RANOLAZINE ER 500 MG PO TB12: 500.0000 mg | ORAL_TABLET | Freq: Two times a day (BID) | ORAL | 3 refills | Status: DC

## 2018-07-30 NOTE — Telephone Encounter (Signed)
New Message:       Pt c/o medication issue:  1. Name of Medication: RANEXA 500 MG 12 hr tablet  2. How are you currently taking this medication (dosage and times per day)? TAKE 1 TABLET BY MOUTH TWICE A DAY  3. Are you having a reaction (difficulty breathing--STAT)? No  4. What is your medication issue? Pt is needing an alternate medication for this. Pt states they have talked to the insurance company and they need Ranolazine in 500 mg or 1000 mg. The extended release tablets. Pt states he has not taken this medication in 5 days. [facility] [address]

## 2018-07-30 NOTE — Telephone Encounter (Signed)
Spoke to the patient who needed a refill on his Ranexa 500 mg bid.  He stated that his insurance company would not cover the Ranexa and needed to have the generic Ranolazine ordered.

## 2018-08-02 ENCOUNTER — Telehealth: Payer: Self-pay | Admitting: Cardiology

## 2018-08-02 ENCOUNTER — Other Ambulatory Visit: Payer: Self-pay | Admitting: Physician Assistant

## 2018-08-02 ENCOUNTER — Other Ambulatory Visit: Payer: Self-pay | Admitting: Nurse Practitioner

## 2018-08-02 NOTE — Telephone Encounter (Signed)
New Message   Pt c/o medication issue:  1. Name of Medication: ranolazine (RANEXA) 500 MG 12 hr tablet  2. How are you currently taking this medication (dosage and times per day)? Take 1 tablet (500 mg total) by mouth 2 (two) times daily  3. Are you having a reaction (difficulty breathing--STAT)? no  4. What is your medication issue? Pt states he has bee trying to get an alternative or the generic for this medication due to the pharmacy not being able to fill the ranexa. Please call

## 2018-08-03 ENCOUNTER — Telehealth: Payer: Self-pay

## 2018-08-03 MED ORDER — RANOLAZINE ER 500 MG PO TB12: 500.0000 mg | ORAL_TABLET | Freq: Two times a day (BID) | ORAL | 3 refills | Status: DC

## 2018-08-03 NOTE — Telephone Encounter (Signed)
Spoke with patient who is unsure of whether both generic and brand are on back order.  Aware I will call pharmacy to find out. [facility] - [facility]exa and it's generic are on back order.  They have no idea when the medication will be supplied.

## 2018-08-03 NOTE — Telephone Encounter (Signed)
The pts wife called the office stating that we did not send her husbands Ranolazine's prescription to [facility]. I advised her that according to her chart the RX was sent to [facility] on [address] and that the pharmacy has received it. She stated "I never said [place] [address] [place] said [place]". I advised her that I would call the [facility] location and ask them to send the pts Ranolazine RX to the [facility] location.  I called the [facility] location and was advised by Jenny Reichmann that he would transfer the pts Ranolazine Rx to the [facility] location with a request to fill ASAP.

## 2018-08-03 NOTE — Telephone Encounter (Signed)
[facility] aware [facility] not [facility] in either form.  Aware we checked the nationwide storage list and ranexa is not on it therefore it is more than likely a [facility] issue.  Advised I can send the RX into a different pharmacy of his choice.  He requests it be sent into [facility].   He would like generic and a 90 day supply.   RX sent into pharmacy as requested.

## 2018-08-04 ENCOUNTER — Telehealth: Payer: Self-pay

## 2018-08-04 NOTE — Telephone Encounter (Signed)
Coverage Determination form for Ranexa received via fax from [facility]. I sent a Ranolazine 500 mg RX to [facility] yesterday because [facility] was [facility].  I called [facility] to see if the pt picked his Ranolazine RX up yet and I was advised that he did pick it up and he paid $3.40 for it.  I have faxed the form back to [place] with

## 2018-08-05 DIAGNOSIS — G4733 Obstructive sleep apnea (adult) (pediatric): Secondary | ICD-10-CM | POA: Diagnosis not present

## 2018-08-11 ENCOUNTER — Other Ambulatory Visit: Payer: Self-pay | Admitting: Internal Medicine

## 2018-08-11 ENCOUNTER — Telehealth: Payer: Self-pay | Admitting: Internal Medicine

## 2018-08-11 DIAGNOSIS — E118 Type 2 diabetes mellitus with unspecified complications: Secondary | ICD-10-CM

## 2018-08-11 DIAGNOSIS — Z794 Long term (current) use of insulin: Secondary | ICD-10-CM

## 2018-08-11 NOTE — Telephone Encounter (Signed)
Copied from [place] [id number]. Topic: Quick Communication - See Telephone Encounter >> Aug 11, 2018  2:30 PM Sheran Luz wrote: CRM for notification. See Telephone encounter for: 08/11/18.   Pt states that his pharmacy is requesting prior auth for Continuous Blood Gluc Receiver (FREESTYLE LIBRE [place]) DEVI [id number] .   [id number] [address], [place]. [phone] (Phone) [phone] (Fax)

## 2018-08-30 DIAGNOSIS — G4733 Obstructive sleep apnea (adult) (pediatric): Secondary | ICD-10-CM | POA: Diagnosis not present

## 2018-09-12 ENCOUNTER — Other Ambulatory Visit: Payer: Self-pay | Admitting: Internal Medicine

## 2018-09-12 DIAGNOSIS — I2510 Atherosclerotic heart disease of native coronary artery without angina pectoris: Secondary | ICD-10-CM

## 2018-09-12 DIAGNOSIS — E785 Hyperlipidemia, unspecified: Secondary | ICD-10-CM

## 2018-09-20 ENCOUNTER — Other Ambulatory Visit: Payer: Self-pay | Admitting: Internal Medicine

## 2018-09-20 DIAGNOSIS — J301 Allergic rhinitis due to pollen: Secondary | ICD-10-CM

## 2018-09-29 DIAGNOSIS — G4733 Obstructive sleep apnea (adult) (pediatric): Secondary | ICD-10-CM | POA: Diagnosis not present

## 2018-10-26 ENCOUNTER — Ambulatory Visit: Payer: PPO | Admitting: Podiatry

## 2018-10-30 DIAGNOSIS — G4733 Obstructive sleep apnea (adult) (pediatric): Secondary | ICD-10-CM | POA: Diagnosis not present

## 2018-11-03 DIAGNOSIS — R972 Elevated prostate specific antigen [PSA]: Secondary | ICD-10-CM | POA: Diagnosis not present

## 2018-11-09 ENCOUNTER — Other Ambulatory Visit: Payer: Self-pay | Admitting: Physician Assistant

## 2018-11-09 ENCOUNTER — Other Ambulatory Visit: Payer: Self-pay | Admitting: Nurse Practitioner

## 2018-11-10 ENCOUNTER — Other Ambulatory Visit: Payer: Self-pay | Admitting: Nurse Practitioner

## 2018-11-10 ENCOUNTER — Ambulatory Visit (INDEPENDENT_AMBULATORY_CARE_PROVIDER_SITE_OTHER): Payer: PPO | Admitting: Podiatry

## 2018-11-10 ENCOUNTER — Other Ambulatory Visit: Payer: Self-pay | Admitting: Physician Assistant

## 2018-11-10 DIAGNOSIS — M79609 Pain in unspecified limb: Secondary | ICD-10-CM | POA: Diagnosis not present

## 2018-11-10 DIAGNOSIS — B351 Tinea unguium: Secondary | ICD-10-CM | POA: Diagnosis not present

## 2018-11-10 NOTE — Telephone Encounter (Signed)
Outpatient Medication Detail    Disp Refills Start End   losartan (COZAAR) 50 MG tablet 15 tablet 0 11/09/2018    Sig - Route: Take 1 tablet (50 mg total) by mouth daily. Please make overdue appt with Dr. Marlou Porch before anymore refills. 2nd attempt - Oral   Sent to pharmacy as: losartan (COZAAR) 50 MG tablet   Notes to Pharmacy: Please call our office to schedule an overdue appointment with Dr. Marlou Porch before anymore refills. [phone]. Thank you 2nd attempt   E-Prescribing Status: Receipt confirmed by pharmacy (11/09/2018 11:35 AM EST)   [facility]   [id number] [place], [place] [address]

## 2018-11-10 NOTE — Patient Instructions (Signed)

## 2018-11-10 NOTE — Telephone Encounter (Signed)
Outpatient Medication Detail    Disp Refills Start End   isosorbide mononitrate (IMDUR) 120 MG 24 hr tablet 15 tablet 0 11/09/2018    Sig - Route: Take 1 tablet (120 mg total) by mouth daily. Please make overdue appt with Dr. Marlou Porch before anymore refills. 2nd attempt - Oral   Sent to pharmacy as: isosorbide mononitrate (IMDUR) 120 MG 24 hr tablet   Notes to Pharmacy: Please call our office to schedule an overdue yearly appointment with Dr. Marlou Porch before anymore refills. [phone]. Thank you 2nd attempt   E-Prescribing Status: Receipt confirmed by pharmacy (11/09/2018 11:37 AM EST)   [facility]   [id number] [place], [place].

## 2018-11-10 NOTE — Progress Notes (Signed)
Subjective: [name] presents today for follow up of painful, discolored, thick toenails which interfere with daily activities and routine tasks.  Pain is aggravated when wearing enclosed shoe gear. Pain is getting progressively worse and relieved with periodic professional debridement.  Objective: Vascular Examination: Capillary refill time <3 seconds x 10 digits Dorsalis pedis and posterior tibial pulses nonpalpable b/l No digital hair x 10 digits Skin temperature warm to warm b/l  Dermatological Examination: Skin thin and atrophic b/l Toenails 1-5 b/l discolored, thick, dystrophic with subungual debris and pain with palpation to nailbeds due to thickness of nails. No open wounds No interdigital macerations  Musculoskeletal: Muscle strength 5/5 to all LE muscle groups LLE Foot drop RLE  Neurological: Sensation intact with 10 gram monofilament. Vibratory sensation intact.  Assessment: Painful onychomycosis toenails 1-5 b/l   Plan: 1. Toenails 1-5 b/l were debrided in length and girth without iatrogenic bleeding. 2. Patient to continue soft, supportive shoe gear 3. Patient to report any pedal injuries to medical professional immediately. 4. Follow up 3 months. Patient/POA to call should there be a concern in the interim.

## 2018-11-11 ENCOUNTER — Other Ambulatory Visit: Payer: Self-pay | Admitting: Nurse Practitioner

## 2018-11-11 ENCOUNTER — Encounter: Payer: Self-pay | Admitting: Podiatry

## 2018-11-15 DIAGNOSIS — G4733 Obstructive sleep apnea (adult) (pediatric): Secondary | ICD-10-CM | POA: Diagnosis not present

## 2018-11-29 DIAGNOSIS — G4733 Obstructive sleep apnea (adult) (pediatric): Secondary | ICD-10-CM | POA: Diagnosis not present

## 2018-12-04 ENCOUNTER — Other Ambulatory Visit: Payer: Self-pay | Admitting: Cardiology

## 2018-12-04 DIAGNOSIS — I5022 Chronic systolic (congestive) heart failure: Secondary | ICD-10-CM

## 2018-12-06 ENCOUNTER — Other Ambulatory Visit: Payer: Self-pay | Admitting: Physician Assistant

## 2018-12-10 ENCOUNTER — Ambulatory Visit (INDEPENDENT_AMBULATORY_CARE_PROVIDER_SITE_OTHER): Payer: PPO | Admitting: Cardiology

## 2018-12-10 ENCOUNTER — Encounter: Payer: Self-pay | Admitting: Cardiology

## 2018-12-10 VITALS — BP 114/60 | HR 72 | Ht 70.0 in | Wt 250.0 lb

## 2018-12-10 DIAGNOSIS — E7800 Pure hypercholesterolemia, unspecified: Secondary | ICD-10-CM

## 2018-12-10 DIAGNOSIS — I2510 Atherosclerotic heart disease of native coronary artery without angina pectoris: Secondary | ICD-10-CM

## 2018-12-10 DIAGNOSIS — I5022 Chronic systolic (congestive) heart failure: Secondary | ICD-10-CM

## 2018-12-10 DIAGNOSIS — H540X33 Blindness right eye category 3, blindness left eye category 3: Secondary | ICD-10-CM | POA: Diagnosis not present

## 2018-12-10 MED ORDER — ASPIRIN 81 MG PO TBEC: 81.0000 mg | DELAYED_RELEASE_TABLET | Freq: Every day | ORAL | 1 refills | Status: DC

## 2018-12-10 MED ORDER — LOSARTAN POTASSIUM 50 MG PO TABS: 50.0000 mg | ORAL_TABLET | Freq: Every day | ORAL | 3 refills | Status: DC

## 2018-12-10 MED ORDER — CARVEDILOL 12.5 MG PO TABS
ORAL_TABLET | ORAL | 3 refills | Status: DC
Start: 2018-12-10 — End: 2019-11-30

## 2018-12-10 MED ORDER — ISOSORBIDE MONONITRATE ER 120 MG PO TB24: 120.0000 mg | ORAL_TABLET | Freq: Every day | ORAL | 3 refills | Status: DC

## 2018-12-10 NOTE — Patient Instructions (Signed)
Medication Instructions:  Please discontinue your Irbesartan and continue your Losartan. Continue all other medications as listed.  If you need a refill on your cardiac medications before your next appointment, please call your pharmacy.   Follow-Up: At [facility], you and your health needs are our priority.  As part of our continuing mission to provide you with exceptional heart care, we have created designated Provider Care Teams.  These Care Teams include your primary Cardiologist (physician) and Advanced Practice Providers (APPs -  Physician Assistants and Nurse Practitioners) who all work together to provide you with the care you need, when you need it. You will need a follow up appointment in 12 months.  Please call our office 2 months in advance to schedule this appointment.  You may see Candee Furbish, MD or one of the following Advanced Practice Providers on your designated Care Team:   Truitt Merle, NP Cecilie Kicks, NP . Kathyrn Drown, NP  Thank you for choosing [facility]!!

## 2018-12-10 NOTE — Progress Notes (Signed)
Cardiology Office Note:    Date:  12/10/2018   ID:  [name], DOB 1951-11-29, MRN [id number]  PCP:  [name], MD  Cardiologist:  Candee Furbish, MD  Electrophysiologist:  None   Referring MD: [name], MD     History of Present Illness:    [name] is a 67 y.o. male here for follow-up of coronary artery disease prior stenting to left circumflex with concomitant diabetes polyneuropathy hypertension hyperlipidemia obesity blindness sleep apnea.  Cardiac catheterization back in 2008 demonstrated circumflex disease and he had PCI with bare-metal stent placement by Dr. Ilda Foil at the time.  Right coronary artery was managed medically and not favorable for PCI.  In August 2017 and nuclear stress test was performed and showed no ischemia.  Quite a dramatic weight loss in the past from 290 pounds.  Overall, weight is creeping up.  Continuing to watch.  Looks like he had both irbesartan and losartan on his medicine list.  [name] was likely substituted for losartan during blackout period.  He would like to go back on losartan only.  We will refill.  He does feel some chest discomfort shortness of breath when walking quickly or long distances.  This is manageable for him.   Past Medical History:  Diagnosis Date  . Arthritis    "mild arthritis in hip"  . CAD (coronary artery disease)   . CHF (congestive heart failure) ([place])   . Complication of anesthesia    "lungs filled up with fluid"   . Diabetes mellitus    Type II  . Glaucoma   . Heart murmur   . HTN (hypertension)   . Hypercholesterolemia   . LBP (low back pain)   . Legally blind   . Peripheral neuropathy   . Sleep apnea   . Staph infection    "from back surgery"  . Trigger finger   . Ulcer   . Wears glasses    "to protect cornea" - legally blind    Past Surgical History:  Procedure Laterality Date  . CHOLECYSTECTOMY  09/15/2012   Procedure: LAPAROSCOPIC CHOLECYSTECTOMY WITH INTRAOPERATIVE  CHOLANGIOGRAM;  Surgeon: Pedro Earls, MD;  Location: [facility];  Service: General;  Laterality: N/A;  . EYE SURGERY Bilateral    cataracts  . INGUINAL HERNIA REPAIR     right  . LUMBAR FUSION    . LUMBAR LAMINECTOMY/DECOMPRESSION MICRODISCECTOMY N/A 10/26/2015   Procedure: RIGHT AND CENTRAL LUMBAR LAMINECTOMY L3-4, RIGHT L5-S1 LATERAL RECESS DECOMPRESSION;  Surgeon: Jessy Oto, MD;  Location: [facility];  Service: Orthopedics;  Laterality: N/A;  . NECK SURGERY    . peptic ulcer dz surgery  pt was in his 20s   bleeding ulcer.   . Prosthetic Cornea placement, Right eye  2007   [place] Right 10/26/2015   Procedure: RELEASE TRIGGER FINGER RIGHT THUMB;  Surgeon: Jessy Oto, MD;  Location: [facility];  Service: Orthopedics;  Laterality: Right;    Current Medications: Current Meds  Medication Sig  . aspirin 81 MG EC tablet Take 1 tablet (81 mg total) by mouth daily. Swallow whole.  Marland Kitchen atorvastatin (LIPITOR) 80 MG tablet TAKE 1 TABLET (80 MG TOTAL) BY MOUTH DAILY AT 6 PM.  . carvedilol (COREG) 12.5 MG tablet TAKE 1 TABLET BY MOUTH TWICE A DAY WITH A MEAL  . Continuous Blood Gluc Receiver (FREESTYLE LIBRE 14 DAY READER) DEVI USE AS DIRECTED FOR READINGS  . Continuous Blood  Gluc Sensor (FREESTYLE LIBRE SENSOR SYSTEM) MISC Use 1 sensor every 14 days, then replace with new one.  . cyanocobalamin 2000 MCG tablet Take 1 tablet (2,000 mcg total) by mouth daily.  Marland Kitchen ezetimibe (ZETIA) 10 MG tablet Take 1 tablet (10 mg total) by mouth daily.  . fluticasone (FLONASE) 50 MCG/ACT nasal spray SPRAY 2 SPRAYS INTO EACH NOSTRIL EVERY DAY  . gabapentin (NEURONTIN) 300 MG capsule TAKE 1 CAPSULE BY MOUTH 3 TIMES A DAY  . glucose blood (ONETOUCH VERIO) test strip Use BID to check BS. DX E11.9  . HYDROcodone-acetaminophen (NORCO) 7.5-325 MG tablet TAKE 1 TABLET BY MOUTH EVERY 4 TO 6 HOURS AS NEEDED FOR PAIN MAX 8 TABS PER DAY  . insulin lispro protamine-lispro (HUMALOG 75/25 MIX) (75-25)  100 UNIT/ML SUSP injection   . insulin NPH-regular Human (NOVOLIN 70/30) (70-30) 100 UNIT/ML injection 85 U SQ QAM and 45 U SQ QPM  . Insulin Syringe-Needle U-100 (B-D INS SYR ULTRAFINE 1CC/30G) 30G X 1/2" 1 ML MISC USE AS DIRECTED WITH INSULIN VIALS  . isosorbide mononitrate (IMDUR) 120 MG 24 hr tablet Take 1 tablet (120 mg total) by mouth daily.  Marland Kitchen JANUMET 50-1000 MG tablet TAKE 1 TABLET BY MOUTH TWICE A DAY WITH A MEAL  . levocetirizine (XYZAL) 5 MG tablet Take 1 tablet (5 mg total) by mouth every evening.  Marland Kitchen losartan (COZAAR) 50 MG tablet Take 1 tablet (50 mg total) by mouth daily.  . nitroGLYCERIN (NITROSTAT) 0.4 MG SL tablet Place 1 tablet (0.4 mg total) under the tongue every 5 (five) minutes as needed for chest pain.  Marland Kitchen omega-3 acid ethyl esters (LOVAZA) 1 g capsule TAKE 2 CAPSULES (2 G TOTAL) BY MOUTH 2 (TWO) TIMES DAILY.  Marland Kitchen oxyCODONE-acetaminophen (PERCOCET/ROXICET) 5-325 MG tablet 5-325 mg  . pantoprazole (PROTONIX) 40 MG tablet Take 1 tablet (40 mg total) by mouth 2 (two) times daily.  Marland Kitchen PRODIGY LANCETS 28G MISC Use to check blood sugars twice a day Dx e11.9  . ranolazine (RANEXA) 500 MG 12 hr tablet Take 1 tablet (500 mg total) by mouth 2 (two) times daily.  . tamsulosin (FLOMAX) 0.4 MG CAPS capsule Take 1 capsule (0.4 mg total) by mouth daily.  . traMADol (ULTRAM) 50 MG tablet TAKE 1 TABLET BY MOUTH EVERY 8 HOURS AS NEEDED  . [DISCONTINUED] aspirin 81 MG EC tablet TAKE 1 TABLET BY MOUTH EVERY DAY  . [DISCONTINUED] carvedilol (COREG) 12.5 MG tablet TAKE 1 TABLET BY MOUTH TWICE A DAY WITH A MEAL please keep upcoming appt in December with Dr. Marlou Porch. Thank you  . [DISCONTINUED] irbesartan (AVAPRO) 150 MG tablet Take 1 tablet (150 mg total) by mouth daily.  . [DISCONTINUED] isosorbide mononitrate (IMDUR) 120 MG 24 hr tablet Take 1 tablet (120 mg total) by mouth daily. Please keep upcoming appointment for further refills. Thank you!  . [DISCONTINUED] losartan (COZAAR) 50 MG tablet Take 1  tablet (50 mg total) by mouth daily. Please make overdue appt with Dr. Marlou Porch before anymore refills. 2nd attempt     Allergies:   Furosemide; Amlodipine; Diamox [acetazolamide]; and Lisinopril   Social History   Socioeconomic History  . Marital status: Married    Spouse name: Not on file  . Number of children: 3  . Years of education: 70  . Highest education level: Not on file  Occupational History  . Occupation: disabled    Comment: blind  Social Needs  . Financial resource strain: Not very hard  . Food insecurity:  Worry: Sometimes true    Inability: Sometimes true  . Transportation needs:    Medical: No    Non-medical: No  Tobacco Use  . Smoking status: Never Smoker  . Smokeless tobacco: Never Used  Substance and Sexual Activity  . Alcohol use: No  . Drug use: No  . Sexual activity: Not Currently  Lifestyle  . Physical activity:    Days per week: 0 days    Minutes per session: 0 min  . Stress: Not at all  Relationships  . Social connections:    Talks on phone: More than three times a week    Gets together: More than three times a week    Attends religious service: More than 4 times per year    Active member of club or organization: Yes    Attends meetings of clubs or organizations: More than 4 times per year    Relationship status: Married  Other Topics Concern  . Not on file  Social History Narrative   Occupation: disabled, blind   Married   Regular Exercise-no   Lives at home with his wife.   Right-handed.   2-3 cups caffeine per day.     Family History: The patient's family history includes Breast cancer in his mother; Colon cancer in his mother and another family member; Diabetes in his brother, mother, and sister; Hypertension in his father and mother.  ROS:   Please see the history of present illness.    No myalgias, no fevers no bleeding no syncope all other systems reviewed and are negative.  EKGs/Labs/Other Studies Reviewed:    The  following studies were reviewed today:  Echocardiogram 07/10/2017: - Left ventricle: The cavity size was normal. There was severe   concentric hypertrophy. Systolic function was normal. The   estimated ejection fraction was in the range of 60% to 65%. Wall   motion was normal; there were no regional wall motion   abnormalities. Doppler parameters are consistent with abnormal   left ventricular relaxation (grade 1 diastolic dysfunction).   Doppler parameters are consistent with elevated ventricular   end-diastolic filling pressure. - Aortic valve: Trileaflet; normal thickness leaflets. There was no   regurgitation. - Aortic root: The aortic root was normal in size. - Mitral valve: Structurally normal valve. There was mild   regurgitation. Valve area by pressure half-time: 1.29 cm^2. - Left atrium: The atrium was normal in size. - Right ventricle: Systolic function was normal. - Right atrium: The atrium was normal in size. - Tricuspid valve: There was no regurgitation. - Pulmonary arteries: Systolic pressure was within the normal   range. - Inferior vena cava: The vessel was normal in size. - Pericardium, extracardiac: There was no pericardial effusion.  Nuclear stress test 07/27/16:  - Low risk, no ischemia, ejection fraction 56%  Lower extremity Dopplers 07/22/2018: -No DVTs  EKG:  EKG is ordered today.  The ekg ordered today demonstrates normal sinus rhythm heart rate 72 bpm sinus rhythm first-degree AV block 220 ms low voltage QRS likely secondary to body habitus poor R wave progression personally reviewed and interpreted.  Recent Labs: 07/19/2018: BUN 14; Creatinine, Ser 1.17; Hemoglobin 12.1; Platelets 233.0; Potassium 4.2; Sodium 140  Recent Lipid Panel    Component Value Date/Time   CHOL 123 11/24/2017 1116   TRIG 123.0 11/24/2017 1116   HDL 29.70 (L) 11/24/2017 1116   CHOLHDL 4 11/24/2017 1116   VLDL 24.6 11/24/2017 1116   LDLCALC 69 11/24/2017 1116   LDLDIRECT 82.7  04/04/2013 1451    Physical Exam:    VS:  BP 114/60   Pulse 72   Ht 5\' 10"  (1.778 m)   Wt 250 lb (113.4 kg)   BMI 35.87 kg/m     Wt Readings from Last 3 Encounters:  12/10/18 250 lb (113.4 kg)  07/19/18 234 lb 1.3 oz (106.2 kg)  05/11/18 240 lb (108.9 kg)     GEN: Obese well nourished, well developed in no acute distress HEENT: Blind NECK: No JVD; No carotid bruits LYMPHATICS: No lymphadenopathy CARDIAC: RRR, no murmurs, rubs, gallops RESPIRATORY:  Clear to auscultation without rales, wheezing or rhonchi  ABDOMEN: Soft, non-tender, non-distended MUSCULOSKELETAL: Chronic appearing lower extremity edema; No deformity  SKIN: Warm and dry NEUROLOGIC:  Alert and oriented x 3 PSYCHIATRIC:  Normal affect   ASSESSMENT:    1. Coronary artery disease involving native coronary artery of native heart without angina pectoris   2. Pure hypercholesterolemia   3. Chronic systolic congestive heart failure (HCC)   4. Category 3 blindness of both eyes   5. Atherosclerosis of native coronary artery of native heart without angina pectoris    PLAN:    In order of problems listed above:  Coronary artery disease - Isosorbide, Ranexa (had some trouble with prior authorization in the past) nuclear stress test 2017 was reassuring.  Echocardiogram 2018 showed normal EF.  Continue with current medications.  No anginal symptoms.  Diabetes with hyperlipidemia and hypertension - Overall [name] has been managing with excellent hemoglobin A1c. -I will stop his irbesartan and continue his losartan.  Both medications were on his medicine list. - Zetia, atorvastatin, Lovaza-LDL 69 triglycerides 123 HDL 29.  Morbid obesity -BMI greater than 35 with 2 or more comorbidities.  Continue to encourage weight loss, decrease carbohydrates.  His weight is slowly creeping back up.  Blindness -No changes, followed at [facility].  Overall been doing quite well.  Continue to work on conditioning efforts.  1 year  follow up   Medication Adjustments/Labs and Tests Ordered: Current medicines are reviewed at length with the patient today.  Concerns regarding medicines are outlined above.  Orders Placed This Encounter  Procedures  . EKG 12-Lead   Meds ordered this encounter  Medications  . losartan (COZAAR) 50 MG tablet    Sig: Take 1 tablet (50 mg total) by mouth daily.    Dispense:  90 tablet    Refill:  3  . aspirin 81 MG EC tablet    Sig: Take 1 tablet (81 mg total) by mouth daily. Swallow whole.    Dispense:  90 tablet    Refill:  1    DX Code Needed  .  . carvedilol (COREG) 12.5 MG tablet    Sig: TAKE 1 TABLET BY MOUTH TWICE A DAY WITH A MEAL    Dispense:  180 tablet    Refill:  3  . isosorbide mononitrate (IMDUR) 120 MG 24 hr tablet    Sig: Take 1 tablet (120 mg total) by mouth daily.    Dispense:  90 tablet    Refill:  3    Patient Instructions  Medication Instructions:  Please discontinue your Irbesartan and continue your Losartan. Continue all other medications as listed.  If you need a refill on your cardiac medications before your next appointment, please call your pharmacy.   Follow-Up: At [facility], you and your health needs are our priority.  As part of our continuing mission to provide you with exceptional heart care,  we have created designated Provider Care Teams.  These Care Teams include your primary Cardiologist (physician) and Advanced Practice Providers (APPs -  Physician Assistants and Nurse Practitioners) who all work together to provide you with the care you need, when you need it. You will need a follow up appointment in 12 months.  Please call our office 2 months in advance to schedule this appointment.  You may see Candee Furbish, MD or one of the following Advanced Practice Providers on your designated Care Team:   Truitt Merle, NP Cecilie Kicks, NP . Kathyrn Drown, NP  Thank you for choosing [facility]!!         Signed, Candee Furbish, MD   12/10/2018 2:59 PM    [place]

## 2018-12-30 DIAGNOSIS — G4733 Obstructive sleep apnea (adult) (pediatric): Secondary | ICD-10-CM | POA: Diagnosis not present

## 2019-01-05 ENCOUNTER — Other Ambulatory Visit: Payer: Self-pay | Admitting: Internal Medicine

## 2019-01-05 DIAGNOSIS — E118 Type 2 diabetes mellitus with unspecified complications: Secondary | ICD-10-CM

## 2019-01-05 DIAGNOSIS — Z794 Long term (current) use of insulin: Secondary | ICD-10-CM

## 2019-01-09 ENCOUNTER — Other Ambulatory Visit: Payer: Self-pay | Admitting: Physician Assistant

## 2019-01-09 ENCOUNTER — Other Ambulatory Visit: Payer: Self-pay | Admitting: Internal Medicine

## 2019-01-24 ENCOUNTER — Other Ambulatory Visit (INDEPENDENT_AMBULATORY_CARE_PROVIDER_SITE_OTHER): Payer: Self-pay | Admitting: Specialist

## 2019-01-24 ENCOUNTER — Other Ambulatory Visit: Payer: Self-pay | Admitting: Internal Medicine

## 2019-01-24 NOTE — Telephone Encounter (Signed)
Gabapentin refill request 

## 2019-01-30 DIAGNOSIS — G4733 Obstructive sleep apnea (adult) (pediatric): Secondary | ICD-10-CM | POA: Diagnosis not present

## 2019-02-01 ENCOUNTER — Telehealth: Payer: Self-pay | Admitting: Internal Medicine

## 2019-02-01 ENCOUNTER — Other Ambulatory Visit: Payer: Self-pay | Admitting: Internal Medicine

## 2019-02-01 DIAGNOSIS — E0849 Diabetes mellitus due to underlying condition with other diabetic neurological complication: Secondary | ICD-10-CM

## 2019-02-01 DIAGNOSIS — M961 Postlaminectomy syndrome, not elsewhere classified: Secondary | ICD-10-CM

## 2019-02-01 DIAGNOSIS — M5416 Radiculopathy, lumbar region: Secondary | ICD-10-CM

## 2019-02-01 NOTE — Addendum Note (Signed)
Addended by: Karle Barr on: 02/01/2019 04:39 PM   Modules accepted: Orders

## 2019-02-01 NOTE — Telephone Encounter (Signed)
Medication not delegated to NT for refill

## 2019-02-01 NOTE — Telephone Encounter (Signed)
Copied from [place] [phone]. Topic: Quick Communication - Rx Refill/Question >> Feb 01, 2019  1:54 PM Keene Breath wrote: Medication: traMADol (ULTRAM) 50 MG tablet  Patient called to request a refill for the above medication.  He stated that he is all out of the medication.  Preferred Pharmacy (with phone number or street name): [id number] [address], [place]. [phone] (Phone) [phone] (Fax)   Agent: Please be advised that RX refills may take up to 3 business days. We ask that you follow-up with your pharmacy.

## 2019-02-02 NOTE — Telephone Encounter (Signed)
Patient answered and is blind and cannot set up appointment, patient wife was at work and will be home at 4:30-5pm. Can someone call around that time and speak to wife to get patient appointment set up please? Patient is overdue for CPE

## 2019-02-02 NOTE — Telephone Encounter (Signed)
Can you call pt wife and schedule pt for an appt?

## 2019-02-02 NOTE — Telephone Encounter (Signed)
Scheduled appointment for first available.

## 2019-02-03 NOTE — Telephone Encounter (Signed)
Pt is requesting medication to be sent in. They have scheduled an appt to see you.

## 2019-02-03 NOTE — Telephone Encounter (Signed)
Pt calling to find out if this medication will be called in for him.

## 2019-02-04 ENCOUNTER — Other Ambulatory Visit: Payer: Self-pay | Admitting: Internal Medicine

## 2019-02-04 DIAGNOSIS — M5416 Radiculopathy, lumbar region: Secondary | ICD-10-CM

## 2019-02-04 DIAGNOSIS — M961 Postlaminectomy syndrome, not elsewhere classified: Secondary | ICD-10-CM

## 2019-02-04 DIAGNOSIS — E0849 Diabetes mellitus due to underlying condition with other diabetic neurological complication: Secondary | ICD-10-CM

## 2019-02-09 ENCOUNTER — Ambulatory Visit (INDEPENDENT_AMBULATORY_CARE_PROVIDER_SITE_OTHER): Payer: PPO | Admitting: Podiatry

## 2019-02-09 ENCOUNTER — Encounter: Payer: Self-pay | Admitting: Podiatry

## 2019-02-09 DIAGNOSIS — E1142 Type 2 diabetes mellitus with diabetic polyneuropathy: Secondary | ICD-10-CM | POA: Diagnosis not present

## 2019-02-09 DIAGNOSIS — B351 Tinea unguium: Secondary | ICD-10-CM

## 2019-02-09 DIAGNOSIS — B353 Tinea pedis: Secondary | ICD-10-CM

## 2019-02-09 DIAGNOSIS — M79609 Pain in unspecified limb: Secondary | ICD-10-CM

## 2019-02-09 DIAGNOSIS — Z9289 Personal history of other medical treatment: Secondary | ICD-10-CM | POA: Insufficient documentation

## 2019-02-09 MED ORDER — KETOCONAZOLE 2 % EX CREA: TOPICAL_CREAM | CUTANEOUS | 1 refills | Status: DC

## 2019-02-09 NOTE — Progress Notes (Signed)
Subjective: [name] presents today with painful, thick toenails 1-5 b/l that he cannot cut and which interfere with daily activities.  Pain is aggravate when wearing enclosed shoe gear.  He takes gabapentin for neuropathy.  [name], MD is his PCP.   Allergies  Allergen Reactions  . Furosemide     pancreatitis  . Amlodipine Swelling    LE EDEMA  . Diamox [Acetazolamide] Itching    hives  . Lisinopril Rash    Objective:  Vascular Examination: Capillary refill time <3 seconds x 10 digits  Dorsalis pedis and Posterior tibial pulses nonpalpable b/l  Digital hair absent x 10 digits  Skin temperature gradient WNL b/l  Dermatological Examination: Skin with normal turgor, texture and tone b/l  Toenails 1-5 b/l discolored, thick, dystrophic with subungual debris and pain with palpation to nailbeds due to thickness of nails.  Diffuse scaling noted peripherally and plantarly b/l feet with mild foot odor.  No interdigital macerations.  No blisters, no weeping. No signs of secondary bacterial infection noted.  Musculoskeletal: Muscle strength 5/5 to all LE muscle groups  Foot drop RLE  Neurological: Sensation intact with 10 gram monofilament.  Assessment: 1. Painful onychomycosis toenails 1-5 b/l  2. Tinea pedis b/l 3. NIDDM with peripheral neuropathy  Plan: 1. Toenails 1-5 b/l were debrided in length and girth without iatrogenic bleeding. For tinea pedis, prescription sent to pharmacy for Ketoconazole Cream 2% to be applied to both feet and between toes qd x 6 weeks. 2. Patient to continue soft, supportive shoe gear. 3. Patient to report any pedal injuries to medical professional immediately. 4. Follow up 3 months.  5. Patient/POA to call should there be a concern in the interim.keto

## 2019-02-09 NOTE — Patient Instructions (Signed)

## 2019-02-16 DIAGNOSIS — G4733 Obstructive sleep apnea (adult) (pediatric): Secondary | ICD-10-CM | POA: Diagnosis not present

## 2019-02-18 ENCOUNTER — Other Ambulatory Visit: Payer: Self-pay | Admitting: Internal Medicine

## 2019-02-18 MED ORDER — FREESTYLE LIBRE SENSOR SYSTEM MISC: 3 refills | Status: DC

## 2019-02-18 NOTE — Telephone Encounter (Signed)
Copied from [place] [phone] [phone]. Topic: Quick Communication - Rx Refill/Question >> Feb 18, 2019 11:26 AM Margot Ables wrote: Medication: Continuous Blood Gluc Sensor ([place]) MISC  - pt states that the pharmacy has to get new RX from doctor because there were no refills - pt contacted the pharmacy 02/17/2019 - I do not see request - pt is due to change sensor 02/19/2019 to monitor blood sugar - no remaining sensors at the home Has the patient contacted their pharmacy? yes Preferred Pharmacy (with phone number or street name): [id number] [address], [place]. [phone] (Phone) [phone] (Fax)

## 2019-02-18 NOTE — Telephone Encounter (Signed)
Requested Prescriptions  Pending Prescriptions Disp Refills  . Continuous Blood Gluc Sensor (FREESTYLE LIBRE SENSOR SYSTEM) MISC 6 each 3    Sig: Use 1 sensor every 14 days, then replace with new one.     Endocrinology: Diabetes - Testing Supplies Passed - 02/18/2019 11:52 AM      Passed - Valid encounter within last 12 months    Recent Outpatient Visits          7 months ago Pedal edema   [facility], Marvis Repress, FNP   9 months ago Type 2 diabetes mellitus with complication, without long-term current use of insulin ([place])   [facility] -[facility] Camel, MD   1 year ago Type 2 diabetes mellitus with complication, with long-term current use of insulin [facility])   [place], Thomas L, MD   1 year ago Deficiency anemia   [facility] -Mayer Camel, MD   2 years ago Vitamin B12 deficiency anemia due to intrinsic factor deficiency   [place], MD      Future Appointments            In 6 days [name], MD [place], [facility]

## 2019-02-24 ENCOUNTER — Ambulatory Visit (INDEPENDENT_AMBULATORY_CARE_PROVIDER_SITE_OTHER): Payer: PPO | Admitting: Internal Medicine

## 2019-02-24 ENCOUNTER — Encounter

## 2019-02-24 ENCOUNTER — Other Ambulatory Visit (INDEPENDENT_AMBULATORY_CARE_PROVIDER_SITE_OTHER): Payer: PPO

## 2019-02-24 ENCOUNTER — Encounter: Payer: Self-pay | Admitting: Internal Medicine

## 2019-02-24 VITALS — BP 170/80 | HR 72 | Temp 98.7°F | Ht 70.0 in | Wt 241.0 lb

## 2019-02-24 DIAGNOSIS — E785 Hyperlipidemia, unspecified: Secondary | ICD-10-CM | POA: Diagnosis not present

## 2019-02-24 DIAGNOSIS — M5416 Radiculopathy, lumbar region: Secondary | ICD-10-CM

## 2019-02-24 DIAGNOSIS — E118 Type 2 diabetes mellitus with unspecified complications: Secondary | ICD-10-CM

## 2019-02-24 DIAGNOSIS — D510 Vitamin B12 deficiency anemia due to intrinsic factor deficiency: Secondary | ICD-10-CM

## 2019-02-24 DIAGNOSIS — M961 Postlaminectomy syndrome, not elsewhere classified: Secondary | ICD-10-CM

## 2019-02-24 DIAGNOSIS — I5032 Chronic diastolic (congestive) heart failure: Secondary | ICD-10-CM | POA: Diagnosis not present

## 2019-02-24 DIAGNOSIS — I10 Essential (primary) hypertension: Secondary | ICD-10-CM

## 2019-02-24 DIAGNOSIS — Z0000 Encounter for general adult medical examination without abnormal findings: Secondary | ICD-10-CM

## 2019-02-24 DIAGNOSIS — I2510 Atherosclerotic heart disease of native coronary artery without angina pectoris: Secondary | ICD-10-CM | POA: Diagnosis not present

## 2019-02-24 DIAGNOSIS — Z1159 Encounter for screening for other viral diseases: Secondary | ICD-10-CM | POA: Diagnosis not present

## 2019-02-24 DIAGNOSIS — E0849 Diabetes mellitus due to underlying condition with other diabetic neurological complication: Secondary | ICD-10-CM | POA: Diagnosis not present

## 2019-02-24 DIAGNOSIS — N400 Enlarged prostate without lower urinary tract symptoms: Secondary | ICD-10-CM

## 2019-02-24 DIAGNOSIS — Z23 Encounter for immunization: Secondary | ICD-10-CM | POA: Diagnosis not present

## 2019-02-24 DIAGNOSIS — K635 Polyp of colon: Secondary | ICD-10-CM | POA: Insufficient documentation

## 2019-02-24 LAB — CBC WITH DIFFERENTIAL/PLATELET
Basophils Absolute: 0.1 10*3/uL (ref 0.0–0.1)
Basophils Relative: 1.2 % (ref 0.0–3.0)
Eosinophils Absolute: 0.2 10*3/uL (ref 0.0–0.7)
Eosinophils Relative: 4.1 % (ref 0.0–5.0)
HCT: 37.8 % — ABNORMAL LOW (ref 39.0–52.0)
Hemoglobin: 12.1 g/dL — ABNORMAL LOW (ref 13.0–17.0)
LYMPHS ABS: 2.1 10*3/uL (ref 0.7–4.0)
Lymphocytes Relative: 35.9 % (ref 12.0–46.0)
MCHC: 32.1 g/dL (ref 30.0–36.0)
MCV: 72 fl — ABNORMAL LOW (ref 78.0–100.0)
MONO ABS: 0.4 10*3/uL (ref 0.1–1.0)
Monocytes Relative: 7.4 % (ref 3.0–12.0)
NEUTROS ABS: 3 10*3/uL (ref 1.4–7.7)
NEUTROS PCT: 51.4 % (ref 43.0–77.0)
PLATELETS: 224 10*3/uL (ref 150.0–400.0)
RBC: 5.25 Mil/uL (ref 4.22–5.81)
RDW: 15.4 % (ref 11.5–15.5)
WBC: 5.8 10*3/uL (ref 4.0–10.5)

## 2019-02-24 LAB — URINALYSIS, ROUTINE W REFLEX MICROSCOPIC
Hgb urine dipstick: NEGATIVE
Leukocytes,Ua: NEGATIVE
Nitrite: NEGATIVE
PH: 5.5 (ref 5.0–8.0)
Specific Gravity, Urine: >=1.03 — AB (ref 1.000–1.030)
UROBILINOGEN UA: 1 (ref 0.0–1.0)
Urine Glucose: NEGATIVE

## 2019-02-24 LAB — LIPID PANEL
CHOLESTEROL: 104 mg/dL (ref 0–200)
HDL: 25.5 mg/dL — ABNORMAL LOW (ref >39.00)
LDL Cholesterol: 49 mg/dL (ref 0–99)
NonHDL: 78.1
Total CHOL/HDL Ratio: 4
Triglycerides: 146 mg/dL (ref 0.0–149.0)
VLDL: 29.2 mg/dL (ref 0.0–40.0)

## 2019-02-24 LAB — PSA: PSA: 4.08 ng/mL — ABNORMAL HIGH (ref 0.10–4.00)

## 2019-02-24 LAB — COMPREHENSIVE METABOLIC PANEL
ALT: 13 U/L (ref 0–53)
AST: 13 U/L (ref 0–37)
Albumin: 4.2 g/dL (ref 3.5–5.2)
Alkaline Phosphatase: 53 U/L (ref 39–117)
BUN: 16 mg/dL (ref 6–23)
CO2: 25 mEq/L (ref 19–32)
Calcium: 8.7 mg/dL (ref 8.4–10.5)
Chloride: 103 mEq/L (ref 96–112)
Creatinine, Ser: 1.14 mg/dL (ref 0.40–1.50)
GFR: 76.95 mL/min (ref >60.00)
GLUCOSE: 133 mg/dL — AB (ref 70–99)
POTASSIUM: 4.3 meq/L (ref 3.5–5.1)
Sodium: 138 mEq/L (ref 135–145)
Total Bilirubin: 0.6 mg/dL (ref 0.2–1.2)
Total Protein: 7.4 g/dL (ref 6.0–8.3)

## 2019-02-24 LAB — MICROALBUMIN / CREATININE URINE RATIO
Creatinine,U: 292.9 mg/dL
Microalb Creat Ratio: 1.4 mg/g (ref 0.0–30.0)
Microalb, Ur: 4.2 mg/dL — ABNORMAL HIGH (ref 0.0–1.9)

## 2019-02-24 LAB — HEMOGLOBIN A1C: Hgb A1c MFr Bld: 7.4 % — ABNORMAL HIGH (ref 4.6–6.5)

## 2019-02-24 LAB — VITAMIN B12: Vitamin B-12: >1525 pg/mL — ABNORMAL HIGH (ref 211–911)

## 2019-02-24 LAB — TSH: TSH: 1.15 u[IU]/mL (ref 0.35–4.50)

## 2019-02-24 LAB — FOLATE: Folate: 21.3 ng/mL (ref >5.9)

## 2019-02-24 MED ORDER — OLMESARTAN MEDOXOMIL-HCTZ 40-12.5 MG PO TABS: 1.0000 | ORAL_TABLET | Freq: Every day | ORAL | 1 refills | Status: DC

## 2019-02-24 MED ORDER — CYANOCOBALAMIN 2000 MCG PO TABS: 2000.0000 ug | ORAL_TABLET | Freq: Every day | ORAL | 1 refills | Status: DC

## 2019-02-24 MED ORDER — NITROGLYCERIN 0.4 MG SL SUBL: 0.4000 mg | SUBLINGUAL_TABLET | SUBLINGUAL | 3 refills | Status: DC | PRN

## 2019-02-24 MED ORDER — TRAMADOL HCL 50 MG PO TABS: 50.0000 mg | ORAL_TABLET | Freq: Three times a day (TID) | ORAL | 1 refills | Status: DC | PRN

## 2019-02-24 NOTE — Patient Instructions (Signed)

## 2019-02-24 NOTE — Progress Notes (Signed)
Subjective:  Patient ID: [name], male    DOB: May 04, 1951  Age: 68 y.o. MRN: [id number]  CC: Annual Exam; Anemia; Hypertension; Diabetes; and Hyperlipidemia   HPI BRET VANESSEN presents for a CPX.  He is concerned that his blood pressure is not well controlled.  He denies any recent episodes of CP or DOE.  He complains of chronic fatigue and lower extremity edema.  He has been able to lose weight with caloric restrictions.   Past Medical History:  Diagnosis Date  . Arthritis    "mild arthritis in hip"  . CAD (coronary artery disease)   . CHF (congestive heart failure) ([place])   . Complication of anesthesia    "lungs filled up with fluid"   . Diabetes mellitus    Type II  . Glaucoma   . Heart murmur   . HTN (hypertension)   . Hypercholesterolemia   . LBP (low back pain)   . Legally blind   . Peripheral neuropathy   . Sleep apnea   . Staph infection    "from back surgery"  . Trigger finger   . Ulcer   . Wears glasses    "to protect cornea" - legally blind   Past Surgical History:  Procedure Laterality Date  . CHOLECYSTECTOMY  09/15/2012   Procedure: LAPAROSCOPIC CHOLECYSTECTOMY WITH INTRAOPERATIVE CHOLANGIOGRAM;  Surgeon: Pedro Earls, MD;  Location: [place];  Service: General;  Laterality: N/A;  . EYE SURGERY Bilateral    cataracts  . INGUINAL HERNIA REPAIR     right  . LUMBAR FUSION    . LUMBAR LAMINECTOMY/DECOMPRESSION MICRODISCECTOMY N/A 10/26/2015   Procedure: RIGHT AND CENTRAL LUMBAR LAMINECTOMY L3-4, RIGHT L5-S1 LATERAL RECESS DECOMPRESSION;  Surgeon: Jessy Oto, MD;  Location: [place];  Service: Orthopedics;  Laterality: N/A;  . NECK SURGERY    . peptic ulcer dz surgery  pt was in his 20s   bleeding ulcer.   . Prosthetic Cornea placement, Right eye  2007   [place] Right 10/26/2015   Procedure: RELEASE TRIGGER FINGER RIGHT THUMB;  Surgeon: Jessy Oto, MD;  Location: [place];  Service: Orthopedics;  Laterality: Right;     reports that he has never smoked. He has never used smokeless tobacco. He reports that he does not drink alcohol or use drugs. family history includes Breast cancer in his mother; Colon cancer in his mother and another family member; Diabetes in his brother, mother, and sister; Hypertension in his father and mother. Allergies  Allergen Reactions  . Furosemide     pancreatitis  . Amlodipine Swelling    LE EDEMA  . Diamox [Acetazolamide] Itching    hives  . Lisinopril Rash    Outpatient Medications Prior to Visit  Medication Sig Dispense Refill  . aspirin 81 MG EC tablet Take 1 tablet (81 mg total) by mouth daily. Swallow whole. 90 tablet 1  . atorvastatin (LIPITOR) 80 MG tablet TAKE 1 TABLET (80 MG TOTAL) BY MOUTH DAILY AT 6 PM. 90 tablet 1  . carvedilol (COREG) 12.5 MG tablet TAKE 1 TABLET BY MOUTH TWICE A DAY WITH A MEAL 180 tablet 3  . Continuous Blood Gluc Receiver (FREESTYLE LIBRE 14 DAY READER) DEVI USE AS DIRECTED FOR READINGS 1 Device 11  . Continuous Blood Gluc Sensor (FREESTYLE LIBRE SENSOR SYSTEM) MISC Use 1 sensor every 14 days, then replace with new one. 6 each 3  . ezetimibe (ZETIA) 10 MG tablet Take 1 tablet (  10 mg total) by mouth daily. 90 tablet 3  . fluticasone (FLONASE) 50 MCG/ACT nasal spray SPRAY 2 SPRAYS INTO EACH NOSTRIL EVERY DAY 48 g 3  . gabapentin (NEURONTIN) 300 MG capsule TAKE 1 CAPSULE BY MOUTH 3 TIMES A DAY 270 capsule 2  . glucose blood (ONETOUCH VERIO) test strip Use BID to check BS. DX E11.9 100 each 12  . HYDROcodone-acetaminophen (NORCO) 7.5-325 MG tablet TAKE 1 TABLET BY MOUTH EVERY 4 TO 6 HOURS AS NEEDED FOR PAIN MAX 8 TABS PER DAY    . insulin lispro protamine-lispro (HUMALOG 75/25 MIX) (75-25) 100 UNIT/ML SUSP injection     . insulin NPH-regular Human (NOVOLIN 70/30) (70-30) 100 UNIT/ML injection 85 U SQ QAM and 45 U SQ QPM 12 vial 3  . Insulin Syringe-Needle U-100 (B-D INS SYR ULTRAFINE 1CC/30G) 30G X 1/2" 1 ML MISC USE AS DIRECTED WITH INSULIN  VIALS 200 each 3  . isosorbide mononitrate (IMDUR) 120 MG 24 hr tablet Take 1 tablet (120 mg total) by mouth daily. 90 tablet 3  . JANUMET 50-1000 MG tablet TAKE 1 TABLET BY MOUTH TWICE A DAY WITH A MEAL 180 tablet 1  . ketoconazole (NIZORAL) 2 % cream Apply to both feet and between toes once daily for 6 weeks. 30 g 1  . levocetirizine (XYZAL) 5 MG tablet Take 1 tablet (5 mg total) by mouth every evening. 90 tablet 3  . omega-3 acid ethyl esters (LOVAZA) 1 g capsule TAKE 2 CAPSULES (2 G TOTAL) BY MOUTH 2 (TWO) TIMES DAILY. 360 capsule 1  . pantoprazole (PROTONIX) 40 MG tablet TAKE 1 TABLET BY MOUTH TWICE A DAY 180 tablet 3  . PRODIGY LANCETS 28G MISC Use to check blood sugars twice a day Dx e11.9 100 each 3  . ranolazine (RANEXA) 500 MG 12 hr tablet Take 1 tablet (500 mg total) by mouth 2 (two) times daily. 180 tablet 3  . tamsulosin (FLOMAX) 0.4 MG CAPS capsule Take 1 capsule (0.4 mg total) by mouth daily. 15 capsule 1  . cyanocobalamin 2000 MCG tablet Take 1 tablet (2,000 mcg total) by mouth daily. 90 tablet 1  . losartan (COZAAR) 50 MG tablet Take 1 tablet (50 mg total) by mouth daily. 90 tablet 3  . nitroGLYCERIN (NITROSTAT) 0.4 MG SL tablet Place 1 tablet (0.4 mg total) under the tongue every 5 (five) minutes as needed for chest pain. 25 tablet 3  . oxyCODONE-acetaminophen (PERCOCET/ROXICET) 5-325 MG tablet 5-325 mg    . traMADol (ULTRAM) 50 MG tablet TAKE 1 TABLET BY MOUTH EVERY 8 HOURS AS NEEDED 90 tablet 0   No facility-administered medications prior to visit.     ROS Review of Systems  Constitutional: Positive for fatigue. Negative for appetite change, chills, diaphoresis and unexpected weight change.  HENT: Negative.   Eyes: Negative for visual disturbance.  Respiratory: Negative for cough, chest tightness, shortness of breath and wheezing.   Cardiovascular: Negative for chest pain, palpitations and leg swelling.  Gastrointestinal: Negative for abdominal pain, constipation,  diarrhea, nausea and vomiting.  Endocrine: Negative for polydipsia, polyphagia and polyuria.  Genitourinary: Negative.  Negative for difficulty urinating, dysuria, hematuria, penile swelling, scrotal swelling, testicular pain and urgency.  Musculoskeletal: Positive for back pain. Negative for arthralgias and myalgias.       He complains of stinging pain in both feet.  Skin: Negative.  Negative for color change.  Neurological: Negative.  Negative for dizziness, weakness, light-headedness, numbness and headaches.  Hematological: Negative for adenopathy. Does not bruise/bleed  easily.  Psychiatric/Behavioral: Negative.     Objective:  BP (!) 170/80 (BP Location: Left Arm, Patient Position: Sitting, Cuff Size: Large)   Pulse 72   Temp 98.7 F (37.1 C) (Oral)   Ht 5\' 10"  (1.778 m)   Wt 241 lb (109.3 kg)   SpO2 96%   BMI 34.58 kg/m   BP Readings from Last 3 Encounters:  02/24/19 (!) 170/80  12/10/18 114/60  07/19/18 116/62    Wt Readings from Last 3 Encounters:  02/24/19 241 lb (109.3 kg)  12/10/18 250 lb (113.4 kg)  07/19/18 234 lb 1.3 oz (106.2 kg)    Physical Exam Vitals signs reviewed.  Constitutional:      Appearance: He is obese. He is not ill-appearing or diaphoretic.  HENT:     Nose: Nose normal. No congestion or rhinorrhea.     Mouth/Throat:     Mouth: Mucous membranes are moist.     Pharynx: Oropharynx is clear. No oropharyngeal exudate or posterior oropharyngeal erythema.  Eyes:     General: No scleral icterus.    Conjunctiva/sclera: Conjunctivae normal.  Neck:     Musculoskeletal: Normal range of motion and neck supple.  Cardiovascular:     Rate and Rhythm: Normal rate and regular rhythm.     Heart sounds: No murmur. No friction rub. No gallop.   Pulmonary:     Effort: Pulmonary effort is normal.     Breath sounds: No stridor. No wheezing, rhonchi or rales.  Abdominal:     General: Bowel sounds are normal.     Palpations: There is no hepatomegaly,  splenomegaly or mass.     Tenderness: There is no abdominal tenderness. There is no guarding.  Genitourinary:    Comments: GU and rectal exams were deferred at his request.  He tells me he is seeing a urologist soon and will have this exam done there. Musculoskeletal: Normal range of motion.        General: No swelling.     Right lower leg: Edema (1+ pitting) present.     Left lower leg: Edema (1+ pitting) present.  Skin:    General: Skin is warm and dry.     Findings: No rash.  Neurological:     General: No focal deficit present.     Mental Status: He is oriented to person, place, and time. Mental status is at baseline.     Lab Results  Component Value Date   WBC 5.8 02/24/2019   HGB 12.1 (L) 02/24/2019   HCT 37.8 (L) 02/24/2019   PLT 224.0 02/24/2019   GLUCOSE 133 (H) 02/24/2019   CHOL 104 02/24/2019   TRIG 146.0 02/24/2019   HDL 25.50 (L) 02/24/2019   LDLDIRECT 82.7 04/04/2013   LDLCALC 49 02/24/2019   ALT 13 02/24/2019   AST 13 02/24/2019   NA 138 02/24/2019   K 4.3 02/24/2019   CL 103 02/24/2019   CREATININE 1.14 02/24/2019   BUN 16 02/24/2019   CO2 25 02/24/2019   TSH 1.15 02/24/2019   PSA 4.08 (H) 02/24/2019   INR 0.95 08/17/2010   HGBA1C 7.4 (H) 02/24/2019   MICROALBUR 4.2 (H) 02/24/2019    Vas [place] Lower Extremity Venous (dvt)  Result Date: 07/23/2018  Lower Venous DVT Study Indications: Edema, bilaterally. Denies SOB.  Risk Factors: None identified. Performing Technologist: Sharlett Iles RVT  Examination Guidelines: A complete evaluation includes B-mode imaging, spectral Doppler, color Doppler, and power Doppler as needed of all accessible portions of each vessel. Bilateral  testing is considered an integral part of a complete examination. Limited examinations for reoccurring indications may be performed as noted. The reflux portion of the exam is performed with the patient in reverse Trendelenburg.  Right Venous Findings:  +---------+---------------+---------+-----------+----------+-------+          CompressibilityPhasicitySpontaneityPropertiesSummary +---------+---------------+---------+-----------+----------+-------+ CFV      Full           Yes      Yes                          +---------+---------------+---------+-----------+----------+-------+ SFJ      Full           Yes      Yes                          +---------+---------------+---------+-----------+----------+-------+ FV Prox  Full           Yes      Yes                          +---------+---------------+---------+-----------+----------+-------+ FV Mid   Full                                                 +---------+---------------+---------+-----------+----------+-------+ FV DistalFull           Yes      Yes                          +---------+---------------+---------+-----------+----------+-------+ PFV      Full           Yes      Yes                          +---------+---------------+---------+-----------+----------+-------+ POP      Full           Yes      Yes                          +---------+---------------+---------+-----------+----------+-------+ PTV      Full           Yes      No                           +---------+---------------+---------+-----------+----------+-------+ PERO     Full           Yes      No                           +---------+---------------+---------+-----------+----------+-------+ Gastroc  Full                                                 +---------+---------------+---------+-----------+----------+-------+ GSV      Full           Yes      Yes                          +---------+---------------+---------+-----------+----------+-------+  Left Venous Findings: +---------+---------------+---------+-----------+----------+-------+          CompressibilityPhasicitySpontaneityPropertiesSummary  +---------+---------------+---------+-----------+----------+-------+ CFV      Full           Yes      Yes                          +---------+---------------+---------+-----------+----------+-------+ SFJ      Full           Yes      Yes                          +---------+---------------+---------+-----------+----------+-------+ FV Prox  Full           Yes      Yes                          +---------+---------------+---------+-----------+----------+-------+ FV Mid   Full                                                 +---------+---------------+---------+-----------+----------+-------+ FV DistalFull           Yes      Yes                          +---------+---------------+---------+-----------+----------+-------+ PFV      Full           Yes      Yes                          +---------+---------------+---------+-----------+----------+-------+ POP      Full           Yes      Yes                          +---------+---------------+---------+-----------+----------+-------+ PTV      Full           Yes      No                           +---------+---------------+---------+-----------+----------+-------+ PERO     Full           Yes      No                           +---------+---------------+---------+-----------+----------+-------+ Gastroc  Full                                                 +---------+---------------+---------+-----------+----------+-------+ GSV      Full           Yes      Yes                          +---------+---------------+---------+-----------+----------+-------+    Final Interpretation: Right: No evidence of deep vein thrombosis in the lower extremity. No indirect evidence of obstruction proximal to the inguinal ligament. Left: No evidence of deep vein thrombosis in the  lower extremity. No indirect evidence of obstruction proximal to the inguinal ligament.  *See table(s) above for measurements and observations.  Electronically signed by Ida Rogue MD on 07/23/2018 at 7:37:05 PM.    Final     Assessment & Plan:   Jahree was seen today for annual exam, anemia, hypertension, diabetes and hyperlipidemia.  Diagnoses and all orders for this visit:  Chronic diastolic heart failure ([place])- His blood pressure is not adequately well controlled on the current ARB.  I have asked him to upgrade to a more potent ARB.  He also has lower extremity edema so will add a thiazide diuretic. -     olmesartan-hydrochlorothiazide (BENICAR HCT) 40-12.5 MG tablet; Take 1 tablet by mouth daily.  Essential hypertension- See above. -     Comprehensive metabolic panel; Future -     Urinalysis, Routine w reflex microscopic; Future -     olmesartan-hydrochlorothiazide (BENICAR HCT) 40-12.5 MG tablet; Take 1 tablet by mouth daily.  Type 2 diabetes mellitus with complication ([place])- His Z6X is at 7.4%.  His blood sugars are adequately well controlled.  Will continue the current regimen. -     Microalbumin / creatinine urine ratio; Future -     Comprehensive metabolic panel; Future -     Hemoglobin A1c; Future -     olmesartan-hydrochlorothiazide (BENICAR HCT) 40-12.5 MG tablet; Take 1 tablet by mouth daily. -     Ambulatory referral to Ophthalmology  Benign prostatic hyperplasia without lower urinary tract symptoms- His PSA is up to 4.08.  If he sees a urologist then this will be addressed there otherwise I  have asked him to return to see me in about 3 months for a prostate exam and to recheck his PSA. -     PSA; Future  Vitamin B12 deficiency anemia due to intrinsic factor deficiency -     CBC with Differential/Platelet; Future -     Vitamin B12; Future -     Folate; Future -     cyanocobalamin 2000 MCG tablet; Take 1 tablet (2,000 mcg total) by mouth daily.  Hyperlipidemia LDL goal <70- He has achieved his LDL goal is doing well on the statin. -     Lipid panel; Future -     TSH; Future  Routine general medical  examination at a health care facility  Need for hepatitis C screening test -     Hepatitis C antibody; Future  Polyp of colon, unspecified part of colon, unspecified type -     Ambulatory referral to Gastroenterology  Need for influenza vaccination -     Flu vaccine HIGH DOSE PF (Fluzone High dose)  Atherosclerosis of native coronary artery of native heart without angina pectoris -     nitroGLYCERIN (NITROSTAT) 0.4 MG SL tablet; Place 1 tablet (0.4 mg total) under the tongue every 5 (five) minutes as needed for chest pain.  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  Lumbar radiculopathy -     traMADol (ULTRAM) 50 MG tablet; Take 1 tablet (50 mg total) by mouth every 8 (eight) hours as needed.  Postlaminectomy syndrome, lumbar region -     traMADol (ULTRAM) 50 MG tablet; Take 1 tablet (50 mg total) by mouth every 8 (eight) hours as needed.   I have discontinued Carloyn Manner B. Parkin's oxyCODONE-acetaminophen and losartan.  I have also changed his traMADol. Additionally, I am having him start on olmesartan-hydrochlorothiazide. Lastly, I am having him maintain his tamsulosin, glucose blood, Prodigy Lancets 28G, ezetimibe, insulin NPH-regular Human, Insulin Syringe-Needle U-100, Janumet, fluticasone, omega-3 acid ethyl esters, ranolazine, FreeStyle Libre 14 Day Reader, atorvastatin, levocetirizine, HYDROcodone-acetaminophen, insulin lispro protamine-lispro, aspirin, carvedilol, isosorbide mononitrate, pantoprazole, gabapentin, ketoconazole, FreeStyle Libre Sensor System, cyanocobalamin, and nitroGLYCERIN.  Meds ordered this encounter  Medications  . cyanocobalamin 2000 MCG tablet    Sig: Take 1 tablet (2,000 mcg total) by mouth daily.    Dispense:  90 tablet    Refill:  1  . nitroGLYCERIN (NITROSTAT) 0.4 MG SL tablet     Sig: Place 1 tablet (0.4 mg total) under the tongue every 5 (five) minutes as needed for chest pain.    Dispense:  25 tablet    Refill:  3  . olmesartan-hydrochlorothiazide (BENICAR HCT) 40-12.5 MG tablet    Sig: Take 1 tablet by mouth daily.    Dispense:  90 tablet    Refill:  1  . traMADol (ULTRAM) 50 MG tablet    Sig: Take 1 tablet (50 mg total) by mouth every 8 (eight) hours as needed.    Dispense:  270 tablet    Refill:  1    This request is for a new prescription for a controlled substance as required by Federal/State law..   See AVS for instructions about healthy living and anticipatory guidance.  Follow-up: Return in about 3 months (around 05/27/2019).  [name], MD

## 2019-02-25 ENCOUNTER — Encounter: Payer: Self-pay | Admitting: Internal Medicine

## 2019-02-25 LAB — HEPATITIS C ANTIBODY
Hepatitis C Ab: NONREACTIVE
SIGNAL TO CUT-OFF: 0.12 (ref <1.00)

## 2019-02-28 DIAGNOSIS — G4733 Obstructive sleep apnea (adult) (pediatric): Secondary | ICD-10-CM | POA: Diagnosis not present

## 2019-02-28 NOTE — Assessment & Plan Note (Signed)

## 2019-03-09 ENCOUNTER — Other Ambulatory Visit: Payer: Self-pay | Admitting: Internal Medicine

## 2019-03-15 ENCOUNTER — Other Ambulatory Visit: Payer: Self-pay | Admitting: Internal Medicine

## 2019-03-15 DIAGNOSIS — E785 Hyperlipidemia, unspecified: Secondary | ICD-10-CM

## 2019-03-15 DIAGNOSIS — I2510 Atherosclerotic heart disease of native coronary artery without angina pectoris: Secondary | ICD-10-CM

## 2019-03-17 ENCOUNTER — Other Ambulatory Visit: Payer: Self-pay | Admitting: Internal Medicine

## 2019-03-17 ENCOUNTER — Telehealth: Payer: Self-pay | Admitting: Internal Medicine

## 2019-03-17 DIAGNOSIS — H401133 Primary open-angle glaucoma, bilateral, severe stage: Secondary | ICD-10-CM | POA: Insufficient documentation

## 2019-03-17 DIAGNOSIS — H10011 Acute follicular conjunctivitis, right eye: Secondary | ICD-10-CM | POA: Diagnosis not present

## 2019-03-17 NOTE — Telephone Encounter (Signed)
Spoke to pt's wife regarding his ophthalmology referral. Pt is having some issues with his prosthetic cornea. Can you please enter new referral as urgent and diagnosis regarding this. She stated they talked about this at his appt on 3/5.   Copied from [place] [phone]. Topic: Referral - Request for Referral >> Mar 16, 2019  4:33 PM Vernona Rieger wrote: Patient's wife was advised by [facility] that they have not received the referral yet. She needs it to go to [facility] - Dr Dondra Prader - fax number [phone] and she would like a call back when that is done. Thanks

## 2019-03-17 NOTE — Telephone Encounter (Signed)
Urgent referral entered. 

## 2019-03-17 NOTE — Telephone Encounter (Signed)
Pt spouse stated that pt has blood behind the prosthetic cornea.  Request for new URGENT referral to be entered. Not sure of the diagnosis code to use for the referral.

## 2019-03-18 ENCOUNTER — Other Ambulatory Visit: Payer: Self-pay | Admitting: Internal Medicine

## 2019-04-01 ENCOUNTER — Other Ambulatory Visit: Payer: Self-pay | Admitting: Podiatry

## 2019-04-01 DIAGNOSIS — B353 Tinea pedis: Secondary | ICD-10-CM

## 2019-04-22 ENCOUNTER — Other Ambulatory Visit: Payer: Self-pay | Admitting: Internal Medicine

## 2019-04-22 DIAGNOSIS — E118 Type 2 diabetes mellitus with unspecified complications: Secondary | ICD-10-CM

## 2019-04-22 DIAGNOSIS — Z794 Long term (current) use of insulin: Secondary | ICD-10-CM

## 2019-04-28 ENCOUNTER — Other Ambulatory Visit: Payer: Self-pay

## 2019-04-28 MED ORDER — "INSULIN SYRINGE-NEEDLE U-100 30G X 1/2"" 1 ML MISC": 3 refills | Status: DC

## 2019-05-11 ENCOUNTER — Encounter: Payer: Self-pay | Admitting: Podiatry

## 2019-05-11 ENCOUNTER — Other Ambulatory Visit: Payer: Self-pay

## 2019-05-11 ENCOUNTER — Ambulatory Visit (INDEPENDENT_AMBULATORY_CARE_PROVIDER_SITE_OTHER): Payer: PPO | Admitting: Podiatry

## 2019-05-11 VITALS — Temp 98.1°F

## 2019-05-11 DIAGNOSIS — M79676 Pain in unspecified toe(s): Secondary | ICD-10-CM

## 2019-05-11 DIAGNOSIS — B351 Tinea unguium: Secondary | ICD-10-CM

## 2019-05-11 DIAGNOSIS — M79609 Pain in unspecified limb: Secondary | ICD-10-CM

## 2019-05-20 DIAGNOSIS — G4733 Obstructive sleep apnea (adult) (pediatric): Secondary | ICD-10-CM | POA: Diagnosis not present

## 2019-05-22 ENCOUNTER — Encounter: Payer: Self-pay | Admitting: Podiatry

## 2019-05-22 NOTE — Progress Notes (Signed)
Subjective:  [name] presents to clinic today with cc of  painful, thick, discolored, elongated toenails 1-5 b/l that become tender and cannot cut because of thickness.  Pain is aggravated when wearing enclosed shoe gear.  [name], MD is his PCP.    Current Outpatient Medications:  .  amoxicillin-clavulanate (AUGMENTIN) 500-125 MG tablet, Take 1 tablet by mouth every 12 (twelve) hours., Disp: , Rfl:  .  aspirin 81 MG EC tablet, Take 1 tablet (81 mg total) by mouth daily. Swallow whole., Disp: 90 tablet, Rfl: 1 .  atorvastatin (LIPITOR) 80 MG tablet, TAKE 1 TABLET (80 MG TOTAL) BY MOUTH DAILY AT 6 PM., Disp: 90 tablet, Rfl: 1 .  carvedilol (COREG) 12.5 MG tablet, TAKE 1 TABLET BY MOUTH TWICE A DAY WITH A MEAL, Disp: 180 tablet, Rfl: 3 .  ciprofloxacin (CILOXAN) 0.3 % ophthalmic solution, PLACE 1 DROP INTO AFFECTED EYE EVERY 2 HOURS FOR 2 DAYS THEN 4 TIMES DAILYX 1 WEEK, Disp: , Rfl:  .  Continuous Blood Gluc Receiver (FREESTYLE LIBRE 14 DAY READER) DEVI, USE AS DIRECTED FOR READINGS, Disp: 1 Device, Rfl: 11 .  Continuous Blood Gluc Sensor (FREESTYLE LIBRE SENSOR SYSTEM) MISC, Use 1 sensor every 14 days, then replace with new one., Disp: 6 each, Rfl: 3 .  cyanocobalamin 2000 MCG tablet, Take 1 tablet (2,000 mcg total) by mouth daily., Disp: 90 tablet, Rfl: 1 .  ezetimibe (ZETIA) 10 MG tablet, Take 1 tablet (10 mg total) by mouth daily., Disp: 90 tablet, Rfl: 3 .  fluticasone (FLONASE) 50 MCG/ACT nasal spray, SPRAY 2 SPRAYS INTO EACH NOSTRIL EVERY DAY, Disp: 48 g, Rfl: 3 .  gabapentin (NEURONTIN) 300 MG capsule, TAKE 1 CAPSULE BY MOUTH 3 TIMES A DAY, Disp: 270 capsule, Rfl: 2 .  glucose blood (ONETOUCH VERIO) test strip, Use BID to check BS. DX E11.9, Disp: 100 each, Rfl: 12 .  HYDROcodone-acetaminophen (NORCO) 7.5-325 MG tablet, TAKE 1 TABLET BY MOUTH EVERY 4 TO 6 HOURS AS NEEDED FOR PAIN MAX 8 TABS PER DAY, Disp: , Rfl:  .  insulin lispro protamine-lispro (HUMALOG 75/25 MIX)  (75-25) 100 UNIT/ML SUSP injection, , Disp: , Rfl:  .  insulin NPH-regular Human (NOVOLIN 70/30) (70-30) 100 UNIT/ML injection, INJECT 85 UINTS SUBCUTANEOUSLY IN THE AM AND 45 UNITS IN THE PM, Disp: 110 mL, Rfl: 3 .  Insulin Syringe-Needle U-100 (B-D INS SYR ULTRAFINE 1CC/30G) 30G X 1/2" 1 ML MISC, Use to inject insulin twice daily., Disp: 200 each, Rfl: 3 .  isosorbide mononitrate (IMDUR) 120 MG 24 hr tablet, Take 1 tablet (120 mg total) by mouth daily., Disp: 90 tablet, Rfl: 3 .  JANUMET 50-1000 MG tablet, TAKE 1 TABLET BY MOUTH TWICE A DAY WITH A MEAL, Disp: 180 tablet, Rfl: 1 .  ketoconazole (NIZORAL) 2 % cream, APPLY TO BOTH FEET AND BETWEEN TOES ONCE DAILY FOR 6 WEEKS., Disp: 30 g, Rfl: 1 .  levocetirizine (XYZAL) 5 MG tablet, Take 1 tablet (5 mg total) by mouth every evening., Disp: 90 tablet, Rfl: 3 .  losartan (COZAAR) 25 MG tablet, Take 50 mg by mouth daily., Disp: , Rfl:  .  nitroGLYCERIN (NITROSTAT) 0.4 MG SL tablet, Place 1 tablet (0.4 mg total) under the tongue every 5 (five) minutes as needed for chest pain., Disp: 25 tablet, Rfl: 3 .  olmesartan-hydrochlorothiazide (BENICAR HCT) 40-12.5 MG tablet, Take 1 tablet by mouth daily., Disp: 90 tablet, Rfl: 1 .  omega-3 acid ethyl esters (LOVAZA) 1 g capsule, TAKE  2 CAPSULES (2 G TOTAL) BY MOUTH 2 (TWO) TIMES DAILY., Disp: 360 capsule, Rfl: 1 .  pantoprazole (PROTONIX) 40 MG tablet, TAKE 1 TABLET BY MOUTH TWICE A DAY, Disp: 180 tablet, Rfl: 3 .  PRODIGY LANCETS 28G MISC, Use to check blood sugars twice a day Dx e11.9, Disp: 100 each, Rfl: 3 .  ranolazine (RANEXA) 500 MG 12 hr tablet, Take 1 tablet (500 mg total) by mouth 2 (two) times daily., Disp: 180 tablet, Rfl: 3 .  tamsulosin (FLOMAX) 0.4 MG CAPS capsule, Take 1 capsule (0.4 mg total) by mouth daily., Disp: 15 capsule, Rfl: 1 .  traMADol (ULTRAM) 50 MG tablet, Take 1 tablet (50 mg total) by mouth every 8 (eight) hours as needed., Disp: 270 tablet, Rfl: 1   Allergies  Allergen Reactions   . Furosemide     pancreatitis  . Amlodipine Swelling    LE EDEMA  . Diamox [Acetazolamide] Itching    hives  . Lisinopril Rash     Objective: Vitals:   05/11/19 1445  Temp: 98.1 F (36.7 C)    Physical Examination:  Neurovascular Examination: Capillary refill time <3 seconds x 10 digits.  Nonpalpable DP/PT pulses b/l.  Digital hair absent b/l.  No edema noted b/l.  Skin temperature gradient WNL b/l.  Dermatological Examination: Skin with normal turgor, texture and tone b/l.  No open wounds b/l.  No interdigital macerations noted b/l.  Tinea resolved b/l feet.  Elongated, thick, discolored brittle toenails with subungual debris and pain on dorsal palpation of nailbeds 1-5 b/l.  Musculoskeletal Examination: Muscle strength 5/5 to all muscle groups LLE.   Dorsiflexion 0/5 RLE; 5/5 plantarflexion, inversion and eversion.   No pain, crepitus or joint discomfort with active/passive ROM.  Neurological Examination: Sensation intact 5/5 b/l  with 10 gram monofilament.  Foot drop RLE.  Assessment: Mycotic nail infection with pain 1-5 b/l  Plan: 1. Toenails 1-5 b/l were debrided in length and girth without iatrogenic laceration. 2.  Continue soft, supportive shoe gear daily. 3.  Report any pedal injuries to medical professional. 4.  Follow up 3 months. 5.  Patient/POA to call should there be a question/concern in there interim.

## 2019-05-26 ENCOUNTER — Other Ambulatory Visit (INDEPENDENT_AMBULATORY_CARE_PROVIDER_SITE_OTHER): Payer: PPO

## 2019-05-26 ENCOUNTER — Ambulatory Visit (INDEPENDENT_AMBULATORY_CARE_PROVIDER_SITE_OTHER): Payer: PPO | Admitting: Internal Medicine

## 2019-05-26 ENCOUNTER — Encounter: Payer: Self-pay | Admitting: Internal Medicine

## 2019-05-26 ENCOUNTER — Other Ambulatory Visit: Payer: PPO

## 2019-05-26 ENCOUNTER — Other Ambulatory Visit: Payer: Self-pay

## 2019-05-26 VITALS — BP 124/68 | HR 76 | Temp 98.5°F | Resp 16 | Ht 70.0 in | Wt 242.2 lb

## 2019-05-26 DIAGNOSIS — I10 Essential (primary) hypertension: Secondary | ICD-10-CM | POA: Diagnosis not present

## 2019-05-26 DIAGNOSIS — I5042 Chronic combined systolic (congestive) and diastolic (congestive) heart failure: Secondary | ICD-10-CM | POA: Diagnosis not present

## 2019-05-26 DIAGNOSIS — R972 Elevated prostate specific antigen [PSA]: Secondary | ICD-10-CM

## 2019-05-26 DIAGNOSIS — E118 Type 2 diabetes mellitus with unspecified complications: Secondary | ICD-10-CM

## 2019-05-26 DIAGNOSIS — H10401 Unspecified chronic conjunctivitis, right eye: Secondary | ICD-10-CM | POA: Diagnosis not present

## 2019-05-26 DIAGNOSIS — D539 Nutritional anemia, unspecified: Secondary | ICD-10-CM

## 2019-05-26 DIAGNOSIS — I5032 Chronic diastolic (congestive) heart failure: Secondary | ICD-10-CM | POA: Diagnosis not present

## 2019-05-26 DIAGNOSIS — H44001 Unspecified purulent endophthalmitis, right eye: Secondary | ICD-10-CM | POA: Diagnosis not present

## 2019-05-26 DIAGNOSIS — D510 Vitamin B12 deficiency anemia due to intrinsic factor deficiency: Secondary | ICD-10-CM

## 2019-05-26 LAB — CBC WITH DIFFERENTIAL/PLATELET
Basophils Absolute: 0 10*3/uL (ref 0.0–0.1)
Basophils Relative: 0.5 % (ref 0.0–3.0)
Eosinophils Absolute: 0.3 10*3/uL (ref 0.0–0.7)
Eosinophils Relative: 4.6 % (ref 0.0–5.0)
HCT: 31.3 % — ABNORMAL LOW (ref 39.0–52.0)
Hemoglobin: 10.4 g/dL — ABNORMAL LOW (ref 13.0–17.0)
Lymphocytes Relative: 40.4 % (ref 12.0–46.0)
Lymphs Abs: 2.5 10*3/uL (ref 0.7–4.0)
MCHC: 33.1 g/dL (ref 30.0–36.0)
MCV: 71.6 fl — ABNORMAL LOW (ref 78.0–100.0)
Monocytes Absolute: 0.4 10*3/uL (ref 0.1–1.0)
Monocytes Relative: 7.2 % (ref 3.0–12.0)
Neutro Abs: 2.9 10*3/uL (ref 1.4–7.7)
Neutrophils Relative %: 47.3 % (ref 43.0–77.0)
Platelets: 218 10*3/uL (ref 150.0–400.0)
RBC: 4.38 Mil/uL (ref 4.22–5.81)
RDW: 15.6 % — ABNORMAL HIGH (ref 11.5–15.5)
WBC: 6.2 10*3/uL (ref 4.0–10.5)

## 2019-05-26 LAB — IBC PANEL
Iron: 69 ug/dL (ref 42–165)
Saturation Ratios: 26.1 % (ref 20.0–50.0)
Transferrin: 189 mg/dL — ABNORMAL LOW (ref 212.0–360.0)

## 2019-05-26 LAB — FERRITIN: Ferritin: 111.4 ng/mL (ref 22.0–322.0)

## 2019-05-26 LAB — VITAMIN B12: Vitamin B-12: 913 pg/mL — ABNORMAL HIGH (ref 211–911)

## 2019-05-26 LAB — HEMOGLOBIN A1C: Hgb A1c MFr Bld: 7.8 % — ABNORMAL HIGH (ref 4.6–6.5)

## 2019-05-26 MED ORDER — CIPROFLOXACIN HCL 0.3 % OP SOLN: OPHTHALMIC | 0 refills | Status: DC

## 2019-05-26 MED ORDER — OLMESARTAN MEDOXOMIL-HCTZ 40-12.5 MG PO TABS: 1.0000 | ORAL_TABLET | Freq: Every day | ORAL | 1 refills | Status: DC

## 2019-05-26 NOTE — Patient Instructions (Signed)
Type 2 Diabetes Mellitus, Diagnosis, Adult Type 2 diabetes (type 2 diabetes mellitus) is a long-term (chronic) disease. In type 2 diabetes, one or both of these problems may be present:  The pancreas does not make enough of a hormone called insulin.  Cells in the body do not respond properly to insulin that the body makes (insulin resistance). Normally, insulin allows blood sugar (glucose) to enter cells in the body. The cells use glucose for energy. Insulin resistance or lack of insulin causes excess glucose to build up in the blood instead of going into cells. As a result, high blood glucose (hyperglycemia) develops. What increases the risk? The following factors may make you more likely to develop type 2 diabetes:  Having a family member with type 2 diabetes.  Being overweight or obese.  Having an inactive (sedentary) lifestyle.  Having been diagnosed with insulin resistance.  Having a history of prediabetes, gestational diabetes, or polycystic ovary syndrome (PCOS).  Being of American-Indian, African-American, Hispanic/Latino, or Asian/Pacific Islander descent. What are the signs or symptoms? In the early stage of this condition, you may not have symptoms. Symptoms develop slowly and may include:  Increased thirst (polydipsia).  Increased hunger(polyphagia).  Increased urination (polyuria).  Increased urination during the night (nocturia).  Unexplained weight loss.  Frequent infections that keep coming back (recurring).  Fatigue.  Weakness.  Vision changes, such as blurry vision.  Cuts or bruises that are slow to heal.  Tingling or numbness in the hands or feet.  Dark patches on the skin (acanthosis nigricans). How is this diagnosed? This condition is diagnosed based on your symptoms, your medical history, a physical exam, and your blood glucose level. Your blood glucose may be checked with one or more of the following blood tests:  A fasting blood glucose (FBG)  test. You will not be allowed to eat (you will fast) for 8 hours or longer before a blood sample is taken.  A random blood glucose test. This test checks blood glucose at any time of day regardless of when you ate.  An A1c (hemoglobin A1c) blood test. This test provides information about blood glucose control over the previous 2-3 months.  An oral glucose tolerance test (OGTT). This test measures your blood glucose at two times: ? After fasting. This is your baseline blood glucose level. ? Two hours after drinking a beverage that contains glucose. You may be diagnosed with type 2 diabetes if:  Your FBG level is 126 mg/dL (7.0 mmol/L) or higher.  Your random blood glucose level is 200 mg/dL (11.1 mmol/L) or higher.  Your A1c level is 6.5% or higher.  Your OGTT result is higher than 200 mg/dL (11.1 mmol/L). These blood tests may be repeated to confirm your diagnosis. How is this treated? Your treatment may be managed by a specialist called an endocrinologist. Type 2 diabetes may be treated by following instructions from your health care provider about:  Making diet and lifestyle changes. This may include: ? Following an individualized nutrition plan that is developed by a diet and nutrition specialist (registered dietitian). ? Exercising regularly. ? Finding ways to manage stress.  Checking your blood glucose level as often as told.  Taking diabetes medicines or insulin daily. This helps to keep your blood glucose levels in the healthy range. ? If you use insulin, you may need to adjust the dosage depending on how physically active you are and what foods you eat. Your health care provider will tell you how to adjust your dosage.    Taking medicines to help prevent complications from diabetes, such as: ? Aspirin. ? Medicine to lower cholesterol. ? Medicine to control blood pressure. Your health care provider will set individualized treatment goals for you. Your goals will be based on  your age, other medical conditions you have, and how you respond to diabetes treatment. Generally, the goal of treatment is to maintain the following blood glucose levels:  Before meals (preprandial): 80-130 mg/dL (4.4-7.2 mmol/L).  After meals (postprandial): below 180 mg/dL (10 mmol/L).  A1c level: less than 7%. Follow these instructions at home: Questions to ask your health care provider  Consider asking the following questions: ? Do I need to meet with a diabetes educator? ? Where can I find a support group for people with diabetes? ? What equipment will I need to manage my diabetes at home? ? What diabetes medicines do I need, and when should I take them? ? How often do I need to check my blood glucose? ? What number can I call if I have questions? ? When is my next appointment? General instructions  Take over-the-counter and prescription medicines only as told by your health care provider.  Keep all follow-up visits as told by your health care provider. This is important.  For more information about diabetes, visit: ? American Diabetes Association (ADA): www.diabetes.org ? American Association of Diabetes Educators (AADE): www.diabeteseducator.org Contact a health care provider if:  Your blood glucose is at or above 240 mg/dL (13.3 mmol/L) for 2 days in a row.  You have been sick or have had a fever for 2 days or longer, and you are not getting better.  You have any of the following problems for more than 6 hours: ? You cannot eat or drink. ? You have nausea and vomiting. ? You have diarrhea. Get help right away if:  Your blood glucose is lower than 54 mg/dL (3.0 mmol/L).  You become confused or you have trouble thinking clearly.  You have difficulty breathing.  You have moderate or large ketone levels in your urine. Summary  Type 2 diabetes (type 2 diabetes mellitus) is a long-term (chronic) disease. In type 2 diabetes, the pancreas does not make enough of a  hormone called insulin, or cells in the body do not respond properly to insulin that the body makes (insulin resistance).  This condition is treated by making diet and lifestyle changes and taking diabetes medicines or insulin.  Your health care provider will set individualized treatment goals for you. Your goals will be based on your age, other medical conditions you have, and how you respond to diabetes treatment.  Keep all follow-up visits as told by your health care provider. This is important. This information is not intended to replace advice given to you by your health care provider. Make sure you discuss any questions you have with your health care provider. Document Released: 12/08/2005 Document Revised: 07/09/2017 Document Reviewed: 01/11/2016 Elsevier Interactive Patient Education  2019 Elsevier Inc.  

## 2019-05-26 NOTE — Progress Notes (Signed)
Subjective:  Patient ID: [name], male    DOB: Dec 03, 1951  Age: 68 y.o. MRN: [id number]  CC: Hypertension; Diabetes; and Anemia   HPI [name] presents for f/up - He complains of a recurrent discharge from his right eye over the last few weeks.  He tells me he saw his local ophthalmologist about 2 months ago and was placed on Cipro eyedrops.  The discharge got better but has now returned.  He tells me he is going to see an ophthalmologist at [facility] soon.  He does not monitor his blood sugar or his blood pressure.  He denies any recent episodes of polys.  He denies headache, chest pain, shortness of breath, or edema.  Outpatient Medications Prior to Visit  Medication Sig Dispense Refill  . aspirin 81 MG EC tablet Take 1 tablet (81 mg total) by mouth daily. Swallow whole. 90 tablet 1  . atorvastatin (LIPITOR) 80 MG tablet TAKE 1 TABLET (80 MG TOTAL) BY MOUTH DAILY AT 6 PM. 90 tablet 1  . carvedilol (COREG) 12.5 MG tablet TAKE 1 TABLET BY MOUTH TWICE A DAY WITH A MEAL 180 tablet 3  . Continuous Blood Gluc Receiver (FREESTYLE LIBRE 14 DAY READER) DEVI USE AS DIRECTED FOR READINGS 1 Device 11  . Continuous Blood Gluc Sensor (FREESTYLE LIBRE SENSOR SYSTEM) MISC Use 1 sensor every 14 days, then replace with new one. 6 each 3  . cyanocobalamin 2000 MCG tablet Take 1 tablet (2,000 mcg total) by mouth daily. 90 tablet 1  . ezetimibe (ZETIA) 10 MG tablet Take 1 tablet (10 mg total) by mouth daily. 90 tablet 3  . fluticasone (FLONASE) 50 MCG/ACT nasal spray SPRAY 2 SPRAYS INTO EACH NOSTRIL EVERY DAY 48 g 3  . gabapentin (NEURONTIN) 300 MG capsule TAKE 1 CAPSULE BY MOUTH 3 TIMES A DAY 270 capsule 2  . glucose blood (ONETOUCH VERIO) test strip Use BID to check BS. DX E11.9 100 each 12  . HYDROcodone-acetaminophen (NORCO) 7.5-325 MG tablet TAKE 1 TABLET BY MOUTH EVERY 4 TO 6 HOURS AS NEEDED FOR PAIN MAX 8 TABS PER DAY    . insulin lispro protamine-lispro (HUMALOG 75/25 MIX) (75-25) 100 UNIT/ML  SUSP injection     . insulin NPH-regular Human (NOVOLIN 70/30) (70-30) 100 UNIT/ML injection INJECT 85 UINTS SUBCUTANEOUSLY IN THE AM AND 45 UNITS IN THE PM 110 mL 3  . Insulin Syringe-Needle U-100 (B-D INS SYR ULTRAFINE 1CC/30G) 30G X 1/2" 1 ML MISC Use to inject insulin twice daily. 200 each 3  . isosorbide mononitrate (IMDUR) 120 MG 24 hr tablet Take 1 tablet (120 mg total) by mouth daily. 90 tablet 3  . JANUMET 50-1000 MG tablet TAKE 1 TABLET BY MOUTH TWICE A DAY WITH A MEAL 180 tablet 1  . ketoconazole (NIZORAL) 2 % cream APPLY TO BOTH FEET AND BETWEEN TOES ONCE DAILY FOR 6 WEEKS. 30 g 1  . levocetirizine (XYZAL) 5 MG tablet Take 1 tablet (5 mg total) by mouth every evening. 90 tablet 3  . losartan (COZAAR) 25 MG tablet Take 50 mg by mouth daily.    Marland Kitchen omega-3 acid ethyl esters (LOVAZA) 1 g capsule TAKE 2 CAPSULES (2 G TOTAL) BY MOUTH 2 (TWO) TIMES DAILY. 360 capsule 1  . pantoprazole (PROTONIX) 40 MG tablet TAKE 1 TABLET BY MOUTH TWICE A DAY 180 tablet 3  . ranolazine (RANEXA) 500 MG 12 hr tablet Take 1 tablet (500 mg total) by mouth 2 (two) times daily. 180 tablet 3  .  tamsulosin (FLOMAX) 0.4 MG CAPS capsule Take 1 capsule (0.4 mg total) by mouth daily. 15 capsule 1  . traMADol (ULTRAM) 50 MG tablet Take 1 tablet (50 mg total) by mouth every 8 (eight) hours as needed. 270 tablet 1  . ciprofloxacin (CILOXAN) 0.3 % ophthalmic solution PLACE 1 DROP INTO AFFECTED EYE EVERY 2 HOURS FOR 2 DAYS THEN 4 TIMES DAILYX 1 WEEK    . olmesartan-hydrochlorothiazide (BENICAR HCT) 40-12.5 MG tablet Take 1 tablet by mouth daily. 90 tablet 1  . nitroGLYCERIN (NITROSTAT) 0.4 MG SL tablet Place 1 tablet (0.4 mg total) under the tongue every 5 (five) minutes as needed for chest pain. 25 tablet 3  . amoxicillin-clavulanate (AUGMENTIN) 500-125 MG tablet Take 1 tablet by mouth every 12 (twelve) hours.    Marland Kitchen PRODIGY LANCETS 28G MISC Use to check blood sugars twice a day Dx e11.9 100 each 3   No facility-administered  medications prior to visit.     ROS Review of Systems  Constitutional: Negative.  Negative for diaphoresis, fatigue and unexpected weight change.  HENT: Negative.   Eyes: Positive for discharge and redness. Negative for visual disturbance.  Respiratory: Negative.  Negative for cough, chest tightness, shortness of breath and wheezing.   Cardiovascular: Negative for chest pain, palpitations and leg swelling.  Gastrointestinal: Negative for abdominal pain, constipation, diarrhea, nausea and vomiting.  Endocrine: Negative for polydipsia, polyphagia and polyuria.  Genitourinary: Negative.  Negative for difficulty urinating, dysuria and hematuria.  Musculoskeletal: Negative.  Negative for arthralgias and myalgias.  Neurological: Negative.  Negative for dizziness, weakness and light-headedness.  Hematological: Negative for adenopathy. Does not bruise/bleed easily.  Psychiatric/Behavioral: Negative.     Objective:  BP 124/68 (BP Location: Left Arm, Patient Position: Sitting, Cuff Size: Normal)   Pulse 76   Temp 98.5 F (36.9 C) (Oral)   Resp 16   Ht 5\' 10"  (1.778 m)   Wt 242 lb 4 oz (109.9 kg)   SpO2 96%   BMI 34.76 kg/m   BP Readings from Last 3 Encounters:  05/26/19 124/68  02/24/19 (!) 170/80  12/10/18 114/60    Wt Readings from Last 3 Encounters:  05/26/19 242 lb 4 oz (109.9 kg)  02/24/19 241 lb (109.3 kg)  12/10/18 250 lb (113.4 kg)    Physical Exam Vitals signs reviewed.  Constitutional:      Appearance: He is obese. He is not ill-appearing or diaphoretic.  HENT:     Nose: Nose normal.     Mouth/Throat:     Mouth: Mucous membranes are moist.     Pharynx: No oropharyngeal exudate or posterior oropharyngeal erythema.  Eyes:     General: No scleral icterus.       Right eye: Discharge present. No hordeolum.        Left eye: No discharge.     Conjunctiva/sclera:     Right eye: Right conjunctiva is injected. Exudate present.     Comments: Right corneal surface is  scarred. The palpebral and conjunctival surfaces are erythematous and there is a purulent exudate pooling in inside the lower eyelid  Neck:     Musculoskeletal: Normal range of motion and neck supple.  Cardiovascular:     Rate and Rhythm: Normal rate and regular rhythm.     Heart sounds: No murmur. No gallop.   Pulmonary:     Effort: Pulmonary effort is normal.     Breath sounds: No stridor. No wheezing or rales.  Abdominal:     General: Abdomen is  protuberant. Bowel sounds are normal.     Palpations: Abdomen is soft. There is no hepatomegaly or splenomegaly.     Tenderness: There is no abdominal tenderness.  Musculoskeletal: Normal range of motion.        General: No swelling.     Right lower leg: No edema.     Left lower leg: No edema.  Lymphadenopathy:     Cervical: No cervical adenopathy.  Skin:    General: Skin is warm and dry.  Neurological:     General: No focal deficit present.     Mental Status: He is alert.  Psychiatric:        Mood and Affect: Mood normal.        Behavior: Behavior normal.     Lab Results  Component Value Date   WBC 6.2 05/26/2019   HGB 10.4 (L) 05/26/2019   HCT 31.3 (L) 05/26/2019   PLT 218.0 05/26/2019   GLUCOSE 133 (H) 02/24/2019   CHOL 104 02/24/2019   TRIG 146.0 02/24/2019   HDL 25.50 (L) 02/24/2019   LDLDIRECT 82.7 04/04/2013   LDLCALC 49 02/24/2019   ALT 13 02/24/2019   AST 13 02/24/2019   NA 138 02/24/2019   K 4.3 02/24/2019   CL 103 02/24/2019   CREATININE 1.14 02/24/2019   BUN 16 02/24/2019   CO2 25 02/24/2019   TSH 1.15 02/24/2019   PSA 4.5 05/27/2019   INR 0.95 08/17/2010   HGBA1C 7.8 (H) 05/26/2019   MICROALBUR 4.2 (H) 02/24/2019    Vas [place] Lower Extremity Venous (dvt)  Result Date: 07/23/2018  Lower Venous DVT Study Indications: Edema, bilaterally. Denies SOB.  Risk Factors: None identified. Performing Technologist: Sharlett Iles RVT  Examination Guidelines: A complete evaluation includes B-mode imaging, spectral  Doppler, color Doppler, and power Doppler as needed of all accessible portions of each vessel. Bilateral testing is considered an integral part of a complete examination. Limited examinations for reoccurring indications may be performed as noted. The reflux portion of the exam is performed with the patient in reverse Trendelenburg.  Right Venous Findings: +---------+---------------+---------+-----------+----------+-------+          CompressibilityPhasicitySpontaneityPropertiesSummary +---------+---------------+---------+-----------+----------+-------+ CFV      Full           Yes      Yes                          +---------+---------------+---------+-----------+----------+-------+ SFJ      Full           Yes      Yes                          +---------+---------------+---------+-----------+----------+-------+ FV Prox  Full           Yes      Yes                          +---------+---------------+---------+-----------+----------+-------+ FV Mid   Full                                                 +---------+---------------+---------+-----------+----------+-------+ FV DistalFull           Yes      Yes                          +---------+---------------+---------+-----------+----------+-------+  PFV      Full           Yes      Yes                          +---------+---------------+---------+-----------+----------+-------+ POP      Full           Yes      Yes                          +---------+---------------+---------+-----------+----------+-------+ PTV      Full           Yes      No                           +---------+---------------+---------+-----------+----------+-------+ PERO     Full           Yes      No                           +---------+---------------+---------+-----------+----------+-------+ Gastroc  Full                                                 +---------+---------------+---------+-----------+----------+-------+ GSV       Full           Yes      Yes                          +---------+---------------+---------+-----------+----------+-------+  Left Venous Findings: +---------+---------------+---------+-----------+----------+-------+          CompressibilityPhasicitySpontaneityPropertiesSummary +---------+---------------+---------+-----------+----------+-------+ CFV      Full           Yes      Yes                          +---------+---------------+---------+-----------+----------+-------+ SFJ      Full           Yes      Yes                          +---------+---------------+---------+-----------+----------+-------+ FV Prox  Full           Yes      Yes                          +---------+---------------+---------+-----------+----------+-------+ FV Mid   Full                                                 +---------+---------------+---------+-----------+----------+-------+ FV DistalFull           Yes      Yes                          +---------+---------------+---------+-----------+----------+-------+ PFV      Full           Yes      Yes                          +---------+---------------+---------+-----------+----------+-------+  POP      Full           Yes      Yes                          +---------+---------------+---------+-----------+----------+-------+ PTV      Full           Yes      No                           +---------+---------------+---------+-----------+----------+-------+ PERO     Full           Yes      No                           +---------+---------------+---------+-----------+----------+-------+ Gastroc  Full                                                 +---------+---------------+---------+-----------+----------+-------+ GSV      Full           Yes      Yes                          +---------+---------------+---------+-----------+----------+-------+    Final Interpretation: Right: No evidence of deep vein thrombosis  in the lower extremity. No indirect evidence of obstruction proximal to the inguinal ligament. Left: No evidence of deep vein thrombosis in the lower extremity. No indirect evidence of obstruction proximal to the inguinal ligament.  *See table(s) above for measurements and observations. Electronically signed by Ida Rogue MD on 07/23/2018 at 7:37:05 PM.    Final     Assessment & Plan:   Pharell was seen today for hypertension, diabetes and anemia.  Diagnoses and all orders for this visit:  Type 2 diabetes mellitus with complication ([place])- His H6D is up to 7.8%.  His blood sugars are not adequately well controlled.  I have asked him to add an SGLT2 inhibitor to his current regimen. -     olmesartan-hydrochlorothiazide (BENICAR HCT) 40-12.5 MG tablet; Take 1 tablet by mouth daily. -     Hemoglobin A1c; Future -     dapagliflozin propanediol (FARXIGA) 10 MG TABS tablet; Take 10 mg by mouth daily.  Vitamin B12 deficiency anemia due to intrinsic factor deficiency- He remains mildly anemic but his B12 level is high.  I have asked him to refrain from receiving a B12 supplement.  There is also likely a component of anemia of chronic disease. -     CBC with Differential/Platelet; Future -     Vitamin B12; Future  Essential hypertension- His blood pressure is adequately well controlled. -     olmesartan-hydrochlorothiazide (BENICAR HCT) 40-12.5 MG tablet; Take 1 tablet by mouth daily.  Eye infection, right- The culture is positive for gram positive cocci in clusters.  Will restart the fluoroquinolone eyedrop. -     Wound culture; Future -     ciprofloxacin (CILOXAN) 0.3 % ophthalmic solution; PLACE 1 DROP INTO AFFECTED EYE EVERY 2 HOURS FOR 2 DAYS THEN 4 TIMES DAILYX 1 WEEK  Deficiency anemia- I will monitor him for vitamin deficiencies.  There is also likely a component of anemia of chronic disease. -  CBC with Differential/Platelet; Future -     Vitamin B12; Future -     Vitamin B1; Future -      IBC panel; Future -     Ferritin; Future  Chronic bacterial conjunctivitis of right eye -     ciprofloxacin (CILOXAN) 0.3 % ophthalmic solution; PLACE 1 DROP INTO AFFECTED EYE EVERY 2 HOURS FOR 2 DAYS THEN 4 TIMES DAILYX 1 WEEK  Chronic diastolic heart failure (H[facility]) -     olmesartan-hydrochlorothiazide (BENICAR HCT) 40-12.5 MG tablet; Take 1 tablet by mouth daily.  PSA elevation- His PSA is not rising which is reassuring that he does not have prostate cancer. -     PSA, total and free; Future  Chronic combined systolic and diastolic congestive heart failure ([place])- His volume status is normal.  I have asked him to start taking an SGLT2 inhibitor for cardiovascular risk reduction. -     dapagliflozin propanediol (FARXIGA) 10 MG TABS tablet; Take 10 mg by mouth daily.   I have discontinued Carloyn Manner B. Haste's Prodigy Lancets 28G and amoxicillin-clavulanate. I am also having him start on dapagliflozin propanediol. Additionally, I am having him maintain his tamsulosin, glucose blood, ezetimibe, fluticasone, ranolazine, FreeStyle Libre 14 Day Reader, levocetirizine, HYDROcodone-acetaminophen, insulin lispro protamine-lispro, aspirin, carvedilol, isosorbide mononitrate, pantoprazole, gabapentin, FreeStyle Libre Sensor System, cyanocobalamin, nitroGLYCERIN, traMADol, omega-3 acid ethyl esters, atorvastatin, Janumet, ketoconazole, insulin NPH-regular Human, Insulin Syringe-Needle U-100, losartan, olmesartan-hydrochlorothiazide, and ciprofloxacin.  Meds ordered this encounter  Medications  . olmesartan-hydrochlorothiazide (BENICAR HCT) 40-12.5 MG tablet    Sig: Take 1 tablet by mouth daily.    Dispense:  90 tablet    Refill:  1  . ciprofloxacin (CILOXAN) 0.3 % ophthalmic solution    Sig: PLACE 1 DROP INTO AFFECTED EYE EVERY 2 HOURS FOR 2 DAYS THEN 4 TIMES DAILYX 1 WEEK    Dispense:  10 mL    Refill:  0  . dapagliflozin propanediol (FARXIGA) 10 MG TABS tablet    Sig: Take 10 mg by mouth daily.     Dispense:  90 tablet    Refill:  1     Follow-up: Return in about 6 months (around 11/25/2019).  [name], MD

## 2019-05-27 ENCOUNTER — Encounter: Payer: Self-pay | Admitting: Internal Medicine

## 2019-05-27 LAB — PSA: PSA: 4.5

## 2019-05-27 MED ORDER — DAPAGLIFLOZIN PROPANEDIOL 10 MG PO TABS: 10.0000 mg | ORAL_TABLET | Freq: Every day | ORAL | 1 refills | Status: DC

## 2019-05-29 ENCOUNTER — Encounter: Payer: Self-pay | Admitting: Internal Medicine

## 2019-05-29 LAB — WOUND CULTURE
MICRO NUMBER:: 537477
SPECIMEN QUALITY:: ADEQUATE

## 2019-05-30 NOTE — Telephone Encounter (Signed)
Patient Name/DOB/MRN #: [name]   12/19/1951   [id number]  Requestor Name/Agency: [employer] [place]  Fax: [phone] Call Back #: [phone] Information Requested: Requesting fax of right eye culture/lab, before pt has appt Wednesday morning

## 2019-06-01 DIAGNOSIS — H02011 Cicatricial entropion of right upper eyelid: Secondary | ICD-10-CM | POA: Diagnosis not present

## 2019-06-01 DIAGNOSIS — H02051 Trichiasis without entropian right upper eyelid: Secondary | ICD-10-CM | POA: Diagnosis not present

## 2019-06-01 LAB — PSA, TOTAL AND FREE
PSA, % Free: 33 % (calc) (ref >25)
PSA, Free: 1.5 ng/mL
PSA, Total: 4.5 ng/mL — ABNORMAL HIGH (ref <=4.0)

## 2019-06-01 LAB — VITAMIN B1: Vitamin B1 (Thiamine): 14 nmol/L (ref 8–30)

## 2019-06-04 ENCOUNTER — Other Ambulatory Visit: Payer: Self-pay | Admitting: Internal Medicine

## 2019-06-04 DIAGNOSIS — I2510 Atherosclerotic heart disease of native coronary artery without angina pectoris: Secondary | ICD-10-CM

## 2019-06-09 ENCOUNTER — Other Ambulatory Visit: Payer: Self-pay

## 2019-06-09 NOTE — Patient Outreach (Signed)
[facility]) Care Management  06/09/2019  [name] Sep 19, 1951 [id number]  TELEPHONE SCREENING Referral date: 05/30/19 Referral source: primary MD  Referral reason: diabetes Insurance: [employer]  Telephone call to patient regarding.primary MD referral. HIPAA verified with patient.  Patient gave verbal authorization to speak with his wife, [facility] regarding his health information. Wife also listed as patients designated party release.   Patient/ wife states patient has had diabetes for approximately 21 years.  Wife states patient is on insulin and oral medication for management. Wife states she is unsure what patients last A1 c is.  Wife states patients fasting blood sugar this am was 101.  She states patient checks his blood sugars daily. She states patients blood sugars range from 70's to 130's.  She states he has not had very low blood sugars within the past 6-8 months. Wife states patients primary MD manages his diabetes.  Wife reports patient is totally blind. She states patient is currently using the 14 arm sensor for monitoring blood sugars. She states patient needs a voice controlled monitor. Wife states patient has neuropathy in his hands and feet. She states patient also drags his right foot. Wife states patient is unable to stand for long periods of time. She states patient has a wheelchair, site can for ambulation. Wife states patient is at risk for falls. She denies patient having any falls within the past 3 months. Wife states she takes the necessary precautions in the home for fall safety.  Wife states patient manages his own medications. She states manages his medications very well. Wife states patient has XTRA help for his medications. Patient states he deals with depression symptoms sometimes. He denies being on any medication for depression. He states he has days he lacks interest in things due to what he is dealing with regarding his health.  Patient states he is also having problems with his memory.  Patient states he has mentioned this to his primary doctor but feels like he is not being heard.  Wife states they have considered changing primary care providers. RNCM informed patient to contact his [employer] concierge for in plan provider if needed.   Wife states patient sees Dr. Lacie Draft, cardiologist every 6 months to a year.   RNCM informed patient and wife that patient will receive letter and call from [facility] for case management follow up. Patient verbally agreed to follow up with [facility] .    PLAN: RNCM will close case due to patient being followed by an external case management program.  RNCM will send patients primary MD of closure.   [name] RN,BSN,CCM [facility] Telephonic  [phone]

## 2019-06-30 ENCOUNTER — Telehealth: Payer: Self-pay | Admitting: *Deleted

## 2019-06-30 NOTE — Telephone Encounter (Signed)
LVM for patient to call back in regards to scheduling AWV with our health coach. Discussed doing a virtual/ audio visit and asked patient to call nurse back at [phone] to schedule.

## 2019-07-12 ENCOUNTER — Encounter: Payer: Self-pay | Admitting: Internal Medicine

## 2019-07-13 ENCOUNTER — Ambulatory Visit: Payer: PPO | Admitting: Registered"

## 2019-07-13 DIAGNOSIS — E118 Type 2 diabetes mellitus with unspecified complications: Secondary | ICD-10-CM

## 2019-07-13 NOTE — Progress Notes (Signed)
Telephone visit: Patient identified with name and DOB.  Diabetes Self-Management Education  Visit Type: First/Initial  Appt. Start Time: 1400 Appt. End Time: 3299  07/13/2019  [name], identified by name and date of birth, is a 68 y.o. male with a diagnosis of Diabetes: Type 2.   ASSESSMENT  There were no vitals taken for this visit. There is no height or weight on file to calculate BMI.   Patient is blind, has some depression related to condition. Pt states his wife cooks the meals and tries to make things that are good for his DM. She also helps him with meds and reading educational materials.  DM related meds: Kandis Ban, Janumet  Patient states he has been checking PPBG 30 min - 1 hr after some meals/ one time was 160, usually 130, states he is eating less, just eats one serving and not getting seconds and has started drinking more water.  Pt reports he likes corn on the cob, turnip greens, lima beans, mashed potatoes, pork chops, baked chicken  Started checking BG before bed so he knows if he should eat something to prevent low blood sugar. Was 110 last night. If sugar gets down to 60 will eat or drink something before going to sleep. It has been awhile since he has had hypoglycemia episode. The one time that was alarming for him was during in his sleep got down to about 40 mg/dL and wife took him to the [name].  Pt reports he doesn't get much exercise d/t terrible back pain which makes just walking through the house difficult. Tried arm chair exercises didn't like it. Brother gave him bicycle will try that again. Reports 1-7 hours daily watches TV.  Last week In home health care checked A1c 5.4%; checked heart, feet, pricked finger.  Diabetes Self-Management Education - 07/13/19 1413      Visit Information   Visit Type  First/Initial      Initial Visit   Diabetes Type  Type 2    Are you taking your medications as prescribed?  Yes    Date Diagnosed  [place]   How would you rate your overall health?  Fair      Psychosocial Assessment   Patient Belief/Attitude about Diabetes  Motivated to manage diabetes    Self-care barriers  Impaired vision    How often do you need to have someone help you when you read instructions, pamphlets, or other written materials from your doctor or pharmacy?  5 - Always   blind   What is the last grade level you completed in school?  12      Complications   How often do you check your blood sugar?  1-2 times/day    Fasting Blood glucose range (mg/dL)  70-129   80-119   Number of hypoglycemic episodes per month  0    Have you had a dilated eye exam in the past 12 months?  --   blind   Have you had a dental exam in the past 12 months?  No    Are you checking your feet?  Yes    How many days per week are you checking your feet?  --   wife checks, goes to podiatrist every 3 mo for pedicure & checks     Dietary Intake   Breakfast  grits and eggs, maybe bacon, tea or coffee, water    Snack (morning)  fruit OR yogurt & strawberry &  breakfast bar OR PB crackers    Lunch  [place] sandwich, lettuce, tomato OR salad    Dinner  corn on the cob, turnip greens, lima beans, mashed potatoes, meat (pork chops, baked chicken)    Snack (evening)  not really, if sugar is low    Beverage(s)  water, coffee, tea      Exercise   Exercise Type  ADL's    How many days per week to you exercise?  0    How many minutes per day do you exercise?  0    Total minutes per week of exercise  0      Individualized Goals (developed by patient)   Nutrition  General guidelines for healthy choices and portions discussed    Physical Activity  --   find something enjoyable movement   Monitoring   test my blood glucose as discussed    Reducing Risk  treat hypoglycemia with 15 grams of carbs if blood glucose less than 70mg /dL      Outcomes   Expected Outcomes  Demonstrated interest in learning. Expect positive outcomes     Future DMSE  PRN    Program Status  Completed       Individualized Plan for Diabetes Self-Management Training:   Learning Objective:  Patient will have a greater understanding of diabetes self-management. Patient education plan is to attend individual and/or group sessions per assessed needs and concerns.    Patient Instructions  Consider eating protein with fruit. You can check your blood sugar 2 hrs after meals to give you a better idea of how a meal affects your blood sugar.  Continue to check blood sugar before bed and eat a snack if 80 or below. If below 70 treat hypoglycemia with 1/2 c juice or other fast acting carbohydrate and follow up 15 min later when blood sugar is back up have some with protein to help stabilize your blood sugar.  Consider finding some type of movement that you enjoy  Get the bike out again that your brother gave you.  Consider contacting your doctor about your last A1c from [place] of 5.4% just in case he wants to see you before December.     Expected Outcomes:  Demonstrated interest in learning. Expect positive outcomes  Education material provided: Therapists/counseling resources, snack sheet  If problems or questions, patient to contact team via:  Phone  Future DSME appointment: PRN

## 2019-07-13 NOTE — Patient Instructions (Addendum)
Consider eating protein with fruit. You can check your blood sugar 2 hrs after meals to give you a better idea of how a meal affects your blood sugar.  Continue to check blood sugar before bed and eat a snack if 80 or below. If below 70 treat hypoglycemia with 1/2 c juice or other fast acting carbohydrate and follow up 15 min later when blood sugar is back up have some with protein to help stabilize your blood sugar.  Consider finding some type of movement that you enjoy  Get the bike out again that your brother gave you.  Consider contacting your doctor about your last A1c from [place] of 5.4% just in case he wants to see you before December.

## 2019-07-17 ENCOUNTER — Other Ambulatory Visit: Payer: Self-pay | Admitting: Internal Medicine

## 2019-07-17 DIAGNOSIS — J301 Allergic rhinitis due to pollen: Secondary | ICD-10-CM

## 2019-07-18 ENCOUNTER — Encounter: Payer: Self-pay | Admitting: General Surgery

## 2019-07-19 ENCOUNTER — Encounter: Payer: Self-pay | Admitting: Gastroenterology

## 2019-07-19 ENCOUNTER — Ambulatory Visit (INDEPENDENT_AMBULATORY_CARE_PROVIDER_SITE_OTHER): Payer: PPO | Admitting: Gastroenterology

## 2019-07-19 VITALS — Ht 70.0 in | Wt 240.0 lb

## 2019-07-19 DIAGNOSIS — D126 Benign neoplasm of colon, unspecified: Secondary | ICD-10-CM

## 2019-07-19 MED ORDER — PEG 3350-KCL-NA BICARB-NACL 420 G PO SOLR: 4000.0000 mL | ORAL | 0 refills | Status: DC

## 2019-07-19 NOTE — Progress Notes (Signed)
Review of pertinent gastrointestinal problems: 1.  Elevated risk for colon cancer, his mother had colon cancer (diagnosed in her 9s) and he has had adenomatous polyps.  Colonoscopy November 2011 Dr. Ardis Hughs found two subcentimeter polyps.  Left-sided diverticulosis.  The polyps were both adenomas he was recommended to have repeat colonoscopy at 5-year interval given his family history of colon cancer and personal history of adenomatous polyps.    This service was provided via virtual visit.   Only audio was used.  The patient was located at home.  I was located in my office.  The patient did consent to this virtual visit and is aware of possible charges through their insurance for this visit.  I have seen him before but not in many many years and so he is considered to be a new patient my certified medical assistant, Grace Bushy, contributed to this visit by contacting the patient by phone 1 or 2 business days prior to the appointment and also followed up on the recommendations I made after the visit.  Time spent on virtual visit: 23 min   HPI: This is a very pleasant 68 year old man whom I last saw little over 9 years ago at the time of a colonoscopy.  I found two subcentimeter polyps which on pathology were adenomatous.  I recommended repeat colonoscopy at 5 years.  I have not heard from him since.  Blood work June 2020 shows hemoglobin 10.4 with an MCV of 72.  Total iron normal, ferritin normal.  Transferrin slightly low at 189.  No GI issues.  Specifically no significant constipation, diarrhea or abdominal pains.  He is blind and so he is not sure if he has had blood in his stool  Chief complaint is personal history of adenomatous polyps, family history of colon cancer  ROS: complete GI ROS as described in HPI, all other review negative.  Constitutional:  No unintentional weight loss   Past Medical History:  Diagnosis Date  . Arthritis    "mild arthritis in hip"  . CAD (coronary  artery disease)   . CHF (congestive heart failure) ([place])   . Complication of anesthesia    "lungs filled up with fluid"   . Diabetes mellitus    Type II  . Glaucoma   . Heart murmur   . HTN (hypertension)   . Hypercholesterolemia   . LBP (low back pain)   . Legally blind   . Peripheral neuropathy   . Sleep apnea   . Staph infection    "from back surgery"  . Trigger finger   . Ulcer   . Wears glasses    "to protect cornea" - legally blind    Past Surgical History:  Procedure Laterality Date  . CHOLECYSTECTOMY  09/15/2012   Procedure: LAPAROSCOPIC CHOLECYSTECTOMY WITH INTRAOPERATIVE CHOLANGIOGRAM;  Surgeon: Pedro Earls, MD;  Location: [place];  Service: General;  Laterality: N/A;  . EYE SURGERY Bilateral    cataracts  . INGUINAL HERNIA REPAIR     right  . LUMBAR FUSION    . LUMBAR LAMINECTOMY/DECOMPRESSION MICRODISCECTOMY N/A 10/26/2015   Procedure: RIGHT AND CENTRAL LUMBAR LAMINECTOMY L3-4, RIGHT L5-S1 LATERAL RECESS DECOMPRESSION;  Surgeon: Jessy Oto, MD;  Location: [employer];  Service: Orthopedics;  Laterality: N/A;  . NECK SURGERY    . peptic ulcer dz surgery  pt was in his 20s   bleeding ulcer.   . Prosthetic Cornea placement, Right eye  2007   [place]  Right 10/26/2015   Procedure: RELEASE TRIGGER FINGER RIGHT THUMB;  Surgeon: Jessy Oto, MD;  Location: [place];  Service: Orthopedics;  Laterality: Right;    Current Outpatient Medications  Medication Sig Dispense Refill  . aspirin 81 MG EC tablet Take 1 tablet (81 mg total) by mouth daily. Swallow whole. 90 tablet 1  . atorvastatin (LIPITOR) 80 MG tablet TAKE 1 TABLET (80 MG TOTAL) BY MOUTH DAILY AT 6 PM. 90 tablet 1  . carvedilol (COREG) 12.5 MG tablet TAKE 1 TABLET BY MOUTH TWICE A DAY WITH A MEAL 180 tablet 3  . ciprofloxacin (CILOXAN) 0.3 % ophthalmic solution PLACE 1 DROP INTO AFFECTED EYE EVERY 2 HOURS FOR 2 DAYS THEN 4 TIMES DAILYX 1 WEEK 10 mL 0  . Continuous Blood Gluc  Receiver (FREESTYLE LIBRE 14 DAY READER) DEVI USE AS DIRECTED FOR READINGS 1 Device 11  . Continuous Blood Gluc Sensor (FREESTYLE LIBRE SENSOR SYSTEM) MISC Use 1 sensor every 14 days, then replace with new one. 6 each 3  . cyanocobalamin 2000 MCG tablet Take 1 tablet (2,000 mcg total) by mouth daily. 90 tablet 1  . dapagliflozin propanediol (FARXIGA) 10 MG TABS tablet Take 10 mg by mouth daily. 90 tablet 1  . ezetimibe (ZETIA) 10 MG tablet Take 1 tablet (10 mg total) by mouth daily. 90 tablet 3  . fluticasone (FLONASE) 50 MCG/ACT nasal spray SPRAY 2 SPRAYS INTO EACH NOSTRIL EVERY DAY 48 mL 3  . gabapentin (NEURONTIN) 300 MG capsule TAKE 1 CAPSULE BY MOUTH 3 TIMES A DAY 270 capsule 2  . glucose blood (ONETOUCH VERIO) test strip Use BID to check BS. DX E11.9 100 each 12  . insulin lispro protamine-lispro (HUMALOG 75/25 MIX) (75-25) 100 UNIT/ML SUSP injection     . insulin NPH-regular Human (NOVOLIN 70/30) (70-30) 100 UNIT/ML injection INJECT 85 UINTS SUBCUTANEOUSLY IN THE AM AND 45 UNITS IN THE PM 110 mL 3  . Insulin Syringe-Needle U-100 (B-D INS SYR ULTRAFINE 1CC/30G) 30G X 1/2" 1 ML MISC Use to inject insulin twice daily. 200 each 3  . isosorbide mononitrate (IMDUR) 120 MG 24 hr tablet Take 1 tablet (120 mg total) by mouth daily. 90 tablet 3  . JANUMET 50-1000 MG tablet TAKE 1 TABLET BY MOUTH TWICE A DAY WITH A MEAL 180 tablet 1  . ketoconazole (NIZORAL) 2 % cream APPLY TO BOTH FEET AND BETWEEN TOES ONCE DAILY FOR 6 WEEKS. 30 g 1  . levocetirizine (XYZAL) 5 MG tablet Take 1 tablet (5 mg total) by mouth every evening. 90 tablet 3  . olmesartan-hydrochlorothiazide (BENICAR HCT) 20-12.5 MG tablet Take 1 tablet by mouth daily.    Marland Kitchen omega-3 acid ethyl esters (LOVAZA) 1 g capsule TAKE 2 CAPSULES (2 G TOTAL) BY MOUTH 2 (TWO) TIMES DAILY. 360 capsule 1  . pantoprazole (PROTONIX) 40 MG tablet TAKE 1 TABLET BY MOUTH TWICE A DAY 180 tablet 3  . ranolazine (RANEXA) 500 MG 12 hr tablet Take 1 tablet (500 mg  total) by mouth 2 (two) times daily. 180 tablet 3  . tamsulosin (FLOMAX) 0.4 MG CAPS capsule Take 1 capsule (0.4 mg total) by mouth daily. 15 capsule 1  . traMADol (ULTRAM) 50 MG tablet Take 1 tablet (50 mg total) by mouth every 8 (eight) hours as needed. 270 tablet 1  . nitroGLYCERIN (NITROSTAT) 0.4 MG SL tablet Place 1 tablet (0.4 mg total) under the tongue every 5 (five) minutes as needed for chest pain. 25 tablet 3   No current  facility-administered medications for this visit.     Allergies as of 07/19/2019 - Review Complete 07/19/2019  Allergen Reaction Noted  . Furosemide  10/01/2012  . Amlodipine Swelling 11/24/2017  . Diamox [acetazolamide] Itching 09/11/2012  . Lisinopril Rash 12/18/2010    Family History  Problem Relation Age of Onset  . Breast cancer Mother   . Colon cancer Mother   . Hypertension Mother   . Diabetes Mother   . Hypertension Father   . Colon cancer Other        Elevated Risk for  . Diabetes Sister   . Diabetes Brother     Social History   Socioeconomic History  . Marital status: Married    Spouse name: [name]  . Number of children: 3  . Years of education: 44  . Highest education level: Not on file  Occupational History  . Occupation: disabled    Comment: blind  Social Needs  . Financial resource strain: Not very hard  . Food insecurity    Worry: Sometimes true    Inability: Sometimes true  . Transportation needs    Medical: No    Non-medical: No  Tobacco Use  . Smoking status: Never Smoker  . Smokeless tobacco: Never Used  Substance and Sexual Activity  . Alcohol use: No  . Drug use: No  . Sexual activity: Not Currently  Lifestyle  . Physical activity    Days per week: 0 days    Minutes per session: 0 min  . Stress: Not at all  Relationships  . Social connections    Talks on phone: More than three times a week    Gets together: More than three times a week    Attends religious service: More than 4 times per year    Active  member of club or organization: Yes    Attends meetings of clubs or organizations: More than 4 times per year    Relationship status: Married  . Intimate partner violence    Fear of current or ex partner: No    Emotionally abused: No    Physically abused: No    Forced sexual activity: No  Other Topics Concern  . Not on file  Social History Narrative   Occupation: disabled, blind   Married   Regular Exercise-no   Lives at home with his wife.   Right-handed.   2-3 cups caffeine per day.     Physical Exam: Unable to perform because this was a "telemed visit" due to current Covid-19 pandemic  Assessment and plan: 68 y.o. male with personal history of adenomatous polyps, family history of colon cancer  I recommended he have a repeat colonoscopy at his soonest convenience.  I see no reason for any further blood tests or imaging studies prior to then.  He has no GI symptoms fortunately and so I think it is probably unlikely there will be anything serious in his colon.  Please see the "Patient Instructions" section for addition details about the plan.  Owens Loffler, MD [facility] 07/19/2019, 9:37 AM

## 2019-07-19 NOTE — Patient Instructions (Addendum)
We will arrange a colonoscopy for personal history of adenomatous polyps and family history of colon cancer to be done at his soonest convenience.  Thank you for entrusting me with your care and choosing [facility].  Dr Ardis Hughs

## 2019-08-01 DIAGNOSIS — H1031 Unspecified acute conjunctivitis, right eye: Secondary | ICD-10-CM | POA: Diagnosis not present

## 2019-08-02 LAB — HM DIABETES EYE EXAM

## 2019-08-03 ENCOUNTER — Ambulatory Visit (INDEPENDENT_AMBULATORY_CARE_PROVIDER_SITE_OTHER): Payer: PPO | Admitting: Internal Medicine

## 2019-08-03 ENCOUNTER — Other Ambulatory Visit: Payer: Self-pay

## 2019-08-03 ENCOUNTER — Encounter: Payer: Self-pay | Admitting: Internal Medicine

## 2019-08-03 ENCOUNTER — Other Ambulatory Visit (INDEPENDENT_AMBULATORY_CARE_PROVIDER_SITE_OTHER): Payer: PPO

## 2019-08-03 VITALS — BP 108/62 | HR 86 | Temp 98.8°F | Resp 16 | Ht 70.0 in | Wt 239.0 lb

## 2019-08-03 DIAGNOSIS — D510 Vitamin B12 deficiency anemia due to intrinsic factor deficiency: Secondary | ICD-10-CM

## 2019-08-03 DIAGNOSIS — I10 Essential (primary) hypertension: Secondary | ICD-10-CM | POA: Diagnosis not present

## 2019-08-03 DIAGNOSIS — D539 Nutritional anemia, unspecified: Secondary | ICD-10-CM

## 2019-08-03 DIAGNOSIS — H9313 Tinnitus, bilateral: Secondary | ICD-10-CM | POA: Diagnosis not present

## 2019-08-03 DIAGNOSIS — N183 Chronic kidney disease, stage 3 unspecified: Secondary | ICD-10-CM

## 2019-08-03 DIAGNOSIS — E118 Type 2 diabetes mellitus with unspecified complications: Secondary | ICD-10-CM

## 2019-08-03 DIAGNOSIS — D638 Anemia in other chronic diseases classified elsewhere: Secondary | ICD-10-CM | POA: Diagnosis not present

## 2019-08-03 LAB — BASIC METABOLIC PANEL
BUN: 22 mg/dL (ref 6–23)
CO2: 26 mEq/L (ref 19–32)
Calcium: 8.5 mg/dL (ref 8.4–10.5)
Chloride: 106 mEq/L (ref 96–112)
Creatinine, Ser: 1.52 mg/dL — ABNORMAL HIGH (ref 0.40–1.50)
GFR: 55.14 mL/min — ABNORMAL LOW (ref >60.00)
Glucose, Bld: 103 mg/dL — ABNORMAL HIGH (ref 70–99)
Potassium: 5 mEq/L (ref 3.5–5.1)
Sodium: 139 mEq/L (ref 135–145)

## 2019-08-03 LAB — CBC WITH DIFFERENTIAL/PLATELET
Basophils Absolute: 0 10*3/uL (ref 0.0–0.1)
Basophils Relative: 0.7 % (ref 0.0–3.0)
Eosinophils Absolute: 0.3 10*3/uL (ref 0.0–0.7)
Eosinophils Relative: 4.5 % (ref 0.0–5.0)
HCT: 30.8 % — ABNORMAL LOW (ref 39.0–52.0)
Hemoglobin: 9.9 g/dL — ABNORMAL LOW (ref 13.0–17.0)
Lymphocytes Relative: 34.8 % (ref 12.0–46.0)
Lymphs Abs: 2 10*3/uL (ref 0.7–4.0)
MCHC: 32.1 g/dL (ref 30.0–36.0)
MCV: 74.1 fl — ABNORMAL LOW (ref 78.0–100.0)
Monocytes Absolute: 0.5 10*3/uL (ref 0.1–1.0)
Monocytes Relative: 8.2 % (ref 3.0–12.0)
Neutro Abs: 3 10*3/uL (ref 1.4–7.7)
Neutrophils Relative %: 51.8 % (ref 43.0–77.0)
Platelets: 211 10*3/uL (ref 150.0–400.0)
RBC: 4.15 Mil/uL — ABNORMAL LOW (ref 4.22–5.81)
RDW: 15.1 % (ref 11.5–15.5)
WBC: 5.7 10*3/uL (ref 4.0–10.5)

## 2019-08-03 LAB — HEMOGLOBIN A1C: Hgb A1c MFr Bld: 6.6 % — ABNORMAL HIGH (ref 4.6–6.5)

## 2019-08-03 MED ORDER — ACCU-CHEK GUIDE ME W/DEVICE KIT: 1.0000 | PACK | Freq: Three times a day (TID) | 0 refills | Status: DC

## 2019-08-03 MED ORDER — OLMESARTAN MEDOXOMIL 20 MG PO TABS: 20.0000 mg | ORAL_TABLET | Freq: Every day | ORAL | 1 refills | Status: DC

## 2019-08-03 NOTE — Patient Instructions (Signed)

## 2019-08-03 NOTE — Progress Notes (Signed)
Subjective:  Patient ID: [name], male    DOB: [birth year]  Age: 68 y.o. MRN: [id number]  CC: Hypertension and Diabetes   HPI [name] presents for f/up - He complains of a several week history history of dizziness and lightheadedness.  He has also had worsening tinnitus for about 2 months, worse on the right than the left.  He denies loss of hearing, headache, paresthesias, nausea, or vomiting.  He saw his eye doctor a day or 2 prior to this visit and he is concerned that the eye infection may be related to the tinnitus.  It is been recommended that he see an ENT doctor.  Outpatient Medications Prior to Visit  Medication Sig Dispense Refill  . aspirin 81 MG EC tablet Take 1 tablet (81 mg total) by mouth daily. Swallow whole. 90 tablet 1  . atorvastatin (LIPITOR) 80 MG tablet TAKE 1 TABLET (80 MG TOTAL) BY MOUTH DAILY AT 6 PM. 90 tablet 1  . carvedilol (COREG) 12.5 MG tablet TAKE 1 TABLET BY MOUTH TWICE A DAY WITH A MEAL 180 tablet 3  . ciprofloxacin (CILOXAN) 0.3 % ophthalmic solution PLACE 1 DROP INTO AFFECTED EYE EVERY 2 HOURS FOR 2 DAYS THEN 4 TIMES DAILYX 1 WEEK 10 mL 0  . Continuous Blood Gluc Receiver (FREESTYLE LIBRE 14 DAY READER) DEVI USE AS DIRECTED FOR READINGS 1 Device 11  . Continuous Blood Gluc Sensor (FREESTYLE LIBRE SENSOR SYSTEM) MISC Use 1 sensor every 14 days, then replace with new one. 6 each 3  . cyanocobalamin 2000 MCG tablet Take 1 tablet (2,000 mcg total) by mouth daily. 90 tablet 1  . dapagliflozin propanediol (FARXIGA) 10 MG TABS tablet Take 10 mg by mouth daily. 90 tablet 1  . ezetimibe (ZETIA) 10 MG tablet Take 1 tablet (10 mg total) by mouth daily. 90 tablet 3  . fluticasone (FLONASE) 50 MCG/ACT nasal spray SPRAY 2 SPRAYS INTO EACH NOSTRIL EVERY DAY 48 mL 3  . gabapentin (NEURONTIN) 300 MG capsule TAKE 1 CAPSULE BY MOUTH 3 TIMES A DAY 270 capsule 2  . glucose blood (ONETOUCH VERIO) test strip Use BID to check BS. DX E11.9 100 each 12  . insulin lispro  protamine-lispro (HUMALOG 75/25 MIX) (75-25) 100 UNIT/ML SUSP injection     . insulin NPH-regular Human (NOVOLIN 70/30) (70-30) 100 UNIT/ML injection INJECT 85 UINTS SUBCUTANEOUSLY IN THE AM AND 45 UNITS IN THE PM 110 mL 3  . Insulin Syringe-Needle U-100 (B-D INS SYR ULTRAFINE 1CC/30G) 30G X 1/2" 1 ML MISC Use to inject insulin twice daily. 200 each 3  . isosorbide mononitrate (IMDUR) 120 MG 24 hr tablet Take 1 tablet (120 mg total) by mouth daily. 90 tablet 3  . JANUMET 50-1000 MG tablet TAKE 1 TABLET BY MOUTH TWICE A DAY WITH A MEAL 180 tablet 1  . ketoconazole (NIZORAL) 2 % cream APPLY TO BOTH FEET AND BETWEEN TOES ONCE DAILY FOR 6 WEEKS. 30 g 1  . levocetirizine (XYZAL) 5 MG tablet Take 1 tablet (5 mg total) by mouth every evening. 90 tablet 3  . omega-3 acid ethyl esters (LOVAZA) 1 g capsule TAKE 2 CAPSULES (2 G TOTAL) BY MOUTH 2 (TWO) TIMES DAILY. 360 capsule 1  . pantoprazole (PROTONIX) 40 MG tablet TAKE 1 TABLET BY MOUTH TWICE A DAY 180 tablet 3  . polyethylene glycol-electrolytes (NULYTELY/GOLYTELY) 420 g solution Take 4,000 mLs by mouth as directed. 4000 mL 0  . ranolazine (RANEXA) 500 MG 12 hr tablet Take 1  tablet (500 mg total) by mouth 2 (two) times daily. 180 tablet 3  . tamsulosin (FLOMAX) 0.4 MG CAPS capsule Take 1 capsule (0.4 mg total) by mouth daily. 15 capsule 1  . traMADol (ULTRAM) 50 MG tablet Take 1 tablet (50 mg total) by mouth every 8 (eight) hours as needed. 270 tablet 1  . olmesartan-hydrochlorothiazide (BENICAR HCT) 20-12.5 MG tablet Take 1 tablet by mouth daily.    Marland Kitchen amoxicillin-clavulanate (AUGMENTIN) 500-125 MG tablet Take 1 tablet by mouth 2 (two) times daily.    . nitroGLYCERIN (NITROSTAT) 0.4 MG SL tablet Place 1 tablet (0.4 mg total) under the tongue every 5 (five) minutes as needed for chest pain. 25 tablet 3   No facility-administered medications prior to visit.     ROS Review of Systems  Constitutional: Negative.  Negative for chills, diaphoresis, fatigue  and fever.  HENT: Positive for tinnitus. Negative for ear pain and hearing loss.   Respiratory: Negative for cough, chest tightness, shortness of breath and wheezing.   Cardiovascular: Negative for chest pain, palpitations and leg swelling.  Gastrointestinal: Negative for abdominal pain, constipation, diarrhea, nausea and vomiting.  Endocrine: Negative.  Negative for polydipsia, polyphagia and polyuria.  Genitourinary: Negative.  Negative for difficulty urinating.  Musculoskeletal: Negative.  Negative for arthralgias.  Skin: Negative.  Negative for color change.  Neurological: Positive for dizziness and light-headedness. Negative for weakness and headaches.  Hematological: Negative for adenopathy. Does not bruise/bleed easily.  Psychiatric/Behavioral: Negative.     Objective:  BP 108/62 (BP Location: Left Arm, Patient Position: Sitting, Cuff Size: Large)   Pulse 86   Temp 98.8 F (37.1 C) (Oral)   Resp 16   Ht 5' 10"  (1.778 m)   Wt 239 lb (108.4 kg)   SpO2 95%   BMI 34.29 kg/m   BP Readings from Last 3 Encounters:  08/03/19 108/62  05/26/19 124/68  02/24/19 (!) 170/80    Wt Readings from Last 3 Encounters:  08/03/19 239 lb (108.4 kg)  07/19/19 240 lb (108.9 kg)  07/18/19 242 lb (109.8 kg)    Physical Exam Vitals signs reviewed.  Constitutional:      Appearance: He is obese. He is not ill-appearing or diaphoretic.  HENT:     Right Ear: Hearing normal. There is impacted cerumen.     Left Ear: Hearing normal. There is impacted cerumen.  Eyes:     General: No scleral icterus. Neck:     Musculoskeletal: Normal range of motion.  Cardiovascular:     Rate and Rhythm: Normal rate and regular rhythm.     Heart sounds: No murmur.  Pulmonary:     Effort: Pulmonary effort is normal. No respiratory distress.     Breath sounds: No stridor. No wheezing or rales.  Abdominal:     General: Abdomen is protuberant. Bowel sounds are normal. There is no distension.     Palpations:  Abdomen is soft. There is no hepatomegaly, splenomegaly or mass.     Tenderness: There is no abdominal tenderness.  Musculoskeletal: Normal range of motion.     Right lower leg: Edema (trace) present.     Left lower leg: Edema (trace) present.  Skin:    General: Skin is warm.     Coloration: Skin is not jaundiced or pale.     Findings: No erythema.  Neurological:     General: No focal deficit present.     Mental Status: He is oriented to person, place, and time. Mental status is at baseline.  Psychiatric:        Mood and Affect: Mood normal.        Behavior: Behavior normal.     Lab Results  Component Value Date   WBC 5.7 08/03/2019   HGB 9.9 (L) 08/03/2019   HCT 30.8 (L) 08/03/2019   PLT 211.0 08/03/2019   GLUCOSE 103 (H) 08/03/2019   CHOL 104 02/24/2019   TRIG 146.0 02/24/2019   HDL 25.50 (L) 02/24/2019   LDLDIRECT 82.7 04/04/2013   LDLCALC 49 02/24/2019   ALT 13 02/24/2019   AST 13 02/24/2019   NA 139 08/03/2019   K 5.0 08/03/2019   CL 106 08/03/2019   CREATININE 1.52 (H) 08/03/2019   BUN 22 08/03/2019   CO2 26 08/03/2019   TSH 1.15 02/24/2019   PSA 4.5 05/27/2019   INR 0.95 08/17/2010   HGBA1C 6.6 (H) 08/03/2019   MICROALBUR 4.2 (H) 02/24/2019    Vas [place] Lower Extremity Venous (dvt)  Result Date: 07/23/2018  Lower Venous DVT Study Indications: Edema, bilaterally. Denies SOB.  Risk Factors: None identified. Performing Technologist: Sharlett Iles RVT  Examination Guidelines: A complete evaluation includes B-mode imaging, spectral Doppler, color Doppler, and power Doppler as needed of all accessible portions of each vessel. Bilateral testing is considered an integral part of a complete examination. Limited examinations for reoccurring indications may be performed as noted. The reflux portion of the exam is performed with the patient in reverse Trendelenburg.  Right Venous Findings: +---------+---------------+---------+-----------+----------+-------+           CompressibilityPhasicitySpontaneityPropertiesSummary +---------+---------------+---------+-----------+----------+-------+ CFV      Full           Yes      Yes                          +---------+---------------+---------+-----------+----------+-------+ SFJ      Full           Yes      Yes                          +---------+---------------+---------+-----------+----------+-------+ FV Prox  Full           Yes      Yes                          +---------+---------------+---------+-----------+----------+-------+ FV Mid   Full                                                 +---------+---------------+---------+-----------+----------+-------+ FV DistalFull           Yes      Yes                          +---------+---------------+---------+-----------+----------+-------+ PFV      Full           Yes      Yes                          +---------+---------------+---------+-----------+----------+-------+ POP      Full           Yes      Yes                          +---------+---------------+---------+-----------+----------+-------+  PTV      Full           Yes      No                           +---------+---------------+---------+-----------+----------+-------+ PERO     Full           Yes      No                           +---------+---------------+---------+-----------+----------+-------+ Gastroc  Full                                                 +---------+---------------+---------+-----------+----------+-------+ GSV      Full           Yes      Yes                          +---------+---------------+---------+-----------+----------+-------+  Left Venous Findings: +---------+---------------+---------+-----------+----------+-------+          CompressibilityPhasicitySpontaneityPropertiesSummary +---------+---------------+---------+-----------+----------+-------+ CFV      Full           Yes      Yes                           +---------+---------------+---------+-----------+----------+-------+ SFJ      Full           Yes      Yes                          +---------+---------------+---------+-----------+----------+-------+ FV Prox  Full           Yes      Yes                          +---------+---------------+---------+-----------+----------+-------+ FV Mid   Full                                                 +---------+---------------+---------+-----------+----------+-------+ FV DistalFull           Yes      Yes                          +---------+---------------+---------+-----------+----------+-------+ PFV      Full           Yes      Yes                          +---------+---------------+---------+-----------+----------+-------+ POP      Full           Yes      Yes                          +---------+---------------+---------+-----------+----------+-------+ PTV      Full           Yes      No                           +---------+---------------+---------+-----------+----------+-------+  PERO     Full           Yes      No                           +---------+---------------+---------+-----------+----------+-------+ Gastroc  Full                                                 +---------+---------------+---------+-----------+----------+-------+ GSV      Full           Yes      Yes                          +---------+---------------+---------+-----------+----------+-------+    Final Interpretation: Right: No evidence of deep vein thrombosis in the lower extremity. No indirect evidence of obstruction proximal to the inguinal ligament. Left: No evidence of deep vein thrombosis in the lower extremity. No indirect evidence of obstruction proximal to the inguinal ligament.  *See table(s) above for measurements and observations. Electronically signed by Ida Rogue MD on 07/23/2018 at 7:37:05 PM.    Final     Assessment & Plan:   Nancy was seen today for hypertension  and diabetes.  Diagnoses and all orders for this visit:  Tinnitus aurium, bilateral -     Ambulatory referral to ENT  Essential hypertension- His blood pressure is over controlled and he has had a decline in his GFR.  I have asked him to stop taking the thiazide diuretic but to stay on the ARB. -     Basic metabolic panel; Future -     olmesartan (BENICAR) 20 MG tablet; Take 1 tablet (20 mg total) by mouth daily.  Type 2 diabetes mellitus with complication ([place])- His blood sugar is adequately well controlled. -     Hemoglobin A1c; Future -     Basic metabolic panel; Future -     olmesartan (BENICAR) 20 MG tablet; Take 1 tablet (20 mg total) by mouth daily. -     Blood Glucose Monitoring Suppl (ACCU-CHEK GUIDE ME) w/Device KIT; 1 Act by Does not apply route 3 (three) times daily with meals.  Vitamin B12 deficiency anemia due to intrinsic factor deficiency- I have asked him to continue monthly B12 injections. -     CBC with Differential/Platelet; Future  Deficiency anemia -     CBC with Differential/Platelet; Future -     Vitamin B1; Future  Anemia, chronic disease- This is stable.  Chronic renal disease, stage 3, moderately decreased glomerular filtration rate (GFR) between 30-59 mL/min/1.73 square meter ([place])- He has had an increase in his serum creatinine over the last few months.  His blood pressure is over controlled so I have asked him to stop taking the thiazide diuretic.  He will avoid nephrotoxic agents and will continue taking the ARB.   I have discontinued Carloyn Manner B. Hast's olmesartan-hydrochlorothiazide. I am also having him start on olmesartan and Accu-Chek Guide Me. Additionally, I am having him maintain his tamsulosin, glucose blood, ezetimibe, ranolazine, FreeStyle Libre 14 Day Reader, levocetirizine, insulin lispro protamine-lispro, aspirin, carvedilol, isosorbide mononitrate, pantoprazole, gabapentin, FreeStyle Libre Sensor System, cyanocobalamin, nitroGLYCERIN, traMADol,  omega-3 acid ethyl esters, atorvastatin, Janumet, ketoconazole, insulin NPH-regular Human, Insulin Syringe-Needle U-100, ciprofloxacin, dapagliflozin propanediol, fluticasone, polyethylene glycol-electrolytes, and amoxicillin-clavulanate.  Meds ordered this  encounter  Medications  . olmesartan (BENICAR) 20 MG tablet    Sig: Take 1 tablet (20 mg total) by mouth daily.    Dispense:  90 tablet    Refill:  1  . Blood Glucose Monitoring Suppl (ACCU-CHEK GUIDE ME) w/Device KIT    Sig: 1 Act by Does not apply route 3 (three) times daily with meals.    Dispense:  2 kit    Refill:  0     Follow-up: Return in about 3 months (around 11/03/2019).  [name], MD

## 2019-08-05 DIAGNOSIS — Z0110 Encounter for examination of ears and hearing without abnormal findings: Secondary | ICD-10-CM | POA: Diagnosis not present

## 2019-08-05 DIAGNOSIS — H9313 Tinnitus, bilateral: Secondary | ICD-10-CM | POA: Diagnosis not present

## 2019-08-05 DIAGNOSIS — K08109 Complete loss of teeth, unspecified cause, unspecified class: Secondary | ICD-10-CM | POA: Diagnosis not present

## 2019-08-05 DIAGNOSIS — H6123 Impacted cerumen, bilateral: Secondary | ICD-10-CM | POA: Insufficient documentation

## 2019-08-06 LAB — VITAMIN B1: Vitamin B1 (Thiamine): 15 nmol/L (ref 8–30)

## 2019-08-07 ENCOUNTER — Encounter: Payer: Self-pay | Admitting: Internal Medicine

## 2019-08-07 DIAGNOSIS — N183 Chronic kidney disease, stage 3 unspecified: Secondary | ICD-10-CM | POA: Insufficient documentation

## 2019-08-08 ENCOUNTER — Encounter: Payer: PPO | Admitting: Gastroenterology

## 2019-08-10 ENCOUNTER — Ambulatory Visit (INDEPENDENT_AMBULATORY_CARE_PROVIDER_SITE_OTHER): Payer: PPO | Admitting: Podiatry

## 2019-08-10 ENCOUNTER — Other Ambulatory Visit: Payer: Self-pay

## 2019-08-10 DIAGNOSIS — M79676 Pain in unspecified toe(s): Secondary | ICD-10-CM

## 2019-08-10 DIAGNOSIS — B351 Tinea unguium: Secondary | ICD-10-CM | POA: Diagnosis not present

## 2019-08-10 DIAGNOSIS — M79609 Pain in unspecified limb: Secondary | ICD-10-CM

## 2019-08-10 NOTE — Patient Instructions (Signed)
Diabetes Mellitus and Foot Care Foot care is an important part of your health, especially when you have diabetes. Diabetes may cause you to have problems because of poor blood flow (circulation) to your feet and legs, which can cause your skin to:  Become thinner and drier.  Break more easily.  Heal more slowly.  Peel and crack. You may also have nerve damage (neuropathy) in your legs and feet, causing decreased feeling in them. This means that you may not notice minor injuries to your feet that could lead to more serious problems. Noticing and addressing any potential problems early is the best way to prevent future foot problems. How to care for your feet Foot hygiene  Wash your feet daily with warm water and mild soap. Do not use hot water. Then, pat your feet and the areas between your toes until they are completely dry. Do not soak your feet as this can dry your skin.  Trim your toenails straight across. Do not dig under them or around the cuticle. File the edges of your nails with an emery board or nail file.  Apply a moisturizing lotion or petroleum jelly to the skin on your feet and to dry, brittle toenails. Use lotion that does not contain alcohol and is unscented. Do not apply lotion between your toes. Shoes and socks  Wear clean socks or stockings every day. Make sure they are not too tight. Do not wear knee-high stockings since they may decrease blood flow to your legs.  Wear shoes that fit properly and have enough cushioning. Always look in your shoes before you put them on to be sure there are no objects inside.  To break in new shoes, wear them for just a few hours a day. This prevents injuries on your feet. Wounds, scrapes, corns, and calluses  Check your feet daily for blisters, cuts, bruises, sores, and redness. If you cannot see the bottom of your feet, use a mirror or ask someone for help.  Do not cut corns or calluses or try to remove them with medicine.  If you  find a minor scrape, cut, or break in the skin on your feet, keep it and the skin around it clean and dry. You may clean these areas with mild soap and water. Do not clean the area with peroxide, alcohol, or iodine.  If you have a wound, scrape, corn, or callus on your foot, look at it several times a day to make sure it is healing and not infected. Check for: ? Redness, swelling, or pain. ? Fluid or blood. ? Warmth. ? Pus or a bad smell. General instructions  Do not cross your legs. This may decrease blood flow to your feet.  Do not use heating pads or hot water bottles on your feet. They may burn your skin. If you have lost feeling in your feet or legs, you may not know this is happening until it is too late.  Protect your feet from hot and cold by wearing shoes, such as at the beach or on hot pavement.  Schedule a complete foot exam at least once a year (annually) or more often if you have foot problems. If you have foot problems, report any cuts, sores, or bruises to your health care provider immediately. Contact a health care provider if:  You have a medical condition that increases your risk of infection and you have any cuts, sores, or bruises on your feet.  You have an injury that is not   healing.  You have redness on your legs or feet.  You feel burning or tingling in your legs or feet.  You have pain or cramps in your legs and feet.  Your legs or feet are numb.  Your feet always feel cold.  You have pain around a toenail. Get help right away if:  You have a wound, scrape, corn, or callus on your foot and: ? You have pain, swelling, or redness that gets worse. ? You have fluid or blood coming from the wound, scrape, corn, or callus. ? Your wound, scrape, corn, or callus feels warm to the touch. ? You have pus or a bad smell coming from the wound, scrape, corn, or callus. ? You have a fever. ? You have a red line going up your leg. Summary  Check your feet every day  for cuts, sores, red spots, swelling, and blisters.  Moisturize feet and legs daily.  Wear shoes that fit properly and have enough cushioning.  If you have foot problems, report any cuts, sores, or bruises to your health care provider immediately.  Schedule a complete foot exam at least once a year (annually) or more often if you have foot problems. This information is not intended to replace advice given to you by your health care provider. Make sure you discuss any questions you have with your health care provider. Document Released: 12/05/2000 Document Revised: 01/20/2018 Document Reviewed: 01/09/2017 Elsevier Patient Education  2020 [employer]   Onychomycosis/Fungal Toenails  WHAT IS IT? An infection that lies within the keratin of your nail plate that is caused by a fungus.  WHY ME? Fungal infections affect all ages, sexes, races, and creeds.  There may be many factors that predispose you to a fungal infection such as age, coexisting medical conditions such as diabetes, or an autoimmune disease; stress, medications, fatigue, genetics, etc.  Bottom line: fungus thrives in a warm, moist environment and your shoes offer such a location.  IS IT CONTAGIOUS? Theoretically, yes.  You do not want to share shoes, nail clippers or files with someone who has fungal toenails.  Walking around barefoot in the same room or sleeping in the same bed is unlikely to transfer the organism.  It is important to realize, however, that fungus can spread easily from one nail to the next on the same foot.  HOW DO WE TREAT THIS?  There are several ways to treat this condition.  Treatment may depend on many factors such as age, medications, pregnancy, liver and kidney conditions, etc.  It is best to ask your doctor which options are available to you.  1. No treatment.   Unlike many other medical concerns, you can live with this condition.  However for many people this can be a painful condition and may lead to  ingrown toenails or a bacterial infection.  It is recommended that you keep the nails cut short to help reduce the amount of fungal nail. 2. Topical treatment.  These range from herbal remedies to prescription strength nail lacquers.  About 40-50% effective, topicals require twice daily application for approximately 9 to 12 months or until an entirely new nail has grown out.  The most effective topicals are medical grade medications available through physicians offices. 3. Oral antifungal medications.  With an 80-90% cure rate, the most common oral medication requires 3 to 4 months of therapy and stays in your system for a year as the new nail grows out.  Oral antifungal medications do require   blood work to make sure it is a safe drug for you.  A liver function panel will be performed prior to starting the medication and after the first month of treatment.  It is important to have the blood work performed to avoid any harmful side effects.  In general, this medication safe but blood work is required. 4. Laser Therapy.  This treatment is performed by applying a specialized laser to the affected nail plate.  This therapy is noninvasive, fast, and non-painful.  It is not covered by insurance and is therefore, out of pocket.  The results have been very good with a 80-95% cure rate.  [facility] is the only practice in the area to offer this therapy. 5. Permanent Nail Avulsion.  Removing the entire nail so that a new nail will not grow back. 

## 2019-08-20 DIAGNOSIS — G4733 Obstructive sleep apnea (adult) (pediatric): Secondary | ICD-10-CM | POA: Diagnosis not present

## 2019-08-22 ENCOUNTER — Encounter: Payer: Self-pay | Admitting: Podiatry

## 2019-08-22 NOTE — Progress Notes (Signed)
Subjective: [name] presents today with history of diabetes and painful, thick toenails 1-5 b/l that he cannot cut and which interfere with daily activities.  Pain is aggravated when wearing enclosed shoe gear.  His wife is present during this visit.  They voiced no new pedal problems on today's visit.   Current Outpatient Medications:  .  amoxicillin-clavulanate (AUGMENTIN) 500-125 MG tablet, Take 1 tablet by mouth 2 (two) times daily., Disp: , Rfl:  .  aspirin 81 MG EC tablet, Take 1 tablet (81 mg total) by mouth daily. Swallow whole., Disp: 90 tablet, Rfl: 1 .  atorvastatin (LIPITOR) 80 MG tablet, TAKE 1 TABLET (80 MG TOTAL) BY MOUTH DAILY AT 6 PM., Disp: 90 tablet, Rfl: 1 .  Blood Glucose Monitoring Suppl (ACCU-CHEK GUIDE ME) w/Device KIT, 1 Act by Does not apply route 3 (three) times daily with meals., Disp: 2 kit, Rfl: 0 .  carvedilol (COREG) 12.5 MG tablet, TAKE 1 TABLET BY MOUTH TWICE A DAY WITH A MEAL, Disp: 180 tablet, Rfl: 3 .  Cholecalciferol (VITAMIN D3) 25 MCG (1000 UT) CAPS, Take by mouth., Disp: , Rfl:  .  ciprofloxacin (CILOXAN) 0.3 % ophthalmic solution, PLACE 1 DROP INTO AFFECTED EYE EVERY 2 HOURS FOR 2 DAYS THEN 4 TIMES DAILYX 1 WEEK, Disp: 10 mL, Rfl: 0 .  Continuous Blood Gluc Receiver (FREESTYLE LIBRE 14 DAY READER) DEVI, USE AS DIRECTED FOR READINGS, Disp: 1 Device, Rfl: 11 .  Continuous Blood Gluc Sensor (FREESTYLE LIBRE SENSOR SYSTEM) MISC, Use 1 sensor every 14 days, then replace with new one., Disp: 6 each, Rfl: 3 .  cyanocobalamin 2000 MCG tablet, Take 1 tablet (2,000 mcg total) by mouth daily., Disp: 90 tablet, Rfl: 1 .  dapagliflozin propanediol (FARXIGA) 10 MG TABS tablet, Take 10 mg by mouth daily., Disp: 90 tablet, Rfl: 1 .  ezetimibe (ZETIA) 10 MG tablet, Take 1 tablet (10 mg total) by mouth daily., Disp: 90 tablet, Rfl: 3 .  fluticasone (FLONASE) 50 MCG/ACT nasal spray, SPRAY 2 SPRAYS INTO EACH NOSTRIL EVERY DAY, Disp: 48 mL, Rfl: 3 .  gabapentin  (NEURONTIN) 300 MG capsule, TAKE 1 CAPSULE BY MOUTH 3 TIMES A DAY, Disp: 270 capsule, Rfl: 2 .  glucose blood (ONETOUCH VERIO) test strip, Use BID to check BS. DX E11.9, Disp: 100 each, Rfl: 12 .  insulin lispro protamine-lispro (HUMALOG 75/25 MIX) (75-25) 100 UNIT/ML SUSP injection, , Disp: , Rfl:  .  insulin NPH-regular Human (NOVOLIN 70/30) (70-30) 100 UNIT/ML injection, INJECT 85 UINTS SUBCUTANEOUSLY IN THE AM AND 45 UNITS IN THE PM, Disp: 110 mL, Rfl: 3 .  Insulin Syringe-Needle U-100 (B-D INS SYR ULTRAFINE 1CC/30G) 30G X 1/2" 1 ML MISC, Use to inject insulin twice daily., Disp: 200 each, Rfl: 3 .  isosorbide mononitrate (IMDUR) 120 MG 24 hr tablet, Take 1 tablet (120 mg total) by mouth daily., Disp: 90 tablet, Rfl: 3 .  JANUMET 50-1000 MG tablet, TAKE 1 TABLET BY MOUTH TWICE A DAY WITH A MEAL, Disp: 180 tablet, Rfl: 1 .  ketoconazole (NIZORAL) 2 % cream, APPLY TO BOTH FEET AND BETWEEN TOES ONCE DAILY FOR 6 WEEKS., Disp: 30 g, Rfl: 1 .  levocetirizine (XYZAL) 5 MG tablet, Take 1 tablet (5 mg total) by mouth every evening., Disp: 90 tablet, Rfl: 3 .  naproxen sodium (ALEVE) 220 MG tablet, Take by mouth., Disp: , Rfl:  .  nitroGLYCERIN (NITROSTAT) 0.4 MG SL tablet, Place 1 tablet (0.4 mg total) under the tongue every 5 (five)  minutes as needed for chest pain., Disp: 25 tablet, Rfl: 3 .  olmesartan (BENICAR) 20 MG tablet, Take 1 tablet (20 mg total) by mouth daily., Disp: 90 tablet, Rfl: 1 .  omega-3 acid ethyl esters (LOVAZA) 1 g capsule, TAKE 2 CAPSULES (2 G TOTAL) BY MOUTH 2 (TWO) TIMES DAILY., Disp: 360 capsule, Rfl: 1 .  pantoprazole (PROTONIX) 40 MG tablet, TAKE 1 TABLET BY MOUTH TWICE A DAY, Disp: 180 tablet, Rfl: 3 .  polyethylene glycol-electrolytes (NULYTELY/GOLYTELY) 420 g solution, Take 4,000 mLs by mouth as directed., Disp: 4000 mL, Rfl: 0 .  ranolazine (RANEXA) 500 MG 12 hr tablet, Take 1 tablet (500 mg total) by mouth 2 (two) times daily., Disp: 180 tablet, Rfl: 3 .  tamsulosin  (FLOMAX) 0.4 MG CAPS capsule, Take 1 capsule (0.4 mg total) by mouth daily., Disp: 15 capsule, Rfl: 1 .  traMADol (ULTRAM) 50 MG tablet, Take 1 tablet (50 mg total) by mouth every 8 (eight) hours as needed., Disp: 270 tablet, Rfl: 1  Allergies  Allergen Reactions  . Furosemide     pancreatitis  . Amlodipine Swelling    LE EDEMA  . Diamox [Acetazolamide] Itching    hives  . Lisinopril Rash    Objective: There were no vitals filed for this visit.  Vascular Examination: Capillary refill time less than 3 seconds to all 10 digits.  Dorsalis pedis and Posterior tibial pulses nonpalpable bilaterally.  Digital hair absent x10 digits.  Skin temperature gradient WNL b/l.   Dermatological Examination: Skin with normal turgor, texture and tone b/l  Toenails 1-5 b/l discolored, thick, dystrophic with subungual debris and pain with palpation to nailbeds due to thickness of nails.  Musculoskeletal: Muscle strength 5/5 to all LE muscle groups of the left lower extremity.  Foot drop right lower extremity; right lower extremity plantarflexion, inversion and eversion are  5/5.  No pain, crepitus or joint limitation noted with ROM.   Neurological: Sensation intact with 10 gram monofilament.  Vibratory sensation intact.  Assessment: Painful onychomycosis toenails 1-5 b/l   Plan: 1. Toenails 1-5 b/l were debrided in length and girth without iatrogenic bleeding. 2. Patient to continue soft, supportive shoe gear daily. 3. Patient to report any pedal injuries to medical professional immediately. 4. Follow up 3 months.  5. Patient/POA to call should there be a concern in the interim.

## 2019-08-25 ENCOUNTER — Other Ambulatory Visit: Payer: Self-pay | Admitting: Internal Medicine

## 2019-08-25 DIAGNOSIS — I2510 Atherosclerotic heart disease of native coronary artery without angina pectoris: Secondary | ICD-10-CM

## 2019-08-25 DIAGNOSIS — E785 Hyperlipidemia, unspecified: Secondary | ICD-10-CM

## 2019-08-30 ENCOUNTER — Other Ambulatory Visit: Payer: Self-pay | Admitting: Cardiology

## 2019-10-01 ENCOUNTER — Other Ambulatory Visit: Payer: Self-pay | Admitting: Internal Medicine

## 2019-10-01 ENCOUNTER — Other Ambulatory Visit (INDEPENDENT_AMBULATORY_CARE_PROVIDER_SITE_OTHER): Payer: Self-pay | Admitting: Specialist

## 2019-10-01 DIAGNOSIS — J301 Allergic rhinitis due to pollen: Secondary | ICD-10-CM

## 2019-10-04 ENCOUNTER — Other Ambulatory Visit: Payer: Self-pay | Admitting: Internal Medicine

## 2019-10-04 DIAGNOSIS — M961 Postlaminectomy syndrome, not elsewhere classified: Secondary | ICD-10-CM

## 2019-10-04 DIAGNOSIS — E0849 Diabetes mellitus due to underlying condition with other diabetic neurological complication: Secondary | ICD-10-CM

## 2019-10-04 DIAGNOSIS — M5416 Radiculopathy, lumbar region: Secondary | ICD-10-CM

## 2019-11-11 ENCOUNTER — Other Ambulatory Visit: Payer: Self-pay

## 2019-11-11 ENCOUNTER — Encounter: Payer: Self-pay | Admitting: Podiatry

## 2019-11-11 ENCOUNTER — Ambulatory Visit (INDEPENDENT_AMBULATORY_CARE_PROVIDER_SITE_OTHER): Payer: PPO | Admitting: Podiatry

## 2019-11-11 DIAGNOSIS — B351 Tinea unguium: Secondary | ICD-10-CM | POA: Diagnosis not present

## 2019-11-11 DIAGNOSIS — M79609 Pain in unspecified limb: Secondary | ICD-10-CM

## 2019-11-11 DIAGNOSIS — M79676 Pain in unspecified toe(s): Secondary | ICD-10-CM | POA: Diagnosis not present

## 2019-11-11 NOTE — Patient Instructions (Signed)
Diabetes Mellitus and Foot Care Foot care is an important part of your health, especially when you have diabetes. Diabetes may cause you to have problems because of poor blood flow (circulation) to your feet and legs, which can cause your skin to:  Become thinner and drier.  Break more easily.  Heal more slowly.  Peel and crack. You may also have nerve damage (neuropathy) in your legs and feet, causing decreased feeling in them. This means that you may not notice minor injuries to your feet that could lead to more serious problems. Noticing and addressing any potential problems early is the best way to prevent future foot problems. How to care for your feet Foot hygiene  Wash your feet daily with warm water and mild soap. Do not use hot water. Then, pat your feet and the areas between your toes until they are completely dry. Do not soak your feet as this can dry your skin.  Trim your toenails straight across. Do not dig under them or around the cuticle. File the edges of your nails with an emery board or nail file.  Apply a moisturizing lotion or petroleum jelly to the skin on your feet and to dry, brittle toenails. Use lotion that does not contain alcohol and is unscented. Do not apply lotion between your toes. Shoes and socks  Wear clean socks or stockings every day. Make sure they are not too tight. Do not wear knee-high stockings since they may decrease blood flow to your legs.  Wear shoes that fit properly and have enough cushioning. Always look in your shoes before you put them on to be sure there are no objects inside.  To break in new shoes, wear them for just a few hours a day. This prevents injuries on your feet. Wounds, scrapes, corns, and calluses  Check your feet daily for blisters, cuts, bruises, sores, and redness. If you cannot see the bottom of your feet, use a mirror or ask someone for help.  Do not cut corns or calluses or try to remove them with medicine.  If you  find a minor scrape, cut, or break in the skin on your feet, keep it and the skin around it clean and dry. You may clean these areas with mild soap and water. Do not clean the area with peroxide, alcohol, or iodine.  If you have a wound, scrape, corn, or callus on your foot, look at it several times a day to make sure it is healing and not infected. Check for: ? Redness, swelling, or pain. ? Fluid or blood. ? Warmth. ? Pus or a bad smell. General instructions  Do not cross your legs. This may decrease blood flow to your feet.  Do not use heating pads or hot water bottles on your feet. They may burn your skin. If you have lost feeling in your feet or legs, you may not know this is happening until it is too late.  Protect your feet from hot and cold by wearing shoes, such as at the beach or on hot pavement.  Schedule a complete foot exam at least once a year (annually) or more often if you have foot problems. If you have foot problems, report any cuts, sores, or bruises to your health care provider immediately. Contact a health care provider if:  You have a medical condition that increases your risk of infection and you have any cuts, sores, or bruises on your feet.  You have an injury that is not   healing.  You have redness on your legs or feet.  You feel burning or tingling in your legs or feet.  You have pain or cramps in your legs and feet.  Your legs or feet are numb.  Your feet always feel cold.  You have pain around a toenail. Get help right away if:  You have a wound, scrape, corn, or callus on your foot and: ? You have pain, swelling, or redness that gets worse. ? You have fluid or blood coming from the wound, scrape, corn, or callus. ? Your wound, scrape, corn, or callus feels warm to the touch. ? You have pus or a bad smell coming from the wound, scrape, corn, or callus. ? You have a fever. ? You have a red line going up your leg. Summary  Check your feet every day  for cuts, sores, red spots, swelling, and blisters.  Moisturize feet and legs daily.  Wear shoes that fit properly and have enough cushioning.  If you have foot problems, report any cuts, sores, or bruises to your health care provider immediately.  Schedule a complete foot exam at least once a year (annually) or more often if you have foot problems. This information is not intended to replace advice given to you by your health care provider. Make sure you discuss any questions you have with your health care provider. Document Released: 12/05/2000 Document Revised: 01/20/2018 Document Reviewed: 01/09/2017 [employer] Patient Education  2020 [employer]  

## 2019-11-19 NOTE — Progress Notes (Signed)
Subjective: [name] is seen today for follow up painful, elongated, thickened toenails bilateral feet that he cannot cut. Pain interferes with daily activities. Aggravating factor includes wearing enclosed shoe gear and relieved with periodic debridement.  His is blind and is accompanied by his son on today's visit.  Medications reviewed in chart.  Allergies  Allergen Reactions  . Furosemide     pancreatitis  . Amlodipine Swelling    LE EDEMA  . Diamox [Acetazolamide] Itching    hives  . Lisinopril Rash    Objective:  Vascular Examination: Capillary refill time <3 seconds b/l.   Dorsalis pedis present b/l.  Posterior tibial pulses present b/l.  Digital hair absent b/l.  Skin temperature gradient WNL b/l.   Dermatological Examination: Skin with normal turgor, texture and tone b/l.  Toenails 1-5 b/l discolored, thick, dystrophic with subungual debris and pain with palpation to nailbeds due to thickness of nails.  Musculoskeletal: Foot drop RLE.  Muscle strength 5/5 to muscle groups of LLE.  No pain, crepitus or joint limitation noted with ROM.   Neurological Examination: Protective sensation intact 5/5 with 10 gram monofilament bilaterally.  Vibratory sensation intact bilaterally.   Assessment: Painful onychomycosis toenails 1-5 b/l   Plan: 1. Toenails 1-5 b/l were debrided in length and girth without iatrogenic bleeding. 2. Patient to continue soft, supportive shoe gear daily. 3. Patient to report any pedal injuries to medical professional immediately. 4. Follow up 3 months.  5. Patient/POA to call should there be a concern in the interim.

## 2019-11-24 DIAGNOSIS — G4733 Obstructive sleep apnea (adult) (pediatric): Secondary | ICD-10-CM | POA: Diagnosis not present

## 2019-11-29 ENCOUNTER — Other Ambulatory Visit: Payer: Self-pay | Admitting: Cardiology

## 2019-11-30 ENCOUNTER — Telehealth: Payer: Self-pay | Admitting: *Deleted

## 2019-11-30 ENCOUNTER — Encounter: Payer: Self-pay | Admitting: Cardiology

## 2019-11-30 ENCOUNTER — Other Ambulatory Visit: Payer: Self-pay

## 2019-11-30 ENCOUNTER — Ambulatory Visit (INDEPENDENT_AMBULATORY_CARE_PROVIDER_SITE_OTHER): Payer: PPO | Admitting: Cardiology

## 2019-11-30 VITALS — BP 110/60 | HR 62 | Ht 70.0 in | Wt 252.4 lb

## 2019-11-30 DIAGNOSIS — I5022 Chronic systolic (congestive) heart failure: Secondary | ICD-10-CM | POA: Diagnosis not present

## 2019-11-30 DIAGNOSIS — I10 Essential (primary) hypertension: Secondary | ICD-10-CM | POA: Diagnosis not present

## 2019-11-30 DIAGNOSIS — I2510 Atherosclerotic heart disease of native coronary artery without angina pectoris: Secondary | ICD-10-CM

## 2019-11-30 MED ORDER — PANTOPRAZOLE SODIUM 40 MG PO TBEC: 40.0000 mg | DELAYED_RELEASE_TABLET | Freq: Two times a day (BID) | ORAL | 3 refills | Status: DC

## 2019-11-30 MED ORDER — RANOLAZINE ER 500 MG PO TB12: ORAL_TABLET | ORAL | 11 refills | Status: DC

## 2019-11-30 MED ORDER — CARVEDILOL 12.5 MG PO TABS: ORAL_TABLET | ORAL | 3 refills | Status: DC

## 2019-11-30 NOTE — Telephone Encounter (Signed)
Called pt per refill request, pt is due for f/u with Dr. Marlou Porch.  Left a message to call back to offer appt for today, 11:20 or tomorrow, 12/10.

## 2019-11-30 NOTE — Patient Instructions (Signed)
Medication Instructions:   Your physician recommends that you continue on your current medications as directed. Please refer to the Current Medication list given to you today.  *If you need a refill on your cardiac medications before your next appointment, please call your pharmacy*    Follow-Up: At [facility], you and your health needs are our priority.  As part of our continuing mission to provide you with exceptional heart care, we have created designated Provider Care Teams.  These Care Teams include your primary Cardiologist (physician) and Advanced Practice Providers (APPs -  Physician Assistants and Nurse Practitioners) who all work together to provide you with the care you need, when you need it.  Your next appointment:   12 month(s)  The format for your next appointment:   In Person  Provider:   Candee Furbish, MD

## 2019-11-30 NOTE — Progress Notes (Signed)
Cardiology Office Note:    Date:  11/30/2019   ID:  [name], DOB 08-10-1951, MRN [id number]  PCP:  [name], MD  Cardiologist:  Candee Furbish, MD  Electrophysiologist:  None   Referring MD: [name], MD     History of Present Illness:    [name] is a 68 y.o. male here for follow-up of coronary artery disease prior stenting to left circumflex with concomitant diabetes polyneuropathy hypertension hyperlipidemia obesity blindness sleep apnea.  Cardiac catheterization back in 2008 demonstrated circumflex disease and he had PCI with bare-metal stent placement by Dr. Ilda Foil at the time.  Right coronary artery was managed medically and not favorable for PCI.  In August 2017 and nuclear stress test was performed and showed no ischemia.  Quite a dramatic weight loss in the past from 290 pounds.  Overall, weight is creeping up.  Continuing to watch.  Looks like he had both irbesartan and losartan on his medicine list.  Vikki Ports was likely substituted for losartan during blackout period.  He would like to go back on losartan only.  We will refill.  He does feel some chest discomfort shortness of breath when walking quickly or long distances.  This is manageable for him.  11/30/2019-coronary artery disease follow-up.  Overall been doing quite well.  Denies any fevers chills nausea vomiting syncope bleeding.    Past Medical History:  Diagnosis Date  . Arthritis    "mild arthritis in hip"  . CAD (coronary artery disease)   . CHF (congestive heart failure) ([place])   . Complication of anesthesia    "lungs filled up with fluid"   . Diabetes mellitus    Type II  . Glaucoma   . Heart murmur   . HTN (hypertension)   . Hypercholesterolemia   . LBP (low back pain)   . Legally blind   . Peripheral neuropathy   . Sleep apnea   . Staph infection    "from back surgery"  . Trigger finger   . Ulcer   . Wears glasses    "to protect cornea" - legally blind    Past  Surgical History:  Procedure Laterality Date  . CHOLECYSTECTOMY  09/15/2012   Procedure: LAPAROSCOPIC CHOLECYSTECTOMY WITH INTRAOPERATIVE CHOLANGIOGRAM;  Surgeon: Pedro Earls, MD;  Location: [place] [facility];  Service: General;  Laterality: N/A;  . EYE SURGERY Bilateral    cataracts  . INGUINAL HERNIA REPAIR     right  . LUMBAR FUSION    . LUMBAR LAMINECTOMY/DECOMPRESSION MICRODISCECTOMY N/A 10/26/2015   Procedure: RIGHT AND CENTRAL LUMBAR LAMINECTOMY L3-4, RIGHT L5-S1 LATERAL RECESS DECOMPRESSION;  Surgeon: Jessy Oto, MD;  Location: [facility];  Service: Orthopedics;  Laterality: N/A;  . NECK SURGERY    . peptic ulcer dz surgery  pt was in his 20s   bleeding ulcer.   . Prosthetic Cornea placement, Right eye  2007   [place] Right 10/26/2015   Procedure: RELEASE TRIGGER FINGER RIGHT THUMB;  Surgeon: Jessy Oto, MD;  Location: [facility];  Service: Orthopedics;  Laterality: Right;    Current Medications: Current Meds  Medication Sig  . amoxicillin-clavulanate (AUGMENTIN) 500-125 MG tablet Take 1 tablet by mouth 2 (two) times daily.  Marland Kitchen aspirin 81 MG EC tablet Take 1 tablet (81 mg total) by mouth daily. Swallow whole.  Marland Kitchen atorvastatin (LIPITOR) 80 MG tablet TAKE 1 TABLET (80 MG TOTAL) BY MOUTH DAILY AT 6 PM.  .  Blood Glucose Monitoring Suppl (ACCU-CHEK GUIDE ME) w/Device KIT 1 Act by Does not apply route 3 (three) times daily with meals.  . carvedilol (COREG) 12.5 MG tablet TAKE 1 TABLET BY MOUTH TWICE A DAY WITH A MEAL  . Cholecalciferol (VITAMIN D3) 25 MCG (1000 UT) CAPS Take by mouth.  . ciprofloxacin (CILOXAN) 0.3 % ophthalmic solution PLACE 1 DROP INTO AFFECTED EYE EVERY 2 HOURS FOR 2 DAYS THEN 4 TIMES DAILYX 1 WEEK  . Continuous Blood Gluc Receiver (FREESTYLE LIBRE 14 DAY READER) DEVI USE AS DIRECTED FOR READINGS  . Continuous Blood Gluc Sensor (FREESTYLE LIBRE SENSOR SYSTEM) MISC Use 1 sensor every 14 days, then replace with new one.  . cyanocobalamin 2000 MCG  tablet Take 1 tablet (2,000 mcg total) by mouth daily.  . dapagliflozin propanediol (FARXIGA) 10 MG TABS tablet Take 10 mg by mouth daily.  Marland Kitchen ezetimibe (ZETIA) 10 MG tablet Take 1 tablet (10 mg total) by mouth daily.  . fluticasone (FLONASE) 50 MCG/ACT nasal spray SPRAY 2 SPRAYS INTO EACH NOSTRIL EVERY DAY  . gabapentin (NEURONTIN) 300 MG capsule TAKE 1 CAPSULE BY MOUTH THREE TIMES A DAY  . glucose blood (ONETOUCH VERIO) test strip Use BID to check BS. DX E11.9  . insulin lispro protamine-lispro (HUMALOG 75/25 MIX) (75-25) 100 UNIT/ML SUSP injection   . insulin NPH-regular Human (NOVOLIN 70/30) (70-30) 100 UNIT/ML injection INJECT 85 UINTS SUBCUTANEOUSLY IN THE AM AND 45 UNITS IN THE PM  . Insulin Syringe-Needle U-100 (B-D INS SYR ULTRAFINE 1CC/30G) 30G X 1/2" 1 ML MISC Use to inject insulin twice daily.  . isosorbide mononitrate (IMDUR) 120 MG 24 hr tablet Take 1 tablet (120 mg total) by mouth daily.  Marland Kitchen JANUMET 50-1000 MG tablet TAKE 1 TABLET BY MOUTH TWICE A DAY WITH MEALS  . ketoconazole (NIZORAL) 2 % cream APPLY TO BOTH FEET AND BETWEEN TOES ONCE DAILY FOR 6 WEEKS.  . levocetirizine (XYZAL) 5 MG tablet TAKE 1 TABLET BY MOUTH EVERY DAY IN THE EVENING  . losartan (COZAAR) 25 MG tablet   . naproxen sodium (ALEVE) 220 MG tablet Take by mouth.  . nitroGLYCERIN (NITROSTAT) 0.4 MG SL tablet Place 1 tablet (0.4 mg total) under the tongue every 5 (five) minutes as needed for chest pain.  Marland Kitchen olmesartan (BENICAR) 20 MG tablet Take 1 tablet (20 mg total) by mouth daily.  Marland Kitchen olmesartan-hydrochlorothiazide (BENICAR HCT) 40-12.5 MG tablet Take 1 tablet by mouth daily.  Marland Kitchen omega-3 acid ethyl esters (LOVAZA) 1 g capsule TAKE 2 CAPSULES BY MOUTH 2 TIMES DAILY  . pantoprazole (PROTONIX) 40 MG tablet Take 1 tablet (40 mg total) by mouth 2 (two) times daily.  . polyethylene glycol-electrolytes (NULYTELY/GOLYTELY) 420 g solution Take 4,000 mLs by mouth as directed.  . ranolazine (RANEXA) 500 MG 12 hr tablet TAKE 1  TABLET(500 MG) BY MOUTH TWICE DAILY.  . tamsulosin (FLOMAX) 0.4 MG CAPS capsule Take 1 capsule (0.4 mg total) by mouth daily.  . traMADol (ULTRAM) 50 MG tablet TAKE 1 TABLET (50 MG TOTAL) BY MOUTH EVERY 8 (EIGHT) HOURS AS NEEDED.  . [DISCONTINUED] carvedilol (COREG) 12.5 MG tablet TAKE 1 TABLET BY MOUTH TWICE A DAY WITH A MEAL  . [DISCONTINUED] pantoprazole (PROTONIX) 40 MG tablet TAKE 1 TABLET BY MOUTH TWICE A DAY  . [DISCONTINUED] ranolazine (RANEXA) 500 MG 12 hr tablet TAKE 1 TABLET(500 MG) BY MOUTH TWICE DAILY. CONTINUE GETTING REFILLS     Allergies:   Furosemide, Amlodipine, Diamox [acetazolamide], and Lisinopril   Social History  Socioeconomic History  . Marital status: Married    Spouse name: [name]  . Number of children: 3  . Years of education: 82  . Highest education level: Not on file  Occupational History  . Occupation: disabled    Comment: blind  Social Needs  . Financial resource strain: Not very hard  . Food insecurity    Worry: Sometimes true    Inability: Sometimes true  . Transportation needs    Medical: No    Non-medical: No  Tobacco Use  . Smoking status: Never Smoker  . Smokeless tobacco: Never Used  Substance and Sexual Activity  . Alcohol use: No  . Drug use: No  . Sexual activity: Not Currently  Lifestyle  . Physical activity    Days per week: 0 days    Minutes per session: 0 min  . Stress: Not at all  Relationships  . Social connections    Talks on phone: More than three times a week    Gets together: More than three times a week    Attends religious service: More than 4 times per year    Active member of club or organization: Yes    Attends meetings of clubs or organizations: More than 4 times per year    Relationship status: Married  Other Topics Concern  . Not on file  Social History Narrative   Occupation: disabled, blind   Married   Regular Exercise-no   Lives at home with his wife.   Right-handed.   2-3 cups caffeine per day.      Family History: The patient's family history includes Breast cancer in his mother; Colon cancer in his mother and another family member; Diabetes in his brother, mother, and sister; Hypertension in his father and mother.  ROS:   Please see the history of present illness.    No myalgias, no fevers no bleeding no syncope all other systems reviewed and are negative.  EKGs/Labs/Other Studies Reviewed:    The following studies were reviewed today:  Echocardiogram 07/10/2017: - Left ventricle: The cavity size was normal. There was severe   concentric hypertrophy. Systolic function was normal. The   estimated ejection fraction was in the range of 60% to 65%. Wall   motion was normal; there were no regional wall motion   abnormalities. Doppler parameters are consistent with abnormal   left ventricular relaxation (grade 1 diastolic dysfunction).   Doppler parameters are consistent with elevated ventricular   end-diastolic filling pressure. - Aortic valve: Trileaflet; normal thickness leaflets. There was no   regurgitation. - Aortic root: The aortic root was normal in size. - Mitral valve: Structurally normal valve. There was mild   regurgitation. Valve area by pressure half-time: 1.29 cm^2. - Left atrium: The atrium was normal in size. - Right ventricle: Systolic function was normal. - Right atrium: The atrium was normal in size. - Tricuspid valve: There was no regurgitation. - Pulmonary arteries: Systolic pressure was within the normal   range. - Inferior vena cava: The vessel was normal in size. - Pericardium, extracardiac: There was no pericardial effusion.  Nuclear stress test 07/27/16:  - Low risk, no ischemia, ejection fraction 56%  Lower extremity Dopplers 07/22/2018: -No DVTs  EKG:  EKG is ordered today.  The ekg ordered today demonstrates normal sinus rhythm heart rate 72 bpm sinus rhythm first-degree AV block 220 ms low voltage QRS likely secondary to body habitus poor R  wave progression personally reviewed and interpreted.  Recent Labs: 02/24/2019: ALT  13; TSH 1.15 08/03/2019: BUN 22; Creatinine, Ser 1.52; Hemoglobin 9.9; Platelets 211.0; Potassium 5.0; Sodium 139  Recent Lipid Panel    Component Value Date/Time   CHOL 104 02/24/2019 1556   TRIG 146.0 02/24/2019 1556   HDL 25.50 (L) 02/24/2019 1556   CHOLHDL 4 02/24/2019 1556   VLDL 29.2 02/24/2019 1556   LDLCALC 49 02/24/2019 1556   LDLDIRECT 82.7 04/04/2013 1451    Physical Exam:    VS:  BP 110/60   Pulse 62   Ht 5' 10"  (1.778 m)   Wt 252 lb 6.4 oz (114.5 kg)   SpO2 98%   BMI 36.22 kg/m     Wt Readings from Last 3 Encounters:  11/30/19 252 lb 6.4 oz (114.5 kg)  08/03/19 239 lb (108.4 kg)  07/19/19 240 lb (108.9 kg)     GEN: Well nourished, well developed, in no acute distress, obese  HEENT: blind Neck: no JVD, carotid bruits, or masses Cardiac: RRR; no murmurs, rubs, or gallops, mild LE edema  Respiratory:  clear to auscultation bilaterally, normal work of breathing GI: soft, nontender, nondistended, + BS MS: no deformity or atrophy  Skin: warm and dry, no rash Neuro:  Alert and Oriented x 3, Strength and sensation are intact Psych: euthymic mood, full affect   ASSESSMENT:    1. Coronary artery disease involving native coronary artery of native heart without angina pectoris   2. Essential hypertension   3. Chronic systolic congestive heart failure (HCC)    PLAN:    In order of problems listed above:  Coronary artery disease - Isosorbide high-dose, Ranexa (had some trouble with prior authorization in the past) nuclear stress test 2017 was reassuring.  Echocardiogram 2018 showed normal EF.  Continue with current medications.  No anginal symptoms.  Been quite stable on these medications.  Continue.  Diabetes with hyperlipidemia and hypertension - Overall [name] has been managing with excellent hemoglobin A1c. -Continue with angiotensin receptor blocker Benicar - Zetia,  atorvastatin, Lovaza-creatinine slightly increased from 1.1-1.5.  [name] aware. LDL 49 triglycerides 146  Morbid obesity -BMI greater than 35 with 2 or more comorbidities.  Continue to encourage weight loss, decrease carbohydrates.  Done a good job overall with weight loss  Blindness -No changes, followed [facility].  We will administer flu shot.  1 year follow up   Medication Adjustments/Labs and Tests Ordered: Current medicines are reviewed at length with the patient today.  Concerns regarding medicines are outlined above.  Orders Placed This Encounter  Procedures  . EKG 12-Lead   Meds ordered this encounter  Medications  . pantoprazole (PROTONIX) 40 MG tablet    Sig: Take 1 tablet (40 mg total) by mouth 2 (two) times daily.    Dispense:  180 tablet    Refill:  3  . carvedilol (COREG) 12.5 MG tablet    Sig: TAKE 1 TABLET BY MOUTH TWICE A DAY WITH A MEAL    Dispense:  180 tablet    Refill:  3  . ranolazine (RANEXA) 500 MG 12 hr tablet    Sig: TAKE 1 TABLET(500 MG) BY MOUTH TWICE DAILY.    Dispense:  60 tablet    Refill:  11    Patient Instructions  Medication Instructions:   Your physician recommends that you continue on your current medications as directed. Please refer to the Current Medication list given to you today.  *If you need a refill on your cardiac medications before your next appointment, please call your  pharmacy*    Follow-Up: At [facility], you and your health needs are our priority.  As part of our continuing mission to provide you with exceptional heart care, we have created designated Provider Care Teams.  These Care Teams include your primary Cardiologist (physician) and Advanced Practice Providers (APPs -  Physician Assistants and Nurse Practitioners) who all work together to provide you with the care you need, when you need it.  Your next appointment:   12 month(s)  The format for your next appointment:   In Person  Provider:   Candee Furbish, MD       Signed, Candee Furbish, MD  11/30/2019 12:11 PM    [place]

## 2020-01-11 ENCOUNTER — Other Ambulatory Visit: Payer: Self-pay | Admitting: Internal Medicine

## 2020-01-11 DIAGNOSIS — E118 Type 2 diabetes mellitus with unspecified complications: Secondary | ICD-10-CM

## 2020-01-11 DIAGNOSIS — I10 Essential (primary) hypertension: Secondary | ICD-10-CM

## 2020-01-18 ENCOUNTER — Other Ambulatory Visit: Payer: Self-pay | Admitting: Internal Medicine

## 2020-01-20 ENCOUNTER — Encounter: Payer: Self-pay | Admitting: Internal Medicine

## 2020-01-20 DIAGNOSIS — E118 Type 2 diabetes mellitus with unspecified complications: Secondary | ICD-10-CM

## 2020-01-20 DIAGNOSIS — Z794 Long term (current) use of insulin: Secondary | ICD-10-CM

## 2020-01-31 ENCOUNTER — Telehealth: Payer: Self-pay

## 2020-01-31 DIAGNOSIS — H05401 Unspecified enophthalmos, right eye: Secondary | ICD-10-CM | POA: Diagnosis not present

## 2020-01-31 DIAGNOSIS — H02051 Trichiasis without entropian right upper eyelid: Secondary | ICD-10-CM | POA: Diagnosis not present

## 2020-01-31 NOTE — Telephone Encounter (Signed)
RF RQ for Janumet 50-1000mg  tab, atorvastatin 80 mg tab

## 2020-02-08 ENCOUNTER — Other Ambulatory Visit: Payer: Self-pay

## 2020-02-08 ENCOUNTER — Encounter: Payer: Self-pay | Admitting: Internal Medicine

## 2020-02-08 ENCOUNTER — Ambulatory Visit (INDEPENDENT_AMBULATORY_CARE_PROVIDER_SITE_OTHER): Payer: PPO | Admitting: Internal Medicine

## 2020-02-08 VITALS — BP 160/78 | HR 66 | Temp 97.6°F | Resp 16 | Ht 70.0 in | Wt 251.0 lb

## 2020-02-08 DIAGNOSIS — N1832 Chronic kidney disease, stage 3b: Secondary | ICD-10-CM

## 2020-02-08 DIAGNOSIS — N400 Enlarged prostate without lower urinary tract symptoms: Secondary | ICD-10-CM

## 2020-02-08 DIAGNOSIS — D638 Anemia in other chronic diseases classified elsewhere: Secondary | ICD-10-CM | POA: Diagnosis not present

## 2020-02-08 DIAGNOSIS — E118 Type 2 diabetes mellitus with unspecified complications: Secondary | ICD-10-CM | POA: Diagnosis not present

## 2020-02-08 DIAGNOSIS — E785 Hyperlipidemia, unspecified: Secondary | ICD-10-CM | POA: Diagnosis not present

## 2020-02-08 DIAGNOSIS — Z23 Encounter for immunization: Secondary | ICD-10-CM | POA: Diagnosis not present

## 2020-02-08 DIAGNOSIS — D510 Vitamin B12 deficiency anemia due to intrinsic factor deficiency: Secondary | ICD-10-CM | POA: Diagnosis not present

## 2020-02-08 DIAGNOSIS — I10 Essential (primary) hypertension: Secondary | ICD-10-CM | POA: Diagnosis not present

## 2020-02-08 DIAGNOSIS — I5032 Chronic diastolic (congestive) heart failure: Secondary | ICD-10-CM

## 2020-02-08 DIAGNOSIS — Z0000 Encounter for general adult medical examination without abnormal findings: Secondary | ICD-10-CM | POA: Diagnosis not present

## 2020-02-08 DIAGNOSIS — I5042 Chronic combined systolic (congestive) and diastolic (congestive) heart failure: Secondary | ICD-10-CM

## 2020-02-08 DIAGNOSIS — I2510 Atherosclerotic heart disease of native coronary artery without angina pectoris: Secondary | ICD-10-CM

## 2020-02-08 DIAGNOSIS — E781 Pure hyperglyceridemia: Secondary | ICD-10-CM

## 2020-02-08 DIAGNOSIS — R972 Elevated prostate specific antigen [PSA]: Secondary | ICD-10-CM

## 2020-02-08 LAB — POCT GLYCOSYLATED HEMOGLOBIN (HGB A1C): Hemoglobin A1C: 6.4 % — AB (ref 4.0–5.6)

## 2020-02-08 LAB — PSA: PSA: 3.2

## 2020-02-08 MED ORDER — INDAPAMIDE 1.25 MG PO TABS: 1.2500 mg | ORAL_TABLET | Freq: Every day | ORAL | 0 refills | Status: DC

## 2020-02-08 NOTE — Progress Notes (Signed)
Subjective:  Patient ID: [name], male    DOB: 08-Jul-1951  Age: 69 y.o. MRN: [id number]  CC: Annual Exam, Anemia, Hypertension, Hyperlipidemia, Diabetes, Congestive Heart Failure, and Coronary Artery Disease  This visit occurred during the SARS-CoV-2 public health emergency.  Safety protocols were in place, including screening questions prior to the visit, additional usage of staff PPE, and extensive cleaning of exam room while observing appropriate contact time as indicated for disinfecting solutions.    HPI [name] presents for a CPX.  He is with his wife today.  They complain that over the last few weeks he has developed lower extremity edema.  He tells me he saw his cardiologist about a month or 2 ago and he was told that everything is okay.  He denies any recent episodes of chest pain, shortness of breath, or palpitations.  His weight has been stable.  He does not monitor his blood pressure or his blood sugar.  He and his wife are very confused about which medications should and should not be taken.  They do not think he is taking hydrochlorothiazide anymore.  It also looks like he is no longer using the 70/30 insulin.  In addition it looks like he is no longer taking the SGLT2 inhibitor.  Outpatient Medications Prior to Visit  Medication Sig Dispense Refill  . aspirin 81 MG EC tablet Take 1 tablet (81 mg total) by mouth daily. Swallow whole. 90 tablet 1  . Blood Glucose Monitoring Suppl (ACCU-CHEK GUIDE ME) w/Device KIT 1 Act by Does not apply route 3 (three) times daily with meals. 2 kit 0  . carvedilol (COREG) 12.5 MG tablet TAKE 1 TABLET BY MOUTH TWICE A DAY WITH A MEAL 180 tablet 3  . Cholecalciferol (VITAMIN D3) 25 MCG (1000 UT) CAPS Take by mouth.    . ciprofloxacin (CILOXAN) 0.3 % ophthalmic solution PLACE 1 DROP INTO AFFECTED EYE EVERY 2 HOURS FOR 2 DAYS THEN 4 TIMES DAILYX 1 WEEK 10 mL 0  . Continuous Blood Gluc Receiver (FREESTYLE LIBRE 14 DAY READER) DEVI USE AS  DIRECTED FOR READINGS 1 Device 11  . Continuous Blood Gluc Sensor (FREESTYLE LIBRE SENSOR SYSTEM) MISC Use 1 sensor every 14 days, then replace with new one. 6 each 3  . fluticasone (FLONASE) 50 MCG/ACT nasal spray SPRAY 2 SPRAYS INTO EACH NOSTRIL EVERY DAY 48 mL 3  . gabapentin (NEURONTIN) 300 MG capsule TAKE 1 CAPSULE BY MOUTH THREE TIMES A DAY 270 capsule 2  . glucose blood (ONETOUCH VERIO) test strip Use BID to check BS. DX E11.9 100 each 12  . insulin NPH-regular Human (NOVOLIN 70/30) (70-30) 100 UNIT/ML injection INJECT 85 UINTS SUBCUTANEOUSLY IN THE AM AND 45 UNITS IN THE PM 110 mL 3  . Insulin Syringe-Needle U-100 (B-D INS SYR ULTRAFINE 1CC/30G) 30G X 1/2" 1 ML MISC Use to inject insulin twice daily. 200 each 3  . isosorbide mononitrate (IMDUR) 120 MG 24 hr tablet Take 1 tablet (120 mg total) by mouth daily. 90 tablet 3  . levocetirizine (XYZAL) 5 MG tablet TAKE 1 TABLET BY MOUTH EVERY DAY IN THE EVENING 90 tablet 3  . olmesartan (BENICAR) 20 MG tablet TAKE 1 TABLET BY MOUTH EVERY DAY 90 tablet 0  . omega-3 acid ethyl esters (LOVAZA) 1 g capsule TAKE 2 CAPSULES BY MOUTH 2 TIMES DAILY 360 capsule 1  . pantoprazole (PROTONIX) 40 MG tablet Take 1 tablet (40 mg total) by mouth 2 (two) times daily. 180 tablet 3  .  polyethylene glycol-electrolytes (NULYTELY/GOLYTELY) 420 g solution Take 4,000 mLs by mouth as directed. 4000 mL 0  . ranolazine (RANEXA) 500 MG 12 hr tablet TAKE 1 TABLET(500 MG) BY MOUTH TWICE DAILY. 60 tablet 11  . traMADol (ULTRAM) 50 MG tablet TAKE 1 TABLET (50 MG TOTAL) BY MOUTH EVERY 8 (EIGHT) HOURS AS NEEDED. 270 tablet 1  . amoxicillin-clavulanate (AUGMENTIN) 500-125 MG tablet Take 1 tablet by mouth 2 (two) times daily.    Marland Kitchen atorvastatin (LIPITOR) 80 MG tablet TAKE 1 TABLET (80 MG TOTAL) BY MOUTH DAILY AT 6 PM. 90 tablet 1  . cyanocobalamin 2000 MCG tablet Take 1 tablet (2,000 mcg total) by mouth daily. 90 tablet 1  . dapagliflozin propanediol (FARXIGA) 10 MG TABS tablet Take  10 mg by mouth daily. 90 tablet 1  . ezetimibe (ZETIA) 10 MG tablet Take 1 tablet (10 mg total) by mouth daily. 90 tablet 3  . insulin lispro protamine-lispro (HUMALOG 75/25 MIX) (75-25) 100 UNIT/ML SUSP injection     . JANUMET 50-1000 MG tablet TAKE 1 TABLET BY MOUTH TWICE A DAY WITH MEALS 180 tablet 1  . ketoconazole (NIZORAL) 2 % cream APPLY TO BOTH FEET AND BETWEEN TOES ONCE DAILY FOR 6 WEEKS. 30 g 1  . losartan (COZAAR) 25 MG tablet     . naproxen sodium (ALEVE) 220 MG tablet Take by mouth.    . olmesartan-hydrochlorothiazide (BENICAR HCT) 40-12.5 MG tablet Take 1 tablet by mouth daily.    . tamsulosin (FLOMAX) 0.4 MG CAPS capsule Take 1 capsule (0.4 mg total) by mouth daily. 15 capsule 1  . nitroGLYCERIN (NITROSTAT) 0.4 MG SL tablet Place 1 tablet (0.4 mg total) under the tongue every 5 (five) minutes as needed for chest pain. 25 tablet 3   No facility-administered medications prior to visit.    ROS Review of Systems  Constitutional: Negative.  Negative for appetite change, diaphoresis and fatigue.  HENT: Negative.   Eyes: Negative.   Respiratory: Negative for cough, chest tightness, shortness of breath and wheezing.   Cardiovascular: Positive for leg swelling. Negative for chest pain and palpitations.  Gastrointestinal: Negative for abdominal pain, constipation, diarrhea, nausea and vomiting.  Endocrine: Negative.  Negative for polydipsia, polyphagia and polyuria.  Genitourinary: Negative.  Negative for difficulty urinating.  Musculoskeletal: Positive for arthralgias. Negative for myalgias.  Skin: Negative.   Neurological: Negative.  Negative for dizziness, weakness, light-headedness and headaches.  Hematological: Negative for adenopathy. Does not bruise/bleed easily.  Psychiatric/Behavioral: Negative.     Objective:  BP (!) 160/78 (BP Location: Left Arm, Patient Position: Sitting, Cuff Size: Large)   Pulse 66   Temp 97.6 F (36.4 C) (Oral)   Resp 16   Ht 5' 10"  (1.778 m)    Wt 251 lb (113.9 kg)   SpO2 98%   BMI 36.01 kg/m   BP Readings from Last 3 Encounters:  02/08/20 (!) 160/78  11/30/19 110/60  08/03/19 108/62    Wt Readings from Last 3 Encounters:  02/08/20 251 lb (113.9 kg)  11/30/19 252 lb 6.4 oz (114.5 kg)  08/03/19 239 lb (108.4 kg)    Physical Exam Vitals reviewed.  Constitutional:      Appearance: He is obese.  HENT:     Nose: Nose normal.     Mouth/Throat:     Mouth: Mucous membranes are moist.  Eyes:     General: No scleral icterus. Cardiovascular:     Rate and Rhythm: Normal rate and regular rhythm.     Heart sounds:  No murmur. No gallop.   Pulmonary:     Effort: Pulmonary effort is normal.     Breath sounds: No stridor. No wheezing, rhonchi or rales.  Abdominal:     General: Abdomen is protuberant. Bowel sounds are normal. There is no distension.     Palpations: Abdomen is soft. There is no hepatomegaly, splenomegaly or mass.     Tenderness: There is no abdominal tenderness.     Hernia: No hernia is present.  Musculoskeletal:        General: Normal range of motion.     Cervical back: Neck supple.     Right lower leg: 2+ Edema present.     Left lower leg: 2+ Edema present.  Lymphadenopathy:     Cervical: No cervical adenopathy.  Skin:    General: Skin is warm and dry.  Neurological:     General: No focal deficit present.     Mental Status: He is alert and oriented to person, place, and time. Mental status is at baseline.  Psychiatric:        Mood and Affect: Mood normal.        Behavior: Behavior normal.     Lab Results  Component Value Date   WBC 4.5 02/08/2020   HGB 11.7 (L) 02/08/2020   HCT 36.6 (L) 02/08/2020   PLT 281 02/08/2020   GLUCOSE 134 (H) 02/08/2020   CHOL 264 (H) 02/08/2020   TRIG 384 (H) 02/08/2020   HDL 26 (L) 02/08/2020   LDLDIRECT 82.7 04/04/2013   LDLCALC 173 (H) 02/08/2020   ALT 13 02/24/2019   AST 13 02/24/2019   NA 137 02/08/2020   K 4.9 02/08/2020   CL 105 02/08/2020    CREATININE 1.51 (H) 02/08/2020   BUN 19 02/08/2020   CO2 25 02/08/2020   TSH 1.15 02/24/2019   PSA 3.2 02/08/2020   INR 0.95 08/17/2010   HGBA1C 6.4 (A) 02/08/2020   MICROALBUR 0.5 02/08/2020    VAS [place] LOWER EXTREMITY VENOUS (DVT)  Result Date: 07/23/2018  Lower Venous DVT Study Indications: Edema, bilaterally. Denies SOB.  Risk Factors: None identified. Performing Technologist: Sharlett Iles RVT  Examination Guidelines: A complete evaluation includes B-mode imaging, spectral Doppler, color Doppler, and power Doppler as needed of all accessible portions of each vessel. Bilateral testing is considered an integral part of a complete examination. Limited examinations for reoccurring indications may be performed as noted. The reflux portion of the exam is performed with the patient in reverse Trendelenburg.  Right Venous Findings: +---------+---------------+---------+-----------+----------+-------+          CompressibilityPhasicitySpontaneityPropertiesSummary +---------+---------------+---------+-----------+----------+-------+ CFV      Full           Yes      Yes                          +---------+---------------+---------+-----------+----------+-------+ SFJ      Full           Yes      Yes                          +---------+---------------+---------+-----------+----------+-------+ FV Prox  Full           Yes      Yes                          +---------+---------------+---------+-----------+----------+-------+ FV Mid   Full                                                 +---------+---------------+---------+-----------+----------+-------+  FV DistalFull           Yes      Yes                          +---------+---------------+---------+-----------+----------+-------+ PFV      Full           Yes      Yes                          +---------+---------------+---------+-----------+----------+-------+ POP      Full           Yes      Yes                           +---------+---------------+---------+-----------+----------+-------+ PTV      Full           Yes      No                           +---------+---------------+---------+-----------+----------+-------+ PERO     Full           Yes      No                           +---------+---------------+---------+-----------+----------+-------+ Gastroc  Full                                                 +---------+---------------+---------+-----------+----------+-------+ GSV      Full           Yes      Yes                          +---------+---------------+---------+-----------+----------+-------+  Left Venous Findings: +---------+---------------+---------+-----------+----------+-------+          CompressibilityPhasicitySpontaneityPropertiesSummary +---------+---------------+---------+-----------+----------+-------+ CFV      Full           Yes      Yes                          +---------+---------------+---------+-----------+----------+-------+ SFJ      Full           Yes      Yes                          +---------+---------------+---------+-----------+----------+-------+ FV Prox  Full           Yes      Yes                          +---------+---------------+---------+-----------+----------+-------+ FV Mid   Full                                                 +---------+---------------+---------+-----------+----------+-------+ FV DistalFull           Yes      Yes                          +---------+---------------+---------+-----------+----------+-------+  PFV      Full           Yes      Yes                          +---------+---------------+---------+-----------+----------+-------+ POP      Full           Yes      Yes                          +---------+---------------+---------+-----------+----------+-------+ PTV      Full           Yes      No                            +---------+---------------+---------+-----------+----------+-------+ PERO     Full           Yes      No                           +---------+---------------+---------+-----------+----------+-------+ Gastroc  Full                                                 +---------+---------------+---------+-----------+----------+-------+ GSV      Full           Yes      Yes                          +---------+---------------+---------+-----------+----------+-------+    Final Interpretation: Right: No evidence of deep vein thrombosis in the lower extremity. No indirect evidence of obstruction proximal to the inguinal ligament. Left: No evidence of deep vein thrombosis in the lower extremity. No indirect evidence of obstruction proximal to the inguinal ligament.  *See table(s) above for measurements and observations. Electronically signed by Ida Rogue MD on 07/23/2018 at 7:37:05 PM.    Final     Assessment & Plan:   Horacio was seen today for annual exam, anemia, hypertension, hyperlipidemia, diabetes, congestive heart failure and coronary artery disease.  Diagnoses and all orders for this visit:  Type 2 diabetes mellitus with complication ([place])- His W1X is down to 6.4%.  I recommended that he stop taking the DPP 4 inhibitor.  I would like to control his blood sugar with Metformin and the SGLT2 inhibitor.  I have asked him and his wife to let me know if he recently has been using any insulin.  For now I recommended that he stop using the 7030 mix. -     POCT glycosylated hemoglobin (Hb A1C) -     Cancel: Basic metabolic panel -     Cancel: Microalbumin / creatinine urine ratio -     HM Diabetes Foot Exam -     Basic metabolic panel -     Microalbumin / creatinine urine ratio; Future -     Microalbumin / creatinine urine ratio -     dapagliflozin propanediol (FARXIGA) 10 MG TABS tablet; Take 10 mg by mouth daily. -     metFORMIN (GLUCOPHAGE) 500 MG tablet; Take 1 tablet (500 mg total) by  mouth 2 (two) times daily with a meal. -     Consult to  [facility] Care Management  Need for influenza vaccination -     Flu Vaccine QUAD High Dose(Fluad)  Essential hypertension- His blood pressure is not adequately well controlled.  I have asked them to make sure that the only ARB he is taking his olmesartan.  I have also asked him to make sure he is no longer taking hydrochlorothiazide.  I would like for him to take indapamide for blood pressure control and to manage lower extremity edema. -     Cancel: Basic metabolic panel -     Cancel: Urinalysis, Routine w reflex microscopic -     indapamide (LOZOL) 1.25 MG tablet; Take 1 tablet (1.25 mg total) by mouth daily. -     Basic metabolic panel -     Urinalysis, Routine w reflex microscopic; Future -     Urinalysis, Routine w reflex microscopic  Stage 3b chronic kidney disease- His renal function is stable.  I encouraged him to avoid nephrotoxic agents.  His blood sugar is adequately well controlled.  I will order to get better control of his blood pressure. -     Cancel: Basic metabolic panel -     Cancel: Urinalysis, Routine w reflex microscopic -     Cancel: Microalbumin / creatinine urine ratio -     Basic metabolic panel -     Microalbumin / creatinine urine ratio; Future -     Urinalysis, Routine w reflex microscopic; Future -     Urinalysis, Routine w reflex microscopic -     Microalbumin / creatinine urine ratio -     Consult to [facility] Care Management  Anemia, chronic disease-his H&H have improved some. -     Cancel: CBC with Differential/Platelet -     Cancel: Reticulocytes -     CBC with Differential/Platelet; Future -     Reticulocytes; Future -     Reticulocytes -     CBC with Differential/Platelet  Vitamin B12 deficiency anemia due to intrinsic factor deficiency- His H&H have improved.  His B12 level is too high.  I recommend that he stop taking the B12 supplement. -     Cancel: CBC with Differential/Platelet -     Cancel:  Folate -     Cancel: Vitamin B12 -     CBC with Differential/Platelet; Future -     Folate; Future -     Vitamin B12; Future -     Vitamin B12 -     Folate -     CBC with Differential/Platelet  Benign prostatic hyperplasia without lower urinary tract symptoms- His PSA is lower than it was previously.  This is a good sign that he does not have prostate cancer.  He has no symptoms that need to be treated. -     Cancel: PSA, total and free -     PSA, total and free; Future -     PSA, total and free  PSA elevation- See above -     Cancel: PSA, total and free -     PSA, total and free; Future -     PSA, total and free  Pure hyperglyceridemia-his triglycerides are too high.  I have asked him to improve his dietary and lifestyle modifications and to be more compliant with the fish oil supplement. -     Cancel: Lipid panel -     Lipid panel; Future -     Lipid panel  Dyslipidemia, goal LDL below 70- His LDL is way too high.  I have asked him to be compliant with the statin and to start taking bempedoic acid and ezetimibe. -     Cancel: Lipid panel -     Lipid panel; Future -     Lipid panel -     Bempedoic Acid-Ezetimibe (NEXLIZET) 180-10 MG TABS; Take 1 tablet by mouth daily.  Chronic diastolic heart failure (HCC) -     indapamide (LOZOL) 1.25 MG tablet; Take 1 tablet (1.25 mg total) by mouth daily. -     Consult to [place] Management  Chronic combined systolic and diastolic congestive heart failure ([place])- His only sign of fluid overload his lower extremity edema.  I have therefore asked him to start taking indapamide and to restart the SGLT2 inhibitor. -     indapamide (LOZOL) 1.25 MG tablet; Take 1 tablet (1.25 mg total) by mouth daily. -     dapagliflozin propanediol (FARXIGA) 10 MG TABS tablet; Take 10 mg by mouth daily. -     Consult to [place] Management  Need for pneumococcal vaccination -     Pneumococcal polysaccharide vaccine 23-valent greater than or equal to 2yo  subcutaneous/IM  Atherosclerosis of native coronary artery of native heart without angina pectoris -     Bempedoic Acid-Ezetimibe (NEXLIZET) 180-10 MG TABS; Take 1 tablet by mouth daily. -     atorvastatin (LIPITOR) 80 MG tablet; Take 1 tablet (80 mg total) by mouth daily at 6 PM. -     Consult to [place] Management  Hyperlipidemia LDL goal <70 -     atorvastatin (LIPITOR) 80 MG tablet; Take 1 tablet (80 mg total) by mouth daily at 6 PM.   I have discontinued Carloyn Manner B. Glasco's tamsulosin, ezetimibe, insulin lispro protamine-lispro, cyanocobalamin, ketoconazole, amoxicillin-clavulanate, naproxen sodium, Janumet, losartan, and olmesartan-hydrochlorothiazide. I am also having him start on indapamide, Nexlizet, and metFORMIN. Additionally, I am having him maintain his glucose blood, FreeStyle Libre 14 Day Reader, aspirin, isosorbide mononitrate, FreeStyle Libre Sensor System, nitroGLYCERIN, insulin NPH-regular Human, Insulin Syringe-Needle U-100, ciprofloxacin, fluticasone, polyethylene glycol-electrolytes, Accu-Chek Guide Me, Vitamin D3, levocetirizine, gabapentin, traMADol, pantoprazole, carvedilol, ranolazine, olmesartan, omega-3 acid ethyl esters, Farxiga, and atorvastatin.  Meds ordered this encounter  Medications  . indapamide (LOZOL) 1.25 MG tablet    Sig: Take 1 tablet (1.25 mg total) by mouth daily.    Dispense:  90 tablet    Refill:  0  . dapagliflozin propanediol (FARXIGA) 10 MG TABS tablet    Sig: Take 10 mg by mouth daily.    Dispense:  90 tablet    Refill:  1  . Bempedoic Acid-Ezetimibe (NEXLIZET) 180-10 MG TABS    Sig: Take 1 tablet by mouth daily.    Dispense:  90 tablet    Refill:  1  . atorvastatin (LIPITOR) 80 MG tablet    Sig: Take 1 tablet (80 mg total) by mouth daily at 6 PM.    Dispense:  90 tablet    Refill:  1  . metFORMIN (GLUCOPHAGE) 500 MG tablet    Sig: Take 1 tablet (500 mg total) by mouth 2 (two) times daily with a meal.    Dispense:  180 tablet    Refill:   1     Follow-up: Return in about 3 months (around 05/07/2020).  [name], MD

## 2020-02-08 NOTE — Patient Instructions (Signed)

## 2020-02-09 ENCOUNTER — Encounter: Payer: Self-pay | Admitting: Internal Medicine

## 2020-02-09 LAB — PSA, TOTAL AND FREE
PSA, % Free: 25 % (calc) — ABNORMAL LOW (ref >25)
PSA, Free: 0.8 ng/mL
PSA, Total: 3.2 ng/mL (ref <=4.0)

## 2020-02-09 LAB — BASIC METABOLIC PANEL
BUN/Creatinine Ratio: 13 (calc) (ref 6–22)
BUN: 19 mg/dL (ref 7–25)
CO2: 25 mmol/L (ref 20–32)
Calcium: 8.9 mg/dL (ref 8.6–10.3)
Chloride: 105 mmol/L (ref 98–110)
Creat: 1.51 mg/dL — ABNORMAL HIGH (ref 0.70–1.18)
Glucose, Bld: 134 mg/dL — ABNORMAL HIGH (ref 65–99)
Potassium: 4.9 mmol/L (ref 3.5–5.3)
Sodium: 137 mmol/L (ref 135–146)

## 2020-02-09 LAB — CBC WITH DIFFERENTIAL/PLATELET
Absolute Monocytes: 419 cells/uL (ref 200–950)
Basophils Absolute: 18 cells/uL (ref 0–200)
Basophils Relative: 0.4 %
Eosinophils Absolute: 198 cells/uL (ref 15–500)
Eosinophils Relative: 4.4 %
HCT: 36.6 % — ABNORMAL LOW (ref 38.5–50.0)
Hemoglobin: 11.7 g/dL — ABNORMAL LOW (ref 13.2–17.1)
Lymphs Abs: 2102 cells/uL (ref 850–3900)
MCH: 23.6 pg — ABNORMAL LOW (ref 27.0–33.0)
MCHC: 32 g/dL (ref 32.0–36.0)
MCV: 73.9 fL — ABNORMAL LOW (ref 80.0–100.0)
MPV: 13.4 fL — ABNORMAL HIGH (ref 7.5–12.5)
Monocytes Relative: 9.3 %
Neutro Abs: 1764 cells/uL (ref 1500–7800)
Neutrophils Relative %: 39.2 %
Platelets: 281 10*3/uL (ref 140–400)
RBC: 4.95 10*6/uL (ref 4.20–5.80)
RDW: 13.9 % (ref 11.0–15.0)
Total Lymphocyte: 46.7 %
WBC: 4.5 10*3/uL (ref 3.8–10.8)

## 2020-02-09 LAB — URINALYSIS, ROUTINE W REFLEX MICROSCOPIC
Bilirubin Urine: NEGATIVE
Glucose, UA: NEGATIVE
Hgb urine dipstick: NEGATIVE
Ketones, ur: NEGATIVE
Leukocytes,Ua: NEGATIVE
Nitrite: NEGATIVE
Protein, ur: NEGATIVE
Specific Gravity, Urine: 1.019 (ref 1.001–1.03)
pH: 5.5 (ref 5.0–8.0)

## 2020-02-09 LAB — LIPID PANEL
Cholesterol: 264 mg/dL — ABNORMAL HIGH (ref <200)
HDL: 26 mg/dL — ABNORMAL LOW (ref >=40)
LDL Cholesterol (Calc): 173 mg/dL (calc) — ABNORMAL HIGH
Non-HDL Cholesterol (Calc): 238 mg/dL (calc) — ABNORMAL HIGH (ref <130)
Total CHOL/HDL Ratio: 10.2 (calc) — ABNORMAL HIGH (ref <5.0)
Triglycerides: 384 mg/dL — ABNORMAL HIGH (ref <150)

## 2020-02-09 LAB — MICROALBUMIN / CREATININE URINE RATIO
Creatinine, Urine: 163 mg/dL (ref 20–320)
Microalb Creat Ratio: 3 mcg/mg creat (ref <30)
Microalb, Ur: 0.5 mg/dL

## 2020-02-09 LAB — RETICULOCYTES
ABS Retic: 59400 cells/uL (ref 25000–9000)
Retic Ct Pct: 1.2 %

## 2020-02-09 LAB — FOLATE: Folate: 18.7 ng/mL

## 2020-02-09 LAB — VITAMIN B12: Vitamin B-12: >2000 pg/mL — ABNORMAL HIGH (ref 200–1100)

## 2020-02-09 MED ORDER — FARXIGA 10 MG PO TABS: 10.0000 mg | ORAL_TABLET | Freq: Every day | ORAL | 1 refills | Status: DC

## 2020-02-09 MED ORDER — NEXLIZET 180-10 MG PO TABS: 1.0000 | ORAL_TABLET | Freq: Every day | ORAL | 1 refills | Status: DC

## 2020-02-09 MED ORDER — ATORVASTATIN CALCIUM 80 MG PO TABS: 80.0000 mg | ORAL_TABLET | Freq: Every day | ORAL | 1 refills | Status: DC

## 2020-02-09 MED ORDER — METFORMIN HCL 500 MG PO TABS: 500.0000 mg | ORAL_TABLET | Freq: Two times a day (BID) | ORAL | 1 refills | Status: DC

## 2020-02-09 NOTE — Assessment & Plan Note (Signed)
Exam completed Labs reviewed Vaccines reviewed and updated Colon cancer screening is up-to-date, Patient education was given.

## 2020-02-09 NOTE — Patient Outreach (Signed)
Received HTA Referral from [name] MD.  Sent to:  [facility] for processing. [facility]

## 2020-02-10 ENCOUNTER — Telehealth: Payer: Self-pay | Admitting: Internal Medicine

## 2020-02-10 DIAGNOSIS — I5042 Chronic combined systolic (congestive) and diastolic (congestive) heart failure: Secondary | ICD-10-CM

## 2020-02-10 DIAGNOSIS — E118 Type 2 diabetes mellitus with unspecified complications: Secondary | ICD-10-CM

## 2020-02-10 NOTE — Telephone Encounter (Signed)
    Pt c/o medication issue:  1. Name of Medication: dapagliflozin propanediol (FARXIGA) 10 MG TABS tablet  2. How are you currently taking this medication (dosage and times per day)? n/a  3. Are you having a reaction (difficulty breathing--STAT)? no  4. What is your medication issue? Pharmacy calling to request prior auth , please call  [phone])

## 2020-02-13 ENCOUNTER — Telehealth: Payer: Self-pay | Admitting: Internal Medicine

## 2020-02-13 DIAGNOSIS — Z794 Long term (current) use of insulin: Secondary | ICD-10-CM

## 2020-02-13 DIAGNOSIS — E118 Type 2 diabetes mellitus with unspecified complications: Secondary | ICD-10-CM

## 2020-02-13 MED ORDER — FREESTYLE LIBRE 14 DAY READER DEVI: 1.0000 | Freq: Once | 0 refills | Status: DC

## 2020-02-13 MED ORDER — FREESTYLE LIBRE SENSOR SYSTEM MISC: 3 refills | Status: DC

## 2020-02-13 NOTE — Telephone Encounter (Signed)
Key: [id number]

## 2020-02-13 NOTE — Telephone Encounter (Signed)
erx sent as requested.  

## 2020-02-13 NOTE — Telephone Encounter (Signed)
    Medication Requested:  Continuous Blood Gluc Sensor ([place]) MISC  Is medication on med list: yes (if no, inform pt they may need an appointment)  Is medication a controled: no (yes = last OV with PCP)  -Controlled Substances: Adderall, Ritalin, oxycodone, hydrocodone, methadone, alprazolam, etc  Last visit with PCP: 02/08/20  Is the OV > than 4 months: 02/08/20 (yes = schedule an appt if one is not already made)  Pharmacy (Name, [place]): [id number] [place], [place].

## 2020-02-14 ENCOUNTER — Telehealth: Payer: Self-pay | Admitting: Internal Medicine

## 2020-02-14 ENCOUNTER — Other Ambulatory Visit: Payer: Self-pay | Admitting: Internal Medicine

## 2020-02-14 ENCOUNTER — Telehealth: Payer: Self-pay

## 2020-02-14 DIAGNOSIS — E118 Type 2 diabetes mellitus with unspecified complications: Secondary | ICD-10-CM

## 2020-02-14 DIAGNOSIS — Z794 Long term (current) use of insulin: Secondary | ICD-10-CM

## 2020-02-14 MED ORDER — FREESTYLE LIBRE SENSOR SYSTEM MISC: 3 refills | Status: DC

## 2020-02-14 NOTE — Telephone Encounter (Signed)
    Suanne Marker from [employer] calling with patient and spouse on the phone. They are concerned that Dr [name] stopped Ketoconazole and Tamsulosin on 02/17.  Patient would like to clarify indeed he was to stop these medications Please call

## 2020-02-14 NOTE — Telephone Encounter (Signed)
Jack calling and states that they have started a PA for the Bempedoic Acid-Ezetimibe (NEXLIZET) 180-10 MG TABS States that the key code is: [id number]  CB#: [phone]

## 2020-02-14 NOTE — Telephone Encounter (Signed)
Also has questions about if patient should be on  insulin NPH-regular Human (NOVOLIN 70/30) (70-30) 100 UNIT/ML injection Patient states he picked up a 90 days supply in January.

## 2020-02-14 NOTE — Telephone Encounter (Signed)
Pt stated that he is not completely emptying and the stream is weak. Pt has been taking the tamsulosin.   Pt wanted to know if he can continue the tamsulosin?    Spoke to pt and spouse about the ketoconazole - informed to contact [facility] for refills.   Spoke to pt and spouse about not using the insulin or Janumet. And using the metformin and farxiga for sugar control.

## 2020-02-15 NOTE — Telephone Encounter (Signed)
PA has been started via cover my meds.

## 2020-02-18 ENCOUNTER — Other Ambulatory Visit: Payer: Self-pay | Admitting: Internal Medicine

## 2020-02-18 DIAGNOSIS — G4733 Obstructive sleep apnea (adult) (pediatric): Secondary | ICD-10-CM | POA: Diagnosis not present

## 2020-02-18 DIAGNOSIS — N401 Enlarged prostate with lower urinary tract symptoms: Secondary | ICD-10-CM

## 2020-02-20 ENCOUNTER — Other Ambulatory Visit: Payer: Self-pay | Admitting: *Deleted

## 2020-02-20 ENCOUNTER — Other Ambulatory Visit: Payer: Self-pay | Admitting: Cardiology

## 2020-02-23 ENCOUNTER — Encounter: Payer: Self-pay | Admitting: *Deleted

## 2020-02-23 NOTE — Patient Outreach (Signed)
Telephone outreach:  Talked with Mr. and Mrs. Hammock on speaker. Did medication reconciliation, updated EPIC record. Insulin was discontinued, Metformin and Farxiga started.  Flomax recently started, added to list. Pt has only been taking the Farxiga prn for elevated glucose like a sliding scale insulin. Educated that this is a routine med to be take every day no matter what his glucose levels are. Also his metformin. Requested that wife supervise his medication taking. She was not aware that he was not taking the farxiga as ordered. He and his wife report he has been able to take his meds on his own. He recognizes them by shape. He is truly totally blind. I don't see how he can identify every round pill with a med list like this. He could benefit from pill packs. [facility]. Advised an HTA Care Manager will be calling. In addition we talked about HF, weighing daily, reporting if wt goes up, low salt diet to prevent fluid wt gain. Falls prevention, arise slowly, stand still for 1 minute before walking, use cane always. Talked about CKD and why he has it (HTN, DM, HF) need to take meds, go to OVs, have labs as ordered.  Eulah Pont. Myrtie Neither, MSN, [facility] Gerontological Nurse Practitioner [facility] Care Management [phone]

## 2020-03-06 DIAGNOSIS — R3912 Poor urinary stream: Secondary | ICD-10-CM | POA: Diagnosis not present

## 2020-03-06 DIAGNOSIS — Z125 Encounter for screening for malignant neoplasm of prostate: Secondary | ICD-10-CM | POA: Diagnosis not present

## 2020-03-06 DIAGNOSIS — N401 Enlarged prostate with lower urinary tract symptoms: Secondary | ICD-10-CM | POA: Diagnosis not present

## 2020-03-18 ENCOUNTER — Other Ambulatory Visit: Payer: Self-pay | Admitting: Cardiology

## 2020-03-20 DIAGNOSIS — G4733 Obstructive sleep apnea (adult) (pediatric): Secondary | ICD-10-CM | POA: Diagnosis not present

## 2020-03-29 ENCOUNTER — Other Ambulatory Visit: Payer: Self-pay | Admitting: Internal Medicine

## 2020-03-29 DIAGNOSIS — Z794 Long term (current) use of insulin: Secondary | ICD-10-CM

## 2020-03-29 DIAGNOSIS — E118 Type 2 diabetes mellitus with unspecified complications: Secondary | ICD-10-CM

## 2020-04-10 ENCOUNTER — Other Ambulatory Visit: Payer: Self-pay | Admitting: Internal Medicine

## 2020-04-10 DIAGNOSIS — E118 Type 2 diabetes mellitus with unspecified complications: Secondary | ICD-10-CM

## 2020-04-10 DIAGNOSIS — I10 Essential (primary) hypertension: Secondary | ICD-10-CM

## 2020-04-26 DIAGNOSIS — G4733 Obstructive sleep apnea (adult) (pediatric): Secondary | ICD-10-CM | POA: Diagnosis not present

## 2020-05-01 DIAGNOSIS — Z4420 Encounter for fitting and adjustment of artificial eye, unspecified: Secondary | ICD-10-CM | POA: Diagnosis not present

## 2020-05-04 ENCOUNTER — Other Ambulatory Visit: Payer: Self-pay | Admitting: Internal Medicine

## 2020-05-04 DIAGNOSIS — I10 Essential (primary) hypertension: Secondary | ICD-10-CM

## 2020-05-04 DIAGNOSIS — I5042 Chronic combined systolic (congestive) and diastolic (congestive) heart failure: Secondary | ICD-10-CM

## 2020-05-04 DIAGNOSIS — I5032 Chronic diastolic (congestive) heart failure: Secondary | ICD-10-CM

## 2020-05-07 DIAGNOSIS — H04213 Epiphora due to excess lacrimation, bilateral lacrimal glands: Secondary | ICD-10-CM | POA: Diagnosis not present

## 2020-05-07 DIAGNOSIS — H44521 Atrophy of globe, right eye: Secondary | ICD-10-CM | POA: Diagnosis not present

## 2020-05-08 ENCOUNTER — Encounter: Payer: Self-pay | Admitting: Internal Medicine

## 2020-05-08 ENCOUNTER — Other Ambulatory Visit: Payer: Self-pay

## 2020-05-08 ENCOUNTER — Ambulatory Visit (INDEPENDENT_AMBULATORY_CARE_PROVIDER_SITE_OTHER): Payer: PPO | Admitting: Internal Medicine

## 2020-05-08 VITALS — BP 124/50 | HR 61 | Temp 98.5°F | Resp 16 | Ht 70.0 in | Wt 243.0 lb

## 2020-05-08 DIAGNOSIS — E785 Hyperlipidemia, unspecified: Secondary | ICD-10-CM

## 2020-05-08 DIAGNOSIS — D510 Vitamin B12 deficiency anemia due to intrinsic factor deficiency: Secondary | ICD-10-CM | POA: Diagnosis not present

## 2020-05-08 DIAGNOSIS — E875 Hyperkalemia: Secondary | ICD-10-CM | POA: Insufficient documentation

## 2020-05-08 DIAGNOSIS — E118 Type 2 diabetes mellitus with unspecified complications: Secondary | ICD-10-CM | POA: Diagnosis not present

## 2020-05-08 DIAGNOSIS — E781 Pure hyperglyceridemia: Secondary | ICD-10-CM | POA: Diagnosis not present

## 2020-05-08 DIAGNOSIS — N1832 Chronic kidney disease, stage 3b: Secondary | ICD-10-CM | POA: Diagnosis not present

## 2020-05-08 DIAGNOSIS — Z23 Encounter for immunization: Secondary | ICD-10-CM | POA: Diagnosis not present

## 2020-05-08 DIAGNOSIS — I10 Essential (primary) hypertension: Secondary | ICD-10-CM

## 2020-05-08 LAB — LIPID PANEL
Cholesterol: 93 mg/dL (ref 0–200)
HDL: 22.1 mg/dL — ABNORMAL LOW (ref >39.00)
LDL Cholesterol: 40 mg/dL (ref 0–99)
NonHDL: 71.11
Total CHOL/HDL Ratio: 4
Triglycerides: 158 mg/dL — ABNORMAL HIGH (ref 0.0–149.0)
VLDL: 31.6 mg/dL (ref 0.0–40.0)

## 2020-05-08 LAB — CBC WITH DIFFERENTIAL/PLATELET
Basophils Absolute: 0 10*3/uL (ref 0.0–0.1)
Basophils Relative: 0.4 % (ref 0.0–3.0)
Eosinophils Absolute: 0.3 10*3/uL (ref 0.0–0.7)
Eosinophils Relative: 4.2 % (ref 0.0–5.0)
HCT: 33.2 % — ABNORMAL LOW (ref 39.0–52.0)
Hemoglobin: 10.5 g/dL — ABNORMAL LOW (ref 13.0–17.0)
Lymphocytes Relative: 29.4 % (ref 12.0–46.0)
Lymphs Abs: 1.9 10*3/uL (ref 0.7–4.0)
MCHC: 31.7 g/dL (ref 30.0–36.0)
MCV: 73.5 fl — ABNORMAL LOW (ref 78.0–100.0)
Monocytes Absolute: 0.5 10*3/uL (ref 0.1–1.0)
Monocytes Relative: 7.1 % (ref 3.0–12.0)
Neutro Abs: 3.8 10*3/uL (ref 1.4–7.7)
Neutrophils Relative %: 58.9 % (ref 43.0–77.0)
Platelets: 224 10*3/uL (ref 150.0–400.0)
RBC: 4.52 Mil/uL (ref 4.22–5.81)
RDW: 15 % (ref 11.5–15.5)
WBC: 6.4 10*3/uL (ref 4.0–10.5)

## 2020-05-08 LAB — BASIC METABOLIC PANEL
BUN: 39 mg/dL — ABNORMAL HIGH (ref 6–23)
CO2: 26 mEq/L (ref 19–32)
Calcium: 9.1 mg/dL (ref 8.4–10.5)
Chloride: 105 mEq/L (ref 96–112)
Creatinine, Ser: 2.13 mg/dL — ABNORMAL HIGH (ref 0.40–1.50)
GFR: 37.27 mL/min — ABNORMAL LOW (ref >60.00)
Glucose, Bld: 118 mg/dL — ABNORMAL HIGH (ref 70–99)
Potassium: 6 mEq/L — ABNORMAL HIGH (ref 3.5–5.1)
Sodium: 138 mEq/L (ref 135–145)

## 2020-05-08 LAB — HEMOGLOBIN A1C: Hgb A1c MFr Bld: 8 % — ABNORMAL HIGH (ref 4.6–6.5)

## 2020-05-08 MED ORDER — SOLIQUA 100-33 UNT-MCG/ML ~~LOC~~ SOPN: 20.0000 [IU] | PEN_INJECTOR | Freq: Every day | SUBCUTANEOUS | 1 refills | Status: DC

## 2020-05-08 NOTE — Patient Instructions (Signed)
Type 2 Diabetes Mellitus, Diagnosis, Adult Type 2 diabetes (type 2 diabetes mellitus) is a long-term (chronic) disease. In type 2 diabetes, one or both of these problems may be present:  The pancreas does not make enough of a hormone called insulin.  Cells in the body do not respond properly to insulin that the body makes (insulin resistance). Normally, insulin allows blood sugar (glucose) to enter cells in the body. The cells use glucose for energy. Insulin resistance or lack of insulin causes excess glucose to build up in the blood instead of going into cells. As a result, high blood glucose (hyperglycemia) develops. What increases the risk? The following factors may make you more likely to develop type 2 diabetes:  Having a family member with type 2 diabetes.  Being overweight or obese.  Having an inactive (sedentary) lifestyle.  Having been diagnosed with insulin resistance.  Having a history of prediabetes, gestational diabetes, or polycystic ovary syndrome (PCOS).  Being of American-Indian, African-American, Hispanic/Latino, or Asian/Pacific Islander descent. What are the signs or symptoms? In the early stage of this condition, you may not have symptoms. Symptoms develop slowly and may include:  Increased thirst (polydipsia).  Increased hunger(polyphagia).  Increased urination (polyuria).  Increased urination during the night (nocturia).  Unexplained weight loss.  Frequent infections that keep coming back (recurring).  Fatigue.  Weakness.  Vision changes, such as blurry vision.  Cuts or bruises that are slow to heal.  Tingling or numbness in the hands or feet.  Dark patches on the skin (acanthosis nigricans). How is this diagnosed? This condition is diagnosed based on your symptoms, your medical history, a physical exam, and your blood glucose level. Your blood glucose may be checked with one or more of the following blood tests:  A fasting blood glucose (FBG)  test. You will not be allowed to eat (you will fast) for 8 hours or longer before a blood sample is taken.  A random blood glucose test. This test checks blood glucose at any time of day regardless of when you ate.  An A1c (hemoglobin A1c) blood test. This test provides information about blood glucose control over the previous 2-3 months.  An oral glucose tolerance test (OGTT). This test measures your blood glucose at two times: ? After fasting. This is your baseline blood glucose level. ? Two hours after drinking a beverage that contains glucose. You may be diagnosed with type 2 diabetes if:  Your FBG level is 126 mg/dL (7.0 mmol/L) or higher.  Your random blood glucose level is 200 mg/dL (11.1 mmol/L) or higher.  Your A1c level is 6.5% or higher.  Your OGTT result is higher than 200 mg/dL (11.1 mmol/L). These blood tests may be repeated to confirm your diagnosis. How is this treated? Your treatment may be managed by a specialist called an endocrinologist. Type 2 diabetes may be treated by following instructions from your health care provider about:  Making diet and lifestyle changes. This may include: ? Following an individualized nutrition plan that is developed by a diet and nutrition specialist (registered dietitian). ? Exercising regularly. ? Finding ways to manage stress.  Checking your blood glucose level as often as told.  Taking diabetes medicines or insulin daily. This helps to keep your blood glucose levels in the healthy range. ? If you use insulin, you may need to adjust the dosage depending on how physically active you are and what foods you eat. Your health care provider will tell you how to adjust your dosage.    Taking medicines to help prevent complications from diabetes, such as: ? Aspirin. ? Medicine to lower cholesterol. ? Medicine to control blood pressure. Your health care provider will set individualized treatment goals for you. Your goals will be based on  your age, other medical conditions you have, and how you respond to diabetes treatment. Generally, the goal of treatment is to maintain the following blood glucose levels:  Before meals (preprandial): 80-130 mg/dL (4.4-7.2 mmol/L).  After meals (postprandial): below 180 mg/dL (10 mmol/L).  A1c level: less than 7%. Follow these instructions at home: Questions to ask your health care provider  Consider asking the following questions: ? Do I need to meet with a diabetes educator? ? Where can I find a support group for people with diabetes? ? What equipment will I need to manage my diabetes at home? ? What diabetes medicines do I need, and when should I take them? ? How often do I need to check my blood glucose? ? What number can I call if I have questions? ? When is my next appointment? General instructions  Take over-the-counter and prescription medicines only as told by your health care provider.  Keep all follow-up visits as told by your health care provider. This is important.  For more information about diabetes, visit: ? American Diabetes Association (ADA): www.diabetes.org ? American Association of Diabetes Educators (AADE): www.diabeteseducator.org Contact a health care provider if:  Your blood glucose is at or above 240 mg/dL (13.3 mmol/L) for 2 days in a row.  You have been sick or have had a fever for 2 days or longer, and you are not getting better.  You have any of the following problems for more than 6 hours: ? You cannot eat or drink. ? You have nausea and vomiting. ? You have diarrhea. Get help right away if:  Your blood glucose is lower than 54 mg/dL (3.0 mmol/L).  You become confused or you have trouble thinking clearly.  You have difficulty breathing.  You have moderate or large ketone levels in your urine. Summary  Type 2 diabetes (type 2 diabetes mellitus) is a long-term (chronic) disease. In type 2 diabetes, the pancreas does not make enough of a  hormone called insulin, or cells in the body do not respond properly to insulin that the body makes (insulin resistance).  This condition is treated by making diet and lifestyle changes and taking diabetes medicines or insulin.  Your health care provider will set individualized treatment goals for you. Your goals will be based on your age, other medical conditions you have, and how you respond to diabetes treatment.  Keep all follow-up visits as told by your health care provider. This is important. This information is not intended to replace advice given to you by your health care provider. Make sure you discuss any questions you have with your health care provider. Document Revised: 02/05/2018 Document Reviewed: 01/11/2016 Elsevier Patient Education  2020 Elsevier Inc.  

## 2020-05-08 NOTE — Progress Notes (Signed)
Subjective:  Patient ID: [name], male    DOB: [birth year]  Age: 68 y.o. MRN: [id number]  CC: Anemia, Hyperlipidemia, and Diabetes  This visit occurred during the SARS-CoV-2 public health emergency.  Safety protocols were in place, including screening questions prior to the visit, additional usage of staff PPE, and extensive cleaning of exam room while observing appropriate contact time as indicated for disinfecting solutions.    HPI [name] presents for f/up - He thinks his blood sugar and blood pressure have been well controlled.  He is no longer using insulin.  He is compliant with his antihypertensives.  He has a stable degree of lower extremity edema but he denies any recent episodes of chest pain, shortness of breath, palpitations, or edema.  Outpatient Medications Prior to Visit  Medication Sig Dispense Refill  . aspirin 81 MG EC tablet Take 1 tablet (81 mg total) by mouth daily. Swallow whole. 90 tablet 1  . atorvastatin (LIPITOR) 80 MG tablet Take 1 tablet (80 mg total) by mouth daily at 6 PM. 90 tablet 1  . Bempedoic Acid-Ezetimibe (NEXLIZET) 180-10 MG TABS Take 1 tablet by mouth daily. 90 tablet 1  . Blood Glucose Monitoring Suppl (ACCU-CHEK GUIDE ME) w/Device KIT 1 Act by Does not apply route 3 (three) times daily with meals. 2 kit 0  . carvedilol (COREG) 12.5 MG tablet TAKE 1 TABLET BY MOUTH TWICE A DAY WITH A MEAL 180 tablet 3  . Cholecalciferol (VITAMIN D3) 25 MCG (1000 UT) CAPS Take by mouth.    . Continuous Blood Gluc Sensor (FREESTYLE LIBRE SENSOR SYSTEM) MISC Use 1 sensor every 14 days, then replace with new one. 6 each 3  . dapagliflozin propanediol (FARXIGA) 10 MG TABS tablet Take 10 mg by mouth daily. 90 tablet 1  . fluticasone (FLONASE) 50 MCG/ACT nasal spray SPRAY 2 SPRAYS INTO EACH NOSTRIL EVERY DAY 48 mL 3  . gabapentin (NEURONTIN) 300 MG capsule TAKE 1 CAPSULE BY MOUTH THREE TIMES A DAY 270 capsule 2  . glucose blood (ONETOUCH VERIO) test strip Use BID  to check BS. DX E11.9 100 each 12  . indapamide (LOZOL) 1.25 MG tablet TAKE 1 TABLET (1.25 MG TOTAL) BY MOUTH DAILY. 90 tablet 0  . isosorbide mononitrate (IMDUR) 120 MG 24 hr tablet TAKE 1 TABLET BY MOUTH EVERY DAY 90 tablet 2  . levocetirizine (XYZAL) 5 MG tablet TAKE 1 TABLET BY MOUTH EVERY DAY IN THE EVENING 90 tablet 3  . metFORMIN (GLUCOPHAGE) 500 MG tablet Take 1 tablet (500 mg total) by mouth 2 (two) times daily with a meal. 180 tablet 1  . omega-3 acid ethyl esters (LOVAZA) 1 g capsule TAKE 2 CAPSULES BY MOUTH 2 TIMES DAILY 360 capsule 1  . pantoprazole (PROTONIX) 40 MG tablet Take 1 tablet (40 mg total) by mouth 2 (two) times daily. 180 tablet 3  . ranolazine (RANEXA) 500 MG 12 hr tablet TAKE 1 TABLET(500 MG) BY MOUTH TWICE DAILY. 60 tablet 11  . tamsulosin (FLOMAX) 0.4 MG CAPS capsule Take 0.4 mg by mouth once.    . traMADol (ULTRAM) 50 MG tablet TAKE 1 TABLET (50 MG TOTAL) BY MOUTH EVERY 8 (EIGHT) HOURS AS NEEDED. 270 tablet 1  . olmesartan (BENICAR) 20 MG tablet TAKE 1 TABLET BY MOUTH EVERY DAY 90 tablet 1  . nitroGLYCERIN (NITROSTAT) 0.4 MG SL tablet Place 1 tablet (0.4 mg total) under the tongue every 5 (five) minutes as needed for chest pain. 25 tablet 3  No facility-administered medications prior to visit.    ROS Review of Systems  Constitutional: Negative for diaphoresis, fatigue and unexpected weight change.  HENT: Negative.   Eyes: Negative.   Respiratory: Negative.  Negative for cough, chest tightness, shortness of breath and wheezing.   Cardiovascular: Positive for leg swelling. Negative for chest pain and palpitations.  Gastrointestinal: Negative for abdominal pain, constipation, diarrhea, nausea and vomiting.  Endocrine: Negative for polydipsia, polyphagia and polyuria.  Genitourinary: Negative for difficulty urinating, frequency, hematuria and urgency.  Musculoskeletal: Negative for myalgias.  Skin: Negative.  Negative for color change.  Neurological: Negative  for dizziness, weakness and light-headedness.  Hematological: Negative for adenopathy. Does not bruise/bleed easily.  Psychiatric/Behavioral: Negative.     Objective:  BP (!) 124/50 (BP Location: Left Arm, Patient Position: Sitting, Cuff Size: Large)   Pulse 61   Temp 98.5 F (36.9 C) (Oral)   Resp 16   Ht '5\' 10"'$  (1.778 m)   Wt 243 lb (110.2 kg)   SpO2 98%   BMI 34.87 kg/m   BP Readings from Last 3 Encounters:  05/08/20 (!) 124/50  02/08/20 (!) 160/78  11/30/19 110/60    Wt Readings from Last 3 Encounters:  05/08/20 243 lb (110.2 kg)  02/08/20 251 lb (113.9 kg)  11/30/19 252 lb 6.4 oz (114.5 kg)    Physical Exam Constitutional:      General: He is not in acute distress.    Appearance: He is obese. He is not toxic-appearing.  HENT:     Nose: Nose normal.     Mouth/Throat:     Mouth: Mucous membranes are moist.  Eyes:     General: No scleral icterus.    Conjunctiva/sclera: Conjunctivae normal.  Cardiovascular:     Rate and Rhythm: Normal rate and regular rhythm.     Heart sounds: No murmur.  Pulmonary:     Effort: Pulmonary effort is normal.     Breath sounds: No stridor. No wheezing, rhonchi or rales.  Abdominal:     General: Abdomen is protuberant. Bowel sounds are normal. There is no distension.     Palpations: Abdomen is soft. There is no hepatomegaly, splenomegaly or mass.     Tenderness: There is no abdominal tenderness.  Musculoskeletal:        General: Normal range of motion.     Cervical back: Neck supple.     Right lower leg: Edema (trace pitting) present.     Left lower leg: Edema (trace pitting) present.  Lymphadenopathy:     Cervical: No cervical adenopathy.  Skin:    General: Skin is warm and dry.  Neurological:     General: No focal deficit present.     Mental Status: He is alert.  Psychiatric:        Mood and Affect: Mood normal.        Behavior: Behavior normal.     Lab Results  Component Value Date   WBC 6.4 05/08/2020   HGB 10.5  (L) 05/08/2020   HCT 33.2 (L) 05/08/2020   PLT 224.0 05/08/2020   GLUCOSE 118 (H) 05/08/2020   CHOL 93 05/08/2020   TRIG 158.0 (H) 05/08/2020   HDL 22.10 (L) 05/08/2020   LDLDIRECT 82.7 04/04/2013   LDLCALC 40 05/08/2020   ALT 13 02/24/2019   AST 13 02/24/2019   NA 138 05/08/2020   K 6.0 (H) 05/08/2020   CL 105 05/08/2020   CREATININE 2.13 (H) 05/08/2020   BUN 39 (H) 05/08/2020   CO2  26 05/08/2020   TSH 1.15 02/24/2019   PSA 3.2 02/08/2020   INR 0.95 08/17/2010   HGBA1C 8.0 (H) 05/08/2020   MICROALBUR 0.5 02/08/2020    VAS [place] LOWER EXTREMITY VENOUS (DVT)  Result Date: 07/23/2018  Lower Venous DVT Study Indications: Edema, bilaterally. Denies SOB.  Risk Factors: None identified. Performing Technologist: Sharlett Iles RVT  Examination Guidelines: A complete evaluation includes B-mode imaging, spectral Doppler, color Doppler, and power Doppler as needed of all accessible portions of each vessel. Bilateral testing is considered an integral part of a complete examination. Limited examinations for reoccurring indications may be performed as noted. The reflux portion of the exam is performed with the patient in reverse Trendelenburg.  Right Venous Findings: +---------+---------------+---------+-----------+----------+-------+          CompressibilityPhasicitySpontaneityPropertiesSummary +---------+---------------+---------+-----------+----------+-------+ CFV      Full           Yes      Yes                          +---------+---------------+---------+-----------+----------+-------+ SFJ      Full           Yes      Yes                          +---------+---------------+---------+-----------+----------+-------+ FV Prox  Full           Yes      Yes                          +---------+---------------+---------+-----------+----------+-------+ FV Mid   Full                                                  +---------+---------------+---------+-----------+----------+-------+ FV DistalFull           Yes      Yes                          +---------+---------------+---------+-----------+----------+-------+ PFV      Full           Yes      Yes                          +---------+---------------+---------+-----------+----------+-------+ POP      Full           Yes      Yes                          +---------+---------------+---------+-----------+----------+-------+ PTV      Full           Yes      No                           +---------+---------------+---------+-----------+----------+-------+ PERO     Full           Yes      No                           +---------+---------------+---------+-----------+----------+-------+ Gastroc  Full                                                 +---------+---------------+---------+-----------+----------+-------+  GSV      Full           Yes      Yes                          +---------+---------------+---------+-----------+----------+-------+  Left Venous Findings: +---------+---------------+---------+-----------+----------+-------+          CompressibilityPhasicitySpontaneityPropertiesSummary +---------+---------------+---------+-----------+----------+-------+ CFV      Full           Yes      Yes                          +---------+---------------+---------+-----------+----------+-------+ SFJ      Full           Yes      Yes                          +---------+---------------+---------+-----------+----------+-------+ FV Prox  Full           Yes      Yes                          +---------+---------------+---------+-----------+----------+-------+ FV Mid   Full                                                 +---------+---------------+---------+-----------+----------+-------+ FV DistalFull           Yes      Yes                           +---------+---------------+---------+-----------+----------+-------+ PFV      Full           Yes      Yes                          +---------+---------------+---------+-----------+----------+-------+ POP      Full           Yes      Yes                          +---------+---------------+---------+-----------+----------+-------+ PTV      Full           Yes      No                           +---------+---------------+---------+-----------+----------+-------+ PERO     Full           Yes      No                           +---------+---------------+---------+-----------+----------+-------+ Gastroc  Full                                                 +---------+---------------+---------+-----------+----------+-------+ GSV      Full           Yes      Yes                          +---------+---------------+---------+-----------+----------+-------+  Final Interpretation: Right: No evidence of deep vein thrombosis in the lower extremity. No indirect evidence of obstruction proximal to the inguinal ligament. Left: No evidence of deep vein thrombosis in the lower extremity. No indirect evidence of obstruction proximal to the inguinal ligament.  *See table(s) above for measurements and observations. Electronically signed by Ida Rogue MD on 07/23/2018 at 7:37:05 PM.    Final     Assessment & Plan:   Kenith was seen today for anemia, hyperlipidemia and diabetes.  Diagnoses and all orders for this visit:  Vitamin B12 deficiency anemia due to intrinsic factor deficiency- His anemia has worsened.  I will monitor his B12 and folate levels. -     CBC with Differential/Platelet; Future -     CBC with Differential/Platelet  Essential hypertension- His renal function has declined and he has a high potassium level.  He has now informed me that he is taking two ARB's.  I have asked him to stop both of the ARB's. -     Basic metabolic panel; Future -     Basic metabolic  panel  Type 2 diabetes mellitus with complication ([place])- His E4M is up to 8%.  Is asked him to restart a basal insulin and will also add an GLP-1 agonist.  Will continue the other glycemic agents. -     Basic metabolic panel; Future -     Hemoglobin A1c; Future -     Hemoglobin A1c -     Basic metabolic panel -     Insulin Glargine-Lixisenatide (SOLIQUA) 100-33 UNT-MCG/ML SOPN; Inject 20 Units into the skin daily. -     Consult to [facility]  Stage 3b chronic kidney disease- His renal function has declined significantly over the last 3 months.  I think this is because he is taking two ARB's.  I have asked him to discontinue the ARB's and to avoid any nephrotoxic agents. -     Basic metabolic panel; Future -     Basic metabolic panel -     Consult to [facility]  Dyslipidemia, goal LDL below 70- He has achieved his LDL goal and is doing well on the statin. -     Lipid panel; Future -     Lipid panel  Pure hyperglyceridemia- Improvement noted. -     Lipid panel; Future -     Lipid panel  Need for Tdap vaccination -     Tdap vaccine greater than or equal to 7yo IM  Hyperkalemia- He agrees to return for another office visit to undergo an EKG to see if there is any cardiotoxicity related to this.  In the meantime he agrees to stop taking the ARB's and to stop taking any potassium supplements.   I have discontinued Carloyn Manner B. Schwieger's olmesartan. I am also having him start on Soliqua. Additionally, I am having him maintain his glucose blood, aspirin, nitroGLYCERIN, fluticasone, Accu-Chek Guide Me, Vitamin D3, levocetirizine, gabapentin, traMADol, pantoprazole, carvedilol, ranolazine, omega-3 acid ethyl esters, Farxiga, Nexlizet, atorvastatin, metFORMIN, FreeStyle Libre Sensor System, tamsulosin, isosorbide mononitrate, and indapamide.  Meds ordered this encounter  Medications  . Insulin Glargine-Lixisenatide (SOLIQUA) 100-33 UNT-MCG/ML SOPN    Sig: Inject 20 Units into the  skin daily.    Dispense:  9 mL    Refill:  1   I spent 60 minutes in preparing to see the patient by review of recent labs, imaging and procedures, obtaining and reviewing separately obtained history, communicating with the patient and family  or caregiver, ordering medications, tests or procedures, and documenting clinical information in the EHR including the differential Dx, treatment, and any further evaluation and other management of 1. Vitamin B12 deficiency anemia due to intrinsic factor deficiency 2. Essential hypertension 3. Type 2 diabetes mellitus with complication (HCC) 4. Stage 3b chronic kidney disease 5. Dyslipidemia, goal LDL below 70 6. Pure hyperglyceridemia 7. Hyperkalemia    Follow-up: Return in about 6 months (around 11/08/2020).  [name], MD

## 2020-05-09 NOTE — Patient Outreach (Addendum)
Received a referral from Dr. Thomas Jones. Patient has [employer] insurance. Following their workflow I have sent this referral to the [facility] Utilization  Management group through email [email or web address] for Care Management 

## 2020-05-10 ENCOUNTER — Encounter: Payer: Self-pay | Admitting: Internal Medicine

## 2020-05-10 ENCOUNTER — Ambulatory Visit (INDEPENDENT_AMBULATORY_CARE_PROVIDER_SITE_OTHER): Payer: PPO | Admitting: Internal Medicine

## 2020-05-10 ENCOUNTER — Other Ambulatory Visit: Payer: Self-pay

## 2020-05-10 VITALS — BP 136/72 | HR 62 | Temp 98.7°F | Resp 16

## 2020-05-10 DIAGNOSIS — I2510 Atherosclerotic heart disease of native coronary artery without angina pectoris: Secondary | ICD-10-CM | POA: Diagnosis not present

## 2020-05-10 DIAGNOSIS — E875 Hyperkalemia: Secondary | ICD-10-CM

## 2020-05-10 DIAGNOSIS — I5042 Chronic combined systolic (congestive) and diastolic (congestive) heart failure: Secondary | ICD-10-CM

## 2020-05-10 DIAGNOSIS — D510 Vitamin B12 deficiency anemia due to intrinsic factor deficiency: Secondary | ICD-10-CM

## 2020-05-10 DIAGNOSIS — I10 Essential (primary) hypertension: Secondary | ICD-10-CM | POA: Diagnosis not present

## 2020-05-10 DIAGNOSIS — D638 Anemia in other chronic diseases classified elsewhere: Secondary | ICD-10-CM | POA: Diagnosis not present

## 2020-05-10 LAB — BASIC METABOLIC PANEL
BUN: 34 mg/dL — ABNORMAL HIGH (ref 6–23)
CO2: 28 mEq/L (ref 19–32)
Calcium: 9 mg/dL (ref 8.4–10.5)
Chloride: 105 mEq/L (ref 96–112)
Creatinine, Ser: 2.03 mg/dL — ABNORMAL HIGH (ref 0.40–1.50)
GFR: 39.4 mL/min — ABNORMAL LOW (ref >60.00)
Glucose, Bld: 125 mg/dL — ABNORMAL HIGH (ref 70–99)
Potassium: 5 mEq/L (ref 3.5–5.1)
Sodium: 139 mEq/L (ref 135–145)

## 2020-05-10 LAB — TROPONIN I (HIGH SENSITIVITY): High Sens Troponin I: 9 ng/L (ref 2–17)

## 2020-05-10 LAB — IBC PANEL
Iron: 102 ug/dL (ref 42–165)
Saturation Ratios: 30 % (ref 20.0–50.0)
Transferrin: 243 mg/dL (ref 212.0–360.0)

## 2020-05-10 LAB — FOLATE: Folate: 17.9 ng/mL (ref >5.9)

## 2020-05-10 LAB — VITAMIN B12: Vitamin B-12: 474 pg/mL (ref 211–911)

## 2020-05-10 LAB — FERRITIN: Ferritin: 39.5 ng/mL (ref 22.0–322.0)

## 2020-05-10 LAB — BRAIN NATRIURETIC PEPTIDE: Pro B Natriuretic peptide (BNP): 26 pg/mL (ref 0.0–100.0)

## 2020-05-10 MED ORDER — PEN NEEDLES 32G X 4 MM MISC: 3 refills | Status: DC

## 2020-05-10 NOTE — Progress Notes (Signed)
Subjective:  Patient ID: [name], male    DOB: [birth year]  Age: 69 y.o. MRN: [id number]  CC: Anemia, Coronary Artery Disease, Congestive Heart Failure, Hypertension, and Diabetes  This visit occurred during the SARS-CoV-2 public health emergency.  Safety protocols were in place, including screening questions prior to the visit, additional usage of staff PPE, and extensive cleaning of exam room while observing appropriate contact time as indicated for disinfecting solutions.    HPI [name] presents for f/up - He was seen 2 days for follow-up and found to have a potassium level of 6.0.  I was subsequently informed that he was taking 2 ARB/'s.  He and his wife tell me that he has stopped taking both of them.  He now informs me that over the last few weeks he has experienced some dyspnea on exertion.  His last myocardial perfusion imaging was about 4 years ago.  Outpatient Medications Prior to Visit  Medication Sig Dispense Refill  . aspirin 81 MG EC tablet Take 1 tablet (81 mg total) by mouth daily. Swallow whole. 90 tablet 1  . atorvastatin (LIPITOR) 80 MG tablet Take 1 tablet (80 mg total) by mouth daily at 6 PM. 90 tablet 1  . Bempedoic Acid-Ezetimibe (NEXLIZET) 180-10 MG TABS Take 1 tablet by mouth daily. 90 tablet 1  . Blood Glucose Monitoring Suppl (ACCU-CHEK GUIDE ME) w/Device KIT 1 Act by Does not apply route 3 (three) times daily with meals. 2 kit 0  . carvedilol (COREG) 12.5 MG tablet TAKE 1 TABLET BY MOUTH TWICE A DAY WITH A MEAL 180 tablet 3  . Cholecalciferol (VITAMIN D3) 25 MCG (1000 UT) CAPS Take by mouth.    . Continuous Blood Gluc Sensor (FREESTYLE LIBRE SENSOR SYSTEM) MISC Use 1 sensor every 14 days, then replace with new one. 6 each 3  . dapagliflozin propanediol (FARXIGA) 10 MG TABS tablet Take 10 mg by mouth daily. 90 tablet 1  . fluticasone (FLONASE) 50 MCG/ACT nasal spray SPRAY 2 SPRAYS INTO EACH NOSTRIL EVERY DAY 48 mL 3  . gabapentin (NEURONTIN) 300 MG  capsule TAKE 1 CAPSULE BY MOUTH THREE TIMES A DAY 270 capsule 2  . glucose blood (ONETOUCH VERIO) test strip Use BID to check BS. DX E11.9 100 each 12  . indapamide (LOZOL) 1.25 MG tablet TAKE 1 TABLET (1.25 MG TOTAL) BY MOUTH DAILY. 90 tablet 0  . Insulin Glargine-Lixisenatide (SOLIQUA) 100-33 UNT-MCG/ML SOPN Inject 20 Units into the skin daily. 9 mL 1  . isosorbide mononitrate (IMDUR) 120 MG 24 hr tablet TAKE 1 TABLET BY MOUTH EVERY DAY 90 tablet 2  . levocetirizine (XYZAL) 5 MG tablet TAKE 1 TABLET BY MOUTH EVERY DAY IN THE EVENING 90 tablet 3  . nitroGLYCERIN (NITROSTAT) 0.4 MG SL tablet Place 1 tablet (0.4 mg total) under the tongue every 5 (five) minutes as needed for chest pain. 25 tablet 3  . omega-3 acid ethyl esters (LOVAZA) 1 g capsule TAKE 2 CAPSULES BY MOUTH 2 TIMES DAILY 360 capsule 1  . pantoprazole (PROTONIX) 40 MG tablet Take 1 tablet (40 mg total) by mouth 2 (two) times daily. 180 tablet 3  . ranolazine (RANEXA) 500 MG 12 hr tablet TAKE 1 TABLET(500 MG) BY MOUTH TWICE DAILY. 60 tablet 11  . tamsulosin (FLOMAX) 0.4 MG CAPS capsule Take 0.4 mg by mouth once.    . traMADol (ULTRAM) 50 MG tablet TAKE 1 TABLET (50 MG TOTAL) BY MOUTH EVERY 8 (EIGHT) HOURS AS NEEDED. 270 tablet  1  . metFORMIN (GLUCOPHAGE) 500 MG tablet Take 1 tablet (500 mg total) by mouth 2 (two) times daily with a meal. 180 tablet 1   No facility-administered medications prior to visit.    ROS Review of Systems  Constitutional: Negative for chills and fever.  HENT: Negative.   Eyes: Negative for visual disturbance.  Respiratory: Positive for shortness of breath (DOE). Negative for cough, chest tightness and wheezing.   Cardiovascular: Negative for chest pain, palpitations and leg swelling.  Gastrointestinal: Negative for abdominal pain, constipation, diarrhea, nausea and vomiting.  Endocrine: Negative.   Genitourinary: Negative for difficulty urinating and dysuria.  Musculoskeletal: Negative for arthralgias  and myalgias.  Skin: Negative.   Neurological: Negative.  Negative for dizziness, weakness, light-headedness and headaches.  Hematological: Negative for adenopathy. Does not bruise/bleed easily.  Psychiatric/Behavioral: Negative.     Objective:  BP 136/72 (BP Location: Left Arm, Patient Position: Sitting, Cuff Size: Large)   Pulse 62   Temp 98.7 F (37.1 C) (Oral)   Resp 16   SpO2 97%   BP Readings from Last 3 Encounters:  05/10/20 136/72  05/08/20 (!) 124/50  02/08/20 (!) 160/78    Wt Readings from Last 3 Encounters:  05/08/20 243 lb (110.2 kg)  02/08/20 251 lb (113.9 kg)  11/30/19 252 lb 6.4 oz (114.5 kg)    Physical Exam Vitals reviewed.  Constitutional:      Appearance: He is not ill-appearing.  HENT:     Nose: Nose normal.     Mouth/Throat:     Mouth: Mucous membranes are moist.  Eyes:     General: No scleral icterus.    Conjunctiva/sclera: Conjunctivae normal.  Cardiovascular:     Rate and Rhythm: Normal rate and regular rhythm.     Heart sounds: No murmur.     Comments: EKG - NSR, 64 bpm No peaked T waves Q in III and aVF is not new No ST/T wave changes Pulmonary:     Effort: Pulmonary effort is normal.     Breath sounds: No stridor. No wheezing, rhonchi or rales.  Abdominal:     General: Abdomen is protuberant. Bowel sounds are normal. There is no distension.     Palpations: Abdomen is soft. There is no hepatomegaly, splenomegaly or mass.     Tenderness: There is no abdominal tenderness.  Musculoskeletal:        General: Normal range of motion.     Cervical back: Neck supple.     Right lower leg: No edema.     Left lower leg: No edema.  Lymphadenopathy:     Cervical: No cervical adenopathy.  Skin:    General: Skin is warm and dry.  Neurological:     General: No focal deficit present.     Mental Status: He is alert.  Psychiatric:        Mood and Affect: Mood normal.        Behavior: Behavior normal.     Lab Results  Component Value Date    WBC 6.4 05/08/2020   HGB 10.5 (L) 05/08/2020   HCT 33.2 (L) 05/08/2020   PLT 224.0 05/08/2020   GLUCOSE 125 (H) 05/10/2020   CHOL 93 05/08/2020   TRIG 158.0 (H) 05/08/2020   HDL 22.10 (L) 05/08/2020   LDLDIRECT 82.7 04/04/2013   LDLCALC 40 05/08/2020   ALT 13 02/24/2019   AST 13 02/24/2019   NA 139 05/10/2020   K 5.0 05/10/2020   CL 105 05/10/2020   CREATININE 2.03 (  H) 05/10/2020   BUN 34 (H) 05/10/2020   CO2 28 05/10/2020   TSH 1.15 02/24/2019   PSA 3.2 02/08/2020   INR 0.95 08/17/2010   HGBA1C 8.0 (H) 05/08/2020   MICROALBUR 0.5 02/08/2020    VAS [place] LOWER EXTREMITY VENOUS (DVT)  Result Date: 07/23/2018  Lower Venous DVT Study Indications: Edema, bilaterally. Denies SOB.  Risk Factors: None identified. Performing Technologist: Sharlett Iles RVT  Examination Guidelines: A complete evaluation includes B-mode imaging, spectral Doppler, color Doppler, and power Doppler as needed of all accessible portions of each vessel. Bilateral testing is considered an integral part of a complete examination. Limited examinations for reoccurring indications may be performed as noted. The reflux portion of the exam is performed with the patient in reverse Trendelenburg.  Right Venous Findings: +---------+---------------+---------+-----------+----------+-------+          CompressibilityPhasicitySpontaneityPropertiesSummary +---------+---------------+---------+-----------+----------+-------+ CFV      Full           Yes      Yes                          +---------+---------------+---------+-----------+----------+-------+ SFJ      Full           Yes      Yes                          +---------+---------------+---------+-----------+----------+-------+ FV Prox  Full           Yes      Yes                          +---------+---------------+---------+-----------+----------+-------+ FV Mid   Full                                                  +---------+---------------+---------+-----------+----------+-------+ FV DistalFull           Yes      Yes                          +---------+---------------+---------+-----------+----------+-------+ PFV      Full           Yes      Yes                          +---------+---------------+---------+-----------+----------+-------+ POP      Full           Yes      Yes                          +---------+---------------+---------+-----------+----------+-------+ PTV      Full           Yes      No                           +---------+---------------+---------+-----------+----------+-------+ PERO     Full           Yes      No                           +---------+---------------+---------+-----------+----------+-------+ Gastroc  Full                                                 +---------+---------------+---------+-----------+----------+-------+  GSV      Full           Yes      Yes                          +---------+---------------+---------+-----------+----------+-------+  Left Venous Findings: +---------+---------------+---------+-----------+----------+-------+          CompressibilityPhasicitySpontaneityPropertiesSummary +---------+---------------+---------+-----------+----------+-------+ CFV      Full           Yes      Yes                          +---------+---------------+---------+-----------+----------+-------+ SFJ      Full           Yes      Yes                          +---------+---------------+---------+-----------+----------+-------+ FV Prox  Full           Yes      Yes                          +---------+---------------+---------+-----------+----------+-------+ FV Mid   Full                                                 +---------+---------------+---------+-----------+----------+-------+ FV DistalFull           Yes      Yes                           +---------+---------------+---------+-----------+----------+-------+ PFV      Full           Yes      Yes                          +---------+---------------+---------+-----------+----------+-------+ POP      Full           Yes      Yes                          +---------+---------------+---------+-----------+----------+-------+ PTV      Full           Yes      No                           +---------+---------------+---------+-----------+----------+-------+ PERO     Full           Yes      No                           +---------+---------------+---------+-----------+----------+-------+ Gastroc  Full                                                 +---------+---------------+---------+-----------+----------+-------+ GSV      Full           Yes      Yes                          +---------+---------------+---------+-----------+----------+-------+  Final Interpretation: Right: No evidence of deep vein thrombosis in the lower extremity. No indirect evidence of obstruction proximal to the inguinal ligament. Left: No evidence of deep vein thrombosis in the lower extremity. No indirect evidence of obstruction proximal to the inguinal ligament.  *See table(s) above for measurements and observations. Electronically signed by Ida Rogue MD on 07/23/2018 at 7:37:05 PM.    Final     AUGUST 2017 IMPRESSION: 1. No reversible ischemia or infarction.  2. Normal left ventricular wall motion.  3. Left ventricular ejection fraction 56%  4. Non invasive risk stratification*: Low  *2012 Appropriate Use Criteria for Coronary Revascularization Focused Update: J Am Coll Cardiol. 4536;46(8):032-122. http://content.[email or web address].aspx?articleid=[email or web address]   Assessment & Plan:   [name] was seen today for anemia, coronary artery disease, congestive heart failure, hypertension and diabetes.  Diagnoses and all orders for this visit:  Vitamin B12 deficiency anemia due to  intrinsic factor deficiency- His vitamin levels are normal but he is still anemic.  This is likely the anemia of chronic disease. -     Vitamin B12; Future -     Folate; Future -     Folate -     Vitamin B12  Hyperkalemia- His potassium level is down to 5.0.  His EKG is negative for changes related to hyperkalemia.  I have asked him to avoid ARB's for now. -     Basic metabolic panel; Future -     EKG 12-Lead -     Basic metabolic panel  Essential hypertension- His blood pressure is adequately well controlled.  Anemia, chronic disease- See above. -     IBC panel; Future -     Ferritin; Future -     Ferritin -     IBC panel  Atherosclerosis of native coronary artery of native heart without angina pectoris- His troponin is negative.  His EKG is unchanged. I have asked him to undergo a myocardial perfusion imaging to see if he is experiencing ischemia. -     EKG 12-Lead -     Troponin I (High Sensitivity); Future -     Troponin I (High Sensitivity) -     MYOCARDIAL PERFUSION IMAGING; Future  Chronic combined systolic and diastolic congestive heart failure ([place])- He has a normal volume status on exam but complains of DOE.  His BNP is normal.  I recommended that he undergo a myocardial perfusion imaging to screen for ischemia and to get a gauge of his ejection fraction.  -     EKG 12-Lead -     Brain natriuretic peptide; Future -     Brain natriuretic peptide -     MYOCARDIAL PERFUSION IMAGING; Future  Other orders -     Insulin Pen Needle (PEN NEEDLES) 32G X 4 MM MISC; Use as directed to inject insulin daily. DX: E11.9   I am having [name] start on Pen Needles. I am also having him maintain his glucose blood, aspirin, nitroGLYCERIN, fluticasone, Accu-Chek Guide Me, Vitamin D3, levocetirizine, gabapentin, traMADol, pantoprazole, carvedilol, ranolazine, omega-3 acid ethyl esters, Farxiga, Nexlizet, atorvastatin, FreeStyle Libre Sensor System, tamsulosin, isosorbide mononitrate,  indapamide, and Soliqua.  Meds ordered this encounter  Medications  . Insulin Pen Needle (PEN NEEDLES) 32G X 4 MM MISC    Sig: Use as directed to inject insulin daily. DX: E11.9    Dispense:  100 each    Refill:  3   I spent 60 minutes in preparing to see the patient by review of recent  labs, imaging and procedures, obtaining and reviewing separately obtained history, communicating with the patient and family or caregiver, ordering medications, tests or procedures, and documenting clinical information in the EHR including the differential Dx, treatment, and any further evaluation and other management of 1. Vitamin B12 deficiency anemia due to intrinsic factor deficiency 2. Hyperkalemia 3. Essential hypertension 4. Anemia, chronic disease 5. Atherosclerosis of native coronary artery of native heart without angina pectoris 6. Chronic combined systolic and diastolic congestive heart failure ([place])     Follow-up: Return in about 3 weeks (around 05/31/2020).  [name], MD

## 2020-05-10 NOTE — Patient Instructions (Signed)
Hyperkalemia Hyperkalemia occurs when the level of potassium in your blood is too high. Potassium is an important nutrient that helps the muscles and nerves function normally. It affects how the heart works, and it helps keep fluids and minerals balanced in the body. If there is too much potassium in your blood, it can affect your heart's ability to function normally. Potassium is normally removed (excreted) from the body by the kidneys. Hyperkalemia can result from various conditions. It can range from mild to severe. What are the causes? This condition may be caused by:  Taking in too much potassium. You can do this by: ? Using salt substitutes. They contain large amounts of potassium. ? Taking potassium supplements. ? Eating foods that are high in potassium.  Excreting too little potassium. This can happen if: ? Your kidneys are not working properly. Kidney (renal) disease, including short-term or long-term renal failure, is a common cause of hyperkalemia. ? You are taking medicines that lower your excretion of potassium. ? You have Addison's disease. ? You have a urinary tract blockage, such as kidney stones. ? You are on treatment to mechanically clean your blood (dialysis) and you skip a treatment.  Releasing a high amount of potassium from your cells into your blood. This can happen with: ? Injury to muscles (rhabdomyolysis) or other tissues. Most potassium is stored in your muscles. ? Severe burns or infections. ? Acidic blood plasma (acidosis). Acidosis can result from many diseases, such as uncontrolled diabetes. What increases the risk? The following factors may make you more likely to develop this condition:  Kidney disease. This puts you at the highest risk.  Addison's disease. This is a condition where the adrenal glands do not produce enough hormones.  Alcoholism or heavy drug use.  Using certain blood pressure medicines, such as ACE inhibitors, angiotensin II receptor  blockers (ARBs), or potassium-sparing diuretics such as spironolactone.  Severe injury or burn. What are the signs or symptoms? In many cases, there are no symptoms. However, when your potassium level becomes high enough, you may have symptoms such as:  An irregular or very slow heartbeat.  Nausea.  Tiredness (fatigue).  Confusion.  Tingling of your skin or numbness of your hands or feet.  Muscle cramps.  Muscle weakness.  Not being able to move (paralysis). How is this diagnosed? This condition may be diagnosed based on:  Your symptoms and medical history. Your health care provider will ask about your use of prescription and non-prescription drugs.  A physical exam.  Blood tests.  An electrocardiogram (ECG). How is this treated? Treatment depends on the cause and severity of your condition. Treatment may need to be done in the hospital setting. Treatment may include:  IV glucose (sugar) along with insulin to shift potassium out of your blood and into your cells.  A medicine called albuterol to shift potassium out of your blood and into your cells.  Medicines to remove the potassium from your body.  Dialysis to remove the potassium from your body.  Calcium to protect your heart from the effects of high potassium, such as irregular rhythms (arrhythmias). Follow these instructions at home:   Take over-the-counter and prescription medicines only as told by your health care provider.  Do not take any supplements, natural products, herbs, or vitamins without reviewing them with your health care provider. Certain supplements and natural food products contain high amounts of potassium.  Limit your alcohol intake as told by your health care provider.  Do not use   drugs. If you need help quitting, ask your health care provider.  If you have kidney disease, you may need to follow a low-potassium diet. A dietitian can help you learn which foods have high or low amounts of  potassium.  Keep all follow-up visits as told by your health care provider. This is important. Contact a health care provider if you:  Have an irregular or very slow heartbeat.  Feel light-headed.  Feel weak.  Are nauseous.  Have tingling or numbness in your hands or feet. Get help right away if you:  Have shortness of breath.  Have chest pain or discomfort.  Pass out.  Have muscle paralysis. Summary  Hyperkalemia occurs when the level of potassium in your blood is too high.  This condition may be caused by taking in too much potassium, excreting too little potassium, or releasing a high amount of potassium from your cells into your blood.  Hyperkalemia can result from many underlying conditions, especially chronic kidney disease, or from taking certain medicines.  Treatment of hyperkalemia may include medicine to shift potassium out of your blood and into your cells or to remove the potassium from your body.  If you have kidney disease, you may need to follow a low-potassium diet. A dietitian can help you learn which foods have high or low amounts of potassium. This information is not intended to replace advice given to you by your health care provider. Make sure you discuss any questions you have with your health care provider. Document Revised: 11/23/2017 Document Reviewed: 11/23/2017 Elsevier Patient Education  2020 [employer]  

## 2020-05-11 ENCOUNTER — Ambulatory Visit: Payer: PPO | Admitting: Podiatry

## 2020-05-12 ENCOUNTER — Other Ambulatory Visit: Payer: Self-pay | Admitting: Internal Medicine

## 2020-05-12 DIAGNOSIS — E118 Type 2 diabetes mellitus with unspecified complications: Secondary | ICD-10-CM

## 2020-05-15 ENCOUNTER — Telehealth (HOSPITAL_COMMUNITY): Payer: Self-pay

## 2020-05-15 ENCOUNTER — Other Ambulatory Visit: Payer: Self-pay

## 2020-05-15 ENCOUNTER — Ambulatory Visit (INDEPENDENT_AMBULATORY_CARE_PROVIDER_SITE_OTHER): Payer: PPO | Admitting: Podiatry

## 2020-05-15 ENCOUNTER — Encounter: Payer: Self-pay | Admitting: Podiatry

## 2020-05-15 ENCOUNTER — Encounter (HOSPITAL_COMMUNITY): Payer: Self-pay

## 2020-05-15 DIAGNOSIS — M79609 Pain in unspecified limb: Secondary | ICD-10-CM | POA: Diagnosis not present

## 2020-05-15 DIAGNOSIS — N183 Chronic kidney disease, stage 3 unspecified: Secondary | ICD-10-CM | POA: Diagnosis not present

## 2020-05-15 DIAGNOSIS — B351 Tinea unguium: Secondary | ICD-10-CM

## 2020-05-15 DIAGNOSIS — E0822 Diabetes mellitus due to underlying condition with diabetic chronic kidney disease: Secondary | ICD-10-CM

## 2020-05-15 DIAGNOSIS — Z794 Long term (current) use of insulin: Secondary | ICD-10-CM

## 2020-05-15 NOTE — Telephone Encounter (Signed)
Detailed instructions left on the patient's answering machine, asked to call back with any questions. My chart letter letter sent also. S.Auburn Hester EMTP

## 2020-05-15 NOTE — Patient Instructions (Signed)
Diabetes Mellitus and Foot Care Foot care is an important part of your health, especially when you have diabetes. Diabetes may cause you to have problems because of poor blood flow (circulation) to your feet and legs, which can cause your skin to:  Become thinner and drier.  Break more easily.  Heal more slowly.  Peel and crack. You may also have nerve damage (neuropathy) in your legs and feet, causing decreased feeling in them. This means that you may not notice minor injuries to your feet that could lead to more serious problems. Noticing and addressing any potential problems early is the best way to prevent future foot problems. How to care for your feet Foot hygiene  Wash your feet daily with warm water and mild soap. Do not use hot water. Then, pat your feet and the areas between your toes until they are completely dry. Do not soak your feet as this can dry your skin.  Trim your toenails straight across. Do not dig under them or around the cuticle. File the edges of your nails with an emery board or nail file.  Apply a moisturizing lotion or petroleum jelly to the skin on your feet and to dry, brittle toenails. Use lotion that does not contain alcohol and is unscented. Do not apply lotion between your toes. Shoes and socks  Wear clean socks or stockings every day. Make sure they are not too tight. Do not wear knee-high stockings since they may decrease blood flow to your legs.  Wear shoes that fit properly and have enough cushioning. Always look in your shoes before you put them on to be sure there are no objects inside.  To break in new shoes, wear them for just a few hours a day. This prevents injuries on your feet. Wounds, scrapes, corns, and calluses  Check your feet daily for blisters, cuts, bruises, sores, and redness. If you cannot see the bottom of your feet, use a mirror or ask someone for help.  Do not cut corns or calluses or try to remove them with medicine.  If you  find a minor scrape, cut, or break in the skin on your feet, keep it and the skin around it clean and dry. You may clean these areas with mild soap and water. Do not clean the area with peroxide, alcohol, or iodine.  If you have a wound, scrape, corn, or callus on your foot, look at it several times a day to make sure it is healing and not infected. Check for: ? Redness, swelling, or pain. ? Fluid or blood. ? Warmth. ? Pus or a bad smell. General instructions  Do not cross your legs. This may decrease blood flow to your feet.  Do not use heating pads or hot water bottles on your feet. They may burn your skin. If you have lost feeling in your feet or legs, you may not know this is happening until it is too late.  Protect your feet from hot and cold by wearing shoes, such as at the beach or on hot pavement.  Schedule a complete foot exam at least once a year (annually) or more often if you have foot problems. If you have foot problems, report any cuts, sores, or bruises to your health care provider immediately. Contact a health care provider if:  You have a medical condition that increases your risk of infection and you have any cuts, sores, or bruises on your feet.  You have an injury that is not   healing.  You have redness on your legs or feet.  You feel burning or tingling in your legs or feet.  You have pain or cramps in your legs and feet.  Your legs or feet are numb.  Your feet always feel cold.  You have pain around a toenail. Get help right away if:  You have a wound, scrape, corn, or callus on your foot and: ? You have pain, swelling, or redness that gets worse. ? You have fluid or blood coming from the wound, scrape, corn, or callus. ? Your wound, scrape, corn, or callus feels warm to the touch. ? You have pus or a bad smell coming from the wound, scrape, corn, or callus. ? You have a fever. ? You have a red line going up your leg. Summary  Check your feet every day  for cuts, sores, red spots, swelling, and blisters.  Moisturize feet and legs daily.  Wear shoes that fit properly and have enough cushioning.  If you have foot problems, report any cuts, sores, or bruises to your health care provider immediately.  Schedule a complete foot exam at least once a year (annually) or more often if you have foot problems. This information is not intended to replace advice given to you by your health care provider. Make sure you discuss any questions you have with your health care provider. Document Revised: 08/31/2019 Document Reviewed: 01/09/2017 Elsevier Patient Education  2020 Elsevier Inc.  

## 2020-05-16 ENCOUNTER — Telehealth: Payer: Self-pay | Admitting: Internal Medicine

## 2020-05-16 NOTE — Telephone Encounter (Signed)
New message:    [name] is calling with [employer]. She states [name] asked her to reach out to [place] if she has been unable to get with the pt about Case Management. She states she has been unsuccessful. Please advise.

## 2020-05-17 ENCOUNTER — Ambulatory Visit (HOSPITAL_COMMUNITY): Payer: PPO | Attending: Cardiology

## 2020-05-17 ENCOUNTER — Other Ambulatory Visit: Payer: Self-pay

## 2020-05-17 DIAGNOSIS — I2510 Atherosclerotic heart disease of native coronary artery without angina pectoris: Secondary | ICD-10-CM | POA: Diagnosis not present

## 2020-05-17 DIAGNOSIS — I5042 Chronic combined systolic (congestive) and diastolic (congestive) heart failure: Secondary | ICD-10-CM | POA: Insufficient documentation

## 2020-05-17 LAB — MYOCARDIAL PERFUSION IMAGING
LV dias vol: 85 mL (ref 62–150)
LV sys vol: 38 mL
Peak HR: 86 {beats}/min
Rest HR: 61 {beats}/min
SDS: 0
SRS: 0
SSS: 0
TID: 1.26

## 2020-05-17 MED ORDER — REGADENOSON 0.4 MG/5ML IV SOLN
0.4000 mg | Freq: Once | INTRAVENOUS | Status: AC
  Administered 2020-05-17: 0.4 mg via INTRAVENOUS

## 2020-05-17 MED ORDER — TECHNETIUM TC 99M TETROFOSMIN IV KIT
10.1000 | PACK | Freq: Once | INTRAVENOUS | Status: AC | PRN
  Administered 2020-05-17: 10.1 via INTRAVENOUS
  Filled 2020-05-17: qty 11

## 2020-05-17 MED ORDER — TECHNETIUM TC 99M TETROFOSMIN IV KIT
32.3000 | PACK | Freq: Once | INTRAVENOUS | Status: AC | PRN
  Administered 2020-05-17: 32.3 via INTRAVENOUS
  Filled 2020-05-17: qty 33

## 2020-05-21 NOTE — Progress Notes (Signed)
Subjective: [name] presents today at risk foot care. Pt has h/o NIDDM with chronic kidney disease and painful mycotic nails b/l that are difficult to trim. Pain interferes with ambulation. Aggravating factors include wearing enclosed shoe gear. Pain is relieved with periodic professional debridement.  He voices no new pedal concerns on today's visit.  Elijah Lima, MD is patient's PCP. Last visit was: 05/10/2020.  Past Medical History:  Diagnosis Date  . Arthritis    "mild arthritis in hip"  . CAD (coronary artery disease)   . CHF (congestive heart failure) ([place])   . Complication of anesthesia    "lungs filled up with fluid"   . Diabetes mellitus    Type II  . Glaucoma   . Heart murmur   . HTN (hypertension)   . Hypercholesterolemia   . LBP (low back pain)   . Legally blind   . Peripheral neuropathy   . Sleep apnea   . Staph infection    "from back surgery"  . Trigger finger   . Ulcer   . Wears glasses    "to protect cornea" - legally blind     Current Outpatient Medications on File Prior to Visit  Medication Sig Dispense Refill  . aspirin 81 MG EC tablet Take 1 tablet (81 mg total) by mouth daily. Swallow whole. 90 tablet 1  . atorvastatin (LIPITOR) 80 MG tablet Take 1 tablet (80 mg total) by mouth daily at 6 PM. 90 tablet 1  . Bempedoic Acid-Ezetimibe (NEXLIZET) 180-10 MG TABS Take 1 tablet by mouth daily. 90 tablet 1  . Blood Glucose Monitoring Suppl (ACCU-CHEK GUIDE ME) w/Device KIT 1 Act by Does not apply route 3 (three) times daily with meals. 2 kit 0  . carvedilol (COREG) 12.5 MG tablet TAKE 1 TABLET BY MOUTH TWICE A DAY WITH A MEAL 180 tablet 3  . Cholecalciferol (VITAMIN D3) 25 MCG (1000 UT) CAPS Take by mouth.    . Continuous Blood Gluc Sensor (FREESTYLE LIBRE SENSOR SYSTEM) MISC Use 1 sensor every 14 days, then replace with new one. 6 each 3  . dapagliflozin propanediol (FARXIGA) 10 MG TABS tablet Take 10 mg by mouth daily. 90 tablet 1  . fluticasone  (FLONASE) 50 MCG/ACT nasal spray SPRAY 2 SPRAYS INTO EACH NOSTRIL EVERY DAY 48 mL 3  . gabapentin (NEURONTIN) 300 MG capsule TAKE 1 CAPSULE BY MOUTH THREE TIMES A DAY 270 capsule 2  . glucose blood (ONETOUCH VERIO) test strip Use BID to check BS. DX E11.9 100 each 12  . indapamide (LOZOL) 1.25 MG tablet TAKE 1 TABLET (1.25 MG TOTAL) BY MOUTH DAILY. 90 tablet 0  . Insulin Glargine-Lixisenatide (SOLIQUA) 100-33 UNT-MCG/ML SOPN Inject 20 Units into the skin daily. 9 mL 1  . Insulin Pen Needle (PEN NEEDLES) 32G X 4 MM MISC Use as directed to inject insulin daily. DX: E11.9 100 each 3  . isosorbide mononitrate (IMDUR) 120 MG 24 hr tablet TAKE 1 TABLET BY MOUTH EVERY DAY 90 tablet 2  . levocetirizine (XYZAL) 5 MG tablet TAKE 1 TABLET BY MOUTH EVERY DAY IN THE EVENING 90 tablet 3  . metFORMIN (GLUCOPHAGE) 500 MG tablet TAKE 1 TABLET (500 MG TOTAL) BY MOUTH 2 (TWO) TIMES DAILY WITH A MEAL. 180 tablet 1  . nitroGLYCERIN (NITROSTAT) 0.4 MG SL tablet Place 1 tablet (0.4 mg total) under the tongue every 5 (five) minutes as needed for chest pain. 25 tablet 3  . omega-3 acid ethyl esters (LOVAZA) 1 g capsule  TAKE 2 CAPSULES BY MOUTH 2 TIMES DAILY 360 capsule 1  . pantoprazole (PROTONIX) 40 MG tablet Take 1 tablet (40 mg total) by mouth 2 (two) times daily. 180 tablet 3  . ranolazine (RANEXA) 500 MG 12 hr tablet TAKE 1 TABLET(500 MG) BY MOUTH TWICE DAILY. 60 tablet 11  . tamsulosin (FLOMAX) 0.4 MG CAPS capsule Take 0.4 mg by mouth once.    . traMADol (ULTRAM) 50 MG tablet TAKE 1 TABLET (50 MG TOTAL) BY MOUTH EVERY 8 (EIGHT) HOURS AS NEEDED. 270 tablet 1  . [DISCONTINUED] indapamide (LOZOL) 1.25 MG tablet Take 1 tablet (1.25 mg total) by mouth daily. 90 tablet 0  . [DISCONTINUED] metFORMIN (GLUCOPHAGE) 500 MG tablet Take 1 tablet (500 mg total) by mouth 2 (two) times daily with a meal. 180 tablet 1  . [DISCONTINUED] olmesartan (BENICAR) 20 MG tablet TAKE 1 TABLET BY MOUTH EVERY DAY 90 tablet 0   No current  facility-administered medications on file prior to visit.     Allergies  Allergen Reactions  . Furosemide     pancreatitis  . Olmesartan Other (See Comments)    hyperkalemia  . Amlodipine Swelling    LE EDEMA  . Diamox [Acetazolamide] Itching    hives  . Lisinopril Rash    Objective: [name] is a pleasant 69 y.o. y.o. Patient Race: Black or African American [2]  male in NAD. AAO x 3.  There were no vitals filed for this visit.  Vascular Examination: Neurovascular status unchanged b/l LE.. Capillary fill time to digits <3 seconds b/l LE. Palpable DP pulses b/l. Palpable PT pulses b/l. Pedal hair absent b/l Skin temperature gradient within normal limits b/l. No pain with calf compression b/l.  Dermatological Examination: Pedal skin with normal turgor, texture and tone bilaterally. No open wounds bilaterally. No interdigital macerations bilaterally. Toenails 1-5 b/l elongated, discolored, dystrophic, thickened, crumbly with subungual debris and tenderness to dorsal palpation.  Musculoskeletal: Normal muscle strength 5/5 to all lower extremity muscle groups of LLE.  No pain crepitus or joint limitation noted with ROM b/l. No gross bony deformities bilaterally.  Neurological Examination: Pt has subjective symptoms of neuropathy. Protective sensation intact 5/5 intact bilaterally with 10g monofilament b/l. Vibratory sensation intact b/l. Proprioception intact bilaterally. Dropfoot noted right lower extremity. Babinski reflex negative b/l. Clonus negative b/l.  Assessment: 1. Pain due to onychomycosis of nail   2. Diabetes mellitus due to underlying condition with stage 3 chronic kidney disease, with long-term current use of insulin, unspecified whether stage 3a or 3b CKD ([place])    Plan: -Examined patient. -No new findings. No new orders. -Continue diabetic foot care principles. Literature dispensed on today.  -Toenails 1-5 b/l were debrided in length and girth with sterile  nail nippers and dremel without iatrogenic bleeding.  -Patient to continue soft, supportive shoe gear daily. -Patient to report any pedal injuries to medical professional immediately. -Patient/POA to call should there be question/concern in the interim.  Return in about 3 months (around 08/15/2020) for diabetic nail and callus trim.  Marzetta Board, DPM

## 2020-05-29 ENCOUNTER — Other Ambulatory Visit: Payer: Self-pay | Admitting: Internal Medicine

## 2020-05-29 DIAGNOSIS — E0849 Diabetes mellitus due to underlying condition with other diabetic neurological complication: Secondary | ICD-10-CM

## 2020-05-29 DIAGNOSIS — M5416 Radiculopathy, lumbar region: Secondary | ICD-10-CM

## 2020-05-29 DIAGNOSIS — M961 Postlaminectomy syndrome, not elsewhere classified: Secondary | ICD-10-CM

## 2020-05-31 ENCOUNTER — Ambulatory Visit (INDEPENDENT_AMBULATORY_CARE_PROVIDER_SITE_OTHER): Payer: PPO | Admitting: Internal Medicine

## 2020-05-31 ENCOUNTER — Other Ambulatory Visit: Payer: Self-pay

## 2020-05-31 ENCOUNTER — Encounter: Payer: Self-pay | Admitting: Internal Medicine

## 2020-05-31 VITALS — BP 140/60 | HR 76 | Temp 98.6°F | Resp 16 | Ht 70.0 in | Wt 235.0 lb

## 2020-05-31 DIAGNOSIS — E118 Type 2 diabetes mellitus with unspecified complications: Secondary | ICD-10-CM

## 2020-05-31 DIAGNOSIS — N1832 Chronic kidney disease, stage 3b: Secondary | ICD-10-CM

## 2020-05-31 DIAGNOSIS — E875 Hyperkalemia: Secondary | ICD-10-CM | POA: Diagnosis not present

## 2020-05-31 DIAGNOSIS — I5042 Chronic combined systolic (congestive) and diastolic (congestive) heart failure: Secondary | ICD-10-CM | POA: Diagnosis not present

## 2020-05-31 DIAGNOSIS — I10 Essential (primary) hypertension: Secondary | ICD-10-CM

## 2020-05-31 LAB — BASIC METABOLIC PANEL
BUN: 24 mg/dL — ABNORMAL HIGH (ref 6–23)
CO2: 25 mEq/L (ref 19–32)
Calcium: 8.8 mg/dL (ref 8.4–10.5)
Chloride: 102 mEq/L (ref 96–112)
Creatinine, Ser: 1.54 mg/dL — ABNORMAL HIGH (ref 0.40–1.50)
GFR: 54.19 mL/min — ABNORMAL LOW (ref >60.00)
Glucose, Bld: 130 mg/dL — ABNORMAL HIGH (ref 70–99)
Potassium: 4.4 mEq/L (ref 3.5–5.1)
Sodium: 137 mEq/L (ref 135–145)

## 2020-05-31 NOTE — Progress Notes (Signed)
Subjective:  Patient ID: [name], male    DOB: August 13, 1951  Age: 69 y.o. MRN: [id number]  CC: Hypertension and Diabetes  This visit occurred during the SARS-CoV-2 public health emergency.  Safety protocols were in place, including screening questions prior to the visit, additional usage of staff PPE, and extensive cleaning of exam room while observing appropriate contact time as indicated for disinfecting solutions.    HPI [name] presents for f/up - He is in his usual state of health today.  He returns to have his potassium level rechecked.  He recently experienced hyperkalemia in the setting of taking 2 different ARBs.  He is not currently taking an ARB.  He also recently underwent a myocardial perfusion imaging which was negative for ischemia.  Outpatient Medications Prior to Visit  Medication Sig Dispense Refill  . aspirin 81 MG EC tablet Take 1 tablet (81 mg total) by mouth daily. Swallow whole. 90 tablet 1  . atorvastatin (LIPITOR) 80 MG tablet Take 1 tablet (80 mg total) by mouth daily at 6 PM. 90 tablet 1  . Bempedoic Acid-Ezetimibe (NEXLIZET) 180-10 MG TABS Take 1 tablet by mouth daily. 90 tablet 1  . Blood Glucose Monitoring Suppl (ACCU-CHEK GUIDE ME) w/Device KIT 1 Act by Does not apply route 3 (three) times daily with meals. 2 kit 0  . carvedilol (COREG) 12.5 MG tablet TAKE 1 TABLET BY MOUTH TWICE A DAY WITH A MEAL 180 tablet 3  . Cholecalciferol (VITAMIN D3) 25 MCG (1000 UT) CAPS Take by mouth.    . Continuous Blood Gluc Sensor (FREESTYLE LIBRE SENSOR SYSTEM) MISC Use 1 sensor every 14 days, then replace with new one. 6 each 3  . dapagliflozin propanediol (FARXIGA) 10 MG TABS tablet Take 10 mg by mouth daily. 90 tablet 1  . fluticasone (FLONASE) 50 MCG/ACT nasal spray SPRAY 2 SPRAYS INTO EACH NOSTRIL EVERY DAY 48 mL 3  . gabapentin (NEURONTIN) 300 MG capsule TAKE 1 CAPSULE BY MOUTH THREE TIMES A DAY 270 capsule 2  . glucose blood (ONETOUCH VERIO) test strip Use BID to  check BS. DX E11.9 100 each 12  . indapamide (LOZOL) 1.25 MG tablet TAKE 1 TABLET (1.25 MG TOTAL) BY MOUTH DAILY. 90 tablet 0  . Insulin Glargine-Lixisenatide (SOLIQUA) 100-33 UNT-MCG/ML SOPN Inject 20 Units into the skin daily. 9 mL 1  . Insulin Pen Needle (PEN NEEDLES) 32G X 4 MM MISC Use as directed to inject insulin daily. DX: E11.9 100 each 3  . isosorbide mononitrate (IMDUR) 120 MG 24 hr tablet TAKE 1 TABLET BY MOUTH EVERY DAY 90 tablet 2  . levocetirizine (XYZAL) 5 MG tablet TAKE 1 TABLET BY MOUTH EVERY DAY IN THE EVENING 90 tablet 3  . metFORMIN (GLUCOPHAGE) 500 MG tablet TAKE 1 TABLET (500 MG TOTAL) BY MOUTH 2 (TWO) TIMES DAILY WITH A MEAL. 180 tablet 1  . omega-3 acid ethyl esters (LOVAZA) 1 g capsule TAKE 2 CAPSULES BY MOUTH 2 TIMES DAILY 360 capsule 1  . pantoprazole (PROTONIX) 40 MG tablet Take 1 tablet (40 mg total) by mouth 2 (two) times daily. 180 tablet 3  . ranolazine (RANEXA) 500 MG 12 hr tablet TAKE 1 TABLET(500 MG) BY MOUTH TWICE DAILY. 60 tablet 11  . tamsulosin (FLOMAX) 0.4 MG CAPS capsule Take 0.4 mg by mouth once.    . traMADol (ULTRAM) 50 MG tablet TAKE 1 TABLET (50 MG TOTAL) BY MOUTH EVERY 8 (EIGHT) HOURS AS NEEDED. 270 tablet 0  . nitroGLYCERIN (NITROSTAT)  0.4 MG SL tablet Place 1 tablet (0.4 mg total) under the tongue every 5 (five) minutes as needed for chest pain. 25 tablet 3   No facility-administered medications prior to visit.    ROS Review of Systems  Constitutional: Negative for diaphoresis, fatigue and unexpected weight change.  HENT: Negative.   Eyes: Negative.   Respiratory: Negative for cough, chest tightness, shortness of breath and wheezing.   Cardiovascular: Negative for chest pain, palpitations and leg swelling.  Gastrointestinal: Negative for abdominal pain, constipation, diarrhea, nausea and vomiting.  Endocrine: Negative.   Genitourinary: Negative.  Negative for difficulty urinating.  Musculoskeletal: Negative for arthralgias and myalgias.   Skin: Negative for color change.  Neurological: Negative for dizziness, weakness and light-headedness.  Hematological: Negative for adenopathy. Does not bruise/bleed easily.  Psychiatric/Behavioral: Negative.     Objective:  BP 140/60 (BP Location: Left Arm, Patient Position: Sitting, Cuff Size: Large)   Pulse 76   Temp 98.6 F (37 C) (Oral)   Resp 16   Ht _0  (1.778 m)   Wt 235 lb (106.6 kg)   SpO2 98%   BMI 33.72 kg/m   BP Readings from Last 3 Encounters:  05/31/20 140/60  05/10/20 136/72  05/08/20 (!) 124/50    Wt Readings from Last 3 Encounters:  05/31/20 235 lb (106.6 kg)  05/17/20 243 lb (110.2 kg)  05/08/20 243 lb (110.2 kg)    Physical Exam Vitals reviewed.  Constitutional:      Appearance: He is obese.  HENT:     Nose: Nose normal.     Mouth/Throat:     Mouth: Mucous membranes are moist.  Eyes:     General: No scleral icterus.    Conjunctiva/sclera: Conjunctivae normal.  Cardiovascular:     Rate and Rhythm: Normal rate and regular rhythm.     Heart sounds: No murmur heard.   Pulmonary:     Effort: Pulmonary effort is normal.     Breath sounds: No stridor. No wheezing, rhonchi or rales.  Abdominal:     General: Abdomen is protuberant. Bowel sounds are normal. There is no distension.     Palpations: Abdomen is soft. There is no hepatomegaly, splenomegaly or mass.     Tenderness: There is no abdominal tenderness.     Hernia: No hernia is present.  Musculoskeletal:        General: Normal range of motion.     Cervical back: Neck supple.     Right lower leg: No edema.     Left lower leg: No edema.  Lymphadenopathy:     Cervical: No cervical adenopathy.  Skin:    General: Skin is warm and dry.     Findings: No rash.  Neurological:     General: No focal deficit present.     Mental Status: He is alert.  Psychiatric:        Mood and Affect: Mood normal.        Behavior: Behavior normal.     Lab Results  Component Value Date   WBC 6.4  05/08/2020   HGB 10.5 (L) 05/08/2020   HCT 33.2 (L) 05/08/2020   PLT 224.0 05/08/2020   GLUCOSE 130 (H) 05/31/2020   CHOL 93 05/08/2020   TRIG 158.0 (H) 05/08/2020   HDL 22.10 (L) 05/08/2020   LDLDIRECT 82.7 04/04/2013   LDLCALC 40 05/08/2020   ALT 13 02/24/2019   AST 13 02/24/2019   NA 137 05/31/2020   K 4.4 05/31/2020   CL 102 05/31/2020  CREATININE 1.54 (H) 05/31/2020   BUN 24 (H) 05/31/2020   CO2 25 05/31/2020   TSH 1.15 02/24/2019   PSA 3.2 02/08/2020   INR 0.95 08/17/2010   HGBA1C 8.0 (H) 05/08/2020   MICROALBUR 0.5 02/08/2020    VAS [place] LOWER EXTREMITY VENOUS (DVT)  Result Date: 07/23/2018  Lower Venous DVT Study Indications: Edema, bilaterally. Denies SOB.  Risk Factors: None identified. Performing Technologist: Sharlett Iles RVT  Examination Guidelines: A complete evaluation includes B-mode imaging, spectral Doppler, color Doppler, and power Doppler as needed of all accessible portions of each vessel. Bilateral testing is considered an integral part of a complete examination. Limited examinations for reoccurring indications may be performed as noted. The reflux portion of the exam is performed with the patient in reverse Trendelenburg.  Right Venous Findings: +---------+---------------+---------+-----------+----------+-------+          CompressibilityPhasicitySpontaneityPropertiesSummary +---------+---------------+---------+-----------+----------+-------+ CFV      Full           Yes      Yes                          +---------+---------------+---------+-----------+----------+-------+ SFJ      Full           Yes      Yes                          +---------+---------------+---------+-----------+----------+-------+ FV Prox  Full           Yes      Yes                          +---------+---------------+---------+-----------+----------+-------+ FV Mid   Full                                                  +---------+---------------+---------+-----------+----------+-------+ FV DistalFull           Yes      Yes                          +---------+---------------+---------+-----------+----------+-------+ PFV      Full           Yes      Yes                          +---------+---------------+---------+-----------+----------+-------+ POP      Full           Yes      Yes                          +---------+---------------+---------+-----------+----------+-------+ PTV      Full           Yes      No                           +---------+---------------+---------+-----------+----------+-------+ PERO     Full           Yes      No                           +---------+---------------+---------+-----------+----------+-------+ Gastroc  Full                                                 +---------+---------------+---------+-----------+----------+-------+ GSV      Full           Yes      Yes                          +---------+---------------+---------+-----------+----------+-------+  Left Venous Findings: +---------+---------------+---------+-----------+----------+-------+          CompressibilityPhasicitySpontaneityPropertiesSummary +---------+---------------+---------+-----------+----------+-------+ CFV      Full           Yes      Yes                          +---------+---------------+---------+-----------+----------+-------+ SFJ      Full           Yes      Yes                          +---------+---------------+---------+-----------+----------+-------+ FV Prox  Full           Yes      Yes                          +---------+---------------+---------+-----------+----------+-------+ FV Mid   Full                                                 +---------+---------------+---------+-----------+----------+-------+ FV DistalFull           Yes      Yes                           +---------+---------------+---------+-----------+----------+-------+ PFV      Full           Yes      Yes                          +---------+---------------+---------+-----------+----------+-------+ POP      Full           Yes      Yes                          +---------+---------------+---------+-----------+----------+-------+ PTV      Full           Yes      No                           +---------+---------------+---------+-----------+----------+-------+ PERO     Full           Yes      No                           +---------+---------------+---------+-----------+----------+-------+ Gastroc  Full                                                 +---------+---------------+---------+-----------+----------+-------+  GSV      Full           Yes      Yes                          +---------+---------------+---------+-----------+----------+-------+    Final Interpretation: Right: No evidence of deep vein thrombosis in the lower extremity. No indirect evidence of obstruction proximal to the inguinal ligament. Left: No evidence of deep vein thrombosis in the lower extremity. No indirect evidence of obstruction proximal to the inguinal ligament.  *See table(s) above for measurements and observations. Electronically signed by Ida Rogue MD on 07/23/2018 at 7:37:05 PM.    Final     Assessment & Plan:   [place] was seen today for hypertension and diabetes.  Diagnoses and all orders for this visit:  Essential hypertension- His blood pressure is adequately well controlled.  Will avoid ARB's for now. -     Basic metabolic panel; Future -     Basic metabolic panel  Type 2 diabetes mellitus with complication ([place])- His recent A1c was elevated at 8.0%.  Measures have been taking to improve blood sugar control. -     Basic metabolic panel; Future -     Consult to [facility] -     Basic metabolic panel  Stage 3b chronic kidney disease- His renal function is stable.  He  will avoid nephrotoxic agents. -     Basic metabolic panel; Future -     Consult to [facility] Management -     Basic metabolic panel  Hyperkalemia- His potassium level is normal now.  Will hold on restarting the ARB at this time. -     Basic metabolic panel; Future -     Basic metabolic panel  Chronic combined systolic and diastolic congestive heart failure ([place])- His wife wants to know if there is a service that can help her manage him in the home.  I recommend a consultation with [facility]. -     Consult to [facility]   I am having Carloyn Manner B. Weil maintain his glucose blood, aspirin, nitroGLYCERIN, fluticasone, Accu-Chek Guide Me, Vitamin D3, levocetirizine, gabapentin, pantoprazole, carvedilol, ranolazine, omega-3 acid ethyl esters, Farxiga, Nexlizet, atorvastatin, FreeStyle Libre Sensor System, tamsulosin, isosorbide mononitrate, indapamide, Soliqua, Pen Needles, metFORMIN, and traMADol.  No orders of the defined types were placed in this encounter.    Follow-up: Return in about 4 months (around 09/30/2020).  [name], MD

## 2020-05-31 NOTE — Patient Outreach (Signed)
Received a referral from Dr. [name]. Patient has [facility] [employer]. Following their workflow I have sent this referral to the [employer]  [employer] group through email [email or web address] for Care Management

## 2020-05-31 NOTE — Patient Instructions (Signed)

## 2020-06-01 DIAGNOSIS — Z4420 Encounter for fitting and adjustment of artificial eye, unspecified: Secondary | ICD-10-CM | POA: Diagnosis not present

## 2020-06-12 ENCOUNTER — Other Ambulatory Visit: Payer: Self-pay | Admitting: Internal Medicine

## 2020-07-03 DIAGNOSIS — H05401 Unspecified enophthalmos, right eye: Secondary | ICD-10-CM | POA: Diagnosis not present

## 2020-07-03 LAB — HM DIABETES EYE EXAM

## 2020-07-06 ENCOUNTER — Encounter: Payer: Self-pay | Admitting: Internal Medicine

## 2020-07-10 DIAGNOSIS — G4733 Obstructive sleep apnea (adult) (pediatric): Secondary | ICD-10-CM | POA: Diagnosis not present

## 2020-07-18 ENCOUNTER — Other Ambulatory Visit: Payer: Self-pay | Admitting: Internal Medicine

## 2020-07-21 ENCOUNTER — Other Ambulatory Visit: Payer: Self-pay | Admitting: Internal Medicine

## 2020-07-21 DIAGNOSIS — J301 Allergic rhinitis due to pollen: Secondary | ICD-10-CM

## 2020-07-25 ENCOUNTER — Other Ambulatory Visit: Payer: Self-pay | Admitting: Internal Medicine

## 2020-07-25 DIAGNOSIS — E118 Type 2 diabetes mellitus with unspecified complications: Secondary | ICD-10-CM

## 2020-07-28 ENCOUNTER — Other Ambulatory Visit: Payer: Self-pay | Admitting: Internal Medicine

## 2020-07-28 DIAGNOSIS — E118 Type 2 diabetes mellitus with unspecified complications: Secondary | ICD-10-CM

## 2020-07-28 MED ORDER — SOLIQUA 100-33 UNT-MCG/ML ~~LOC~~ SOPN: 20.0000 [IU] | PEN_INJECTOR | Freq: Every day | SUBCUTANEOUS | 1 refills | Status: DC

## 2020-08-02 ENCOUNTER — Other Ambulatory Visit: Payer: Self-pay | Admitting: Internal Medicine

## 2020-08-02 DIAGNOSIS — I2510 Atherosclerotic heart disease of native coronary artery without angina pectoris: Secondary | ICD-10-CM

## 2020-08-02 DIAGNOSIS — E785 Hyperlipidemia, unspecified: Secondary | ICD-10-CM

## 2020-08-03 ENCOUNTER — Other Ambulatory Visit: Payer: Self-pay | Admitting: Internal Medicine

## 2020-08-03 DIAGNOSIS — I10 Essential (primary) hypertension: Secondary | ICD-10-CM

## 2020-08-03 DIAGNOSIS — I5032 Chronic diastolic (congestive) heart failure: Secondary | ICD-10-CM

## 2020-08-03 DIAGNOSIS — I5042 Chronic combined systolic (congestive) and diastolic (congestive) heart failure: Secondary | ICD-10-CM

## 2020-08-15 ENCOUNTER — Other Ambulatory Visit: Payer: Self-pay

## 2020-08-15 ENCOUNTER — Ambulatory Visit (INDEPENDENT_AMBULATORY_CARE_PROVIDER_SITE_OTHER): Payer: PPO | Admitting: Podiatry

## 2020-08-15 ENCOUNTER — Other Ambulatory Visit: Payer: Self-pay | Admitting: Internal Medicine

## 2020-08-15 ENCOUNTER — Encounter: Payer: Self-pay | Admitting: Podiatry

## 2020-08-15 DIAGNOSIS — M79609 Pain in unspecified limb: Secondary | ICD-10-CM

## 2020-08-15 DIAGNOSIS — B351 Tinea unguium: Secondary | ICD-10-CM | POA: Diagnosis not present

## 2020-08-15 DIAGNOSIS — E1142 Type 2 diabetes mellitus with diabetic polyneuropathy: Secondary | ICD-10-CM | POA: Diagnosis not present

## 2020-08-15 DIAGNOSIS — I2510 Atherosclerotic heart disease of native coronary artery without angina pectoris: Secondary | ICD-10-CM

## 2020-08-15 DIAGNOSIS — E785 Hyperlipidemia, unspecified: Secondary | ICD-10-CM

## 2020-08-15 DIAGNOSIS — M79676 Pain in unspecified toe(s): Secondary | ICD-10-CM

## 2020-08-16 DIAGNOSIS — G4733 Obstructive sleep apnea (adult) (pediatric): Secondary | ICD-10-CM | POA: Diagnosis not present

## 2020-08-19 NOTE — Progress Notes (Signed)
Subjective: [name] presents today at risk foot care. Pt has h/o NIDDM with chronic kidney disease and painful mycotic nails b/l that are difficult to trim. Pain interferes with ambulation. Aggravating factors include wearing enclosed shoe gear. Pain is relieved with periodic professional debridement.  His son is present during today's visit. He voices no new pedal concerns on today's visit.  Elijah Lima, MD is patient's PCP. Last visit was: 05/31/2020.  Past Medical History:  Diagnosis Date  . Arthritis    "mild arthritis in hip"  . CAD (coronary artery disease)   . CHF (congestive heart failure) ([place])   . Complication of anesthesia    "lungs filled up with fluid"   . Diabetes mellitus    Type II  . Glaucoma   . Heart murmur   . HTN (hypertension)   . Hypercholesterolemia   . LBP (low back pain)   . Legally blind   . Peripheral neuropathy   . Sleep apnea   . Staph infection    "from back surgery"  . Trigger finger   . Ulcer   . Wears glasses    "to protect cornea" - legally blind     Current Outpatient Medications on File Prior to Visit  Medication Sig Dispense Refill  . aspirin 81 MG EC tablet Take 1 tablet (81 mg total) by mouth daily. Swallow whole. 90 tablet 1  . atorvastatin (LIPITOR) 80 MG tablet TAKE 1 TABLET (80 MG TOTAL) BY MOUTH DAILY AT 6 PM. 90 tablet 1  . Blood Glucose Monitoring Suppl (ACCU-CHEK GUIDE ME) w/Device KIT 1 Act by Does not apply route 3 (three) times daily with meals. 2 kit 0  . carvedilol (COREG) 12.5 MG tablet TAKE 1 TABLET BY MOUTH TWICE A DAY WITH A MEAL 180 tablet 3  . Cholecalciferol (VITAMIN D3) 25 MCG (1000 UT) CAPS Take by mouth.    . Continuous Blood Gluc Sensor (FREESTYLE LIBRE SENSOR SYSTEM) MISC Use 1 sensor every 14 days, then replace with new one. 6 each 3  . dapagliflozin propanediol (FARXIGA) 10 MG TABS tablet Take 10 mg by mouth daily. 90 tablet 1  . fluticasone (FLONASE) 50 MCG/ACT nasal spray SPRAY 2 SPRAYS INTO EACH  NOSTRIL EVERY DAY 48 mL 3  . gabapentin (NEURONTIN) 300 MG capsule TAKE 1 CAPSULE BY MOUTH THREE TIMES A DAY 270 capsule 2  . glucose blood (ONETOUCH VERIO) test strip Use BID to check BS. DX E11.9 100 each 12  . indapamide (LOZOL) 1.25 MG tablet TAKE 1 TABLET (1.25 MG TOTAL) BY MOUTH DAILY. 90 tablet 0  . Insulin Glargine-Lixisenatide (SOLIQUA) 100-33 UNT-MCG/ML SOPN Inject 20 Units into the skin daily. 9 mL 1  . Insulin Pen Needle (PEN NEEDLES) 32G X 4 MM MISC Use as directed to inject insulin daily. DX: E11.9 100 each 3  . isosorbide mononitrate (IMDUR) 120 MG 24 hr tablet TAKE 1 TABLET BY MOUTH EVERY DAY 90 tablet 2  . levocetirizine (XYZAL) 5 MG tablet TAKE 1 TABLET BY MOUTH EVERY DAY IN THE EVENING 90 tablet 3  . metFORMIN (GLUCOPHAGE) 500 MG tablet TAKE 1 TABLET (500 MG TOTAL) BY MOUTH 2 (TWO) TIMES DAILY WITH A MEAL. 180 tablet 1  . nitroGLYCERIN (NITROSTAT) 0.4 MG SL tablet Place 1 tablet (0.4 mg total) under the tongue every 5 (five) minutes as needed for chest pain. 25 tablet 3  . omega-3 acid ethyl esters (LOVAZA) 1 g capsule TAKE 2 CAPSULES BY MOUTH 2 TIMES DAILY 360 capsule  1  . pantoprazole (PROTONIX) 40 MG tablet Take 1 tablet (40 mg total) by mouth 2 (two) times daily. 180 tablet 3  . ranolazine (RANEXA) 500 MG 12 hr tablet TAKE 1 TABLET BY MOUTH TWICE A DAY 180 tablet 1  . tamsulosin (FLOMAX) 0.4 MG CAPS capsule Take 0.4 mg by mouth once.    . traMADol (ULTRAM) 50 MG tablet TAKE 1 TABLET (50 MG TOTAL) BY MOUTH EVERY 8 (EIGHT) HOURS AS NEEDED. 270 tablet 0  . [DISCONTINUED] indapamide (LOZOL) 1.25 MG tablet Take 1 tablet (1.25 mg total) by mouth daily. 90 tablet 0  . [DISCONTINUED] indapamide (LOZOL) 1.25 MG tablet TAKE 1 TABLET (1.25 MG TOTAL) BY MOUTH DAILY. 90 tablet 0  . [DISCONTINUED] metFORMIN (GLUCOPHAGE) 500 MG tablet Take 1 tablet (500 mg total) by mouth 2 (two) times daily with a meal. 180 tablet 1  . [DISCONTINUED] olmesartan (BENICAR) 20 MG tablet TAKE 1 TABLET BY MOUTH  EVERY DAY 90 tablet 0  . [DISCONTINUED] omega-3 acid ethyl esters (LOVAZA) 1 g capsule TAKE 2 CAPSULES BY MOUTH 2 TIMES DAILY 360 capsule 1   No current facility-administered medications on file prior to visit.     Allergies  Allergen Reactions  . Furosemide     pancreatitis  . Olmesartan Other (See Comments)    hyperkalemia  . Amlodipine Swelling    LE EDEMA  . Diamox [Acetazolamide] Itching    hives  . Lisinopril Rash    Objective: [name] is a pleasant 69 y.o. y.o. Patient Race: Black or African American [2]  male in NAD. AAO x 3.  There were no vitals filed for this visit.  Vascular Examination: Neurovascular status unchanged b/l LE.. Capillary fill time to digits <3 seconds b/l LE. Palpable DP pulses b/l. Palpable PT pulses b/l. Pedal hair absent b/l Skin temperature gradient within normal limits b/l. No pain with calf compression b/l.  Dermatological Examination: Pedal skin with normal turgor, texture and tone bilaterally. No open wounds bilaterally. No interdigital macerations bilaterally. Toenails 1-5 b/l elongated, discolored, dystrophic, thickened, crumbly with subungual debris and tenderness to dorsal palpation.  Musculoskeletal: Normal muscle strength 5/5 to all lower extremity muscle groups of LLE.  No pain crepitus or joint limitation noted with ROM b/l. No gross bony deformities bilaterally.  Neurological Examination: Pt has subjective symptoms of neuropathy. Protective sensation intact 5/5 intact bilaterally with 10g monofilament b/l. Vibratory sensation intact b/l. Proprioception intact bilaterally. Dropfoot noted right lower extremity. Babinski reflex negative b/l. Clonus negative b/l.   Assessment: 1. Pain due to onychomycosis of nail   2. Diabetic peripheral neuropathy associated with type 2 diabetes mellitus ([place])    Plan: -Examined patient. -No new findings. No new orders. -Continue diabetic foot care principles. -Toenails 1-5 b/l were  debrided in length and girth with sterile nail nippers and dremel without iatrogenic bleeding.  -Patient to report any pedal injuries to medical professional immediately. -Patient to continue soft, supportive shoe gear daily. -Patient/POA to call should there be question/concern in the interim.  Return in about 3 months (around 11/15/2020) for diabetic nail trim.  Marzetta Board, DPM

## 2020-08-26 ENCOUNTER — Other Ambulatory Visit: Payer: Self-pay | Admitting: Internal Medicine

## 2020-08-26 DIAGNOSIS — M5416 Radiculopathy, lumbar region: Secondary | ICD-10-CM

## 2020-08-26 DIAGNOSIS — E0849 Diabetes mellitus due to underlying condition with other diabetic neurological complication: Secondary | ICD-10-CM

## 2020-08-26 DIAGNOSIS — M961 Postlaminectomy syndrome, not elsewhere classified: Secondary | ICD-10-CM

## 2020-09-03 DIAGNOSIS — R972 Elevated prostate specific antigen [PSA]: Secondary | ICD-10-CM | POA: Diagnosis not present

## 2020-09-10 DIAGNOSIS — R972 Elevated prostate specific antigen [PSA]: Secondary | ICD-10-CM | POA: Diagnosis not present

## 2020-09-10 DIAGNOSIS — R3915 Urgency of urination: Secondary | ICD-10-CM | POA: Diagnosis not present

## 2020-09-10 DIAGNOSIS — N401 Enlarged prostate with lower urinary tract symptoms: Secondary | ICD-10-CM | POA: Diagnosis not present

## 2020-09-30 ENCOUNTER — Other Ambulatory Visit: Payer: Self-pay | Admitting: Internal Medicine

## 2020-09-30 DIAGNOSIS — J301 Allergic rhinitis due to pollen: Secondary | ICD-10-CM

## 2020-10-24 ENCOUNTER — Other Ambulatory Visit: Payer: Self-pay | Admitting: Internal Medicine

## 2020-10-24 DIAGNOSIS — E0849 Diabetes mellitus due to underlying condition with other diabetic neurological complication: Secondary | ICD-10-CM

## 2020-10-24 DIAGNOSIS — M961 Postlaminectomy syndrome, not elsewhere classified: Secondary | ICD-10-CM

## 2020-10-24 DIAGNOSIS — R972 Elevated prostate specific antigen [PSA]: Secondary | ICD-10-CM | POA: Diagnosis not present

## 2020-10-24 DIAGNOSIS — M5416 Radiculopathy, lumbar region: Secondary | ICD-10-CM

## 2020-10-25 ENCOUNTER — Other Ambulatory Visit: Payer: Self-pay | Admitting: Internal Medicine

## 2020-10-25 DIAGNOSIS — I5032 Chronic diastolic (congestive) heart failure: Secondary | ICD-10-CM

## 2020-10-25 DIAGNOSIS — I10 Essential (primary) hypertension: Secondary | ICD-10-CM

## 2020-10-25 DIAGNOSIS — I5042 Chronic combined systolic (congestive) and diastolic (congestive) heart failure: Secondary | ICD-10-CM

## 2020-10-26 DIAGNOSIS — G4733 Obstructive sleep apnea (adult) (pediatric): Secondary | ICD-10-CM | POA: Diagnosis not present

## 2020-11-05 ENCOUNTER — Other Ambulatory Visit: Payer: Self-pay | Admitting: Internal Medicine

## 2020-11-05 DIAGNOSIS — I5042 Chronic combined systolic (congestive) and diastolic (congestive) heart failure: Secondary | ICD-10-CM

## 2020-11-05 DIAGNOSIS — E118 Type 2 diabetes mellitus with unspecified complications: Secondary | ICD-10-CM

## 2020-11-07 ENCOUNTER — Telehealth: Payer: Self-pay | Admitting: Internal Medicine

## 2020-11-07 NOTE — Telephone Encounter (Signed)
LVM for pt to rtn my call to schedule AWV with [facility]. Please schedule this appt if pt calls the office    Thanks, [phone] 

## 2020-11-08 ENCOUNTER — Ambulatory Visit (INDEPENDENT_AMBULATORY_CARE_PROVIDER_SITE_OTHER): Payer: PPO | Admitting: Internal Medicine

## 2020-11-08 ENCOUNTER — Other Ambulatory Visit: Payer: Self-pay

## 2020-11-08 ENCOUNTER — Encounter: Payer: Self-pay | Admitting: Internal Medicine

## 2020-11-08 VITALS — BP 136/68 | HR 61 | Temp 98.1°F | Resp 16 | Ht 70.0 in | Wt 218.0 lb

## 2020-11-08 DIAGNOSIS — I5032 Chronic diastolic (congestive) heart failure: Secondary | ICD-10-CM | POA: Diagnosis not present

## 2020-11-08 DIAGNOSIS — I5042 Chronic combined systolic (congestive) and diastolic (congestive) heart failure: Secondary | ICD-10-CM

## 2020-11-08 DIAGNOSIS — N1832 Chronic kidney disease, stage 3b: Secondary | ICD-10-CM | POA: Diagnosis not present

## 2020-11-08 DIAGNOSIS — Z23 Encounter for immunization: Secondary | ICD-10-CM

## 2020-11-08 DIAGNOSIS — E118 Type 2 diabetes mellitus with unspecified complications: Secondary | ICD-10-CM | POA: Diagnosis not present

## 2020-11-08 DIAGNOSIS — I10 Essential (primary) hypertension: Secondary | ICD-10-CM | POA: Diagnosis not present

## 2020-11-08 DIAGNOSIS — D510 Vitamin B12 deficiency anemia due to intrinsic factor deficiency: Secondary | ICD-10-CM | POA: Diagnosis not present

## 2020-11-08 DIAGNOSIS — E11311 Type 2 diabetes mellitus with unspecified diabetic retinopathy with macular edema: Secondary | ICD-10-CM

## 2020-11-08 DIAGNOSIS — D126 Benign neoplasm of colon, unspecified: Secondary | ICD-10-CM | POA: Diagnosis not present

## 2020-11-08 DIAGNOSIS — D638 Anemia in other chronic diseases classified elsewhere: Secondary | ICD-10-CM | POA: Diagnosis not present

## 2020-11-08 LAB — BASIC METABOLIC PANEL
BUN: 22 mg/dL (ref 6–23)
CO2: 30 mEq/L (ref 19–32)
Calcium: 8.9 mg/dL (ref 8.4–10.5)
Chloride: 99 mEq/L (ref 96–112)
Creatinine, Ser: 1.35 mg/dL (ref 0.40–1.50)
GFR: 52.93 mL/min — ABNORMAL LOW (ref >60.00)
Glucose, Bld: 153 mg/dL — ABNORMAL HIGH (ref 70–99)
Potassium: 4.2 mEq/L (ref 3.5–5.1)
Sodium: 137 mEq/L (ref 135–145)

## 2020-11-08 LAB — POCT GLYCOSYLATED HEMOGLOBIN (HGB A1C): Hemoglobin A1C: 8 % — AB (ref 4.0–5.6)

## 2020-11-08 LAB — CBC WITH DIFFERENTIAL/PLATELET
Basophils Absolute: 0 10*3/uL (ref 0.0–0.1)
Basophils Relative: 0.7 % (ref 0.0–3.0)
Eosinophils Absolute: 0.2 10*3/uL (ref 0.0–0.7)
Eosinophils Relative: 4.3 % (ref 0.0–5.0)
HCT: 39.1 % (ref 39.0–52.0)
Hemoglobin: 12.5 g/dL — ABNORMAL LOW (ref 13.0–17.0)
Lymphocytes Relative: 38.9 % (ref 12.0–46.0)
Lymphs Abs: 2 10*3/uL (ref 0.7–4.0)
MCHC: 32 g/dL (ref 30.0–36.0)
MCV: 70.9 fl — ABNORMAL LOW (ref 78.0–100.0)
Monocytes Absolute: 0.5 10*3/uL (ref 0.1–1.0)
Monocytes Relative: 9.3 % (ref 3.0–12.0)
Neutro Abs: 2.4 10*3/uL (ref 1.4–7.7)
Neutrophils Relative %: 46.8 % (ref 43.0–77.0)
Platelets: 244 10*3/uL (ref 150.0–400.0)
RBC: 5.51 Mil/uL (ref 4.22–5.81)
RDW: 17.4 % — ABNORMAL HIGH (ref 11.5–15.5)
WBC: 5 10*3/uL (ref 4.0–10.5)

## 2020-11-08 MED ORDER — CYANOCOBALAMIN 1000 MCG/ML IJ SOLN
1000.0000 ug | Freq: Once | INTRAMUSCULAR | Status: AC
  Administered 2020-11-08: 1000 ug via INTRAMUSCULAR

## 2020-11-08 MED ORDER — SOLIQUA 100-33 UNT-MCG/ML ~~LOC~~ SOPN: 25.0000 [IU] | PEN_INJECTOR | Freq: Every day | SUBCUTANEOUS | 1 refills | Status: DC

## 2020-11-08 MED ORDER — INDAPAMIDE 1.25 MG PO TABS: 1.2500 mg | ORAL_TABLET | Freq: Every day | ORAL | 0 refills | Status: DC

## 2020-11-08 NOTE — Progress Notes (Addendum)
 Subjective:  Patient ID: [name], male    DOB: 05/16/1951  Age: 69 y.o. MRN: [id number]  CC: Anemia, Hypertension, Diabetes, and Hyperlipidemia  This visit occurred during the SARS-CoV-2 public health emergency.  Safety protocols were in place, including screening questions prior to the visit, additional usage of staff PPE, and extensive cleaning of exam room while observing appropriate contact time as indicated for disinfecting solutions.    HPI [name] presents for f/up - He has stable, unchanged lower extremity edema.  He denies chest pain, shortness of breath, palpitations, or fatigue.  He tells me his blood pressure and blood sugar have been well controlled.  He denies polys.  He tells me the current dose of Soliqua is 20 units a day.  His wife tells me his blood sugars are consistently above 200.  Outpatient Medications Prior to Visit  Medication Sig Dispense Refill  . Blood Glucose Monitoring Suppl (ACCU-CHEK GUIDE ME) w/Device KIT 1 Act by Does not apply route 3 (three) times daily with meals. 2 kit 0  . Cholecalciferol (VITAMIN D3) 25 MCG (1000 UT) CAPS Take by mouth.    . Continuous Blood Gluc Sensor (FREESTYLE LIBRE SENSOR SYSTEM) MISC Use 1 sensor every 14 days, then replace with new one. 6 each 3  . FARXIGA 10 MG TABS tablet TAKE 10 MG BY MOUTH DAILY. 90 tablet 1  . fluticasone (FLONASE) 50 MCG/ACT nasal spray SPRAY 2 SPRAYS INTO EACH NOSTRIL EVERY DAY 48 mL 3  . gabapentin (NEURONTIN) 300 MG capsule TAKE 1 CAPSULE BY MOUTH THREE TIMES A DAY 270 capsule 2  . glucose blood (ONETOUCH VERIO) test strip Use BID to check BS. DX E11.9 100 each 12  . Insulin Pen Needle (PEN NEEDLES) 32G X 4 MM MISC Use as directed to inject insulin daily. DX: E11.9 100 each 3  . levocetirizine (XYZAL) 5 MG tablet TAKE 1 TABLET BY MOUTH EVERY DAY IN THE EVENING 90 tablet 3  . NEXLIZET 180-10 MG TABS TAKE 1 TABLET BY MOUTH DAILY 90 tablet 1  . tamsulosin (FLOMAX) 0.4 MG CAPS capsule Take 0.4  mg by mouth once.    . traMADol (ULTRAM) 50 MG tablet TAKE 1 TABLET (50 MG TOTAL) BY MOUTH EVERY 8 (EIGHT) HOURS AS NEEDED. 270 tablet 0  . aspirin 81 MG EC tablet Take 1 tablet (81 mg total) by mouth daily. Swallow whole. 90 tablet 1  . atorvastatin (LIPITOR) 80 MG tablet TAKE 1 TABLET (80 MG TOTAL) BY MOUTH DAILY AT 6 PM. 90 tablet 1  . carvedilol (COREG) 12.5 MG tablet TAKE 1 TABLET BY MOUTH TWICE A DAY WITH A MEAL 180 tablet 3  . indapamide (LOZOL) 1.25 MG tablet TAKE 1 TABLET (1.25 MG TOTAL) BY MOUTH DAILY. 90 tablet 0  . Insulin Glargine-Lixisenatide (SOLIQUA) 100-33 UNT-MCG/ML SOPN Inject 20 Units into the skin daily. 9 mL 1  . isosorbide mononitrate (IMDUR) 120 MG 24 hr tablet TAKE 1 TABLET BY MOUTH EVERY DAY 90 tablet 2  . metFORMIN (GLUCOPHAGE) 500 MG tablet TAKE 1 TABLET (500 MG TOTAL) BY MOUTH 2 (TWO) TIMES DAILY WITH A MEAL. 180 tablet 1  . omega-3 acid ethyl esters (LOVAZA) 1 g capsule TAKE 2 CAPSULES BY MOUTH 2 TIMES DAILY 360 capsule 1  . pantoprazole (PROTONIX) 40 MG tablet Take 1 tablet (40 mg total) by mouth 2 (two) times daily. 180 tablet 3  . ranolazine (RANEXA) 500 MG 12 hr tablet TAKE 1 TABLET BY MOUTH TWICE A DAY   180 tablet 1  . nitroGLYCERIN (NITROSTAT) 0.4 MG SL tablet Place 1 tablet (0.4 mg total) under the tongue every 5 (five) minutes as needed for chest pain. 25 tablet 3   No facility-administered medications prior to visit.    ROS Review of Systems  Constitutional: Negative for appetite change, diaphoresis, fatigue and fever.  HENT: Negative.   Eyes: Positive for visual disturbance. Negative for pain.  Respiratory: Negative for cough, chest tightness, shortness of breath and wheezing.   Cardiovascular: Positive for leg swelling.  Gastrointestinal: Negative for abdominal pain, constipation, diarrhea, nausea and vomiting.  Endocrine: Negative.  Negative for polydipsia, polyphagia and polyuria.  Genitourinary: Negative.  Negative for difficulty urinating, dysuria  and hematuria.  Musculoskeletal: Negative.  Negative for arthralgias and myalgias.  Skin: Negative for color change, pallor and wound.  Neurological: Negative.  Negative for dizziness, weakness, light-headedness and numbness.  Hematological: Negative for adenopathy. Does not bruise/bleed easily.  Psychiatric/Behavioral: Negative.     Objective:  BP 136/68   Pulse 61   Temp 98.1 F (36.7 C) (Oral)   Resp 16   Ht 5' 10" (1.778 m)   Wt 218 lb (98.9 kg)   SpO2 95%   BMI 31.28 kg/m   BP Readings from Last 3 Encounters:  01/29/21 (!) 143/62  11/08/20 136/68  05/31/20 140/60    Wt Readings from Last 3 Encounters:  01/29/21 221 lb (100.2 kg)  01/15/21 221 lb (100.2 kg)  12/03/20 221 lb (100.2 kg)    Physical Exam Vitals reviewed.  HENT:     Nose: Nose normal.     Mouth/Throat:     Mouth: Mucous membranes are moist.  Eyes:     General: No scleral icterus.    Conjunctiva/sclera: Conjunctivae normal.  Cardiovascular:     Rate and Rhythm: Normal rate and regular rhythm.     Heart sounds: No murmur heard.  No gallop.   Pulmonary:     Effort: Pulmonary effort is normal.     Breath sounds: No stridor. No wheezing, rhonchi or rales.  Abdominal:     General: Abdomen is flat. Bowel sounds are normal. There is no distension.     Palpations: Abdomen is soft. There is no hepatomegaly, splenomegaly or mass.     Tenderness: There is no abdominal tenderness.  Musculoskeletal:     Cervical back: Neck supple.     Right lower leg: Edema (trace pitting) present.     Left lower leg: Edema (trace pitting) present.  Lymphadenopathy:     Cervical: No cervical adenopathy.  Skin:    General: Skin is warm.     Findings: No lesion or rash.  Neurological:     General: No focal deficit present.     Mental Status: He is alert.     Lab Results  Component Value Date   WBC 5.0 11/08/2020   HGB 12.5 (L) 11/08/2020   HCT 39.1 11/08/2020   PLT 244.0 11/08/2020   GLUCOSE 153 (H) 11/08/2020    CHOL 93 05/08/2020   TRIG 158.0 (H) 05/08/2020   HDL 22.10 (L) 05/08/2020   LDLDIRECT 82.7 04/04/2013   LDLCALC 40 05/08/2020   ALT 13 02/24/2019   AST 13 02/24/2019   NA 137 11/08/2020   K 4.2 11/08/2020   CL 99 11/08/2020   CREATININE 1.35 11/08/2020   BUN 22 11/08/2020   CO2 30 11/08/2020   TSH 1.15 02/24/2019   PSA 3.2 02/08/2020   INR 0.95 08/17/2010   HGBA1C 8.0 (A)   11/08/2020   MICROALBUR 0.5 02/08/2020    VAS US LOWER EXTREMITY VENOUS (DVT)  Result Date: 07/23/2018  Lower Venous DVT Study Indications: Edema, bilaterally. Denies SOB.  Risk Factors: None identified. Performing Technologist: Keisha Barrett RVT  Examination Guidelines: A complete evaluation includes B-mode imaging, spectral Doppler, color Doppler, and power Doppler as needed of all accessible portions of each vessel. Bilateral testing is considered an integral part of a complete examination. Limited examinations for reoccurring indications may be performed as noted. The reflux portion of the exam is performed with the patient in reverse Trendelenburg.  Right Venous Findings: +---------+---------------+---------+-----------+----------+-------+          CompressibilityPhasicitySpontaneityPropertiesSummary +---------+---------------+---------+-----------+----------+-------+ CFV      Full           Yes      Yes                          +---------+---------------+---------+-----------+----------+-------+ SFJ      Full           Yes      Yes                          +---------+---------------+---------+-----------+----------+-------+ FV Prox  Full           Yes      Yes                          +---------+---------------+---------+-----------+----------+-------+ FV Mid   Full                                                 +---------+---------------+---------+-----------+----------+-------+ FV DistalFull           Yes      Yes                           +---------+---------------+---------+-----------+----------+-------+ PFV      Full           Yes      Yes                          +---------+---------------+---------+-----------+----------+-------+ POP      Full           Yes      Yes                          +---------+---------------+---------+-----------+----------+-------+ PTV      Full           Yes      No                           +---------+---------------+---------+-----------+----------+-------+ PERO     Full           Yes      No                           +---------+---------------+---------+-----------+----------+-------+ Gastroc  Full                                                 +---------+---------------+---------+-----------+----------+-------+   GSV      Full           Yes      Yes                          +---------+---------------+---------+-----------+----------+-------+  Left Venous Findings: +---------+---------------+---------+-----------+----------+-------+          CompressibilityPhasicitySpontaneityPropertiesSummary +---------+---------------+---------+-----------+----------+-------+ CFV      Full           Yes      Yes                          +---------+---------------+---------+-----------+----------+-------+ SFJ      Full           Yes      Yes                          +---------+---------------+---------+-----------+----------+-------+ FV Prox  Full           Yes      Yes                          +---------+---------------+---------+-----------+----------+-------+ FV Mid   Full                                                 +---------+---------------+---------+-----------+----------+-------+ FV DistalFull           Yes      Yes                          +---------+---------------+---------+-----------+----------+-------+ PFV      Full           Yes      Yes                           +---------+---------------+---------+-----------+----------+-------+ POP      Full           Yes      Yes                          +---------+---------------+---------+-----------+----------+-------+ PTV      Full           Yes      No                           +---------+---------------+---------+-----------+----------+-------+ PERO     Full           Yes      No                           +---------+---------------+---------+-----------+----------+-------+ Gastroc  Full                                                 +---------+---------------+---------+-----------+----------+-------+ GSV      Full           Yes      Yes                          +---------+---------------+---------+-----------+----------+-------+  Final Interpretation: Right: No evidence of deep vein thrombosis in the lower extremity. No indirect evidence of obstruction proximal to the inguinal ligament. Left: No evidence of deep vein thrombosis in the lower extremity. No indirect evidence of obstruction proximal to the inguinal ligament.  *See table(s) above for measurements and observations. Electronically signed by Timothy Gollan MD on 07/23/2018 at 7:37:05 PM.    Final     Assessment & Plan:   [name] was seen today for anemia, hypertension, diabetes and hyperlipidemia.  Diagnoses and all orders for this visit:  Primary hypertension- His blood pressure is adequately well controlled.  Electrolytes are normal.  Renal function is stable. -     Basic metabolic panel; Future -     Basic metabolic panel  Essential hypertension -     indapamide (LOZOL) 1.25 MG tablet; Take 1 tablet (1.25 mg total) by mouth daily. -     Basic metabolic panel; Future -     Basic metabolic panel  Chronic diastolic heart failure (HCC)- Based on his symptoms and exam his fluid status is stable.  Will continue the thiazide diuretic. -     indapamide (LOZOL) 1.25 MG tablet; Take 1 tablet (1.25 mg total) by mouth  daily.  Chronic combined systolic and diastolic congestive heart failure (HCC)- See above. -     indapamide (LOZOL) 1.25 MG tablet; Take 1 tablet (1.25 mg total) by mouth daily.  Vitamin B12 deficiency anemia due to intrinsic factor deficiency- He remains mildly anemic.  I recommended that he continue to get B12 injections every 4 to 6 months -     CBC with Differential/Platelet; Future -     cyanocobalamin ((VITAMIN B-12)) injection 1,000 mcg -     CBC with Differential/Platelet  Anemia, chronic disease- His H&H are stable.  Will continue to treat the vitamin B12 deficiency and other chronic illnesses. -     CBC with Differential/Platelet; Future -     CBC with Differential/Platelet  Type 2 diabetes mellitus with complication (HCC)- His A1c remains too high at 8.0%.  I recommended that he gradually increase his dose of Soliqua until he achieves a consistent blood sugar of less than 150. -     Basic metabolic panel; Future -     POCT glycosylated hemoglobin (Hb A1C) -     Insulin Glargine-Lixisenatide (SOLIQUA) 100-33 UNT-MCG/ML SOPN; Inject 25 Units into the skin daily. -     Basic metabolic panel  Adenomatous polyp of colon, unspecified part of colon -     Ambulatory referral to Gastroenterology  Stage 3b chronic kidney disease (HCC)- His renal function has declined.  Will continue to maintain control of his blood pressure and blood sugar. -     Basic metabolic panel; Future -     Basic metabolic panel  Flu vaccine need -     Flu Vaccine QUAD High Dose(Fluad)  Diabetic macular edema of right eye (HCC)- This is followed by an ophthalmologist at [facility].   I have discontinued [name]'s Soliqua and indapamide. I am also having him maintain his glucose blood, Accu-Chek Guide Me, Vitamin D3, gabapentin, FreeStyle Libre Sensor System, tamsulosin, Pen Needles, fluticasone, Nexlizet, levocetirizine, traMADol, and Farxiga. We administered cyanocobalamin.  Meds ordered this encounter   Medications  . DISCONTD: indapamide (LOZOL) 1.25 MG tablet    Sig: Take 1 tablet (1.25 mg total) by mouth daily.    Dispense:  90 tablet    Refill:  0  . DISCONTD: Insulin Glargine-Lixisenatide (SOLIQUA)   100-33 UNT-MCG/ML SOPN    Sig: Inject 25 Units into the skin daily.    Dispense:  9 mL    Refill:  1  . cyanocobalamin ((VITAMIN B-12)) injection 1,000 mcg   I spent 50 minutes in preparing to see the patient by review of recent labs, imaging and procedures, obtaining and reviewing separately obtained history, communicating with the patient and family or caregiver, ordering medications, tests or procedures, and documenting clinical information in the EHR including the differential Dx, treatment, and any further evaluation and other management of 1. Essential hypertension 2. Chronic diastolic heart failure (HCC) 3. Chronic combined systolic and diastolic congestive heart failure (HCC) 4. Primary hypertension 5. Vitamin B12 deficiency anemia due to intrinsic factor deficiency 6. Anemia, chronic disease 7. Type 2 diabetes mellitus with complication (HCC) 8. Adenomatous polyp of colon, unspecified part of colon 9. Stage 3b chronic kidney disease (HCC) 10. Diabetic macular edema of right eye (HCC)     Follow-up: Return in about 6 months (around 05/08/2021).  Thomas Jones, MD 

## 2020-11-08 NOTE — Patient Instructions (Signed)
Type 2 Diabetes Mellitus, Diagnosis, Adult Type 2 diabetes (type 2 diabetes mellitus) is a long-term (chronic) disease. In type 2 diabetes, one or both of these problems may be present:  The pancreas does not make enough of a hormone called insulin.  Cells in the body do not respond properly to insulin that the body makes (insulin resistance). Normally, insulin allows blood sugar (glucose) to enter cells in the body. The cells use glucose for energy. Insulin resistance or lack of insulin causes excess glucose to build up in the blood instead of going into cells. As a result, high blood glucose (hyperglycemia) develops. What increases the risk? The following factors may make you more likely to develop type 2 diabetes:  Having a family member with type 2 diabetes.  Being overweight or obese.  Having an inactive (sedentary) lifestyle.  Having been diagnosed with insulin resistance.  Having a history of prediabetes, gestational diabetes, or polycystic ovary syndrome (PCOS).  Being of American-Indian, African-American, Hispanic/Latino, or Asian/Pacific Islander descent. What are the signs or symptoms? In the early stage of this condition, you may not have symptoms. Symptoms develop slowly and may include:  Increased thirst (polydipsia).  Increased hunger(polyphagia).  Increased urination (polyuria).  Increased urination during the night (nocturia).  Unexplained weight loss.  Frequent infections that keep coming back (recurring).  Fatigue.  Weakness.  Vision changes, such as blurry vision.  Cuts or bruises that are slow to heal.  Tingling or numbness in the hands or feet.  Dark patches on the skin (acanthosis nigricans). How is this diagnosed? This condition is diagnosed based on your symptoms, your medical history, a physical exam, and your blood glucose level. Your blood glucose may be checked with one or more of the following blood tests:  A fasting blood glucose (FBG)  test. You will not be allowed to eat (you will fast) for 8 hours or longer before a blood sample is taken.  A random blood glucose test. This test checks blood glucose at any time of day regardless of when you ate.  An A1c (hemoglobin A1c) blood test. This test provides information about blood glucose control over the previous 2-3 months.  An oral glucose tolerance test (OGTT). This test measures your blood glucose at two times: ? After fasting. This is your baseline blood glucose level. ? Two hours after drinking a beverage that contains glucose. You may be diagnosed with type 2 diabetes if:  Your FBG level is 126 mg/dL (7.0 mmol/L) or higher.  Your random blood glucose level is 200 mg/dL (11.1 mmol/L) or higher.  Your A1c level is 6.5% or higher.  Your OGTT result is higher than 200 mg/dL (11.1 mmol/L). These blood tests may be repeated to confirm your diagnosis. How is this treated? Your treatment may be managed by a specialist called an endocrinologist. Type 2 diabetes may be treated by following instructions from your health care provider about:  Making diet and lifestyle changes. This may include: ? Following an individualized nutrition plan that is developed by a diet and nutrition specialist (registered dietitian). ? Exercising regularly. ? Finding ways to manage stress.  Checking your blood glucose level as often as told.  Taking diabetes medicines or insulin daily. This helps to keep your blood glucose levels in the healthy range. ? If you use insulin, you may need to adjust the dosage depending on how physically active you are and what foods you eat. Your health care provider will tell you how to adjust your dosage.    Taking medicines to help prevent complications from diabetes, such as: ? Aspirin. ? Medicine to lower cholesterol. ? Medicine to control blood pressure. Your health care provider will set individualized treatment goals for you. Your goals will be based on  your age, other medical conditions you have, and how you respond to diabetes treatment. Generally, the goal of treatment is to maintain the following blood glucose levels:  Before meals (preprandial): 80-130 mg/dL (4.4-7.2 mmol/L).  After meals (postprandial): below 180 mg/dL (10 mmol/L).  A1c level: less than 7%. Follow these instructions at home: Questions to ask your health care provider  Consider asking the following questions: ? Do I need to meet with a diabetes educator? ? Where can I find a support group for people with diabetes? ? What equipment will I need to manage my diabetes at home? ? What diabetes medicines do I need, and when should I take them? ? How often do I need to check my blood glucose? ? What number can I call if I have questions? ? When is my next appointment? General instructions  Take over-the-counter and prescription medicines only as told by your health care provider.  Keep all follow-up visits as told by your health care provider. This is important.  For more information about diabetes, visit: ? American Diabetes Association (ADA): www.diabetes.org ? American Association of Diabetes Educators (AADE): www.diabeteseducator.org Contact a health care provider if:  Your blood glucose is at or above 240 mg/dL (13.3 mmol/L) for 2 days in a row.  You have been sick or have had a fever for 2 days or longer, and you are not getting better.  You have any of the following problems for more than 6 hours: ? You cannot eat or drink. ? You have nausea and vomiting. ? You have diarrhea. Get help right away if:  Your blood glucose is lower than 54 mg/dL (3.0 mmol/L).  You become confused or you have trouble thinking clearly.  You have difficulty breathing.  You have moderate or large ketone levels in your urine. Summary  Type 2 diabetes (type 2 diabetes mellitus) is a long-term (chronic) disease. In type 2 diabetes, the pancreas does not make enough of a  hormone called insulin, or cells in the body do not respond properly to insulin that the body makes (insulin resistance).  This condition is treated by making diet and lifestyle changes and taking diabetes medicines or insulin.  Your health care provider will set individualized treatment goals for you. Your goals will be based on your age, other medical conditions you have, and how you respond to diabetes treatment.  Keep all follow-up visits as told by your health care provider. This is important. This information is not intended to replace advice given to you by your health care provider. Make sure you discuss any questions you have with your health care provider. Document Revised: 02/05/2018 Document Reviewed: 01/11/2016 Elsevier Patient Education  2020 Elsevier Inc.  

## 2020-11-26 ENCOUNTER — Other Ambulatory Visit: Payer: Self-pay | Admitting: Internal Medicine

## 2020-11-26 ENCOUNTER — Encounter: Payer: Self-pay | Admitting: Internal Medicine

## 2020-11-26 DIAGNOSIS — E118 Type 2 diabetes mellitus with unspecified complications: Secondary | ICD-10-CM

## 2020-11-26 MED ORDER — SOLIQUA 100-33 UNT-MCG/ML ~~LOC~~ SOPN: 60.0000 [IU] | PEN_INJECTOR | Freq: Every day | SUBCUTANEOUS | 1 refills | Status: DC

## 2020-11-29 DIAGNOSIS — G4733 Obstructive sleep apnea (adult) (pediatric): Secondary | ICD-10-CM | POA: Diagnosis not present

## 2020-11-29 NOTE — Progress Notes (Signed)
Telehealth Visit     Virtual Visit via Telephone Note   This visit type was conducted due to national recommendations for restrictions regarding the COVID-19 Pandemic (e.g. social distancing) in an effort to limit this patient's exposure and mitigate transmission in our community.  Due to his co-morbid illnesses, this patient is at least at moderate risk for complications without adequate follow up.  This format is felt to be most appropriate for this patient at this time.  The patient did not have access to video technology/had technical difficulties with video requiring transitioning to audio format only (telephone).  All issues noted in this document were discussed and addressed.  No physical exam could be performed with this format.  Please refer to the patient's chart for his  consent to telehealth for [facility].   Evaluation Performed:  Follow-up visit   The patient was identified using 2 identifiers.   This visit type was conducted due to national recommendations for restrictions regarding the COVID-19 Pandemic (e.g. social distancing).  This format is felt to be most appropriate for this patient at this time.  All issues noted in this document were discussed and addressed.  No physical exam was performed (except for noted visual exam findings with Video Visits).  Please refer to the patient's chart (MyChart message for video visits and phone note for telephone visits) for the patient's consent to telehealth for [facility].  Date:  12/03/2020   ID:  [name], DOB [birth year], MRN [id number]  Patient Location:  Home  Provider location:   [facility]  PCP:  [name], MD  Cardiologist:   Candee Furbish, MD  Electrophysiologist:  None   Chief Complaint:  Follow up  History of Present Illness:    TEVAN MARIAN is a 69 y.o. male who presents via audio/video conferencing for a telehealth visit today.  Seen for Dr. Marlou Porch.   He has a history of known CAD  with prior BMS stenting of the LCX, DM, polyneuropathy, HTN, HLD, obesity, OSA and blindness.    Cardiac catheterization back in 2008 demonstrated circumflex disease and he had PCI with bare-metal stent placement by Dr. Ilda Foil at the time.  Right coronary artery was managed medically and not favorable for PCI. Low risk Myoview in 2017. Has previously had dramative weight loss.   Last seen a year ago - felt to be doing well.   The patient does not have symptoms concerning for COVID-19 infection (fever, chills, cough, or new shortness of breath).   Seen today by telephone visit. The video portion never worked for them. He has consented for this visit. No BP cuff at home - no vitals today. Says he is doing ok. Does some errands with is wife but no real exercise. No chest pain. Breathing ok. No NTG use but needs refills. They have no real concerns other than a BP cuff.   Past Medical History:  Diagnosis Date  . Arthritis    "mild arthritis in hip"  . CAD (coronary artery disease)   . CHF (congestive heart failure) ([place])   . Complication of anesthesia    "lungs filled up with fluid"   . Diabetes mellitus    Type II  . Glaucoma   . Heart murmur   . HTN (hypertension)   . Hypercholesterolemia   . LBP (low back pain)   . Legally blind   . Peripheral neuropathy   . Sleep apnea   . Staph infection    "  from back surgery"  . Trigger finger   . Ulcer   . Wears glasses    "to protect cornea" - legally blind   Past Surgical History:  Procedure Laterality Date  . CHOLECYSTECTOMY  09/15/2012   Procedure: LAPAROSCOPIC CHOLECYSTECTOMY WITH INTRAOPERATIVE CHOLANGIOGRAM;  Surgeon: Pedro Earls, MD;  Location: [place];  Service: General;  Laterality: N/A;  . EYE SURGERY Bilateral    cataracts  . INGUINAL HERNIA REPAIR     right  . LUMBAR FUSION    . LUMBAR LAMINECTOMY/DECOMPRESSION MICRODISCECTOMY N/A 10/26/2015   Procedure: RIGHT AND CENTRAL LUMBAR LAMINECTOMY L3-4, RIGHT L5-S1 LATERAL  RECESS DECOMPRESSION;  Surgeon: Jessy Oto, MD;  Location: [place];  Service: Orthopedics;  Laterality: N/A;  . NECK SURGERY    . peptic ulcer dz surgery  pt was in his 20s   bleeding ulcer.   . Prosthetic Cornea placement, Right eye  2007   [place] Right 10/26/2015   Procedure: RELEASE TRIGGER FINGER RIGHT THUMB;  Surgeon: Jessy Oto, MD;  Location: [place];  Service: Orthopedics;  Laterality: Right;     Current Meds  Medication Sig  . aspirin 81 MG EC tablet Take 1 tablet (81 mg total) by mouth daily. Swallow whole.  Marland Kitchen atorvastatin (LIPITOR) 80 MG tablet TAKE 1 TABLET (80 MG TOTAL) BY MOUTH DAILY AT 6 PM.  . Blood Glucose Monitoring Suppl (ACCU-CHEK GUIDE ME) w/Device KIT 1 Act by Does not apply route 3 (three) times daily with meals.  . Cholecalciferol (VITAMIN D3) 25 MCG (1000 UT) CAPS Take by mouth.  . Continuous Blood Gluc Sensor (FREESTYLE LIBRE SENSOR SYSTEM) MISC Use 1 sensor every 14 days, then replace with new one.  Marland Kitchen FARXIGA 10 MG TABS tablet TAKE 10 MG BY MOUTH DAILY.  . fluticasone (FLONASE) 50 MCG/ACT nasal spray SPRAY 2 SPRAYS INTO EACH NOSTRIL EVERY DAY  . gabapentin (NEURONTIN) 300 MG capsule TAKE 1 CAPSULE BY MOUTH THREE TIMES A DAY  . glucose blood (ONETOUCH VERIO) test strip Use BID to check BS. DX E11.9  . Insulin Glargine-Lixisenatide (SOLIQUA) 100-33 UNT-MCG/ML SOPN Inject 60 Units into the skin daily.  . Insulin Pen Needle (PEN NEEDLES) 32G X 4 MM MISC Use as directed to inject insulin daily. DX: E11.9  . isosorbide mononitrate (IMDUR) 120 MG 24 hr tablet TAKE 1 TABLET BY MOUTH EVERY DAY  . levocetirizine (XYZAL) 5 MG tablet TAKE 1 TABLET BY MOUTH EVERY DAY IN THE EVENING  . metFORMIN (GLUCOPHAGE) 500 MG tablet TAKE 1 TABLET (500 MG TOTAL) BY MOUTH 2 (TWO) TIMES DAILY WITH A MEAL.  Marland Kitchen NEXLIZET 180-10 MG TABS TAKE 1 TABLET BY MOUTH DAILY  . omega-3 acid ethyl esters (LOVAZA) 1 g capsule TAKE 2 CAPSULES BY MOUTH 2 TIMES DAILY  .  pantoprazole (PROTONIX) 40 MG tablet Take 1 tablet (40 mg total) by mouth 2 (two) times daily.  . ranolazine (RANEXA) 500 MG 12 hr tablet TAKE 1 TABLET BY MOUTH TWICE A DAY  . tamsulosin (FLOMAX) 0.4 MG CAPS capsule Take 0.4 mg by mouth once.  . traMADol (ULTRAM) 50 MG tablet TAKE 1 TABLET (50 MG TOTAL) BY MOUTH EVERY 8 (EIGHT) HOURS AS NEEDED.  . [DISCONTINUED] carvedilol (COREG) 12.5 MG tablet TAKE 1 TABLET BY MOUTH TWICE A DAY WITH A MEAL  . [DISCONTINUED] indapamide (LOZOL) 1.25 MG tablet TAKE 1 TABLET (1.25 MG TOTAL) BY MOUTH DAILY.  . [DISCONTINUED] nitroGLYCERIN (NITROSTAT) 0.4 MG SL tablet Place 1 tablet (  0.4 mg total) under the tongue every 5 (five) minutes as needed for chest pain.     Allergies:   Furosemide, Olmesartan, Amlodipine, Diamox [acetazolamide], and Lisinopril   Social History   Tobacco Use  . Smoking status: Never Smoker  . Smokeless tobacco: Never Used  Vaping Use  . Vaping Use: Never used  Substance Use Topics  . Alcohol use: No  . Drug use: No     Family Hx: The patient's family history includes Breast cancer in his mother; Colon cancer in his mother and another family member; Diabetes in his brother, mother, and sister; Hypertension in his father and mother.  ROS:   Please see the history of present illness.   All other systems reviewed are negative.    Objective:    Vital Signs:  Ht 5' 10"  (1.778 m)   Wt 221 lb (100.2 kg)   BMI 31.71 kg/m    Wt Readings from Last 3 Encounters:  12/03/20 221 lb (100.2 kg)  11/08/20 218 lb (98.9 kg)  05/31/20 235 lb (106.6 kg)    Alert male in no acute distress. He sounds good with conversation - not short of breath.    Labs/Other Tests and Data Reviewed:    Lab Results  Component Value Date   WBC 5.0 11/08/2020   HGB 12.5 (L) 11/08/2020   HCT 39.1 11/08/2020   PLT 244.0 11/08/2020   GLUCOSE 153 (H) 11/08/2020   CHOL 93 05/08/2020   TRIG 158.0 (H) 05/08/2020   HDL 22.10 (L) 05/08/2020   LDLDIRECT  82.7 04/04/2013   LDLCALC 40 05/08/2020   ALT 13 02/24/2019   AST 13 02/24/2019   NA 137 11/08/2020   K 4.2 11/08/2020   CL 99 11/08/2020   CREATININE 1.35 11/08/2020   BUN 22 11/08/2020   CO2 30 11/08/2020   TSH 1.15 02/24/2019   PSA 3.2 02/08/2020   INR 0.95 08/17/2010   HGBA1C 8.0 (A) 11/08/2020   MICROALBUR 0.5 02/08/2020     BNP (last 3 results) No results for input(s): BNP in the last 8760 hours.  ProBNP (last 3 results) Recent Labs    05/10/20 1214  PROBNP 26.0      Prior CV studies:    The following studies were reviewed today:  Echocardiogram 07/10/2017: - Left ventricle: The cavity size was normal. There was severe   concentric hypertrophy. Systolic function was normal. The   estimated ejection fraction was in the range of 60% to 65%. Wall   motion was normal; there were no regional wall motion   abnormalities. Doppler parameters are consistent with abnormal   left ventricular relaxation (grade 1 diastolic dysfunction).   Doppler parameters are consistent with elevated ventricular   end-diastolic filling pressure. - Aortic valve: Trileaflet; normal thickness leaflets. There was no   regurgitation. - Aortic root: The aortic root was normal in size. - Mitral valve: Structurally normal valve. There was mild   regurgitation. Valve area by pressure half-time: 1.29 cm^2. - Left atrium: The atrium was normal in size. - Right ventricle: Systolic function was normal. - Right atrium: The atrium was normal in size. - Tricuspid valve: There was no regurgitation. - Pulmonary arteries: Systolic pressure was within the normal   range. - Inferior vena cava: The vessel was normal in size. - Pericardium, extracardiac: There was no pericardial effusion.   Nuclear stress test 07/27/16:  - Low risk, no ischemia, ejection fraction 56%   Lower extremity Dopplers 07/22/2018: -No DVTs  ASSESSMENT & PLAN:    1.  CAD - no active chest pain.   2. Remote PCI - see above.    3. HTN - unknown - will try to send BP cuff to them.   4. HLD - labs from last month noted.   5. Obesity - seems unchanged.   6. DM - per PCP   Patient Risk:   After full review of this patient's clinical status, I feel that they are at least moderate risk at this time.  Time:   Today, I have spent 5 minutes with the patient with telehealth technology discussing the above issues.     Medication Adjustments/Labs and Tests Ordered: Current medicines are reviewed at length with the patient today.  Concerns regarding medicines are outlined above.   Tests Ordered: No orders of the defined types were placed in this encounter.   Medication Changes: Meds ordered this encounter  Medications  . carvedilol (COREG) 12.5 MG tablet    Sig: TAKE 1 TABLET BY MOUTH TWICE A DAY WITH A MEAL    Dispense:  180 tablet    Refill:  3    Order Specific Question:   Supervising Provider    Answer:   Martinique, PETER M [[id number]]  . nitroGLYCERIN (NITROSTAT) 0.4 MG SL tablet    Sig: Place 1 tablet (0.4 mg total) under the tongue every 5 (five) minutes as needed for chest pain.    Dispense:  25 tablet    Refill:  3    Order Specific Question:   Supervising Provider    Answer:   [place], PETER M [[id number]]    Disposition:  FU with Dr. Marlou Porch in the office in 6 months with EKG. Coreg and sl NTG refilled for them today. Will try to send BP cuff to them as well.    Patient is agreeable to this plan and will call if any problems develop in the interim.   Amie Critchley, NP  12/03/2020 8:52 AM    [facility]

## 2020-12-03 ENCOUNTER — Telehealth (INDEPENDENT_AMBULATORY_CARE_PROVIDER_SITE_OTHER): Payer: PPO | Admitting: Nurse Practitioner

## 2020-12-03 ENCOUNTER — Encounter: Payer: Self-pay | Admitting: Nurse Practitioner

## 2020-12-03 ENCOUNTER — Other Ambulatory Visit: Payer: Self-pay

## 2020-12-03 VITALS — Ht 70.0 in | Wt 221.0 lb

## 2020-12-03 DIAGNOSIS — I10 Essential (primary) hypertension: Secondary | ICD-10-CM | POA: Diagnosis not present

## 2020-12-03 DIAGNOSIS — E7800 Pure hypercholesterolemia, unspecified: Secondary | ICD-10-CM

## 2020-12-03 DIAGNOSIS — I5022 Chronic systolic (congestive) heart failure: Secondary | ICD-10-CM | POA: Diagnosis not present

## 2020-12-03 DIAGNOSIS — I2510 Atherosclerotic heart disease of native coronary artery without angina pectoris: Secondary | ICD-10-CM

## 2020-12-03 MED ORDER — CARVEDILOL 12.5 MG PO TABS: ORAL_TABLET | ORAL | 3 refills | Status: DC

## 2020-12-03 MED ORDER — NITROGLYCERIN 0.4 MG SL SUBL: 0.4000 mg | SUBLINGUAL_TABLET | SUBLINGUAL | 3 refills | Status: DC | PRN

## 2020-12-03 NOTE — Patient Instructions (Addendum)
After Visit Summary:  We will be checking the following labs today - NONE   Medication Instructions:    Continue with your current medicines.  I sent in your sl NTG and refilled the Coreg today.     If you need a refill on your cardiac medications before your next appointment, please call your pharmacy.     Testing/Procedures To Be Arranged:  N/A  Follow-Up:   See Dr. Marlou Porch in 6 months with EKG    At [facility], you and your health needs are our priority.  As part of our continuing mission to provide you with exceptional heart care, we have created designated Provider Care Teams.  These Care Teams include your primary Cardiologist (physician) and Advanced Practice Providers (APPs -  Physician Assistants and Nurse Practitioners) who all work together to provide you with the care you need, when you need it.  Special Instructions:  . Stay safe, wash your hands for at least 20 seconds and wear a mask when needed.  . It was good to talk with you today.  . We will try to get a BP cuff sent to you.    Call the [place] office at [phone] if you have any questions, problems or concerns.

## 2020-12-04 ENCOUNTER — Other Ambulatory Visit: Payer: Self-pay | Admitting: Cardiology

## 2020-12-04 ENCOUNTER — Telehealth: Payer: Self-pay | Admitting: Licensed Clinical Social Worker

## 2020-12-04 NOTE — Telephone Encounter (Signed)
CSW referred to assist patient with obtaining a BP cuff. CSW contacted patient to inform cuff will be delivered to home. Patient grateful for support and assistance. CSW available as needed. Jackie Tsering Leaman, LCSW, CCSW-MCS [phone]  

## 2020-12-07 DIAGNOSIS — Z4420 Encounter for fitting and adjustment of artificial eye, unspecified: Secondary | ICD-10-CM | POA: Diagnosis not present

## 2020-12-20 ENCOUNTER — Ambulatory Visit: Payer: PPO

## 2020-12-21 ENCOUNTER — Other Ambulatory Visit: Payer: Self-pay | Admitting: Internal Medicine

## 2020-12-21 DIAGNOSIS — Z794 Long term (current) use of insulin: Secondary | ICD-10-CM

## 2020-12-21 DIAGNOSIS — E118 Type 2 diabetes mellitus with unspecified complications: Secondary | ICD-10-CM

## 2020-12-30 ENCOUNTER — Other Ambulatory Visit: Payer: Self-pay | Admitting: Internal Medicine

## 2021-01-02 DIAGNOSIS — R972 Elevated prostate specific antigen [PSA]: Secondary | ICD-10-CM | POA: Diagnosis not present

## 2021-01-02 LAB — PSA: PSA: 2.51

## 2021-01-13 ENCOUNTER — Other Ambulatory Visit: Payer: Self-pay | Admitting: Internal Medicine

## 2021-01-13 DIAGNOSIS — I5042 Chronic combined systolic (congestive) and diastolic (congestive) heart failure: Secondary | ICD-10-CM

## 2021-01-13 DIAGNOSIS — E118 Type 2 diabetes mellitus with unspecified complications: Secondary | ICD-10-CM

## 2021-01-14 ENCOUNTER — Other Ambulatory Visit: Payer: Self-pay | Admitting: Internal Medicine

## 2021-01-14 DIAGNOSIS — I5042 Chronic combined systolic (congestive) and diastolic (congestive) heart failure: Secondary | ICD-10-CM

## 2021-01-14 DIAGNOSIS — E118 Type 2 diabetes mellitus with unspecified complications: Secondary | ICD-10-CM

## 2021-01-14 NOTE — Telephone Encounter (Signed)
    Patient calling to request refill today. He has no medication remaining

## 2021-01-15 ENCOUNTER — Other Ambulatory Visit: Payer: Self-pay

## 2021-01-15 ENCOUNTER — Ambulatory Visit (AMBULATORY_SURGERY_CENTER): Payer: PPO | Admitting: *Deleted

## 2021-01-15 VITALS — Ht 71.0 in | Wt 221.0 lb

## 2021-01-15 DIAGNOSIS — Z86010 Personal history of colonic polyps: Secondary | ICD-10-CM

## 2021-01-15 DIAGNOSIS — I2510 Atherosclerotic heart disease of native coronary artery without angina pectoris: Secondary | ICD-10-CM

## 2021-01-15 DIAGNOSIS — Z800 Family history of malignant neoplasm of digestive organs: Secondary | ICD-10-CM

## 2021-01-15 MED ORDER — ASPIRIN 81 MG PO TBEC: 81.0000 mg | DELAYED_RELEASE_TABLET | Freq: Every day | ORAL | 1 refills | Status: DC

## 2021-01-15 MED ORDER — NA SULFATE-K SULFATE-MG SULF 17.5-3.13-1.6 GM/177ML PO SOLN: 1.0000 | Freq: Once | ORAL | 0 refills | Status: DC

## 2021-01-15 MED ORDER — NA SULFATE-K SULFATE-MG SULF 17.5-3.13-1.6 GM/177ML PO SOLN: 1.0000 | Freq: Once | ORAL | 0 refills | Status: AC

## 2021-01-15 NOTE — Progress Notes (Addendum)
No egg or soy allergy known to patient  No issues with past sedation with any surgeries or procedures No intubation problems in the past  No FH of Malignant Hyperthermia No diet pills per patient No home 02 use per patient  No blood thinners per patient  Pt denies issues with constipation  No A fib or A flutter  EMMI video to pt or via [place] 19 guidelines implemented in PV today with Pt and RN  Pt is fully vaccinated  for Covid    Virtual pre visit completed. Instructions mailed .  Due to the COVID-19 pandemic we are asking patients to follow certain guidelines.  Pt aware of COVID protocols and [facility] guidelines

## 2021-01-17 ENCOUNTER — Other Ambulatory Visit: Payer: Self-pay | Admitting: Internal Medicine

## 2021-01-17 DIAGNOSIS — E118 Type 2 diabetes mellitus with unspecified complications: Secondary | ICD-10-CM

## 2021-01-18 ENCOUNTER — Other Ambulatory Visit: Payer: Self-pay | Admitting: Internal Medicine

## 2021-01-22 ENCOUNTER — Other Ambulatory Visit: Payer: Self-pay | Admitting: Internal Medicine

## 2021-01-22 DIAGNOSIS — I2510 Atherosclerotic heart disease of native coronary artery without angina pectoris: Secondary | ICD-10-CM

## 2021-01-22 DIAGNOSIS — E785 Hyperlipidemia, unspecified: Secondary | ICD-10-CM

## 2021-01-29 ENCOUNTER — Encounter: Payer: Self-pay | Admitting: Gastroenterology

## 2021-01-29 ENCOUNTER — Ambulatory Visit (AMBULATORY_SURGERY_CENTER): Payer: PPO | Admitting: Gastroenterology

## 2021-01-29 ENCOUNTER — Other Ambulatory Visit: Payer: Self-pay

## 2021-01-29 VITALS — BP 143/62 | HR 70 | Temp 98.6°F | Resp 10 | Ht 70.0 in | Wt 221.0 lb

## 2021-01-29 DIAGNOSIS — D124 Benign neoplasm of descending colon: Secondary | ICD-10-CM

## 2021-01-29 DIAGNOSIS — Z1211 Encounter for screening for malignant neoplasm of colon: Secondary | ICD-10-CM | POA: Diagnosis not present

## 2021-01-29 DIAGNOSIS — Z86010 Personal history of colonic polyps: Secondary | ICD-10-CM | POA: Diagnosis not present

## 2021-01-29 DIAGNOSIS — K635 Polyp of colon: Secondary | ICD-10-CM | POA: Diagnosis not present

## 2021-01-29 LAB — HM COLONOSCOPY

## 2021-01-29 MED ORDER — SODIUM CHLORIDE 0.9 % IV SOLN: 500.0000 mL | Freq: Once | INTRAVENOUS | Status: DC

## 2021-01-29 NOTE — Patient Instructions (Signed)
Handouts provided on polyps and diverticulosis.  ? ?YOU HAD AN ENDOSCOPIC PROCEDURE TODAY AT THE [facility]:   Refer to the procedure report that was given to you for any specific questions about what was found during the examination.  If the procedure report does not answer your questions, please call your gastroenterologist to clarify.  If you requested that your care partner not be given the details of your procedure findings, then the procedure report has been included in a sealed envelope for you to review at your convenience later. ? ?YOU SHOULD EXPECT: Some feelings of bloating in the abdomen. Passage of more gas than usual.  Walking can help get rid of the air that was put into your GI tract during the procedure and reduce the bloating. If you had a lower endoscopy (such as a colonoscopy or flexible sigmoidoscopy) you may notice spotting of blood in your stool or on the toilet paper. If you underwent a bowel prep for your procedure, you may not have a normal bowel movement for a few days. ? ?Please Note:  You might notice some irritation and congestion in your nose or some drainage.  This is from the oxygen used during your procedure.  There is no need for concern and it should clear up in a day or so. ? ?SYMPTOMS TO REPORT IMMEDIATELY: ? ?Following lower endoscopy (colonoscopy or flexible sigmoidoscopy): ? Excessive amounts of blood in the stool ? Significant tenderness or worsening of abdominal pains ? Swelling of the abdomen that is new, acute ? Fever of 100?F or higher ? ?For urgent or emergent issues, a gastroenterologist can be reached at any hour by calling [phone]. ?Do not use MyChart messaging for urgent concerns.  ? ? ?DIET:  We do recommend a small meal at first, but then you may proceed to your regular diet.  Drink plenty of fluids but you should avoid alcoholic beverages for 24 hours. ? ?ACTIVITY:  You should plan to take it easy for the rest of today and you should NOT  DRIVE or use heavy machinery until tomorrow (because of the sedation medicines used during the test).   ? ?FOLLOW UP: ?Our staff will call the number listed on your records 48-72 hours following your procedure to check on you and address any questions or concerns that you may have regarding the information given to you following your procedure. If we do not reach you, we will leave a message.  We will attempt to reach you two times.  During this call, we will ask if you have developed any symptoms of COVID 19. If you develop any symptoms (ie: fever, flu-like symptoms, shortness of breath, cough etc.) before then, please call [phone].  If you test positive for Covid 19 in the 2 weeks post procedure, please call and report this information to us.   ? ?If any biopsies were taken you will be contacted by phone or by letter within the next 1-3 weeks.  Please call us at [phone] if you have not heard about the biopsies in 3 weeks.  ? ? ?SIGNATURES/CONFIDENTIALITY: ?You and/or your care partner have signed paperwork which will be entered into your electronic medical record.  These signatures attest to the fact that that the information above on your After Visit Summary has been reviewed and is understood.  Full responsibility of the confidentiality of this discharge information lies with you and/or your care-partner. ? ?

## 2021-01-29 NOTE — Progress Notes (Signed)
Called to room to assist during endoscopic procedure.  Patient ID and intended procedure confirmed with present staff. Received instructions for my participation in the procedure from the performing physician.  

## 2021-01-29 NOTE — Progress Notes (Signed)
A and O x3. Report to RN. Tolerated MAC anesthesia well.

## 2021-01-29 NOTE — Progress Notes (Signed)
Pt's states no medical or surgical changes since previsit or office visit.  ° °Cw vitals  °

## 2021-01-29 NOTE — Op Note (Signed)
[facility] Patient Name: [name] Procedure Date: 01/29/2021 8:29 AM MRN: [id number] Endoscopist: Milus Banister , MD Age: 70 Referring MD:  Date of Birth: 26-Apr-1951 Gender: Male Account #: [id number] Procedure:                Colonoscopy Indications:              High risk colon cancer surveillance: Elevated risk                            for colon cancer, his mother had colon cancer                            (diagnosed in her 5s) and he has had adenomatous                            polyps. Colonoscopy November 2011 Dr. Ardis Hughs found                            two subcentimeter polyps. Left-sided                            diverticulosis. The polyps were both adenomas. Medicines:                Monitored Anesthesia Care Procedure:                Pre-Anesthesia Assessment:                           - Prior to the procedure, a History and Physical                            was performed, and patient medications and                            allergies were reviewed. The patient's tolerance of                            previous anesthesia was also reviewed. The risks                            and benefits of the procedure and the sedation                            options and risks were discussed with the patient.                            All questions were answered, and informed consent                            was obtained. Prior Anticoagulants: The patient has                            taken no previous anticoagulant or antiplatelet  agents. ASA Grade Assessment: III - A patient with                            severe systemic disease. After reviewing the risks                            and benefits, the patient was deemed in                            satisfactory condition to undergo the procedure.                           After obtaining informed consent, the colonoscope                            was passed under direct  vision. Throughout the                            procedure, the patient's blood pressure, pulse, and                            oxygen saturations were monitored continuously. The                            Olympus [id number]) Colonoscope was                            introduced through the anus and advanced to the the                            cecum, identified by appendiceal orifice and                            ileocecal valve. The colonoscopy was performed                            without difficulty. The patient tolerated the                            procedure well. The quality of the bowel                            preparation was good. The ileocecal valve,                            appendiceal orifice, and rectum were photographed. Scope In: 8:55:39 AM Scope Out: 9:11:43 AM Scope Withdrawal Time: 0 hours 12 minutes 11 seconds  Total Procedure Duration: 0 hours 16 minutes 4 seconds  Findings:                 A 3 mm polyp was found in the descending colon. The                            polyp was sessile. The polyp was  removed with a                            cold snare. Resection and retrieval were complete.                           Multiple small and large-mouthed diverticula were                            found in the left colon.                           The exam was otherwise without abnormality on                            direct and retroflexion views. Complications:            No immediate complications. Estimated blood loss:                            None. Estimated Blood Loss:     Estimated blood loss: none. Impression:               - One 3 mm polyp in the descending colon, removed                            with a cold snare. Resected and retrieved.                           - Diverticulosis in the left colon.                           - The examination was otherwise normal on direct                            and retroflexion  views. Recommendation:           - Patient has a contact number available for                            emergencies. The signs and symptoms of potential                            delayed complications were discussed with the                            patient. Return to normal activities tomorrow.                            Written discharge instructions were provided to the                            patient.                           - Resume previous diet.                           -  Continue present medications.                           - Await pathology results. Milus Banister, MD 01/29/2021 9:19:57 AM This report has been signed electronically.

## 2021-01-31 ENCOUNTER — Telehealth: Payer: Self-pay

## 2021-01-31 NOTE — Telephone Encounter (Signed)
  Follow up Call-  Call back number 01/29/2021  Post procedure Call Back phone  # [phone]  Permission to leave phone message Yes  Some recent data might be hidden     Patient questions:  Do you have a fever, pain , or abdominal swelling? No. Pain Score  0 *  Have you tolerated food without any problems? Yes.    Have you been able to return to your normal activities? Yes.    Do you have any questions about your discharge instructions: Diet   No. Medications  No. Follow up visit  No.  Do you have questions or concerns about your Care? No.  Actions: * If pain score is 4 or above: No action needed, pain <4. 1. Have you developed a fever since your procedure? no  2.   Have you had an respiratory symptoms (SOB or cough) since your procedure? no  3.   Have you tested positive for COVID 19 since your procedure no  4.   Have you had any family members/close contacts diagnosed with the COVID 19 since your procedure?  no   If yes to any of these questions please route to Joylene John, RN and Joella Prince, RN

## 2021-02-04 DIAGNOSIS — R972 Elevated prostate specific antigen [PSA]: Secondary | ICD-10-CM | POA: Diagnosis not present

## 2021-02-04 DIAGNOSIS — R3915 Urgency of urination: Secondary | ICD-10-CM | POA: Diagnosis not present

## 2021-02-04 DIAGNOSIS — N401 Enlarged prostate with lower urinary tract symptoms: Secondary | ICD-10-CM | POA: Diagnosis not present

## 2021-02-06 ENCOUNTER — Other Ambulatory Visit: Payer: Self-pay | Admitting: Internal Medicine

## 2021-02-06 DIAGNOSIS — I5042 Chronic combined systolic (congestive) and diastolic (congestive) heart failure: Secondary | ICD-10-CM

## 2021-02-06 DIAGNOSIS — E118 Type 2 diabetes mellitus with unspecified complications: Secondary | ICD-10-CM

## 2021-02-07 NOTE — Telephone Encounter (Signed)
Pts spouse called stating they need a formulary form filled out for patients FARXIGA 10 MG TABS tablet.Thanks.

## 2021-02-08 ENCOUNTER — Encounter: Payer: Self-pay | Admitting: Gastroenterology

## 2021-02-11 ENCOUNTER — Other Ambulatory Visit: Payer: Self-pay | Admitting: Internal Medicine

## 2021-02-11 DIAGNOSIS — E118 Type 2 diabetes mellitus with unspecified complications: Secondary | ICD-10-CM

## 2021-02-11 MED ORDER — CANAGLIFLOZIN 300 MG PO TABS
300.0000 mg | ORAL_TABLET | Freq: Every day | ORAL | 1 refills | Status: DC
Start: 2021-02-11 — End: 2021-02-15

## 2021-02-13 ENCOUNTER — Telehealth: Payer: Self-pay | Admitting: Internal Medicine

## 2021-02-13 NOTE — Progress Notes (Signed)
  Chronic Care Management   Note  02/13/2021 Name: TRISTAIN DAILY MRN: [id number] DOB: May 29, 1951  SABA NEUMAN is a 70 y.o. year old male who is a primary care patient of [name], MD. I reached out to [name] by phone today in response to a referral sent by Mr. Jaymian Bogart Scalf's PCP, [name], MD.   Mr. Embleton was given information about Chronic Care Management services today including:  1. CCM service includes personalized support from designated clinical staff supervised by his physician, including individualized plan of care and coordination with other care providers 2. 24/7 contact phone numbers for assistance for urgent and routine care needs. 3. Service will only be billed when office clinical staff spend 20 minutes or more in a month to coordinate care. 4. Only one practitioner may furnish and bill the service in a calendar month. 5. The patient may stop CCM services at any time (effective at the end of the month) by phone call to the office staff.   Patient wishes to consider information provided and/or speak with a member of the care team before deciding about enrollment in care management services.   Follow up plan:   Carley Perdue [employer] Scheduler

## 2021-02-14 ENCOUNTER — Encounter: Payer: Self-pay | Admitting: Internal Medicine

## 2021-02-15 ENCOUNTER — Other Ambulatory Visit: Payer: Self-pay | Admitting: Internal Medicine

## 2021-02-18 ENCOUNTER — Encounter: Payer: Self-pay | Admitting: Podiatry

## 2021-02-18 ENCOUNTER — Other Ambulatory Visit: Payer: Self-pay

## 2021-02-18 ENCOUNTER — Ambulatory Visit (INDEPENDENT_AMBULATORY_CARE_PROVIDER_SITE_OTHER): Payer: PPO | Admitting: Podiatry

## 2021-02-18 DIAGNOSIS — M79609 Pain in unspecified limb: Secondary | ICD-10-CM | POA: Diagnosis not present

## 2021-02-18 DIAGNOSIS — Z9181 History of falling: Secondary | ICD-10-CM | POA: Diagnosis not present

## 2021-02-18 DIAGNOSIS — E1142 Type 2 diabetes mellitus with diabetic polyneuropathy: Secondary | ICD-10-CM

## 2021-02-18 DIAGNOSIS — B351 Tinea unguium: Secondary | ICD-10-CM

## 2021-02-18 DIAGNOSIS — R0989 Other specified symptoms and signs involving the circulatory and respiratory systems: Secondary | ICD-10-CM

## 2021-02-18 NOTE — Progress Notes (Signed)
Subjective: [name] presents today at risk foot care. Pt has h/o NIDDM with chronic kidney disease and painful mycotic nails b/l that are difficult to trim. Pain interferes with ambulation. Aggravating factors include wearing enclosed shoe gear. Pain is relieved with periodic professional debridement.  His wife is present during today's visit. She has questions about spots on the bottom of his feet. She also states he has been falling lately. He has two wounds on both knees that are healing. She states he has a brace and cannot use it.  [name], MD is patient's PCP. Last visit was: 02/11/2021.  Allergies  Allergen Reactions  . Furosemide     pancreatitis  . Olmesartan Other (See Comments)    hyperkalemia  . Amlodipine Swelling    LE EDEMA  . Diamox [Acetazolamide] Itching    hives  . Lisinopril Rash    Objective: [name] is a pleasant 70 y.o.  African American male, morbidly obese in NAD. AAO x 3.   There were no vitals filed for this visit.  Vascular Examination: Capillary fill time to digits <3 seconds b/l lower extremities. Palpable DP pulse(s) b/l lower extremities Nonpalpable PT pulse(s) b/l lower extremities. Pedal hair absent. Lower extremity skin temperature gradient within normal limits. No pain with calf compression b/l.  Dermatological Examination: Pedal skin with normal turgor, texture and tone bilaterally. No open wounds bilaterally. No interdigital macerations bilaterally. Toenails 1-5 b/l elongated, discolored, dystrophic, thickened, crumbly with subungual debris and tenderness to dorsal palpation.   Multiple areas of melanin deposits noted on plantar aspect of both feet.  Musculoskeletal: Normal muscle strength 5/5 to all lower extremity muscle groups of LLE.  No pain crepitus or joint limitation noted with ROM b/l. No gross bony deformities bilaterally.  Neurological Examination: Pt has subjective symptoms of neuropathy. Protective sensation  intact 5/5 intact bilaterally with 10g monofilament b/l. Vibratory sensation intact b/l. Proprioception intact bilaterally. Dropfoot noted right lower extremity. Babinski reflex negative b/l. Clonus negative b/l.   Assessment: 1. Pain due to onychomycosis of nail   2. Personal history of fall   3. Diminished pulses in lower extremity   4. Diabetic peripheral neuropathy associated with type 2 diabetes mellitus ([place])    Plan: -Examined patient. -No new findings. No new orders. -Continue diabetic foot care principles. -Toenails 1-5 b/l were debrided in length and girth with sterile nail nippers and dremel without iatrogenic bleeding.  -Patient to report any pedal injuries to medical professional immediately. -Reassured patient regarding melanin deposits on bottom of both feet are no cause for alarm. Will monitor for any changes. -Regarding falls, I recommended using walker or wheelchair in home as opposed to cane. Also suggested he may qualify for PT after his fall. They will discuss with PCP.  -Last ABIs 2019. Ordered ABIs and segmental pressures. Rx was given for her to take to Dr. Kingsley Plan office. -Patient/POA to call should there be question/concern in the interim.  Return in about 3 months (around 05/18/2021).  Marzetta Board, DPM

## 2021-02-20 ENCOUNTER — Other Ambulatory Visit: Payer: Self-pay | Admitting: Internal Medicine

## 2021-02-20 DIAGNOSIS — E118 Type 2 diabetes mellitus with unspecified complications: Secondary | ICD-10-CM

## 2021-02-23 ENCOUNTER — Other Ambulatory Visit: Payer: Self-pay | Admitting: Internal Medicine

## 2021-02-23 DIAGNOSIS — M5416 Radiculopathy, lumbar region: Secondary | ICD-10-CM

## 2021-02-23 DIAGNOSIS — M961 Postlaminectomy syndrome, not elsewhere classified: Secondary | ICD-10-CM

## 2021-02-23 DIAGNOSIS — E0849 Diabetes mellitus due to underlying condition with other diabetic neurological complication: Secondary | ICD-10-CM

## 2021-02-28 ENCOUNTER — Other Ambulatory Visit: Payer: Self-pay | Admitting: Internal Medicine

## 2021-02-28 DIAGNOSIS — E118 Type 2 diabetes mellitus with unspecified complications: Secondary | ICD-10-CM

## 2021-02-28 DIAGNOSIS — Z794 Long term (current) use of insulin: Secondary | ICD-10-CM

## 2021-03-01 DIAGNOSIS — G4733 Obstructive sleep apnea (adult) (pediatric): Secondary | ICD-10-CM | POA: Diagnosis not present

## 2021-03-04 ENCOUNTER — Ambulatory Visit (INDEPENDENT_AMBULATORY_CARE_PROVIDER_SITE_OTHER): Payer: PPO

## 2021-03-04 DIAGNOSIS — Z0000 Encounter for general adult medical examination without abnormal findings: Secondary | ICD-10-CM

## 2021-03-04 NOTE — Patient Instructions (Signed)
[name] , Thank you for taking time to come for your Medicare Wellness Visit. I appreciate your ongoing commitment to your health goals. Please review the following plan we discussed and let me know if I can assist you in the future.   Screening recommendations/referrals: Colonoscopy: 01/29/2021; due every 5 years Recommended yearly ophthalmology/optometry visit for glaucoma screening and checkup Recommended yearly dental visit for hygiene and checkup  Vaccinations: Influenza vaccine: 11/08/2020 Pneumococcal vaccine: 10/25/2014, 02/08/2020 Tdap vaccine: 05/08/2020; due every 10 years Shingles vaccine: never done; can check with local pharmacy    Covid-19: 04/06/2020, 04/27/2020, 01/14/2021  Advanced directives: Advance directive discussed with you today. Even though you declined this today please call our office should you change your mind and we can give you the proper paperwork for you to fill out.  Conditions/risks identified: Yes. Reviewed health maintenance screenings with patient today and relevant education, vaccines, and/or referrals were provided. Continue doing brain stimulating activities (puzzles, reading, adult coloring books, staying active) to keep memory sharp. Continue to eat heart healthy diet (full of fruits, vegetables, whole grains, lean protein, water--limit salt, fat, and sugar intake) and increase physical activity as tolerated.  Next appointment: Please schedule your next Medicare Wellness Visit with your Nurse Health Advisor in 1 year by calling [phone].  Preventive Care 46 Years and Older, Male Preventive care refers to lifestyle choices and visits with your health care provider that can promote health and wellness. What does preventive care include?  A yearly physical exam. This is also called an annual well check.  Dental exams once or twice a year.  Routine eye exams. Ask your health care provider how often you should have your eyes checked.  Personal  lifestyle choices, including:  Daily care of your teeth and gums.  Regular physical activity.  Eating a healthy diet.  Avoiding tobacco and drug use.  Limiting alcohol use.  Practicing safe sex.  Taking low doses of aspirin every day.  Taking vitamin and mineral supplements as recommended by your health care provider. What happens during an annual well check? The services and screenings done by your health care provider during your annual well check will depend on your age, overall health, lifestyle risk factors, and family history of disease. Counseling  Your health care provider may ask you questions about your:  Alcohol use.  Tobacco use.  Drug use.  Emotional well-being.  Home and relationship well-being.  Sexual activity.  Eating habits.  History of falls.  Memory and ability to understand (cognition).  Work and work Statistician. Screening  You may have the following tests or measurements:  Height, weight, and BMI.  Blood pressure.  Lipid and cholesterol levels. These may be checked every 5 years, or more frequently if you are over 48 years old.  Skin check.  Lung cancer screening. You may have this screening every year starting at age 43 if you have a 30-pack-year history of smoking and currently smoke or have quit within the past 15 years.  Fecal occult blood test (FOBT) of the stool. You may have this test every year starting at age 72.  Flexible sigmoidoscopy or colonoscopy. You may have a sigmoidoscopy every 5 years or a colonoscopy every 10 years starting at age 38.  Prostate cancer screening. Recommendations will vary depending on your family history and other risks.  Hepatitis C blood test.  Hepatitis B blood test.  Sexually transmitted disease (STD) testing.  Diabetes screening. This is done by checking your blood sugar (glucose)  after you have not eaten for a while (fasting). You may have this done every 1-3 years.  Abdominal aortic  aneurysm (AAA) screening. You may need this if you are a current or former smoker.  Osteoporosis. You may be screened starting at age 69 if you are at high risk. Talk with your health care provider about your test results, treatment options, and if necessary, the need for more tests. Vaccines  Your health care provider may recommend certain vaccines, such as:  Influenza vaccine. This is recommended every year.  Tetanus, diphtheria, and acellular pertussis (Tdap, Td) vaccine. You may need a Td booster every 10 years.  Zoster vaccine. You may need this after age 38.  Pneumococcal 13-valent conjugate (PCV13) vaccine. One dose is recommended after age 55.  Pneumococcal polysaccharide (PPSV23) vaccine. One dose is recommended after age 63. Talk to your health care provider about which screenings and vaccines you need and how often you need them. This information is not intended to replace advice given to you by your health care provider. Make sure you discuss any questions you have with your health care provider. Document Released: 01/04/2016 Document Revised: 08/27/2016 Document Reviewed: 10/09/2015 Elsevier Interactive Patient Education  2017 [place] Prevention in the Home Falls can cause injuries. They can happen to people of all ages. There are many things you can do to make your home safe and to help prevent falls. What can I do on the outside of my home?  Regularly fix the edges of walkways and driveways and fix any cracks.  Remove anything that might make you trip as you walk through a door, such as a raised step or threshold.  Trim any bushes or trees on the path to your home.  Use bright outdoor lighting.  Clear any walking paths of anything that might make someone trip, such as rocks or tools.  Regularly check to see if handrails are loose or broken. Make sure that both sides of any steps have handrails.  Any raised decks and porches should have guardrails on  the edges.  Have any leaves, snow, or ice cleared regularly.  Use sand or salt on walking paths during winter.  Clean up any spills in your garage right away. This includes oil or grease spills. What can I do in the bathroom?  Use night lights.  Install grab bars by the toilet and in the tub and shower. Do not use towel bars as grab bars.  Use non-skid mats or decals in the tub or shower.  If you need to sit down in the shower, use a plastic, non-slip stool.  Keep the floor dry. Clean up any water that spills on the floor as soon as it happens.  Remove soap buildup in the tub or shower regularly.  Attach bath mats securely with double-sided non-slip rug tape.  Do not have throw rugs and other things on the floor that can make you trip. What can I do in the bedroom?  Use night lights.  Make sure that you have a light by your bed that is easy to reach.  Do not use any sheets or blankets that are too big for your bed. They should not hang down onto the floor.  Have a firm chair that has side arms. You can use this for support while you get dressed.  Do not have throw rugs and other things on the floor that can make you trip. What can I do in the kitchen?  Clean up any spills right away.  Avoid walking on wet floors.  Keep items that you use a lot in easy-to-reach places.  If you need to reach something above you, use a strong step stool that has a grab bar.  Keep electrical cords out of the way.  Do not use floor polish or wax that makes floors slippery. If you must use wax, use non-skid floor wax.  Do not have throw rugs and other things on the floor that can make you trip. What can I do with my stairs?  Do not leave any items on the stairs.  Make sure that there are handrails on both sides of the stairs and use them. Fix handrails that are broken or loose. Make sure that handrails are as long as the stairways.  Check any carpeting to make sure that it is firmly  attached to the stairs. Fix any carpet that is loose or worn.  Avoid having throw rugs at the top or bottom of the stairs. If you do have throw rugs, attach them to the floor with carpet tape.  Make sure that you have a light switch at the top of the stairs and the bottom of the stairs. If you do not have them, ask someone to add them for you. What else can I do to help prevent falls?  Wear shoes that:  Do not have high heels.  Have rubber bottoms.  Are comfortable and fit you well.  Are closed at the toe. Do not wear sandals.  If you use a stepladder:  Make sure that it is fully opened. Do not climb a closed stepladder.  Make sure that both sides of the stepladder are locked into place.  Ask someone to hold it for you, if possible.  Clearly mark and make sure that you can see:  Any grab bars or handrails.  First and last steps.  Where the edge of each step is.  Use tools that help you move around (mobility aids) if they are needed. These include:  Canes.  Walkers.  Scooters.  Crutches.  Turn on the lights when you go into a dark area. Replace any light bulbs as soon as they burn out.  Set up your furniture so you have a clear path. Avoid moving your furniture around.  If any of your floors are uneven, fix them.  If there are any pets around you, be aware of where they are.  Review your medicines with your doctor. Some medicines can make you feel dizzy. This can increase your chance of falling. Ask your doctor what other things that you can do to help prevent falls. This information is not intended to replace advice given to you by your health care provider. Make sure you discuss any questions you have with your health care provider. Document Released: 10/04/2009 Document Revised: 05/15/2016 Document Reviewed: 01/12/2015 Elsevier Interactive Patient Education  2017 [employer].

## 2021-03-04 NOTE — Progress Notes (Signed)
I connected with [facility] today by telephone and verified that I am speaking with the correct person using two identifiers. Location patient: home Location provider: work Persons participating in the virtual visit: [place], [place] (wife, per DPR) and Lisette Abu, LPN.   I discussed the limitations, risks, security and privacy concerns of performing an evaluation and management service by telephone and the availability of in person appointments. I also discussed with the patient that there may be a patient responsible charge related to this service. The patient expressed understanding and verbally consented to this telephonic visit.    Interactive audio and video telecommunications were attempted between this provider and patient, however failed, due to patient having technical difficulties OR patient did not have access to video capability.  We continued and completed visit with audio only.  Some vital signs may be absent or patient reported.   Time Spent with patient on telephone encounter: 45 minutes  Subjective:   [name] is a 70 y.o. male who presents for Medicare Annual/Subsequent preventive examination.  Review of Systems    No ROS. Medicare Wellness Virtual Visit. Additional risk factors are reflected in social history. Cardiac Risk Factors include: advanced age (>14mn, >>23women);diabetes mellitus;dyslipidemia;family history of premature cardiovascular disease;hypertension;male gender;obesity (BMI >30kg/m2)     Objective:    There were no vitals filed for this visit. There is no height or weight on file to calculate BMI.  Advanced Directives 03/04/2021 07/13/2019 06/09/2019 05/11/2018 07/09/2017 07/09/2017 07/26/2016  Does Patient Have a Medical Advance Directive? _0  No No  Type of Advance Directive - - - - - - -  Does patient want to make changes to medical advance directive? - - - Yes (ED - Information included in AVS) - - -  [place] of  [place] - - - - - - -  Would patient like information on creating a medical advance directive? No - Patient declined No - Patient declined No - Patient declined - Yes (Inpatient - patient requests chaplain consult to create a medical advance directive) - -  Pre-existing out of facility DNR order (yellow form or pink MOST form) - - - - - - -    Current Medications (verified) Outpatient Encounter Medications as of 03/04/2021  Medication Sig  . aspirin 81 MG EC tablet Take 1 tablet (81 mg total) by mouth daily. Swallow whole.  .Marland Kitchenatorvastatin (LIPITOR) 80 MG tablet TAKE 1 TABLET (80 MG TOTAL) BY MOUTH DAILY AT 6 PM.  . Blood Glucose Monitoring Suppl (ACCU-CHEK GUIDE ME) w/Device KIT 1 Act by Does not apply route 3 (three) times daily with meals.  . carvedilol (COREG) 12.5 MG tablet TAKE 1 TABLET BY MOUTH TWICE A DAY WITH A MEAL  . Cholecalciferol (VITAMIN D3) 25 MCG (1000 UT) CAPS Take by mouth.  . Continuous Blood Gluc Receiver (FREESTYLE LIBRE 14 DAY READER) DEVI APPLY AS DIRECTED  . Continuous Blood Gluc Sensor (FREESTYLE LIBRE 14 DAY SENSOR) MISC USE 1 SENSOR EVERY 14 DAYS, THEN REPLACE WITH NEW ONE.  . fluticasone (FLONASE) 50 MCG/ACT nasal spray SPRAY 2 SPRAYS INTO EACH NOSTRIL EVERY DAY  . gabapentin (NEURONTIN) 300 MG capsule TAKE 1 CAPSULE BY MOUTH THREE TIMES A DAY  . glucose blood (ONETOUCH VERIO) test strip Use BID to check BS. DX E11.9  . indapamide (LOZOL) 1.25 MG tablet Take 1.25 mg by mouth daily.  . Insulin Pen Needle (PEN NEEDLES) 32G X 4  MM MISC Use as directed to inject insulin daily. DX: E11.9  . isosorbide mononitrate (IMDUR) 120 MG 24 hr tablet TAKE 1 TABLET BY MOUTH EVERY DAY  . levocetirizine (XYZAL) 5 MG tablet TAKE 1 TABLET BY MOUTH EVERY DAY IN THE EVENING  . metFORMIN (GLUCOPHAGE) 500 MG tablet TAKE 1 TABLET (500 MG TOTAL) BY MOUTH 2 (TWO) TIMES DAILY WITH A MEAL.  Marland Kitchen NEXLIZET 180-10 MG TABS TAKE 1 TABLET BY MOUTH DAILY  . omega-3 acid  ethyl esters (LOVAZA) 1 g capsule TAKE 2 CAPSULES BY MOUTH 2 TIMES DAILY  . pantoprazole (PROTONIX) 40 MG tablet TAKE 1 TABLET BY MOUTH TWICE A DAY  . ranolazine (RANEXA) 500 MG 12 hr tablet TAKE 1 TABLET BY MOUTH TWICE A DAY  . SOLIQUA 100-33 UNT-MCG/ML SOPN INJECT 60 UNITS INTO THE SKIN DAILY.  Marland Kitchen SUPREP BOWEL PREP KIT 17.5-3.13-1.6 GM/177ML SOLN See admin instructions.  . tamsulosin (FLOMAX) 0.4 MG CAPS capsule Take 0.4 mg by mouth once.  . traMADol (ULTRAM) 50 MG tablet TAKE 1 TABLET BY MOUTH EVERY 8 HOURS AS NEEDED.   No facility-administered encounter medications on file as of 03/04/2021.    Allergies (verified) Furosemide, Olmesartan, Amlodipine, Diamox [acetazolamide], and Lisinopril   History: Past Medical History:  Diagnosis Date  . Arthritis    "mild arthritis in hip"  . CAD (coronary artery disease)   . CHF (congestive heart failure) ([place])   . Complication of anesthesia    "lungs filled up with fluid"   . Diabetes mellitus    Type II  . Glaucoma   . Heart murmur   . HTN (hypertension)   . Hypercholesterolemia   . LBP (low back pain)   . Legally blind   . Peripheral neuropathy   . Sleep apnea    c-pap nightly  . Staph infection    "from back surgery"  . Status post insertion of drug eluting coronary artery stent   . Trigger finger   . Ulcer   . Wears glasses    "to protect cornea" - legally blind   Past Surgical History:  Procedure Laterality Date  . CHOLECYSTECTOMY  09/15/2012   Procedure: LAPAROSCOPIC CHOLECYSTECTOMY WITH INTRAOPERATIVE CHOLANGIOGRAM;  Surgeon: Pedro Earls, MD;  Location: [place];  Service: General;  Laterality: N/A;  . COLONOSCOPY    . EYE SURGERY Bilateral    cataracts  . INGUINAL HERNIA REPAIR     right  . LUMBAR FUSION    . LUMBAR LAMINECTOMY/DECOMPRESSION MICRODISCECTOMY N/A 10/26/2015   Procedure: RIGHT AND CENTRAL LUMBAR LAMINECTOMY L3-4, RIGHT L5-S1 LATERAL RECESS DECOMPRESSION;  Surgeon: Jessy Oto, MD;  Location: [place];   Service: Orthopedics;  Laterality: N/A;  . NECK SURGERY    . peptic ulcer dz surgery  pt was in his 20s   bleeding ulcer.   . Prosthetic Cornea placement, Right eye  2007   [place] Spare Right 10/26/2015   Procedure: RELEASE TRIGGER FINGER RIGHT THUMB;  Surgeon: Jessy Oto, MD;  Location: [place];  Service: Orthopedics;  Laterality: Right;   Family History  Problem Relation Age of Onset  . Breast cancer Mother   . Colon cancer Mother   . Hypertension Mother   . Diabetes Mother   . Hypertension Father   . Colon cancer Other        Elevated Risk for  . Diabetes Sister   . Diabetes Brother   . Esophageal cancer Neg Hx   . Stomach cancer  Neg Hx   . Rectal cancer Neg Hx    Social History   Socioeconomic History  . Marital status: Married    Spouse name: [name]  . Number of children: 3  . Years of education: 49  . Highest education level: Not on file  Occupational History  . Occupation: disabled    Comment: blind  Tobacco Use  . Smoking status: Never Smoker  . Smokeless tobacco: Never Used  Vaping Use  . Vaping Use: Never used  Substance and Sexual Activity  . Alcohol use: No  . Drug use: No  . Sexual activity: Not Currently  Other Topics Concern  . Not on file  Social History Narrative   Occupation: disabled, blind   Married   Regular Exercise-no   Lives at home with his wife.   Right-handed.   2-3 cups caffeine per day.   Social Determinants of Health   Financial Resource Strain: Low Risk   . Difficulty of Paying Living Expenses: Not hard at all  Food Insecurity: No Food Insecurity  . Worried About Charity fundraiser in the Last Year: Never true  . Ran Out of Food in the Last Year: Never true  Transportation Needs: Unmet Transportation Needs  . Lack of Transportation (Medical): Yes  . Lack of Transportation (Non-Medical): Yes  Physical Activity: Inactive  . Days of Exercise per Week: 0 days  . Minutes of Exercise per Session:  0 min  Stress: No Stress Concern Present  . Feeling of Stress : Not at all  Social Connections: Socially Integrated  . Frequency of Communication with Friends and Family: More than three times a week  . Frequency of Social Gatherings with Friends and Family: Once a week  . Attends Religious Services: 1 to 4 times per year  . Active Member of Clubs or Organizations: No  . Attends Archivist Meetings: 1 to 4 times per year  . Marital Status: Married    Tobacco Counseling Counseling given: Not Answered   Clinical Intake:  Pre-visit preparation completed: Yes  Pain : No/denies pain     Nutritional Risks: None Diabetes: Yes CBG done?: No Did pt. bring in CBG monitor from home?: No  How often do you need to have someone help you when you read instructions, pamphlets, or other written materials from your doctor or pharmacy?: 1 - Never What is the last grade level you completed in school?: High School Graduate  Diabetic? yes  Interpreter Needed?: No  Information entered by :: Lisette Abu, LPN   Activities of Daily Living In your present state of health, do you have any difficulty performing the following activities: 03/04/2021 11/08/2020  Hearing? N N  Vision? Y N  Comment legally blind -  Difficulty concentrating or making decisions? Y N  Comment issue with memory -  Walking or climbing stairs? Y N  Dressing or bathing? Y N  Doing errands, shopping? Y N  Preparing Food and eating ? Y -  Using the Toilet? Y -  In the past six months, have you accidently leaked urine? N -  Do you have problems with loss of bowel control? N -  Managing your Medications? Y -  Managing your Finances? N -  Housekeeping or managing your Housekeeping? Y -  Some recent data might be hidden    Patient Care Team: [name], MD as PCP - General Jerline Pain, MD as PCP - Cardiology (Cardiology) Star Age, MD as Attending Physician (Neurology)  Gardiner Barefoot, DPM as  Consulting Physician (Podiatry)  Indicate any recent Medical Services you may have received from other than [facility] providers in the past year (date may be approximate).     Assessment:   This is a routine wellness examination for [place].  Hearing/Vision screen No exam data present  Dietary issues and exercise activities discussed: Current Exercise Habits: The patient does not participate in regular exercise at present, Exercise limited by: cardiac condition(s);orthopedic condition(s);neurologic condition(s);Other - see comments (vision conditions)  Goals    . Patient Stated     I want to do more things out of the house when the car is fixed like going to church, visiting friends and enjoying life.       Depression Screen PHQ 2/9 Scores 03/04/2021 11/08/2020 07/13/2019 06/09/2019 02/24/2019 05/11/2018 11/24/2017  PHQ - 2 Score 0 0 2 2 0 1 0  PHQ- 9 Score - - - 6 - - -    Fall Risk Fall Risk  03/04/2021 11/08/2020 08/03/2019 07/13/2019 02/28/2019  Falls in the past year? 1 0 0 0 0  Number falls in past yr: 1 - 0 - -  Injury with Fall? 0 - 0 - -  Risk for fall due to : Impaired balance/gait;Impaired vision;History of fall(s) - Impaired vision - Impaired balance/gait;Impaired vision;Impaired mobility  Follow up Falls evaluation completed - Falls evaluation completed;Education provided - Falls evaluation completed;Education provided;Falls prevention discussed    FALL RISK PREVENTION PERTAINING TO THE HOME:  Any stairs in or around the home? No  If so, are there any without handrails? No  Home free of loose throw rugs in walkways, pet beds, electrical cords, etc? Yes  Adequate lighting in your home to reduce risk of falls? Yes   ASSISTIVE DEVICES UTILIZED TO PREVENT FALLS:  Life alert? No  Use of a cane, walker or w/c? Yes  Grab bars in the bathroom? Yes  Shower chair or bench in shower? Yes  Elevated toilet seat or a handicapped toilet? Yes   TIMED UP AND GO:  Was the test performed? No  .  Length of time to ambulate 10 feet: 0 sec.   Gait slow and steady with assistive device  Cognitive Function: MMSE - Mini Mental State Exam 05/11/2018  Not completed: Unable to complete        Immunizations Immunization History  Administered Date(s) Administered  . Fluad Quad(high Dose 65+) 02/08/2020, 11/08/2020  . Influenza Split 09/12/2012  . Influenza Whole 10/28/2010, 10/28/2011  . Influenza, High Dose Seasonal PF 08/25/2013, 08/21/2015, 12/09/2016, 02/24/2019  . Influenza, Quadrivalent, Recombinant, Inj, Pf 11/04/2017  . Influenza,inj,Quad PF,6+ Mos 10/25/2014  . PFIZER(Purple Top)SARS-COV-2 Vaccination 04/06/2020, 04/27/2020, 01/14/2021  . Pneumococcal Conjugate-13 10/25/2014  . Pneumococcal Polysaccharide-23 10/28/2010, 09/13/2012, 02/08/2020  . Pneumococcal-Unspecified 07/22/2014  . Td 10/22/2010  . Tdap 05/08/2020    TDAP status: Up to date  Flu Vaccine status: Up to date  Pneumococcal vaccine status: Up to date  Covid-19 vaccine status: Completed vaccines  Qualifies for Shingles Vaccine? Yes   Zostavax completed No   Shingrix Completed?: No.    Education has been provided regarding the importance of this vaccine. Patient has been advised to call insurance company to determine out of pocket expense if they have not yet received this vaccine. Advised may also receive vaccine at local pharmacy or Health Dept. Verbalized acceptance and understanding.  Screening Tests Health Maintenance  Topic Date Due  . FOOT EXAM  02/07/2021  . URINE MICROALBUMIN  02/07/2021  .  HEMOGLOBIN A1C  05/08/2021  . COVID-19 Vaccine (4 - Booster for Pfizer series) 07/14/2021  . COLONOSCOPY (Pts 45-76yr Insurance coverage will need to be confirmed)  01/29/2026  . TETANUS/TDAP  05/08/2030  . INFLUENZA VACCINE  Completed  . Hepatitis C Screening  Completed  . PNA vac Low Risk Adult  Completed  . HPV VACCINES  Aged Out    Health Maintenance  Health Maintenance Due  Topic Date  Due  . FOOT EXAM  02/07/2021  . URINE MICROALBUMIN  02/07/2021    Colorectal cancer screening: Type of screening: Colonoscopy. Completed 01/29/2021. Repeat every 5 years  Lung Cancer Screening: (Low Dose CT Chest recommended if Age 72-80years, 30 pack-year currently smoking OR have quit w/in 15years.) does not qualify.   Lung Cancer Screening Referral: no  Additional Screening:  Hepatitis C Screening: does qualify; Completed yes  Vision Screening: Recommended annual ophthalmology exams for early detection of glaucoma and other disorders of the eye. Is the patient up to date with their annual eye exam?  Yes  Who is the provider or what is the name of the office in which the patient attends annual eye exams? [facility] not [facility] with a provider, would they like to be referred to a provider to establish care? No .   Dental Screening: Recommended annual dental exams for proper oral hygiene  Community Resource Referral / Chronic Care Management: CRR required this visit?  Yes   CCM required this visit?  Yes      Plan:     I have personally reviewed and noted the following in the patient's chart:   . Medical and social history . Use of alcohol, tobacco or illicit drugs  . Current medications and supplements . Functional ability and status . Nutritional status . Physical activity . Advanced directives . List of other physicians . Hospitalizations, surgeries, and ER visits in previous 12 months . Vitals . Screenings to include cognitive, depression, and falls . Referrals and appointments  In addition, I have reviewed and discussed with patient certain preventive protocols, quality metrics, and best practice recommendations. A written personalized care plan for preventive services as well as general preventive health recommendations were provided to patient.     SSheral Flow LPN   [id number]  Nurse Notes:  Referral for: [facility] Transportation Services: Transportation Needs (Patient does not have adequate transportation to get to and from scheduled appointments). Patient is cogitatively intact. There were no vitals filed for this visit. There is no height or weight on file to calculate BMI. Medications reviewed with patient; no opioid use noted.

## 2021-03-05 ENCOUNTER — Encounter: Payer: Self-pay | Admitting: Internal Medicine

## 2021-03-05 ENCOUNTER — Other Ambulatory Visit: Payer: Self-pay | Admitting: Internal Medicine

## 2021-03-05 DIAGNOSIS — E118 Type 2 diabetes mellitus with unspecified complications: Secondary | ICD-10-CM

## 2021-03-05 DIAGNOSIS — I5042 Chronic combined systolic (congestive) and diastolic (congestive) heart failure: Secondary | ICD-10-CM

## 2021-03-05 MED ORDER — EMPAGLIFLOZIN 10 MG PO TABS: 10.0000 mg | ORAL_TABLET | Freq: Every day | ORAL | 0 refills | Status: DC

## 2021-03-07 ENCOUNTER — Other Ambulatory Visit: Payer: Self-pay | Admitting: Internal Medicine

## 2021-03-07 DIAGNOSIS — R4181 Age-related cognitive decline: Secondary | ICD-10-CM

## 2021-03-12 ENCOUNTER — Telehealth: Payer: Self-pay

## 2021-03-12 DIAGNOSIS — M48062 Spinal stenosis, lumbar region with neurogenic claudication: Secondary | ICD-10-CM

## 2021-03-12 NOTE — Telephone Encounter (Signed)
DME order has been mailed to the home.

## 2021-03-12 NOTE — Telephone Encounter (Signed)
-----   Message from [name], MD sent at 03/07/2021  9:37 AM EDT ----- Ca we do DME orders for these?  TJ  ----- Message ----- From: Sheral Flow, LPN Sent: 0/08/3817   4:56 PM EDT To: [name], MD  Patient's wife is wanting a better explanation of her husband dx of Chronic Kidney Disease III B.  She also is asking why was her husband taken off of [place] and not given anything else to replace those drugs.  She does not want her husband to end up with CKD IV or on Dialysis.  Also the patient stated that he is having some memory issues and would like to be given something for that as well.  Had a lot of complaints, especially from the wife.  She is also requesting a rx for Home Health PT to help with strengthening and unsteady gait and she also wants a rx for shower bench so that the insurance will pay for it. He's not sleeping well either.  Should he just come in or what do you suggest?  [facility]

## 2021-03-20 DIAGNOSIS — E161 Other hypoglycemia: Secondary | ICD-10-CM | POA: Diagnosis not present

## 2021-03-20 DIAGNOSIS — R410 Disorientation, unspecified: Secondary | ICD-10-CM | POA: Diagnosis not present

## 2021-03-20 DIAGNOSIS — R110 Nausea: Secondary | ICD-10-CM | POA: Diagnosis not present

## 2021-03-20 DIAGNOSIS — E162 Hypoglycemia, unspecified: Secondary | ICD-10-CM | POA: Diagnosis not present

## 2021-03-20 DIAGNOSIS — R404 Transient alteration of awareness: Secondary | ICD-10-CM | POA: Diagnosis not present

## 2021-04-01 ENCOUNTER — Other Ambulatory Visit: Payer: Self-pay | Admitting: Radiology

## 2021-04-01 MED ORDER — GABAPENTIN 300 MG PO CAPS
ORAL_CAPSULE | ORAL | 2 refills | Status: DC
Start: 2021-04-01 — End: 2021-11-28

## 2021-04-02 ENCOUNTER — Other Ambulatory Visit: Payer: Self-pay | Admitting: Internal Medicine

## 2021-04-02 DIAGNOSIS — I5042 Chronic combined systolic (congestive) and diastolic (congestive) heart failure: Secondary | ICD-10-CM

## 2021-04-02 DIAGNOSIS — E118 Type 2 diabetes mellitus with unspecified complications: Secondary | ICD-10-CM

## 2021-04-02 MED ORDER — EMPAGLIFLOZIN 25 MG PO TABS: 25.0000 mg | ORAL_TABLET | Freq: Every day | ORAL | 1 refills | Status: DC

## 2021-04-18 ENCOUNTER — Telehealth: Payer: Self-pay | Admitting: Internal Medicine

## 2021-04-18 NOTE — Progress Notes (Signed)
  Chronic Care Management   Note  04/18/2021 Name: [name] MRN: [id number] DOB: 09/14/1951  [name] is a 70 y.o. year old male who is a primary care patient of [name], MD. I reached out to [name] by phone today in response to a referral sent by [name]'s PCP, [name], MD.   [name] was given information about Chronic Care Management services today including:  1. CCM service includes personalized support from designated clinical staff supervised by his physician, including individualized plan of care and coordination with other care providers 2. 24/7 contact phone numbers for assistance for urgent and routine care needs. 3. Service will only be billed when office clinical staff spend 20 minutes or more in a month to coordinate care. 4. Only one practitioner may furnish and bill the service in a calendar month. 5. The patient may stop CCM services at any time (effective at the end of the month) by phone call to the office staff.   Patient agreed to services and verbal consent obtained.   Follow up plan:   [name] [employer] Scheduler

## 2021-04-23 ENCOUNTER — Other Ambulatory Visit: Payer: Self-pay | Admitting: Internal Medicine

## 2021-04-23 DIAGNOSIS — E785 Hyperlipidemia, unspecified: Secondary | ICD-10-CM

## 2021-04-23 DIAGNOSIS — I2510 Atherosclerotic heart disease of native coronary artery without angina pectoris: Secondary | ICD-10-CM

## 2021-04-25 ENCOUNTER — Other Ambulatory Visit: Payer: Self-pay | Admitting: Internal Medicine

## 2021-04-25 ENCOUNTER — Telehealth (INDEPENDENT_AMBULATORY_CARE_PROVIDER_SITE_OTHER): Payer: PPO | Admitting: Family Medicine

## 2021-04-25 ENCOUNTER — Other Ambulatory Visit (HOSPITAL_COMMUNITY): Payer: Self-pay

## 2021-04-25 ENCOUNTER — Telehealth: Payer: Self-pay | Admitting: *Deleted

## 2021-04-25 DIAGNOSIS — U071 COVID-19: Secondary | ICD-10-CM

## 2021-04-25 DIAGNOSIS — E118 Type 2 diabetes mellitus with unspecified complications: Secondary | ICD-10-CM

## 2021-04-25 DIAGNOSIS — I5042 Chronic combined systolic (congestive) and diastolic (congestive) heart failure: Secondary | ICD-10-CM

## 2021-04-25 MED ORDER — MOLNUPIRAVIR EUA 200MG CAPSULE
4.0000 | ORAL_CAPSULE | Freq: Two times a day (BID) | ORAL | 0 refills | Status: AC
  Filled 2021-04-25: qty 40, 5d supply, fill #0

## 2021-04-25 MED ORDER — BENZONATATE 100 MG PO CAPS
100.0000 mg | ORAL_CAPSULE | Freq: Two times a day (BID) | ORAL | 0 refills | Status: DC | PRN
  Filled 2021-04-25: qty 20, 10d supply, fill #0

## 2021-04-25 NOTE — Progress Notes (Signed)
Virtual Visit via Video Note  I connected with [name]  on 04/25/21 at 10:00 AM EDT by a video enabled telemedicine application and verified that I am speaking with the correct person using two identifiers.  Location patient: home, [place] Location provider:work or home office Persons participating in the virtual visit: patient, provider  I discussed the limitations of evaluation and management by telemedicine and the availability of in person appointments. The patient expressed understanding and agreed to proceed.   HPI:  Acute telemedicine visit for COVID19: -Onset: 3-4 days ago -Symptoms include: chills, cough, headache, runny nose, subjective fever -Denies: CP, SOB, NVD, inability to eat/drink/get out of bed -his sister has covid currently and he was around her recently as is in hospital -Pertinent past medical history: CHF, HTN, DM, renal disease -Pertinent medication allergies: Allergies  Allergen Reactions  . Furosemide     pancreatitis  . Olmesartan Other (See Comments)    hyperkalemia  . Amlodipine Swelling    LE EDEMA  . Diamox [Acetazolamide] Itching    hives  . Lisinopril Rash    -COVID-19 vaccine status: vaccinated and boosted pfizer  ROS: See pertinent positives and negatives per HPI.  Past Medical History:  Diagnosis Date  . Arthritis    "mild arthritis in hip"  . CAD (coronary artery disease)   . CHF (congestive heart failure) (HCC)   . Complication of anesthesia    "lungs filled up with fluid"   . Diabetes mellitus    Type II  . Glaucoma   . Heart murmur   . HTN (hypertension)   . Hypercholesterolemia   . LBP (low back pain)   . Legally blind   . Peripheral neuropathy   . Sleep apnea    c-pap nightly  . Staph infection    "from back surgery"  . Status post insertion of drug eluting coronary artery stent   . Trigger finger   . Ulcer   . Wears glasses    "to protect cornea" - legally blind    Past Surgical History:  Procedure Laterality Date  .  CHOLECYSTECTOMY  09/15/2012   Procedure: LAPAROSCOPIC CHOLECYSTECTOMY WITH INTRAOPERATIVE CHOLANGIOGRAM;  Surgeon: Matthew B Martin, MD;  Location: [facility] OR;  Service: General;  Laterality: N/A;  . COLONOSCOPY    . EYE SURGERY Bilateral    cataracts  . INGUINAL HERNIA REPAIR     right  . LUMBAR FUSION    . LUMBAR LAMINECTOMY/DECOMPRESSION MICRODISCECTOMY N/A 10/26/2015   Procedure: RIGHT AND CENTRAL LUMBAR LAMINECTOMY L3-4, RIGHT L5-S1 LATERAL RECESS DECOMPRESSION;  Surgeon: James E Nitka, MD;  Location: [facility] OR;  Service: Orthopedics;  Laterality: N/A;  . NECK SURGERY    . peptic ulcer dz surgery  pt was in his 20s   bleeding ulcer.   . Prosthetic Cornea placement, Right eye  2007   [facility]  . TRIGGER FINGER RELEASE Right 10/26/2015   Procedure: RELEASE TRIGGER FINGER RIGHT THUMB;  Surgeon: James E Nitka, MD;  Location: [facility] OR;  Service: Orthopedics;  Laterality: Right;     Current Outpatient Medications:  .  benzonatate (TESSALON) 100 MG capsule, Take 1 capsule (100 mg total) by mouth 2 (two) times daily as needed for cough., Disp: 20 capsule, Rfl: 0 .  empagliflozin (JARDIANCE) 25 MG TABS tablet, Take 1 tablet (25 mg total) by mouth daily., Disp: 90 tablet, Rfl: 1 .  molnupiravir EUA 200 mg CAPS, Take 4 capsules (800 mg total) by mouth 2 (two) times daily for 5 days., Disp:   40 capsule, Rfl: 0 .  aspirin 81 MG EC tablet, Take 1 tablet (81 mg total) by mouth daily. Swallow whole., Disp: 90 tablet, Rfl: 1 .  atorvastatin (LIPITOR) 80 MG tablet, TAKE 1 TABLET BY MOUTH DAILY AT 6 PM., Disp: 90 tablet, Rfl: 1 .  Blood Glucose Monitoring Suppl (ACCU-CHEK GUIDE ME) w/Device KIT, 1 Act by Does not apply route 3 (three) times daily with meals., Disp: 2 kit, Rfl: 0 .  carvedilol (COREG) 12.5 MG tablet, TAKE 1 TABLET BY MOUTH TWICE A DAY WITH A MEAL, Disp: 180 tablet, Rfl: 3 .  Cholecalciferol (VITAMIN D3) 25 MCG (1000 UT) CAPS, Take by mouth., Disp: , Rfl:  .  Continuous Blood Gluc Receiver  (FREESTYLE LIBRE 14 DAY READER) DEVI, APPLY AS DIRECTED, Disp: 2 each, Rfl: 5 .  Continuous Blood Gluc Sensor (FREESTYLE LIBRE 14 DAY SENSOR) MISC, USE 1 SENSOR EVERY 14 DAYS, THEN REPLACE WITH NEW ONE., Disp: 2 each, Rfl: 5 .  fluticasone (FLONASE) 50 MCG/ACT nasal spray, SPRAY 2 SPRAYS INTO EACH NOSTRIL EVERY DAY, Disp: 48 mL, Rfl: 3 .  gabapentin (NEURONTIN) 300 MG capsule, TAKE 1 CAPSULE BY MOUTH THREE TIMES A DAY, Disp: 270 capsule, Rfl: 2 .  glucose blood (ONETOUCH VERIO) test strip, Use BID to check BS. DX E11.9, Disp: 100 each, Rfl: 12 .  indapamide (LOZOL) 1.25 MG tablet, Take 1.25 mg by mouth daily., Disp: , Rfl:  .  Insulin Pen Needle (PEN NEEDLES) 32G X 4 MM MISC, Use as directed to inject insulin daily. DX: E11.9, Disp: 100 each, Rfl: 3 .  isosorbide mononitrate (IMDUR) 120 MG 24 hr tablet, TAKE 1 TABLET BY MOUTH EVERY DAY, Disp: 90 tablet, Rfl: 3 .  levocetirizine (XYZAL) 5 MG tablet, TAKE 1 TABLET BY MOUTH EVERY DAY IN THE EVENING, Disp: 90 tablet, Rfl: 3 .  metFORMIN (GLUCOPHAGE) 500 MG tablet, TAKE 1 TABLET (500 MG TOTAL) BY MOUTH 2 (TWO) TIMES DAILY WITH A MEAL., Disp: 180 tablet, Rfl: 1 .  NEXLIZET 180-10 MG TABS, TAKE 1 TABLET BY MOUTH DAILY, Disp: 90 tablet, Rfl: 1 .  omega-3 acid ethyl esters (LOVAZA) 1 g capsule, TAKE 2 CAPSULES BY MOUTH 2 TIMES DAILY, Disp: 360 capsule, Rfl: 1 .  pantoprazole (PROTONIX) 40 MG tablet, TAKE 1 TABLET BY MOUTH TWICE A DAY, Disp: 180 tablet, Rfl: 3 .  ranolazine (RANEXA) 500 MG 12 hr tablet, TAKE 1 TABLET BY MOUTH TWICE A DAY, Disp: 180 tablet, Rfl: 1 .  SOLIQUA 100-33 UNT-MCG/ML SOPN, INJECT 60 UNITS INTO THE SKIN DAILY., Disp: 15 mL, Rfl: 1 .  SUPREP BOWEL PREP KIT 17.5-3.13-1.6 GM/177ML SOLN, See admin instructions., Disp: , Rfl:  .  tamsulosin (FLOMAX) 0.4 MG CAPS capsule, Take 0.4 mg by mouth once., Disp: , Rfl:  .  traMADol (ULTRAM) 50 MG tablet, TAKE 1 TABLET BY MOUTH EVERY 8 HOURS AS NEEDED., Disp: 270 tablet, Rfl: 0  EXAM:  VITALS per  patient if applicable:  GENERAL: alert, oriented, appears well and in no acute distress  HEENT: atraumatic, conjunttiva clear, no obvious abnormalities on inspection of external nose and ears  NECK: normal movements of the head and neck  LUNGS: on inspection no signs of respiratory distress, breathing rate appears normal, no obvious gross SOB, gasping or wheezing  CV: no obvious cyanosis  MS: moves all visible extremities without noticeable abnormality  PSYCH/NEURO: pleasant and cooperative, no obvious depression or anxiety, speech and thought processing grossly intact  ASSESSMENT AND PLAN:  Discussed the following   assessment and plan:  COVID-19  -we discussed possible serious and likely etiologies, options for evaluation and workup, limitations of telemedicine visit vs in person visit, treatment, treatment risks and precautions. Pt prefers to treat via telemedicine empirically rather than in person at this moment.  Discussed treatment options, ideal treatment window, potential complications, isolation and precautions for COVID-19.   After a lengthy discussion the patient opted for treatment with molnupiravir due to drug interactions, renal disease, lack of recent labs and does not wish to leave home, desire to do oral over infusion and due to being higher risk for complications of covid or severe disease. Discussed EUA status limited knowledge of risks/interactions/side effects per EUA document vs possible benefits and precautions sharde with patient and provided in patient instructions for wife per his request as he is blind. Had staff contact pharmacy to check his medications for interactions with drug as well and pharmacist at [facility] said ok to take. . Also, advised that patient discuss risks/interactions and use with pharmacist/treatment team as well.  Tessalon Rx sent.  Other symptomatic care measures summarized in patient instructions.  Scheduled follow up with PCP offered: he  opted to follow up as needed.  Advised low threshhold to seek prompt in person care if worsening, new symptoms arise, or if is not improving with treatment. Discussed options for inperson care if PCP office not available. Did let this patient know that I only do telemedicine on Tuesdays and Thursdays for [place]. Advised to schedule follow up visit with PCP or [facility] if any further questions or concerns to avoid delays in care.   I discussed the assessment and treatment plan with the patient. The patient was provided an opportunity to ask questions and all were answered. The patient agreed with the plan and demonstrated an understanding of the instructions.     Hannah R Kim, DO   

## 2021-04-25 NOTE — Telephone Encounter (Signed)
Per Dr Maudie Mercury, I called [facility] and spoke with Gaspar Bidding the pharmacist as Dr Maudie Mercury wanted to know if there were any interactions with the patients current medications and Molnupiravir.  Per Gaspar Bidding, there are no interactions with this medication and the medicines listed in Epic.  Dr Maudie Mercury was advised, I spoke with the pt informed him of this and that Dr Maudie Mercury sent the Rx to [facility].

## 2021-04-25 NOTE — Patient Instructions (Addendum)
HOME CARE TIPS:   -I sent the medication(s) we discussed to your pharmacy: Meds ordered this encounter  Medications   benzonatate (TESSALON) 100 MG capsule    Sig: Take 1 capsule (100 mg total) by mouth 2 (two) times daily as needed for cough.    Dispense:  20 capsule    Refill:  0   molnupiravir EUA 200 mg CAPS    Sig: Take 4 capsules (800 mg total) by mouth 2 (two) times daily for 5 days.    Dispense:  40 capsule    Refill:  0     -I sent in the Covid19 treatment or referral you requested per our discussion. Please see the information provided below and discuss further with the pharmacist/treatment team.   -can use nasal saline a few times per day if you have nasal congestion; sometimes  a short course of Afrin nasal spray for 3 days can help with symptoms as well  -stay hydrated, drink plenty of fluids and eat small healthy meals - avoid dairy  -follow up with your doctor in 2-3 days unless improving and feeling better  -stay home while sick, except to seek medical care. If you have COVID19, ideally it would be best to stay home for a full 10 days since the onset of symptoms PLUS one day of no fever and feeling better. Wear a good mask that fits snugly (such as N95 or KN95) if around others to reduce the risk of transmission.  It was nice to meet you today, and I really hope you are feeling better soon. I help [place] out with telemedicine visits on Tuesdays and Thursdays and am available for visits on those days. If you have any concerns or questions following this visit please schedule a follow up visit with your Primary Care doctor or seek care at a local urgent care clinic to avoid delays in care.    Seek in person care or schedule a follow up video visit promptly if your symptoms worsen, new concerns arise or you are not improving with treatment. Call 911 and/or seek emergency care if your symptoms are severe or life threatening.    Fact Sheet for Patients And  Caregivers Emergency Use Authorization (EUA) Of LAGEVRIO (molnupiravir) capsules For Coronavirus Disease 2019 (COVID-19)  What is the most important information I should know about LAGEVRIO? LAGEVRIO may cause serious side effects, including:  LAGEVRIO may cause harm to your unborn baby. It is not known if LAGEVRIO will harm your baby if you take LAGEVRIO during pregnancy. o LAGEVRIO is not recommended for use in pregnancy. o LAGEVRIO has not been studied in pregnancy. LAGEVRIO was studied in pregnant animals only. When LAGEVRIO was given to pregnant animals, LAGEVRIO caused harm to their unborn babies. o You and your healthcare provider may decide that you should take LAGEVRIO during pregnancy if there are no other COVID-19 treatment options approved or authorized by the FDA that are accessible or clinically appropriate for you. o If you and your healthcare provider decide that you should take LAGEVRIO during pregnancy, you and your healthcare provider should discuss the known and potential benefits and the potential risks of taking LAGEVRIO during pregnancy. For individuals who are able to become pregnant:  You should use a reliable method of birth control (contraception) consistently and correctly during treatment with LAGEVRIO and for 4 days after the last dose of LAGEVRIO. Talk to your healthcare provider about reliable birth control methods.  Before starting treatment with [facility] your healthcare  provider may do a pregnancy test to see if you are pregnant before starting treatment with LAGEVRIO.  Tell your healthcare provider right away if you become pregnant or think you may be pregnant during treatment with LAGEVRIO. Pregnancy Surveillance Program:  There is a pregnancy surveillance program for individuals who take LAGEVRIO during pregnancy. The purpose of this program is to collect information about the health of you and your baby. Talk to your healthcare provider about  how to take part in this program.  If you take LAGEVRIO during pregnancy and you agree to participate in the pregnancy surveillance program and allow your healthcare provider to share your information with [employer], then your healthcare provider will report your use of LAGEVRIO during pregnancy to [employer] by calling [phone] or RelayImage.ca. For individuals who are sexually active with partners who are able to become pregnant:  It is not known if LAGEVRIO can affect sperm. While the risk is regarded as low, animal studies to fully assess the potential for LAGEVRIO to affect the babies of males treated with LAGEVRIO have not been completed. A reliable method of birth control (contraception) should be used consistently and correctly during treatment with LAGEVRIO and for at least 3 months after the last dose. The risk to sperm beyond 3 months is not known. Studies to understand the risk to sperm beyond 3 months are ongoing. Talk to your healthcare provider about reliable birth control methods. Talk to your healthcare provider if you have questions or concerns about how LAGEVRIO may affect sperm. You are being given this fact sheet because your healthcare provider believes it is necessary to provide you with LAGEVRIO for the treatment of adults with mild-to-moderate coronavirus disease 2019 (COVID-19) with positive results of direct SARS-CoV-2 viral testing, and who are at high risk for progression to severe COVID-19 including hospitalization or death, and for whom other COVID-19 treatment options approved or authorized by the FDA are not accessible or clinically appropriate. The U.S. Food and Drug Administration (FDA) has issued an Emergency Use Authorization (EUA) to make LAGEVRIO available during the COVID-19 pandemic (for more details about an EUA please see What is an Emergency Use Authorization? at the end of this document). LAGEVRIO is  not an FDA-approved medicine in the [place]. Read this Fact Sheet for information about LAGEVRIO. Talk to your healthcare provider about your options if you have any questions. It is your choice to take LAGEVRIO.  What is COVID-19? COVID-19 is caused by a virus called a coronavirus. You can get COVID-19 through close contact with another person who has the virus. COVID-19 illnesses have ranged from very mild-to-severe, including illness resulting in death. While information so far suggests that most COVID-19 illness is mild, serious illness can happen and may cause some of your other medical conditions to become worse. Older people and people of all ages with severe, long lasting (chronic) medical conditions like heart disease, lung disease and diabetes, for example seem to be at higher risk of being hospitalized for COVID-19.  What is LAGEVRIO? LAGEVRIO is an investigational medicine used to treat mild-to-moderate COVID-19 in adults:  with positive results of direct SARS-CoV-2 viral testing, and  who are at high risk for progression to severe COVID-19 including hospitalization or death, and for whom other COVID-19 treatment options approved or authorized by the FDA are not accessible or clinically appropriate. The FDA has authorized the emergency use of LAGEVRIO for the treatment of mild-tomoderate COVID-19 in adults  under an EUA. For more information on EUA, see the What is an Emergency Use Authorization (EUA)? section at the end of this Fact Sheet. LAGEVRIO is not authorized:  for use in people less than 55 years of age.  for prevention of COVID-19.  for people needing hospitalization for COVID-19.  for use for longer than 5 consecutive days.  What should I tell my healthcare provider before I take LAGEVRIO? Tell your healthcare provider if you:  Have any allergies  Are breastfeeding or plan to breastfeed  Have any serious illnesses  Are taking any medicines  (prescription, over-the-counter, vitamins, or herbal products).  How do I take LAGEVRIO?  Take LAGEVRIO exactly as your healthcare provider tells you to take it.  Take 4 capsules of LAGEVRIO every 12 hours (for example, at 8 am and at 8 pm)  Take LAGEVRIO for 5 days. It is important that you complete the full 5 days of treatment with LAGEVRIO. Do not stop taking LAGEVRIO before you complete the full 5 days of treatment, even if you feel better.  Take LAGEVRIO with or without food.  You should stay in isolation for as long as your healthcare provider tells you to. Talk to your healthcare provider if you are not sure about how to properly isolate while you have COVID-19.  Swallow LAGEVRIO capsules whole. Do not open, break, or crush the capsules. If you cannot swallow capsules whole, tell your healthcare provider.  What to do if you miss a dose: o If it has been less than 10 hours since the missed dose, take it as soon as you remember o If it has been more than 10 hours since the missed dose, skip the missed dose and take your dose at the next scheduled time.  Do not double the dose of LAGEVRIO to make up for a missed dose.  What are the important possible side effects of LAGEVRIO?  See, What is the most important information I should know about LAGEVRIO?  Allergic Reactions. Allergic reactions can happen in people taking LAGEVRIO, even after only 1 dose. Stop taking LAGEVRIO and call your healthcare provider right away if you get any of the following symptoms of an allergic reaction: o hives o rapid heartbeat o trouble swallowing or breathing o swelling of the mouth, lips, or face o throat tightness o hoarseness o skin rash The most common side effects of LAGEVRIO are:  diarrhea  nausea  dizziness These are not all the possible side effects of LAGEVRIO. Not many people have taken LAGEVRIO. Serious and unexpected side effects may happen. This medicine is still  being studied, so it is possible that all of the risks are not known at this time.  What other treatment choices are there?  Veklury (remdesivir) is FDA-approved as an intravenous (IV) infusion for the treatment of mildto-moderate COVID-19 in certain adults and children. Talk with your doctor to see if Elias Else is appropriate for you. Like LAGEVRIO, FDA may also allow for the emergency use of other medicines to treat people with COVID-19. Go to http://www.austin-beck.biz/ for more information. It is your choice to be treated or not to be treated with LAGEVRIO. Should you decide not to take it, it will not change your standard medical care.  What if I am breastfeeding? Breastfeeding is not recommended during treatment with LAGEVRIO and for 4 days after the last dose of LAGEVRIO. If you are breastfeeding or plan to breastfeed, talk to your healthcare provider about your options and specific situation  before taking LAGEVRIO.  How do I report side effects with LAGEVRIO? Contact your healthcare provider if you have any side effects that bother you or do not go away. Report side effects to FDA MedWatch at MacRetreat.be or call 1-800-FDA-1088 [phone]).  How should I store LAGEVRIO?  Store LAGEVRIO capsules at room temperature between 7F to 7F (20C to 25C).  Keep LAGEVRIO and all medicines out of the reach of children and pets. How can I learn more about COVID-19?  Ask your healthcare provider.  Visit IndexCrawler.co.za  Contact your local or state public health department.  Call [employer] at [phone] (toll free in the [place]  Visit www.molnupiravir.com  What Is an Emergency Use Authorization (EUA)? The [place] FDA has made LAGEVRIO available under an emergency access mechanism called an Emergency Use Authorization (EUA) The EUA is supported by  a Insurance account manager Health and Human Service (HHS) declaration that circumstances exist to justify emergency use of drugs and biological products during the COVID-19 pandemic. LAGEVRIO for the treatment of mild-to-moderate COVID-19 in adults with positive results of direct SARS-CoV-2 viral testing, who are at high risk for progression to severe COVID-19, including hospitalization or death, and for whom alternative COVID-19 treatment options approved or authorized by FDA are not accessible or clinically appropriate, has not undergone the same type of review as an FDA-approved product. In issuing an EUA under the COVID-19 public health emergency, the FDA has determined, among other things, that based on the total amount of scientific evidence available including data from adequate and well-controlled clinical trials, if available, it is reasonable to believe that the product may be effective for diagnosing, treating, or preventing COVID-19, or a serious or life-threatening disease or condition caused by COVID-19; that the known and potential benefits of the product, when used to diagnose, treat, or prevent such disease or condition, outweigh the known and potential risks of such product; and that there are no adequate, approved, and available alternatives.  All of these criteria must be met to allow for the product to be used in the treatment of patients during the COVID-19 pandemic. The EUA for LAGEVRIO is in effect for the duration of the COVID-19 declaration justifying emergency use of LAGEVRIO, unless terminated or revoked (after which LAGEVRIO may no longer be used under the EUA). For patent information: LeaseGuru.tn Copyright  2021-2022 [employer] [place], [place] and its affiliates. All rights reserved. [id number] Revised: March 2022

## 2021-05-04 ENCOUNTER — Encounter: Payer: Self-pay | Admitting: Internal Medicine

## 2021-05-07 ENCOUNTER — Telehealth: Payer: Self-pay | Admitting: Pharmacist

## 2021-05-08 NOTE — Progress Notes (Signed)
Chronic Care Management Pharmacy Assistant   Name: [name]  MRN: [id number] DOB: 1951-09-27  [name] is an 70 y.o. year old male who presents for his initial CCM visit with the clinical pharmacist.  Reason for Encounter: Initial Visit with Clinical Pharmacist    Recent office visits:  None ID  Recent consult visits:  None ID  Hospital visits:  None in previous 6 months  Medications: Outpatient Encounter Medications as of 05/07/2021  Medication Sig  . empagliflozin (JARDIANCE) 25 MG TABS tablet Take 1 tablet (25 mg total) by mouth daily.  Marland Kitchen aspirin 81 MG EC tablet Take 1 tablet (81 mg total) by mouth daily. Swallow whole.  Marland Kitchen atorvastatin (LIPITOR) 80 MG tablet TAKE 1 TABLET BY MOUTH DAILY AT 6 PM.  . benzonatate (TESSALON) 100 MG capsule Take 1 capsule (100 mg total) by mouth 2 (two) times daily as needed for cough.  . Blood Glucose Monitoring Suppl (ACCU-CHEK GUIDE ME) w/Device KIT 1 Act by Does not apply route 3 (three) times daily with meals.  . carvedilol (COREG) 12.5 MG tablet TAKE 1 TABLET BY MOUTH TWICE A DAY WITH A MEAL  . Cholecalciferol (VITAMIN D3) 25 MCG (1000 UT) CAPS Take by mouth.  . Continuous Blood Gluc Receiver (FREESTYLE LIBRE 14 DAY READER) DEVI APPLY AS DIRECTED  . Continuous Blood Gluc Sensor (FREESTYLE LIBRE 14 DAY SENSOR) MISC USE 1 SENSOR EVERY 14 DAYS, THEN REPLACE WITH NEW ONE.  . fluticasone (FLONASE) 50 MCG/ACT nasal spray SPRAY 2 SPRAYS INTO EACH NOSTRIL EVERY DAY  . gabapentin (NEURONTIN) 300 MG capsule TAKE 1 CAPSULE BY MOUTH THREE TIMES A DAY  . glucose blood (ONETOUCH VERIO) test strip Use BID to check BS. DX E11.9  . indapamide (LOZOL) 1.25 MG tablet Take 1.25 mg by mouth daily.  . Insulin Pen Needle (PEN NEEDLES) 32G X 4 MM MISC Use as directed to inject insulin daily. DX: E11.9  . isosorbide mononitrate (IMDUR) 120 MG 24 hr tablet TAKE 1 TABLET BY MOUTH EVERY DAY  . levocetirizine (XYZAL) 5 MG tablet TAKE 1 TABLET BY MOUTH EVERY  DAY IN THE EVENING  . metFORMIN (GLUCOPHAGE) 500 MG tablet TAKE 1 TABLET (500 MG TOTAL) BY MOUTH 2 (TWO) TIMES DAILY WITH A MEAL.  Marland Kitchen NEXLIZET 180-10 MG TABS TAKE 1 TABLET BY MOUTH DAILY  . omega-3 acid ethyl esters (LOVAZA) 1 g capsule TAKE 2 CAPSULES BY MOUTH 2 TIMES DAILY  . pantoprazole (PROTONIX) 40 MG tablet TAKE 1 TABLET BY MOUTH TWICE A DAY  . ranolazine (RANEXA) 500 MG 12 hr tablet TAKE 1 TABLET BY MOUTH TWICE A DAY  . SOLIQUA 100-33 UNT-MCG/ML SOPN INJECT 60 UNITS INTO THE SKIN DAILY.  Marland Kitchen SUPREP BOWEL PREP KIT 17.5-3.13-1.6 GM/177ML SOLN See admin instructions.  . tamsulosin (FLOMAX) 0.4 MG CAPS capsule Take 0.4 mg by mouth once.  . traMADol (ULTRAM) 50 MG tablet TAKE 1 TABLET BY MOUTH EVERY 8 HOURS AS NEEDED.   No facility-administered encounter medications on file as of 05/07/2021.   Have you seen any other providers since your last visit?  Patient states he has not seen any other provider since [name]  Any changes in your medications or health?  Patient states that he has not had any changes in health or medications  Any side effects from any medications?  Patient states that he has not have any side effects from medications  Do you have an symptoms or problems not managed by your medications?  Patient states that he does not have any symptoms or problems not managed by medications  Any concerns about your health right now?  Patient states that he believes he needs to see a kidney specialist  Has your provider asked that you check blood pressure, blood sugar, or follow special diet at home? Patient states that he does check blood pressure occasionally, and blood sugar. He states that he has had reading above 250 and as low as 40. Wife has had to call EMS.  Do you get any type of exercise on a regular basis?  Patient states that he can't exercise because of a bad back surgery he had, states he is pretty much in a wheel chair  Can you think of a goal you would like to  reach for your health?  Patient states that his goal is to maintain his blood sugars and to make sure that he is taking all of his medications without missing doses. Patient is legally blind and sometimes when taking meds they fall on the floor and he runs out.  Do you have any problems getting your medications? Patient states that they are having transportation issues right now and that the pharmacy delivers to the at a cost but won't deliver insulin. Patient wants to discuss changing pharmacies  Is there anything that you would like to discuss during the appointment?  Patient and wife would like to discuss patient getting some help from a personal home aide to come in a few hours a week, even if insurance does not pay for it, they are willing to pay. Also discuss changing pharmacies and getting a kidney specialist.  Please bring medications and supplements to appointment  Star Rating Drugs: Atorvastatin 04/23/21 90 d  [place] Pharmacist Assistant [phone]  Time spent:60

## 2021-05-09 ENCOUNTER — Other Ambulatory Visit: Payer: Self-pay

## 2021-05-09 ENCOUNTER — Ambulatory Visit (INDEPENDENT_AMBULATORY_CARE_PROVIDER_SITE_OTHER): Payer: PPO | Admitting: Pharmacist

## 2021-05-09 DIAGNOSIS — I5032 Chronic diastolic (congestive) heart failure: Secondary | ICD-10-CM

## 2021-05-09 DIAGNOSIS — N400 Enlarged prostate without lower urinary tract symptoms: Secondary | ICD-10-CM | POA: Diagnosis not present

## 2021-05-09 DIAGNOSIS — E785 Hyperlipidemia, unspecified: Secondary | ICD-10-CM

## 2021-05-09 DIAGNOSIS — E118 Type 2 diabetes mellitus with unspecified complications: Secondary | ICD-10-CM | POA: Diagnosis not present

## 2021-05-09 DIAGNOSIS — N1831 Chronic kidney disease, stage 3a: Secondary | ICD-10-CM | POA: Diagnosis not present

## 2021-05-09 DIAGNOSIS — I10 Essential (primary) hypertension: Secondary | ICD-10-CM | POA: Diagnosis not present

## 2021-05-09 DIAGNOSIS — I2510 Atherosclerotic heart disease of native coronary artery without angina pectoris: Secondary | ICD-10-CM | POA: Diagnosis not present

## 2021-05-09 NOTE — Patient Instructions (Addendum)
Visit Information  Phone number for Pharmacist: [phone]  Thank you for meeting with me to discuss your medications! I look forward to working with you to achieve your health care goals. Below is a summary of what we talked about during the visit:  Goals Addressed            This Visit's Progress   . Manage My Diet       Timeframe:  Long-Range Goal Priority:  High Start Date:    05/09/21                         Expected End Date:       08/20/21                 Follow Up Date 05/15/21    - ask for help if I have trouble affording healthy foods - choose foods low in fat and sugar - choose foods that are low in sodium (salt) - keep healthy snacks on hand - watch for swelling in feet, ankles and legs every day    Why is this important?    A healthy diet is important for mental and physical health.   Healthy food helps repair damaged body tissue and maintains strong bones and muscles.   No single food is just right so eating a variety of proteins, fruits, vegetables and grains is best.   You may need to change what you eat or drink to manage kidney disease.   A dietitian is the best person to guide you.     Notes:     Marland Kitchen Manage My Medicine       Timeframe:  Long-Range Goal Priority:  Medium Start Date:    05/09/21                         Expected End Date:    08/20/21                   Follow Up Date 05/15/21   - call for medicine refill 2 or 3 days before it runs out - call if I am sick and can't take my medicine - keep a list of all the medicines I take; vitamins and herbals too -Consider [facility] for medication synchronization, packaging and delivery     Why is this important?   . These steps will help you keep on track with your medicines.   Notes:        [name] was given information about Chronic Care Management services today including:  1. CCM service includes personalized support from designated clinical staff supervised by his physician,  including individualized plan of care and coordination with other care providers 2. 24/7 contact phone numbers for assistance for urgent and routine care needs. 3. Standard insurance, coinsurance, copays and deductibles apply for chronic care management only during months in which we provide at least 20 minutes of these services. Most insurances cover these services at 100%, however patients may be responsible for any copay, coinsurance and/or deductible if applicable. This service may help you avoid the need for more expensive face-to-face services. 4. Only one practitioner may furnish and bill the service in a calendar month. 5. The patient may stop CCM services at any time (effective at the end of the month) by phone call to the office staff.  Patient agreed to services and verbal consent obtained.   Patient verbalizes understanding of  instructions provided today and agrees to view in [place].  Telephone follow up appointment with pharmacy team member scheduled for: 1 week  Charlene Brooke, PharmD, BCACP, CPP Clinical Pharmacist [facility] [phone]   Chronic Kidney Disease, Adult Chronic kidney disease is when lasting damage happens to the kidneys slowly over a long time. The kidneys help to:  Make pee (urine).  Make hormones.  Keep the right amount of fluids and chemicals in the body. Most often, this disease does not go away. You must take steps to help keep the kidney damage from getting worse. If steps are not taken, the kidneys might stop working forever. What are the causes?  Diabetes.  High blood pressure.  Diseases that affect the heart and blood vessels.  Other kidney diseases.  Diseases of the body's disease-fighting system.  A problem with the flow of pee.  Infections of the organs that make pee, store it, and take it out of the body.  Swelling or irritation of your blood vessels. What increases the risk?  Getting  older.  Having someone in your family who has kidney disease or kidney failure.  Having a disease caused by genes.  Taking medicines often that harm the kidneys.  Being near or having contact with harmful substances.  Being very overweight.  Using tobacco now or in the past. What are the signs or symptoms?  Feeling very tired.  Having a swollen face, legs, ankles, or feet.  Feeling like you may vomit or vomiting.  Not feeling hungry.  Being confused or not able to focus.  Twitches and cramps in the leg muscles or other muscles.  Dry, itchy skin.  A taste of metal in your mouth.  Making less pee, or making more pee.  Shortness of breath.  Trouble sleeping. You may also become anemic or get weak bones. Anemic means there is not enough red blood cells or hemoglobin in your blood. You may get symptoms slowly. You may not notice them until the kidney damage gets very bad. How is this treated? Often, there is no cure for this disease. Treatment can help with symptoms and help keep the disease from getting worse. You may need to:  Avoid alcohol.  Avoid foods that are high in salt, potassium, phosphorous, and protein.  Take medicines for symptoms and to help control other conditions.  Have dialysis. This treatment gets harmful waste out of your body.  Treat other problems that cause your kidney disease or make it worse. Follow these instructions at home: Medicines  Take over-the-counter and prescription medicines only as told by your doctor.  Do not take any new medicines, vitamins, or supplements unless your doctor says it is okay. Lifestyle  Do not smoke or use any products that contain nicotine or tobacco. If you need help quitting, ask your doctor.  If you drink alcohol: ? Limit how much you use to:  0-1 drink a day for women who are not pregnant.  0-2 drinks a day for men. ? Know how much alcohol is in your drink. In the [place] one drink equals one 12 oz  bottle of beer (355 mL), one 5 oz glass of wine (148 mL), or one 1 oz glass of hard liquor (44 mL).  Stay at a healthy weight. If you need help losing weight, ask your doctor.   General instructions  Follow instructions from your doctor about what you cannot eat or drink.  Track your blood pressure at home. Tell  your doctor about any changes.  If you have diabetes, track your blood sugar.  Exercise at least 30 minutes a day, 5 days a week.  Keep your shots (vaccinations) up to date.  Keep all follow-up visits.   Where to find more information  American Association of Kidney Patients: BombTimer.gl  National Kidney Foundation: www.kidney.[place]: https://mathis.com/  Life Options: www.lifeoptions.org  Kidney School: www.kidneyschool.org Contact a doctor if:  Your symptoms get worse.  You get new symptoms. Get help right away if:  You get symptoms of end-stage kidney disease. These include: ? Headaches. ? Losing feeling in your hands or feet. ? Easy bruising. ? Having hiccups often. ? Chest pain. ? Shortness of breath. ? Lack of menstrual periods, in women.  You have a fever.  You make less pee than normal.  You have pain or you bleed when you pee or poop. These symptoms may be an emergency. Get help right away. Call your local emergency services (911 in the [place]  Do not wait to see if the symptoms will go away.  Do not drive yourself to the hospital. Summary  Chronic kidney disease is when lasting damage happens to the kidneys slowly over a long time.  Causes of this disease include diabetes and high blood pressure.  Often, there is no cure for this disease. Treatment can help symptoms and help keep the disease from getting worse.  Treatment may involve lifestyle changes, medicines, and dialysis. This information is not intended to replace advice given to you by your health care provider. Make sure you discuss any questions you have with  your health care provider. Document Revised: 03/14/2020 Document Reviewed: 03/14/2020 Elsevier Patient Education  [place].

## 2021-05-09 NOTE — Progress Notes (Signed)
Chronic Care Management Pharmacy Note  05/09/2021 Name:  [name] MRN:  [id number] DOB:  16-Jul-1951  Subjective: [name] is an 70 y.o. year old male who is a primary patient of [name], MD.  The CCM team was consulted for assistance with disease management and care coordination needs.    Engaged with patient by telephone for initial visit in response to provider referral for pharmacy case management and/or care coordination services.   Consent to Services:  The patient was given the following information about Chronic Care Management services today, agreed to services, and gave verbal consent: 1. CCM service includes personalized support from designated clinical staff supervised by the primary care provider, including individualized plan of care and coordination with other care providers 2. 24/7 contact phone numbers for assistance for urgent and routine care needs. 3. Service will only be billed when office clinical staff spend 20 minutes or more in a month to coordinate care. 4. Only one practitioner may furnish and bill the service in a calendar month. 5.The patient may stop CCM services at any time (effective at the end of the month) by phone call to the office staff. 6. The patient will be responsible for cost sharing (co-pay) of up to 20% of the service fee (after annual deductible is met). Patient agreed to services and consent obtained.  Patient Care Team: [name], MD as PCP - General Marlou Porch Thana Farr, MD as PCP - Cardiology (Cardiology) Star Age, MD as Attending Physician (Neurology) Gardiner Barefoot, DPM as Consulting Physician (Podiatry) Charlton Haws, [facility] as Pharmacist (Pharmacist)   Patient lives at home with his wife. He is legally blind. His wife just started a new job working from home, starts at [employer]  each day. They are interested in getting an in-home aid a few hours a day to help him with ADLs, even if they have to pay for it.  Recent  office visits: 11/08/20 Dr [name] OV: chronic f/u; BG above 200 at home. Advised to gradually increase Soliqua until BG < 150. GFR < 60.  Recent consult visits: 04/25/21 Dr Maudie Mercury ([facility]): covid-19. Rx'd molnupiravir (Lagevrio) and benzonatate  02/18/21 Dr Elisha Ponder (podiatry): onychomycosis  02/04/21 Dr Jeffie Pollock (urology): f/u BPH.  12/03/20 NP Serv[facility] (cardiology): f/u CAD. D/C indapamide d/t patient not taking?  Hospital visits: None in previous 6 months  Objective:  Lab Results  Component Value Date   CREATININE 1.35 11/08/2020   BUN 22 11/08/2020   GFR 52.93 (L) 11/08/2020   GFRNONAA >60 07/09/2017   GFRAA >60 07/09/2017   NA 137 11/08/2020   K 4.2 11/08/2020   CALCIUM 8.9 11/08/2020   CO2 30 11/08/2020   GLUCOSE 153 (H) 11/08/2020    Lab Results  Component Value Date/Time   HGBA1C 8.0 (A) 11/08/2020 01:35 PM   HGBA1C 8.0 (H) 05/08/2020 01:52 PM   HGBA1C 6.4 (A) 02/08/2020 04:23 PM   HGBA1C 6.6 (H) 08/03/2019 02:13 PM   GFR 52.93 (L) 11/08/2020 02:13 PM   GFR 54.19 (L) 05/31/2020 11:44 AM   MICROALBUR 0.5 02/08/2020 04:45 PM   MICROALBUR 4.2 (H) 02/24/2019 03:56 PM    Last diabetic Eye exam:  Lab Results  Component Value Date/Time   HMDIABEYEEXA Retinopathy (A) 07/03/2020 12:00 AM    Last diabetic Foot exam:  Lab Results  Component Value Date/Time   HMDIABFOOTEX normal 04/04/2013 12:00 AM     Lab Results  Component Value Date   CHOL 93 05/08/2020  HDL 22.10 (L) 05/08/2020   LDLCALC 40 05/08/2020   LDLDIRECT 82.7 04/04/2013   TRIG 158.0 (H) 05/08/2020   CHOLHDL 4 05/08/2020    Hepatic Function Latest Ref Rng & Units 02/24/2019 11/24/2017 12/09/2016  Total Protein 6.0 - 8.3 g/dL 7.4 7.0 7.3  Albumin 3.5 - 5.2 g/dL 4.2 4.1 4.1  AST 0 - 37 U/L _0 ALT 0 - 53 U/L _1 Alk Phosphatase 39 - 117 U/L 53 53 60  Total Bilirubin 0.2 - 1.2 mg/dL 0.6 0.8 0.6  Bilirubin, Direct 0.0 - 0.3 mg/dL - - -    Lab Results  Component Value Date/Time    TSH 1.15 02/24/2019 03:56 PM   TSH 1.73 11/24/2017 11:16 AM    CBC Latest Ref Rng & Units 11/08/2020 05/08/2020 02/08/2020  WBC 4.0 - 10.5 K/uL 5.0 6.4 4.5  Hemoglobin 13.0 - 17.0 g/dL 12.5(L) 10.5(L) 11.7(L)  Hematocrit 39.0 - 52.0 % 39.1 33.2(L) 36.6(L)  Platelets 150.0 - 400.0 K/uL 244.0 224.0 281    No results found for: VD25OH  Clinical ASCVD: Yes  The ASCVD Risk score Mikey Bussing DC Jr., et al., 2013) failed to calculate for the following reasons:   The valid total cholesterol range is 130 to 320 mg/dL    Depression screen [facility] 2/9 03/04/2021 11/08/2020 07/13/2019  Decreased Interest 0 0 1  Down, Depressed, Hopeless 0 0 1  PHQ - 2 Score 0 0 2  Altered sleeping - - -  Tired, decreased energy - - -  Change in appetite - - -  Feeling bad or failure about yourself  - - -  Trouble concentrating - - -  Moving slowly or fidgety/restless - - -  Suicidal thoughts - - -  PHQ-9 Score - - -  Difficult doing work/chores - - -  Some recent data might be hidden      Social History   Tobacco Use  Smoking Status Never Smoker  Smokeless Tobacco Never Used   BP Readings from Last 3 Encounters:  01/29/21 (!) 143/62  11/08/20 136/68  05/31/20 140/60   Pulse Readings from Last 3 Encounters:  01/29/21 70  11/08/20 61  05/31/20 76   Wt Readings from Last 3 Encounters:  01/29/21 221 lb (100.2 kg)  01/15/21 221 lb (100.2 kg)  12/03/20 221 lb (100.2 kg)   BMI Readings from Last 3 Encounters:  01/29/21 31.71 kg/m  01/15/21 30.82 kg/m  12/03/20 31.71 kg/m    Assessment/Interventions: Review of patient past medical history, allergies, medications, health status, including review of consultants reports, laboratory and other test data, was performed as part of comprehensive evaluation and provision of chronic care management services.   SDOH:  (Social Determinants of Health) assessments and interventions performed: Yes  SDOH Screenings   Alcohol Screen: Low Risk   . Last Alcohol  Screening Score (AUDIT): 0  Depression (PHQ2-9): Low Risk   . PHQ-2 Score: 0  Financial Resource Strain: Low Risk   . Difficulty of Paying Living Expenses: Not hard at all  Food Insecurity: No Food Insecurity  . Worried About Charity fundraiser in the Last Year: Never true  . Ran Out of Food in the Last Year: Never true  Housing: Low Risk   . Last Housing Risk Score: 0  Physical Activity: Inactive  . Days of Exercise per Week: 0 days  . Minutes of Exercise per Session: 0 min  Social Connections: Socially Integrated  . Frequency of Communication with Friends  and Family: More than three times a week  . Frequency of Social Gatherings with Friends and Family: Once a week  . Attends Religious Services: 1 to 4 times per year  . Active Member of Clubs or Organizations: No  . Attends Archivist Meetings: 1 to 4 times per year  . Marital Status: Married  Stress: No Stress Concern Present  . Feeling of Stress : Not at all  Tobacco Use: Low Risk   . Smoking Tobacco Use: Never Smoker  . Smokeless Tobacco Use: Never Used  Transportation Needs: Unmet Transportation Needs  . Lack of Transportation (Medical): Yes  . Lack of Transportation (Non-Medical): Yes    CCM Care Plan  Allergies  Allergen Reactions  . Furosemide     pancreatitis  . Olmesartan Other (See Comments)    hyperkalemia  . Amlodipine Swelling    LE EDEMA  . Diamox [Acetazolamide] Itching    hives  . Lisinopril Rash    Medications Reviewed Today    Reviewed by Sheral Flow, LPN (Licensed Practical Nurse) on 03/04/21 at 30  Med List Status: <None>  Medication Order Taking? Sig Documenting Provider Last Dose Status Informant  aspirin 81 MG EC tablet [id number] No Take 1 tablet (81 mg total) by mouth daily. Swallow whole. Milus Banister, MD Unknown Active   atorvastatin (LIPITOR) 80 MG tablet [id number] No TAKE 1 TABLET (80 MG TOTAL) BY MOUTH DAILY AT 6 PM. [name], MD 01/28/2021 Active   Blood  Glucose Monitoring Suppl (ACCU-CHEK GUIDE ME) w/Device KIT [id number] No 1 Act by Does not apply route 3 (three) times daily with meals. [name], MD 01/28/2021 Active   carvedilol (COREG) 12.5 MG tablet [id number] No TAKE 1 TABLET BY MOUTH TWICE A DAY WITH A MEAL Burtis Junes, NP 01/28/2021 Active   Cholecalciferol (VITAMIN D3) 25 MCG (1000 UT) CAPS [id number] No Take by mouth. [provider] 01/28/2021 Active            Med Note Domenic Polite Dec 03, 2020  8:29 AM)    Continuous Blood Gluc Receiver (FREESTYLE LIBRE 14 DAY READER) [place] [id number] No APPLY AS DIRECTED [name], MD 01/28/2021 Active   Continuous Blood Gluc Sensor (FREESTYLE LIBRE 14 DAY SENSOR) MISC [id number]  USE 1 SENSOR EVERY 14 DAYS, THEN REPLACE WITH NEW ONE. [name], MD  Active   fluticasone [facility]) 50 MCG/ACT nasal spray [id number] No SPRAY 2 SPRAYS INTO EACH NOSTRIL EVERY DAY [name], MD 01/28/2021 Active   gabapentin (NEURONTIN) 300 MG capsule [id number] No TAKE 1 CAPSULE BY MOUTH THREE TIMES A DAY Jessy Oto, MD 01/28/2021 Active   glucose blood (ONETOUCH VERIO) test strip [id number] No Use BID to check BS. DX E11.9 [name], MD 01/28/2021 Active Self  indapamide (LOZOL) 1.25 MG tablet [id number] No Take 1.25 mg by mouth daily. [provider] 01/28/2021 Active   Insulin Pen Needle (PEN NEEDLES) 32G X 4 MM MISC [id number] No Use as directed to inject insulin daily. DX: E11.9 [name], MD 01/28/2021 Active   isosorbide mononitrate (IMDUR) 120 MG 24 hr tablet [id number] No TAKE 1 TABLET BY MOUTH EVERY DAY Jerline Pain, MD 01/28/2021 Active   levocetirizine (XYZAL) 5 MG tablet [id number] No TAKE 1 TABLET BY MOUTH EVERY DAY IN THE Georgette Dover, MD 01/28/2021 Active   metFORMIN (GLUCOPHAGE) 500 MG tablet [id number] No TAKE 1 TABLET (500 MG TOTAL) BY MOUTH  2 (TWO) TIMES DAILY WITH A MEAL. [name], MD 01/28/2021 Active   NEXLIZET 180-10 MG TABS [id number] No TAKE 1  TABLET BY MOUTH DAILY [name], MD 01/28/2021 Active   omega-3 acid ethyl esters (LOVAZA) 1 g capsule [id number] No TAKE 2 CAPSULES BY MOUTH 2 TIMES DAILY [name], MD 01/28/2021 Active   pantoprazole (PROTONIX) 40 MG tablet [id number] No TAKE 1 TABLET BY MOUTH TWICE A DAY Jerline Pain, MD 01/28/2021 Active   ranolazine (RANEXA) 500 MG 12 hr tablet [id number] No TAKE 1 TABLET BY MOUTH TWICE A Frances Nickels, MD 01/28/2021 Active   SOLIQUA 100-33 UNT-MCG/ML SOPN [id number]  INJECT 60 UNITS INTO THE SKIN DAILY. [name], MD  Active   SUPREP BOWEL PREP KIT 17.5-3.13-1.6 GM/177ML Bailey Mech [id number]  See admin instructions. [provider]  Active   tamsulosin (FLOMAX) 0.4 MG CAPS capsule [id number] No Take 0.4 mg by mouth once. [provider] 01/28/2021 Active Self  traMADol (ULTRAM) 50 MG tablet [id number]  TAKE 1 TABLET BY MOUTH EVERY 8 HOURS AS NEEDED. [name], MD  Active           Patient Active Problem List   Diagnosis Date Noted  . Age-related cognitive decline 03/07/2021  . Chronic renal disease, stage 3, moderately decreased glomerular filtration rate (GFR) between 30-59 mL/min/1.73 square meter ([place]) 08/07/2019  . Chronic bacterial conjunctivitis of right eye 05/26/2019  . Primary open angle glaucoma (POAG) of both eyes, severe stage 03/17/2019  . Polyp of colon 02/24/2019  . Anemia, chronic disease 07/22/2017  . Seasonal allergic rhinitis due to pollen 07/22/2017  . Chronic diastolic heart failure ([place]) 11/18/2016  . Type 2 diabetes mellitus with complication ([place])   . Spinal stenosis, lumbar region, with neurogenic claudication 10/26/2015    Class: Chronic  . Neurogenic claudication due to lumbar spinal stenosis 10/26/2015  . Sleep apnea 10/24/2015  . Lumbar radiculopathy 03/21/2015  . Diabetic neuropathy ([place]) 02/13/2014  . Postlaminectomy syndrome, lumbar region 02/13/2014  . Claudication in peripheral vascular disease ([place]) 12/28/2013  .  POAG (primary open-angle glaucoma) 12/19/2013  . GERD (gastroesophageal reflux disease) 08/25/2013  . Pure hyperglyceridemia 08/25/2013  . Advanced stage glaucoma 01/05/2013  . HTN (hypertension) 01/05/2013  . Diabetic macular edema of right eye ([place]) 11/25/2012  . Neurogenic bladder 10/01/2012  . Status post corneal transplant 07/15/2012  . Dyslipidemia, goal LDL below 70 04/02/2012  . Routine general medical examination at a health care facility 04/02/2012  . B12 deficiency anemia 03/31/2012  . BPH (benign prostatic hyperplasia) 03/31/2012  . Obesity 05/26/2011  . ED (erectile dysfunction) 05/26/2011  . Coronary atherosclerosis 08/28/2010  . Congestive heart failure ([place]) 08/28/2010    Immunization History  Administered Date(s) Administered  . Fluad Quad(high Dose 65+) 02/08/2020, 11/08/2020  . Influenza Split 09/12/2012  . Influenza Whole 10/28/2010, 10/28/2011  . Influenza, High Dose Seasonal PF 08/25/2013, 08/21/2015, 12/09/2016, 02/24/2019  . Influenza, Quadrivalent, Recombinant, Inj, Pf 11/04/2017  . Influenza,inj,Quad PF,6+ Mos 10/25/2014  . PFIZER(Purple Top)SARS-COV-2 Vaccination 04/06/2020, 04/27/2020, 01/14/2021  . Pneumococcal Conjugate-13 10/25/2014  . Pneumococcal Polysaccharide-23 10/28/2010, 09/13/2012, 02/08/2020  . Pneumococcal-Unspecified 07/22/2014  . Td 10/22/2010  . Tdap 05/08/2020    Conditions to be addressed/monitored:  Hypertension, Hyperlipidemia, Diabetes, Heart Failure, Coronary Artery Disease, GERD, Chronic Kidney Disease, BPH and Allergic Rhinitis  Care Plan : CCM Phamacy Care Plan  Updates made by Charlton Haws, [place] since 05/09/2021 12:00 AM    Problem: Hypertension, Hyperlipidemia,  Diabetes, Heart Failure, Coronary Artery Disease, GERD, Chronic Kidney Disease, BPH and Allergic Rhinitis   Priority: High    Long-Range Goal: Disease management   Start Date: 05/09/2021  Expected End Date: 08/09/2021  This Visit's Progress: On track   Priority: High  Note:   Current Barriers:  . Unable to independently monitor therapeutic efficacy . Unable to achieve control of diabetes  . Unable to self administer medications as prescribed  Pharmacist Clinical Goal(s):  Marland Kitchen Patient will achieve adherence to monitoring guidelines and medication adherence to achieve therapeutic efficacy through collaboration with PharmD and provider.   Interventions: . 1:1 collaboration with [name], MD regarding development and update of comprehensive plan of care as evidenced by provider attestation and co-signature . Inter-disciplinary care team collaboration (see longitudinal plan of care) . Comprehensive medication review performed; medication list updated in electronic medical record  Hyperlipidemia / CAD (LDL goal < 70) -Controlled - LDL is at goal; pt endorses compliance with medications as prescribed; however, Nexlizet is not on the pharmacy dispense report and has not been prescribed since 07/2020 x 6 month supply, so it is likely that he is not in fact taking Nexlizet currently -hx CAD: BMS 2008 -Current treatment: . Atorvastatin 80 mg daily PM . Nexlizet 180-10 mg daily *likely not taking* . Ranolazine 500 mg BID . Omega-3 (Lovaza) BID . Aspirin 81 mg daily -Educated on Cholesterol goals;  Benefits of statin for ASCVD risk reduction; -Recommended to continue current medication  Diabetes (A1c goal <8%) -Not ideally controlled - A1c is close to goal but from 6 months ago; he is wearing Freestyle Libre 2 as of 2 weeks ago so now is getting alarms for low and high sugars;  -he reports 2-3 episodes of hypoglycemia < 55 in the past 6 months - 1 episode required call to CMS and glucagon injection given; he thinks it is likely he ate less/skipped meal prior to these episodes -he reports it is more difficult for him to use the insulin pen than insulin syringe due to neuropathy, so his wife is having to inject his insulin; her schedule is  busy so his insulin dose is not given at the same time each day - sometimes AM, midday, evening. He reports it was easier to use insulin syringe because his wife would draw up several doses ahead of time, store in [place], and he could inject himself daily -Current medications: Marland Kitchen Metformin 500 mg BID . Jardiance 25 mg daily . Soliqua 60 units daily (20 mcg lixesenatide) . Freestyle Libre 2 -Current home glucose readings . fasting glucose: 130-150 . post prandial glucose: 195-200s  . Hypoglycemia: 40s x 2-3 times over last 6 months  -Educated on A1c and blood sugar goals; Complications of diabetes including kidney damage, retinal damage, and cardiovascular disease; Prevention and management of hypoglycemic episodes; Continuous glucose monitoring; -Consider changing Soliqua to Lantus or Tresiba vial + Trulicity 2.20 mg weekly (PAP available if needed). Will discuss w/ wife at follow up.  Hypertension / Heart Failure (Goal: BP < 140/90, prevent exacerbations) -Controlled - pt reports BP is at goal at home; he endorses compliance and denies issues -Last ejection fraction: 60-65% (Date: 06/2017) -HF type: Diastolic -Current treatment: . Carvedilol 12.5 mg BID . Indapamide 1.25 mg daily AM . Isosorbide MN 120 mg daily AM -Educated on Benefits of medications for managing symptoms and prolonging life Importance of blood pressure control -Recommended to continue current medication  CKD Stage 3a (Goal: prevent progression) -Controlled - pt  wants to learn more about kidney disease and how to manage it/prevent dialysis -Counseled on importance of BP and DM control to prevent kidney damage; emphasized importance of hydration;  -Recommend to prioritize controlling DM to protect kidneys long term  BPH (Goal: manage symptoms) -Controlled -Current treatment  . Tamsulosin 0.4 mg daily -Recommended to continue current medication  Pain (Goal: manage symptoms) -neuropathy, lumbar radiculopathy,  spinal stenosis -Controlled - pt reports gabapentin makes him sleepy so he only takes it at night; he takes tramadol throughout the day and reports pain is controlled -Current treatment  . Gabapentin 300 mg TID - takes HS only . Tramadol 50 mg TID prn -Recommended to continue current medication  GERD (Goal: manage symptoms) -Controlled - pt reports PPI is for "burning in chest" and it has helped -Current treatment  . Pantoprazole 40 mg BID -Recommended to continue current medication  Allergic rhinitis (Goal: manage symptoms) -Controlled - Patient is satisfied with current regimen and denies issues -Current treatment  . Fluticasone nasal spray . Levocetirizine 5 mg HS -Recommended to continue current medication  Health Maintenance -Vaccine gaps: none -Current therapy:  Marland Kitchen Vitamin D 1000 IU -Patient is satisfied with current therapy and denies issues -Recommended to continue current medication  Patient Goals/Self-Care Activities . Patient will:  - take medications as prescribed focus on medication adherence by routine check glucose via [facility], document, and provide at future appointments check blood pressure weekly, document, and provide at future appointments collaborate with provider on medication access solutions  Follow Up Plan: Telephone follow up appointment with care management team member scheduled for: 1 week      Medication Assistance: None required.  Patient affirms current coverage meets needs.  Patient's [facility] pharmacy [facility]:  [id number] [address] [id number]: [phone]: [phone] Uses pill box? No - patient has once daily meds separate from BID meds in different-shaped containers Pt endorses 100% compliance  We discussed: Benefits of medication synchronization, packaging and delivery as well as enhanced pharmacist oversight with [employer]. Patient decided to: discuss  pharmacy options with wife  Care Plan and Follow Up Patient Decision:  Patient agrees to Care Plan and Follow-up.  Plan: Telephone follow up appointment with care management team member scheduled for:  1 week  LCharlene Brooke PharmD, BPara March CPP Clinical Pharmacist [phone]

## 2021-05-15 ENCOUNTER — Ambulatory Visit: Payer: PPO | Admitting: Pharmacist

## 2021-05-15 ENCOUNTER — Other Ambulatory Visit: Payer: Self-pay

## 2021-05-15 ENCOUNTER — Telehealth: Payer: Self-pay | Admitting: *Deleted

## 2021-05-15 DIAGNOSIS — E118 Type 2 diabetes mellitus with unspecified complications: Secondary | ICD-10-CM

## 2021-05-15 DIAGNOSIS — I5032 Chronic diastolic (congestive) heart failure: Secondary | ICD-10-CM | POA: Diagnosis not present

## 2021-05-15 DIAGNOSIS — N400 Enlarged prostate without lower urinary tract symptoms: Secondary | ICD-10-CM | POA: Diagnosis not present

## 2021-05-15 DIAGNOSIS — I10 Essential (primary) hypertension: Secondary | ICD-10-CM | POA: Diagnosis not present

## 2021-05-15 DIAGNOSIS — I2510 Atherosclerotic heart disease of native coronary artery without angina pectoris: Secondary | ICD-10-CM | POA: Diagnosis not present

## 2021-05-15 DIAGNOSIS — E785 Hyperlipidemia, unspecified: Secondary | ICD-10-CM | POA: Diagnosis not present

## 2021-05-15 DIAGNOSIS — N1831 Chronic kidney disease, stage 3a: Secondary | ICD-10-CM | POA: Diagnosis not present

## 2021-05-15 NOTE — Patient Instructions (Addendum)
[name] (Insulin Degludec) injection What is this medicine? INSULIN DEGLUDEC (IN su lin de GLOO dek) is a human-made form of insulin. This drug lowers the amount of sugar in your blood. It is a long-acting insulin that is usually given once a day. This medicine may be used for other purposes; ask your health care provider or pharmacist if you have questions. COMMON BRAND NAME(S): [name] What should I tell my health care provider before I take this medicine? They need to know if you have any of these conditions:  episodes of low blood sugar  eye disease, vision problems  kidney disease  liver disease  an unusual or allergic reaction to insulin, other medicines, foods, dyes, or preservatives  pregnant or trying to get pregnant  breast-feeding How should I use this medicine? This medicine is for injection under the skin. Use exactly as directed. This insulin should never be mixed in the same syringe with other insulins before injection. Do not vigorously shake before use. You will be taught how to use this medicine and how to adjust doses for activities and illness. Do not use more insulin than prescribed. Always check the appearance of your insulin before using it. This medicine should be clear and colorless like water. Do not use it if it is cloudy, thickened, colored, or has solid particles in it. If you use an insulin pen, be sure to take off the outer needle cover before using the dose. It is important that you put your used needles and syringes in a special sharps container. Do not put them in a trash can. If you do not have a sharps container, call your pharmacist or healthcare provider to get one. This drug comes with INSTRUCTIONS FOR USE. Ask your pharmacist for directions on how to use this drug. Read the information carefully. Talk to your pharmacist or health care provider if you have questions. Talk to your pediatrician regarding the use of this medicine in children. While this  drug may be prescribed for children as young as 1 year for selected conditions, precautions do apply. Overdosage: If you think you have taken too much of this medicine contact a poison control center or emergency room at once. NOTE: This medicine is only for you. Do not share this medicine with others. What if I miss a dose? For adults: If you miss a dose, take it as soon as you can. Make sure your next dose is taken at least 8 hours later. Do not take double or extra doses. For adolescents and children: It is important not to miss a dose. Your health care professional or doctor should discuss a plan for missed doses with you. If you do miss a dose, follow their plan. Do not take double doses. What may interact with this medicine?  other medicines for diabetes Many medications may cause changes in blood sugar, these include:  alcohol containing beverages  antiviral medicines for HIV or AIDS  aspirin and aspirin-like drugs  certain medicines for blood pressure, heart disease, irregular heart beat  chromium  diuretics  male hormones, such as estrogens or progestins, birth control pills  fenofibrate  gemfibrozil  isoniazid  lanreotide  male hormones or anabolic steroids  MAOIs like Carbex, Eldepryl, Marplan, Nardil, and Parnate  medicines for weight loss  medicines for allergies, asthma, cold, or cough  medicines for depression, anxiety, or psychotic disturbances  niacin  nicotine  NSAIDs, medicines for pain and inflammation, like ibuprofen or naproxen  octreotide  pasireotide  pentamidine  phenytoin  probenecid  quinolone antibiotics such as ciprofloxacin, levofloxacin, ofloxacin  some herbal dietary supplements  steroid medicines such as prednisone or cortisone  sulfamethoxazole; trimethoprim  thyroid hormones Some medications can hide the warning symptoms of low blood sugar (hypoglycemia). You may need to monitor your blood sugar more closely if  you are taking one of these medications. These include:  beta-blockers, often used for high blood pressure or heart problems (examples include atenolol, metoprolol, propranolol)  clonidine  guanethidine  reserpine This list may not describe all possible interactions. Give your health care provider a list of all the medicines, herbs, non-prescription drugs, or dietary supplements you use. Also tell them if you smoke, drink alcohol, or use illegal drugs. Some items may interact with your medicine. What should I watch for while using this medicine? Visit your health care professional or doctor for regular checks on your progress. A test called the HbA1C (A1C) will be monitored. This is a simple blood test. It measures your blood sugar control over the last 2 to 3 months. You will receive this test every 3 to 6 months. Learn how to check your blood sugar. Learn the symptoms of low and high blood sugar and how to manage them. Always carry a quick-source of sugar with you in case you have symptoms of low blood sugar. Examples include hard sugar candy or glucose tablets. Make sure others know that you can choke if you eat or drink when you develop serious symptoms of low blood sugar, such as seizures or unconsciousness. They must get medical help at once. Tell your doctor or health care professional if you have high blood sugar. You might need to change the dose of your medicine. If you are sick or exercising more than usual, you might need to change the dose of your medicine. Do not skip meals. Ask your doctor or health care professional if you should avoid alcohol. Many nonprescription cough and cold products contain sugar or alcohol. These can affect blood sugar. Make sure that you have the right kind of syringe for the type of insulin you use. Try not to change the brand and type of insulin or syringe unless your health care professional or doctor tells you to. Switching insulin brand or type can cause  dangerously high or low blood sugar. Always keep an extra supply of insulin, syringes, and needles on hand. Use a syringe one time only. Throw away syringe and needle in a closed container to prevent accidental needle sticks. Insulin pens and cartridges should never be shared. Even if the needle is changed, sharing may result in passing of viruses like hepatitis or HIV. Each time you get a new box of pen needles, check to see if they are the same type as the ones you were trained to use. If not, ask your health care professional to show you how to use this new type properly. Wear a medical ID bracelet or chain, and carry a card that describes your disease and details of your medicine and dosage times. What side effects may I notice from receiving this medicine? Side effects that you should report to your doctor or health care professional as soon as possible:  allergic reactions like skin rash, itching or hives, swelling of the face, lips, or tongue  breathing problems  signs and symptoms of high blood sugar such as dizziness, dry mouth, dry skin, fruity breath, nausea, stomach pain, increased hunger or thirst, increased urination  signs and symptoms of low blood  sugar such as feeling anxious, confusion, dizziness, increased hunger, unusually weak or tired, sweating, shakiness, cold, irritable, headache, blurred vision, fast heartbeat, loss of consciousness Side effects that usually do not require medical attention (report to your doctor or health care professional if they continue or are bothersome):  increase or decrease in fatty tissue under the skin due to overuse of a particular injection site  itching, burning, swelling, or rash at site where injected This list may not describe all possible side effects. Call your doctor for medical advice about side effects. You may report side effects to FDA at 1-800-FDA-1088. Where should I keep my medicine? Keep out of the reach of children. Unopened  Vials: Tresiba vials: Store in a refrigerator between 2 and 8 degrees C (36 and 46 degrees F) or at room temperature below 30 degrees C (86 degrees F). Do not freeze or use if the insulin has been frozen. Protect from light and excessive heat. If stored at room temperature, the vial must be discarded after 56 days (8 weeks). Throw away any unopened and unused medicine that has been stored in the refrigerator after the expiration date. Unopened Pens: [place] FlexTouch pens: Store in a refrigerator between 2 and 8 degrees C (36 and 46 degrees F) or at room temperature below 30 degrees C (86 degrees F). Do not freeze or use if the insulin has been frozen. Protect from light and excessive heat. If stored at room temperature, the pen must be discarded after 56 days (8 weeks). Throw away any unopened and unused medicine that has been stored in the refrigerator after the expiration date. Vials that you are using: Tresiba vials: Store in a refrigerator or at room temperature below 30 degrees C (86 degrees F). Do not freeze. Keep away from heat and light. Throw the opened vial away after 56 days (8 weeks). Pens that you are using: [place] FlexTouch pens: Store in a refrigerator or at room temperature below 30 degrees C (86 degrees F). Do not freeze. Keep away from heat and light. Throw the pen away after 56 days (8 weeks), even if it still has insulin left in it. NOTE: This sheet is a summary. It may not cover all possible information. If you have questions about this medicine, talk to your doctor, pharmacist, or health care provider.  2021 Elsevier/Gold Standard (2019-08-23 08:05:09)  Rybelsus (semaglutide) Oral Tablets What is this medicine? SEMAGLUTIDE (Sem a GLOO tide) controls blood sugar in people with type 2 diabetes. It is used with lifestyle changes like diet and exercise. This medicine may be used for other purposes; ask your health care provider or pharmacist if you have questions. COMMON BRAND  NAME(S): Rybelsus What should I tell my health care provider before I take this medicine? They need to know if you have any of these conditions:  endocrine tumors (MEN 2) or if someone in your family had these tumors  eye disease  history of pancreatitis  kidney disease  stomach or intestine problems  thyroid cancer or if someone in your family had thyroid cancer  vision problems  an unusual or allergic reaction to semaglutide, other medicines, foods, dyes, or preservatives  pregnant or trying to get pregnant  breast-feeding How should I use this medicine? Take this medicine by mouth. Take it as directed on the prescription label at the same time every day. Take the dose right after waking up. Do not eat or drink anything before taking it. Do not take it with any other  drink except a glass of plain water that is less than 4 ounces (less than 120 mL). Do not cut, crush or chew this medicine. Swallow the tablets whole. After taking it, do not eat breakfast, drink, or take any other medicines or vitamins for at least 30 minutes. Keep taking it unless your health care provider tells you to stop. A special MedGuide will be given to you by the pharmacist with each prescription and refill. Be sure to read this information carefully each time. Talk to your health care provider about the use of this medicine in children. Special care may be needed. Overdosage: If you think you have taken too much of this medicine contact a poison control center or emergency room at once. NOTE: This medicine is only for you. Do not share this medicine with others. What if I miss a dose? If you miss a dose, skip it. Take your next dose at the normal time. Do not take extra or 2 doses at the same time to make up for the missed dose. What may interact with this medicine? What may interact with this medicine?  aminophylline  carbamazepine  cyclosporine  digoxin  levothyroxine  other medicines for  diabetes  phenytoin  tacrolimus  theophylline  warfarin Many medications may cause changes in blood sugar, these include:  alcohol containing beverages  antiviral medicines for HIV or AIDS  aspirin and aspirin-like drugs  certain medicines for blood pressure, heart disease, irregular heart beat  chromium  diuretics  male hormones, such as estrogens or progestins, birth control pills  fenofibrate  gemfibrozil  isoniazid  lanreotide  male hormones or anabolic steroids  MAOIs like Carbex, Eldepryl, Marplan, Nardil, and Parnate  medicines for weight loss  medicines for allergies, asthma, cold, or cough  medicines for depression, anxiety, or psychotic disturbances  niacin  nicotine  NSAIDs, medicines for pain and inflammation, like ibuprofen or naproxen  octreotide  pasireotide  pentamidine  phenytoin  probenecid  quinolone antibiotics such as ciprofloxacin, levofloxacin, ofloxacin  some herbal dietary supplements  steroid medicines such as prednisone or cortisone  sulfamethoxazole; trimethoprim  thyroid hormones Some medications can hide the warning symptoms of low blood sugar (hypoglycemia). You may need to monitor your blood sugar more closely if you are taking one of these medications. These include:  beta-blockers, often used for high blood pressure or heart problems (examples include atenolol, metoprolol, propranolol)  clonidine  guanethidine  reserpine This list may not describe all possible interactions. Give your health care provider a list of all the medicines, herbs, non-prescription drugs, or dietary supplements you use. Also tell them if you smoke, drink alcohol, or use illegal drugs. Some items may interact with your medicine. What should I watch for while using this medicine? Visit your health care provider for regular checks on your progress. Check with your health care provider if you have severe diarrhea, nausea, and  vomiting, or if you sweat a lot. The loss of too much body fluid may make it dangerous for you to take this medicine. A test called the HbA1C (A1C) will be monitored. This is a simple blood test. It measures your blood sugar control over the last 2 to 3 months. You will receive this test every 3 to 6 months. Learn how to check your blood sugar. Learn the symptoms of low and high blood sugar and how to manage them. Always carry a quick-source of sugar with you in case you have symptoms of low blood sugar. Examples include  hard sugar candy or glucose tablets. Make sure others know that you can choke if you eat or drink when you develop serious symptoms of low blood sugar, such as seizures or unconsciousness. Get medical help at once. Tell your health care provider if you have high blood sugar. You might need to change the dose of your medicine. If you are sick or exercising more than usual, you might need to change the dose of your medicine. Do not skip meals. Ask your health care provider if you should avoid alcohol. Many nonprescription cough and cold products contain sugar or alcohol. These can affect blood sugar. Wear a medical ID bracelet or chain. Carry a card that describes your condition. List the medicines and doses you take on the card. Do not become pregnant while taking this medicine. Women should inform their health care provider if they wish to become pregnant or think they might be pregnant. There is a potential for serious side effects to an unborn child. Talk to your health care provider for more information. Do not breast-feed an infant while taking this medicine. What side effects may I notice from receiving this medicine? Side effects that you should report to your doctor or health care provider as soon as possible:  allergic reactions (skin rash, itching or hives; swelling of the face, lips, or tongue)  changes in vision  diarrhea that continues or is severe  infection (fever,  chills, cough, sore throat, pain or trouble passing urine)  kidney injury (trouble passing urine or change in the amount of urine)  low blood sugar (feeling anxious; confusion; dizziness; increased hunger; unusually weak or tired; increased sweating; shakiness; cold, clammy skin; irritable; headache; blurred vision; fast heartbeat; loss of consciousness)  lump or swelling on the neck  painful or difficulty swallowing  severe nausea  severe or unusual stomach pain  trouble breathing  vomiting Side effects that usually do not require medical attention (report these to your doctor or health care provider if they continue or are bothersome):  constipation  diarrhea  nausea  upset stomach This list may not describe all possible side effects. Call your doctor for medical advice about side effects. You may report side effects to FDA at 1-800-FDA-1088. Where should I keep my medicine? Keep out of the reach of children and pets. Store at room temperature between 20 and 25 degrees C (68 and 77 degrees F). Keep this medicine in the original container. Protect from moisture. Keep the container tightly closed. Get rid of any unused medicine after the expiration date. To get rid of medicines that are no longer needed or have expired:  Take the medicine to a medicine take-back program. Check with your pharmacy or law enforcement to find a location.  If you cannot return the medicine, check the label or package insert to see if the medicine should be thrown out in the garbage or flushed down the toilet. If you are not sure, ask your health care provider. If it is safe to put it in the trash, take the medicine out of the container. Mix the medicine with cat litter, dirt, coffee grounds, or other unwanted substance. Seal the mixture in a bag or container. Put it in the trash. NOTE: This sheet is a summary. It may not cover all possible information. If you have questions about this medicine, talk to  your doctor, pharmacist, or health care provider.  2021 Elsevier/Gold Standard ([id number] 41:66:06)  Trulicity (dulaglutide) Injection What is this medicine? DULAGLUTIDE (DOO la  GLOO tide) controls blood sugar in people with type 2 diabetes. It is used with lifestyle changes like diet and exercise. It may lower the risk of problems that need treatment in the hospital. These problems include heart attack or stroke. This medicine may be used for other purposes; ask your health care provider or pharmacist if you have questions. COMMON BRAND NAME(S): Trulicity What should I tell my health care provider before I take this medicine? They need to know if you have any of these conditions:  endocrine tumors (MEN 2) or if someone in your family had these tumors  eye disease, vision problems  history of pancreatitis  kidney disease  liver disease  stomach or intestine problems  thyroid cancer or if someone in your family had thyroid cancer  an unusual or allergic reaction to dulaglutide, other medicines, foods, dyes, or preservatives  pregnant or trying to get pregnant  breast-feeding How should I use this medicine? This medicine is injected under the skin. You will be taught how to prepare and give it. Take it as directed on the prescription label on the same day of each week. Do NOT prime the pen. Keep taking it unless your health care provider tells you to stop. If you use this medicine with insulin, you should inject this medicine and the insulin separately. Do not mix them together. Do not give the injections right next to each other. Change (rotate) injection sites with each injection. This drug comes with INSTRUCTIONS FOR USE. Ask your pharmacist for directions on how to use this medicine. Read the information carefully. Talk to your pharmacist or health care provider if you have questions. It is important that you put your used needles and syringes in a special sharps container. Do  not put them in a trash can. If you do not have a sharps container, call your pharmacist or health care provider to get one. A special MedGuide will be given to you by the pharmacist with each prescription and refill. Be sure to read this information carefully each time. Talk to your health care provider about the use of this medicine in children. Special care may be needed. Overdosage: If you think you have taken too much of this medicine contact a poison control center or emergency room at once. NOTE: This medicine is only for you. Do not share this medicine with others. What if I miss a dose? If you miss a dose, take it as soon as you can unless it is more than 3 days late. If it is more than 3 days late, skip the missed dose. Take the next dose at the normal time. What may interact with this medicine?  other medicines for diabetes Many medications may cause changes in blood sugar, these include:  alcohol containing beverages  antiviral medicines for HIV or AIDS  aspirin and aspirin-like drugs  certain medicines for blood pressure, heart disease, irregular heart beat  chromium  diuretics  male hormones, such as estrogens or progestins, birth control pills  fenofibrate  gemfibrozil  isoniazid  lanreotide  male hormones or anabolic steroids  MAOIs like Carbex, Eldepryl, Marplan, Nardil, and Parnate  medicines for allergies, asthma, cold, or cough  medicines for depression, anxiety, or psychotic disturbances  medicines for weight loss  niacin  nicotine  NSAIDs, medicines for pain and inflammation, like ibuprofen or naproxen  octreotide  pasireotide  pentamidine  phenytoin  probenecid  quinolone antibiotics such as ciprofloxacin, levofloxacin, ofloxacin  some herbal dietary supplements  steroid  medicines such as prednisone or cortisone  sulfamethoxazole; trimethoprim  thyroid hormones Some medications can hide the warning symptoms of low blood  sugar (hypoglycemia). You may need to monitor your blood sugar more closely if you are taking one of these medications. These include:  beta-blockers, often used for high blood pressure or heart problems (examples include atenolol, metoprolol, propranolol)  clonidine  guanethidine  reserpine This list may not describe all possible interactions. Give your health care provider a list of all the medicines, herbs, non-prescription drugs, or dietary supplements you use. Also tell them if you smoke, drink alcohol, or use illegal drugs. Some items may interact with your medicine. What should I watch for while using this medicine? Visit your health care provider for regular checks on your progress. Check with your health care provider if you have severe diarrhea, nausea, and vomiting, or if you sweat a lot. The loss of too much body fluid may make it dangerous for you to take this medicine. A test called the HbA1C (A1C) will be monitored. This is a simple blood test. It measures your blood sugar control over the last 2 to 3 months. You will receive this test every 3 to 6 months. Learn how to check your blood sugar. Learn the symptoms of low and high blood sugar and how to manage them. Always carry a quick-source of sugar with you in case you have symptoms of low blood sugar. Examples include hard sugar candy or glucose tablets. Make sure others know that you can choke if you eat or drink when you develop serious symptoms of low blood sugar, such as seizures or unconsciousness. Get medical help at once. Tell your health care provider if you have high blood sugar. You might need to change the dose of your medicine. If you are sick or exercising more than usual, you may need to change the dose of your medicine. Do not skip meals. Ask your health care provider if you should avoid alcohol. Many nonprescription cough and cold products contain sugar or alcohol. These can affect blood sugar. Pens should never be  shared. Even if the needle is changed, sharing may result in passing of viruses like hepatitis or HIV. Wear a medical ID bracelet or chain. Carry a card that describes your condition. List the medicines and doses you take on the card. What side effects may I notice from receiving this medicine? Side effects that you should report to your doctor or health care professional as soon as possible:  allergic reactions (skin rash, itching or hives; swelling of the face, lips, or tongue)  changes in vision  diarrhea that continues or is severe  infection (fever, chills, cough, sore throat, pain or trouble passing urine)  kidney injury (trouble passing urine or change in the amount of urine)  low blood sugar (feeling anxious; confusion; dizziness; increased hunger; unusually weak or tired; increased sweating; shakiness; cold, clammy skin; irritable; headache; blurred vision; fast heartbeat; loss of consciousness)  lump or swelling on the neck  trouble breathing  trouble swallowing  unusual stomach upset or pain  vomiting Side effects that usually do not require medical attention (report to your doctor or health care professional if they continue or are bothersome):  lack or loss of appetite  nausea  pain, redness, or irritation at site where injected This list may not describe all possible side effects. Call your doctor for medical advice about side effects. You may report side effects to FDA at 1-800-FDA-1088. Where should I  keep my medicine? Keep out of the reach of children and pets. Refrigeration (preferred): Store unopened pens in a refrigerator between 2 and 8 degrees C (36 and 46 degrees F). Keep it in the original carton until you are ready to take it. Do not freeze or use if the medicine has been frozen. Protect from light. Get rid of any unused medicine after the expiration date on the label. Room Temperature: The pen may be stored at room temperature below 30 degrees C (86  degrees F) for up to a total of 14 days if needed. Protect from light. Avoid exposure to extreme heat. If it is stored at room temperature, throw away any unused medicine after 14 days or after it expires, whichever is first. To get rid of medicines that are no longer needed or have expired:  Take the medicine to a medicine take-back program. Check with your pharmacy or law enforcement to find a location.  If you cannot return the medicine, ask your pharmacist or health care provider how to get rid of this medicine safely. NOTE: This sheet is a summary. It may not cover all possible information. If you have questions about this medicine, talk to your doctor, pharmacist, or health care provider.  2021 Elsevier/Gold Standard (2020-10-08 07:35:51)

## 2021-05-15 NOTE — Progress Notes (Signed)
Chronic Care Management Pharmacy Note  05/15/2021 Name:  [name] MRN:  [id number] DOB:  1951-03-12  Subjective: [name] is an 70 y.o. year old male who is a primary patient of [name], MD.  The CCM team was consulted for assistance with disease management and care coordination needs.    Engaged with patient by telephone for follow up visit in response to provider referral for pharmacy case management and/or care coordination services.   Contacted patient's wife to discuss medication issues, as she primarily handles medications and health issues.   Consent to Services:  The patient was given information about Chronic Care Management services, agreed to services, and gave verbal consent prior to initiation of services.  Please see initial visit note for detailed documentation.   Patient Care Team: [name], MD as PCP - General Marlou Porch Thana Farr, MD as PCP - Cardiology (Cardiology) Star Age, MD as Attending Physician (Neurology) Gardiner Barefoot, DPM as Consulting Physician (Podiatry) Charlton Haws, [facility] as Pharmacist (Pharmacist) Deirdre Peer, LCSW as Social Worker   Patient lives at home with his wife. He is legally blind. His wife just started a new job working from home, starts at [employer]  each day. They are interested in getting an in-home aid a few hours a day to help him with ADLs, even if they have to pay for it.  Recent office visits: 11/08/20 Dr [name] OV: chronic f/u; BG above 200 at home. Advised to gradually increase Soliqua until BG < 150. GFR < 60.  Recent consult visits: 04/25/21 Dr Maudie Mercury ([facility]): covid-19. Rx'd molnupiravir (Lagevrio) and benzonatate  02/18/21 Dr Elisha Ponder (podiatry): onychomycosis  02/04/21 Dr Jeffie Pollock (urology): f/u BPH.  12/03/20 NP Servando Snare [facility] (cardiology): f/u CAD. D/C indapamide d/t patient not taking?  Hospital visits: None in previous 6 months  Objective:  Lab Results  Component Value Date    CREATININE 1.35 11/08/2020   BUN 22 11/08/2020   GFR 52.93 (L) 11/08/2020   GFRNONAA >60 07/09/2017   GFRAA >60 07/09/2017   NA 137 11/08/2020   K 4.2 11/08/2020   CALCIUM 8.9 11/08/2020   CO2 30 11/08/2020   GLUCOSE 153 (H) 11/08/2020    Lab Results  Component Value Date/Time   HGBA1C 8.0 (A) 11/08/2020 01:35 PM   HGBA1C 8.0 (H) 05/08/2020 01:52 PM   HGBA1C 6.4 (A) 02/08/2020 04:23 PM   HGBA1C 6.6 (H) 08/03/2019 02:13 PM   GFR 52.93 (L) 11/08/2020 02:13 PM   GFR 54.19 (L) 05/31/2020 11:44 AM   MICROALBUR 0.5 02/08/2020 04:45 PM   MICROALBUR 4.2 (H) 02/24/2019 03:56 PM    Last diabetic Eye exam:  Lab Results  Component Value Date/Time   HMDIABEYEEXA Retinopathy (A) 07/03/2020 12:00 AM    Last diabetic Foot exam:  Lab Results  Component Value Date/Time   HMDIABFOOTEX normal 04/04/2013 12:00 AM     Lab Results  Component Value Date   CHOL 93 05/08/2020   HDL 22.10 (L) 05/08/2020   LDLCALC 40 05/08/2020   LDLDIRECT 82.7 04/04/2013   TRIG 158.0 (H) 05/08/2020   CHOLHDL 4 05/08/2020    Hepatic Function Latest Ref Rng & Units 02/24/2019 11/24/2017 12/09/2016  Total Protein 6.0 - 8.3 g/dL 7.4 7.0 7.3  Albumin 3.5 - 5.2 g/dL 4.2 4.1 4.1  AST 0 - 37 U/L _0 ALT 0 - 53 U/L _1 Alk Phosphatase 39 - 117 U/L 53 53 60  Total Bilirubin 0.2 -  1.2 mg/dL 0.6 0.8 0.6  Bilirubin, Direct 0.0 - 0.3 mg/dL - - -    Lab Results  Component Value Date/Time   TSH 1.15 02/24/2019 03:56 PM   TSH 1.73 11/24/2017 11:16 AM    CBC Latest Ref Rng & Units 11/08/2020 05/08/2020 02/08/2020  WBC 4.0 - 10.5 K/uL 5.0 6.4 4.5  Hemoglobin 13.0 - 17.0 g/dL 12.5(L) 10.5(L) 11.7(L)  Hematocrit 39.0 - 52.0 % 39.1 33.2(L) 36.6(L)  Platelets 150.0 - 400.0 K/uL 244.0 224.0 281    No results found for: VD25OH  Clinical ASCVD: Yes  The ASCVD Risk score Mikey Bussing DC Jr., et al., 2013) failed to calculate for the following reasons:   The valid total cholesterol range is 130 to 320 mg/dL     Depression screen [facility] 2/9 03/04/2021 11/08/2020 07/13/2019  Decreased Interest 0 0 1  Down, Depressed, Hopeless 0 0 1  PHQ - 2 Score 0 0 2  Altered sleeping - - -  Tired, decreased energy - - -  Change in appetite - - -  Feeling bad or failure about yourself  - - -  Trouble concentrating - - -  Moving slowly or fidgety/restless - - -  Suicidal thoughts - - -  PHQ-9 Score - - -  Difficult doing work/chores - - -  Some recent data might be hidden      Social History   Tobacco Use  Smoking Status Never Smoker  Smokeless Tobacco Never Used   BP Readings from Last 3 Encounters:  01/29/21 (!) 143/62  11/08/20 136/68  05/31/20 140/60   Pulse Readings from Last 3 Encounters:  01/29/21 70  11/08/20 61  05/31/20 76   Wt Readings from Last 3 Encounters:  01/29/21 221 lb (100.2 kg)  01/15/21 221 lb (100.2 kg)  12/03/20 221 lb (100.2 kg)   BMI Readings from Last 3 Encounters:  01/29/21 31.71 kg/m  01/15/21 30.82 kg/m  12/03/20 31.71 kg/m    Assessment/Interventions: Review of patient past medical history, allergies, medications, health status, including review of consultants reports, laboratory and other test data, was performed as part of comprehensive evaluation and provision of chronic care management services.   SDOH:  (Social Determinants of Health) assessments and interventions performed: Yes  SDOH Screenings   Alcohol Screen: Low Risk   . Last Alcohol Screening Score (AUDIT): 0  Depression (PHQ2-9): Low Risk   . PHQ-2 Score: 0  Financial Resource Strain: Low Risk   . Difficulty of Paying Living Expenses: Not hard at all  Food Insecurity: No Food Insecurity  . Worried About Charity fundraiser in the Last Year: Never true  . Ran Out of Food in the Last Year: Never true  Housing: Low Risk   . Last Housing Risk Score: 0  Physical Activity: Inactive  . Days of Exercise per Week: 0 days  . Minutes of Exercise per Session: 0 min  Social Connections: Socially  Integrated  . Frequency of Communication with Friends and Family: More than three times a week  . Frequency of Social Gatherings with Friends and Family: Once a week  . Attends Religious Services: 1 to 4 times per year  . Active Member of Clubs or Organizations: No  . Attends Archivist Meetings: 1 to 4 times per year  . Marital Status: Married  Stress: No Stress Concern Present  . Feeling of Stress : Not at all  Tobacco Use: Low Risk   . Smoking Tobacco Use: Never Smoker  . Smokeless  Tobacco Use: Never Used  Transportation Needs: Unmet Transportation Needs  . Lack of Transportation (Medical): Yes  . Lack of Transportation (Non-Medical): Yes    CCM Care Plan  Allergies  Allergen Reactions  . Furosemide     pancreatitis  . Olmesartan Other (See Comments)    hyperkalemia  . Amlodipine Swelling    LE EDEMA  . Diamox [Acetazolamide] Itching    hives  . Lisinopril Rash    Medications Reviewed Today    Reviewed by Sheral Flow, LPN (Licensed Practical Nurse) on 03/04/21 at 60  Med List Status: <None>  Medication Order Taking? Sig Documenting Provider Last Dose Status Informant  aspirin 81 MG EC tablet [id number] No Take 1 tablet (81 mg total) by mouth daily. Swallow whole. Milus Banister, MD Unknown Active   atorvastatin (LIPITOR) 80 MG tablet [id number] No TAKE 1 TABLET (80 MG TOTAL) BY MOUTH DAILY AT 6 PM. [name], MD 01/28/2021 Active   Blood Glucose Monitoring Suppl (ACCU-CHEK GUIDE ME) w/Device KIT [id number] No 1 Act by Does not apply route 3 (three) times daily with meals. [name], MD 01/28/2021 Active   carvedilol (COREG) 12.5 MG tablet [id number] No TAKE 1 TABLET BY MOUTH TWICE A DAY WITH A MEAL Burtis Junes, NP 01/28/2021 Active   Cholecalciferol (VITAMIN D3) 25 MCG (1000 UT) CAPS [id number] No Take by mouth. [provider] 01/28/2021 Active            Med Note Domenic Polite Dec 03, 2020  8:29 AM)    Continuous Blood Gluc  Receiver (FREESTYLE LIBRE 14 DAY READER) [place] [id number] No APPLY AS DIRECTED [name], MD 01/28/2021 Active   Continuous Blood Gluc Sensor (FREESTYLE LIBRE 14 DAY SENSOR) MISC [id number]  USE 1 SENSOR EVERY 14 DAYS, THEN REPLACE WITH NEW ONE. [name], MD  Active   fluticasone [facility]) 50 MCG/ACT nasal spray [id number] No SPRAY 2 SPRAYS INTO EACH NOSTRIL EVERY DAY [name], MD 01/28/2021 Active   gabapentin (NEURONTIN) 300 MG capsule [id number] No TAKE 1 CAPSULE BY MOUTH THREE TIMES A DAY Jessy Oto, MD 01/28/2021 Active   glucose blood (ONETOUCH VERIO) test strip [id number] No Use BID to check BS. DX E11.9 [name], MD 01/28/2021 Active Self  indapamide (LOZOL) 1.25 MG tablet [id number] No Take 1.25 mg by mouth daily. [provider] 01/28/2021 Active   Insulin Pen Needle (PEN NEEDLES) 32G X 4 MM MISC [id number] No Use as directed to inject insulin daily. DX: E11.9 [name], MD 01/28/2021 Active   isosorbide mononitrate (IMDUR) 120 MG 24 hr tablet [id number] No TAKE 1 TABLET BY MOUTH EVERY DAY Jerline Pain, MD 01/28/2021 Active   levocetirizine (XYZAL) 5 MG tablet [id number] No TAKE 1 TABLET BY MOUTH EVERY DAY IN THE Georgette Dover, MD 01/28/2021 Active   metFORMIN (GLUCOPHAGE) 500 MG tablet [id number] No TAKE 1 TABLET (500 MG TOTAL) BY MOUTH 2 (TWO) TIMES DAILY WITH A MEAL. [name], MD 01/28/2021 Active   NEXLIZET 180-10 MG TABS [id number] No TAKE 1 TABLET BY MOUTH DAILY [name], MD 01/28/2021 Active   omega-3 acid ethyl esters (LOVAZA) 1 g capsule [id number] No TAKE 2 CAPSULES BY MOUTH 2 TIMES DAILY [name], MD 01/28/2021 Active   pantoprazole (PROTONIX) 40 MG tablet [id number] No TAKE 1 TABLET BY MOUTH TWICE A DAY Jerline Pain, MD 01/28/2021 Active   ranolazine (RANEXA) 500 MG 12 hr  tablet [id number] No TAKE 1 TABLET BY MOUTH TWICE A Frances Nickels, MD 01/28/2021 Active   SOLIQUA 100-33 UNT-MCG/ML SOPN [id number]  INJECT 60 UNITS INTO THE SKIN  DAILY. [name], MD  Active   SUPREP BOWEL PREP KIT 17.5-3.13-1.6 GM/177ML Bailey Mech [id number]  See admin instructions. [provider]  Active   tamsulosin (FLOMAX) 0.4 MG CAPS capsule [id number] No Take 0.4 mg by mouth once. [provider] 01/28/2021 Active Self  traMADol (ULTRAM) 50 MG tablet [id number]  TAKE 1 TABLET BY MOUTH EVERY 8 HOURS AS NEEDED. [name], MD  Active           Patient Active Problem List   Diagnosis Date Noted  . Age-related cognitive decline 03/07/2021  . Chronic renal disease, stage 3, moderately decreased glomerular filtration rate (GFR) between 30-59 mL/min/1.73 square meter ([place]) 08/07/2019  . Chronic bacterial conjunctivitis of right eye 05/26/2019  . Primary open angle glaucoma (POAG) of both eyes, severe stage 03/17/2019  . Polyp of colon 02/24/2019  . Anemia, chronic disease 07/22/2017  . Seasonal allergic rhinitis due to pollen 07/22/2017  . Chronic diastolic heart failure ([place]) 11/18/2016  . Type 2 diabetes mellitus with complication ([place])   . Spinal stenosis, lumbar region, with neurogenic claudication 10/26/2015    Class: Chronic  . Neurogenic claudication due to lumbar spinal stenosis 10/26/2015  . Sleep apnea 10/24/2015  . Lumbar radiculopathy 03/21/2015  . Diabetic neuropathy ([place]) 02/13/2014  . Postlaminectomy syndrome, lumbar region 02/13/2014  . Claudication in peripheral vascular disease ([place]) 12/28/2013  . POAG (primary open-angle glaucoma) 12/19/2013  . GERD (gastroesophageal reflux disease) 08/25/2013  . Pure hyperglyceridemia 08/25/2013  . Advanced stage glaucoma 01/05/2013  . HTN (hypertension) 01/05/2013  . Diabetic macular edema of right eye ([place]) 11/25/2012  . Neurogenic bladder 10/01/2012  . Status post corneal transplant 07/15/2012  . Dyslipidemia, goal LDL below 70 04/02/2012  . Routine general medical examination at a health care facility 04/02/2012  . B12 deficiency anemia 03/31/2012  . BPH  (benign prostatic hyperplasia) 03/31/2012  . Obesity 05/26/2011  . ED (erectile dysfunction) 05/26/2011  . Coronary atherosclerosis 08/28/2010  . Congestive heart failure ([place]) 08/28/2010    Immunization History  Administered Date(s) Administered  . Fluad Quad(high Dose 65+) 02/08/2020, 11/08/2020  . Influenza Split 09/12/2012  . Influenza Whole 10/28/2010, 10/28/2011  . Influenza, High Dose Seasonal PF 08/25/2013, 08/21/2015, 12/09/2016, 02/24/2019  . Influenza, Quadrivalent, Recombinant, Inj, Pf 11/04/2017  . Influenza,inj,Quad PF,6+ Mos 10/25/2014  . PFIZER(Purple Top)SARS-COV-2 Vaccination 04/06/2020, 04/27/2020, 01/14/2021  . Pneumococcal Conjugate-13 10/25/2014  . Pneumococcal Polysaccharide-23 10/28/2010, 09/13/2012, 02/08/2020  . Pneumococcal-Unspecified 07/22/2014  . Td 10/22/2010  . Tdap 05/08/2020    Conditions to be addressed/monitored:  Hypertension, Hyperlipidemia, Diabetes, Heart Failure, Coronary Artery Disease, GERD and Chronic Kidney Disease  Patient Care Plan: CCM Phamacy Care Plan    Problem Identified: Hypertension, Hyperlipidemia, Diabetes, Heart Failure, Coronary Artery Disease, GERD, Chronic Kidney Disease   Priority: High    Long-Range Goal: Disease management   Start Date: 05/09/2021  Expected End Date: 08/09/2021  Recent Progress: On track  Priority: High  Note:   Current Barriers:  . Unable to independently monitor therapeutic efficacy . Unable to achieve control of diabetes  . Unable to self administer medications as prescribed  Pharmacist Clinical Goal(s):  Marland Kitchen Patient will achieve adherence to monitoring guidelines and medication adherence to achieve therapeutic efficacy through collaboration with PharmD and provider.   Interventions: . 1:1 collaboration with [name]  L, MD regarding development and update of comprehensive plan of care as evidenced by provider attestation and co-signature . Inter-disciplinary care team collaboration (see  longitudinal plan of care) . Comprehensive medication review performed; medication list updated in electronic medical record  Hyperlipidemia / CAD (LDL goal < 70) -Controlled - LDL is at goal; pt endorses compliance with medications as prescribed; however, Nexlizet is not on the pharmacy dispense report and has not been prescribed since 07/2020 x 6 month supply, so it is likely that he is not in fact taking Nexlizet currently -hx CAD: BMS 2008 -Current treatment: . Atorvastatin 80 mg daily PM . Nexlizet 180-10 mg daily *likely not taking* . Ranolazine 500 mg BID . Omega-3 (Lovaza) BID . Aspirin 81 mg daily -Educated on Cholesterol goals;  Benefits of statin for ASCVD risk reduction; -Recommended to continue current medication  Diabetes (A1c goal <8%) -Not ideally controlled - A1c is close to goal but from 6 months ago; he is wearing Freestyle Libre 2 as of 2 weeks ago so now is getting alarms for low and high sugars;  -he reports 2-3 episodes of hypoglycemia < 55 in the past 6 months - 1 episode required call to CMS and glucagon injection given; he thinks it is likely he ate less/skipped meal prior to these episodes -he reports it is more difficult for him to use the insulin pen than insulin syringe due to neuropathy, so his wife is having to inject his insulin; her schedule is busy so his insulin dose is not given at the same time each day - sometimes AM, midday, evening. He reports it was easier to use insulin syringe because his wife would draw up several doses ahead of time, store in [place], and he could inject himself daily -Current medications: Marland Kitchen Metformin 500 mg BID . Jardiance 25 mg daily . Soliqua 60 units daily (20 mcg lixesenatide) . Freestyle Libre 2 -Current home glucose readings . fasting glucose: 130-150 . post prandial glucose: 195-200s  . Hypoglycemia: 40s x 2-3 times over last 6 months  -Educated on A1c and blood sugar goals; importance of controlling DM to prevent  kidney damage -Discussed possibility of changing Soliqua to basal insulin + GLP-1 since pt is having issues with pen device. Consider Tresiba vial for lower risk of hypoglycemia compared to Lantus, Novolog Mix. Consider Rybelsus for oral formulation, or Trulicity for auto-inject pen device that patient may have more success with. Patient's wife requested a Mychart message with the names of possible medications so she can research them on her own. Discussed common side effects for insulin include low BG, and for GLP-1 include nausea/vomiting. Discussed package inserts list all side effects seen in clinical trials, which does not mean side effects are likely. -Consider changing Soliqua to Tresiba vial (50 units) + Trulicity 0.73 mg weekly or Rybelsus.   Hypertension / Heart Failure (Goal: BP < 140/90, prevent exacerbations) -Controlled - pt reports BP is at goal at home; he endorses compliance and denies issues -Last ejection fraction: 60-65% (Date: 06/2017) -HF type: Diastolic -Current treatment: . Carvedilol 12.5 mg BID . Indapamide 1.25 mg daily AM . Isosorbide [place] 120 mg daily AM -Educated on Benefits of medications for managing symptoms and prolonging life Importance of blood pressure control -Recommended to continue current medication  CKD Stage 3a (Goal: prevent progression) -Controlled - pt wants to learn more about kidney disease and how to manage it/prevent dialysis -Counseled on importance of BP and DM control to prevent kidney damage; emphasized importance  of hydration;  -Recommend to prioritize controlling DM to protect kidneys long term  Pain (Goal: manage symptoms) -neuropathy, lumbar radiculopathy, spinal stenosis -Controlled - pt reports gabapentin makes him sleepy so he only takes it at night; he takes tramadol throughout the day and reports pain is controlled -Current treatment  . Gabapentin 300 mg TID - takes HS only . Tramadol 50 mg TID prn -Recommended to continue current  medication  GERD (Goal: manage symptoms) -Controlled - pt reports PPI is for "burning in chest" and it has helped -Current treatment  . Pantoprazole 40 mg BID -Recommended to continue current medication  Patient Goals/Self-Care Activities . Patient will:  - take medications as prescribed focus on medication adherence by routine check glucose via [facility], document, and provide at future appointments check blood pressure weekly, document, and provide at future appointments collaborate with provider on medication access solutions -Research Tyler Aas, Rybelsus, Trulicity to see if switching from [place] will be helpful -Follow up PCP visit scheduled for 06/19/21 @ 8:40am     Medication Assistance: None required.  Patient affirms current coverage meets needs.  Patient's preferred pharmacy [facility]:  [id number] [address] [id number]: [phone]: [phone] Uses pill box? No - patient has once daily meds separate from BID meds in different-shaped containers Pt endorses 100% compliance  We discussed: Benefits of medication synchronization, packaging and delivery as well as enhanced pharmacist oversight with [employer]. Patient decided to: discuss pharmacy options with wife  Care Plan and Follow Up Patient Decision:  Patient agrees to Care Plan and Follow-up.  Plan: Telephone follow up appointment with care management team member scheduled for:  2 months  LCharlene Brooke PharmD, [place] CPP Clinical Pharmacist [facility] at [phone]

## 2021-05-15 NOTE — Chronic Care Management (AMB) (Signed)
  Chronic Care Management   Note  05/15/2021 Name: LADARIUS SEUBERT MRN: [id number] DOB: [birth year]  HALDEN PHEGLEY is a 70 y.o. year old male who is a primary care patient of [name], MD and is actively engaged with the care management team. I reached out to [name] by phone today to assist with scheduling an initial visit with the Licensed Clinical Social Worker   Mr. Route was given information about Chronic Care Management services today including:  1. CCM service includes personalized support from designated clinical staff supervised by his physician, including individualized plan of care and coordination with other care providers 2. 24/7 contact phone numbers for assistance for urgent and routine care needs. 3. Service will only be billed when office clinical staff spend 20 minutes or more in a month to coordinate care. 4. Only one practitioner may furnish and bill the service in a calendar month. 5. The patient may stop CCM services at any time (effective at the end of the month) by phone call to the office staff. 6. The patient will be responsible for cost sharing (co-pay) of up to 20% of the service fee (after annual deductible is met).  Patient agreed to services and verbal consent obtained. Follow up plan: Telephone appointment with care management team member scheduled for:05/28/2021  Gleason Ardoin  Care Guide, Embedded Care Coordination [place]  Care Management  Direct Dial: [phone]

## 2021-05-23 ENCOUNTER — Other Ambulatory Visit: Payer: Self-pay | Admitting: Internal Medicine

## 2021-05-23 DIAGNOSIS — M5416 Radiculopathy, lumbar region: Secondary | ICD-10-CM

## 2021-05-23 DIAGNOSIS — M961 Postlaminectomy syndrome, not elsewhere classified: Secondary | ICD-10-CM

## 2021-05-23 DIAGNOSIS — E0849 Diabetes mellitus due to underlying condition with other diabetic neurological complication: Secondary | ICD-10-CM

## 2021-05-23 DIAGNOSIS — E118 Type 2 diabetes mellitus with unspecified complications: Secondary | ICD-10-CM

## 2021-05-23 DIAGNOSIS — I5042 Chronic combined systolic (congestive) and diastolic (congestive) heart failure: Secondary | ICD-10-CM

## 2021-05-23 MED ORDER — SOLIQUA 100-33 UNT-MCG/ML ~~LOC~~ SOPN: 60.0000 [IU] | PEN_INJECTOR | Freq: Every day | SUBCUTANEOUS | 1 refills | Status: DC

## 2021-05-23 NOTE — Addendum Note (Signed)
Addended by: Charlton Haws on: 05/23/2021 09:26 AM   Modules accepted: Orders

## 2021-05-28 ENCOUNTER — Telehealth: Payer: PPO

## 2021-05-29 ENCOUNTER — Ambulatory Visit (INDEPENDENT_AMBULATORY_CARE_PROVIDER_SITE_OTHER): Payer: PPO | Admitting: *Deleted

## 2021-05-29 DIAGNOSIS — E11311 Type 2 diabetes mellitus with unspecified diabetic retinopathy with macular edema: Secondary | ICD-10-CM | POA: Diagnosis not present

## 2021-05-29 DIAGNOSIS — E118 Type 2 diabetes mellitus with unspecified complications: Secondary | ICD-10-CM

## 2021-05-29 DIAGNOSIS — R4181 Age-related cognitive decline: Secondary | ICD-10-CM

## 2021-05-29 DIAGNOSIS — H543 Unqualified visual loss, both eyes: Secondary | ICD-10-CM

## 2021-05-30 NOTE — Patient Instructions (Signed)
Visit Information  PATIENT GOALS:  Goals Addressed             This Visit's Progress    Find Help in My Community       Timeframe:  Long-Range Goal Priority:  High Start Date:   05/29/2021                          Expected End Date:   08/21/2021                    Follow Up Date 06/17/2021    - begin a notebook of services in my neighborhood or community - call 211 when I need some help - follow-up on any referrals for help I am given - think ahead to make sure my need does not become an emergency - make a note about what I need to have by the phone or take with me, like an identification card or social security number have a back-up plan - have a back-up plan - make a list of family or friends that I can call    Why is this important?   Knowing how and where to find help for yourself or family in your neighborhood and community is an important skill.  You will want to take some steps to learn how.    Notes:          The patient verbalized understanding of instructions, educational materials, and care plan provided today and declined offer to receive copy of patient instructions, educational materials, and care plan.   Telephone follow up appointment with care management team member scheduled for:06/17/21  Eduard Clos MSW, LCSW Licensed Clinical Social Worker [place] [phone]

## 2021-05-30 NOTE — Chronic Care Management (AMB) (Signed)
Chronic Care Management    Clinical Social Work Note  05/30/2021 Name: [name] MRN: [id number] DOB: [birth year]  [name] is a 70 y.o. year old male who is a primary care patient of [name], MD. The CCM team was consulted to assist the patient with chronic disease management and/or care coordination needs related to: Transportation Needs , [employer] , Level of Care Concerns, and Caregiver Stress.   Engaged with patient by telephone for initial visit in response to provider referral for social work chronic care management and care coordination services.   Consent to Services:  The patient was given information about Chronic Care Management services, agreed to services, and gave verbal consent prior to initiation of services.  Please see initial visit note for detailed documentation.   Patient agreed to services and consent obtained.   Assessment: Review of patient past medical history, allergies, medications, and health status, including review of relevant consultants reports was performed today as part of a comprehensive evaluation and provision of chronic care management and care coordination services.     SDOH (Social Determinants of Health) assessments and interventions performed:    Advanced Directives Status: Not addressed in this encounter.  CCM Care Plan  Allergies  Allergen Reactions   Furosemide     pancreatitis   Olmesartan Other (See Comments)    hyperkalemia   Amlodipine Swelling    LE EDEMA   Diamox [Acetazolamide] Itching    hives   Lisinopril Rash    Outpatient Encounter Medications as of 05/29/2021  Medication Sig   empagliflozin (JARDIANCE) 25 MG TABS tablet Take 1 tablet (25 mg total) by mouth daily.   aspirin 81 MG EC tablet Take 1 tablet (81 mg total) by mouth daily. Swallow whole.   atorvastatin (LIPITOR) 80 MG tablet TAKE 1 TABLET BY MOUTH DAILY AT 6 PM.   benzonatate (TESSALON) 100 MG capsule Take 1 capsule (100 mg total) by  mouth 2 (two) times daily as needed for cough.   Blood Glucose Monitoring Suppl (ACCU-CHEK GUIDE ME) w/Device KIT 1 Act by Does not apply route 3 (three) times daily with meals.   carvedilol (COREG) 12.5 MG tablet TAKE 1 TABLET BY MOUTH TWICE A DAY WITH A MEAL   Cholecalciferol (VITAMIN D3) 25 MCG (1000 UT) CAPS Take by mouth.   Continuous Blood Gluc Receiver (FREESTYLE LIBRE 14 DAY READER) DEVI APPLY AS DIRECTED   Continuous Blood Gluc Sensor (FREESTYLE LIBRE 14 DAY SENSOR) MISC USE 1 SENSOR EVERY 14 DAYS, THEN REPLACE WITH NEW ONE.   fluticasone (FLONASE) 50 MCG/ACT nasal spray SPRAY 2 SPRAYS INTO EACH NOSTRIL EVERY DAY   gabapentin (NEURONTIN) 300 MG capsule TAKE 1 CAPSULE BY MOUTH THREE TIMES A DAY   glucose blood (ONETOUCH VERIO) test strip Use BID to check BS. DX E11.9   indapamide (LOZOL) 1.25 MG tablet Take 1.25 mg by mouth daily.   Insulin Glargine-Lixisenatide (SOLIQUA) 100-33 UNT-MCG/ML SOPN Inject 60 Units into the skin daily.   Insulin Pen Needle (PEN NEEDLES) 32G X 4 MM MISC Use as directed to inject insulin daily. DX: E11.9   isosorbide mononitrate (IMDUR) 120 MG 24 hr tablet TAKE 1 TABLET BY MOUTH EVERY DAY   levocetirizine (XYZAL) 5 MG tablet TAKE 1 TABLET BY MOUTH EVERY DAY IN THE EVENING   metFORMIN (GLUCOPHAGE) 500 MG tablet TAKE 1 TABLET (500 MG TOTAL) BY MOUTH 2 (TWO) TIMES DAILY WITH A MEAL.   NEXLIZET 180-10 MG TABS TAKE 1 TABLET BY MOUTH  DAILY   omega-3 acid ethyl esters (LOVAZA) 1 g capsule TAKE 2 CAPSULES BY MOUTH 2 TIMES DAILY   pantoprazole (PROTONIX) 40 MG tablet TAKE 1 TABLET BY MOUTH TWICE A DAY   ranolazine (RANEXA) 500 MG 12 hr tablet TAKE 1 TABLET BY MOUTH TWICE A DAY   SUPREP BOWEL PREP KIT 17.5-3.13-1.6 GM/177ML SOLN See admin instructions.   tamsulosin (FLOMAX) 0.4 MG CAPS capsule Take 0.4 mg by mouth once.   traMADol (ULTRAM) 50 MG tablet TAKE 1 TABLET BY MOUTH EVERY 8 HOURS AS NEEDED.   No facility-administered encounter medications on file as of  05/29/2021.    Patient Active Problem List   Diagnosis Date Noted   Age-related cognitive decline 03/07/2021   Chronic renal disease, stage 3, moderately decreased glomerular filtration rate (GFR) between 30-59 mL/min/1.73 square meter (HCC) 08/07/2019   Chronic bacterial conjunctivitis of right eye 05/26/2019   Primary open angle glaucoma (POAG) of both eyes, severe stage 03/17/2019   Polyp of colon 02/24/2019   Anemia, chronic disease 07/22/2017   Seasonal allergic rhinitis due to pollen 07/22/2017   Chronic diastolic heart failure ([place]) 11/18/2016   Type 2 diabetes mellitus with complication ([place])    Spinal stenosis, lumbar region, with neurogenic claudication 10/26/2015    Class: Chronic   Neurogenic claudication due to lumbar spinal stenosis 10/26/2015   Sleep apnea 10/24/2015   Lumbar radiculopathy 03/21/2015   Diabetic neuropathy ([place]) 02/13/2014   Postlaminectomy syndrome, lumbar region 02/13/2014   Claudication in peripheral vascular disease ([place]) 12/28/2013   POAG (primary open-angle glaucoma) 12/19/2013   GERD (gastroesophageal reflux disease) 08/25/2013   Pure hyperglyceridemia 08/25/2013   Advanced stage glaucoma 01/05/2013   HTN (hypertension) 01/05/2013   Diabetic macular edema of right eye ([place]) 11/25/2012   Neurogenic bladder 10/01/2012   Status post corneal transplant 07/15/2012   Dyslipidemia, goal LDL below 70 04/02/2012   Routine general medical examination at a health care facility 04/02/2012   B12 deficiency anemia 03/31/2012   BPH (benign prostatic hyperplasia) 03/31/2012   Obesity 05/26/2011   ED (erectile dysfunction) 05/26/2011   Coronary atherosclerosis 08/28/2010   Congestive heart failure ([place]) 08/28/2010    Conditions to be addressed/monitored: CHF, DMII, and blindness ; Transportation, Level of care concerns, and Limited access to caregiver  Care Plan : LCSW Plan of Care  Updates made by Deirdre Peer, LCSW since 05/30/2021 12:00 AM      Problem: Lack of adequate support/care for pt at home and for transporting to MD appointments   Priority: High     Problem: Functional Decline      Long-Range Goal: Develop plan for management of decline of functional and safety abilities   Start Date: 05/29/2021  Expected End Date: 08/21/2021  This Visit's Progress: On track  Priority: High  Note:   - anticipate phone call from Care Guide to assist with transportation support/resources, in-home support/services -begin a notebook of services in my neighborhood or community - call 211 when I need some help - follow-up on any referrals for help I am given - think ahead to make sure my need does not become an emergency - make a note about what I need to have by the phone or take with me, like an identification card or social security number have a back-up plan - have a back-up plan - make a list of family or friends that I can call         Follow Up Plan: [employer] will  reach out to patient for assistance with transportation needs, in-home care services, Appointment scheduled for SW follow up with client by phone on: 06/17/21 , and Client will have wife call DSS Medicaid worker to inquire about Medicaid status (type) and will review PACE program material being mailed      Eduard Clos MSW, LCSW Licensed Clinical Social Worker [place]   Patient Active Problem List   Diagnosis Date Noted   Age-related cognitive decline 03/07/2021   Chronic renal disease, stage 3, moderately decreased glomerular filtration rate (GFR) between 30-59 mL/min/1.73 square meter ([place]) 08/07/2019   Chronic bacterial conjunctivitis of right eye 05/26/2019   Primary open angle glaucoma (POAG) of both eyes, severe stage 03/17/2019   Polyp of colon 02/24/2019   Anemia, chronic disease 07/22/2017   Seasonal allergic rhinitis due to pollen 07/22/2017   Chronic diastolic heart failure ([place]) 11/18/2016   Type 2 diabetes mellitus  with complication ([place])    Spinal stenosis, lumbar region, with neurogenic claudication 10/26/2015    Class: Chronic   Neurogenic claudication due to lumbar spinal stenosis 10/26/2015   Sleep apnea 10/24/2015   Lumbar radiculopathy 03/21/2015   Diabetic neuropathy ([place]) 02/13/2014   Postlaminectomy syndrome, lumbar region 02/13/2014   Claudication in peripheral vascular disease ([place]) 12/28/2013   POAG (primary open-angle glaucoma) 12/19/2013   GERD (gastroesophageal reflux disease) 08/25/2013   Pure hyperglyceridemia 08/25/2013   Advanced stage glaucoma 01/05/2013   HTN (hypertension) 01/05/2013   Diabetic macular edema of right eye ([place]) 11/25/2012   Neurogenic bladder 10/01/2012   Status post corneal transplant 07/15/2012   Dyslipidemia, goal LDL below 70 04/02/2012   Routine general medical examination at a health care facility 04/02/2012   B12 deficiency anemia 03/31/2012   BPH (benign prostatic hyperplasia) 03/31/2012   Obesity 05/26/2011   ED (erectile dysfunction) 05/26/2011   Coronary atherosclerosis 08/28/2010   Congestive heart failure ([place]) 08/28/2010    Conditions to be addressed/monitored: DMII and blindness ; Transportation, Level of care concerns, and blindness/caregiver stress  Care Plan : LCSW Plan of Care  Updates made by Deirdre Peer, LCSW since 05/30/2021 12:00 AM     Problem: Lack of adequate support/care for pt at home and for transporting to MD appointments   Priority: High     Problem: Functional Decline      Long-Range Goal: Develop plan for management of decline of functional and safety abilities   Start Date: 05/29/2021  Expected End Date: 08/21/2021  This Visit's Progress: On track  Priority: High  Note:   Current barriers:   Patient in need of assistance with connecting to community resources for Transportation, Level of care concerns, and Lacks knowledge of community resource: PCS, PACE, etc Acknowledges deficits with meeting this unmet  need Patient is unable to independently navigate community resource options without care coordination support Clinical Goals:  patient will work with SW to address concerns related to community support services patient will work with Care Guide to address needs related to transportation, in-home/caregiver support Clinical Interventions:  CSW spoke with pt and his wife by phone. Pt is blind and wife is primary caregiver in home- she also works fulltime (from home) but is finding it more difficult to manage pt. CSW assessed and discussed programs, services and options with them. Pt has partial Medicaid (wife to call DSS to confirm) and is therefore not eligible for Full Medicaid programs- CSW discussed private duty caregiver support (paying out of pocket) and will research other programs but limited  options available.  CSW discussed the PACE program, Adult Day Care centers and facility placement (wife was asking about nursing home- he would not likely meet SNF level but maybe ALF)and will mail wife an FL2 per her request.  Wife also asking about getting transportation assistance- will place Care Guide referral for assistance in looking at what he is eligible for.    Collaboration with [name], MD regarding development and update of comprehensive plan of care as evidenced by provider attestation and co-signature Inter-disciplinary care team collaboration (see longitudinal plan of care) Assessment of needs, barriers , agencies contacted, as well as how impacting  Review various resources, discussed options and provided patient information about Transportation and Limited access to caregiver Collaborated with appropriate clinical care team members regarding patient needs Transportation, Limited access to caregiver, and blindness , CHF, DMII, and blindness  Patient interviewed and appropriate assessments performed Referred patient to community resources care guide team for assistance with  transportation, caregiver support Provided patient with information about PACE, PCS, ALF Discussed plans with patient for ongoing care management follow up and provided patient with direct contact information for care management team Assisted patient/caregiver with obtaining information about health plan benefits Provided education to patient/caregiver regarding level of care options. Other interventions provided: Solution-Focused Strategies, Active listening / Reflection utilized , Problem Solving /Task Center , Motivational Interviewing, Brief CBT , and Caregiver stress acknowledged  Patient Goals:  -   - begin a notebook of services in my neighborhood or community - call 211 when I need some help - follow-up on any referrals for help I am given - think ahead to make sure my need does not become an emergency - make a note about what I need to have by the phone or take with me, like an identification card or social security number have a back-up plan - have a back-up plan - make a list of family or friends that I can call  Follow Up Plan: Appointment scheduled for SW follow up with client by phone on: 06/17/2021       Follow Up Plan: Appointment scheduled for SW follow up with client by phone on: 06/17/2021      Eduard Clos MSW, [place] Licensed Clinical Social Worker [place] [phone]

## 2021-05-31 ENCOUNTER — Ambulatory Visit: Payer: PPO | Admitting: Podiatry

## 2021-05-31 ENCOUNTER — Other Ambulatory Visit: Payer: Self-pay | Admitting: Internal Medicine

## 2021-05-31 DIAGNOSIS — G4733 Obstructive sleep apnea (adult) (pediatric): Secondary | ICD-10-CM | POA: Diagnosis not present

## 2021-05-31 DIAGNOSIS — M5416 Radiculopathy, lumbar region: Secondary | ICD-10-CM

## 2021-05-31 DIAGNOSIS — M961 Postlaminectomy syndrome, not elsewhere classified: Secondary | ICD-10-CM

## 2021-05-31 DIAGNOSIS — E0849 Diabetes mellitus due to underlying condition with other diabetic neurological complication: Secondary | ICD-10-CM

## 2021-06-03 ENCOUNTER — Telehealth: Payer: Self-pay

## 2021-06-03 NOTE — Telephone Encounter (Signed)
   Telephone encounter was:  Unsuccessful.  06/03/2021 Name: LORENSO QUIRINO MRN: [id number] DOB: 12-27-1950  Unsuccessful outbound call made today to assist with:  Left message for patient's wife [facility] on home and mobile voicemail to return my call regarding in-home care agency list and transportation resources.  Outreach Attempt:  1st Attempt  A HIPAA compliant voice message was left requesting a return call.  Instructed patient to call back at [phone].  Lukis Bunt, AAS Paralegal, [place] [facility]  [address], [place] [address] ??[email or web address]  ?? [phone]   [email or web address]

## 2021-06-07 ENCOUNTER — Telehealth: Payer: Self-pay

## 2021-06-07 NOTE — Telephone Encounter (Signed)
   Telephone encounter was:  Unsuccessful.  06/07/2021 Name: [name] MRN: [id number] DOB: 1951-10-02  Unsuccessful outbound call made today to assist with:  Left message for patient's wife [name] on home and mobile voicemail to return my call regarding in-home care agency list and transportation resources.  Outreach Attempt:  2nd Attempt  A HIPAA compliant voice message was left requesting a return call.  Instructed patient to call back at [phone].  Leotha Westermeyer, AAS Paralegal, [place] [facility]  [address], [place] [address] ??[email or web address]  ?? [phone]   [email or web address]

## 2021-06-10 NOTE — Telephone Encounter (Signed)
[name],  After 3 unsuccessful attempts we are required to close the referral and we have to close them within 2 weeks.  Friday will be two weeks that this referral has been open.  I will attempt to call again this week and I can email the  information to the email in the chart. [name]

## 2021-06-13 ENCOUNTER — Telehealth: Payer: Self-pay

## 2021-06-13 NOTE — Telephone Encounter (Signed)
   Telephone encounter was:  Unsuccessful.  06/13/2021 Name: TAELOR MONCADA MRN: [id number] DOB: [birth year]  Unsuccessful outbound call made today to assist with:   caregiver resources and transportation  Outreach Attempt:  3rd Attempt.  Referral closed unable to contact patient.  A HIPAA compliant voice message was left requesting a return call.  Instructed patient to call back at Left message for patient's wife [facility] on home and mobile voicemail to return my call regarding in-home care agency list and transportation resources. Emailed resources to [email or web address] on 06/11/21.  Anshu Wehner, AAS Paralegal, [place] [facility]  [address], [place] [address] ??[email or web address]  ?? [phone]   [email or web address]

## 2021-06-17 ENCOUNTER — Telehealth: Payer: PPO

## 2021-06-18 ENCOUNTER — Other Ambulatory Visit: Payer: Self-pay

## 2021-06-18 ENCOUNTER — Encounter: Payer: Self-pay | Admitting: Podiatry

## 2021-06-18 ENCOUNTER — Ambulatory Visit (INDEPENDENT_AMBULATORY_CARE_PROVIDER_SITE_OTHER): Payer: PPO | Admitting: Podiatry

## 2021-06-18 DIAGNOSIS — E119 Type 2 diabetes mellitus without complications: Secondary | ICD-10-CM

## 2021-06-18 DIAGNOSIS — B351 Tinea unguium: Secondary | ICD-10-CM | POA: Diagnosis not present

## 2021-06-18 DIAGNOSIS — E1142 Type 2 diabetes mellitus with diabetic polyneuropathy: Secondary | ICD-10-CM

## 2021-06-18 DIAGNOSIS — M79609 Pain in unspecified limb: Secondary | ICD-10-CM | POA: Diagnosis not present

## 2021-06-18 NOTE — Progress Notes (Signed)
ANNUAL DIABETIC FOOT EXAM  Subjective: [name] presents today for for annual diabetic foot examination and painful thick toenails that are difficult to trim. Pain interferes with ambulation. Aggravating factors include wearing enclosed shoe gear. Pain is relieved with periodic professional debridement.  His wife is present during today's visit.  He has h/o diabetic neuropathy and takes gabapentin.  Patient's blood sugar was 140 mg/dl yesterday morning.   Patient did not check blood glucose this morning.  He would like to purchase some Foot Miracle Cream on today.  Elijah Lima, MD is patient's PCP. Last visit was 11/08/2020. He has an appointment with Dr. Ronnald [name] on tomorrow.  Past Medical History:  Diagnosis Date   Arthritis    "mild arthritis in hip"   CAD (coronary artery disease)    CHF (congestive heart failure) (HCC)    Complication of anesthesia    "lungs filled up with fluid"    Diabetes mellitus    Type II   Glaucoma    Heart murmur    HTN (hypertension)    Hypercholesterolemia    LBP (low back pain)    Legally blind    Peripheral neuropathy    Sleep apnea    c-pap nightly   Staph infection    "from back surgery"   Status post insertion of drug eluting coronary artery stent    Trigger finger    Ulcer    Wears glasses    "to protect cornea" - legally blind   Patient Active Problem List   Diagnosis Date Noted   Age-related cognitive decline 03/07/2021   Chronic renal disease, stage 3, moderately decreased glomerular filtration rate (GFR) between 30-59 mL/min/1.73 square meter ([place]) 08/07/2019   Chronic bacterial conjunctivitis of right eye 05/26/2019   Primary open angle glaucoma (POAG) of both eyes, severe stage 03/17/2019   Polyp of colon 02/24/2019   Anemia, chronic disease 07/22/2017   Seasonal allergic rhinitis due to pollen 07/22/2017   Chronic diastolic heart failure ([place]) 11/18/2016   Type 2 diabetes mellitus with complication ([place])     Spinal stenosis, lumbar region, with neurogenic claudication 10/26/2015    Class: Chronic   Neurogenic claudication due to lumbar spinal stenosis 10/26/2015   Sleep apnea 10/24/2015   Lumbar radiculopathy 03/21/2015   Diabetic neuropathy ([place]) 02/13/2014   Postlaminectomy syndrome, lumbar region 02/13/2014   Claudication in peripheral vascular disease ([place]) 12/28/2013   POAG (primary open-angle glaucoma) 12/19/2013   GERD (gastroesophageal reflux disease) 08/25/2013   Pure hyperglyceridemia 08/25/2013   Advanced stage glaucoma 01/05/2013   HTN (hypertension) 01/05/2013   Diabetic macular edema of right eye ([place]) 11/25/2012   Neurogenic bladder 10/01/2012   Status post corneal transplant 07/15/2012   Dyslipidemia, goal LDL below 70 04/02/2012   Routine general medical examination at a health care facility 04/02/2012   B12 deficiency anemia 03/31/2012   BPH (benign prostatic hyperplasia) 03/31/2012   Obesity 05/26/2011   ED (erectile dysfunction) 05/26/2011   Coronary atherosclerosis 08/28/2010   Congestive heart failure ([place]) 08/28/2010   Past Surgical History:  Procedure Laterality Date   CHOLECYSTECTOMY  09/15/2012   Procedure: LAPAROSCOPIC CHOLECYSTECTOMY WITH INTRAOPERATIVE CHOLANGIOGRAM;  Surgeon: Pedro Earls, MD;  Location: [facility];  Service: General;  Laterality: N/A;   COLONOSCOPY     EYE SURGERY Bilateral    cataracts   INGUINAL HERNIA REPAIR     right   LUMBAR FUSION     LUMBAR LAMINECTOMY/DECOMPRESSION MICRODISCECTOMY N/A 10/26/2015   Procedure: RIGHT AND CENTRAL  LUMBAR LAMINECTOMY L3-4, RIGHT L5-S1 LATERAL RECESS DECOMPRESSION;  Surgeon: Jessy Oto, MD;  Location: [facility];  Service: Orthopedics;  Laterality: N/A;   NECK SURGERY     peptic ulcer dz surgery  pt was in his 59s   bleeding ulcer.    Prosthetic Cornea placement, Right eye  2007   [facility]   TRIGGER FINGER RELEASE Right 10/26/2015   Procedure: RELEASE TRIGGER FINGER RIGHT THUMB;  Surgeon: Jessy Oto, MD;  Location: [facility];  Service: Orthopedics;  Laterality: Right;   Current Outpatient Medications on File Prior to Visit  Medication Sig Dispense Refill   empagliflozin (JARDIANCE) 25 MG TABS tablet Take 1 tablet (25 mg total) by mouth daily. 90 tablet 1   aspirin 81 MG EC tablet Take 1 tablet (81 mg total) by mouth daily. Swallow whole. 90 tablet 1   atorvastatin (LIPITOR) 80 MG tablet TAKE 1 TABLET BY MOUTH DAILY AT 6 PM. 90 tablet 1   benzonatate (TESSALON) 100 MG capsule Take 1 capsule (100 mg total) by mouth 2 (two) times daily as needed for cough. 20 capsule 0   Blood Glucose Monitoring Suppl (ACCU-CHEK GUIDE ME) w/Device KIT 1 Act by Does not apply route 3 (three) times daily with meals. 2 kit 0   carvedilol (COREG) 12.5 MG tablet TAKE 1 TABLET BY MOUTH TWICE A DAY WITH A MEAL 180 tablet 3   Cholecalciferol (VITAMIN D3) 25 MCG (1000 UT) CAPS Take by mouth.     Continuous Blood Gluc Receiver (FREESTYLE LIBRE 14 DAY READER) DEVI APPLY AS DIRECTED 2 each 5   Continuous Blood Gluc Sensor (FREESTYLE LIBRE 14 DAY SENSOR) MISC USE 1 SENSOR EVERY 14 DAYS, THEN REPLACE WITH NEW ONE. 2 each 5   fluticasone (FLONASE) 50 MCG/ACT nasal spray SPRAY 2 SPRAYS INTO EACH NOSTRIL EVERY DAY 48 mL 3   gabapentin (NEURONTIN) 300 MG capsule TAKE 1 CAPSULE BY MOUTH THREE TIMES A DAY 270 capsule 2   glucose blood (ONETOUCH VERIO) test strip Use BID to check BS. DX E11.9 100 each 12   indapamide (LOZOL) 1.25 MG tablet Take 1.25 mg by mouth daily.     Insulin Glargine-Lixisenatide (SOLIQUA) 100-33 UNT-MCG/ML SOPN Inject 60 Units into the skin daily. 15 mL 1   Insulin Pen Needle (PEN NEEDLES) 32G X 4 MM MISC Use as directed to inject insulin daily. DX: E11.9 100 each 3   isosorbide mononitrate (IMDUR) 120 MG 24 hr tablet TAKE 1 TABLET BY MOUTH EVERY DAY 90 tablet 3   levocetirizine (XYZAL) 5 MG tablet TAKE 1 TABLET BY MOUTH EVERY DAY IN THE EVENING 90 tablet 3   metFORMIN (GLUCOPHAGE) 500 MG tablet TAKE 1  TABLET (500 MG TOTAL) BY MOUTH 2 (TWO) TIMES DAILY WITH A MEAL. 180 tablet 1   NEXLIZET 180-10 MG TABS TAKE 1 TABLET BY MOUTH DAILY 90 tablet 1   nitroGLYCERIN (NITROSTAT) 0.4 MG SL tablet SMARTSIG:1 Tablet(s) Sublingual PRN     omega-3 acid ethyl esters (LOVAZA) 1 g capsule TAKE 2 CAPSULES BY MOUTH 2 TIMES DAILY 360 capsule 1   pantoprazole (PROTONIX) 40 MG tablet TAKE 1 TABLET BY MOUTH TWICE A DAY 180 tablet 3   PFIZER-BIONTECH COVID-19 VACC 30 MCG/0.3ML injection      ranolazine (RANEXA) 500 MG 12 hr tablet TAKE 1 TABLET BY MOUTH TWICE A DAY 180 tablet 1   SUPREP BOWEL PREP KIT 17.5-3.13-1.6 GM/177ML SOLN See admin instructions.     tamsulosin (FLOMAX) 0.4 MG CAPS capsule  Take 0.4 mg by mouth once.     traMADol (ULTRAM) 50 MG tablet TAKE 1 TABLET BY MOUTH EVERY 8 HOURS AS NEEDED 270 tablet 0   No current facility-administered medications on file prior to visit.    Allergies  Allergen Reactions   Furosemide     pancreatitis   Olmesartan Other (See Comments)    hyperkalemia   Amlodipine Swelling    LE EDEMA   Diamox [Acetazolamide] Itching    hives   Lisinopril Rash   Social History   Occupational History   Occupation: disabled    Comment: blind  Tobacco Use   Smoking status: Never   Smokeless tobacco: Never  Vaping Use   Vaping Use: Never used  Substance and Sexual Activity   Alcohol use: No   Drug use: No   Sexual activity: Not Currently   Family History  Problem Relation Age of Onset   Breast cancer Mother    Colon cancer Mother    Hypertension Mother    Diabetes Mother    Hypertension Father    Colon cancer Other        Elevated Risk for   Diabetes Sister    Diabetes Brother    Esophageal cancer Neg Hx    Stomach cancer Neg Hx    Rectal cancer Neg Hx    Immunization History  Administered Date(s) Administered   Fluad Quad(high Dose 65+) 02/08/2020, 11/08/2020   Influenza Split 09/12/2012   Influenza Whole 10/28/2010, 10/28/2011   Influenza, High Dose  Seasonal PF 08/25/2013, 08/21/2015, 12/09/2016, 02/24/2019   Influenza, Quadrivalent, Recombinant, Inj, Pf 11/04/2017   Influenza,inj,Quad PF,6+ Mos 10/25/2014   PFIZER(Purple Top)SARS-COV-2 Vaccination 04/06/2020, 04/27/2020, 01/14/2021   Pneumococcal Conjugate-13 10/25/2014   Pneumococcal Polysaccharide-23 10/28/2010, 09/13/2012, 02/08/2020   Pneumococcal-Unspecified 07/22/2014   Td 10/22/2010   Tdap 05/08/2020     Review of Systems: Negative except as noted in the HPI.  Objective: There were no vitals filed for this visit.  [name] is a pleasant 70 y.o. male in NAD. AAO X 3.  Vascular Examination: Capillary fill time to digits <3 seconds b/l lower extremities. Faintly palpable DP pulse(s) b/l lower extremities. Nonpalpable PT pulse(s) b/l lower extremities. Pedal hair absent. Lower extremity skin temperature gradient within normal limits. No pain with calf compression b/l. No edema noted b/l lower extremities.  Dermatological Examination: Pedal skin is thin shiny, atrophic b/l lower extremities. No open wounds b/l lower extremities No interdigital macerations b/l lower extremities Toenails 1-5 b/l elongated, discolored, dystrophic, thickened, crumbly with subungual debris and tenderness to dorsal palpation.  Musculoskeletal Examination: Muscle strength 3/5 to all LE muscle groups of left lower extremity. Muscle strength 2/5 to all LE muscle groups of right lower extremity. No pain crepitus or joint limitation noted with ROM b/l. No gross bony deformities bilaterally. Wearing appropriate fitting shoe gear. Utilizes cane for ambulation assistance.  Footwear Assessment: Does the patient wear appropriate shoes? Yes. Does the patient need inserts/orthotics? No.  Neurological Examination: Pt has subjective symptoms of neuropathy. Protective sensation diminished with 10g monofilament b/l. Vibratory sensation diminished b/l.  Hemoglobin A1C Latest Ref Rng & Units 11/08/2020  HGBA1C  4.0 - 5.6 % 8.0(A)  Some recent data might be hidden   Assessment: 1. Pain due to onychomycosis of nail   2. Diabetic peripheral neuropathy associated with type 2 diabetes mellitus ([place])   3. Encounter for diabetic foot exam ([place])     ADA Risk Categorization: High Risk  Patient has one or  more of the following: Loss of protective sensation Absent pedal pulses Severe Foot deformity History of foot ulcer  Plan: -Examined patient. -Diabetic foot examination performed today. -Patient to continue soft, supportive shoe gear daily. -Toenails 1-5 b/l were debrided in length and girth with sterile nail nippers and dremel without iatrogenic bleeding.  -Patient to report any pedal injuries to medical professional immediately. -Patient/POA to call should there be question/concern in the interim.  Return in about 3 months (around 09/18/2021).  Marzetta Board, DPM

## 2021-06-19 ENCOUNTER — Ambulatory Visit (INDEPENDENT_AMBULATORY_CARE_PROVIDER_SITE_OTHER): Payer: PPO | Admitting: Internal Medicine

## 2021-06-19 ENCOUNTER — Encounter: Payer: Self-pay | Admitting: Internal Medicine

## 2021-06-19 ENCOUNTER — Telehealth: Payer: Self-pay | Admitting: *Deleted

## 2021-06-19 VITALS — BP 138/76 | HR 65 | Temp 98.0°F | Ht 70.0 in | Wt 218.0 lb

## 2021-06-19 DIAGNOSIS — D510 Vitamin B12 deficiency anemia due to intrinsic factor deficiency: Secondary | ICD-10-CM

## 2021-06-19 DIAGNOSIS — D638 Anemia in other chronic diseases classified elsewhere: Secondary | ICD-10-CM | POA: Diagnosis not present

## 2021-06-19 DIAGNOSIS — E119 Type 2 diabetes mellitus without complications: Secondary | ICD-10-CM

## 2021-06-19 DIAGNOSIS — Z0000 Encounter for general adult medical examination without abnormal findings: Secondary | ICD-10-CM

## 2021-06-19 DIAGNOSIS — E781 Pure hyperglyceridemia: Secondary | ICD-10-CM

## 2021-06-19 DIAGNOSIS — N1831 Chronic kidney disease, stage 3a: Secondary | ICD-10-CM

## 2021-06-19 DIAGNOSIS — Z794 Long term (current) use of insulin: Secondary | ICD-10-CM

## 2021-06-19 DIAGNOSIS — Z23 Encounter for immunization: Secondary | ICD-10-CM

## 2021-06-19 DIAGNOSIS — I10 Essential (primary) hypertension: Secondary | ICD-10-CM | POA: Diagnosis not present

## 2021-06-19 DIAGNOSIS — E118 Type 2 diabetes mellitus with unspecified complications: Secondary | ICD-10-CM

## 2021-06-19 DIAGNOSIS — E785 Hyperlipidemia, unspecified: Secondary | ICD-10-CM | POA: Diagnosis not present

## 2021-06-19 LAB — CBC WITH DIFFERENTIAL/PLATELET
Basophils Absolute: 0 10*3/uL (ref 0.0–0.1)
Basophils Relative: 0.6 % (ref 0.0–3.0)
Eosinophils Absolute: 0.2 10*3/uL (ref 0.0–0.7)
Eosinophils Relative: 4.9 % (ref 0.0–5.0)
HCT: 37.7 % — ABNORMAL LOW (ref 39.0–52.0)
Hemoglobin: 12.1 g/dL — ABNORMAL LOW (ref 13.0–17.0)
Lymphocytes Relative: 46.5 % — ABNORMAL HIGH (ref 12.0–46.0)
Lymphs Abs: 2.3 10*3/uL (ref 0.7–4.0)
MCHC: 32.2 g/dL (ref 30.0–36.0)
MCV: 70.2 fl — ABNORMAL LOW (ref 78.0–100.0)
Monocytes Absolute: 0.5 10*3/uL (ref 0.1–1.0)
Monocytes Relative: 9.9 % (ref 3.0–12.0)
Neutro Abs: 1.9 10*3/uL (ref 1.4–7.7)
Neutrophils Relative %: 38.1 % — ABNORMAL LOW (ref 43.0–77.0)
Platelets: 239 10*3/uL (ref 150.0–400.0)
RBC: 5.36 Mil/uL (ref 4.22–5.81)
RDW: 15.2 % (ref 11.5–15.5)
WBC: 5.1 10*3/uL (ref 4.0–10.5)

## 2021-06-19 LAB — URINALYSIS, ROUTINE W REFLEX MICROSCOPIC
Bilirubin Urine: NEGATIVE
Hgb urine dipstick: NEGATIVE
Ketones, ur: NEGATIVE
Leukocytes,Ua: NEGATIVE
Nitrite: NEGATIVE
RBC / HPF: NONE SEEN (ref 0–?)
Specific Gravity, Urine: 1.02 (ref 1.000–1.030)
Total Protein, Urine: NEGATIVE
Urine Glucose: NEGATIVE
Urobilinogen, UA: 0.2 (ref 0.0–1.0)
pH: 6 (ref 5.0–8.0)

## 2021-06-19 LAB — LIPID PANEL
Cholesterol: 101 mg/dL (ref 0–200)
HDL: 28.9 mg/dL — ABNORMAL LOW (ref >39.00)
LDL Cholesterol: 60 mg/dL (ref 0–99)
NonHDL: 71.76
Total CHOL/HDL Ratio: 3
Triglycerides: 61 mg/dL (ref 0.0–149.0)
VLDL: 12.2 mg/dL (ref 0.0–40.0)

## 2021-06-19 LAB — HEPATIC FUNCTION PANEL
ALT: 15 U/L (ref 0–53)
AST: 18 U/L (ref 0–37)
Albumin: 4.1 g/dL (ref 3.5–5.2)
Alkaline Phosphatase: 40 U/L (ref 39–117)
Bilirubin, Direct: 0.1 mg/dL (ref 0.0–0.3)
Total Bilirubin: 0.5 mg/dL (ref 0.2–1.2)
Total Protein: 7.2 g/dL (ref 6.0–8.3)

## 2021-06-19 LAB — BASIC METABOLIC PANEL
BUN: 17 mg/dL (ref 6–23)
CO2: 31 mEq/L (ref 19–32)
Calcium: 8.9 mg/dL (ref 8.4–10.5)
Chloride: 101 mEq/L (ref 96–112)
Creatinine, Ser: 1.18 mg/dL (ref 0.40–1.50)
GFR: 61.94 mL/min (ref >60.00)
Glucose, Bld: 87 mg/dL (ref 70–99)
Potassium: 4.1 mEq/L (ref 3.5–5.1)
Sodium: 139 mEq/L (ref 135–145)

## 2021-06-19 LAB — HEMOGLOBIN A1C: Hgb A1c MFr Bld: 7.9 % — ABNORMAL HIGH (ref 4.6–6.5)

## 2021-06-19 LAB — MICROALBUMIN / CREATININE URINE RATIO
Creatinine,U: 116 mg/dL
Microalb Creat Ratio: 1.5 mg/g (ref 0.0–30.0)
Microalb, Ur: 1.8 mg/dL (ref 0.0–1.9)

## 2021-06-19 LAB — VITAMIN B12: Vitamin B-12: 877 pg/mL (ref 211–911)

## 2021-06-19 LAB — FOLATE: Folate: 13 ng/mL (ref >5.9)

## 2021-06-19 LAB — TSH: TSH: 1.42 u[IU]/mL (ref 0.35–4.50)

## 2021-06-19 MED ORDER — GVOKE HYPOPEN 2-PACK 1 MG/0.2ML ~~LOC~~ SOAJ: 1.0000 | Freq: Every day | SUBCUTANEOUS | 5 refills | Status: DC | PRN

## 2021-06-19 NOTE — Telephone Encounter (Signed)
  Care Management   Follow Up Note   06/19/2021 Name: [name] MRN: [id number] DOB: 1951/10/31   Referred by: [name], MD Reason for referral : No chief complaint on file.   An unsuccessful telephone outreach was attempted today. The patient was referred to the case management team for assistance with care management and care coordination.  and A second unsuccessful telephone outreach was attempted today. The patient was referred to the case management team for assistance with care management and care coordination.   Follow Up Plan: We have been unable to make contact with the patient for follow up. The care management team is available to follow up with the patient after provider conversation with the patient regarding recommendation for care management engagement and subsequent re-referral to the care management team.   Eduard Clos MSW, LCSW Licensed Clinical Social Worker [place] [phone]

## 2021-06-19 NOTE — Progress Notes (Signed)
Subjective:  Patient ID: [name], male    DOB: 06-16-1951  Age: 70 y.o. MRN: [id number]  CC: Anemia, Annual Exam, Hyperlipidemia, and Diabetes  This visit occurred during the SARS-CoV-2 public health emergency.  Safety protocols were in place, including screening questions prior to the visit, additional usage of staff PPE, and extensive cleaning of exam room while observing appropriate contact time as indicated for disinfecting solutions.    HPI [name] presents for a CPX.  His wife tells me that he has 3 episodes of hypoglycemia over the last 3 months. On one occasion EMS was called. The episodes resolved quickly with food intake. He denies polys.  Outpatient Medications Prior to Visit  Medication Sig Dispense Refill   aspirin 81 MG EC tablet Take 1 tablet (81 mg total) by mouth daily. Swallow whole. 90 tablet 1   atorvastatin (LIPITOR) 80 MG tablet TAKE 1 TABLET BY MOUTH DAILY AT 6 PM. 90 tablet 1   Blood Glucose Monitoring Suppl (ACCU-CHEK GUIDE ME) w/Device KIT 1 Act by Does not apply route 3 (three) times daily with meals. 2 kit 0   carvedilol (COREG) 12.5 MG tablet TAKE 1 TABLET BY MOUTH TWICE A DAY WITH A MEAL 180 tablet 3   Cholecalciferol (VITAMIN D3) 25 MCG (1000 UT) CAPS Take by mouth.     Continuous Blood Gluc Receiver (FREESTYLE LIBRE 14 DAY READER) DEVI APPLY AS DIRECTED 2 each 5   Continuous Blood Gluc Sensor (FREESTYLE LIBRE 14 DAY SENSOR) MISC USE 1 SENSOR EVERY 14 DAYS, THEN REPLACE WITH NEW ONE. 2 each 5   empagliflozin (JARDIANCE) 25 MG TABS tablet Take 1 tablet (25 mg total) by mouth daily. 90 tablet 1   fluticasone (FLONASE) 50 MCG/ACT nasal spray SPRAY 2 SPRAYS INTO EACH NOSTRIL EVERY DAY 48 mL 3   gabapentin (NEURONTIN) 300 MG capsule TAKE 1 CAPSULE BY MOUTH THREE TIMES A DAY 270 capsule 2   glucose blood (ONETOUCH VERIO) test strip Use BID to check BS. DX E11.9 100 each 12   indapamide (LOZOL) 1.25 MG tablet Take 1.25 mg by mouth daily.     Insulin  Glargine-Lixisenatide (SOLIQUA) 100-33 UNT-MCG/ML SOPN Inject 60 Units into the skin daily. 15 mL 1   isosorbide mononitrate (IMDUR) 120 MG 24 hr tablet TAKE 1 TABLET BY MOUTH EVERY DAY 90 tablet 3   levocetirizine (XYZAL) 5 MG tablet TAKE 1 TABLET BY MOUTH EVERY DAY IN THE EVENING 90 tablet 3   metFORMIN (GLUCOPHAGE) 500 MG tablet TAKE 1 TABLET (500 MG TOTAL) BY MOUTH 2 (TWO) TIMES DAILY WITH A MEAL. 180 tablet 1   NEXLIZET 180-10 MG TABS TAKE 1 TABLET BY MOUTH DAILY 90 tablet 1   nitroGLYCERIN (NITROSTAT) 0.4 MG SL tablet SMARTSIG:1 Tablet(s) Sublingual PRN     omega-3 acid ethyl esters (LOVAZA) 1 g capsule TAKE 2 CAPSULES BY MOUTH 2 TIMES DAILY 360 capsule 1   pantoprazole (PROTONIX) 40 MG tablet TAKE 1 TABLET BY MOUTH TWICE A DAY 180 tablet 3   PFIZER-BIONTECH COVID-19 VACC 30 MCG/0.3ML injection      ranolazine (RANEXA) 500 MG 12 hr tablet TAKE 1 TABLET BY MOUTH TWICE A DAY 180 tablet 1   tamsulosin (FLOMAX) 0.4 MG CAPS capsule Take 0.4 mg by mouth once.     traMADol (ULTRAM) 50 MG tablet TAKE 1 TABLET BY MOUTH EVERY 8 HOURS AS NEEDED 270 tablet 0   benzonatate (TESSALON) 100 MG capsule Take 1 capsule (100 mg total) by mouth 2 (  two) times daily as needed for cough. 20 capsule 0   Insulin Pen Needle (PEN NEEDLES) 32G X 4 MM MISC Use as directed to inject insulin daily. DX: E11.9 100 each 3   SUPREP BOWEL PREP KIT 17.5-3.13-1.6 GM/177ML SOLN See admin instructions.     No facility-administered medications prior to visit.    ROS Review of Systems  Constitutional:  Negative for diaphoresis, fatigue and unexpected weight change.  HENT: Negative.    Respiratory:  Negative for cough, chest tightness, shortness of breath and wheezing.   Cardiovascular:  Negative for chest pain, palpitations and leg swelling.  Gastrointestinal:  Negative for abdominal pain, diarrhea and nausea.  Endocrine: Negative.  Negative for polydipsia, polyphagia and polyuria.  Genitourinary: Negative.  Negative for  difficulty urinating.  Musculoskeletal:  Negative for back pain and myalgias.  Skin: Negative.   Hematological:  Negative for adenopathy. Does not bruise/bleed easily.   Objective:  BP 138/76 (BP Location: Right Arm, Patient Position: Sitting, Cuff Size: Large)   Pulse 65   Temp 98 F (36.7 C) (Oral)   Ht 5' 10"  (1.778 m)   Wt 218 lb (98.9 kg)   SpO2 96%   BMI 31.28 kg/m   BP Readings from Last 3 Encounters:  06/19/21 138/76  01/29/21 (!) 143/62  11/08/20 136/68    Wt Readings from Last 3 Encounters:  06/19/21 218 lb (98.9 kg)  01/29/21 221 lb (100.2 kg)  01/15/21 221 lb (100.2 kg)    Physical Exam Vitals reviewed.  HENT:     Nose: Nose normal.     Mouth/Throat:     Mouth: Mucous membranes are moist.  Eyes:     General: No scleral icterus.    Conjunctiva/sclera: Conjunctivae normal.  Cardiovascular:     Rate and Rhythm: Normal rate and regular rhythm.     Heart sounds: No murmur heard. Pulmonary:     Effort: Pulmonary effort is normal.     Breath sounds: No stridor. No wheezing, rhonchi or rales.  Abdominal:     General: Abdomen is protuberant. Bowel sounds are normal. There is no distension.     Palpations: Abdomen is soft. There is no hepatomegaly, splenomegaly or mass.     Tenderness: There is no abdominal tenderness. There is no guarding.  Musculoskeletal:        General: Normal range of motion.     Cervical back: Neck supple.     Right lower leg: No edema.     Left lower leg: No edema.  Lymphadenopathy:     Cervical: No cervical adenopathy.  Skin:    General: Skin is warm and dry.     Coloration: Skin is not pale.  Neurological:     General: No focal deficit present.     Mental Status: He is alert.  Psychiatric:        Mood and Affect: Mood normal.    Lab Results  Component Value Date   WBC 5.1 06/19/2021   HGB 12.1 (L) 06/19/2021   HCT 37.7 (L) 06/19/2021   PLT 239.0 06/19/2021   GLUCOSE 87 06/19/2021   CHOL 101 06/19/2021   TRIG 61.0  06/19/2021   HDL 28.90 (L) 06/19/2021   LDLDIRECT 82.7 04/04/2013   LDLCALC 60 06/19/2021   ALT 15 06/19/2021   AST 18 06/19/2021   NA 139 06/19/2021   K 4.1 06/19/2021   CL 101 06/19/2021   CREATININE 1.18 06/19/2021   BUN 17 06/19/2021   CO2 31 06/19/2021  TSH 1.42 06/19/2021   PSA 2.51 01/02/2021   INR 0.95 08/17/2010   HGBA1C 7.9 (H) 06/19/2021   MICROALBUR 1.8 06/19/2021    VAS [place] LOWER EXTREMITY VENOUS (DVT)  Result Date: 07/23/2018  Lower Venous DVT Study Indications: Edema, bilaterally. Denies SOB.  Risk Factors: None identified. Performing Technologist: Sharlett Iles RVT  Examination Guidelines: A complete evaluation includes B-mode imaging, spectral Doppler, color Doppler, and power Doppler as needed of all accessible portions of each vessel. Bilateral testing is considered an integral part of a complete examination. Limited examinations for reoccurring indications may be performed as noted. The reflux portion of the exam is performed with the patient in reverse Trendelenburg.  Right Venous Findings: +---------+---------------+---------+-----------+----------+-------+          CompressibilityPhasicitySpontaneityPropertiesSummary +---------+---------------+---------+-----------+----------+-------+ CFV      Full           Yes      Yes                          +---------+---------------+---------+-----------+----------+-------+ SFJ      Full           Yes      Yes                          +---------+---------------+---------+-----------+----------+-------+ FV Prox  Full           Yes      Yes                          +---------+---------------+---------+-----------+----------+-------+ FV Mid   Full                                                 +---------+---------------+---------+-----------+----------+-------+ FV DistalFull           Yes      Yes                          +---------+---------------+---------+-----------+----------+-------+ PFV       Full           Yes      Yes                          +---------+---------------+---------+-----------+----------+-------+ POP      Full           Yes      Yes                          +---------+---------------+---------+-----------+----------+-------+ PTV      Full           Yes      No                           +---------+---------------+---------+-----------+----------+-------+ PERO     Full           Yes      No                           +---------+---------------+---------+-----------+----------+-------+ Gastroc  Full                                                 +---------+---------------+---------+-----------+----------+-------+  GSV      Full           Yes      Yes                          +---------+---------------+---------+-----------+----------+-------+  Left Venous Findings: +---------+---------------+---------+-----------+----------+-------+          CompressibilityPhasicitySpontaneityPropertiesSummary +---------+---------------+---------+-----------+----------+-------+ CFV      Full           Yes      Yes                          +---------+---------------+---------+-----------+----------+-------+ SFJ      Full           Yes      Yes                          +---------+---------------+---------+-----------+----------+-------+ FV Prox  Full           Yes      Yes                          +---------+---------------+---------+-----------+----------+-------+ FV Mid   Full                                                 +---------+---------------+---------+-----------+----------+-------+ FV DistalFull           Yes      Yes                          +---------+---------------+---------+-----------+----------+-------+ PFV      Full           Yes      Yes                          +---------+---------------+---------+-----------+----------+-------+ POP      Full           Yes      Yes                           +---------+---------------+---------+-----------+----------+-------+ PTV      Full           Yes      No                           +---------+---------------+---------+-----------+----------+-------+ PERO     Full           Yes      No                           +---------+---------------+---------+-----------+----------+-------+ Gastroc  Full                                                 +---------+---------------+---------+-----------+----------+-------+ GSV      Full           Yes      Yes                          +---------+---------------+---------+-----------+----------+-------+  Final Interpretation: Right: No evidence of deep vein thrombosis in the lower extremity. No indirect evidence of obstruction proximal to the inguinal ligament. Left: No evidence of deep vein thrombosis in the lower extremity. No indirect evidence of obstruction proximal to the inguinal ligament.  *See table(s) above for measurements and observations. Electronically signed by Ida Rogue MD on 07/23/2018 at 7:37:05 PM.    Final     Assessment & Plan:   Franchot was seen today for anemia, annual exam, hyperlipidemia and diabetes.  Diagnoses and all orders for this visit:  Stage 3a chronic kidney disease ([place])- His renal function is normal now.  He is avoiding nephrotoxic agents.  Will continue to maintain control of his blood pressure and his blood sugar. 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  I have asked him to return to be screened for other vitamin deficiencies. -     Vitamin B12; Future -      Folate; Future -     CBC with Differential/Platelet; Future -     CBC with Differential/Platelet -     Folate -     Vitamin B12  Dyslipidemia, goal LDL below 70- LDL goal achieved. Doing well on the statin  -     Lipid panel; Future -     TSH; Future -     Hepatic function panel; Future -     Hepatic function panel -     TSH -     Lipid panel  Pure hyperglyceridemia- His trigs are normal. -     Lipid panel; Future -     Lipid panel  Anemia, chronic disease -     CBC with Differential/Platelet; Future -     CBC with Differential/Platelet -     Iron; Future -     Ferritin; Future -     Vitamin B1; Future -     Zinc; Future  Routine general medical examination at a health care facility- Exam completed, labs reviewed, vaccines reviewed and updated, cancer screenings are up-to-date, patient education was given.  Primary hypertension- His BP is well controlled -     TSH; Future -     TSH  Type 2 diabetes mellitus with complication ([place])- His blood sugar is adequately well controlled -     Microalbumin / creatinine urine ratio; Future -     Hemoglobin A1c; Future -     Hemoglobin A1c -     Microalbumin / creatinine urine ratio -     Glucagon (GVOKE HYPOPEN 2-PACK) 1 MG/0.2ML SOAJ; Inject 1 Act into the skin daily as needed.  Insulin-requiring or dependent type II diabetes mellitus ([place]) -     Glucagon (GVOKE HYPOPEN 2-PACK) 1 MG/0.2ML SOAJ; Inject 1 Act into the skin daily as needed.  I have discontinued Carloyn Manner B[employer]'s Pen Needles, Suprep Bowel Prep Kit, and benzonatate. I am also having him start on Gvoke HypoPen 2-Pack. Additionally, I am having him maintain his glucose blood, Accu-Chek Guide Me, Vitamin D3,  tamsulosin, fluticasone, Nexlizet, levocetirizine, carvedilol, pantoprazole, isosorbide mononitrate, FreeStyle Libre 14 Day Reader, ranolazine, indapamide, aspirin, metFORMIN, omega-3 acid ethyl esters, FreeStyle Libre 14 Day Sensor, gabapentin, empagliflozin,  atorvastatin, Soliqua, traMADol, Pfizer-BioNTech COVID-19 Vacc, and nitroGLYCERIN.  Meds ordered this encounter  Medications   Glucagon (GVOKE HYPOPEN 2-PACK) 1 MG/0.2ML SOAJ    Sig: Inject 1 Act into the skin daily as needed.    Dispense:  2 mL    Refill:  5      Follow-up: Return in about 3 months (around 09/19/2021).  [name], MD

## 2021-06-19 NOTE — Patient Instructions (Signed)
Chronic Kidney Disease, Adult Chronic kidney disease (CKD) occurs when the kidneys are slowly and permanently damaged over a long period of time. The kidneys are a pair of organs that do many important jobs in the body, including: Removing waste and extra fluid from the blood to make urine. Making hormones that maintain the amount of fluid in tissues and blood vessels. Maintaining the right amount of fluids and chemicals in the body. A small amount of kidney damage may not cause problems, but a large amount of damage may make it hard or impossible for the kidneys to work right. Steps must be taken to slow kidney damage or to stop it from getting worse. If steps are not taken, the kidneys may stop working permanently (end-stage renal disease, or ESRD). Most of the time, CKD does not go away, but it can often becontrolled. People who have CKD are usually able to live full lives. What are the causes? The most common causes of this condition are diabetes and high blood pressure (hypertension). Other causes include: Cardiovascular diseases. These affect the heart and blood vessels. Kidney diseases. These include: Glomerulonephritis, or inflammation of the tiny filters in the kidneys. Interstitial nephritis. This is swelling of the small tubes of the kidneys and of the surrounding structures. Polycystic kidney disease, in which clusters of fluid-filled sacs form within the kidneys. Renal vascular disease. This includes disorders that affect the arteries and veins of the kidneys. Diseases that affect the body's defense system (immune system). A problem with urine flow. This may be caused by: Kidney stones. Cancer. An enlarged prostate, in males. A kidney infection or urinary tract infection (UTI) that keeps coming back. Vasculitis. This is swelling or inflammation of the blood vessels. What increases the risk? Your chances of having kidney disease increase with age. The following factors may make  you more likely to develop this condition: A family history of kidney disease or kidney failure. Kidney failure means the kidneys can no longer work right. Certain genetic diseases. Taking medicines often that are damaging to the kidneys. Being around or being in contact with toxic substances. Obesity. A history of tobacco use. What are the signs or symptoms? Symptoms of this condition include: Feeling very tired (lethargic) and having less energy. Swelling, or edema, of the face, legs, ankles, or feet. Nausea or vomiting, or loss of appetite. Confusion or trouble concentrating. Muscle twitches and cramps, especially in the legs. Dry, itchy skin. A metallic taste in the mouth. Producing less urine, or producing more urine (especially at night). Shortness of breath. Trouble sleeping. CKD may also result in not having enough red blood cells or hemoglobin in the blood (anemia) or having weak bones (bone disease). Symptoms develop slowly and may not be obvious until the kidney damage becomessevere. It is possible to have kidney disease for years without having symptoms. How is this diagnosed? This condition may be diagnosed based on: Blood tests. Urine tests. Imaging tests, such as an ultrasound or a CT scan. A kidney biopsy. This involves removing a sample of kidney tissue to be looked at under a microscope. Results from these tests will help to determine how serious the CKD is. How is this treated? There is no cure for most cases of this condition, but treatment usually relieves symptoms and prevents or slows the worsening of the disease. Treatment may include: Diet changes, which may require you to avoid alcohol and foods that are high in salt, potassium, phosphorous, and protein. Medicines. These may: Lower blood   pressure. Control blood sugar (glucose). Relieve anemia. Relieve swelling. Protect your bones. Improve the balance of salts and minerals in your blood  (electrolytes). Dialysis, which is a type of treatment that removes toxic waste from the body. It may be needed if you have kidney failure. Managing any other conditions that are causing your CKD or making it worse. Follow these instructions at home: Medicines Take over-the-counter and prescription medicines only as told by your health care provider. The amount of some medicines that you take may need to be changed. Do not take any new medicines unless approved by your health care provider. Many medicines can make kidney damage worse. Do not take any vitamin and mineral supplements unless approved by your health care provider. Many nutritional supplements can make kidney damage worse. Lifestyle  Do not use any products that contain nicotine or tobacco, such as cigarettes, e-cigarettes, and chewing tobacco. If you need help quitting, ask your health care provider. If you drink alcohol: Limit how much you use to: 0-1 drink a day for women who are not pregnant. 0-2 drinks a day for men. Know how much alcohol is in your drink. In the [place] one drink equals one 12 oz bottle of beer (355 mL), one 5 oz glass of wine (148 mL), or one 1 oz glass of hard liquor (44 mL). Maintain a healthy weight. If you need help, ask your health care provider.  General instructions  Follow instructions from your health care provider about eating or drinking restrictions, including any prescribed diet. Track your blood pressure at home. Report changes in your blood pressure as told. If you are being treated for diabetes, track your blood glucose levels as told. Start or continue an exercise plan. Exercise at least 30 minutes a day, 5 days a week. Keep your immunizations up to date as told. Keep all follow-up visits. This is important.  Where to find more information American Association of Kidney Patients: www.aakp.org National Kidney Foundation: www.kidney.org American Kidney Fund: www.akfinc.org Life Options:  www.lifeoptions.org Kidney School: www.kidneyschool.org Contact a health care provider if: Your symptoms get worse. You develop new symptoms. Get help right away if: You develop symptoms of ESRD. These include: Headaches. Numbness in your hands or feet. Easy bruising. Frequent hiccups. Chest pain. Shortness of breath. Lack of menstrual periods, in women. You have a fever. You are producing less urine than usual. You have pain or bleeding when you urinate or when you have a bowel movement. These symptoms may represent a serious problem that is an emergency. Do not wait to see if the symptoms will go away. Get medical help right away. Call your local emergency services (911 in the [place] Do not drive yourself to the hospital. Summary Chronic kidney disease (CKD) occurs when the kidneys become damaged slowly over a long period of time. The most common causes of this condition are diabetes and high blood pressure (hypertension). There is no cure for most cases of CKD, but treatment usually relieves symptoms and prevents or slows the worsening of the disease. Treatment may include a combination of lifestyle changes, medicines, and dialysis. This information is not intended to replace advice given to you by your health care provider. Make sure you discuss any questions you have with your healthcare provider. Document Revised: 03/14/2020 Document Reviewed: 03/14/2020 Elsevier Patient Education  2022 [employer]  

## 2021-06-20 MED ORDER — SHINGRIX 50 MCG/0.5ML IM SUSR
0.5000 mL | Freq: Once | INTRAMUSCULAR | 1 refills | Status: AC
Start: 2021-06-20 — End: 2021-06-20

## 2021-06-22 ENCOUNTER — Other Ambulatory Visit: Payer: Self-pay | Admitting: Internal Medicine

## 2021-06-22 DIAGNOSIS — I5042 Chronic combined systolic (congestive) and diastolic (congestive) heart failure: Secondary | ICD-10-CM

## 2021-06-22 DIAGNOSIS — E118 Type 2 diabetes mellitus with unspecified complications: Secondary | ICD-10-CM

## 2021-06-22 DIAGNOSIS — J301 Allergic rhinitis due to pollen: Secondary | ICD-10-CM

## 2021-06-24 ENCOUNTER — Other Ambulatory Visit: Payer: Self-pay | Admitting: Internal Medicine

## 2021-06-24 DIAGNOSIS — E781 Pure hyperglyceridemia: Secondary | ICD-10-CM

## 2021-06-24 DIAGNOSIS — E118 Type 2 diabetes mellitus with unspecified complications: Secondary | ICD-10-CM

## 2021-06-24 DIAGNOSIS — I5042 Chronic combined systolic (congestive) and diastolic (congestive) heart failure: Secondary | ICD-10-CM

## 2021-06-24 DIAGNOSIS — J301 Allergic rhinitis due to pollen: Secondary | ICD-10-CM

## 2021-06-24 MED ORDER — EMPAGLIFLOZIN 25 MG PO TABS: 25.0000 mg | ORAL_TABLET | Freq: Every day | ORAL | 1 refills | Status: DC

## 2021-06-24 MED ORDER — FLUTICASONE PROPIONATE 50 MCG/ACT NA SUSP
2.0000 | Freq: Every day | NASAL | 3 refills | Status: DC
Start: 2021-06-24 — End: 2022-07-22

## 2021-06-24 MED ORDER — SOLIQUA 100-33 UNT-MCG/ML ~~LOC~~ SOPN: 60.0000 [IU] | PEN_INJECTOR | Freq: Every day | SUBCUTANEOUS | 1 refills | Status: DC

## 2021-06-24 MED ORDER — OMEGA-3-ACID ETHYL ESTERS 1 G PO CAPS
2.0000 g | ORAL_CAPSULE | Freq: Two times a day (BID) | ORAL | 1 refills | Status: DC
Start: 2021-06-24 — End: 2021-11-28

## 2021-06-26 ENCOUNTER — Other Ambulatory Visit: Payer: Self-pay

## 2021-06-26 DIAGNOSIS — I5022 Chronic systolic (congestive) heart failure: Secondary | ICD-10-CM

## 2021-06-26 MED ORDER — CARVEDILOL 12.5 MG PO TABS: ORAL_TABLET | ORAL | 1 refills | Status: DC

## 2021-06-28 ENCOUNTER — Ambulatory Visit (INDEPENDENT_AMBULATORY_CARE_PROVIDER_SITE_OTHER): Payer: PPO | Admitting: *Deleted

## 2021-06-28 ENCOUNTER — Telehealth: Payer: Self-pay | Admitting: Pharmacist

## 2021-06-28 DIAGNOSIS — R4181 Age-related cognitive decline: Secondary | ICD-10-CM

## 2021-06-28 DIAGNOSIS — E118 Type 2 diabetes mellitus with unspecified complications: Secondary | ICD-10-CM | POA: Diagnosis not present

## 2021-06-28 DIAGNOSIS — H5440 Blindness, one eye, unspecified eye: Secondary | ICD-10-CM

## 2021-07-01 NOTE — Progress Notes (Signed)
    Chronic Care Management Pharmacy Assistant   Name: SAFAL HALDERMAN  MRN: [id number] DOB: 23-Dec-1950  Reason for Encounter: Disease State-General Adherence   Recent office visits:  06/19/21 [name] (PCP) - Stage 3a kidney disease. Start Glucagon 1 mg. D/c Benzonatate & Suprep bowel kit. Shringrix injection.  Recent consult visits:  06/18/21 [place] (Podiatry) - Pain due to onychomycosis of nail. No med changes. F/u 3 mos.  Hospital visits:  None in previous 6 months  Medications: Outpatient Encounter Medications as of 06/28/2021  Medication Sig   aspirin 81 MG EC tablet Take 1 tablet (81 mg total) by mouth daily. Swallow whole.   atorvastatin (LIPITOR) 80 MG tablet TAKE 1 TABLET BY MOUTH DAILY AT 6 PM.   carvedilol (COREG) 12.5 MG tablet TAKE 1 TABLET BY MOUTH TWICE A DAY WITH A MEAL. Please make yearly appt with Dr. Marlou Porch for December 2022 for future refills. Thank you 1st attempt   Cholecalciferol (VITAMIN D3) 25 MCG (1000 UT) CAPS Take by mouth.   Continuous Blood Gluc Receiver (FREESTYLE LIBRE 14 DAY READER) DEVI APPLY AS DIRECTED   Continuous Blood Gluc Sensor (FREESTYLE LIBRE 14 DAY SENSOR) MISC USE 1 SENSOR EVERY 14 DAYS, THEN REPLACE WITH NEW ONE.   empagliflozin (JARDIANCE) 25 MG TABS tablet Take 1 tablet (25 mg total) by mouth daily.   fluticasone (FLONASE) 50 MCG/ACT nasal spray Place 2 sprays into both nostrils daily.   gabapentin (NEURONTIN) 300 MG capsule TAKE 1 CAPSULE BY MOUTH THREE TIMES A DAY   Glucagon (GVOKE HYPOPEN 2-PACK) 1 MG/0.2ML SOAJ Inject 1 Act into the skin daily as needed.   indapamide (LOZOL) 1.25 MG tablet Take 1.25 mg by mouth daily.   Insulin Glargine-Lixisenatide (SOLIQUA) 100-33 UNT-MCG/ML SOPN Inject 60 Units into the skin daily.   isosorbide mononitrate (IMDUR) 120 MG 24 hr tablet TAKE 1 TABLET BY MOUTH EVERY DAY   levocetirizine (XYZAL) 5 MG tablet TAKE 1 TABLET BY MOUTH EVERY DAY IN THE EVENING   metFORMIN (GLUCOPHAGE) 500 MG tablet TAKE 1 TABLET  (500 MG TOTAL) BY MOUTH 2 (TWO) TIMES DAILY WITH A MEAL.   nitroGLYCERIN (NITROSTAT) 0.4 MG SL tablet SMARTSIG:1 Tablet(s) Sublingual PRN   omega-3 acid ethyl esters (LOVAZA) 1 g capsule Take 2 capsules (2 g total) by mouth 2 (two) times daily.   pantoprazole (PROTONIX) 40 MG tablet TAKE 1 TABLET BY MOUTH TWICE A DAY   ranolazine (RANEXA) 500 MG 12 hr tablet TAKE 1 TABLET BY MOUTH TWICE A DAY   tamsulosin (FLOMAX) 0.4 MG CAPS capsule Take 0.4 mg by mouth once.   traMADol (ULTRAM) 50 MG tablet TAKE 1 TABLET BY MOUTH EVERY 8 HOURS AS NEEDED   No facility-administered encounter medications on file as of 06/28/2021.   Have you had any problems recently with your health?  Have you had any problems with your pharmacy?  What issues or side effects are you having with your medications?  What would you like me to pass along to [facility] for them to help you with?   What can we do to take care of you better?  Star Rating Drugs: Atorvastatin - last fill 04/23/21 90D Metformin - last fill 04/25/21 90D  Reviewed chart for medication changes and drug therapy problems ahead of medication adherence call.  Attempted to contact patient x 3 for medication review and health check, unable to reach patient, left voicemails to return call.  Orinda Kenner, [place] Clinical Pharmacists Assistant [phone]  Time Spent: [phone]

## 2021-07-01 NOTE — Chronic Care Management (AMB) (Signed)
Chronic Care Management    Clinical Social Work Note  07/01/2021 Name: [name] MRN: [id number] DOB: 1951/03/08  [name] is a 70 y.o. year old male who is a primary care patient of [name], MD. The CCM team was consulted to assist the patient with chronic disease management and/or care coordination needs related to: Transportation Needs , [employer] , Level of Care Concerns, and Caregiver Stress.   Engaged with patient by telephone for follow up visit in response to provider referral for social work chronic care management and care coordination services.   Consent to Services:  The patient was given information about Chronic Care Management services, agreed to services, and gave verbal consent prior to initiation of services.  Please see initial visit note for detailed documentation.   Patient agreed to services and consent obtained.   Assessment: Review of patient past medical history, allergies, medications, and health status, including review of relevant consultants reports was performed today as part of a comprehensive evaluation and provision of chronic care management and care coordination services.     SDOH (Social Determinants of Health) assessments and interventions performed:    Advanced Directives Status: Not addressed in this encounter.  CCM Care Plan  Allergies  Allergen Reactions   Furosemide     pancreatitis   Olmesartan Other (See Comments)    hyperkalemia   Amlodipine Swelling    LE EDEMA   Diamox [Acetazolamide] Itching    hives   Lisinopril Rash    Outpatient Encounter Medications as of 06/28/2021  Medication Sig   aspirin 81 MG EC tablet Take 1 tablet (81 mg total) by mouth daily. Swallow whole.   atorvastatin (LIPITOR) 80 MG tablet TAKE 1 TABLET BY MOUTH DAILY AT 6 PM.   carvedilol (COREG) 12.5 MG tablet TAKE 1 TABLET BY MOUTH TWICE A DAY WITH A MEAL. Please make yearly appt with Dr. Marlou Porch for December 2022 for future refills.  Thank you 1st attempt   Cholecalciferol (VITAMIN D3) 25 MCG (1000 UT) CAPS Take by mouth.   Continuous Blood Gluc Receiver (FREESTYLE LIBRE 14 DAY READER) DEVI APPLY AS DIRECTED   Continuous Blood Gluc Sensor (FREESTYLE LIBRE 14 DAY SENSOR) MISC USE 1 SENSOR EVERY 14 DAYS, THEN REPLACE WITH NEW ONE.   empagliflozin (JARDIANCE) 25 MG TABS tablet Take 1 tablet (25 mg total) by mouth daily.   fluticasone (FLONASE) 50 MCG/ACT nasal spray Place 2 sprays into both nostrils daily.   gabapentin (NEURONTIN) 300 MG capsule TAKE 1 CAPSULE BY MOUTH THREE TIMES A DAY   Glucagon (GVOKE HYPOPEN 2-PACK) 1 MG/0.2ML SOAJ Inject 1 Act into the skin daily as needed.   indapamide (LOZOL) 1.25 MG tablet Take 1.25 mg by mouth daily.   Insulin Glargine-Lixisenatide (SOLIQUA) 100-33 UNT-MCG/ML SOPN Inject 60 Units into the skin daily.   isosorbide mononitrate (IMDUR) 120 MG 24 hr tablet TAKE 1 TABLET BY MOUTH EVERY DAY   levocetirizine (XYZAL) 5 MG tablet TAKE 1 TABLET BY MOUTH EVERY DAY IN THE EVENING   metFORMIN (GLUCOPHAGE) 500 MG tablet TAKE 1 TABLET (500 MG TOTAL) BY MOUTH 2 (TWO) TIMES DAILY WITH A MEAL.   nitroGLYCERIN (NITROSTAT) 0.4 MG SL tablet SMARTSIG:1 Tablet(s) Sublingual PRN   omega-3 acid ethyl esters (LOVAZA) 1 g capsule Take 2 capsules (2 g total) by mouth 2 (two) times daily.   pantoprazole (PROTONIX) 40 MG tablet TAKE 1 TABLET BY MOUTH TWICE A DAY   ranolazine (RANEXA) 500 MG 12 hr tablet TAKE  1 TABLET BY MOUTH TWICE A DAY   tamsulosin (FLOMAX) 0.4 MG CAPS capsule Take 0.4 mg by mouth once.   traMADol (ULTRAM) 50 MG tablet TAKE 1 TABLET BY MOUTH EVERY 8 HOURS AS NEEDED   No facility-administered encounter medications on file as of 06/28/2021.    Patient Active Problem List   Diagnosis Date Noted   Age-related cognitive decline 03/07/2021   Chronic renal disease, stage 3, moderately decreased glomerular filtration rate (GFR) between 30-59 mL/min/1.73 square meter (HCC) 08/07/2019   Chronic  bacterial conjunctivitis of right eye 05/26/2019   Primary open angle glaucoma (POAG) of both eyes, severe stage 03/17/2019   Polyp of colon 02/24/2019   Anemia, chronic disease 07/22/2017   Seasonal allergic rhinitis due to pollen 07/22/2017   Chronic diastolic heart failure ([place]) 11/18/2016   Type 2 diabetes mellitus with complication ([place])    Spinal stenosis, lumbar region, with neurogenic claudication 10/26/2015    Class: Chronic   Neurogenic claudication due to lumbar spinal stenosis 10/26/2015   Sleep apnea 10/24/2015   Lumbar radiculopathy 03/21/2015   Diabetic neuropathy ([place]) 02/13/2014   Postlaminectomy syndrome, lumbar region 02/13/2014   Claudication in peripheral vascular disease ([place]) 12/28/2013   POAG (primary open-angle glaucoma) 12/19/2013   GERD (gastroesophageal reflux disease) 08/25/2013   Pure hyperglyceridemia 08/25/2013   Advanced stage glaucoma 01/05/2013   HTN (hypertension) 01/05/2013   Diabetic macular edema of right eye ([place]) 11/25/2012   Neurogenic bladder 10/01/2012   Status post corneal transplant 07/15/2012   Dyslipidemia, goal LDL below 70 04/02/2012   Routine general medical examination at a health care facility 04/02/2012   B12 deficiency anemia 03/31/2012   BPH (benign prostatic hyperplasia) 03/31/2012   Obesity 05/26/2011   ED (erectile dysfunction) 05/26/2011   Coronary atherosclerosis 08/28/2010   Congestive heart failure ([place]) 08/28/2010    Conditions to be addressed/monitored:  blind and care needs ; Level of care concerns, Inability to perform ADL's independently, and Lacks knowledge of community resources  Care Plan : LCSW Plan of Care  Updates made by Deirdre Peer, LCSW since 07/01/2021 12:00 AM     Problem: Functional Decline      Long-Range Goal: Develop plan for management of decline of functional and safety abilities   Start Date: 05/29/2021  Expected End Date: 08/21/2021  This Visit's Progress: On track  Recent Progress:  On track  Priority: High  Note:   Current barriers:   Patient in need of assistance with connecting to community resources for Transportation, Level of care concerns, and Lacks knowledge of community resource: PCS, PACE, etc Acknowledges deficits with meeting this unmet need Patient is unable to independently navigate community resource options without care coordination support Clinical Goals:  patient will work with SW to address concerns related to community support services patient will work with Care Guide to address needs related to transportation, in-home/caregiver support Clinical Interventions:  CSW spoke with pt and his wife by phone. Pt is blind and wife is primary caregiver in home- she also works fulltime (from home) but is finding it more difficult to manage pt. CSW assessed and discussed programs, services and options with them. Pt has partial Medicaid (wife to call DSS to confirm) and is therefore not eligible for Full Medicaid programs- CSW discussed private duty caregiver support (paying out of pocket) and will research other programs but limited options available.  CSW discussed the PACE program, Adult Day Care centers and facility placement (wife was asking about nursing home- he would  not likely meet SNF level but maybe ALF)and will mail wife an FL2 per her request.  Wife also asking about getting transportation assistance- will place Care Guide referral for assistance in looking at what he is eligible for.   Wife also requesting Pharmacy follow up- will request outreach  Collaboration with [name], MD regarding development and update of comprehensive plan of care as evidenced by provider attestation and co-signature Inter-disciplinary care team collaboration (see longitudinal plan of care) Assessment of needs, barriers , agencies contacted, as well as how impacting  Review various resources, discussed options and provided patient information about Transportation and  Limited access to caregiver Collaborated with appropriate clinical care team members regarding patient needs Transportation, Limited access to caregiver, and blindness , CHF, DMII, and blindness  Patient interviewed and appropriate assessments performed Referred patient to community resources care guide team for assistance with transportation, caregiver support Provided patient with information about PACE, PCS, ALF Discussed plans with patient for ongoing care management follow up and provided patient with direct contact information for care management team Assisted patient/caregiver with obtaining information about health plan benefits Provided education to patient/caregiver regarding level of care options. Other interventions provided: Solution-Focused Strategies, Active listening / Reflection utilized , Problem Solving /Task Center , Motivational Interviewing, Brief CBT , and Caregiver stress acknowledged  Patient Goals:  -   - begin a notebook of services in my neighborhood or community - call 211 when I need some help - follow-up on any referrals for help I am given - think ahead to make sure my need does not become an emergency - make a note about what I need to have by the phone or take with me, like an identification card or social security number have a back-up plan - have a back-up plan - make a list of family or friends that I can call  Follow Up Plan: Appointment scheduled for SW follow up with client by phone on: 07/12/2021       Follow Up Plan: [facility] will reach out to patient for assistance with in-home aide support, PACR, FL2,. and Appointment scheduled for SW follow up with client by phone on: 07/12/21      Eduard Clos MSW, LCSW Licensed Clinical Social Worker [place]

## 2021-07-01 NOTE — Patient Instructions (Signed)
Visit Information  PATIENT GOALS:  Goals Addressed             This Visit's Progress    Find Help in My Community       Timeframe:  Long-Range Goal Priority:  High Start Date:   05/29/2021                          Expected End Date:   08/21/2021                    Follow Up Date 07/12/2021    - begin a notebook of services in my neighborhood or community - call 211 when I need some help - follow-up on any referrals for help I am given - think ahead to make sure my need does not become an emergency - make a note about what I need to have by the phone or take with me, like an identification card or social security number have a back-up plan - have a back-up plan - make a list of family or friends that I can call    Why is this important?   Knowing how and where to find help for yourself or family in your neighborhood and community is an important skill.  You will want to take some steps to learn how.    Notes:          The patient verbalized understanding of instructions, educational materials, and care plan provided today and declined offer to receive copy of patient instructions, educational materials, and care plan.   Telephone follow up appointment with care management team member scheduled for:07/12/21  Eduard Clos MSW, [place] Licensed Clinical Social Worker [place] [phone]

## 2021-07-03 ENCOUNTER — Telehealth: Payer: Self-pay | Admitting: *Deleted

## 2021-07-03 NOTE — Telephone Encounter (Signed)
   Telephone encounter was:  Successful.  07/03/2021 Name: [name] MRN: [id number] DOB: 06-Mar-1951  [name] is a 70 y.o. year old male who is a primary care patient of [name], MD . The community resource team was consulted for assistance with   Care guide performed the following interventions: Patient provided with information about care guide support team and interviewed to confirm resource needs.talked with patients wife and she was versed on all topics of needs, transportaion, inhome care , pace , meals on wheels , and other community resources as needed going to mail all of this to her and contact meals on wheels for her  Follow Up Plan:  No further follow up planned at this time. The patient has been provided with needed resources.  [place], Care Management  [phone] [address] , [place] [address] Email : [email or web address]

## 2021-07-05 ENCOUNTER — Telehealth: Payer: Self-pay | Admitting: *Deleted

## 2021-07-05 NOTE — Telephone Encounter (Signed)
   Telephone encounter was:  Unsuccessful.  07/05/2021 Name: [name] MRN: [id number] DOB: [birth year]  Unsuccessful outbound call made today to assist with:  Transportation Needs , Food Insecurity, and Caregiver Stress  Outreach Attempt:  3rd Attempt.  Referral closed unable to contact patient.  A HIPAA compliant voice message was left requesting a return call.  Instructed patient to call back at   Instructed patient to call back at [phone]  at their earliest convenience.   [place], Care Management  [phone] [address] , [place] [address] Email : [email or web address]

## 2021-07-08 DIAGNOSIS — G4733 Obstructive sleep apnea (adult) (pediatric): Secondary | ICD-10-CM | POA: Diagnosis not present

## 2021-07-09 ENCOUNTER — Telehealth: Payer: Self-pay | Admitting: *Deleted

## 2021-07-09 NOTE — Telephone Encounter (Signed)
CSW contacted pt's wife today to clarify pt's diet- per wife, pt is on a regular low sodium diet. Reminded wife of plans for meal delivery as discussed with Care Guide.  Per wife, pt is at top of wait list for Meals on [place] awaiting word from them for update on status.   Eduard Clos MSW, LCSW Licensed Clinical Social Worker [place] [phone]

## 2021-07-10 ENCOUNTER — Telehealth: Payer: Self-pay | Admitting: *Deleted

## 2021-07-10 NOTE — Telephone Encounter (Signed)
   Telephone encounter was:  Successful.  07/10/2021 Name: [name] MRN: [id number] DOB: Nov 13, 1951  [name] is a 70 y.o. year old male who is a primary care patient of [name], MD . The community resource team was consulted for assistance with Transportation Needs , Food Insecurity, and Caregiver Stress  Care guide performed the following interventions: Patient provided with information about care guide support team and interviewed to confirm resource needs Follow up call placed to community resources to determine status of patients referral.talked with MOW and patient is up for a home visit soon by Education officer, museum , applied for moms meals for 2 weeks no further needs at this time  Follow Up Plan:  No further follow up planned at this time. The patient has been provided with needed resources. [place], Care Management  [phone] [address] , [place] [address] Email : [email or web address]

## 2021-07-12 ENCOUNTER — Ambulatory Visit: Payer: PPO | Admitting: *Deleted

## 2021-07-12 DIAGNOSIS — E118 Type 2 diabetes mellitus with unspecified complications: Secondary | ICD-10-CM

## 2021-07-12 DIAGNOSIS — H543 Unqualified visual loss, both eyes: Secondary | ICD-10-CM

## 2021-07-12 NOTE — Chronic Care Management (AMB) (Signed)
Chronic Care Management    Clinical Social Work Note  07/12/2021 Name: [name] MRN: [id number] DOB: 09-Feb-1951  CAID [name] is a 70 y.o. year old male who is a primary care patient of [name], MD. The CCM team was consulted to assist the patient with chronic disease management and/or care coordination needs related to: Transportation Needs , [employer] , Level of Care Concerns, and Caregiver Stress.   Engaged with patient by telephone for follow up visit in response to provider referral for social work chronic care management and care coordination services.   Consent to Services:  The patient was given information about Chronic Care Management services, agreed to services, and gave verbal consent prior to initiation of services.  Please see initial visit note for detailed documentation.   Patient agreed to services and consent obtained.   Assessment: Review of patient past medical history, allergies, medications, and health status, including review of relevant consultants reports was performed today as part of a comprehensive evaluation and provision of chronic care management and care coordination services.     SDOH (Social Determinants of Health) assessments and interventions performed:    Advanced Directives Status: Not addressed in this encounter.  CCM Care Plan  Allergies  Allergen Reactions   Furosemide     pancreatitis   Olmesartan Other (See Comments)    hyperkalemia   Amlodipine Swelling    LE EDEMA   Diamox [Acetazolamide] Itching    hives   Lisinopril Rash    Outpatient Encounter Medications as of 07/12/2021  Medication Sig   aspirin 81 MG EC tablet Take 1 tablet (81 mg total) by mouth daily. Swallow whole.   atorvastatin (LIPITOR) 80 MG tablet TAKE 1 TABLET BY MOUTH DAILY AT 6 PM.   carvedilol (COREG) 12.5 MG tablet TAKE 1 TABLET BY MOUTH TWICE A DAY WITH A MEAL. Please make yearly appt with Dr. Marlou Porch for December 2022 for future refills.  Thank you 1st attempt   Cholecalciferol (VITAMIN D3) 25 MCG (1000 UT) CAPS Take by mouth.   Continuous Blood Gluc Receiver (FREESTYLE LIBRE 14 DAY READER) DEVI APPLY AS DIRECTED   Continuous Blood Gluc Sensor (FREESTYLE LIBRE 14 DAY SENSOR) MISC USE 1 SENSOR EVERY 14 DAYS, THEN REPLACE WITH NEW ONE.   empagliflozin (JARDIANCE) 25 MG TABS tablet Take 1 tablet (25 mg total) by mouth daily.   fluticasone (FLONASE) 50 MCG/ACT nasal spray Place 2 sprays into both nostrils daily.   gabapentin (NEURONTIN) 300 MG capsule TAKE 1 CAPSULE BY MOUTH THREE TIMES A DAY   Glucagon (GVOKE HYPOPEN 2-PACK) 1 MG/0.2ML SOAJ Inject 1 Act into the skin daily as needed.   indapamide (LOZOL) 1.25 MG tablet Take 1.25 mg by mouth daily.   Insulin Glargine-Lixisenatide (SOLIQUA) 100-33 UNT-MCG/ML SOPN Inject 60 Units into the skin daily.   isosorbide mononitrate (IMDUR) 120 MG 24 hr tablet TAKE 1 TABLET BY MOUTH EVERY DAY   levocetirizine (XYZAL) 5 MG tablet TAKE 1 TABLET BY MOUTH EVERY DAY IN THE EVENING   metFORMIN (GLUCOPHAGE) 500 MG tablet TAKE 1 TABLET (500 MG TOTAL) BY MOUTH 2 (TWO) TIMES DAILY WITH A MEAL.   nitroGLYCERIN (NITROSTAT) 0.4 MG SL tablet SMARTSIG:1 Tablet(s) Sublingual PRN   omega-3 acid ethyl esters (LOVAZA) 1 g capsule Take 2 capsules (2 g total) by mouth 2 (two) times daily.   pantoprazole (PROTONIX) 40 MG tablet TAKE 1 TABLET BY MOUTH TWICE A DAY   ranolazine (RANEXA) 500 MG 12 hr tablet TAKE  1 TABLET BY MOUTH TWICE A DAY   tamsulosin (FLOMAX) 0.4 MG CAPS capsule Take 0.4 mg by mouth once.   traMADol (ULTRAM) 50 MG tablet TAKE 1 TABLET BY MOUTH EVERY 8 HOURS AS NEEDED   No facility-administered encounter medications on file as of 07/12/2021.    Patient Active Problem List   Diagnosis Date Noted   Age-related cognitive decline 03/07/2021   Chronic renal disease, stage 3, moderately decreased glomerular filtration rate (GFR) between 30-59 mL/min/1.73 square meter (HCC) 08/07/2019   Chronic  bacterial conjunctivitis of right eye 05/26/2019   Primary open angle glaucoma (POAG) of both eyes, severe stage 03/17/2019   Polyp of colon 02/24/2019   Anemia, chronic disease 07/22/2017   Seasonal allergic rhinitis due to pollen 07/22/2017   Chronic diastolic heart failure ([place]) 11/18/2016   Type 2 diabetes mellitus with complication ([place])    Spinal stenosis, lumbar region, with neurogenic claudication 10/26/2015    Class: Chronic   Neurogenic claudication due to lumbar spinal stenosis 10/26/2015   Sleep apnea 10/24/2015   Lumbar radiculopathy 03/21/2015   Diabetic neuropathy ([place]) 02/13/2014   Postlaminectomy syndrome, lumbar region 02/13/2014   Claudication in peripheral vascular disease ([place]) 12/28/2013   POAG (primary open-angle glaucoma) 12/19/2013   GERD (gastroesophageal reflux disease) 08/25/2013   Pure hyperglyceridemia 08/25/2013   Advanced stage glaucoma 01/05/2013   HTN (hypertension) 01/05/2013   Diabetic macular edema of right eye ([place]) 11/25/2012   Neurogenic bladder 10/01/2012   Status post corneal transplant 07/15/2012   Dyslipidemia, goal LDL below 70 04/02/2012   Routine general medical examination at a health care facility 04/02/2012   B12 deficiency anemia 03/31/2012   BPH (benign prostatic hyperplasia) 03/31/2012   Obesity 05/26/2011   ED (erectile dysfunction) 05/26/2011   Coronary atherosclerosis 08/28/2010   Congestive heart failure ([place]) 08/28/2010    Conditions to be addressed/monitored:  blind ; Transportation, Level of care concerns, and Limited access to caregiver  Care Plan : LCSW Plan of Care  Updates made by Deirdre Peer, LCSW since 07/12/2021 12:00 AM     Problem: Functional Decline      Long-Range Goal: Develop plan for management of decline of functional and safety abilities   Start Date: 05/29/2021  Expected End Date: 08/21/2021  This Visit's Progress: On track  Recent Progress: On track  Priority: High  Note:   Current  barriers:   Patient in need of assistance with connecting to community resources for Transportation, Level of care concerns, and Lacks knowledge of community resource: PCS, PACE, etc Acknowledges deficits with meeting this unmet need Patient is unable to independently navigate community resource options without care coordination support Clinical Goals:  patient will work with SW to address concerns related to community support services patient will work with Care Guide to address needs related to transportation, in-home/caregiver support Clinical Interventions:  CSW spoke with pt and his wife by phone. Pt is blind and wife is primary caregiver in home- she also works fulltime (from home) but is finding it more difficult to manage pt. CSW assessed and discussed programs, services and options with them. Pt has partial Medicaid (wife to call DSS to confirm) and is therefore not eligible for Full Medicaid programs- CSW discussed private duty caregiver support (paying out of pocket) and will research other programs but limited options available.  CSW discussed the PACE program, Adult Day Care centers and facility placement (wife was asking about nursing home- he would not likely meet SNF level but maybe ALF)and  will mail wife an FL2 per her request.  Wife also asking about getting transportation assistance- will place Care Guide referral for assistance in looking at what he is eligible for.   Wife also requesting Pharmacy follow up- will request outreach  Collaboration with [name], MD regarding development and update of comprehensive plan of care as evidenced by provider attestation and co-signature Inter-disciplinary care team collaboration (see longitudinal plan of care) Assessment of needs, barriers , agencies contacted, as well as how impacting  Review various resources, discussed options and provided patient information about Transportation and Limited access to caregiver Collaborated with  appropriate clinical care team members regarding patient needs Transportation, Limited access to caregiver, and blindness , CHF, DMII, and blindness  Patient interviewed and appropriate assessments performed Referred patient to community resources care guide team for assistance with transportation, caregiver support Provided patient with information about PACE, PCS, ALF Discussed plans with patient for ongoing care management follow up and provided patient with direct contact information for care management team Assisted patient/caregiver with obtaining information about health plan benefits Provided education to patient/caregiver regarding level of care options. Other interventions provided: Solution-Focused Strategies, Active listening / Reflection utilized , Problem Solving /Task Center , Motivational Interviewing, Brief CBT , and Caregiver stress acknowledged  Patient Goals:  -   - begin a notebook of services in my neighborhood or community - call 211 when I need some help - follow-up on any referrals for help I am given - think ahead to make sure my need does not become an emergency - make a note about what I need to have by the phone or take with me, like an identification card or social security number have a back-up plan - have a back-up plan - make a list of family or friends that I can call  Follow Up Plan: Appointment scheduled for SW follow up with client by phone on: 08/08/2021       Follow Up Plan: Appointment scheduled for SW follow up with client by phone on: 08/08/21      Eduard Clos MSW, [place] Licensed Clinical Social Worker [place] [phone]

## 2021-07-12 NOTE — Patient Instructions (Signed)
Visit Information  PATIENT GOALS:  Goals Addressed   None     The patient verbalized understanding of instructions, educational materials, and care plan provided today and declined offer to receive copy of patient instructions, educational materials, and care plan.   Telephone follow up appointment with care management team member scheduled for:08/08/21 Tala Eber MSW, LCSW Licensed Clinical Social Worker [place] [phone]

## 2021-07-15 ENCOUNTER — Other Ambulatory Visit: Payer: Self-pay | Admitting: Internal Medicine

## 2021-07-15 DIAGNOSIS — M961 Postlaminectomy syndrome, not elsewhere classified: Secondary | ICD-10-CM

## 2021-07-15 DIAGNOSIS — M5416 Radiculopathy, lumbar region: Secondary | ICD-10-CM

## 2021-07-15 DIAGNOSIS — E0849 Diabetes mellitus due to underlying condition with other diabetic neurological complication: Secondary | ICD-10-CM

## 2021-07-16 ENCOUNTER — Other Ambulatory Visit: Payer: Self-pay | Admitting: Internal Medicine

## 2021-07-16 DIAGNOSIS — I2510 Atherosclerotic heart disease of native coronary artery without angina pectoris: Secondary | ICD-10-CM

## 2021-07-16 DIAGNOSIS — Z794 Long term (current) use of insulin: Secondary | ICD-10-CM

## 2021-07-16 DIAGNOSIS — E119 Type 2 diabetes mellitus without complications: Secondary | ICD-10-CM

## 2021-07-16 DIAGNOSIS — I5042 Chronic combined systolic (congestive) and diastolic (congestive) heart failure: Secondary | ICD-10-CM

## 2021-07-16 DIAGNOSIS — E118 Type 2 diabetes mellitus with unspecified complications: Secondary | ICD-10-CM

## 2021-07-16 MED ORDER — METFORMIN HCL 500 MG PO TABS: 500.0000 mg | ORAL_TABLET | Freq: Two times a day (BID) | ORAL | 1 refills | Status: DC

## 2021-07-16 MED ORDER — SOLIQUA 100-33 UNT-MCG/ML ~~LOC~~ SOPN: 60.0000 [IU] | PEN_INJECTOR | Freq: Every day | SUBCUTANEOUS | 1 refills | Status: DC

## 2021-07-16 MED ORDER — RANOLAZINE ER 500 MG PO TB12: 500.0000 mg | ORAL_TABLET | Freq: Two times a day (BID) | ORAL | 1 refills | Status: DC

## 2021-07-19 NOTE — Progress Notes (Signed)
    Chronic Care Management Pharmacy Assistant   Name: [name]  MRN: [id number] DOB: [birth year]   Reason for Encounter: Chart Review   Pharmacist Review Reviewed chart for medication changes and adherence.  No OVs, Consults, or hospital visits since last care coordination call / Pharmacist visit. No medication changes indicated  No gaps in adherence identified. Patient has follow up scheduled with pharmacy team. No further action required.   Star Rating Drugs: Metformin 500 mg 04/22/21 90 ds Jardiance 25 mg 06/24/21 90 ds Atorvastatin 80 mg 05/20/21 90 ds  Ethelene Hal Clinical Pharmacist Assistant [phone]   Time spent:7

## 2021-07-22 ENCOUNTER — Ambulatory Visit (INDEPENDENT_AMBULATORY_CARE_PROVIDER_SITE_OTHER): Payer: PPO | Admitting: Pharmacist

## 2021-07-22 ENCOUNTER — Other Ambulatory Visit: Payer: Self-pay

## 2021-07-22 DIAGNOSIS — I5042 Chronic combined systolic (congestive) and diastolic (congestive) heart failure: Secondary | ICD-10-CM

## 2021-07-22 DIAGNOSIS — I2510 Atherosclerotic heart disease of native coronary artery without angina pectoris: Secondary | ICD-10-CM

## 2021-07-22 DIAGNOSIS — E118 Type 2 diabetes mellitus with unspecified complications: Secondary | ICD-10-CM

## 2021-07-22 DIAGNOSIS — N182 Chronic kidney disease, stage 2 (mild): Secondary | ICD-10-CM

## 2021-07-22 DIAGNOSIS — E785 Hyperlipidemia, unspecified: Secondary | ICD-10-CM

## 2021-07-22 DIAGNOSIS — I10 Essential (primary) hypertension: Secondary | ICD-10-CM

## 2021-07-22 DIAGNOSIS — Z794 Long term (current) use of insulin: Secondary | ICD-10-CM

## 2021-07-22 MED ORDER — FREESTYLE LIBRE 14 DAY SENSOR MISC: 1 refills | Status: DC

## 2021-07-22 MED ORDER — PEN NEEDLES 32G X 4 MM MISC: 3 refills | Status: DC

## 2021-07-22 NOTE — Progress Notes (Signed)
Chronic Care Management Pharmacy Note  07/22/2021 Name:  [name] MRN:  [id number] DOB:  1951-07-16  Summary: -Pt is confused about CKD diagnosis/staging. Discussed CKD at length and cutoffs for different stages. Currently he falls in Stage 2 (GFR 61)  Recommendations/Changes made from today's visit: -Pt request - Update CKD diagnosis in chart to Stage 2   Subjective: [name] is an 70 y.o. year old male who is a primary patient of [name], MD.  The CCM team was consulted for assistance with disease management and care coordination needs.    Engaged with patient by telephone for follow up visit in response to provider referral for pharmacy case management and/or care coordination services.   Contacted patient's wife to discuss medication issues, as she primarily handles medications and health issues.   Consent to Services:  The patient was given information about Chronic Care Management services, agreed to services, and gave verbal consent prior to initiation of services.  Please see initial visit note for detailed documentation.   Patient Care Team: [name], MD as PCP - General Marlou Porch Thana Farr, MD as PCP - Cardiology (Cardiology) Star Age, MD as Attending Physician (Neurology) Gardiner Barefoot, DPM as Consulting Physician (Podiatry) Charlton Haws, [facility] as Pharmacist (Pharmacist) Deirdre Peer, LCSW as Social Worker (Licensed Holiday representative)   Patient lives at home with his wife. He is legally blind. His wife just started a new job working from home, starts at [employer]  each day. They are interested in getting an in-home aid a few hours a day to help him with ADLs, even if they have to pay for it.  Recent office visits: 11/08/20 Dr [name] OV: chronic f/u; BG above 200 at home. Advised to gradually increase Soliqua until BG < 150. GFR < 60.  Recent consult visits: 04/25/21 Dr Maudie Mercury ([facility]): covid-19. Rx'd molnupiravir (Lagevrio) and  benzonatate  02/18/21 Dr Elisha Ponder (podiatry): onychomycosis  02/04/21 Dr Jeffie Pollock (urology): f/u BPH.  12/03/20 NP Servando Snare [facility] (cardiology): f/u CAD. D/C indapamide d/t patient not taking?  Hospital visits: None in previous 6 months  Objective:  Lab Results  Component Value Date   CREATININE 1.18 06/19/2021   BUN 17 06/19/2021   GFR 61.94 06/19/2021   GFRNONAA >60 07/09/2017   GFRAA >60 07/09/2017   NA 139 06/19/2021   K 4.1 06/19/2021   CALCIUM 8.9 06/19/2021   CO2 31 06/19/2021   GLUCOSE 87 06/19/2021    Lab Results  Component Value Date/Time   HGBA1C 7.9 (H) 06/19/2021 09:39 AM   HGBA1C 8.0 (A) 11/08/2020 01:35 PM   HGBA1C 8.0 (H) 05/08/2020 01:52 PM   GFR 61.94 06/19/2021 09:39 AM   GFR 52.93 (L) 11/08/2020 02:13 PM   MICROALBUR 1.8 06/19/2021 09:39 AM   MICROALBUR 0.5 02/08/2020 04:45 PM    Last diabetic Eye exam:  Lab Results  Component Value Date/Time   HMDIABEYEEXA Retinopathy (A) 07/03/2020 12:00 AM    Last diabetic Foot exam:  Lab Results  Component Value Date/Time   HMDIABFOOTEX normal 04/04/2013 12:00 AM     Lab Results  Component Value Date   CHOL 101 06/19/2021   HDL 28.90 (L) 06/19/2021   LDLCALC 60 06/19/2021   LDLDIRECT 82.7 04/04/2013   TRIG 61.0 06/19/2021   CHOLHDL 3 06/19/2021    Hepatic Function Latest Ref Rng & Units 06/19/2021 02/24/2019 11/24/2017  Total Protein 6.0 - 8.3 g/dL 7.2 7.4 7.0  Albumin 3.5 - 5.2 g/dL 4.1  4.2 4.1  AST 0 - 37 U/L 18 13 13   ALT 0 - 53 U/L 15 13 13   Alk Phosphatase 39 - 117 U/L 40 53 53  Total Bilirubin 0.2 - 1.2 mg/dL 0.5 0.6 0.8  Bilirubin, Direct 0.0 - 0.3 mg/dL 0.1 - -    Lab Results  Component Value Date/Time   TSH 1.42 06/19/2021 09:39 AM   TSH 1.15 02/24/2019 03:56 PM    CBC Latest Ref Rng & Units 06/19/2021 11/08/2020 05/08/2020  WBC 4.0 - 10.5 K/uL 5.1 5.0 6.4  Hemoglobin 13.0 - 17.0 g/dL 12.1(L) 12.5(L) 10.5(L)  Hematocrit 39.0 - 52.0 % 37.7(L) 39.1 33.2(L)  Platelets 150.0 - 400.0 K/uL 239.0  244.0 224.0    No results found for: VD25OH  Clinical ASCVD: Yes  The ASCVD Risk score Mikey Bussing DC Jr., et al., 2013) failed to calculate for the following reasons:   The valid total cholesterol range is 130 to 320 mg/dL    Depression screen [facility] 2/9 03/04/2021 11/08/2020 07/13/2019  Decreased Interest 0 0 1  Down, Depressed, Hopeless 0 0 1  PHQ - 2 Score 0 0 2  Altered sleeping - - -  Tired, decreased energy - - -  Change in appetite - - -  Feeling bad or failure about yourself  - - -  Trouble concentrating - - -  Moving slowly or fidgety/restless - - -  Suicidal thoughts - - -  PHQ-9 Score - - -  Difficult doing work/chores - - -  Some recent data might be hidden      Social History   Tobacco Use  Smoking Status Never  Smokeless Tobacco Never   BP Readings from Last 3 Encounters:  06/19/21 138/76  01/29/21 (!) 143/62  11/08/20 136/68   Pulse Readings from Last 3 Encounters:  06/19/21 65  01/29/21 70  11/08/20 61   Wt Readings from Last 3 Encounters:  06/19/21 218 lb (98.9 kg)  01/29/21 221 lb (100.2 kg)  01/15/21 221 lb (100.2 kg)   BMI Readings from Last 3 Encounters:  06/19/21 31.28 kg/m  01/29/21 31.71 kg/m  01/15/21 30.82 kg/m    Assessment/Interventions: Review of patient past medical history, allergies, medications, health status, including review of consultants reports, laboratory and other test data, was performed as part of comprehensive evaluation and provision of chronic care management services.   SDOH:  (Social Determinants of Health) assessments and interventions performed: Yes  SDOH Screenings   Alcohol Screen: Low Risk    Last Alcohol Screening Score (AUDIT): 0  Depression (PHQ2-9): Low Risk    PHQ-2 Score: 0  Financial Resource Strain: Low Risk    Difficulty of Paying Living Expenses: Not hard at all  Food Insecurity: No Food Insecurity   Worried About Charity fundraiser in the Last Year: Never true   Ran Out of Food in the Last  Year: Never true  Housing: Low Risk    Last Housing Risk Score: 0  Physical Activity: Inactive   Days of Exercise per Week: 0 days   Minutes of Exercise per Session: 0 min  Social Connections: Engineer, building services of Communication with Friends and Family: More than three times a week   Frequency of Social Gatherings with Friends and Family: Once a week   Attends Religious Services: 1 to 4 times per year   Active Member of [employer] or Organizations: No   Attends Archivist Meetings: 1 to 4 times per year   Marital  Status: Married  Stress: No Stress Concern Present   Feeling of Stress : Not at all  Tobacco Use: Low Risk    Smoking Tobacco Use: Never   Smokeless Tobacco Use: Never  Transportation Needs: Unmet Transportation Needs   Lack of Transportation (Medical): Yes   Lack of Transportation (Non-Medical): Yes    CCM Care Plan  Allergies  Allergen Reactions   Furosemide     pancreatitis   Olmesartan Other (See Comments)    hyperkalemia   Amlodipine Swelling    LE EDEMA   Diamox [Acetazolamide] Itching    hives   Lisinopril Rash    Medications Reviewed Today     Reviewed by [name], MD (Physician) on 06/19/21 at 0930  Med List Status: <None>   Medication Order Taking? Sig Documenting Provider Last Dose Status Informant  aspirin 81 MG EC tablet [id number] Yes Take 1 tablet (81 mg total) by mouth daily. Swallow whole. Milus Banister, MD Taking Active   atorvastatin (LIPITOR) 80 MG tablet [id number] Yes TAKE 1 TABLET BY MOUTH DAILY AT 6 PM. [name], MD Taking Active   Blood Glucose Monitoring Suppl (ACCU-CHEK GUIDE ME) w/Device KIT [id number] Yes 1 Act by Does not apply route 3 (three) times daily with meals. [name], MD Taking Active   carvedilol (COREG) 12.5 MG tablet [id number] Yes TAKE 1 TABLET BY MOUTH TWICE A DAY WITH A MEAL Burtis Junes, NP Taking Active   Cholecalciferol (VITAMIN D3) 25 MCG (1000 UT) CAPS [id number] Yes Take  by mouth. [provider] Taking Active            Med Note Domenic Polite Dec 03, 2020  8:29 AM)    Continuous Blood Gluc Receiver (FREESTYLE LIBRE 14 DAY READER) [place] [id number] Yes APPLY AS DIRECTED [name], MD Taking Active   Continuous Blood Gluc Sensor (FREESTYLE LIBRE 14 DAY SENSOR) [place] [id number] Yes USE 1 SENSOR EVERY 14 DAYS, THEN REPLACE WITH NEW ONE. [name], MD Taking Active   empagliflozin (JARDIANCE) 25 MG TABS tablet [id number] Yes Take 1 tablet (25 mg total) by mouth daily. [name], MD Taking Active   fluticasone [facility] 50 MCG/ACT nasal spray [id number] Yes SPRAY 2 SPRAYS INTO EACH NOSTRIL EVERY DAY [name], MD Taking Active   gabapentin (NEURONTIN) 300 MG capsule [id number] Yes TAKE 1 CAPSULE BY MOUTH THREE TIMES A DAY Jessy Oto, MD Taking Active   glucose blood [facility] VERIO) test strip [id number] Yes Use BID to check BS. DX E11.9 [name], MD Taking Active Self  indapamide (LOZOL) 1.25 MG tablet [id number] Yes Take 1.25 mg by mouth daily. [provider] Taking Active   Insulin Glargine-Lixisenatide [facility]) 100-33 UNT-MCG/ML SOPN [id number] Yes Inject 60 Units into the skin daily. [name], MD Taking Active   Insulin Pen Needle (PEN NEEDLES) 32G X 4 MM MISC [id number] Yes Use as directed to inject insulin daily. DX: E11.9 [name], MD Taking Active   isosorbide mononitrate (IMDUR) 120 MG 24 hr tablet [id number] Yes TAKE 1 TABLET BY MOUTH EVERY DAY Jerline Pain, MD Taking Active   levocetirizine (XYZAL) 5 MG tablet [id number] Yes TAKE 1 TABLET BY MOUTH EVERY DAY IN THE Georgette Dover, MD Taking Active   metFORMIN (GLUCOPHAGE) 500 MG tablet [id number] Yes TAKE 1 TABLET (500 MG TOTAL) BY MOUTH 2 (TWO) TIMES DAILY WITH A MEAL. [name], MD Taking Active  NEXLIZET 180-10 MG TABS [id number] Yes TAKE 1 TABLET BY MOUTH DAILY [name], MD Taking Active   nitroGLYCERIN (NITROSTAT) 0.4 MG  SL tablet [id number] Yes SMARTSIG:1 Tablet(s) Sublingual PRN [provider] Taking Active   omega-3 acid ethyl esters (LOVAZA) 1 g capsule [id number] Yes TAKE 2 CAPSULES BY MOUTH 2 TIMES DAILY [name], MD Taking Active   pantoprazole (PROTONIX) 40 MG tablet [id number] Yes TAKE 1 TABLET BY MOUTH TWICE A DAY Jerline Pain, MD Taking Active   PFIZER-BIONTECH COVID-19 VACC 30 MCG/0.3ML injection [id number] Yes  [provider] Taking Active   ranolazine (RANEXA) 500 MG 12 hr tablet [id number] Yes TAKE 1 TABLET BY MOUTH TWICE A DAY [name], MD Taking Active   tamsulosin [facility]) 0.4 MG CAPS capsule [id number] Yes Take 0.4 mg by mouth once. [provider] Taking Active Self  traMADol (ULTRAM) 50 MG tablet [id number] Yes TAKE 1 TABLET BY MOUTH EVERY 8 HOURS AS NEEDED [name], MD Taking Active             Patient Active Problem List   Diagnosis Date Noted   Age-related cognitive decline 03/07/2021   Chronic renal disease, stage 3, moderately decreased glomerular filtration rate (GFR) between 30-59 mL/min/1.73 square meter (HCC) 08/07/2019   Chronic bacterial conjunctivitis of right eye 05/26/2019   Primary open angle glaucoma (POAG) of both eyes, severe stage 03/17/2019   Polyp of colon 02/24/2019   Anemia, chronic disease 07/22/2017   Seasonal allergic rhinitis due to pollen 07/22/2017   Chronic diastolic heart failure ([place]) 11/18/2016   Type 2 diabetes mellitus with complication ([place])    Spinal stenosis, lumbar region, with neurogenic claudication 10/26/2015    Class: Chronic   Neurogenic claudication due to lumbar spinal stenosis 10/26/2015   Sleep apnea 10/24/2015   Lumbar radiculopathy 03/21/2015   Diabetic neuropathy ([place]) 02/13/2014   Postlaminectomy syndrome, lumbar region 02/13/2014   Claudication in peripheral vascular disease ([place]) 12/28/2013   POAG (primary open-angle glaucoma) 12/19/2013   GERD (gastroesophageal reflux disease)  08/25/2013   Pure hyperglyceridemia 08/25/2013   Advanced stage glaucoma 01/05/2013   HTN (hypertension) 01/05/2013   Diabetic macular edema of right eye ([place]) 11/25/2012   Neurogenic bladder 10/01/2012   Status post corneal transplant 07/15/2012   Dyslipidemia, goal LDL below 70 04/02/2012   Routine general medical examination at a health care facility 04/02/2012   B12 deficiency anemia 03/31/2012   BPH (benign prostatic hyperplasia) 03/31/2012   Obesity 05/26/2011   ED (erectile dysfunction) 05/26/2011   Coronary atherosclerosis 08/28/2010   Congestive heart failure ([place]) 08/28/2010    Immunization History  Administered Date(s) Administered   Fluad Quad(high Dose 65+) 02/08/2020, 11/08/2020   Influenza Split 09/12/2012   Influenza Whole 10/28/2010, 10/28/2011   Influenza, High Dose Seasonal PF 08/25/2013, 08/21/2015, 12/09/2016, 02/24/2019   Influenza, Quadrivalent, Recombinant, Inj, Pf 11/04/2017   Influenza,inj,Quad PF,6+ Mos 10/25/2014   PFIZER(Purple Top)SARS-COV-2 Vaccination 04/06/2020, 04/27/2020, 01/14/2021   Pneumococcal Conjugate-13 10/25/2014   Pneumococcal Polysaccharide-23 10/28/2010, 09/13/2012, 02/08/2020   Pneumococcal-Unspecified 07/22/2014   Td 10/22/2010   Tdap 05/08/2020    Conditions to be addressed/monitored:  Hypertension, Hyperlipidemia, Diabetes, Heart Failure, Coronary Artery Disease, GERD and Chronic Kidney Disease  Care Plan : CCM Phamacy Care Plan  Updates made by Charlton Haws, [place] since 07/22/2021 12:00 AM     Problem: Hypertension, Hyperlipidemia, Diabetes, Heart Failure, Coronary Artery Disease, Chronic Kidney Disease   Priority: High     Long-Range Goal: Disease  management   Start Date: 05/09/2021  Expected End Date: 07/22/2022  This Visit's Progress: On track  Recent Progress: On track  Priority: High  Note:   Current Barriers:  Unable to independently monitor therapeutic efficacy  Pharmacist Clinical Goal(s):  Patient will  achieve adherence to monitoring guidelines and medication adherence to achieve therapeutic efficacy through collaboration with PharmD and provider.   Interventions: 1:1 collaboration with [name], MD regarding development and update of comprehensive plan of care as evidenced by provider attestation and co-signature Inter-disciplinary care team collaboration (see longitudinal plan of care) Comprehensive medication review performed; medication list updated in electronic medical record  Hyperlipidemia / CAD (LDL goal < 70) -Controlled - LDL is at goal; pt endorses compliance with medications as prescribed;  -hx CAD: BMS 2008 -Current treatment: Atorvastatin 80 mg daily PM Ranolazine 500 mg BID Omega-3 (Lovaza) BID Aspirin 81 mg daily -Educated on Cholesterol goals;  -Recommended to continue current medication  Diabetes (A1c goal <8%) -Controlled- A1c is at goal; he denies any recent hypoglycemia; he has had hypoglycemia in the past < 55 requiring EMS/glucagon -Current medications: Metformin 500 mg BID Jardiance 25 mg daily Soliqua 60 units daily (20 mcg lixesenatide) Gvoke Hypopen PRN Freestyle Libre 2 -Current home glucose readings fasting glucose: 130-150 post prandial glucose: 195-200s  -Educated on A1c and blood sugar goals; importance of controlling DM to prevent kidney damage -Counseled on GVOKE - use PRN for hypoglycemia if pt is unresponsive -Recommend to continue current medication; refilled pen needles and Libre sensors at patient's request  Hypertension / Heart Failure (Goal: BP < 140/90, prevent exacerbations) -Controlled - pt reports BP is at goal at home; he endorses compliance and denies issues -Last ejection fraction: 60-65% (Date: 06/2017) -HF type: Diastolic -Current treatment: Carvedilol 12.5 mg BID Indapamide 1.25 mg daily AM Isosorbide MN 120 mg daily AM -Educated on Benefits of medications for managing symptoms and prolonging life -Recommended to  continue current medication  CKD Stage 2 (Goal: prevent progression) -Controlled - pt wants to learn more about kidney disease and how to manage it/prevent dialysis -Counseled on CKD staging and GFR cutoffs for different stages - recently patient's GFR has improved so he is now Stage 2 -Counseled on importance of BP and DM control to prevent kidney damage; emphasized importance of hydration;  -Recommend to prioritize controlling DM to protect kidneys long term   Patient Goals/Self-Care Activities Patient will:  - take medications as prescribed -focus on medication adherence by routine -check glucose via [facility], document, and provide at future appointments -check blood pressure weekly, document, and provide at future appointments       Medication Assistance: None required.  Patient affirms current coverage meets needs.  Patient's preferred pharmacy is:  [id number] [id number]: [phone]: [phone] Uses pill box? No - patient has once daily meds separate from BID meds in different-shaped containers Pt endorses 100% compliance  We discussed: Benefits of medication synchronization, packaging and delivery as well as enhanced pharmacist oversight with [employer]. Patient decided to: discuss pharmacy options with wife  Care Plan and Follow Up Patient Decision:  Patient agrees to Care Plan and Follow-up.  Plan: Telephone follow up appointment with care management team member scheduled for:  6 months  LCharlene Brooke PharmD, [place] CPP Clinical Pharmacist [phone]

## 2021-07-22 NOTE — Patient Instructions (Signed)
Visit Information  Phone number for Pharmacist: [phone]   Goals Addressed             This Visit's Progress    Manage My Diet       Timeframe:  Long-Range Goal Priority:  High Start Date:    05/09/21                         Expected End Date:    07/22/22               Follow Up Date Feb 2022   - ask for help if I have trouble affording healthy foods - choose foods low in fat and sugar - choose foods that are low in sodium (salt) - keep healthy snacks on hand - watch for swelling in feet, ankles and legs every day    Why is this important?   A healthy diet is important for mental and physical health.  Healthy food helps repair damaged body tissue and maintains strong bones and muscles.  No single food is just right so eating a variety of proteins, fruits, vegetables and grains is best.  You may need to change what you eat or drink to manage kidney disease.  A dietitian is the best person to guide you.     Notes:      Manage My Medicine       Timeframe:  Long-Range Goal Priority:  Medium Start Date:    05/09/21                         Expected End Date:    07/22/22              Follow Up Date Feb 2022   - call for medicine refill 2 or 3 days before it runs out - call if I am sick and can't take my medicine - keep a list of all the medicines I take; vitamins and herbals too    Why is this important?   These steps will help you keep on track with your medicines.   Notes:         Care Plan : CCM Phamacy Care Plan  Updates made by Charlton Haws, RPH since 07/22/2021 12:00 AM     Problem: Hypertension, Hyperlipidemia, Diabetes, Heart Failure, Coronary Artery Disease, Chronic Kidney Disease   Priority: High     Long-Range Goal: Disease management   Start Date: 05/09/2021  Expected End Date: 07/22/2022  This Visit's Progress: On track  Recent Progress: On track  Priority: High  Note:   Current Barriers:  Unable to independently monitor therapeutic  efficacy  Pharmacist Clinical Goal(s):  Patient will achieve adherence to monitoring guidelines and medication adherence to achieve therapeutic efficacy through collaboration with PharmD and provider.   Interventions: 1:1 collaboration with [name], MD regarding development and update of comprehensive plan of care as evidenced by provider attestation and co-signature Inter-disciplinary care team collaboration (see longitudinal plan of care) Comprehensive medication review performed; medication list updated in electronic medical record  Hyperlipidemia / CAD (LDL goal < 70) -Controlled - LDL is at goal; pt endorses compliance with medications as prescribed;  -hx CAD: BMS 2008 -Current treatment: Atorvastatin 80 mg daily PM Ranolazine 500 mg BID Omega-3 (Lovaza) BID Aspirin 81 mg daily -Educated on Cholesterol goals;  -Recommended to continue current medication  Diabetes (A1c goal <8%) -Controlled- A1c is at goal; he denies  any recent hypoglycemia; he has had hypoglycemia in the past < 55 requiring EMS/glucagon -Current medications: Metformin 500 mg BID Jardiance 25 mg daily Soliqua 60 units daily (20 mcg lixesenatide) Gvoke Hypopen PRN Freestyle Libre 2 -Current home glucose readings fasting glucose: 130-150 post prandial glucose: 195-200s  -Educated on A1c and blood sugar goals; importance of controlling DM to prevent kidney damage -Counseled on GVOKE - use PRN for hypoglycemia if pt is unresponsive -Recommend to continue current medication; refilled pen needles and Libre sensors at patient's request  Hypertension / Heart Failure (Goal: BP < 140/90, prevent exacerbations) -Controlled - pt reports BP is at goal at home; he endorses compliance and denies issues -Last ejection fraction: 60-65% (Date: 06/2017) -HF type: Diastolic -Current treatment: Carvedilol 12.5 mg BID Indapamide 1.25 mg daily AM Isosorbide MN 120 mg daily AM -Educated on Benefits of medications for  managing symptoms and prolonging life -Recommended to continue current medication  CKD Stage 2 (Goal: prevent progression) -Controlled - pt wants to learn more about kidney disease and how to manage it/prevent dialysis -Counseled on CKD staging and GFR cutoffs for different stages - recently patient's GFR has improved so he is now Stage 2 -Counseled on importance of BP and DM control to prevent kidney damage; emphasized importance of hydration;  -Recommend to prioritize controlling DM to protect kidneys long term   Patient Goals/Self-Care Activities Patient will:  - take medications as prescribed -focus on medication adherence by routine -check glucose via [facility], document, and provide at future appointments -check blood pressure weekly, document, and provide at future appointments        Patient verbalizes understanding of instructions provided today and agrees to view in [place].  Telephone follow up appointment with pharmacy team member scheduled for: 6 months  Charlene Brooke, PharmD, [place], CPP Clinical Pharmacist [place] [facility] [phone]

## 2021-07-25 ENCOUNTER — Other Ambulatory Visit: Payer: Self-pay | Admitting: Internal Medicine

## 2021-07-25 DIAGNOSIS — E118 Type 2 diabetes mellitus with unspecified complications: Secondary | ICD-10-CM

## 2021-07-29 ENCOUNTER — Other Ambulatory Visit: Payer: Self-pay | Admitting: Internal Medicine

## 2021-07-29 DIAGNOSIS — N182 Chronic kidney disease, stage 2 (mild): Secondary | ICD-10-CM

## 2021-08-08 ENCOUNTER — Ambulatory Visit: Payer: PPO | Admitting: *Deleted

## 2021-08-08 DIAGNOSIS — N182 Chronic kidney disease, stage 2 (mild): Secondary | ICD-10-CM

## 2021-08-08 DIAGNOSIS — Z794 Long term (current) use of insulin: Secondary | ICD-10-CM

## 2021-08-08 DIAGNOSIS — H543 Unqualified visual loss, both eyes: Secondary | ICD-10-CM

## 2021-08-08 DIAGNOSIS — E118 Type 2 diabetes mellitus with unspecified complications: Secondary | ICD-10-CM

## 2021-08-08 NOTE — Patient Instructions (Signed)
Visit Information  PATIENT GOALS:  Goals Addressed   None     The patient verbalized understanding of instructions, educational materials, and care plan provided today and declined offer to receive copy of patient instructions, educational materials, and care plan.   Telephone follow up appointment with care management team member scheduled for:8*31/22  Eduard Clos MSW, [place] Licensed Clinical Social Worker [place] [phone]

## 2021-08-08 NOTE — Chronic Care Management (AMB) (Signed)
Chronic Care Management    Clinical Social Work Note  08/08/2021 Name: [name] MRN: [id number] DOB: [birth year]  [name] is a 70 y.o. year old male who is a primary care patient of [name], MD. The CCM team was consulted to assist the patient with chronic disease management and/or care coordination needs related to: [employer] , Level of Care Concerns, and Caregiver Stress.   Engaged with patient by telephone for follow up visit in response to provider referral for social work chronic care management and care coordination services.   Consent to Services:  The patient was given information about Chronic Care Management services, agreed to services, and gave verbal consent prior to initiation of services.  Please see initial visit note for detailed documentation.   Patient agreed to services and consent obtained.   Assessment: Review of patient past medical history, allergies, medications, and health status, including review of relevant consultants reports was performed today as part of a comprehensive evaluation and provision of chronic care management and care coordination services.     SDOH (Social Determinants of Health) assessments and interventions performed:    Advanced Directives Status: Not addressed in this encounter.  CCM Care Plan  Allergies  Allergen Reactions   Furosemide     pancreatitis   Olmesartan Other (See Comments)    hyperkalemia   Amlodipine Swelling    LE EDEMA   Diamox [Acetazolamide] Itching    hives   Lisinopril Rash    Outpatient Encounter Medications as of 08/08/2021  Medication Sig   aspirin 81 MG EC tablet Take 1 tablet (81 mg total) by mouth daily. Swallow whole.   atorvastatin (LIPITOR) 80 MG tablet TAKE 1 TABLET BY MOUTH DAILY AT 6 PM.   carvedilol (COREG) 12.5 MG tablet TAKE 1 TABLET BY MOUTH TWICE A DAY WITH A MEAL. Please make yearly appt with Dr. Marlou Porch for December 2022 for future refills. Thank you 1st attempt    Cholecalciferol (VITAMIN D3) 25 MCG (1000 UT) CAPS Take by mouth.   Continuous Blood Gluc Receiver (FREESTYLE LIBRE 14 DAY READER) DEVI APPLY AS DIRECTED   Continuous Blood Gluc Sensor (FREESTYLE LIBRE 14 DAY SENSOR) MISC USE 1 SENSOR EVERY 14 DAYS, THEN REPLACE WITH NEW ONE.   empagliflozin (JARDIANCE) 25 MG TABS tablet Take 1 tablet (25 mg total) by mouth daily.   fluticasone (FLONASE) 50 MCG/ACT nasal spray Place 2 sprays into both nostrils daily.   gabapentin (NEURONTIN) 300 MG capsule TAKE 1 CAPSULE BY MOUTH THREE TIMES A DAY   Glucagon (GVOKE HYPOPEN 2-PACK) 1 MG/0.2ML SOAJ Inject 1 Act into the skin daily as needed.   indapamide (LOZOL) 1.25 MG tablet Take 1.25 mg by mouth daily.   Insulin Glargine-Lixisenatide (SOLIQUA) 100-33 UNT-MCG/ML SOPN Inject 60 Units into the skin daily.   Insulin Pen Needle (PEN NEEDLES) 32G X 4 MM MISC Use with insulin pen daily as directed   isosorbide mononitrate (IMDUR) 120 MG 24 hr tablet TAKE 1 TABLET BY MOUTH EVERY DAY   levocetirizine (XYZAL) 5 MG tablet TAKE 1 TABLET BY MOUTH EVERY DAY IN THE EVENING   metFORMIN (GLUCOPHAGE) 500 MG tablet Take 1 tablet (500 mg total) by mouth 2 (two) times daily with a meal.   nitroGLYCERIN (NITROSTAT) 0.4 MG SL tablet SMARTSIG:1 Tablet(s) Sublingual PRN   omega-3 acid ethyl esters (LOVAZA) 1 g capsule Take 2 capsules (2 g total) by mouth 2 (two) times daily.   pantoprazole (PROTONIX) 40 MG tablet TAKE 1  TABLET BY MOUTH TWICE A DAY   ranolazine (RANEXA) 500 MG 12 hr tablet Take 1 tablet (500 mg total) by mouth 2 (two) times daily.   tamsulosin (FLOMAX) 0.4 MG CAPS capsule Take 0.4 mg by mouth once.   traMADol (ULTRAM) 50 MG tablet TAKE 1 TABLET BY MOUTH EVERY 8 HOURS AS NEEDED   No facility-administered encounter medications on file as of 08/08/2021.    Patient Active Problem List   Diagnosis Date Noted   CKD (chronic kidney disease) stage 2, GFR 60-89 ml/min 07/29/2021   Age-related cognitive decline 03/07/2021    Chronic bacterial conjunctivitis of right eye 05/26/2019   Primary open angle glaucoma (POAG) of both eyes, severe stage 03/17/2019   Polyp of colon 02/24/2019   Anemia, chronic disease 07/22/2017   Seasonal allergic rhinitis due to pollen 07/22/2017   Chronic diastolic heart failure ([place]) 11/18/2016   Type 2 diabetes mellitus with complication ([place])    Spinal stenosis, lumbar region, with neurogenic claudication 10/26/2015    Class: Chronic   Neurogenic claudication due to lumbar spinal stenosis 10/26/2015   Sleep apnea 10/24/2015   Lumbar radiculopathy 03/21/2015   Diabetic neuropathy ([place]) 02/13/2014   Postlaminectomy syndrome, lumbar region 02/13/2014   Claudication in peripheral vascular disease ([place]) 12/28/2013   POAG (primary open-angle glaucoma) 12/19/2013   GERD (gastroesophageal reflux disease) 08/25/2013   Pure hyperglyceridemia 08/25/2013   Advanced stage glaucoma 01/05/2013   HTN (hypertension) 01/05/2013   Diabetic macular edema of right eye ([place]) 11/25/2012   Neurogenic bladder 10/01/2012   Status post corneal transplant 07/15/2012   Dyslipidemia, goal LDL below 70 04/02/2012   Routine general medical examination at a health care facility 04/02/2012   B12 deficiency anemia 03/31/2012   BPH (benign prostatic hyperplasia) 03/31/2012   Obesity 05/26/2011   ED (erectile dysfunction) 05/26/2011   Coronary atherosclerosis 08/28/2010   Congestive heart failure ([place]) 08/28/2010    Conditions to be addressed/monitored:  blind, caregiver needs ; Level of care concerns, ADL IADL limitations, Limited access to caregiver, and Lacks knowledge of community resource:    Care Plan : LCSW Plan of Care  Updates made by Deirdre Peer, LCSW since 08/08/2021 12:00 AM     Problem: Functional Decline      Long-Range Goal: Develop plan for management of decline of functional and safety abilities   Start Date: 05/29/2021  Expected End Date: 08/21/2021  This Visit's Progress: On  track  Recent Progress: On track  Priority: High  Note:   Current barriers:   Patient in need of assistance with connecting to community resources for Transportation, Level of care concerns, and Lacks knowledge of community resource: PCS, PACE, etc Acknowledges deficits with meeting this unmet need Patient is unable to independently navigate community resource options without care coordination support Clinical Goals:  patient will work with SW to address concerns related to community support services patient will work with Care Guide to address needs related to transportation, in-home/caregiver support Clinical Interventions:  CSW spoke with pt by phone. Pt reports no issues or concerns. He reports no further conversations about long term plans/placement for him. Wife is not available; pt is reminded to have her call me if needed/desired.  Pt confirms Care Guide support received and no questions at this time.   Collaboration with [name], MD regarding development and update of comprehensive plan of care as evidenced by provider attestation and co-signature Inter-disciplinary care team collaboration (see longitudinal plan of care) Assessment of needs, barriers ,  agencies contacted, as well as how impacting  Review various resources, discussed options and provided patient information about Transportation and Limited access to caregiver Collaborated with appropriate clinical care team members regarding patient needs Transportation, Limited access to caregiver, and blindness , CHF, DMII, and blindness  Patient interviewed and appropriate assessments performed Referred patient to community resources care guide team for assistance with transportation, caregiver support Provided patient with information about PACE, PCS, ALF Discussed plans with patient for ongoing care management follow up and provided patient with direct contact information for care management team Assisted  patient/caregiver with obtaining information about health plan benefits Provided education to patient/caregiver regarding level of care options. Other interventions provided: Solution-Focused Strategies, Active listening / Reflection utilized , Problem Solving /Task Center , Motivational Interviewing, Brief CBT , and Caregiver stress acknowledged  Patient Goals:  - begin a notebook of services in my neighborhood or community - call 211 when I need some help - follow-up on any referrals for help I am given - think ahead to make sure my need does not become an emergency - make a note about what I need to have by the phone or take with me, like an identification card or social security number have a back-up plan - have a back-up plan - make a list of family or friends that I can call  Follow Up Plan: Appointment scheduled for SW follow up with client by phone on: 08/21/2021       Follow Up Plan: Appointment scheduled for SW follow up with client by phone on: 08/21/21      Eduard Clos MSW, [place] Licensed Clinical Social Worker [place] [phone]

## 2021-08-09 DIAGNOSIS — Z4420 Encounter for fitting and adjustment of artificial eye, unspecified: Secondary | ICD-10-CM | POA: Diagnosis not present

## 2021-08-21 ENCOUNTER — Ambulatory Visit: Payer: PPO | Admitting: *Deleted

## 2021-08-21 DIAGNOSIS — Z794 Long term (current) use of insulin: Secondary | ICD-10-CM

## 2021-08-21 DIAGNOSIS — I2510 Atherosclerotic heart disease of native coronary artery without angina pectoris: Secondary | ICD-10-CM | POA: Diagnosis not present

## 2021-08-21 DIAGNOSIS — E785 Hyperlipidemia, unspecified: Secondary | ICD-10-CM | POA: Diagnosis not present

## 2021-08-21 DIAGNOSIS — I10 Essential (primary) hypertension: Secondary | ICD-10-CM

## 2021-08-21 DIAGNOSIS — N182 Chronic kidney disease, stage 2 (mild): Secondary | ICD-10-CM

## 2021-08-21 DIAGNOSIS — H543 Unqualified visual loss, both eyes: Secondary | ICD-10-CM

## 2021-08-21 DIAGNOSIS — E118 Type 2 diabetes mellitus with unspecified complications: Secondary | ICD-10-CM

## 2021-08-21 DIAGNOSIS — I5042 Chronic combined systolic (congestive) and diastolic (congestive) heart failure: Secondary | ICD-10-CM

## 2021-08-22 NOTE — Chronic Care Management (AMB) (Signed)
Chronic Care Management    Clinical Social Work Note  08/22/2021 Name: [name] MRN: [id number] DOB: 1951/09/11  [name] is a 70 y.o. year old male who is a primary care patient of [name], MD. The CCM team was consulted to assist the patient with chronic disease management and/or care coordination needs related to: [employer] , [employer], and Caregiver Stress.   Engaged with patient by telephone for follow up visit in response to provider referral for social work chronic care management and care coordination services.   Consent to Services:  The patient was given information about Chronic Care Management services, agreed to services, and gave verbal consent prior to initiation of services.  Please see initial visit note for detailed documentation.   Patient agreed to services and consent obtained.   Assessment: Review of patient past medical history, allergies, medications, and health status, including review of relevant consultants reports was performed today as part of a comprehensive evaluation and provision of chronic care management and care coordination services.     SDOH (Social Determinants of Health) assessments and interventions performed:    Advanced Directives Status: Not addressed in this encounter.  CCM Care Plan  Allergies  Allergen Reactions   Furosemide     pancreatitis   Olmesartan Other (See Comments)    hyperkalemia   Amlodipine Swelling    LE EDEMA   Diamox [Acetazolamide] Itching    hives   Lisinopril Rash    Outpatient Encounter Medications as of 08/21/2021  Medication Sig   aspirin 81 MG EC tablet Take 1 tablet (81 mg total) by mouth daily. Swallow whole.   atorvastatin (LIPITOR) 80 MG tablet TAKE 1 TABLET BY MOUTH DAILY AT 6 PM.   carvedilol (COREG) 12.5 MG tablet TAKE 1 TABLET BY MOUTH TWICE A DAY WITH A MEAL. Please make yearly appt with Dr. Marlou Porch for December 2022 for future refills. Thank you 1st attempt    Cholecalciferol (VITAMIN D3) 25 MCG (1000 UT) CAPS Take by mouth.   Continuous Blood Gluc Receiver (FREESTYLE LIBRE 14 DAY READER) DEVI APPLY AS DIRECTED   Continuous Blood Gluc Sensor (FREESTYLE LIBRE 14 DAY SENSOR) MISC USE 1 SENSOR EVERY 14 DAYS, THEN REPLACE WITH NEW ONE.   empagliflozin (JARDIANCE) 25 MG TABS tablet Take 1 tablet (25 mg total) by mouth daily.   fluticasone (FLONASE) 50 MCG/ACT nasal spray Place 2 sprays into both nostrils daily.   gabapentin (NEURONTIN) 300 MG capsule TAKE 1 CAPSULE BY MOUTH THREE TIMES A DAY   Glucagon (GVOKE HYPOPEN 2-PACK) 1 MG/0.2ML SOAJ Inject 1 Act into the skin daily as needed.   indapamide (LOZOL) 1.25 MG tablet Take 1.25 mg by mouth daily.   Insulin Glargine-Lixisenatide (SOLIQUA) 100-33 UNT-MCG/ML SOPN Inject 60 Units into the skin daily.   Insulin Pen Needle (PEN NEEDLES) 32G X 4 MM MISC Use with insulin pen daily as directed   isosorbide mononitrate (IMDUR) 120 MG 24 hr tablet TAKE 1 TABLET BY MOUTH EVERY DAY   levocetirizine (XYZAL) 5 MG tablet TAKE 1 TABLET BY MOUTH EVERY DAY IN THE EVENING   metFORMIN (GLUCOPHAGE) 500 MG tablet Take 1 tablet (500 mg total) by mouth 2 (two) times daily with a meal.   nitroGLYCERIN (NITROSTAT) 0.4 MG SL tablet SMARTSIG:1 Tablet(s) Sublingual PRN   omega-3 acid ethyl esters (LOVAZA) 1 g capsule Take 2 capsules (2 g total) by mouth 2 (two) times daily.   pantoprazole (PROTONIX) 40 MG tablet TAKE 1 TABLET BY  MOUTH TWICE A DAY   ranolazine (RANEXA) 500 MG 12 hr tablet Take 1 tablet (500 mg total) by mouth 2 (two) times daily.   tamsulosin (FLOMAX) 0.4 MG CAPS capsule Take 0.4 mg by mouth once.   traMADol (ULTRAM) 50 MG tablet TAKE 1 TABLET BY MOUTH EVERY 8 HOURS AS NEEDED   No facility-administered encounter medications on file as of 08/21/2021.    Patient Active Problem List   Diagnosis Date Noted   CKD (chronic kidney disease) stage 2, GFR 60-89 ml/min 07/29/2021   Age-related cognitive decline 03/07/2021    Chronic bacterial conjunctivitis of right eye 05/26/2019   Primary open angle glaucoma (POAG) of both eyes, severe stage 03/17/2019   Polyp of colon 02/24/2019   Anemia, chronic disease 07/22/2017   Seasonal allergic rhinitis due to pollen 07/22/2017   Chronic diastolic heart failure ([place]) 11/18/2016   Type 2 diabetes mellitus with complication ([place])    Spinal stenosis, lumbar region, with neurogenic claudication 10/26/2015    Class: Chronic   Neurogenic claudication due to lumbar spinal stenosis 10/26/2015   Sleep apnea 10/24/2015   Lumbar radiculopathy 03/21/2015   Diabetic neuropathy ([place]) 02/13/2014   Postlaminectomy syndrome, lumbar region 02/13/2014   Claudication in peripheral vascular disease ([place]) 12/28/2013   POAG (primary open-angle glaucoma) 12/19/2013   GERD (gastroesophageal reflux disease) 08/25/2013   Pure hyperglyceridemia 08/25/2013   Advanced stage glaucoma 01/05/2013   HTN (hypertension) 01/05/2013   Diabetic macular edema of right eye ([place]) 11/25/2012   Neurogenic bladder 10/01/2012   Status post corneal transplant 07/15/2012   Dyslipidemia, goal LDL below 70 04/02/2012   Routine general medical examination at a health care facility 04/02/2012   B12 deficiency anemia 03/31/2012   BPH (benign prostatic hyperplasia) 03/31/2012   Obesity 05/26/2011   ED (erectile dysfunction) 05/26/2011   Coronary atherosclerosis 08/28/2010   Congestive heart failure ([place]) 08/28/2010    Conditions to be addressed/monitored: CAD, HTN, and DMII; Limited access to food, Limited access to caregiver, and Lacks knowledge of community resource:    Care Plan : LCSW Plan of Care  Updates made by Deirdre Peer, LCSW since 08/22/2021 12:00 AM     Problem: Functional Decline      Long-Range Goal: Develop plan for management of decline of functional and safety abilities   Start Date: 05/29/2021  Expected End Date: 09/20/2021  This Visit's Progress: On track  Recent Progress: On track   Priority: High  Note:   Current barriers:   Patient in need of assistance with connecting to community resources for Transportation, Level of care concerns, and Lacks knowledge of community resource: PCS, PACE, etc Acknowledges deficits with meeting this unmet need Patient is unable to independently navigate community resource options without care coordination support Clinical Goals:  patient will work with SW to address concerns related to community support services patient will work with Care Guide to address needs related to transportation, in-home/caregiver support Clinical Interventions:  CSW spoke with pt by phone. Pt reports no issues or concerns. He reports no further conversations about long term plans/placement for him. CSW updated pt on his status for Meals on Wheels and has also updated his wife to options for picking up frozen meals (2 boxes twice monthly) until he is able to receive the "hot meals" delivered to home 5 days weekly.  Both pt and wife are aware they can arrange for someone to pickup the frozen meals for them if desired and that they anticipate him being on the  delivery route soon.    Collaboration with [name], MD regarding development and update of comprehensive plan of care as evidenced by provider attestation and co-signature Inter-disciplinary care team collaboration (see longitudinal plan of care) Assessment of needs, barriers , agencies contacted, as well as how impacting  Review various resources, discussed options and provided patient information about Transportation and Limited access to caregiver Collaborated with appropriate clinical care team members regarding patient needs Transportation, Limited access to caregiver, and blindness , CHF, DMII, and blindness  Patient interviewed and appropriate assessments performed Referred patient to community resources care guide team for assistance with transportation, caregiver support Provided patient with  information about PACE, PCS, ALF Discussed plans with patient for ongoing care management follow up and provided patient with direct contact information for care management team Assisted patient/caregiver with obtaining information about health plan benefits Provided education to patient/caregiver regarding level of care options. Other interventions provided: Solution-Focused Strategies, Active listening / Reflection utilized , Problem Solving /Task Center , Motivational Interviewing, Brief CBT , and Caregiver stress acknowledged  Patient Goals:  - expect a phone call about hot meal delivery  -begin a notebook of services in my neighborhood or community - call 211 when I need some help - follow-up on any referrals for help I am given - think ahead to make sure my need does not become an emergency - make a note about what I need to have by the phone or take with me, like an identification card or social security number have a back-up plan - have a back-up plan - make a list of family or friends that I can call  Follow Up Plan: Appointment scheduled for SW follow up with client by phone on: 09/23/2021       Follow Up Plan: Appointment scheduled for SW follow up with client by phone on: 09/23/21      Eduard Clos MSW, [place] Licensed Clinical Social Worker [place] [phone]

## 2021-08-22 NOTE — Patient Instructions (Signed)
Visit Information  PATIENT GOALS:  Goals Addressed             This Visit's Progress    Find Help in My Community       Timeframe:  Long-Range Goal Priority:  High Start Date:   05/29/2021                          Expected End Date:   08/21/2021                    Follow Up Date 09/23/2021   -expect a phone call about hot meal delivery - begin a notebook of services in my neighborhood or community - call 211 when I need some help - follow-up on any referrals for help I am given - think ahead to make sure my need does not become an emergency - make a note about what I need to have by the phone or take with me, like an identification card or social security number have a back-up plan - have a back-up plan - make a list of family or friends that I can call    Why is this important?   Knowing how and where to find help for yourself or family in your neighborhood and community is an important skill.  You will want to take some steps to learn how.    Notes:         The patient verbalized understanding of instructions, educational materials, and care plan provided today and declined offer to receive copy of patient instructions, educational materials, and care plan.   Telephone follow up appointment with care management team member scheduled for:09/23/21  Eduard Clos MSW, LCSW Licensed Clinical Social Worker [place] [phone]

## 2021-09-05 ENCOUNTER — Other Ambulatory Visit: Payer: Self-pay | Admitting: Internal Medicine

## 2021-09-05 DIAGNOSIS — E785 Hyperlipidemia, unspecified: Secondary | ICD-10-CM

## 2021-09-05 DIAGNOSIS — I2510 Atherosclerotic heart disease of native coronary artery without angina pectoris: Secondary | ICD-10-CM

## 2021-09-17 ENCOUNTER — Other Ambulatory Visit: Payer: Self-pay | Admitting: Internal Medicine

## 2021-09-17 DIAGNOSIS — I2510 Atherosclerotic heart disease of native coronary artery without angina pectoris: Secondary | ICD-10-CM

## 2021-09-17 DIAGNOSIS — E785 Hyperlipidemia, unspecified: Secondary | ICD-10-CM

## 2021-09-23 ENCOUNTER — Ambulatory Visit: Payer: PPO | Admitting: Podiatry

## 2021-09-24 ENCOUNTER — Telehealth: Payer: Self-pay | Admitting: *Deleted

## 2021-09-24 NOTE — Telephone Encounter (Signed)
  Care Management   Follow Up Note   09/24/2021 Name: [name] MRN: [id number] DOB: 12/30/1950   Referred by: [name], MD Reason for referral : No chief complaint on file.   A second unsuccessful telephone outreach was attempted today. The patient was referred to the case management team for assistance with care management and care coordination.   Follow Up Plan: Telephone follow up appointment with care management team member scheduled for:7-10 days  Eduard Clos MSW, LCSW Licensed Clinical Social Worker [place] [phone]

## 2021-10-02 ENCOUNTER — Other Ambulatory Visit: Payer: Self-pay | Admitting: Internal Medicine

## 2021-10-02 DIAGNOSIS — E119 Type 2 diabetes mellitus without complications: Secondary | ICD-10-CM

## 2021-10-02 DIAGNOSIS — Z794 Long term (current) use of insulin: Secondary | ICD-10-CM

## 2021-10-02 DIAGNOSIS — J301 Allergic rhinitis due to pollen: Secondary | ICD-10-CM

## 2021-10-02 DIAGNOSIS — E118 Type 2 diabetes mellitus with unspecified complications: Secondary | ICD-10-CM

## 2021-10-02 DIAGNOSIS — I2510 Atherosclerotic heart disease of native coronary artery without angina pectoris: Secondary | ICD-10-CM

## 2021-10-02 DIAGNOSIS — E785 Hyperlipidemia, unspecified: Secondary | ICD-10-CM

## 2021-10-04 ENCOUNTER — Telehealth: Payer: Self-pay

## 2021-10-04 NOTE — Chronic Care Management (AMB) (Signed)
  Care Management   Note  10/04/2021 Name: [name] MRN: [id number] DOB: [birth year]  [name] is a 70 y.o. year old male who is a primary care patient of [name], MD and is actively engaged with the care management team. I reached out to [facility] by phone today to assist with re-scheduling a follow up visit with the Licensed Clinical Social Worker  Follow up plan: Unsuccessful telephone outreach attempt made. A HIPAA compliant phone message was left for the patient providing contact information and requesting a return call.  The care management team will reach out to the patient again over the next 7 days.  If patient returns call to provider office, please advise to call [facility] [place], [address], [place], [place] [address] Direct Dial: [phone] [email or web address]

## 2021-10-07 ENCOUNTER — Telehealth: Payer: PPO

## 2021-10-08 ENCOUNTER — Telehealth: Payer: Self-pay | Admitting: *Deleted

## 2021-10-08 NOTE — Telephone Encounter (Signed)
  Care Management   Follow Up Note   10/08/2021 Name: [name] MRN: [id number] DOB: [birth year]   Referred by: [name], MD Reason for referral : No chief complaint on file.   Third unsuccessful telephone outreach was attempted today. The patient was referred to the case management team for assistance with care management and care coordination. The patient's primary care provider has been notified of our unsuccessful attempts to make or maintain contact with the patient. The care management team is pleased to engage with this patient at any time in the future should he/she be interested in assistance from the care management team.   Follow Up Plan: The care management team will reach out to the patient again over the next 30 days.   Eduard Clos MSW, LCSW Licensed Clinical Social Worker [place] [phone]

## 2021-10-09 DIAGNOSIS — G4733 Obstructive sleep apnea (adult) (pediatric): Secondary | ICD-10-CM | POA: Diagnosis not present

## 2021-10-11 NOTE — Progress Notes (Signed)
LCSW has rescheduled appointment for 11/04/2021

## 2021-10-19 ENCOUNTER — Other Ambulatory Visit: Payer: Self-pay | Admitting: Internal Medicine

## 2021-10-19 DIAGNOSIS — E118 Type 2 diabetes mellitus with unspecified complications: Secondary | ICD-10-CM

## 2021-10-22 ENCOUNTER — Telehealth: Payer: Self-pay | Admitting: Internal Medicine

## 2021-10-22 NOTE — Telephone Encounter (Signed)
Patient's spouse [name] requesting a call back to discuss a dual medical rx form  Spouse states pharmacy is waiting on signed return form to fill rx   Please call [phone]

## 2021-10-23 NOTE — Telephone Encounter (Signed)
Called pt, LVM.    I do not see that we have received a form.  Fax to clinical fax line [phone].

## 2021-11-04 ENCOUNTER — Telehealth: Payer: PPO

## 2021-11-04 ENCOUNTER — Ambulatory Visit (INDEPENDENT_AMBULATORY_CARE_PROVIDER_SITE_OTHER): Payer: PPO | Admitting: *Deleted

## 2021-11-04 DIAGNOSIS — R4181 Age-related cognitive decline: Secondary | ICD-10-CM

## 2021-11-04 DIAGNOSIS — E118 Type 2 diabetes mellitus with unspecified complications: Secondary | ICD-10-CM

## 2021-11-04 DIAGNOSIS — I2510 Atherosclerotic heart disease of native coronary artery without angina pectoris: Secondary | ICD-10-CM

## 2021-11-04 DIAGNOSIS — H5440 Blindness, one eye, unspecified eye: Secondary | ICD-10-CM

## 2021-11-04 NOTE — Chronic Care Management (AMB) (Signed)
  Care Management   Follow Up Note   11/04/2021 Name: [name] MRN: [id number] DOB: 1951-02-25   Referred by: [name], MD Reason for referral : Chronic Care Management (Case closure)   Third unsuccessful telephone outreach was attempted today. The patient was referred to the case management team for assistance with care management and care coordination. The patient's primary care provider has been notified of our unsuccessful attempts to make or maintain contact with the patient. The care management team is pleased to engage with this patient at any time in the future should he/she be interested in assistance from the care management team.   Follow Up Plan: If patient returns call to provider office, please advise to call back at [phone].  Eduard Clos MSW, LCSW Licensed Clinical Social Worker [place] [phone]

## 2021-11-08 ENCOUNTER — Other Ambulatory Visit: Payer: Self-pay | Admitting: Internal Medicine

## 2021-11-08 DIAGNOSIS — I5042 Chronic combined systolic (congestive) and diastolic (congestive) heart failure: Secondary | ICD-10-CM

## 2021-11-08 DIAGNOSIS — E118 Type 2 diabetes mellitus with unspecified complications: Secondary | ICD-10-CM

## 2021-11-08 DIAGNOSIS — Z794 Long term (current) use of insulin: Secondary | ICD-10-CM

## 2021-11-20 DIAGNOSIS — E118 Type 2 diabetes mellitus with unspecified complications: Secondary | ICD-10-CM

## 2021-11-20 DIAGNOSIS — I2510 Atherosclerotic heart disease of native coronary artery without angina pectoris: Secondary | ICD-10-CM

## 2021-11-20 DIAGNOSIS — R4181 Age-related cognitive decline: Secondary | ICD-10-CM

## 2021-11-28 ENCOUNTER — Other Ambulatory Visit: Payer: Self-pay | Admitting: Cardiology

## 2021-11-28 ENCOUNTER — Other Ambulatory Visit: Payer: Self-pay | Admitting: Internal Medicine

## 2021-11-28 ENCOUNTER — Other Ambulatory Visit: Payer: Self-pay | Admitting: Specialist

## 2021-11-28 DIAGNOSIS — E119 Type 2 diabetes mellitus without complications: Secondary | ICD-10-CM

## 2021-11-28 DIAGNOSIS — E781 Pure hyperglyceridemia: Secondary | ICD-10-CM

## 2021-11-28 DIAGNOSIS — I2510 Atherosclerotic heart disease of native coronary artery without angina pectoris: Secondary | ICD-10-CM

## 2021-11-28 DIAGNOSIS — E118 Type 2 diabetes mellitus with unspecified complications: Secondary | ICD-10-CM

## 2021-11-28 DIAGNOSIS — Z794 Long term (current) use of insulin: Secondary | ICD-10-CM

## 2021-12-04 ENCOUNTER — Encounter: Payer: Self-pay | Admitting: Internal Medicine

## 2021-12-04 ENCOUNTER — Ambulatory Visit (INDEPENDENT_AMBULATORY_CARE_PROVIDER_SITE_OTHER): Payer: PPO | Admitting: Internal Medicine

## 2021-12-04 VITALS — BP 136/80 | HR 67 | Temp 98.2°F | Ht 70.0 in | Wt 224.0 lb

## 2021-12-04 DIAGNOSIS — D510 Vitamin B12 deficiency anemia due to intrinsic factor deficiency: Secondary | ICD-10-CM | POA: Diagnosis not present

## 2021-12-04 DIAGNOSIS — I5042 Chronic combined systolic (congestive) and diastolic (congestive) heart failure: Secondary | ICD-10-CM | POA: Diagnosis not present

## 2021-12-04 DIAGNOSIS — E118 Type 2 diabetes mellitus with unspecified complications: Secondary | ICD-10-CM | POA: Diagnosis not present

## 2021-12-04 DIAGNOSIS — I2510 Atherosclerotic heart disease of native coronary artery without angina pectoris: Secondary | ICD-10-CM

## 2021-12-04 DIAGNOSIS — I10 Essential (primary) hypertension: Secondary | ICD-10-CM | POA: Diagnosis not present

## 2021-12-04 DIAGNOSIS — N182 Chronic kidney disease, stage 2 (mild): Secondary | ICD-10-CM

## 2021-12-04 DIAGNOSIS — D638 Anemia in other chronic diseases classified elsewhere: Secondary | ICD-10-CM | POA: Diagnosis not present

## 2021-12-04 DIAGNOSIS — Z23 Encounter for immunization: Secondary | ICD-10-CM

## 2021-12-04 LAB — BASIC METABOLIC PANEL
BUN: 17 mg/dL (ref 6–23)
CO2: 27 mEq/L (ref 19–32)
Calcium: 9.2 mg/dL (ref 8.4–10.5)
Chloride: 102 mEq/L (ref 96–112)
Creatinine, Ser: 1.31 mg/dL (ref 0.40–1.50)
GFR: 54.46 mL/min — ABNORMAL LOW (ref >60.00)
Glucose, Bld: 81 mg/dL (ref 70–99)
Potassium: 4.3 mEq/L (ref 3.5–5.1)
Sodium: 136 mEq/L (ref 135–145)

## 2021-12-04 LAB — CBC WITH DIFFERENTIAL/PLATELET
Basophils Absolute: 0 10*3/uL (ref 0.0–0.1)
Basophils Relative: 0.5 % (ref 0.0–3.0)
Eosinophils Absolute: 0.2 10*3/uL (ref 0.0–0.7)
Eosinophils Relative: 3.1 % (ref 0.0–5.0)
HCT: 38.6 % — ABNORMAL LOW (ref 39.0–52.0)
Hemoglobin: 12.2 g/dL — ABNORMAL LOW (ref 13.0–17.0)
Lymphocytes Relative: 38.2 % (ref 12.0–46.0)
Lymphs Abs: 2 10*3/uL (ref 0.7–4.0)
MCHC: 31.6 g/dL (ref 30.0–36.0)
MCV: 70.7 fl — ABNORMAL LOW (ref 78.0–100.0)
Monocytes Absolute: 0.4 10*3/uL (ref 0.1–1.0)
Monocytes Relative: 7.6 % (ref 3.0–12.0)
Neutro Abs: 2.6 10*3/uL (ref 1.4–7.7)
Neutrophils Relative %: 50.6 % (ref 43.0–77.0)
Platelets: 251 10*3/uL (ref 150.0–400.0)
RBC: 5.47 Mil/uL (ref 4.22–5.81)
RDW: 17.4 % — ABNORMAL HIGH (ref 11.5–15.5)
WBC: 5.2 10*3/uL (ref 4.0–10.5)

## 2021-12-04 LAB — HEMOGLOBIN A1C: Hgb A1c MFr Bld: 6.9 % — ABNORMAL HIGH (ref 4.6–6.5)

## 2021-12-04 MED ORDER — ASPIRIN 81 MG PO TBEC: 81.0000 mg | DELAYED_RELEASE_TABLET | Freq: Every day | ORAL | 1 refills | Status: DC

## 2021-12-04 NOTE — Patient Instructions (Signed)
Type 2 Diabetes Mellitus, Diagnosis, Adult ?Type 2 diabetes (type 2 diabetes mellitus) is a long-term, or chronic, disease. In type 2 diabetes, one or both of these problems may be present: ?The pancreas does not make enough of a hormone called insulin. ?Cells in the body do not respond properly to the insulin that the body makes (insulin resistance). ?Normally, insulin allows blood sugar (glucose) to enter cells in the body. The cells use glucose for energy. Insulin resistance or lack of insulin causes excess glucose to build up in the blood instead of going into cells. This causes high blood glucose (hyperglycemia).  ?What are the causes? ?The exact cause of type 2 diabetes is not known. ?What increases the risk? ?The following factors may make you more likely to develop this condition: ?Having a family member with type 2 diabetes. ?Being overweight or obese. ?Being inactive (sedentary). ?Having been diagnosed with insulin resistance. ?Having a history of prediabetes, diabetes when you were pregnant (gestational diabetes), or polycystic ovary syndrome (PCOS). ?What are the signs or symptoms? ?In the early stage of this condition, you may not have symptoms. Symptoms develop slowly and may include: ?Increased thirst or hunger. ?Increased urination. ?Unexplained weight loss. ?Tiredness (fatigue) or weakness. ?Vision changes, such as blurry vision. ?Dark patches on the skin. ?How is this diagnosed? ?This condition is diagnosed based on your symptoms, your medical history, a physical exam, and your blood glucose level. Your blood glucose may be checked with one or more of the following blood tests: ?A fasting blood glucose (FBG) test. You will not be allowed to eat (you will fast) for 8 hours or longer before a blood sample is taken. ?A random blood glucose test. This test checks blood glucose at any time of day regardless of when you ate. ?An A1C (hemoglobin A1C) blood test. This test provides information about blood  glucose levels over the previous 2-3 months. ?An oral glucose tolerance test (OGTT). This test measures your blood glucose at two times: ?After fasting. This is your baseline blood glucose level. ?Two hours after drinking a beverage that contains glucose. ?You may be diagnosed with type 2 diabetes if: ?Your fasting blood glucose level is 126 mg/dL (7.0 mmol/L) or higher. ?Your random blood glucose level is 200 mg/dL (11.1 mmol/L) or higher. ?Your A1C level is 6.5% or higher. ?Your oral glucose tolerance test result is higher than 200 mg/dL (11.1 mmol/L). ?These blood tests may be repeated to confirm your diagnosis. ?How is this treated? ?Your treatment may be managed by a specialist called an endocrinologist. Type 2 diabetes may be treated by following instructions from your health care provider about: ?Making dietary and lifestyle changes. These may include: ?Following a personalized nutrition plan that is developed by a registered dietitian. ?Exercising regularly. ?Finding ways to manage stress. ?Checking your blood glucose level as often as told. ?Taking diabetes medicines or insulin daily. This helps to keep your blood glucose levels in the healthy range. ?Taking medicines to help prevent complications from diabetes. Medicines may include: ?Aspirin. ?Medicine to lower cholesterol. ?Medicine to control blood pressure. ?Your health care provider will set treatment goals for you. Your goals will be based on your age, other medical conditions you have, and how you respond to diabetes treatment. Generally, the goal of treatment is to maintain the following blood glucose levels: ?Before meals: 80-130 mg/dL (4.4-7.2 mmol/L). ?After meals: below 180 mg/dL (10 mmol/L). ?A1C level: less than 7%. ?Follow these instructions at home: ?Questions to ask your health care provider ?  Consider asking the following questions: ?Should I meet with a certified diabetes care and education specialist? ?What diabetes medicines do I need,  and when should I take them? ?What equipment will I need to manage my diabetes at home? ?How often do I need to check my blood glucose? ?Where can I find a support group for people with diabetes? ?What number can I call if I have questions? ?When is my next appointment? ?General instructions ?Take over-the-counter and prescription medicines only as told by your health care provider. ?Keep all follow-up visits. This is important. ?Where to find more information ?For help and guidance and for more information about diabetes, please visit: ?American Diabetes Association (ADA): www.diabetes.org ?American Association of Diabetes Care and Education Specialists (ADCES): www.diabeteseducator.org ?International Diabetes Federation (IDF): www.idf.org ?Contact a health care provider if: ?Your blood glucose is at or above 240 mg/dL (13.3 mmol/L) for 2 days in a row. ?You have been sick or have had a fever for 2 days or longer, and you are not getting better. ?You have any of the following problems for more than 6 hours: ?You cannot eat or drink. ?You have nausea and vomiting. ?You have diarrhea. ?Get help right away if: ?You have severe hypoglycemia. This means your blood glucose is lower than 54 mg/dL (3.0 mmol/L). ?You become confused or you have trouble thinking clearly. ?You have difficulty breathing. ?You have moderate or large ketone levels in your urine. ?These symptoms may represent a serious problem that is an emergency. Do not wait to see if the symptoms will go away. Get medical help right away. Call your local emergency services (911 in the [place] Do not drive yourself to the hospital. ?Summary ?Type 2 diabetes mellitus is a long-term, or chronic, disease. In type 2 diabetes, the pancreas does not make enough of a hormone called insulin, or cells in the body do not respond properly to insulin that the body makes. ?This condition is treated by making dietary and lifestyle changes and taking diabetes medicines or  insulin. ?Your health care provider will set treatment goals for you. Your goals will be based on your age, other medical conditions you have, and how you respond to diabetes treatment. ?Keep all follow-up visits. This is important. ?This information is not intended to replace advice given to you by your health care provider. Make sure you discuss any questions you have with your health care provider. ?Document Revised: 03/04/2021 Document Reviewed: 03/04/2021 ?Elsevier Patient Education ? 2022 Elsevier Inc. ? ?

## 2021-12-04 NOTE — Progress Notes (Signed)
Subjective:  Patient ID: [name], male    DOB: May 02, 1951  Age: 70 y.o. MRN: [id number]  CC: Hypertension and Diabetes   This visit occurred during the SARS-CoV-2 public health emergency.  Safety protocols were in place, including screening questions prior to the visit, additional usage of staff PPE, and extensive cleaning of exam room while observing appropriate contact time as indicated for disinfecting solutions.    HPI [name] presents for f/up -  He has felt well recently and denies chest pain, shortness of breath, diaphoresis, dizziness, lightheadedness, or edema.  Outpatient Medications Prior to Visit  Medication Sig Dispense Refill   atorvastatin (LIPITOR) 80 MG tablet TAKE 1 TABLET BY MOUTH DAILY AT 6 PM. 90 tablet 1   carvedilol (COREG) 12.5 MG tablet TAKE 1 TABLET BY MOUTH TWICE A DAY WITH A MEAL. Please make yearly appt with Dr. Marlou Porch for December 2022 for future refills. Thank you 1st attempt 180 tablet 1   Cholecalciferol (VITAMIN D3) 25 MCG (1000 UT) CAPS Take by mouth.     Continuous Blood Gluc Receiver (FREESTYLE LIBRE 14 DAY READER) DEVI APPLY AS DIRECTED 2 each 5   Continuous Blood Gluc Sensor (FREESTYLE LIBRE 2 SENSOR) MISC USE 1 SENSOR EVERY 14 DAYS, THEN REPLACE WITH NEW ONE. 2 each 5   fluticasone (FLONASE) 50 MCG/ACT nasal spray Place 2 sprays into both nostrils daily. 48 mL 3   gabapentin (NEURONTIN) 300 MG capsule TAKE 1 CAPSULE BY MOUTH THREE TIMES A DAY 270 capsule 2   Glucagon (GVOKE HYPOPEN 2-PACK) 1 MG/0.2ML SOAJ Inject 1 Act into the skin daily as needed. 2 mL 5   indapamide (LOZOL) 1.25 MG tablet Take 1.25 mg by mouth daily.     Insulin Pen Needle (PEN NEEDLES) 32G X 4 MM MISC Use with insulin pen daily as directed 100 each 3   isosorbide mononitrate (IMDUR) 120 MG 24 hr tablet TAKE 1 TABLET BY MOUTH EVERY DAY 30 tablet 0   JARDIANCE 25 MG TABS tablet TAKE 1 TABLET (25 MG TOTAL) BY MOUTH DAILY. 90 tablet 0   levocetirizine (XYZAL) 5 MG tablet  TAKE 1 TABLET BY MOUTH EVERY DAY IN THE EVENING 90 tablet 3   metFORMIN (GLUCOPHAGE) 500 MG tablet TAKE 1 TABLET BY MOUTH 2 TIMES DAILY WITH A MEAL. 180 tablet 0   NEXLIZET 180-10 MG TABS TAKE 1 TABLET BY MOUTH DAILY 90 tablet 1   nitroGLYCERIN (NITROSTAT) 0.4 MG SL tablet SMARTSIG:1 Tablet(s) Sublingual PRN     omega-3 acid ethyl esters (LOVAZA) 1 g capsule TAKE 2 CAPSULES BY MOUTH 2 TIMES DAILY. 360 capsule 1   pantoprazole (PROTONIX) 40 MG tablet TAKE 1 TABLET BY MOUTH TWICE A DAY 180 tablet 3   ranolazine (RANEXA) 500 MG 12 hr tablet TAKE 1 TABLET BY MOUTH TWICE A DAY 180 tablet 0   SOLIQUA 100-33 UNT-MCG/ML SOPN INJECT 60 UNITS INTO THE SKIN DAILY 54 mL 0   tamsulosin (FLOMAX) 0.4 MG CAPS capsule Take 0.4 mg by mouth once.     traMADol (ULTRAM) 50 MG tablet TAKE 1 TABLET BY MOUTH EVERY 8 HOURS AS NEEDED 270 tablet 1   aspirin 81 MG EC tablet Take 1 tablet (81 mg total) by mouth daily. Swallow whole. 90 tablet 1   No facility-administered medications prior to visit.    ROS Review of Systems  Constitutional:  Positive for unexpected weight change (wt gain). Negative for diaphoresis and fatigue.  HENT: Negative.    Eyes: Negative.  Respiratory:  Negative for cough, chest tightness, shortness of breath and wheezing.   Cardiovascular:  Negative for chest pain, palpitations and leg swelling.  Gastrointestinal:  Negative for abdominal pain, diarrhea, nausea and vomiting.  Endocrine: Negative.  Negative for polydipsia, polyphagia and polyuria.  Genitourinary: Negative.  Negative for difficulty urinating.  Musculoskeletal: Negative.  Negative for arthralgias and joint swelling.  Skin: Negative.  Negative for color change.   Objective:  BP 136/80 (BP Location: Right Arm, Patient Position: Sitting, Cuff Size: Large)    Pulse 67    Temp 98.2 F (36.8 C) (Oral)    Ht 5\' 10"  (1.778 m)    Wt 224 lb (101.6 kg)    SpO2 96%    BMI 32.14 kg/m   BP Readings from Last 3 Encounters:  12/04/21  136/80  06/19/21 138/76  01/29/21 (!) 143/62    Wt Readings from Last 3 Encounters:  12/04/21 224 lb (101.6 kg)  06/19/21 218 lb (98.9 kg)  01/29/21 221 lb (100.2 kg)    Physical Exam Vitals reviewed.  HENT:     Nose: Nose normal.     Mouth/Throat:     Mouth: Mucous membranes are moist.  Eyes:     General: No scleral icterus.    Conjunctiva/sclera: Conjunctivae normal.  Cardiovascular:     Rate and Rhythm: Normal rate and regular rhythm.     Heart sounds: No murmur heard.    Comments: SR with 1st degree  AV block LAD - old Low voltage - c/w body unchanged Pulmonary:     Breath sounds: No stridor. No wheezing, rhonchi or rales.  Abdominal:     General: Abdomen is flat.     Palpations: There is no mass.     Tenderness: There is no abdominal tenderness. There is no guarding.     Hernia: No hernia is present.  Musculoskeletal:     Cervical back: Neck supple.     Right lower leg: No edema.     Left lower leg: No edema.  Lymphadenopathy:     Cervical: No cervical adenopathy.  Skin:    General: Skin is warm and dry.  Neurological:     General: No focal deficit present.     Mental Status: He is alert.  Psychiatric:        Mood and Affect: Mood normal.        Behavior: Behavior normal.    Lab Results  Component Value Date   WBC 5.2 12/04/2021   HGB 12.2 (L) 12/04/2021   HCT 38.6 (L) 12/04/2021   PLT 251.0 12/04/2021   GLUCOSE 81 12/04/2021   CHOL 101 06/19/2021   TRIG 61.0 06/19/2021   HDL 28.90 (L) 06/19/2021   LDLDIRECT 82.7 04/04/2013   LDLCALC 60 06/19/2021   ALT 15 06/19/2021   AST 18 06/19/2021   NA 136 12/04/2021   K 4.3 12/04/2021   CL 102 12/04/2021   CREATININE 1.31 12/04/2021   BUN 17 12/04/2021   CO2 27 12/04/2021   TSH 1.42 06/19/2021   PSA 2.51 01/02/2021   INR 0.95 08/17/2010   HGBA1C 6.9 (H) 12/04/2021   MICROALBUR 1.8 06/19/2021    VAS [place] LOWER EXTREMITY VENOUS (DVT)  Result Date: 07/23/2018  Lower Venous DVT Study Indications:  Edema, bilaterally. Denies SOB.  Risk Factors: None identified. Performing Technologist: Sharlett Iles RVT  Examination Guidelines: A complete evaluation includes B-mode imaging, spectral Doppler, color Doppler, and power Doppler as needed of all accessible portions of each vessel. Bilateral  testing is considered an integral part of a complete examination. Limited examinations for reoccurring indications may be performed as noted. The reflux portion of the exam is performed with the patient in reverse Trendelenburg.  Right Venous Findings: +---------+---------------+---------+-----------+----------+-------+            Compressibility Phasicity Spontaneity Properties Summary  +---------+---------------+---------+-----------+----------+-------+  CFV       Full            Yes       Yes                             +---------+---------------+---------+-----------+----------+-------+  SFJ       Full            Yes       Yes                             +---------+---------------+---------+-----------+----------+-------+  FV Prox   Full            Yes       Yes                             +---------+---------------+---------+-----------+----------+-------+  FV Mid    Full                                                      +---------+---------------+---------+-----------+----------+-------+  FV Distal Full            Yes       Yes                             +---------+---------------+---------+-----------+----------+-------+  PFV       Full            Yes       Yes                             +---------+---------------+---------+-----------+----------+-------+  POP       Full            Yes       Yes                             +---------+---------------+---------+-----------+----------+-------+  PTV       Full            Yes       No                              +---------+---------------+---------+-----------+----------+-------+  PERO      Full            Yes       No                               +---------+---------------+---------+-----------+----------+-------+  Gastroc   Full                                                      +---------+---------------+---------+-----------+----------+-------+  GSV       Full            Yes       Yes                             +---------+---------------+---------+-----------+----------+-------+  Left Venous Findings: +---------+---------------+---------+-----------+----------+-------+            Compressibility Phasicity Spontaneity Properties Summary  +---------+---------------+---------+-----------+----------+-------+  CFV       Full            Yes       Yes                             +---------+---------------+---------+-----------+----------+-------+  SFJ       Full            Yes       Yes                             +---------+---------------+---------+-----------+----------+-------+  FV Prox   Full            Yes       Yes                             +---------+---------------+---------+-----------+----------+-------+  FV Mid    Full                                                      +---------+---------------+---------+-----------+----------+-------+  FV Distal Full            Yes       Yes                             +---------+---------------+---------+-----------+----------+-------+  PFV       Full            Yes       Yes                             +---------+---------------+---------+-----------+----------+-------+  POP       Full            Yes       Yes                             +---------+---------------+---------+-----------+----------+-------+  PTV       Full            Yes       No                              +---------+---------------+---------+-----------+----------+-------+  PERO      Full            Yes       No                              +---------+---------------+---------+-----------+----------+-------+  Gastroc   Full                                                       +---------+---------------+---------+-----------+----------+-------+  GSV       Full            Yes       Yes                             +---------+---------------+---------+-----------+----------+-------+    Final Interpretation: Right: No evidence of deep vein thrombosis in the lower extremity. No indirect evidence of obstruction proximal to the inguinal ligament. Left: No evidence of deep vein thrombosis in the lower extremity. No indirect evidence of obstruction proximal to the inguinal ligament.  *See table(s) above for measurements and observations. Electronically signed by Ida Rogue MD on 07/23/2018 at 7:37:05 PM.    Final     Assessment & Plan:   [name] was seen today for hypertension and diabetes.  Diagnoses and all orders for this visit:  Anemia, chronic disease- His H&H are stable. -     CBC with Differential/Platelet; Future -     CBC with Differential/Platelet  Type 2 diabetes mellitus with complication ([place]- His blood sugar is adequately well controlled. 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 -     CBC with Differential/Platelet; Future -     CBC with Differential/Platelet  Primary hypertension- His blood pressure is well controlled and his EKG is reassuring. -     EKG 12-Lead -     Urinalysis, Routine w reflex microscopic; Future -     Urinalysis, Routine w reflex microscopic  Chronic combined systolic and diastolic congestive heart failure ([place])- He has a normal volume status.  Flu vaccine need -     Flu Vaccine QUAD High Dose(Fluad)  Atherosclerosis of native coronary artery without angina pectoris, unspecified whether native or transplanted heart  Atherosclerosis of native coronary artery of native heart without angina pectoris -     aspirin 81 MG EC tablet; Take 1 tablet  (81 mg total) by mouth daily. Swallow whole.  Coronary artery disease involving native coronary artery of native heart without angina pectoris -     aspirin 81 MG EC tablet; Take 1 tablet (81 mg total) by mouth daily. Swallow whole.  CKD (chronic kidney disease) stage 2, GFR 60-89 ml/min- His renal function is stable.   I am having [name] maintain his Vitamin D3, tamsulosin, pantoprazole, FreeStyle Libre 14 Day Reader, indapamide, nitroGLYCERIN, Gvoke HypoPen 2-Pack, fluticasone, carvedilol, traMADol, Pen Needles, Nexlizet, atorvastatin, levocetirizine, FreeStyle Libre 2 Sensor, Jardiance, isosorbide mononitrate, gabapentin, ranolazine, metFORMIN, omega-3 acid ethyl esters, Soliqua, and aspirin.  Meds ordered this encounter  Medications   aspirin 81 MG EC tablet    Sig: Take 1 tablet (81 mg total) by mouth daily. Swallow whole.    Dispense:  90 tablet    Refill:  1    DX Code Needed  .      Follow-up: No follow-ups on file.  [name], MD

## 2021-12-28 ENCOUNTER — Other Ambulatory Visit: Payer: Self-pay | Admitting: Cardiology

## 2021-12-28 ENCOUNTER — Other Ambulatory Visit: Payer: Self-pay | Admitting: Internal Medicine

## 2021-12-28 DIAGNOSIS — I5042 Chronic combined systolic (congestive) and diastolic (congestive) heart failure: Secondary | ICD-10-CM

## 2021-12-28 DIAGNOSIS — E118 Type 2 diabetes mellitus with unspecified complications: Secondary | ICD-10-CM

## 2022-01-06 ENCOUNTER — Other Ambulatory Visit: Payer: Self-pay | Admitting: Internal Medicine

## 2022-01-06 DIAGNOSIS — I2510 Atherosclerotic heart disease of native coronary artery without angina pectoris: Secondary | ICD-10-CM

## 2022-01-06 DIAGNOSIS — E118 Type 2 diabetes mellitus with unspecified complications: Secondary | ICD-10-CM

## 2022-01-13 ENCOUNTER — Other Ambulatory Visit: Payer: Self-pay | Admitting: Internal Medicine

## 2022-01-13 DIAGNOSIS — E119 Type 2 diabetes mellitus without complications: Secondary | ICD-10-CM

## 2022-01-13 DIAGNOSIS — E118 Type 2 diabetes mellitus with unspecified complications: Secondary | ICD-10-CM

## 2022-01-15 DIAGNOSIS — G4733 Obstructive sleep apnea (adult) (pediatric): Secondary | ICD-10-CM | POA: Diagnosis not present

## 2022-01-20 ENCOUNTER — Telehealth: Payer: PPO

## 2022-01-22 ENCOUNTER — Telehealth: Payer: PPO

## 2022-02-07 DIAGNOSIS — Z4420 Encounter for fitting and adjustment of artificial eye, unspecified: Secondary | ICD-10-CM | POA: Diagnosis not present

## 2022-02-17 ENCOUNTER — Other Ambulatory Visit: Payer: Self-pay | Admitting: Cardiology

## 2022-02-27 ENCOUNTER — Other Ambulatory Visit: Payer: Self-pay | Admitting: Internal Medicine

## 2022-02-27 DIAGNOSIS — I5042 Chronic combined systolic (congestive) and diastolic (congestive) heart failure: Secondary | ICD-10-CM

## 2022-02-27 DIAGNOSIS — E118 Type 2 diabetes mellitus with unspecified complications: Secondary | ICD-10-CM

## 2022-02-27 LAB — HM DIABETES EYE EXAM

## 2022-02-28 ENCOUNTER — Other Ambulatory Visit: Payer: Self-pay

## 2022-02-28 MED ORDER — NITROGLYCERIN 0.4 MG SL SUBL: 0.4000 mg | SUBLINGUAL_TABLET | SUBLINGUAL | 0 refills | Status: DC | PRN

## 2022-03-01 ENCOUNTER — Other Ambulatory Visit: Payer: Self-pay | Admitting: Cardiology

## 2022-03-05 ENCOUNTER — Ambulatory Visit: Payer: PPO

## 2022-03-05 ENCOUNTER — Other Ambulatory Visit: Payer: Self-pay | Admitting: Internal Medicine

## 2022-03-05 DIAGNOSIS — M961 Postlaminectomy syndrome, not elsewhere classified: Secondary | ICD-10-CM

## 2022-03-05 DIAGNOSIS — N401 Enlarged prostate with lower urinary tract symptoms: Secondary | ICD-10-CM | POA: Diagnosis not present

## 2022-03-05 DIAGNOSIS — R972 Elevated prostate specific antigen [PSA]: Secondary | ICD-10-CM | POA: Diagnosis not present

## 2022-03-05 DIAGNOSIS — M5416 Radiculopathy, lumbar region: Secondary | ICD-10-CM

## 2022-03-05 DIAGNOSIS — R3912 Poor urinary stream: Secondary | ICD-10-CM | POA: Diagnosis not present

## 2022-03-05 DIAGNOSIS — E0849 Diabetes mellitus due to underlying condition with other diabetic neurological complication: Secondary | ICD-10-CM

## 2022-03-07 ENCOUNTER — Encounter: Payer: Self-pay | Admitting: Podiatry

## 2022-03-07 ENCOUNTER — Ambulatory Visit (INDEPENDENT_AMBULATORY_CARE_PROVIDER_SITE_OTHER): Payer: PPO | Admitting: Podiatry

## 2022-03-07 ENCOUNTER — Other Ambulatory Visit: Payer: Self-pay

## 2022-03-07 DIAGNOSIS — M79609 Pain in unspecified limb: Secondary | ICD-10-CM

## 2022-03-07 DIAGNOSIS — B351 Tinea unguium: Secondary | ICD-10-CM | POA: Diagnosis not present

## 2022-03-07 DIAGNOSIS — E1142 Type 2 diabetes mellitus with diabetic polyneuropathy: Secondary | ICD-10-CM | POA: Diagnosis not present

## 2022-03-11 ENCOUNTER — Ambulatory Visit (INDEPENDENT_AMBULATORY_CARE_PROVIDER_SITE_OTHER): Payer: PPO

## 2022-03-11 ENCOUNTER — Other Ambulatory Visit: Payer: Self-pay | Admitting: Internal Medicine

## 2022-03-11 ENCOUNTER — Ambulatory Visit: Payer: PPO

## 2022-03-11 DIAGNOSIS — K219 Gastro-esophageal reflux disease without esophagitis: Secondary | ICD-10-CM

## 2022-03-11 DIAGNOSIS — M961 Postlaminectomy syndrome, not elsewhere classified: Secondary | ICD-10-CM

## 2022-03-11 DIAGNOSIS — E785 Hyperlipidemia, unspecified: Secondary | ICD-10-CM

## 2022-03-11 DIAGNOSIS — M5416 Radiculopathy, lumbar region: Secondary | ICD-10-CM

## 2022-03-11 DIAGNOSIS — I5042 Chronic combined systolic (congestive) and diastolic (congestive) heart failure: Secondary | ICD-10-CM

## 2022-03-11 DIAGNOSIS — E118 Type 2 diabetes mellitus with unspecified complications: Secondary | ICD-10-CM

## 2022-03-11 DIAGNOSIS — I2510 Atherosclerotic heart disease of native coronary artery without angina pectoris: Secondary | ICD-10-CM

## 2022-03-11 DIAGNOSIS — E0849 Diabetes mellitus due to underlying condition with other diabetic neurological complication: Secondary | ICD-10-CM

## 2022-03-11 DIAGNOSIS — N182 Chronic kidney disease, stage 2 (mild): Secondary | ICD-10-CM

## 2022-03-11 MED ORDER — RANOLAZINE ER 500 MG PO TB12: 500.0000 mg | ORAL_TABLET | Freq: Two times a day (BID) | ORAL | 1 refills | Status: DC

## 2022-03-11 MED ORDER — ATORVASTATIN CALCIUM 80 MG PO TABS
ORAL_TABLET | ORAL | 1 refills | Status: DC
Start: 2022-03-11 — End: 2022-12-03

## 2022-03-11 MED ORDER — ISOSORBIDE MONONITRATE ER 120 MG PO TB24: 120.0000 mg | ORAL_TABLET | Freq: Every day | ORAL | 1 refills | Status: DC

## 2022-03-11 MED ORDER — EMPAGLIFLOZIN 25 MG PO TABS: 25.0000 mg | ORAL_TABLET | Freq: Every day | ORAL | 1 refills | Status: DC

## 2022-03-11 MED ORDER — METFORMIN HCL 500 MG PO TABS: 500.0000 mg | ORAL_TABLET | Freq: Two times a day (BID) | ORAL | 1 refills | Status: DC

## 2022-03-11 MED ORDER — PANTOPRAZOLE SODIUM 40 MG PO TBEC: 40.0000 mg | DELAYED_RELEASE_TABLET | Freq: Two times a day (BID) | ORAL | 1 refills | Status: DC

## 2022-03-11 MED ORDER — GLUCAGON 1 MG/0.2ML ~~LOC~~ SOAJ: 1.0000 | Freq: Once | SUBCUTANEOUS | 3 refills | Status: AC | PRN

## 2022-03-11 MED ORDER — FREESTYLE LIBRE 2 SENSOR MISC: 5 refills | Status: DC

## 2022-03-11 NOTE — Progress Notes (Signed)
? ?Chronic Care Management ?Pharmacy Note ? ?03/11/2022 ?Name:  [name] MRN:  [id number] DOB:  1951-09-23 ? ?Summary: ?-Patient reports compliance to current medications, denies any issues with current medications  ?-Patient reports that BG can be in the 70-90's prior to bed- typically correcting with orange juice / honey as needed - notes that at times he is not eating as much and this is typically when he has a low  ? ?Recommendations/Changes made from today's visit: ?-Discussed with patient about reducing soliqua to prevent low blood sugars which he declined, reports that he prefers to try being more consistent with his diet and not skipping meals prior to bedtime to prevent low blood sugars prior to bed ?-Patient reports gvoke pen not covered by insurance, will see about glucagon pen for hypoglycemia  ?-Patient will reach out should he have issues with hypoglycemia to discuss changes to DM medications to prevent low blood sugars (possible could trial reduction/ hold of metformin) ? ?Subjective: ?[name] is an 71 y.o. year old male who is a primary patient of [name], MD.  The CCM team was consulted for assistance with disease management and care coordination needs.   ? ?Engaged with patient by telephone for follow up visit in response to provider referral for pharmacy case management and/or care coordination services.  ? ?Consent to Services:  ?The patient was given information about Chronic Care Management services, agreed to services, and gave verbal consent prior to initiation of services.  Please see initial visit note for detailed documentation.  ? ?Patient Care Team: ?[name], MD as PCP - General ?Jerline Pain, MD as PCP - Cardiology (Cardiology) ?Star Age, MD as Attending Physician (Neurology) ?Gardiner Barefoot, DPM as Consulting Physician (Podiatry) ?Tomasa Blase, [facility] (Pharmacist) ? ? ?Patient lives at home with his wife. He is legally blind. His wife just started a new  job working from home, starts at [employer]  each day. They are interested in getting an in-home aid a few hours a day to help him with ADLs, even if they have to pay for it. ? ?Recent office visits: ?12/04/2021 - [name]  - no changes to medications  ? ?Recent consult visits: ?03/07/2022 - Dr. Elisha Ponder - Podiatry - note not available at this time  ? ?Hospital visits: ?None in previous 6 months ? ?Objective: ? ?Lab Results  ?Component Value Date  ? CREATININE 1.31 12/04/2021  ? BUN 17 12/04/2021  ? GFR 54.46 (L) 12/04/2021  ? GFRNONAA >60 07/09/2017  ? GFRAA >60 07/09/2017  ? NA 136 12/04/2021  ? K 4.3 12/04/2021  ? CALCIUM 9.2 12/04/2021  ? CO2 27 12/04/2021  ? GLUCOSE 81 12/04/2021  ? ? ?Lab Results  ?Component Value Date/Time  ? HGBA1C 6.9 (H) 12/04/2021 10:05 AM  ? HGBA1C 7.9 (H) 06/19/2021 09:39 AM  ? GFR 54.46 (L) 12/04/2021 10:05 AM  ? GFR 61.94 06/19/2021 09:39 AM  ? MICROALBUR 1.8 06/19/2021 09:39 AM  ? MICROALBUR 0.5 02/08/2020 04:45 PM  ?  ?Last diabetic Eye exam:  ?Lab Results  ?Component Value Date/Time  ? HMDIABEYEEXA Retinopathy (A) 07/03/2020 12:00 AM  ?  ?Last diabetic Foot exam:  ?Lab Results  ?Component Value Date/Time  ? HMDIABFOOTEX normal 04/04/2013 12:00 AM  ?  ? ?Lab Results  ?Component Value Date  ? CHOL 101 06/19/2021  ? HDL 28.90 (L) 06/19/2021  ? [place] 60 06/19/2021  ? LDLDIRECT 82.7 04/04/2013  ? TRIG 61.0 06/19/2021  ? CHOLHDL 3  06/19/2021  ? ? ?Hepatic Function Latest Ref Rng & Units 06/19/2021 02/24/2019 11/24/2017  ?Total Protein 6.0 - 8.3 g/dL 7.2 7.4 7.0  ?Albumin 3.5 - 5.2 g/dL 4.1 4.2 4.1  ?AST 0 - 37 U/L _0 ?ALT 0 - 53 U/L _1 ?Alk Phosphatase 39 - 117 U/L 40 53 53  ?Total Bilirubin 0.2 - 1.2 mg/dL 0.5 0.6 0.8  ?Bilirubin, Direct 0.0 - 0.3 mg/dL 0.1 - -  ? ? ?Lab Results  ?Component Value Date/Time  ? TSH 1.42 06/19/2021 09:39 AM  ? TSH 1.15 02/24/2019 03:56 PM  ? ? ?CBC Latest Ref Rng & Units 12/04/2021 06/19/2021 11/08/2020  ?WBC 4.0 - 10.5 K/uL 5.2 5.1 5.0  ?Hemoglobin  13.0 - 17.0 g/dL 12.2(L) 12.1(L) 12.5(L)  ?Hematocrit 39.0 - 52.0 % 38.6(L) 37.7(L) 39.1  ?Platelets 150.0 - 400.0 K/uL 251.0 239.0 244.0  ? ? ?No results found for: VD25OH ? ?Clinical ASCVD: Yes  ?The ASCVD Risk score (Arnett DK, et al., 2019) failed to calculate for the following reasons: ?  The valid total cholesterol range is 130 to 320 mg/dL   ? ?Depression screen [facility] 2/9 03/04/2021 11/08/2020 07/13/2019  ?Decreased Interest 0 0 1  ?Down, Depressed, Hopeless 0 0 1  ?PHQ - 2 Score 0 0 2  ?Altered sleeping - - -  ?Tired, decreased energy - - -  ?Change in appetite - - -  ?Feeling bad or failure about yourself  - - -  ?Trouble concentrating - - -  ?Moving slowly or fidgety/restless - - -  ?Suicidal thoughts - - -  ?PHQ-9 Score - - -  ?Difficult doing work/chores - - -  ?Some recent data might be hidden  ?  ? ? ?Social History  ? ?Tobacco Use  ?Smoking Status Never  ?Smokeless Tobacco Never  ? ?BP Readings from Last 3 Encounters:  ?12/04/21 136/80  ?06/19/21 138/76  ?01/29/21 (!) 143/62  ? ?Pulse Readings from Last 3 Encounters:  ?12/04/21 67  ?06/19/21 65  ?01/29/21 70  ? ?Wt Readings from Last 3 Encounters:  ?12/04/21 224 lb (101.6 kg)  ?06/19/21 218 lb (98.9 kg)  ?01/29/21 221 lb (100.2 kg)  ? ?BMI Readings from Last 3 Encounters:  ?12/04/21 32.14 kg/m?  ?06/19/21 31.28 kg/m?  ?01/29/21 31.71 kg/m?  ? ? ?Assessment/Interventions: Review of patient past medical history, allergies, medications, health status, including review of consultants reports, laboratory and other test data, was performed as part of comprehensive evaluation and provision of chronic care management services.  ? ?SDOH:  (Social Determinants of Health) assessments and interventions performed: Yes ? ?SDOH Screenings  ? ?Alcohol Screen: Not on file  ?Depression (PHQ2-9): Not on file  ?Financial Resource Strain: Not on file  ?Food Insecurity: Not on file  ?Housing: Not on file  ?Physical Activity: Not on file  ?Social Connections: Not on file   ?Stress: Not on file  ?Tobacco Use: Low Risk   ? Smoking Tobacco Use: Never  ? Smokeless Tobacco Use: Never  ? Passive Exposure: Not on file  ?Transportation Needs: Not on file  ? ? ?[place] ? ?Allergies  ?Allergen Reactions  ? Furosemide   ?  pancreatitis  ? Olmesartan Other (See Comments)  ?  hyperkalemia  ? Amlodipine Swelling  ?  LE EDEMA  ? Diamox [Acetazolamide] Itching  ?  hives  ? Lisinopril Rash  ? ? ?Medications Reviewed Today   ? ? Reviewed by Tomasa Blase, [facility] (Pharmacist) on 03/11/22  at [place] List Status: <None>  ? ?Medication Order Taking? Sig Documenting Provider Last Dose Status Informant  ?aspirin 81 MG EC tablet [id number] Yes Take 1 tablet (81 mg total) by mouth daily. Swallow whole. [name], MD Taking Active   ?atorvastatin (LIPITOR) 80 MG tablet [id number] Yes TAKE 1 TABLET BY MOUTH DAILY AT 6 PM. [name], MD Taking Active   ?carvedilol (COREG) 12.5 MG tablet [id number] Yes TAKE 1 TABLET BY MOUTH TWICE A DAY WITH A MEAL. Please make yearly appt with Dr. Marlou Porch for December 2022 for future refills. Thank you 1st attempt Jerline Pain, MD Taking Active   ?Cholecalciferol (VITAMIN D3) 25 MCG (1000 UT) CAPS [id number] Yes Take by mouth. [provider] Taking Active   ?         ?Med Note Domenic Polite Dec 03, 2020  8:29 AM)    ?Continuous Blood Gluc Receiver (FREESTYLE LIBRE 14 DAY READER) DEVI [id number] Yes APPLY AS DIRECTED [name], MD Taking Active   ?Continuous Blood Gluc Sensor (FREESTYLE LIBRE 2 SENSOR) MISC [id number] Yes USE 1 SENSOR EVERY 14 DAYS, THEN REPLACE WITH NEW ONE. [name], MD Taking Active   ?fluticasone [facility]) 50 MCG/ACT nasal spray [id number] Yes Place 2 sprays into both nostrils daily. [name], MD Taking Active   ?gabapentin (NEURONTIN) 300 MG capsule [id number] Yes TAKE 1 CAPSULE BY MOUTH THREE TIMES A DAY Jessy Oto, MD Taking Active   ?Glucagon (GVOKE HYPOPEN 2-PACK) 1 MG/0.2ML SOAJ [id number] No Inject  1 Act into the skin daily as needed.  ?Patient not taking: Reported on 03/11/2022  ? [name], MD Not Taking Active   ?indapamide (LOZOL) 1.25 MG tablet [id number] Yes Take 1.25 mg by mouth daily. Provid

## 2022-03-11 NOTE — Patient Instructions (Signed)
Visit Information ? ?Following are the goals we discussed today:  ? ?Manage My Medicine  ? ?Timeframe:  Long-Range Goal ?Priority:  Medium ?Start Date:    05/09/21                         ?Expected End Date:    07/22/22             ? ?Follow Up Date June 2023 ?  ?- call for medicine refill 2 or 3 days before it runs out ?- call if I am sick and can't take my medicine ?- keep a list of all the medicines I take; vitamins and herbals too ? ?  ?Why is this important?   ?These steps will help you keep on track with your medicines. ? ?Plan: Telephone follow up appointment with care management team member scheduled for:  3 months  ?The patient has been provided with contact information for the care management team and has been advised to call with any health related questions or concerns.  ? ?Tomasa Blase, PharmD ?Clinical Pharmacist, [place]  ? ?Please call the care guide team at [phone] if you need to cancel or reschedule your appointment.  ? ?Patient verbalizes understanding of instructions and care plan provided today and agrees to view in [place]. Active MyChart status confirmed with patient.   ? ?

## 2022-03-16 NOTE — Progress Notes (Signed)
?  Subjective:  ?Patient ID: [name], male    DOB: September 02, 1951,  MRN: [id number] ? ?[name] presents to clinic today for at risk foot care with history of diabetic neuropathy and painful elongated mycotic toenails 1-5 bilaterally which are tender when wearing enclosed shoe gear. Pain is relieved with periodic professional debridement. ? ?Patient did not check blood glucose today. ? ?Patient is accompanied by his wife on today's visit. ? ?New problem(s): None.  ? ?PCP is [name], MD , and last visit was December 04, 2021. ? ?Allergies  ?Allergen Reactions  ? Furosemide   ?  pancreatitis  ? Olmesartan Other (See Comments)  ?  hyperkalemia  ? Amlodipine Swelling  ?  LE EDEMA  ? Diamox [Acetazolamide] Itching  ?  hives  ? Lisinopril Rash  ? ?Review of Systems: Negative except as noted in the HPI. ? ?Objective: No changes noted in today's physical examination. ?[name] is a pleasant 71 y.o. male in NAD. AAO X 3. ? ?Vascular Examination: ?Capillary fill time to digits <3 seconds b/l lower extremities. Faintly palpable DP pulse(s) b/l lower extremities. Nonpalpable PT pulse(s) b/l lower extremities. Pedal hair absent. Lower extremity skin temperature gradient within normal limits. No pain with calf compression b/l. No edema noted b/l lower extremities. ? ?Dermatological Examination: ?Pedal skin is thin shiny, atrophic b/l lower extremities. No open wounds b/l lower extremities No interdigital macerations b/l lower extremities Toenails 1-5 b/l elongated, discolored, dystrophic, thickened, crumbly with subungual debris and tenderness to dorsal palpation. ? ?Musculoskeletal Examination: ?Muscle strength 3/5 to all LE muscle groups of left lower extremity. Muscle strength 2/5 to all LE muscle groups of right lower extremity. No pain crepitus or joint limitation noted with ROM b/l. No gross bony deformities bilaterally. Wearing appropriate fitting shoe gear. Utilizes cane for ambulation  assistance. ? ?Neurological Examination: ?Pt has subjective symptoms of neuropathy. Protective sensation diminished with 10g monofilament b/l. Vibratory sensation diminished b/l. ? ? ?  Latest Ref Rng & Units 12/04/2021  ? 10:05 AM 06/19/2021  ?  9:39 AM  ?Hemoglobin A1C  ?Hemoglobin-A1c 4.6 - 6.5 % 6.9   7.9    ? ?Assessment/Plan: ?1. Pain due to onychomycosis of nail   ?2. Diabetic peripheral neuropathy associated with type 2 diabetes mellitus ([place])   ?  ?-Patient was evaluated and treated. All patient's and/or POA's questions/concerns answered on today's visit. ?-Mycotic toenails 1-5 bilaterally were debrided in length and girth with sterile nail nippers and dremel without incident. ?-Patient/POA to call should there be question/concern in the interim.  ? ?Return in about 3 months (around 06/07/2022). ? ?Marzetta Board, DPM  ?

## 2022-03-21 DIAGNOSIS — I5042 Chronic combined systolic (congestive) and diastolic (congestive) heart failure: Secondary | ICD-10-CM

## 2022-03-21 DIAGNOSIS — N182 Chronic kidney disease, stage 2 (mild): Secondary | ICD-10-CM

## 2022-03-21 DIAGNOSIS — E785 Hyperlipidemia, unspecified: Secondary | ICD-10-CM

## 2022-03-21 DIAGNOSIS — E118 Type 2 diabetes mellitus with unspecified complications: Secondary | ICD-10-CM | POA: Diagnosis not present

## 2022-04-21 ENCOUNTER — Other Ambulatory Visit: Payer: Self-pay | Admitting: Internal Medicine

## 2022-04-21 DIAGNOSIS — E785 Hyperlipidemia, unspecified: Secondary | ICD-10-CM

## 2022-04-21 DIAGNOSIS — I2510 Atherosclerotic heart disease of native coronary artery without angina pectoris: Secondary | ICD-10-CM

## 2022-05-17 ENCOUNTER — Other Ambulatory Visit: Payer: Self-pay | Admitting: Cardiology

## 2022-05-17 ENCOUNTER — Other Ambulatory Visit: Payer: Self-pay | Admitting: Internal Medicine

## 2022-05-17 DIAGNOSIS — E781 Pure hyperglyceridemia: Secondary | ICD-10-CM

## 2022-05-17 DIAGNOSIS — I5022 Chronic systolic (congestive) heart failure: Secondary | ICD-10-CM

## 2022-05-17 DIAGNOSIS — K219 Gastro-esophageal reflux disease without esophagitis: Secondary | ICD-10-CM

## 2022-06-03 ENCOUNTER — Other Ambulatory Visit: Payer: Self-pay | Admitting: Internal Medicine

## 2022-06-03 DIAGNOSIS — E0849 Diabetes mellitus due to underlying condition with other diabetic neurological complication: Secondary | ICD-10-CM

## 2022-06-03 DIAGNOSIS — M5416 Radiculopathy, lumbar region: Secondary | ICD-10-CM

## 2022-06-03 DIAGNOSIS — M961 Postlaminectomy syndrome, not elsewhere classified: Secondary | ICD-10-CM

## 2022-06-11 ENCOUNTER — Telehealth: Payer: PPO

## 2022-06-20 ENCOUNTER — Other Ambulatory Visit: Payer: Self-pay | Admitting: Cardiology

## 2022-06-20 DIAGNOSIS — I5022 Chronic systolic (congestive) heart failure: Secondary | ICD-10-CM

## 2022-06-23 NOTE — Progress Notes (Signed)
Cardiology Office Note:    Date:  06/25/2022   ID:  [name], DOB 1951/07/23, MRN [id number]  PCP:  Etta Grandchild, MD   [facility] Providers Cardiologist:  Donato Schultz, MD     Referring MD: Etta Grandchild, MD   Chief Complaint: overdue follow-up CAD  History of Present Illness:    WWILLIAM BISCARDI is a very pleasant 71 y.o. male with a hx of CAD with prior BMS stenting of LCx, diabetes, polyneuropathy, hypertension, hyperlipidemia, obesity, OSA, and blindness  Cardiac catheterization 2008 demonstrated circumflex disease with PCI with BMS.  Right coronary artery was managed medically and not favorable for PCI.  Low risk Myoview 2017 and again 04/2020.   His last office visit was via telemedicine with Norma Fredrickson, NP on 12/03/2020.  He had no specific concerns and there was no change to his medical regimen.  61-month follow-up was recommended.  Today, he is here for overdue follow-up. Reports DOE and chest burning when walking fast. Feels better when he sits and rests. Thinks it may have started 2-4 weeks ago. Recalls reporting to his wife that he had shortness of breath walking from bedroom to kitchen about 2 weeks ago.Feels like angina that occurred prior to cardiac catheterization. No edema, orthopnea, or PND.  Is not very active, limited by back pain and blindness. No palpitations, presyncope, syncope. Has not had recent follow-up due to he and his wife are without a vehicle for some time.  Recently purchased a vehicle.   Past Medical History:  Diagnosis Date   Arthritis    "mild arthritis in hip"   CAD (coronary artery disease)    CHF (congestive heart failure) (HCC)    Complication of anesthesia    "lungs filled up with fluid"    Diabetes mellitus    Type II   Glaucoma    Heart murmur    HTN (hypertension)    Hypercholesterolemia    LBP (low back pain)    Legally blind    Peripheral neuropathy    Sleep apnea    c-pap nightly   Staph infection    "from back  surgery"   Status post insertion of drug eluting coronary artery stent    Trigger finger    Ulcer    Wears glasses    "to protect cornea" - legally blind    Past Surgical History:  Procedure Laterality Date   CHOLECYSTECTOMY  09/15/2012   Procedure: LAPAROSCOPIC CHOLECYSTECTOMY WITH INTRAOPERATIVE CHOLANGIOGRAM;  Surgeon: Valarie Merino, MD;  Location: [facility] OR;  Service: General;  Laterality: N/A;   COLONOSCOPY     EYE SURGERY Bilateral    cataracts   INGUINAL HERNIA REPAIR     right   LUMBAR FUSION     LUMBAR LAMINECTOMY/DECOMPRESSION MICRODISCECTOMY N/A 10/26/2015   Procedure: RIGHT AND CENTRAL LUMBAR LAMINECTOMY L3-4, RIGHT L5-S1 LATERAL RECESS DECOMPRESSION;  Surgeon: Kerrin Champagne, MD;  Location: [facility] OR;  Service: Orthopedics;  Laterality: N/A;   NECK SURGERY     peptic ulcer dz surgery  pt was in his 37s   bleeding ulcer.    Prosthetic Cornea placement, Right eye  2007   [facility]   TRIGGER FINGER RELEASE Right 10/26/2015   Procedure: RELEASE TRIGGER FINGER RIGHT THUMB;  Surgeon: Kerrin Champagne, MD;  Location: [facility] OR;  Service: Orthopedics;  Laterality: Right;    Current Medications: Current Meds  Medication Sig   aspirin 81 MG EC tablet Take 1 tablet (81 mg total)  by mouth daily. Swallow whole.   atorvastatin (LIPITOR) 80 MG tablet TAKE 1 TABLET BY MOUTH DAILY AT 6 PM.   Cholecalciferol (VITAMIN D3) 25 MCG (1000 UT) CAPS Take by mouth.   Continuous Blood Gluc Receiver (FREESTYLE LIBRE 14 DAY READER) DEVI APPLY AS DIRECTED   Continuous Blood Gluc Sensor (FREESTYLE LIBRE 2 SENSOR) MISC USE 1 SENSOR EVERY 14 DAYS, THEN REPLACE WITH NEW ONE.   empagliflozin (JARDIANCE) 25 MG TABS tablet Take 1 tablet (25 mg total) by mouth daily.   fluticasone (FLONASE) 50 MCG/ACT nasal spray Place 2 sprays into both nostrils daily.   gabapentin (NEURONTIN) 300 MG capsule TAKE 1 CAPSULE BY MOUTH THREE TIMES A DAY   Glucagon 1 MG/0.2ML SOAJ Inject 1 Act into the skin once as needed for up to 1  dose.   indapamide (LOZOL) 1.25 MG tablet Take 1.25 mg by mouth daily.   Insulin Pen Needle (PEN NEEDLES) 32G X 4 MM MISC Use with insulin pen daily as directed   isosorbide mononitrate (IMDUR) 120 MG 24 hr tablet Take 1 tablet (120 mg total) by mouth daily.   levocetirizine (XYZAL) 5 MG tablet TAKE 1 TABLET BY MOUTH EVERY DAY IN THE EVENING   metFORMIN (GLUCOPHAGE) 500 MG tablet Take 1 tablet (500 mg total) by mouth 2 (two) times daily with a meal.   NEXLIZET 180-10 MG TABS TAKE 1 TABLET BY MOUTH DAILY   nitroGLYCERIN (NITROSTAT) 0.4 MG SL tablet Place 1 tablet (0.4 mg total) under the tongue every 5 (five) minutes as needed for chest pain. Do not exceed total of 3 dose in 15 minutes. Please make overdue appt with Dr. Anne Fu before anymore refills. Thank you 1st attempt   omega-3 acid ethyl esters (LOVAZA) 1 g capsule TAKE 2 CAPSULES BY MOUTH TWICE A DAY   pantoprazole (PROTONIX) 40 MG tablet TAKE 1 TABLET BY MOUTH TWICE A DAY   ranolazine (RANEXA) 500 MG 12 hr tablet Take 1 tablet (500 mg total) by mouth 2 (two) times daily.   SOLIQUA 100-33 UNT-MCG/ML SOPN INJECT 60 UNITS INTO THE SKIN DAILY   tamsulosin (FLOMAX) 0.4 MG CAPS capsule Take 0.4 mg by mouth once.   [DISCONTINUED] carvedilol (COREG) 12.5 MG tablet TAKE 1 TABLET BY MOUTH TWICE A DAY WITH MEALS (NEED OFFICE VISIT)   [DISCONTINUED] traMADol (ULTRAM) 50 MG tablet TAKE 1 TABLET BY MOUTH EVERY 8 HOURS AS NEEDED     Allergies:   Furosemide, Olmesartan, Amlodipine, Diamox [acetazolamide], and Lisinopril   Social History   Socioeconomic History   Marital status: Married    Spouse name: [name]   Number of children: 3   Years of education: 12   Highest education level: Not on file  Occupational History   Occupation: disabled    Comment: blind  Tobacco Use   Smoking status: Never   Smokeless tobacco: Never  Vaping Use   Vaping Use: Never used  Substance and Sexual Activity   Alcohol use: No   Drug use: No   Sexual activity:  Not Currently  Other Topics Concern   Not on file  Social History Narrative   Occupation: disabled, blind   Married   Regular Exercise-no   Lives at home with his wife.   Right-handed.   2-3 cups caffeine per day.   Social Determinants of Health   Financial Resource Strain: Low Risk  (03/04/2021)   Overall Financial Resource Strain (CARDIA)    Difficulty of Paying Living Expenses: Not hard at all  Food  Insecurity: No Food Insecurity (03/04/2021)   Hunger Vital Sign    Worried About Running Out of Food in the Last Year: Never true    Ran Out of Food in the Last Year: Never true  Transportation Needs: Unmet Transportation Needs (03/04/2021)   PRAPARE - Transportation    Lack of Transportation (Medical): Yes    Lack of Transportation (Non-Medical): Yes  Physical Activity: Inactive (03/04/2021)   Exercise Vital Sign    Days of Exercise per Week: 0 days    Minutes of Exercise per Session: 0 min  Stress: No Stress Concern Present (03/04/2021)   [employer] of Occupational Health - Occupational Stress Questionnaire    Feeling of Stress : Not at all  Social Connections: Socially Integrated (03/04/2021)   Social Connection and Isolation Panel [NHANES]    Frequency of Communication with Friends and Family: More than three times a week    Frequency of Social Gatherings with Friends and Family: Once a week    Attends Religious Services: 1 to 4 times per year    Active Member of [employer] or Organizations: No    Attends Engineer, structural: 1 to 4 times per year    Marital Status: Married     Family History: The patient's family history includes Breast cancer in his mother; Colon cancer in his mother and another family member; Diabetes in his brother, mother, and sister; Hypertension in his father and mother. There is no history of Esophageal cancer, Stomach cancer, or Rectal cancer.  ROS:   Please see the history of present illness.    + chest burning + DOE All other systems  reviewed and are negative.  Labs/Other Studies Reviewed:    The following studies were reviewed today:  Lexiscan myoview 04/2020  The left ventricular ejection fraction is normal (55-65%). Nuclear stress EF: 55%. There was no ST segment deviation noted during stress. The study is normal. This is a low risk study.   Normal stress nuclear study with no ischemia or infarction.  Gated ejection fraction 55% with normal wall motion.   Echo 06/2017  Left ventricle: The cavity size was normal. There was severe    concentric hypertrophy. Systolic function was normal. The    estimated ejection fraction was in the range of 60% to 65%. Wall    motion was normal; there were no regional wall motion    abnormalities. Doppler parameters are consistent with abnormal    left ventricular relaxation (grade 1 diastolic dysfunction).    Doppler parameters are consistent with elevated ventricular    end-diastolic filling pressure.  - Aortic valve: Trileaflet; normal thickness leaflets. There was no    regurgitation.  - Aortic root: The aortic root was normal in size.  - Mitral valve: Structurally normal valve. There was mild    regurgitation. Valve area by pressure half-time: 1.29 cm^2.  - Left atrium: The atrium was normal in size.  - Right ventricle: Systolic function was normal.  - Right atrium: The atrium was normal in size.  - Tricuspid valve: There was no regurgitation.  - Pulmonary arteries: Systolic pressure was within the normal    range.  - Inferior vena cava: The vessel was normal in size.  - Pericardium, extracardiac: There was no pericardial effusion.   Recent Labs: 06/25/2022: ALT 11; BUN 15; Creatinine, Ser 1.29; Hemoglobin 11.8; Platelets 244.0; Potassium 4.2; Sodium 139; TSH 1.29  Recent Lipid Panel    Component Value Date/Time   CHOL 93 06/25/2022 1445  TRIG 112.0 06/25/2022 1445   HDL 28.10 (L) 06/25/2022 1445   CHOLHDL 3 06/25/2022 1445   VLDL 22.4 06/25/2022 1445    LDLCALC 43 06/25/2022 1445   LDLCALC 173 (H) 02/08/2020 1645   LDLDIRECT 82.7 04/04/2013 1451     Risk Assessment/Calculations:       Physical Exam:    VS:  BP 122/70   Pulse 72   Ht 5\' 10"  (1.778 m)   Wt 227 lb 12.8 oz (103.3 kg)   SpO2 99%   BMI 32.69 kg/m     Wt Readings from Last 3 Encounters:  06/25/22 228 lb (103.4 kg)  06/25/22 227 lb 12.8 oz (103.3 kg)  12/04/21 224 lb (101.6 kg)     GEN:  Obese male in no acute distress HEENT: Normal NECK: No JVD; No carotid bruits CARDIAC: RRR, no murmurs, rubs, gallops RESPIRATORY:  Clear to auscultation without rales, wheezing or rhonchi  ABDOMEN: Soft, non-tender, non-distended MUSCULOSKELETAL:  No edema; No deformity. 2+ pedal pulses, equal bilaterally SKIN: Warm and dry NEUROLOGIC:  Alert and oriented x 3 PSYCHIATRIC:  Normal affect   EKG:  EKG is ordered today.  The ekg ordered today demonstrates sinus rhythm at 72 bpm with 1st degree AV block, LAD, nonspecific ST/T wave, changes, no acute change from previous  Diagnoses:    1. Coronary artery disease involving native coronary artery of native heart with unstable angina pectoris (HCC)   2. Essential hypertension   3. Chronic systolic congestive heart failure (HCC)   4. Hyperlipidemia LDL goal <70   5. Dyspnea on exertion    Assessment and Plan:     CAD with unstable angina: Recent development of DOE and chest burning with activity. Feels better when he sits and rests. Unable to exercise on a consistent basis and dietary indiscretion may be contributing. Will get lexiscan myoview to evaluate for ischemia. No bleeding problems on aspirin. Continue carvedilol, Imdur, Ranexa, aspirin, atorvastatin.  DOE: LVEF 55% on nuclear stress test 04/2020. As noted above recent increase in  DOE. No edema, orthopnea, PND.  We will update echocardiogram to evaluate for structural heart disease.   Hypertension: BP is well-controlled.  He does not monitor on a consistent basis.  No  medication changes today.  Hyperlipidemia LDL goal < 70: LDL 60 on 06/19/21.  Had fasting lab work at PCP today. Continue atorvastatin.   Disposition: 4 months with Dr. Anne Fu  Shared Decision Making/Informed Consent The risks [chest pain, shortness of breath, cardiac arrhythmias, dizziness, blood pressure fluctuations, myocardial infarction, stroke/transient ischemic attack, nausea, vomiting, allergic reaction, radiation exposure, metallic taste sensation and life-threatening complications (estimated to be 1 in 10,000)], benefits (risk stratification, diagnosing coronary artery disease, treatment guidance) and alternatives of a nuclear stress test were discussed in detail with Mr. Veillon and he agrees to proceed.    Medication Adjustments/Labs and Tests Ordered: Current medicines are reviewed at length with the patient today.  Concerns regarding medicines are outlined above.  Orders Placed This Encounter  Procedures   Cardiac Stress Test: Informed Consent Details: Physician/Practitioner Attestation; Transcribe to consent form and obtain patient signature   Myocardial Perfusion Imaging   EKG 12-Lead   ECHOCARDIOGRAM COMPLETE   Meds ordered this encounter  Medications   carvedilol (COREG) 12.5 MG tablet    Sig: Take one (1) tablet by mouth ( 12.5 mg ) twice daily.    Dispense:  180 tablet    Refill:  2    Patient Instructions  Medication Instructions:  Your physician recommends that you continue on your current medications as directed. Please refer to the Current Medication list given to you today.   *If you need a refill on your cardiac medications before your next appointment, please call your pharmacy*   Lab Work:   None ordered.   If you have labs (blood work) drawn today and your tests are completely normal, you will receive your results only by: MyChart Message (if you have MyChart) OR A paper copy in the mail If you have any lab test that is abnormal or we need to  change your treatment, we will call you to review the results.   Testing/Procedures:  Your physician has requested that you have an echocardiogram. Echocardiography is a painless test that uses sound waves to create images of your heart. It provides your doctor with information about the size and shape of your heart and how well your heart's chambers and valves are working. This procedure takes approximately one hour. There are no restrictions for this procedure.  You are scheduled for a Myocardial Perfusion Imaging Study on Thursday, July 20 at 8:15 am.   Please arrive 15 minutes prior to your appointment time for registration and insurance purposes.   The test will take approximately 3 to 4 hours to complete; you may bring reading material. If someone comes with you to your appointment, they will need to remain in the main lobby due to limited space in the testing area.   How to prepare for your Myocardial Perfusion test:   Do not eat or drink 3 hours prior to your test, except you may have water.    Do not consume products containing caffeine (regular or decaffeinated) 12 hours prior to your test (ex: coffee, chocolate, soda, tea)   Do bring a list of your current medications with you. If not listed below, you may take your medications as normal.   Bring any held medication to your appointment, as you may be required to take it once the test is complete.   Do wear comfortable clothes (no  overalls) and walking shoes. Tennis shoes are preferred. No open toed shoes.  Do not wear cologne, aftershave or lotions (deodorant is allowed).   If these instructions are not followed, you test will have to be rescheduled.   Please report to [address] for your test. If you have questions or concerns about your appointment, please call the Nuclear Lab at #[phone].  If you cannot keep your appointment, please provide 24 hour notification to the Nuclear lab to avoid a  possible $50 charge to your account.       Follow-Up: At [facility], you and your health needs are our priority.  As part of our continuing mission to provide you with exceptional heart care, we have created designated Provider Care Teams.  These Care Teams include your primary Cardiologist (physician) and Advanced Practice Providers (APPs -  Physician Assistants and Nurse Practitioners) who all work together to provide you with the care you need, when you need it.  We recommend signing up for the patient portal called "MyChart".  Sign up information is provided on this After Visit Summary.  MyChart is used to connect with patients for Virtual Visits (Telemedicine).  Patients are able to view lab/test results, encounter notes, upcoming appointments, etc.  Non-urgent messages can be sent to your provider as well.   To learn more about what you can do with MyChart, go to [email or web address].  Your next appointment:   4 month(s)  The format for your next appointment:   In Person  Provider:   Donato Schultz, MD     Important Information About Sugar         Signed, Levi Aland, NP  06/25/2022 5:25 PM    [facility]

## 2022-06-25 ENCOUNTER — Encounter: Payer: Self-pay | Admitting: Nurse Practitioner

## 2022-06-25 ENCOUNTER — Ambulatory Visit (INDEPENDENT_AMBULATORY_CARE_PROVIDER_SITE_OTHER): Payer: PPO | Admitting: Nurse Practitioner

## 2022-06-25 ENCOUNTER — Encounter: Payer: Self-pay | Admitting: Internal Medicine

## 2022-06-25 ENCOUNTER — Ambulatory Visit (INDEPENDENT_AMBULATORY_CARE_PROVIDER_SITE_OTHER): Payer: PPO | Admitting: Internal Medicine

## 2022-06-25 VITALS — BP 138/64 | HR 70 | Temp 98.6°F | Ht 70.0 in | Wt 228.0 lb

## 2022-06-25 VITALS — BP 122/70 | HR 72 | Ht 70.0 in | Wt 227.8 lb

## 2022-06-25 DIAGNOSIS — Z0000 Encounter for general adult medical examination without abnormal findings: Secondary | ICD-10-CM | POA: Diagnosis not present

## 2022-06-25 DIAGNOSIS — I5032 Chronic diastolic (congestive) heart failure: Secondary | ICD-10-CM | POA: Diagnosis not present

## 2022-06-25 DIAGNOSIS — M5416 Radiculopathy, lumbar region: Secondary | ICD-10-CM | POA: Diagnosis not present

## 2022-06-25 DIAGNOSIS — E785 Hyperlipidemia, unspecified: Secondary | ICD-10-CM | POA: Diagnosis not present

## 2022-06-25 DIAGNOSIS — E0849 Diabetes mellitus due to underlying condition with other diabetic neurological complication: Secondary | ICD-10-CM

## 2022-06-25 DIAGNOSIS — E118 Type 2 diabetes mellitus with unspecified complications: Secondary | ICD-10-CM | POA: Diagnosis not present

## 2022-06-25 DIAGNOSIS — N401 Enlarged prostate with lower urinary tract symptoms: Secondary | ICD-10-CM | POA: Diagnosis not present

## 2022-06-25 DIAGNOSIS — I5022 Chronic systolic (congestive) heart failure: Secondary | ICD-10-CM

## 2022-06-25 DIAGNOSIS — N182 Chronic kidney disease, stage 2 (mild): Secondary | ICD-10-CM | POA: Diagnosis not present

## 2022-06-25 DIAGNOSIS — D510 Vitamin B12 deficiency anemia due to intrinsic factor deficiency: Secondary | ICD-10-CM

## 2022-06-25 DIAGNOSIS — I25110 Atherosclerotic heart disease of native coronary artery with unstable angina pectoris: Secondary | ICD-10-CM

## 2022-06-25 DIAGNOSIS — I10 Essential (primary) hypertension: Secondary | ICD-10-CM

## 2022-06-25 DIAGNOSIS — M961 Postlaminectomy syndrome, not elsewhere classified: Secondary | ICD-10-CM

## 2022-06-25 DIAGNOSIS — R3912 Poor urinary stream: Secondary | ICD-10-CM

## 2022-06-25 DIAGNOSIS — Z23 Encounter for immunization: Secondary | ICD-10-CM

## 2022-06-25 DIAGNOSIS — D539 Nutritional anemia, unspecified: Secondary | ICD-10-CM | POA: Insufficient documentation

## 2022-06-25 DIAGNOSIS — R0609 Other forms of dyspnea: Secondary | ICD-10-CM

## 2022-06-25 LAB — URINALYSIS, ROUTINE W REFLEX MICROSCOPIC
Bilirubin Urine: NEGATIVE
Hgb urine dipstick: NEGATIVE
Ketones, ur: NEGATIVE
Leukocytes,Ua: NEGATIVE
Nitrite: NEGATIVE
RBC / HPF: NONE SEEN (ref 0–?)
Specific Gravity, Urine: 1.01 (ref 1.000–1.030)
Total Protein, Urine: NEGATIVE
Urine Glucose: >=1000 — AB
Urobilinogen, UA: 0.2 (ref 0.0–1.0)
pH: 6 (ref 5.0–8.0)

## 2022-06-25 LAB — CBC WITH DIFFERENTIAL/PLATELET
Basophils Absolute: 0 10*3/uL (ref 0.0–0.1)
Basophils Relative: 0.5 % (ref 0.0–3.0)
Eosinophils Absolute: 0.2 10*3/uL (ref 0.0–0.7)
Eosinophils Relative: 3.7 % (ref 0.0–5.0)
HCT: 37.4 % — ABNORMAL LOW (ref 39.0–52.0)
Hemoglobin: 11.8 g/dL — ABNORMAL LOW (ref 13.0–17.0)
Lymphocytes Relative: 33.5 % (ref 12.0–46.0)
Lymphs Abs: 1.9 10*3/uL (ref 0.7–4.0)
MCHC: 31.7 g/dL (ref 30.0–36.0)
MCV: 71.1 fl — ABNORMAL LOW (ref 78.0–100.0)
Monocytes Absolute: 0.6 10*3/uL (ref 0.1–1.0)
Monocytes Relative: 10.4 % (ref 3.0–12.0)
Neutro Abs: 2.9 10*3/uL (ref 1.4–7.7)
Neutrophils Relative %: 51.9 % (ref 43.0–77.0)
Platelets: 244 10*3/uL (ref 150.0–400.0)
RBC: 5.26 Mil/uL (ref 4.22–5.81)
RDW: 16.2 % — ABNORMAL HIGH (ref 11.5–15.5)
WBC: 5.7 10*3/uL (ref 4.0–10.5)

## 2022-06-25 LAB — LIPID PANEL
Cholesterol: 93 mg/dL (ref 0–200)
HDL: 28.1 mg/dL — ABNORMAL LOW (ref >39.00)
LDL Cholesterol: 43 mg/dL (ref 0–99)
NonHDL: 65.32
Total CHOL/HDL Ratio: 3
Triglycerides: 112 mg/dL (ref 0.0–149.0)
VLDL: 22.4 mg/dL (ref 0.0–40.0)

## 2022-06-25 LAB — HEPATIC FUNCTION PANEL
ALT: 11 U/L (ref 0–53)
AST: 13 U/L (ref 0–37)
Albumin: 4.3 g/dL (ref 3.5–5.2)
Alkaline Phosphatase: 46 U/L (ref 39–117)
Bilirubin, Direct: 0.1 mg/dL (ref 0.0–0.3)
Total Bilirubin: 0.5 mg/dL (ref 0.2–1.2)
Total Protein: 6.9 g/dL (ref 6.0–8.3)

## 2022-06-25 LAB — BASIC METABOLIC PANEL
BUN: 15 mg/dL (ref 6–23)
CO2: 26 mEq/L (ref 19–32)
Calcium: 8.7 mg/dL (ref 8.4–10.5)
Chloride: 104 mEq/L (ref 96–112)
Creatinine, Ser: 1.29 mg/dL (ref 0.40–1.50)
GFR: 55.26 mL/min — ABNORMAL LOW (ref >60.00)
Glucose, Bld: 140 mg/dL — ABNORMAL HIGH (ref 70–99)
Potassium: 4.2 mEq/L (ref 3.5–5.1)
Sodium: 139 mEq/L (ref 135–145)

## 2022-06-25 LAB — MICROALBUMIN / CREATININE URINE RATIO
Creatinine,U: 62.3 mg/dL
Microalb Creat Ratio: 1.6 mg/g (ref 0.0–30.0)
Microalb, Ur: 1 mg/dL (ref 0.0–1.9)

## 2022-06-25 LAB — PSA: PSA: 2.75 ng/mL (ref 0.10–4.00)

## 2022-06-25 LAB — TSH: TSH: 1.29 u[IU]/mL (ref 0.35–5.50)

## 2022-06-25 LAB — HEMOGLOBIN A1C: Hgb A1c MFr Bld: 7.2 % — ABNORMAL HIGH (ref 4.6–6.5)

## 2022-06-25 MED ORDER — TRAMADOL HCL 50 MG PO TABS: 50.0000 mg | ORAL_TABLET | Freq: Three times a day (TID) | ORAL | 1 refills | Status: DC | PRN

## 2022-06-25 MED ORDER — CARVEDILOL 12.5 MG PO TABS: ORAL_TABLET | ORAL | 2 refills | Status: DC

## 2022-06-25 NOTE — Progress Notes (Signed)
Subjective:  Patient ID: [name], male    DOB: [birth year]  Age: 71 y.o. MRN: [id number]  CC: Annual Exam, Anemia, Hypertension, Hyperlipidemia, and Back Pain   HPI [name] presents for a CPX and f/up -   He requests a refill of tramadol for low back pain.  The back pain does not radiate and he denies lower extremity paresthesias.  He denies chest pain, shortness of breath, diaphoresis, or edema.  Outpatient Medications Prior to Visit  Medication Sig Dispense Refill   aspirin 81 MG EC tablet Take 1 tablet (81 mg total) by mouth daily. Swallow whole. 90 tablet 1   atorvastatin (LIPITOR) 80 MG tablet TAKE 1 TABLET BY MOUTH DAILY AT 6 PM. 90 tablet 1   carvedilol (COREG) 12.5 MG tablet Take one (1) tablet by mouth ( 12.5 mg ) twice daily. 180 tablet 2   Cholecalciferol (VITAMIN D3) 25 MCG (1000 UT) CAPS Take by mouth.     Continuous Blood Gluc Receiver (FREESTYLE LIBRE 14 DAY READER) DEVI APPLY AS DIRECTED 2 each 5   Continuous Blood Gluc Sensor (FREESTYLE LIBRE 2 SENSOR) MISC USE 1 SENSOR EVERY 14 DAYS, THEN REPLACE WITH NEW ONE. 2 each 5   empagliflozin (JARDIANCE) 25 MG TABS tablet Take 1 tablet (25 mg total) by mouth daily. 90 tablet 1   fluticasone (FLONASE) 50 MCG/ACT nasal spray Place 2 sprays into both nostrils daily. 48 mL 3   gabapentin (NEURONTIN) 300 MG capsule TAKE 1 CAPSULE BY MOUTH THREE TIMES A DAY 270 capsule 2   Glucagon 1 MG/0.2ML SOAJ Inject 1 Act into the skin once as needed for up to 1 dose. 0.2 mL 3   indapamide (LOZOL) 1.25 MG tablet Take 1.25 mg by mouth daily.     Insulin Pen Needle (PEN NEEDLES) 32G X 4 MM MISC Use with insulin pen daily as directed 100 each 3   isosorbide mononitrate (IMDUR) 120 MG 24 hr tablet Take 1 tablet (120 mg total) by mouth daily. 90 tablet 1   levocetirizine (XYZAL) 5 MG tablet TAKE 1 TABLET BY MOUTH EVERY DAY IN THE EVENING 90 tablet 3   metFORMIN (GLUCOPHAGE) 500 MG tablet Take 1 tablet (500 mg total) by mouth 2 (two) times  daily with a meal. 180 tablet 1   NEXLIZET 180-10 MG TABS TAKE 1 TABLET BY MOUTH DAILY 90 tablet 1   nitroGLYCERIN (NITROSTAT) 0.4 MG SL tablet Place 1 tablet (0.4 mg total) under the tongue every 5 (five) minutes as needed for chest pain. Do not exceed total of 3 dose in 15 minutes. Please make overdue appt with Dr. Marlou Porch before anymore refills. Thank you 1st attempt 25 tablet 0   omega-3 acid ethyl esters (LOVAZA) 1 g capsule TAKE 2 CAPSULES BY MOUTH TWICE A DAY 360 capsule 1   pantoprazole (PROTONIX) 40 MG tablet TAKE 1 TABLET BY MOUTH TWICE A DAY 180 tablet 0   ranolazine (RANEXA) 500 MG 12 hr tablet Take 1 tablet (500 mg total) by mouth 2 (two) times daily. 180 tablet 1   SOLIQUA 100-33 UNT-MCG/ML SOPN INJECT 60 UNITS INTO THE SKIN DAILY 54 mL 1   tamsulosin (FLOMAX) 0.4 MG CAPS capsule Take 0.4 mg by mouth once.     traMADol (ULTRAM) 50 MG tablet TAKE 1 TABLET BY MOUTH EVERY 8 HOURS AS NEEDED 270 tablet 0   No facility-administered medications prior to visit.    ROS Review of Systems  Constitutional: Negative.  Negative for diaphoresis  and fatigue.  HENT: Negative.    Eyes:  Positive for visual disturbance.  Respiratory:  Negative for cough, chest tightness, shortness of breath and wheezing.   Cardiovascular:  Negative for chest pain, palpitations and leg swelling.  Gastrointestinal:  Negative for abdominal pain, constipation, diarrhea, nausea and vomiting.  Endocrine: Negative.   Genitourinary: Negative.  Negative for difficulty urinating and dysuria.  Musculoskeletal:  Positive for back pain. Negative for arthralgias and myalgias.  Skin: Negative.  Negative for color change and rash.  Neurological:  Negative for dizziness, weakness, light-headedness, numbness and headaches.  Hematological:  Negative for adenopathy. Does not bruise/bleed easily.  Psychiatric/Behavioral: Negative.      Objective:  BP 138/64 (BP Location: Right Arm, Patient Position: Sitting, Cuff Size: Large)    Pulse 70   Temp 98.6 F (37 C) (Oral)   Ht '5\' 10"'$  (1.778 m)   Wt 228 lb (103.4 kg)   SpO2 99%   BMI 32.71 kg/m   BP Readings from Last 3 Encounters:  06/25/22 138/64  06/25/22 122/70  12/04/21 136/80    Wt Readings from Last 3 Encounters:  06/25/22 228 lb (103.4 kg)  06/25/22 227 lb 12.8 oz (103.3 kg)  12/04/21 224 lb (101.6 kg)    Physical Exam Vitals reviewed.  HENT:     Mouth/Throat:     Mouth: Mucous membranes are moist.  Eyes:     General: No scleral icterus. Cardiovascular:     Rate and Rhythm: Normal rate and regular rhythm.     Heart sounds: No murmur heard. Pulmonary:     Effort: Pulmonary effort is normal.     Breath sounds: No stridor. No wheezing, rhonchi or rales.  Abdominal:     General: Abdomen is flat.     Palpations: There is no mass.     Tenderness: There is no abdominal tenderness. There is no guarding.     Hernia: No hernia is present.  Musculoskeletal:        General: Normal range of motion.     Cervical back: Neck supple.     Right lower leg: No edema.     Left lower leg: No edema.  Lymphadenopathy:     Cervical: No cervical adenopathy.  Skin:    General: Skin is warm and dry.  Neurological:     General: No focal deficit present.     Mental Status: He is alert. Mental status is at baseline.  Psychiatric:        Mood and Affect: Mood normal.        Behavior: Behavior normal.     Lab Results  Component Value Date   WBC 5.7 06/25/2022   HGB 11.8 (L) 06/25/2022   HCT 37.4 (L) 06/25/2022   PLT 244.0 06/25/2022   GLUCOSE 140 (H) 06/25/2022   CHOL 93 06/25/2022   TRIG 112.0 06/25/2022   HDL 28.10 (L) 06/25/2022   LDLDIRECT 82.7 04/04/2013   LDLCALC 43 06/25/2022   ALT 11 06/25/2022   AST 13 06/25/2022   NA 139 06/25/2022   K 4.2 06/25/2022   CL 104 06/25/2022   CREATININE 1.29 06/25/2022   BUN 15 06/25/2022   CO2 26 06/25/2022   TSH 1.29 06/25/2022   PSA 2.75 06/25/2022   INR 0.95 08/17/2010   HGBA1C 7.2 (H) 06/25/2022    MICROALBUR 1.0 06/25/2022    VAS [place] LOWER EXTREMITY VENOUS (DVT)  Result Date: 07/23/2018  Lower Venous DVT Study Indications: Edema, bilaterally. Denies SOB.  Risk Factors: None  identified. Performing Technologist: Sharlett Iles RVT  Examination Guidelines: A complete evaluation includes B-mode imaging, spectral Doppler, color Doppler, and power Doppler as needed of all accessible portions of each vessel. Bilateral testing is considered an integral part of a complete examination. Limited examinations for reoccurring indications may be performed as noted. The reflux portion of the exam is performed with the patient in reverse Trendelenburg.  Right Venous Findings: +---------+---------------+---------+-----------+----------+-------+          CompressibilityPhasicitySpontaneityPropertiesSummary +---------+---------------+---------+-----------+----------+-------+ CFV      Full           Yes      Yes                          +---------+---------------+---------+-----------+----------+-------+ SFJ      Full           Yes      Yes                          +---------+---------------+---------+-----------+----------+-------+ FV Prox  Full           Yes      Yes                          +---------+---------------+---------+-----------+----------+-------+ FV Mid   Full                                                 +---------+---------------+---------+-----------+----------+-------+ FV DistalFull           Yes      Yes                          +---------+---------------+---------+-----------+----------+-------+ PFV      Full           Yes      Yes                          +---------+---------------+---------+-----------+----------+-------+ POP      Full           Yes      Yes                          +---------+---------------+---------+-----------+----------+-------+ PTV      Full           Yes      No                            +---------+---------------+---------+-----------+----------+-------+ PERO     Full           Yes      No                           +---------+---------------+---------+-----------+----------+-------+ Gastroc  Full                                                 +---------+---------------+---------+-----------+----------+-------+ GSV      Full           Yes  Yes                          +---------+---------------+---------+-----------+----------+-------+  Left Venous Findings: +---------+---------------+---------+-----------+----------+-------+          CompressibilityPhasicitySpontaneityPropertiesSummary +---------+---------------+---------+-----------+----------+-------+ CFV      Full           Yes      Yes                          +---------+---------------+---------+-----------+----------+-------+ SFJ      Full           Yes      Yes                          +---------+---------------+---------+-----------+----------+-------+ FV Prox  Full           Yes      Yes                          +---------+---------------+---------+-----------+----------+-------+ FV Mid   Full                                                 +---------+---------------+---------+-----------+----------+-------+ FV DistalFull           Yes      Yes                          +---------+---------------+---------+-----------+----------+-------+ PFV      Full           Yes      Yes                          +---------+---------------+---------+-----------+----------+-------+ POP      Full           Yes      Yes                          +---------+---------------+---------+-----------+----------+-------+ PTV      Full           Yes      No                           +---------+---------------+---------+-----------+----------+-------+ PERO     Full           Yes      No                            +---------+---------------+---------+-----------+----------+-------+ Gastroc  Full                                                 +---------+---------------+---------+-----------+----------+-------+ GSV      Full           Yes      Yes                          +---------+---------------+---------+-----------+----------+-------+    Final Interpretation: Right: No  evidence of deep vein thrombosis in the lower extremity. No indirect evidence of obstruction proximal to the inguinal ligament. Left: No evidence of deep vein thrombosis in the lower extremity. No indirect evidence of obstruction proximal to the inguinal ligament.  *See table(s) above for measurements and observations. Electronically signed by Ida Rogue MD on 07/23/2018 at 7:37:05 PM.    Final     Assessment & Plan:   Daemion was seen today for annual exam, anemia, hypertension, hyperlipidemia and back pain.  Diagnoses and all orders for this visit:  Chronic diastolic heart failure ([place])- He has a normal volume status.  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  Lumbar radiculopathy -     traMADol (ULTRAM) 50 MG tablet; Take 1 tablet (50 mg total) by mouth every 8 (eight) hours as needed.  Postlaminectomy syndrome, lumbar region -     traMADol (ULTRAM) 50 MG tablet; Take 1 tablet (50 mg total) by mouth every 8 (eight) hours as needed.  Type 2 diabetes mellitus with complication ([place])- His blood sugar is well controlled. -     Hemoglobin A1c; Future -     Microalbumin / creatinine urine ratio; Future -     Microalbumin / creatinine urine ratio -     Hemoglobin A1c  CKD (chronic kidney disease) stage 2, GFR 60-89 ml/min- His renal function has improved. -     Basic metabolic panel; Future -     Urinalysis, Routine w reflex microscopic; Future -      Microalbumin / creatinine urine ratio; Future -     Microalbumin / creatinine urine ratio -     Urinalysis, Routine w reflex microscopic -     Basic metabolic panel  Benign prostatic hyperplasia with weak urinary stream- His PSA is normal. -     PSA; Future -     Urinalysis, Routine w reflex microscopic; Future -     Urinalysis, Routine w reflex microscopic -     PSA  Vitamin B12 deficiency anemia due to intrinsic factor deficiency- I will monitor his B12 and folate levels. -     CBC with Differential/Platelet; Future -     CBC with Differential/Platelet  Dyslipidemia, goal LDL below 70- LDL goal achieved. Doing well on the statin  -     Lipid panel; Future -     TSH; Future -     Hepatic function panel; Future -     Hepatic function panel -     TSH -     Lipid panel  Routine general medical examination at a health care facility- Exam completed, labs reviewed, vaccines reviewed and updated, cancer screenings are up-to-date, patient education was given.  Deficiency anemia -     IBC + Ferritin; Future -     Reticulocytes; Future -     Vitamin B1; Future -     Zinc; Future -     Vitamin B12; Future -     Folate; Future   I have changed Carloyn Manner B. Oyer's traMADol. I am also having him start on Shingrix. Additionally, I am having him maintain his Vitamin D3, tamsulosin, FreeStyle Libre 14 Day Reader, indapamide, fluticasone, Pen Needles, levocetirizine, gabapentin, aspirin EC, Soliqua, nitroGLYCERIN, atorvastatin, FreeStyle Libre 2 Sensor, isosorbide mononitrate, empagliflozin, metFORMIN, ranolazine, Glucagon, Nexlizet, pantoprazole, omega-3 acid ethyl esters, and  carvedilol.  Meds ordered this encounter  Medications   traMADol (ULTRAM) 50 MG tablet    Sig: Take 1 tablet (50 mg total) by mouth every 8 (eight) hours as needed.    Dispense:  270 tablet    Refill:  1    This request is for a new prescription for a controlled substance as required by Federal/State law. DX Code  Needed  .   Zoster Vaccine Adjuvanted [facility]) injection    Sig: Inject 0.5 mLs into the muscle once for 1 dose.    Dispense:  0.5 mL    Refill:  1     Follow-up: Return in about 6 months (around 12/26/2022).  [name], MD

## 2022-06-25 NOTE — Patient Instructions (Signed)
Health Maintenance, Male Adopting a healthy lifestyle and getting preventive care are important in promoting health and wellness. Ask your health care provider about: The right schedule for you to have regular tests and exams. Things you can do on your own to prevent diseases and keep yourself healthy. What should I know about diet, weight, and exercise? Eat a healthy diet  Eat a diet that includes plenty of vegetables, fruits, low-fat dairy products, and lean protein. Do not eat a lot of foods that are high in solid fats, added sugars, or sodium. Maintain a healthy weight Body mass index (BMI) is a measurement that can be used to identify possible weight problems. It estimates body fat based on height and weight. Your health care provider can help determine your BMI and help you achieve or maintain a healthy weight. Get regular exercise Get regular exercise. This is one of the most important things you can do for your health. Most adults should: Exercise for at least 150 minutes each week. The exercise should increase your heart rate and make you sweat (moderate-intensity exercise). Do strengthening exercises at least twice a week. This is in addition to the moderate-intensity exercise. Spend less time sitting. Even light physical activity can be beneficial. Watch cholesterol and blood lipids Have your blood tested for lipids and cholesterol at 71 years of age, then have this test every 5 years. You may need to have your cholesterol levels checked more often if: Your lipid or cholesterol levels are high. You are older than 73 years of age. You are at high risk for heart disease. What should I know about cancer screening? Many types of cancers can be detected early and may often be prevented. Depending on your health history and family history, you may need to have cancer screening at various ages. This may include screening for: Colorectal cancer. Prostate cancer. Skin cancer. Lung  cancer. What should I know about heart disease, diabetes, and high blood pressure? Blood pressure and heart disease High blood pressure causes heart disease and increases the risk of stroke. This is more likely to develop in people who have high blood pressure readings or are overweight. Talk with your health care provider about your target blood pressure readings. Have your blood pressure checked: Every 3-5 years if you are 18-39 years of age. Every year if you are 40 years old or older. If you are between the ages of 65 and 75 and are a current or former smoker, ask your health care provider if you should have a one-time screening for abdominal aortic aneurysm (AAA). Diabetes Have regular diabetes screenings. This checks your fasting blood sugar level. Have the screening done: Once every three years after age 45 if you are at a normal weight and have a low risk for diabetes. More often and at a younger age if you are overweight or have a high risk for diabetes. What should I know about preventing infection? Hepatitis B If you have a higher risk for hepatitis B, you should be screened for this virus. Talk with your health care provider to find out if you are at risk for hepatitis B infection. Hepatitis C Blood testing is recommended for: Everyone born from 1945 through 1965. Anyone with known risk factors for hepatitis C. Sexually transmitted infections (STIs) You should be screened each year for STIs, including gonorrhea and chlamydia, if: You are sexually active and are younger than 73 years of age. You are older than 73 years of age and your   health care provider tells you that you are at risk for this type of infection. Your sexual activity has changed since you were last screened, and you are at increased risk for chlamydia or gonorrhea. Ask your health care provider if you are at risk. Ask your health care provider about whether you are at high risk for HIV. Your health care provider  may recommend a prescription medicine to help prevent HIV infection. If you choose to take medicine to prevent HIV, you should first get tested for HIV. You should then be tested every 3 months for as long as you are taking the medicine. Follow these instructions at home: Alcohol use Do not drink alcohol if your health care provider tells you not to drink. If you drink alcohol: Limit how much you have to 0-2 drinks a day. Know how much alcohol is in your drink. In the [place] one drink equals one 12 oz bottle of beer (355 mL), one 5 oz glass of wine (148 mL), or one 1 oz glass of hard liquor (44 mL). Lifestyle Do not use any products that contain nicotine or tobacco. These products include cigarettes, chewing tobacco, and vaping devices, such as e-cigarettes. If you need help quitting, ask your health care provider. Do not use street drugs. Do not share needles. Ask your health care provider for help if you need support or information about quitting drugs. General instructions Schedule regular health, dental, and eye exams. Stay current with your vaccines. Tell your health care provider if: You often feel depressed. You have ever been abused or do not feel safe at home. Summary Adopting a healthy lifestyle and getting preventive care are important in promoting health and wellness. Follow your health care provider's instructions about healthy diet, exercising, and getting tested or screened for diseases. Follow your health care provider's instructions on monitoring your cholesterol and blood pressure. This information is not intended to replace advice given to you by your health care provider. Make sure you discuss any questions you have with your health care provider. Document Revised: 04/29/2021 Document Reviewed: 04/29/2021 Elsevier Patient Education  2023 Elsevier Inc.  

## 2022-06-25 NOTE — Patient Instructions (Signed)
Medication Instructions:   Your physician recommends that you continue on your current medications as directed. Please refer to the Current Medication list given to you today.   *If you need a refill on your cardiac medications before your next appointment, please call your pharmacy*   Lab Work:   None ordered.   If you have labs (blood work) drawn today and your tests are completely normal, you will receive your results only by: [place] (if you have MyChart) OR A paper copy in the mail If you have any lab test that is abnormal or we need to change your treatment, we will call you to review the results.   Testing/Procedures:  Your physician has requested that you have an echocardiogram. Echocardiography is a painless test that uses sound waves to create images of your heart. It provides your doctor with information about the size and shape of your heart and how well your heart's chambers and valves are working. This procedure takes approximately one hour. There are no restrictions for this procedure.  You are scheduled for a Myocardial Perfusion Imaging Study on Thursday, July 20 at 8:15 am.   Please arrive 15 minutes prior to your appointment time for registration and insurance purposes.   The test will take approximately 3 to 4 hours to complete; you may bring reading material. If someone comes with you to your appointment, they will need to remain in the main lobby due to limited space in the testing area.   How to prepare for your Myocardial Perfusion test:   Do not eat or drink 3 hours prior to your test, except you may have water.    Do not consume products containing caffeine (regular or decaffeinated) 12 hours prior to your test (ex: coffee, chocolate, soda, tea)   Do bring a list of your current medications with you. If not listed below, you may take your medications as normal.   Bring any held medication to your appointment, as you may be required to take it  once the test is complete.   Do wear comfortable clothes (no  overalls) and walking shoes. Tennis shoes are preferred. No open toed shoes.  Do not wear cologne, aftershave or lotions (deodorant is allowed).   If these instructions are not followed, you test will have to be rescheduled.   Please report to [address] for your test. If you have questions or concerns about your appointment, please call the Nuclear Lab at [phone].  If you cannot keep your appointment, please provide 24 hour notification to the Nuclear lab to avoid a possible $50 charge to your account.       Follow-Up: At [facility], you and your health needs are our priority.  As part of our continuing mission to provide you with exceptional heart care, we have created designated Provider Care Teams.  These Care Teams include your primary Cardiologist (physician) and Advanced Practice Providers (APPs -  Physician Assistants and Nurse Practitioners) who all work together to provide you with the care you need, when you need it.  We recommend signing up for the patient portal called "MyChart".  Sign up information is provided on this After Visit Summary.  MyChart is used to connect with patients for Virtual Visits (Telemedicine).  Patients are able to view lab/test results, encounter notes, upcoming appointments, etc.  Non-urgent messages can be sent to your provider as well.   To learn more about what you can do with MyChart, go  to [email or web address].    Your next appointment:   4 month(s)  The format for your next appointment:   In Person  Provider:   Candee Furbish, MD     Important Information About Sugar

## 2022-06-27 MED ORDER — SHINGRIX 50 MCG/0.5ML IM SUSR: 0.5000 mL | Freq: Once | INTRAMUSCULAR | 1 refills | Status: AC

## 2022-06-30 ENCOUNTER — Ambulatory Visit (INDEPENDENT_AMBULATORY_CARE_PROVIDER_SITE_OTHER): Payer: PPO | Admitting: Podiatry

## 2022-06-30 ENCOUNTER — Encounter: Payer: Self-pay | Admitting: Podiatry

## 2022-06-30 DIAGNOSIS — M79609 Pain in unspecified limb: Secondary | ICD-10-CM | POA: Diagnosis not present

## 2022-06-30 DIAGNOSIS — E1142 Type 2 diabetes mellitus with diabetic polyneuropathy: Secondary | ICD-10-CM

## 2022-06-30 DIAGNOSIS — B351 Tinea unguium: Secondary | ICD-10-CM | POA: Diagnosis not present

## 2022-07-05 NOTE — Progress Notes (Signed)
  Subjective:  Patient ID: [name], male    DOB: 10-28-1951,  MRN: [id number]  NICHOLAI WILLETTE presents to clinic today for at risk foot care with history of diabetic neuropathy and painful elongated mycotic toenails 1-5 bilaterally which are tender when wearing enclosed shoe gear. Pain is relieved with periodic professional debridement.  Last A1c was 7.2%. Patient did not check blood glucose today.  New problem(s): None.   Patient's wife is present during today's visit.  PCP is [name], MD , and last visit was  June 25, 2022  Allergies  Allergen Reactions   Furosemide     pancreatitis   Olmesartan Other (See Comments)    hyperkalemia   Amlodipine Swelling    LE EDEMA   Diamox [Acetazolamide] Itching    hives   Lisinopril Rash    Review of Systems: Negative except as noted in the HPI.  Objective: No changes noted in today's physical examination. JAMARE VANATTA is a pleasant 71 y.o. male in NAD. AAO X 3.  Vascular Examination: Capillary fill time to digits <3 seconds b/l lower extremities. Faintly palpable DP pulse(s) b/l lower extremities. Nonpalpable PT pulse(s) b/l lower extremities. Pedal hair absent. Lower extremity skin temperature gradient within normal limits. No pain with calf compression b/l. RLE edema of ankle.  Dermatological Examination: Pedal skin is thin shiny, atrophic b/l lower extremities. No open wounds b/l lower extremities No interdigital macerations b/l lower extremities Toenails 1-5 b/l elongated, discolored, dystrophic, thickened, crumbly with subungual debris and tenderness to dorsal palpation.  Musculoskeletal Examination: Muscle strength 3/5 to all LE muscle groups of left lower extremity. Muscle strength 2/5 to all LE muscle groups of right lower extremity. No pain crepitus or joint limitation noted with ROM b/l. No gross bony deformities bilaterally. Wearing appropriate fitting shoe gear. Utilizes cane for ambulation  assistance.  Neurological Examination: Pt has subjective symptoms of neuropathy. Protective sensation diminished with 10g monofilament b/l. Vibratory sensation diminished b/l.    Latest Ref Rng & Units 06/25/2022    2:45 PM 12/04/2021   10:05 AM  Hemoglobin A1C  Hemoglobin-A1c 4.6 - 6.5 % 7.2  6.9    Assessment/Plan: 1. Pain due to onychomycosis of nail   2. Diabetic peripheral neuropathy associated with type 2 diabetes mellitus ([place])     -Examined patient. -Continue foot and shoe inspections daily. Monitor blood glucose per PCP/Endocrinologist's recommendations. -Patient to continue soft, supportive shoe gear daily. -Toenails 1-5 b/l were debrided in length and girth with sterile nail nippers and dremel without iatrogenic bleeding.  -Patient/POA to call should there be question/concern in the interim.   Return in about 3 months (around 09/30/2022).  Marzetta Board, DPM

## 2022-07-08 ENCOUNTER — Telehealth (HOSPITAL_COMMUNITY): Payer: Self-pay

## 2022-07-08 NOTE — Telephone Encounter (Signed)
Detailed instructions left on the patient's answering machine. Asked to call back with any questions. S.Aubryana Vittorio EMTP 

## 2022-07-10 ENCOUNTER — Ambulatory Visit (HOSPITAL_BASED_OUTPATIENT_CLINIC_OR_DEPARTMENT_OTHER): Payer: PPO

## 2022-07-10 ENCOUNTER — Ambulatory Visit (HOSPITAL_COMMUNITY): Payer: PPO | Attending: Internal Medicine

## 2022-07-10 DIAGNOSIS — I25110 Atherosclerotic heart disease of native coronary artery with unstable angina pectoris: Secondary | ICD-10-CM | POA: Diagnosis not present

## 2022-07-10 DIAGNOSIS — I10 Essential (primary) hypertension: Secondary | ICD-10-CM | POA: Insufficient documentation

## 2022-07-10 LAB — MYOCARDIAL PERFUSION IMAGING
LV dias vol: 70 mL (ref 62–150)
LV sys vol: 27 mL
Nuc Stress EF: 61 %
Peak HR: 83 {beats}/min
Rest HR: 60 {beats}/min
Rest Nuclear Isotope Dose: 9.6 mCi
SDS: 0
SRS: 0
SSS: 0
ST Depression (mm): 0 mm
Stress Nuclear Isotope Dose: 31.4 mCi
TID: 1.08

## 2022-07-10 LAB — ECHOCARDIOGRAM COMPLETE
Area-P 1/2: 2.93 cm2
Height: 70 in
S' Lateral: 1.55 cm
Weight: 3648 oz

## 2022-07-10 MED ORDER — PERFLUTREN LIPID MICROSPHERE
1.0000 mL | INTRAVENOUS | Status: AC | PRN
  Administered 2022-07-10: 2 mL via INTRAVENOUS

## 2022-07-10 MED ORDER — TECHNETIUM TC 99M TETROFOSMIN IV KIT
9.5000 | PACK | Freq: Once | INTRAVENOUS | Status: AC | PRN
  Administered 2022-07-10: 9.5 via INTRAVENOUS

## 2022-07-10 MED ORDER — REGADENOSON 0.4 MG/5ML IV SOLN
0.4000 mg | Freq: Once | INTRAVENOUS | Status: AC
  Administered 2022-07-10: 0.4 mg via INTRAVENOUS

## 2022-07-10 MED ORDER — TECHNETIUM TC 99M TETROFOSMIN IV KIT
30.2000 | PACK | Freq: Once | INTRAVENOUS | Status: AC | PRN
  Administered 2022-07-10: 30.2 via INTRAVENOUS

## 2022-07-16 ENCOUNTER — Other Ambulatory Visit: Payer: Self-pay | Admitting: Internal Medicine

## 2022-07-16 DIAGNOSIS — E118 Type 2 diabetes mellitus with unspecified complications: Secondary | ICD-10-CM

## 2022-07-16 DIAGNOSIS — E119 Type 2 diabetes mellitus without complications: Secondary | ICD-10-CM

## 2022-07-16 MED ORDER — SOLIQUA 100-33 UNT-MCG/ML ~~LOC~~ SOPN: 60.0000 [IU] | PEN_INJECTOR | Freq: Every day | SUBCUTANEOUS | 1 refills | Status: DC

## 2022-07-16 NOTE — Progress Notes (Unsigned)
Lab Results  Component Value Date   WBC 5.7 06/25/2022   HGB 11.8 (L) 06/25/2022   HCT 37.4 (L) 06/25/2022   PLT 244.0 06/25/2022   GLUCOSE 140 (H) 06/25/2022   CHOL 93 06/25/2022   TRIG 112.0 06/25/2022   HDL 28.10 (L) 06/25/2022   LDLDIRECT 82.7 04/04/2013   LDLCALC 43 06/25/2022   ALT 11 06/25/2022   AST 13 06/25/2022   NA 139 06/25/2022   K 4.2 06/25/2022   CL 104 06/25/2022   CREATININE 1.29 06/25/2022   BUN 15 06/25/2022   CO2 26 06/25/2022   TSH 1.29 06/25/2022   PSA 2.75 06/25/2022   INR 0.95 08/17/2010   HGBA1C 7.2 (H) 06/25/2022   MICROALBUR 1.0 06/25/2022

## 2022-07-21 ENCOUNTER — Telehealth: Payer: Self-pay | Admitting: Internal Medicine

## 2022-07-21 NOTE — Telephone Encounter (Signed)
LVM for pt to rtn my call to schedule AWV with [facility] call back # [phone] 

## 2022-07-22 ENCOUNTER — Other Ambulatory Visit: Payer: Self-pay | Admitting: Internal Medicine

## 2022-07-22 DIAGNOSIS — J301 Allergic rhinitis due to pollen: Secondary | ICD-10-CM

## 2022-08-11 DIAGNOSIS — G4733 Obstructive sleep apnea (adult) (pediatric): Secondary | ICD-10-CM | POA: Diagnosis not present

## 2022-08-15 DIAGNOSIS — Z4420 Encounter for fitting and adjustment of artificial eye, unspecified: Secondary | ICD-10-CM | POA: Diagnosis not present

## 2022-08-21 ENCOUNTER — Other Ambulatory Visit: Payer: Self-pay | Admitting: Internal Medicine

## 2022-08-21 DIAGNOSIS — E118 Type 2 diabetes mellitus with unspecified complications: Secondary | ICD-10-CM

## 2022-08-23 ENCOUNTER — Other Ambulatory Visit: Payer: Self-pay | Admitting: Internal Medicine

## 2022-08-23 DIAGNOSIS — K219 Gastro-esophageal reflux disease without esophagitis: Secondary | ICD-10-CM

## 2022-09-03 ENCOUNTER — Telehealth: Payer: Self-pay | Admitting: Licensed Clinical Social Worker

## 2022-09-03 NOTE — Patient Outreach (Signed)
  Care Coordination   09/03/2022 Name: [name] MRN: [id number] DOB: Jun 23, 1951   Care Coordination Outreach Attempts:  An unsuccessful telephone outreach was attempted today to offer the patient information about available care coordination services as a benefit of their health plan.   Follow Up Plan:  Additional outreach attempts will be made to offer the patient care coordination information and services.   Encounter Outcome:  No Answer  Care Coordination Interventions Activated:  No   Care Coordination Interventions:  No, not indicated    Casimer Lanius, [place] [phone]

## 2022-09-08 ENCOUNTER — Other Ambulatory Visit: Payer: Self-pay | Admitting: Internal Medicine

## 2022-09-08 ENCOUNTER — Telehealth: Payer: Self-pay | Admitting: Licensed Clinical Social Worker

## 2022-09-08 DIAGNOSIS — I5042 Chronic combined systolic (congestive) and diastolic (congestive) heart failure: Secondary | ICD-10-CM

## 2022-09-08 DIAGNOSIS — E118 Type 2 diabetes mellitus with unspecified complications: Secondary | ICD-10-CM

## 2022-09-08 NOTE — Patient Outreach (Signed)
  Care Coordination   09/08/2022 Name: NAYQUAN EVINGER MRN: [id number] DOB: 04-27-1951   Care Coordination Outreach Attempts:  A second unsuccessful outreach was attempted today to offer the patient with information about available care coordination services as a benefit of their health plan.     Follow Up Plan:  Additional outreach attempts will be made to offer the patient care coordination information and services.   Encounter Outcome:  No Answer  Care Coordination Interventions Activated:  No   Care Coordination Interventions:  No, not indicated    Casimer Lanius, [place] [phone]

## 2022-09-09 ENCOUNTER — Other Ambulatory Visit: Payer: Self-pay | Admitting: Internal Medicine

## 2022-09-09 DIAGNOSIS — I2510 Atherosclerotic heart disease of native coronary artery without angina pectoris: Secondary | ICD-10-CM

## 2022-09-09 DIAGNOSIS — I5042 Chronic combined systolic (congestive) and diastolic (congestive) heart failure: Secondary | ICD-10-CM

## 2022-09-12 ENCOUNTER — Encounter: Payer: Self-pay | Admitting: *Deleted

## 2022-09-12 ENCOUNTER — Telehealth: Payer: Self-pay | Admitting: *Deleted

## 2022-09-12 NOTE — Patient Outreach (Signed)
  Care Coordination   09/12/2022 Name: [name] MRN: [id number] DOB: [birth year]   Care Coordination Outreach Attempts:  A third unsuccessful outreach was attempted today to offer the patient with information about available care coordination services as a benefit of their health plan.   Follow Up Plan:  No further outreach attempts will be made at this time. We have been unable to contact the patient to offer or enroll patient in care coordination services  Encounter Outcome:  No Answer- attempted calls x 2 to patient- on second outreach to alternate phone number, his spouse requested that re-try call "in about an hour;" re-attempted call again x 2 as requested, both unsuccessful  Care Coordination Interventions Activated:  No   Care Coordination Interventions:  No, not indicated unsuccessful outreach attempt # [place], RN, BSN, CCRN Alumnus RN CM Care Coordination/ [facility] [phone]: direct office

## 2022-10-01 ENCOUNTER — Encounter: Payer: Self-pay | Admitting: Podiatry

## 2022-10-01 ENCOUNTER — Ambulatory Visit (INDEPENDENT_AMBULATORY_CARE_PROVIDER_SITE_OTHER): Payer: PPO | Admitting: Podiatry

## 2022-10-01 DIAGNOSIS — B351 Tinea unguium: Secondary | ICD-10-CM

## 2022-10-01 DIAGNOSIS — M79609 Pain in unspecified limb: Secondary | ICD-10-CM

## 2022-10-01 DIAGNOSIS — E1142 Type 2 diabetes mellitus with diabetic polyneuropathy: Secondary | ICD-10-CM

## 2022-10-05 NOTE — Progress Notes (Signed)
  Subjective:  Patient ID: [name], male    DOB: July 28, 1951,  MRN: [id number]  [name] presents to clinic today for:  Chief Complaint  Patient presents with   Nail Problem    Diabetic foot care BS-did not check today A1C-8.2 PCP-[facility] PCP [facility]- Few months ago   New problem(s): None.   PCP is [name], MD , and last visit was  June 30, 2022.  Allergies  Allergen Reactions   Furosemide     pancreatitis   Olmesartan Other (See Comments)    hyperkalemia   Amlodipine Swelling    LE EDEMA   Diamox [Acetazolamide] Itching    hives   Lisinopril Rash    Review of Systems: Negative except as noted in the HPI.  Objective: No changes noted in today's physical examination.  [name] is a pleasant 71 y.o. male obese in NAD. AAO x 3. Vascular Examination: Capillary fill time to digits <3 seconds b/l lower extremities. Faintly palpable DP pulse(s) b/l lower extremities. Nonpalpable PT pulse(s) b/l lower extremities. Pedal hair absent. Lower extremity skin temperature gradient within normal limits. No pain with calf compression b/l. RLE edema of ankle.  Dermatological Examination: Pedal skin is thin shiny, atrophic b/l lower extremities. No open wounds b/l lower extremities No interdigital macerations b/l lower extremities Toenails 1-5 b/l elongated, discolored, dystrophic, thickened, crumbly with subungual debris and tenderness to dorsal palpation.  Musculoskeletal Examination: Muscle strength 3/5 to all LE muscle groups of left lower extremity. Muscle strength 2/5 to all LE muscle groups of right lower extremity. No pain crepitus or joint limitation noted with ROM b/l. No gross bony deformities bilaterally. Wearing appropriate fitting shoe gear. Utilizes cane for ambulation assistance.  Neurological Examination: Pt has subjective symptoms of neuropathy. Protective sensation diminished with 10g monofilament b/l. Vibratory sensation diminished  b/l.  Assessment/Plan: 1. Pain due to onychomycosis of nail   2. Diabetic peripheral neuropathy associated with type 2 diabetes mellitus ([facility])     No orders of the defined types were placed in this encounter.   -Consent given for treatment as described below: -Mycotic toenails 1-5 bilaterally were debrided in length and girth with sterile nail nippers and dremel without incident. -Patient/POA to call should there be question/concern in the interim.   Return in about 3 months (around 01/01/2023).  Marzetta Board, DPM

## 2022-11-04 ENCOUNTER — Ambulatory Visit: Payer: PPO | Attending: Cardiology | Admitting: Cardiology

## 2022-11-04 ENCOUNTER — Encounter: Payer: Self-pay | Admitting: Cardiology

## 2022-11-04 ENCOUNTER — Other Ambulatory Visit: Payer: Self-pay | Admitting: Internal Medicine

## 2022-11-04 VITALS — BP 150/80 | HR 62 | Ht 70.0 in | Wt 219.0 lb

## 2022-11-04 DIAGNOSIS — I25110 Atherosclerotic heart disease of native coronary artery with unstable angina pectoris: Secondary | ICD-10-CM

## 2022-11-04 DIAGNOSIS — E785 Hyperlipidemia, unspecified: Secondary | ICD-10-CM

## 2022-11-04 DIAGNOSIS — E7800 Pure hypercholesterolemia, unspecified: Secondary | ICD-10-CM

## 2022-11-04 DIAGNOSIS — I10 Essential (primary) hypertension: Secondary | ICD-10-CM

## 2022-11-04 DIAGNOSIS — I2510 Atherosclerotic heart disease of native coronary artery without angina pectoris: Secondary | ICD-10-CM

## 2022-11-04 NOTE — Progress Notes (Signed)
Cardiology Office Note:    Date:  11/04/2022   ID:  [name], DOB May 10, 1951, MRN [id number]  PCP:  Elijah Lima, MD  Cardiologist:  Elijah Furbish, MD  Electrophysiologist:  None   Referring MD: Elijah Lima, MD     History of Present Illness:    [name] is a 71 y.o. male here for follow-up of coronary artery disease prior stenting to left circumflex with concomitant diabetes polyneuropathy hypertension hyperlipidemia obesity blindness sleep apnea.  Cardiac catheterization back in 2008 demonstrated circumflex disease and he had PCI with bare-metal stent placement by Dr. Ilda [name] at the time.  Right coronary artery was managed medically and not favorable for PCI.  In August 2017 and nuclear stress test was performed and showed no ischemia.  Quite a dramatic weight loss in the past from 290 pounds.  Overall, weight is creeping up. It is something he had been continuously watching.  Looks like he had both irbesartan and losartan on his medicine list.  Vikki Ports was likely substituted for losartan during blackout period.  He would like to go back on losartan only.  We will refill.  He previously felt some chest discomfort shortness of breath when walking quickly or long distances.  This was manageable for him.  During a visit on 11/30/2019 for a coronary artery disease follow-up he was overall doing quite well.    Today, he is feeling overall well and denies any new complaints. He reports that the chest pain episodes he complained of during his last visit have significantly improved and attributes this to taking his medications. He has been compliant with all his medications. His LDL is 43.  He denies any palpitations, chest pain, shortness of breath, or peripheral edema. No lightheadedness, headaches, syncope, orthopnea, or PND.  Past Medical History:  Diagnosis Date   Arthritis    "mild arthritis in hip"   CAD (coronary artery disease)    CHF (congestive heart  failure) (HCC)    Complication of anesthesia    "lungs filled up with fluid"    Diabetes mellitus    Type II   Glaucoma    Heart murmur    HTN (hypertension)    Hypercholesterolemia    LBP (low back pain)    Legally blind    Peripheral neuropathy    Sleep apnea    c-pap nightly   Staph infection    "from back surgery"   Status post insertion of drug eluting coronary artery stent    Trigger finger    Ulcer    Wears glasses    "to protect cornea" - legally blind    Past Surgical History:  Procedure Laterality Date   CHOLECYSTECTOMY  09/15/2012   Procedure: LAPAROSCOPIC CHOLECYSTECTOMY WITH INTRAOPERATIVE CHOLANGIOGRAM;  Surgeon: Pedro Earls, MD;  Location: [facility];  Service: General;  Laterality: N/A;   COLONOSCOPY     EYE SURGERY Bilateral    cataracts   INGUINAL HERNIA REPAIR     right   LUMBAR FUSION     LUMBAR LAMINECTOMY/DECOMPRESSION MICRODISCECTOMY N/A 10/26/2015   Procedure: RIGHT AND CENTRAL LUMBAR LAMINECTOMY L3-4, RIGHT L5-S1 LATERAL RECESS DECOMPRESSION;  Surgeon: Jessy Oto, MD;  Location: [facility];  Service: Orthopedics;  Laterality: N/A;   NECK SURGERY     peptic ulcer dz surgery  pt was in his 78s   bleeding ulcer.    Prosthetic Cornea placement, Right eye  2007   [place]  Right 10/26/2015   Procedure: RELEASE TRIGGER FINGER RIGHT THUMB;  Surgeon: Jessy Oto, MD;  Location: [facility];  Service: Orthopedics;  Laterality: Right;    Current Medications: Current Meds  Medication Sig   atorvastatin (LIPITOR) 80 MG tablet TAKE 1 TABLET BY MOUTH DAILY AT 6 PM.   BD PEN NEEDLE NANO 2ND GEN 32G X 4 MM MISC USE WITH INSULIN PEN DAILY AS DIRECTED   carvedilol (COREG) 12.5 MG tablet Take one (1) tablet by mouth ( 12.5 mg ) twice daily.   Cholecalciferol (VITAMIN D3) 25 MCG (1000 UT) CAPS Take by mouth.   Continuous Blood Gluc Receiver (FREESTYLE LIBRE 14 DAY READER) DEVI APPLY AS DIRECTED   Continuous Blood Gluc Sensor (FREESTYLE  LIBRE 2 SENSOR) MISC USE 1 SENSOR EVERY 14 DAYS, THEN REPLACE WITH NEW ONE.   [facility] ASPIRIN LOW DOSE 81 MG tablet TAKE 1 TABLET (81 MG TOTAL) BY MOUTH DAILY. SWALLOW WHOLE.   fluticasone (FLONASE) 50 MCG/ACT nasal spray SPRAY 2 SPRAYS INTO EACH NOSTRIL EVERY DAY   gabapentin (NEURONTIN) 300 MG capsule TAKE 1 CAPSULE BY MOUTH THREE TIMES A DAY   Glucagon 1 MG/0.2ML SOAJ Inject 1 Act into the skin once as needed for up to 1 dose.   indapamide (LOZOL) 1.25 MG tablet Take 1.25 mg by mouth daily.   Insulin Glargine-Lixisenatide (SOLIQUA) 100-33 UNT-MCG/ML SOPN Inject 60 Units into the skin daily.   isosorbide mononitrate (IMDUR) 120 MG 24 hr tablet TAKE 1 TABLET BY MOUTH EVERY DAY   JARDIANCE 25 MG TABS tablet TAKE 1 TABLET (25 MG TOTAL) BY MOUTH DAILY.   levocetirizine (XYZAL) 5 MG tablet TAKE 1 TABLET BY MOUTH EVERY DAY IN THE EVENING   metFORMIN (GLUCOPHAGE) 500 MG tablet Take 1 tablet (500 mg total) by mouth 2 (two) times daily with a meal.   NEXLIZET 180-10 MG TABS TAKE 1 TABLET BY MOUTH DAILY   nitroGLYCERIN (NITROSTAT) 0.4 MG SL tablet Place 1 tablet (0.4 mg total) under the tongue every 5 (five) minutes as needed for chest pain. Do not exceed total of 3 dose in 15 minutes. Please make overdue appt with Dr. Marlou Porch before anymore refills. Thank you 1st attempt   omega-3 acid ethyl esters (LOVAZA) 1 g capsule TAKE 2 CAPSULES BY MOUTH TWICE A DAY   pantoprazole (PROTONIX) 40 MG tablet TAKE 1 TABLET BY MOUTH TWICE A DAY   ranolazine (RANEXA) 500 MG 12 hr tablet Take 1 tablet (500 mg total) by mouth 2 (two) times daily.   tamsulosin (FLOMAX) 0.4 MG CAPS capsule Take 0.4 mg by mouth once.   traMADol (ULTRAM) 50 MG tablet Take 1 tablet (50 mg total) by mouth every 8 (eight) hours as needed.     Allergies:   Furosemide, Olmesartan, Amlodipine, Diamox [acetazolamide], and Lisinopril   Social History   Socioeconomic History   Marital status: Married    Spouse name: [name]   Number of children: 3    Years of education: 12   Highest education level: Not on file  Occupational History   Occupation: disabled    Comment: blind  Tobacco Use   Smoking status: Never   Smokeless tobacco: Never  Vaping Use   Vaping Use: Never used  Substance and Sexual Activity   Alcohol use: No   Drug use: No   Sexual activity: Not Currently  Other Topics Concern   Not on file  Social History Narrative   Occupation: disabled, blind   Married   Regular Exercise-no  Lives at home with his wife.   Right-handed.   2-3 cups caffeine per day.   Social Determinants of Health   Financial Resource Strain: Low Risk  (03/04/2021)   Overall Financial Resource Strain (CARDIA)    Difficulty of Paying Living Expenses: Not hard at all  Food Insecurity: No Food Insecurity (03/04/2021)   Hunger Vital Sign    Worried About Running Out of Food in the Last Year: Never true    Ran Out of Food in the Last Year: Never true  Transportation Needs: Unmet Transportation Needs (03/04/2021)   PRAPARE - Hydrologist (Medical): Yes    Lack of Transportation (Non-Medical): Yes  Physical Activity: Inactive (03/04/2021)   Exercise Vital Sign    Days of Exercise per Week: 0 days    Minutes of Exercise per Session: 0 min  Stress: No Stress Concern Present (03/04/2021)   [place] of Stress : Not at all  Social Connections: [place] (03/04/2021)   Social Connection and Isolation Panel [NHANES]    Frequency of Communication with Friends and Family: More than three times a week    Frequency of Social Gatherings with Friends and Family: Once a week    Attends Religious Services: 1 to 4 times per year    Active Member of [employer] or Organizations: No    Attends Music therapist: 1 to 4 times per year    Marital Status: Married     Family History: The patient's family history includes Breast cancer in  his mother; Colon cancer in his mother and another family member; Diabetes in his brother, mother, and sister; Hypertension in his father and mother. There is no history of Esophageal cancer, Stomach cancer, or Rectal cancer.  ROS:   Please see the history of present illness.     All other systems reviewed and are negative.  EKGs/Labs/Other Studies Reviewed:    The following studies were reviewed today:  Echo 07/10/2022:  IMPRESSIONS    1. Left ventricular ejection fraction, by estimation, is 65 to 70%. The  left ventricle has normal function. The left ventricle has no regional  wall motion abnormalities. There is moderate asymmetric left ventricular  hypertrophy of the basal-septal  segment. Left ventricular diastolic parameters are consistent with Grade I  diastolic dysfunction (impaired relaxation).   2. Right ventricular systolic function is normal. The right ventricular  size is normal.   3. The mitral valve is grossly normal. Trivial mitral valve  regurgitation.   4. The aortic valve is grossly normal. Aortic valve regurgitation is  trivial.   5. Aortic dilatation noted. There is borderline dilatation of the  ascending aorta, measuring 38 mm.   6. The inferior vena cava is normal in size with greater than 50%  respiratory variability, suggesting right atrial pressure of 3 mmHg.   7. Cannot exclude a small PFO.   Comparison(s): Changes from prior study are noted. 07/10/2017: LVEF 60-65%,  mild MR.   Stress Test 07/10/2022:   The study is normal. The study is low risk.   Left ventricular function is normal. Nuclear stress EF: 61 %. The left ventricular ejection fraction is normal (55-65%). End diastolic cavity size is normal.   Prior study available for comparison from 05/17/2020  Lower Venous DVT 07/22/2018:  Right:  No evidence of deep vein thrombosis in the lower extremity.  No indirect evidence of obstruction proximal to the  inguinal ligament.   Left:  No evidence of  deep vein thrombosis in the lower extremity.  No indirect evidence of obstruction proximal to the inguinal ligament.    Echocardiogram 07/10/2017: - Left ventricle: The cavity size was normal. There was severe   concentric hypertrophy. Systolic function was normal. The   estimated ejection fraction was in the range of 60% to 65%. Wall   motion was normal; there were no regional wall motion   abnormalities. Doppler parameters are consistent with abnormal   left ventricular relaxation (grade 1 diastolic dysfunction).   Doppler parameters are consistent with elevated ventricular   end-diastolic filling pressure. - Aortic valve: Trileaflet; normal thickness leaflets. There was no   regurgitation. - Aortic root: The aortic root was normal in size. - Mitral valve: Structurally normal valve. There was mild   regurgitation. Valve area by pressure half-time: 1.29 cm^2. - Left atrium: The atrium was normal in size. - Right ventricle: Systolic function was normal. - Right atrium: The atrium was normal in size. - Tricuspid valve: There was no regurgitation. - Pulmonary arteries: Systolic pressure was within the normal   range. - Inferior vena cava: The vessel was normal in size. - Pericardium, extracardiac: There was no pericardial effusion.  Nuclear stress test 07/27/16:  - Low risk, no ischemia, ejection fraction 56%  Lower extremity Dopplers 07/22/2018: -No DVTs  EKG:  EKG is personally reviewed and interpreted.  11/04/2022: EKG was not ordered. 06/25/2022: The ekg ordered demonstrates normal sinus rhythm heart rate 72 bpm sinus rhythm first-degree AV block 220 ms low voltage QRS likely secondary to body habitus poor R wave progression personally reviewed and interpreted.  Recent Labs: 06/25/2022: ALT 11; BUN 15; Creatinine, Ser 1.29; Hemoglobin 11.8; Platelets 244.0; Potassium 4.2; Sodium 139; TSH 1.29  Recent Lipid Panel    Component Value Date/Time   CHOL 93 06/25/2022 1445   TRIG 112.0  06/25/2022 1445   HDL 28.10 (L) 06/25/2022 1445   CHOLHDL 3 06/25/2022 1445   VLDL 22.4 06/25/2022 1445   LDLCALC 43 06/25/2022 1445   LDLCALC 173 (H) 02/08/2020 1645   LDLDIRECT 82.7 04/04/2013 1451    Physical Exam:    VS:  BP (!) 150/80 (BP Location: Left Arm, Patient Position: Sitting, Cuff Size: Normal)   Pulse 62   Ht '5\' 10"'$  (1.778 m)   Wt 219 lb (99.3 kg)   SpO2 96%   BMI 31.42 kg/m     Wt Readings from Last 3 Encounters:  11/04/22 219 lb (99.3 kg)  07/10/22 228 lb (103.4 kg)  06/25/22 228 lb (103.4 kg)     GEN: Well nourished, well developed, in no acute distress, obese  HEENT: blind Neck: no JVD, carotid bruits, or masses Cardiac:  RRR; no murmurs, rubs, or gallops, mild LE edema  Respiratory:  clear to auscultation bilaterally, normal work of breathing GI: soft, nontender, nondistended, + BS MS: no deformity or atrophy  Skin: warm and dry, no rash Neuro:  Alert and Oriented x 3, Strength and sensation are intact Psych: euthymic mood, full affect   ASSESSMENT:    1. Coronary artery disease involving native coronary artery of native heart with unstable angina pectoris ([place])   2. Pure hypercholesterolemia   3. Primary hypertension     PLAN:    In order of problems listed above:  Coronary artery disease - Circumflex disease PCI 2008.  RCA managed medically. - Isosorbide high-dose, Ranexa (had some trouble with prior authorization in the past) nuclear stress  test 2017 was reassuring.  Echocardiogram 2018 showed normal EF.  Continue with current medications.  Continues to have no anginal symptoms.  Doing well.  Stable.  Continue with current medication management.   Diabetes with hyperlipidemia and hypertension - Overall [name] has been managing with excellent hemoglobin A1c most recently 7.2.  On Jardiance. -Bempedoic acid-Zetia, atorvastatin, Lovaza-LDL 43 triglycerides 112.  No myalgias.  Continue. -Blood pressure mildly elevated today.  Continue to  monitor closely.   Morbid obesity -BMI greater than 35 with 2 or more comorbidities.  Continue to encourage weight loss, decrease carbohydrates.  Done a good job overall with weight loss, continue.   Blindness -No changes, followed at [facility]. We will administer flu shot.  Follow up: 1 year -- no changes   Medication Adjustments/Labs and Tests Ordered: Current medicines are reviewed at length with the patient today.  Concerns regarding medicines are outlined above.  No orders of the defined types were placed in this encounter.  No orders of the defined types were placed in this encounter.  Patient Instructions  Medication Instructions:  Your physician recommends that you continue on your current medications as directed. Please refer to the Current Medication list given to you today.  *If you need a refill on your cardiac medications before your next appointment, please call your pharmacy*   Lab Work: NONE If you have labs (blood work) drawn today and your tests are completely normal, you will receive your results only by: [place] (if you have MyChart) OR A paper copy in the mail If you have any lab test that is abnormal or we need to change your treatment, we will call you to review the results.   Testing/Procedures: NONE   Follow-Up: At [facility], you and your health needs are our priority.  As part of our continuing mission to provide you with exceptional heart care, we have created designated Provider Care Teams.  These Care Teams include your primary Cardiologist (physician) and Advanced Practice Providers (APPs -  Physician Assistants and Nurse Practitioners) who all work together to provide you with the care you need, when you need it.  We recommend signing up for the patient portal called "MyChart".  Sign up information is provided on this After Visit Summary.  MyChart is used to connect with patients for Virtual Visits (Telemedicine).  Patients are able  to view lab/test results, encounter notes, upcoming appointments, etc.  Non-urgent messages can be sent to your provider as well.   To learn more about what you can do with MyChart, go to [email or web address].    Your next appointment:   1 year(s)  The format for your next appointment:   In Person  Provider:   Candee Furbish, MD     Important Information About Sugar           I,Rachel Rivera,acting as a scribe for Elijah Furbish, MD.,have documented all relevant documentation on the behalf of Elijah Furbish, MD,as directed by  Elijah Furbish, MD while in the presence of Elijah Furbish, MD.  I, Elijah Furbish, MD, have reviewed all documentation for this visit. The documentation on 11/04/22 for the exam, diagnosis, procedures, and orders are all accurate and complete.    Signed, Elijah Furbish, MD  11/04/2022 10:13 AM    [facility]

## 2022-11-04 NOTE — Patient Instructions (Signed)
Medication Instructions:  Your physician recommends that you continue on your current medications as directed. Please refer to the Current Medication list given to you today.  *If you need a refill on your cardiac medications before your next appointment, please call your pharmacy*   Lab Work: NONE If you have labs (blood work) drawn today and your tests are completely normal, you will receive your results only by: [place] (if you have MyChart) OR A paper copy in the mail If you have any lab test that is abnormal or we need to change your treatment, we will call you to review the results.   Testing/Procedures: NONE   Follow-Up: At [facility], you and your health needs are our priority.  As part of our continuing mission to provide you with exceptional heart care, we have created designated Provider Care Teams.  These Care Teams include your primary Cardiologist (physician) and Advanced Practice Providers (APPs -  Physician Assistants and Nurse Practitioners) who all work together to provide you with the care you need, when you need it.  We recommend signing up for the patient portal called "MyChart".  Sign up information is provided on this After Visit Summary.  MyChart is used to connect with patients for Virtual Visits (Telemedicine).  Patients are able to view lab/test results, encounter notes, upcoming appointments, etc.  Non-urgent messages can be sent to your provider as well.   To learn more about what you can do with MyChart, go to [email or web address].    Your next appointment:   1 year(s)  The format for your next appointment:   In Person  Provider:   Candee Furbish, MD     Important Information About Sugar

## 2022-11-10 ENCOUNTER — Other Ambulatory Visit: Payer: Self-pay | Admitting: Internal Medicine

## 2022-11-10 DIAGNOSIS — K219 Gastro-esophageal reflux disease without esophagitis: Secondary | ICD-10-CM

## 2022-11-10 DIAGNOSIS — I2510 Atherosclerotic heart disease of native coronary artery without angina pectoris: Secondary | ICD-10-CM

## 2022-11-10 DIAGNOSIS — J301 Allergic rhinitis due to pollen: Secondary | ICD-10-CM

## 2022-11-10 DIAGNOSIS — E118 Type 2 diabetes mellitus with unspecified complications: Secondary | ICD-10-CM

## 2022-11-10 DIAGNOSIS — E781 Pure hyperglyceridemia: Secondary | ICD-10-CM

## 2022-11-11 ENCOUNTER — Other Ambulatory Visit: Payer: Self-pay | Admitting: Radiology

## 2022-11-11 MED ORDER — GABAPENTIN 300 MG PO CAPS: ORAL_CAPSULE | ORAL | 2 refills | Status: DC

## 2022-12-01 ENCOUNTER — Telehealth: Payer: Self-pay | Admitting: Internal Medicine

## 2022-12-01 NOTE — Telephone Encounter (Signed)
LVM for pt to rtn my call to schedule AWV with [facility] call back # [phone] 

## 2022-12-03 ENCOUNTER — Telehealth: Payer: Self-pay | Admitting: Internal Medicine

## 2022-12-03 ENCOUNTER — Other Ambulatory Visit: Payer: Self-pay | Admitting: Internal Medicine

## 2022-12-03 DIAGNOSIS — I2510 Atherosclerotic heart disease of native coronary artery without angina pectoris: Secondary | ICD-10-CM

## 2022-12-03 DIAGNOSIS — E785 Hyperlipidemia, unspecified: Secondary | ICD-10-CM

## 2022-12-03 NOTE — Telephone Encounter (Signed)
LVM for pt to rtn my call to schedule AWV with [facility] call back # [phone] 

## 2022-12-12 DIAGNOSIS — G4733 Obstructive sleep apnea (adult) (pediatric): Secondary | ICD-10-CM | POA: Diagnosis not present

## 2022-12-25 ENCOUNTER — Encounter: Payer: Self-pay | Admitting: *Deleted

## 2022-12-30 ENCOUNTER — Ambulatory Visit: Payer: PPO | Admitting: Internal Medicine

## 2023-01-12 ENCOUNTER — Other Ambulatory Visit: Payer: Self-pay | Admitting: Internal Medicine

## 2023-01-12 ENCOUNTER — Telehealth: Payer: Self-pay | Admitting: Internal Medicine

## 2023-01-12 DIAGNOSIS — I5042 Chronic combined systolic (congestive) and diastolic (congestive) heart failure: Secondary | ICD-10-CM

## 2023-01-12 DIAGNOSIS — E781 Pure hyperglyceridemia: Secondary | ICD-10-CM

## 2023-01-12 DIAGNOSIS — K219 Gastro-esophageal reflux disease without esophagitis: Secondary | ICD-10-CM

## 2023-01-12 DIAGNOSIS — E118 Type 2 diabetes mellitus with unspecified complications: Secondary | ICD-10-CM

## 2023-01-12 DIAGNOSIS — E119 Type 2 diabetes mellitus without complications: Secondary | ICD-10-CM | POA: Insufficient documentation

## 2023-01-12 DIAGNOSIS — Z794 Long term (current) use of insulin: Secondary | ICD-10-CM

## 2023-01-12 DIAGNOSIS — I2510 Atherosclerotic heart disease of native coronary artery without angina pectoris: Secondary | ICD-10-CM

## 2023-01-12 MED ORDER — BD PEN NEEDLE NANO 2ND GEN 32G X 4 MM MISC: 1.0000 | Freq: Every day | 3 refills | Status: DC

## 2023-01-12 MED ORDER — FREESTYLE LIBRE 2 READER DEVI: 1.0000 | Freq: Every day | 5 refills | Status: DC

## 2023-01-12 MED ORDER — FREESTYLE LIBRE 2 SENSOR MISC: 1.0000 | Freq: Every day | 5 refills | Status: DC

## 2023-01-12 MED ORDER — ICOSAPENT ETHYL 1 G PO CAPS: 2.0000 g | ORAL_CAPSULE | Freq: Two times a day (BID) | ORAL | 1 refills | Status: DC

## 2023-01-12 NOTE — Telephone Encounter (Signed)
Caller & Relationship to patient:  patient   Call back number:[phone]   Date of last office visit:   Date of next office visit:  01/20/2023   Medication(s) to be refilled:  Soliqua insulin, also needs freestyle libre reader & sensors #3 & needles        Preferred [facility]:   [facility] [address]

## 2023-01-13 ENCOUNTER — Other Ambulatory Visit: Payer: Self-pay | Admitting: Internal Medicine

## 2023-01-13 ENCOUNTER — Telehealth: Payer: Self-pay | Admitting: Internal Medicine

## 2023-01-13 DIAGNOSIS — E118 Type 2 diabetes mellitus with unspecified complications: Secondary | ICD-10-CM

## 2023-01-13 DIAGNOSIS — K219 Gastro-esophageal reflux disease without esophagitis: Secondary | ICD-10-CM

## 2023-01-13 DIAGNOSIS — E0849 Diabetes mellitus due to underlying condition with other diabetic neurological complication: Secondary | ICD-10-CM

## 2023-01-13 DIAGNOSIS — M961 Postlaminectomy syndrome, not elsewhere classified: Secondary | ICD-10-CM

## 2023-01-13 DIAGNOSIS — M5416 Radiculopathy, lumbar region: Secondary | ICD-10-CM

## 2023-01-13 DIAGNOSIS — E119 Type 2 diabetes mellitus without complications: Secondary | ICD-10-CM

## 2023-01-13 MED ORDER — FREESTYLE LIBRE 3 SENSOR MISC: 1.0000 | Freq: Every day | 5 refills | Status: DC

## 2023-01-13 MED ORDER — FREESTYLE LIBRE 3 READER DEVI: 1.0000 | Freq: Every day | 3 refills | Status: DC

## 2023-01-13 NOTE — Telephone Encounter (Signed)
Patient called needs a refill on his tramadol '50mg'$ , 90 day supply and his blood glucose sensor and reader says he needs the freestyle #3. [facility] [address].

## 2023-01-14 ENCOUNTER — Other Ambulatory Visit: Payer: Self-pay | Admitting: Internal Medicine

## 2023-01-16 ENCOUNTER — Encounter: Payer: Self-pay | Admitting: Internal Medicine

## 2023-01-17 ENCOUNTER — Other Ambulatory Visit: Payer: Self-pay | Admitting: Internal Medicine

## 2023-01-19 ENCOUNTER — Ambulatory Visit: Payer: Medicare PPO | Admitting: Podiatry

## 2023-01-20 ENCOUNTER — Encounter: Payer: Self-pay | Admitting: Internal Medicine

## 2023-01-20 ENCOUNTER — Ambulatory Visit (INDEPENDENT_AMBULATORY_CARE_PROVIDER_SITE_OTHER): Payer: Medicare PPO | Admitting: Internal Medicine

## 2023-01-20 VITALS — BP 142/78 | HR 62 | Temp 97.7°F | Resp 16 | Ht 70.0 in | Wt 218.0 lb

## 2023-01-20 DIAGNOSIS — Z794 Long term (current) use of insulin: Secondary | ICD-10-CM | POA: Diagnosis not present

## 2023-01-20 DIAGNOSIS — N182 Chronic kidney disease, stage 2 (mild): Secondary | ICD-10-CM | POA: Diagnosis not present

## 2023-01-20 DIAGNOSIS — D510 Vitamin B12 deficiency anemia due to intrinsic factor deficiency: Secondary | ICD-10-CM | POA: Diagnosis not present

## 2023-01-20 DIAGNOSIS — Z23 Encounter for immunization: Secondary | ICD-10-CM

## 2023-01-20 DIAGNOSIS — E119 Type 2 diabetes mellitus without complications: Secondary | ICD-10-CM | POA: Diagnosis not present

## 2023-01-20 DIAGNOSIS — I10 Essential (primary) hypertension: Secondary | ICD-10-CM

## 2023-01-20 DIAGNOSIS — G4733 Obstructive sleep apnea (adult) (pediatric): Secondary | ICD-10-CM | POA: Diagnosis not present

## 2023-01-20 LAB — CBC WITH DIFFERENTIAL/PLATELET
Basophils Absolute: 0 10*3/uL (ref 0.0–0.1)
Basophils Relative: 0.4 % (ref 0.0–3.0)
Eosinophils Absolute: 0.2 10*3/uL (ref 0.0–0.7)
Eosinophils Relative: 3.3 % (ref 0.0–5.0)
HCT: 37.3 % — ABNORMAL LOW (ref 39.0–52.0)
Hemoglobin: 12.1 g/dL — ABNORMAL LOW (ref 13.0–17.0)
Lymphocytes Relative: 36.8 % (ref 12.0–46.0)
Lymphs Abs: 2 10*3/uL (ref 0.7–4.0)
MCHC: 32.5 g/dL (ref 30.0–36.0)
MCV: 71.6 fl — ABNORMAL LOW (ref 78.0–100.0)
Monocytes Absolute: 0.5 10*3/uL (ref 0.1–1.0)
Monocytes Relative: 9.6 % (ref 3.0–12.0)
Neutro Abs: 2.7 10*3/uL (ref 1.4–7.7)
Neutrophils Relative %: 49.9 % (ref 43.0–77.0)
Platelets: 241 10*3/uL (ref 150.0–400.0)
RBC: 5.22 Mil/uL (ref 4.22–5.81)
RDW: 16.3 % — ABNORMAL HIGH (ref 11.5–15.5)
WBC: 5.5 10*3/uL (ref 4.0–10.5)

## 2023-01-20 LAB — BASIC METABOLIC PANEL
BUN: 16 mg/dL (ref 6–23)
CO2: 27 mEq/L (ref 19–32)
Calcium: 8.6 mg/dL (ref 8.4–10.5)
Chloride: 101 mEq/L (ref 96–112)
Creatinine, Ser: 1.04 mg/dL (ref 0.40–1.50)
GFR: 71.28 mL/min (ref >60.00)
Glucose, Bld: 117 mg/dL — ABNORMAL HIGH (ref 70–99)
Potassium: 3.9 mEq/L (ref 3.5–5.1)
Sodium: 137 mEq/L (ref 135–145)

## 2023-01-20 LAB — HEMOGLOBIN A1C: Hgb A1c MFr Bld: 6.8 % — ABNORMAL HIGH (ref 4.6–6.5)

## 2023-01-20 LAB — FOLATE: Folate: 14.9 ng/mL (ref >5.9)

## 2023-01-20 MED ORDER — CYANOCOBALAMIN 1000 MCG/ML IJ SOLN
1000.0000 ug | Freq: Once | INTRAMUSCULAR | Status: AC
  Administered 2023-01-20: 1000 ug via INTRAMUSCULAR

## 2023-01-20 NOTE — Patient Instructions (Signed)

## 2023-01-20 NOTE — Progress Notes (Signed)
Subjective:  Patient ID: [name], male    DOB: [birth year]  Age: 72 y.o. MRN: [id number]  CC: Anemia, Diabetes, Hypertension, and Hyperlipidemia   HPI [name] presents for f/up -    He denies chest pain, shortness of breath, diaphoresis, or edema.  He complains of throat clearing but denies odynophagia or dysphagia.  He tells me his ataxia is getting worse.  Outpatient Medications Prior to Visit  Medication Sig Dispense Refill   ASPIRIN LOW DOSE 81 MG tablet TAKE 1 TABLET (81 MG TOTAL) BY MOUTH DAILY. SWALLOW WHOLE. 90 tablet 1   atorvastatin (LIPITOR) 80 MG tablet TAKE 1 TABLET BY MOUTH DAILY AT 6 PM. 90 tablet 1   Bempedoic Acid-Ezetimibe (NEXLIZET) 180-10 MG TABS TAKE 1 TABLET BY MOUTH DAILY 90 tablet 0   carvedilol (COREG) 12.5 MG tablet Take one (1) tablet by mouth ( 12.5 mg ) twice daily. 180 tablet 2   Cholecalciferol (VITAMIN D3) 25 MCG (1000 UT) CAPS Take by mouth.     Continuous Blood Gluc Receiver (FREESTYLE LIBRE 3 READER) DEVI 1 Act by Does not apply route daily. 1 each 3   Continuous Blood Gluc Sensor (FREESTYLE LIBRE 3 SENSOR) MISC 1 Act by Does not apply route daily. Place 1 sensor on the skin every 14 days. Use to check glucose continuously 2 each 5   fluticasone (FLONASE) 50 MCG/ACT nasal spray SPRAY 2 SPRAYS INTO EACH NOSTRIL EVERY DAY 48 mL 3   gabapentin (NEURONTIN) 300 MG capsule Take  capsule three times a day 270 capsule 2   Glucagon 1 MG/0.2ML SOAJ Inject 1 Act into the skin once as needed for up to 1 dose. 0.2 mL 3   icosapent Ethyl (VASCEPA) 1 g capsule Take 2 capsules (2 g total) by mouth 2 (two) times daily. 360 capsule 1   indapamide (LOZOL) 1.25 MG tablet Take 1.25 mg by mouth daily.     Insulin Glargine-Lixisenatide (SOLIQUA) 100-33 UNT-MCG/ML SOPN INJECT 60 UNITS INTO THE SKIN DAILY 54 mL 0   Insulin Pen Needle (BD PEN NEEDLE NANO 2ND GEN) 32G X 4 MM MISC Inject 1 Act into the skin daily. 100 each 3   isosorbide mononitrate (IMDUR) 120 MG 24 hr  tablet TAKE 1 TABLET BY MOUTH EVERY DAY 90 tablet 1   JARDIANCE 25 MG TABS tablet TAKE 1 TABLET (25 MG TOTAL) BY MOUTH DAILY. 90 tablet 1   levocetirizine (XYZAL) 5 MG tablet TAKE 1 TABLET BY MOUTH EVERY DAY IN THE EVENING 90 tablet 3   metFORMIN (GLUCOPHAGE) 500 MG tablet TAKE 1 TABLET BY MOUTH TWICE A DAY WITH FOOD 180 tablet 0   nitroGLYCERIN (NITROSTAT) 0.4 MG SL tablet Place 1 tablet (0.4 mg total) under the tongue every 5 (five) minutes as needed for chest pain. Do not exceed total of 3 dose in 15 minutes. Please make overdue appt with Dr. Marlou Porch before anymore refills. Thank you 1st attempt 25 tablet 0   pantoprazole (PROTONIX) 40 MG tablet TAKE 1 TABLET BY MOUTH TWICE A DAY 180 tablet 0   ranolazine (RANEXA) 500 MG 12 hr tablet TAKE 1 TABLET BY MOUTH TWICE A DAY 180 tablet 0   tamsulosin (FLOMAX) 0.4 MG CAPS capsule Take 0.4 mg by mouth once.     traMADol (ULTRAM) 50 MG tablet TAKE 1 TABLET BY MOUTH EVERY 8 HOURS AS NEEDED. 270 tablet 0   No facility-administered medications prior to visit.    ROS Review of Systems  Constitutional: Negative.  Negative for chills, diaphoresis, fatigue, fever and unexpected weight change.  HENT: Negative.  Negative for sore throat and trouble swallowing.   Respiratory:  Positive for apnea. Negative for cough, chest tightness, shortness of breath and wheezing.   Cardiovascular:  Negative for chest pain, palpitations and leg swelling.  Gastrointestinal:  Negative for abdominal pain, constipation, diarrhea, nausea and vomiting.  Endocrine: Negative.   Genitourinary: Negative.  Negative for difficulty urinating.  Musculoskeletal:  Positive for arthralgias and gait problem. Negative for myalgias.  Skin: Negative.   Neurological:  Negative for dizziness, weakness, light-headedness and headaches.  Hematological:  Negative for adenopathy. Does not bruise/bleed easily.  Psychiatric/Behavioral: Negative.      Objective:  BP (!) 142/78 (BP Location: Left  Arm, Patient Position: Sitting, Cuff Size: Normal)   Pulse 62   Temp 97.7 F (36.5 C) (Oral)   Resp 16   Ht '5\' 10"'$  (1.778 m)   Wt 218 lb (98.9 kg)   SpO2 99%   BMI 31.28 kg/m   BP Readings from Last 3 Encounters:  01/21/23 (!) 148/67  01/20/23 (!) 142/78  11/04/22 (!) 150/80    Wt Readings from Last 3 Encounters:  01/20/23 218 lb (98.9 kg)  11/04/22 219 lb (99.3 kg)  07/10/22 228 lb (103.4 kg)    Physical Exam Vitals reviewed.  Constitutional:      Appearance: He is not ill-appearing.  HENT:     Nose: Nose normal.     Mouth/Throat:     Mouth: Mucous membranes are moist.  Eyes:     General: No scleral icterus.    Conjunctiva/sclera: Conjunctivae normal.  Cardiovascular:     Rate and Rhythm: Normal rate and regular rhythm.     Heart sounds: No murmur heard. Pulmonary:     Effort: Pulmonary effort is normal.     Breath sounds: No stridor. No wheezing, rhonchi or rales.  Abdominal:     General: Abdomen is flat.     Palpations: There is no mass.     Tenderness: There is no abdominal tenderness. There is no guarding.     Hernia: No hernia is present.  Musculoskeletal:        General: Normal range of motion.     Cervical back: Neck supple.     Right lower leg: No edema.     Left lower leg: No edema.  Lymphadenopathy:     Cervical: No cervical adenopathy.  Skin:    General: Skin is warm and dry.  Neurological:     Mental Status: He is alert. Mental status is at baseline.     Motor: Weakness present.     Gait: Gait abnormal.     Deep Tendon Reflexes: Reflexes abnormal.  Psychiatric:        Mood and Affect: Mood normal.        Behavior: Behavior normal.     Lab Results  Component Value Date   WBC 5.5 01/20/2023   HGB 12.1 (L) 01/20/2023   HCT 37.3 (L) 01/20/2023   PLT 241.0 01/20/2023   GLUCOSE 117 (H) 01/20/2023   CHOL 93 06/25/2022   TRIG 112.0 06/25/2022   HDL 28.10 (L) 06/25/2022   LDLDIRECT 82.7 04/04/2013   LDLCALC 43 06/25/2022   ALT 11  06/25/2022   AST 13 06/25/2022   NA 137 01/20/2023   K 3.9 01/20/2023   CL 101 01/20/2023   CREATININE 1.04 01/20/2023   BUN 16 01/20/2023   CO2 27 01/20/2023   TSH 1.29 06/25/2022  PSA 2.75 06/25/2022   INR 0.95 08/17/2010   HGBA1C 6.8 (H) 01/20/2023   MICROALBUR 1.0 06/25/2022    VAS [place] LOWER EXTREMITY VENOUS (DVT)  Result Date: 07/23/2018  Lower Venous DVT Study Indications: Edema, bilaterally. Denies SOB.  Risk Factors: None identified. Performing Technologist: Sharlett Iles RVT  Examination Guidelines: A complete evaluation includes B-mode imaging, spectral Doppler, color Doppler, and power Doppler as needed of all accessible portions of each vessel. Bilateral testing is considered an integral part of a complete examination. Limited examinations for reoccurring indications may be performed as noted. The reflux portion of the exam is performed with the patient in reverse Trendelenburg.  Right Venous Findings: +---------+---------------+---------+-----------+----------+-------+          CompressibilityPhasicitySpontaneityPropertiesSummary +---------+---------------+---------+-----------+----------+-------+ CFV      Full           Yes      Yes                          +---------+---------------+---------+-----------+----------+-------+ SFJ      Full           Yes      Yes                          +---------+---------------+---------+-----------+----------+-------+ FV Prox  Full           Yes      Yes                          +---------+---------------+---------+-----------+----------+-------+ FV Mid   Full                                                 +---------+---------------+---------+-----------+----------+-------+ FV DistalFull           Yes      Yes                          +---------+---------------+---------+-----------+----------+-------+ PFV      Full           Yes      Yes                           +---------+---------------+---------+-----------+----------+-------+ POP      Full           Yes      Yes                          +---------+---------------+---------+-----------+----------+-------+ PTV      Full           Yes      No                           +---------+---------------+---------+-----------+----------+-------+ PERO     Full           Yes      No                           +---------+---------------+---------+-----------+----------+-------+ Gastroc  Full                                                 +---------+---------------+---------+-----------+----------+-------+  GSV      Full           Yes      Yes                          +---------+---------------+---------+-----------+----------+-------+  Left Venous Findings: +---------+---------------+---------+-----------+----------+-------+          CompressibilityPhasicitySpontaneityPropertiesSummary +---------+---------------+---------+-----------+----------+-------+ CFV      Full           Yes      Yes                          +---------+---------------+---------+-----------+----------+-------+ SFJ      Full           Yes      Yes                          +---------+---------------+---------+-----------+----------+-------+ FV Prox  Full           Yes      Yes                          +---------+---------------+---------+-----------+----------+-------+ FV Mid   Full                                                 +---------+---------------+---------+-----------+----------+-------+ FV DistalFull           Yes      Yes                          +---------+---------------+---------+-----------+----------+-------+ PFV      Full           Yes      Yes                          +---------+---------------+---------+-----------+----------+-------+ POP      Full           Yes      Yes                           +---------+---------------+---------+-----------+----------+-------+ PTV      Full           Yes      No                           +---------+---------------+---------+-----------+----------+-------+ PERO     Full           Yes      No                           +---------+---------------+---------+-----------+----------+-------+ Gastroc  Full                                                 +---------+---------------+---------+-----------+----------+-------+ GSV      Full           Yes      Yes                          +---------+---------------+---------+-----------+----------+-------+  Final Interpretation: Right: No evidence of deep vein thrombosis in the lower extremity. No indirect evidence of obstruction proximal to the inguinal ligament. Left: No evidence of deep vein thrombosis in the lower extremity. No indirect evidence of obstruction proximal to the inguinal ligament.  *See table(s) above for measurements and observations. Electronically signed by Ida Rogue MD on 07/23/2018 at 7:37:05 PM.    Final     Assessment & Plan:   [name] was seen today for anemia, diabetes, hypertension and hyperlipidemia.  Diagnoses and all orders for this visit:  Primary hypertension- His blood pressure is well-controlled. -     Basic metabolic panel; Future -     Basic metabolic panel  Insulin-requiring or dependent type II diabetes mellitus ([place])- His blood sugar is adequately well-controlled. -     Basic metabolic panel; Future -     Hemoglobin A1c; Future -     Hemoglobin A1c -     Basic metabolic panel  CKD (chronic kidney disease) stage 2, GFR 60-89 ml/min- His renal function continues to improve. 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   I am having [name] start on Shingrix. I am also having him maintain his Vitamin D3, tamsulosin, indapamide, nitroGLYCERIN, Glucagon, carvedilol, fluticasone, Nexlizet, levocetirizine, gabapentin, atorvastatin, Soliqua, Jardiance, ranolazine, isosorbide mononitrate, Aspirin Low Dose, icosapent Ethyl, BD Pen Needle Nano 2nd Gen, metFORMIN, traMADol, pantoprazole, FreeStyle Libre 3 Sensor, and [employer] 3 Reader. We administered cyanocobalamin.  Meds ordered this encounter  Medications   cyanocobalamin (VITAMIN B12) injection 1,000 mcg   Zoster Vaccine Adjuvanted [facility]) injection    Sig: Inject 0.5 mLs into the muscle once for 1 dose.    Dispense:  0.5 mL    Refill:  1     Follow-up: Return in about 6 months (around 07/21/2023).  [name], MD

## 2023-01-21 ENCOUNTER — Encounter: Payer: Self-pay | Admitting: Podiatry

## 2023-01-21 ENCOUNTER — Ambulatory Visit (INDEPENDENT_AMBULATORY_CARE_PROVIDER_SITE_OTHER): Payer: Medicare PPO | Admitting: Podiatry

## 2023-01-21 VITALS — BP 148/67

## 2023-01-21 DIAGNOSIS — M79676 Pain in unspecified toe(s): Secondary | ICD-10-CM | POA: Diagnosis not present

## 2023-01-21 DIAGNOSIS — B351 Tinea unguium: Secondary | ICD-10-CM

## 2023-01-21 DIAGNOSIS — E1142 Type 2 diabetes mellitus with diabetic polyneuropathy: Secondary | ICD-10-CM | POA: Diagnosis not present

## 2023-01-21 NOTE — Progress Notes (Signed)
  Subjective:  Patient ID: [name], male    DOB: June 16, 1951,  MRN: [id number]  [name] presents to clinic today for at risk foot care with history of diabetic neuropathy and painful thick toenails that are difficult to trim. Pain interferes with ambulation. Aggravating factors include wearing enclosed shoe gear. Pain is relieved with periodic professional debridement.  Chief Complaint  Patient presents with   Nail Problem     Routine foot care   His wife is present during today's visit.  New problem(s): None.   PCP is [name], MD.  Allergies  Allergen Reactions   Furosemide     pancreatitis   Olmesartan Other (See Comments)    hyperkalemia   Amlodipine Swelling    LE EDEMA   Diamox [Acetazolamide] Itching    hives   Lisinopril Rash    Review of Systems: Negative except as noted in the HPI.  Objective: No changes noted in today's physical examination. Vitals:   01/21/23 0915  BP: (!) 148/67   [name] is a pleasant 72 y.o. male morbidly obese in NAD. AAO x 3.  Vascular Examination: Capillary fill time to digits <3 seconds b/l lower extremities. Faintly palpable DP pulse(s) b/l lower extremities. Nonpalpable PT pulse(s) b/l lower extremities. Pedal hair absent. Lower extremity skin temperature gradient within normal limits. No pain with calf compression b/l. RLE edema of ankle.  Dermatological Examination: Pedal skin is thin shiny, atrophic b/l lower extremities. No open wounds b/l lower extremities No interdigital macerations b/l lower extremities Toenails 1-5 b/l elongated, discolored, dystrophic, thickened, crumbly with subungual debris and tenderness to dorsal palpation.  Musculoskeletal Examination: Muscle strength 3/5 to all LE muscle groups of left lower extremity. Muscle strength 2/5 to all LE muscle groups of right lower extremity. No pain crepitus or joint limitation noted with ROM b/l. No gross bony deformities bilaterally. Wearing  appropriate fitting shoe gear. Utilizes cane for ambulation assistance.  Neurological Examination: Pt has subjective symptoms of neuropathy. Protective sensation diminished with 10g monofilament b/l. Vibratory sensation diminished b/l.  Assessment/Plan: 1. Pain due to onychomycosis of nail   2. Diabetic peripheral neuropathy associated with type 2 diabetes mellitus ([place])     No orders of the defined types were placed in this encounter.  -Patient's family member present. All questions/concerns addressed on today's visit. -Examined patient. -Patient to continue soft, supportive shoe gear daily. -Toenails 1-5 b/l were debrided in length and girth with sterile nail nippers and dremel without iatrogenic bleeding.  -Patient/POA to call should there be question/concern in the interim.   Return in about 3 months (around 04/21/2023).  Marzetta Board, DPM

## 2023-01-22 MED ORDER — SHINGRIX 50 MCG/0.5ML IM SUSR: 0.5000 mL | Freq: Once | INTRAMUSCULAR | 1 refills | Status: AC

## 2023-01-23 ENCOUNTER — Other Ambulatory Visit (HOSPITAL_COMMUNITY): Payer: Self-pay

## 2023-01-23 ENCOUNTER — Telehealth: Payer: Self-pay

## 2023-01-23 ENCOUNTER — Encounter: Payer: Self-pay | Admitting: Internal Medicine

## 2023-01-23 NOTE — Telephone Encounter (Signed)
Vania Rea with [employer] has called and stated that the pt is needing Diabetic Supplies order FreeStyle [place] 3 sensor and reader needs to be sent to [facility].

## 2023-01-24 DIAGNOSIS — Z23 Encounter for immunization: Secondary | ICD-10-CM | POA: Insufficient documentation

## 2023-01-24 DIAGNOSIS — G4733 Obstructive sleep apnea (adult) (pediatric): Secondary | ICD-10-CM | POA: Insufficient documentation

## 2023-01-27 ENCOUNTER — Other Ambulatory Visit: Payer: Self-pay

## 2023-01-27 DIAGNOSIS — I5022 Chronic systolic (congestive) heart failure: Secondary | ICD-10-CM

## 2023-01-27 MED ORDER — CARVEDILOL 12.5 MG PO TABS: ORAL_TABLET | ORAL | 2 refills | Status: DC

## 2023-01-27 NOTE — Telephone Encounter (Signed)
Iona Coach, Damiana Berrian, [place]; Verne Carrow; Minus Liberty We do not provide them. Thank you! We have a email we provide to pt in question about them. That email is [email or web address]       Previous Messages    ----- Message ----- From: Jari Pigg, CMA Sent: 01/27/2023   9:51 AM EST To: Darlina Guys; Nash Shearer; Minus Liberty  Morning-  This pt is requesting that a Freestyle Libre be sent to [facility]. I wanted to verify if that it something that can be sent. Please advise. Thank you!  Nairi Oswald, CMA

## 2023-01-27 NOTE — Telephone Encounter (Signed)
I have sent the [employer] team a community message to inquire if that is something that can be processed as a DME order.

## 2023-01-29 ENCOUNTER — Other Ambulatory Visit: Payer: Self-pay | Admitting: Internal Medicine

## 2023-01-29 ENCOUNTER — Other Ambulatory Visit: Payer: Self-pay

## 2023-01-29 DIAGNOSIS — E119 Type 2 diabetes mellitus without complications: Secondary | ICD-10-CM

## 2023-01-29 DIAGNOSIS — I5022 Chronic systolic (congestive) heart failure: Secondary | ICD-10-CM

## 2023-01-29 MED ORDER — FREESTYLE LIBRE 3 SENSOR MISC: 1.0000 | Freq: Every day | 5 refills | Status: DC

## 2023-01-29 MED ORDER — FREESTYLE LIBRE 3 READER DEVI: 1.0000 | Freq: Every day | 3 refills | Status: AC

## 2023-01-29 MED ORDER — CARVEDILOL 12.5 MG PO TABS: ORAL_TABLET | ORAL | 3 refills | Status: DC

## 2023-01-30 ENCOUNTER — Other Ambulatory Visit: Payer: Self-pay | Admitting: Internal Medicine

## 2023-01-30 ENCOUNTER — Encounter: Payer: Self-pay | Admitting: Internal Medicine

## 2023-01-30 DIAGNOSIS — I2510 Atherosclerotic heart disease of native coronary artery without angina pectoris: Secondary | ICD-10-CM

## 2023-02-03 ENCOUNTER — Other Ambulatory Visit: Payer: Self-pay | Admitting: Internal Medicine

## 2023-02-03 ENCOUNTER — Other Ambulatory Visit: Payer: Self-pay

## 2023-02-03 DIAGNOSIS — I2510 Atherosclerotic heart disease of native coronary artery without angina pectoris: Secondary | ICD-10-CM

## 2023-02-03 DIAGNOSIS — Z794 Long term (current) use of insulin: Secondary | ICD-10-CM

## 2023-02-03 DIAGNOSIS — E785 Hyperlipidemia, unspecified: Secondary | ICD-10-CM

## 2023-02-03 DIAGNOSIS — E118 Type 2 diabetes mellitus with unspecified complications: Secondary | ICD-10-CM

## 2023-02-03 DIAGNOSIS — K219 Gastro-esophageal reflux disease without esophagitis: Secondary | ICD-10-CM

## 2023-02-03 DIAGNOSIS — J301 Allergic rhinitis due to pollen: Secondary | ICD-10-CM

## 2023-02-03 DIAGNOSIS — I5042 Chronic combined systolic (congestive) and diastolic (congestive) heart failure: Secondary | ICD-10-CM

## 2023-02-03 MED ORDER — EMPAGLIFLOZIN 25 MG PO TABS: 25.0000 mg | ORAL_TABLET | Freq: Every day | ORAL | 1 refills | Status: DC

## 2023-02-03 MED ORDER — REPATHA 140 MG/ML ~~LOC~~ SOSY: 1.0000 mL | PREFILLED_SYRINGE | SUBCUTANEOUS | 1 refills | Status: DC

## 2023-02-03 MED ORDER — NEXLIZET 180-10 MG PO TABS: 1.0000 | ORAL_TABLET | Freq: Every day | ORAL | 0 refills | Status: DC

## 2023-02-03 MED ORDER — ATORVASTATIN CALCIUM 80 MG PO TABS: ORAL_TABLET | ORAL | 1 refills | Status: DC

## 2023-02-03 MED ORDER — ICOSAPENT ETHYL 1 G PO CAPS: 2.0000 g | ORAL_CAPSULE | Freq: Two times a day (BID) | ORAL | 1 refills | Status: DC

## 2023-02-03 MED ORDER — LEVOCETIRIZINE DIHYDROCHLORIDE 5 MG PO TABS: ORAL_TABLET | ORAL | 3 refills | Status: DC

## 2023-02-03 MED ORDER — METFORMIN HCL 500 MG PO TABS: 500.0000 mg | ORAL_TABLET | Freq: Two times a day (BID) | ORAL | 0 refills | Status: DC

## 2023-02-03 MED ORDER — PANTOPRAZOLE SODIUM 40 MG PO TBEC: 40.0000 mg | DELAYED_RELEASE_TABLET | Freq: Two times a day (BID) | ORAL | 0 refills | Status: DC

## 2023-02-03 MED ORDER — RANOLAZINE ER 500 MG PO TB12: 500.0000 mg | ORAL_TABLET | Freq: Two times a day (BID) | ORAL | 0 refills | Status: DC

## 2023-02-03 MED ORDER — ISOSORBIDE MONONITRATE ER 120 MG PO TB24: 120.0000 mg | ORAL_TABLET | Freq: Every day | ORAL | 1 refills | Status: DC

## 2023-02-03 MED ORDER — BD PEN NEEDLE NANO 2ND GEN 32G X 4 MM MISC: 1.0000 | Freq: Every day | 3 refills | Status: DC

## 2023-02-03 MED ORDER — SOLIQUA 100-33 UNT-MCG/ML ~~LOC~~ SOPN: 60.0000 [IU] | PEN_INJECTOR | Freq: Every day | SUBCUTANEOUS | 0 refills | Status: DC

## 2023-02-09 ENCOUNTER — Telehealth: Payer: Self-pay

## 2023-02-09 NOTE — Telephone Encounter (Signed)
Key: [id number]

## 2023-02-09 NOTE — Telephone Encounter (Signed)
Approved Coverage Ends on: 12/22/2023

## 2023-02-17 ENCOUNTER — Encounter: Payer: Self-pay | Admitting: Internal Medicine

## 2023-02-17 ENCOUNTER — Other Ambulatory Visit: Payer: Self-pay | Admitting: Internal Medicine

## 2023-02-17 DIAGNOSIS — I2510 Atherosclerotic heart disease of native coronary artery without angina pectoris: Secondary | ICD-10-CM

## 2023-02-17 DIAGNOSIS — U071 COVID-19: Secondary | ICD-10-CM | POA: Insufficient documentation

## 2023-02-17 DIAGNOSIS — E785 Hyperlipidemia, unspecified: Secondary | ICD-10-CM

## 2023-02-17 MED ORDER — NIRMATRELVIR/RITONAVIR (PAXLOVID)TABLET: 3.0000 | ORAL_TABLET | Freq: Two times a day (BID) | ORAL | 0 refills | Status: AC

## 2023-02-26 ENCOUNTER — Other Ambulatory Visit: Payer: Self-pay | Admitting: Internal Medicine

## 2023-02-26 ENCOUNTER — Encounter: Payer: Self-pay | Admitting: Internal Medicine

## 2023-02-26 DIAGNOSIS — E785 Hyperlipidemia, unspecified: Secondary | ICD-10-CM

## 2023-02-26 DIAGNOSIS — I2510 Atherosclerotic heart disease of native coronary artery without angina pectoris: Secondary | ICD-10-CM

## 2023-03-09 DIAGNOSIS — R3912 Poor urinary stream: Secondary | ICD-10-CM | POA: Diagnosis not present

## 2023-03-09 DIAGNOSIS — N401 Enlarged prostate with lower urinary tract symptoms: Secondary | ICD-10-CM | POA: Diagnosis not present

## 2023-03-09 DIAGNOSIS — R972 Elevated prostate specific antigen [PSA]: Secondary | ICD-10-CM | POA: Diagnosis not present

## 2023-03-10 NOTE — Telephone Encounter (Signed)
Insurance called and said that all [facility] medical needs is an order and office notes.  Please fax to [phone].  Phone # [phone]

## 2023-03-11 DIAGNOSIS — F330 Major depressive disorder, recurrent, mild: Secondary | ICD-10-CM | POA: Diagnosis not present

## 2023-03-11 NOTE — Telephone Encounter (Signed)
Form was received today, completed, signed by PCP and faxed back.

## 2023-03-17 DIAGNOSIS — E118 Type 2 diabetes mellitus with unspecified complications: Secondary | ICD-10-CM | POA: Diagnosis not present

## 2023-03-17 DIAGNOSIS — E119 Type 2 diabetes mellitus without complications: Secondary | ICD-10-CM | POA: Diagnosis not present

## 2023-03-23 DIAGNOSIS — F0631 Mood disorder due to known physiological condition with depressive features: Secondary | ICD-10-CM | POA: Diagnosis not present

## 2023-03-28 ENCOUNTER — Other Ambulatory Visit: Payer: Self-pay | Admitting: Internal Medicine

## 2023-03-28 DIAGNOSIS — E119 Type 2 diabetes mellitus without complications: Secondary | ICD-10-CM

## 2023-03-28 DIAGNOSIS — E118 Type 2 diabetes mellitus with unspecified complications: Secondary | ICD-10-CM

## 2023-04-02 ENCOUNTER — Ambulatory Visit (INDEPENDENT_AMBULATORY_CARE_PROVIDER_SITE_OTHER): Payer: Medicare PPO | Admitting: Neurology

## 2023-04-02 ENCOUNTER — Encounter: Payer: Self-pay | Admitting: Neurology

## 2023-04-02 VITALS — BP 152/84 | HR 75 | Ht 71.0 in | Wt 220.2 lb

## 2023-04-02 DIAGNOSIS — E669 Obesity, unspecified: Secondary | ICD-10-CM | POA: Diagnosis not present

## 2023-04-02 DIAGNOSIS — G478 Other sleep disorders: Secondary | ICD-10-CM | POA: Diagnosis not present

## 2023-04-02 DIAGNOSIS — G4733 Obstructive sleep apnea (adult) (pediatric): Secondary | ICD-10-CM | POA: Diagnosis not present

## 2023-04-02 DIAGNOSIS — G4719 Other hypersomnia: Secondary | ICD-10-CM | POA: Diagnosis not present

## 2023-04-02 NOTE — Progress Notes (Signed)
Subjective:    Patient ID: [name] is a 72 y.o. male.  HPI    Huston FoleySaima Demarian Epps, MD, PhD [facility] [address], [address] [address] [place], [place] [address]  Dear Dr. Yetta BarreJones,   I saw your patient, [name], upon your kind request in my sleep clinic today for evaluation of his obstructive sleep apnea.  The patient is accompanied by his wife today.  As you know, [name] is a 72 year old male with an underlying complex medical history of coronary artery disease, congestive heart failure, hypertension, hyperlipidemia, legal blindness, neuropathy (seen by Dr. Marjory LiesPenumalli), diabetes, arthritis, status post neck surgery, status post low back surgery, and obesity, who was previously diagnosed with obstructive sleep apnea and placed on PAP therapy.  He has not followed up in [facility] in nearly 5 years.   His Epworth sleepiness score is 23 out of 24, fatigue severity score is 63 out of 63.  Of note, he does not keep his CPAP on all night, he goes to bed late, around 1:30 AM and is up around 5 AM.  He drinks caffeine as late as 9 PM at night in the form of coffee or soda.  He does sleep during the day but usually does not use his CPAP during the day.  I reviewed his compliance data for the past 90 days, he used his machine 79 out of 90 days with percent use days greater than 4 hours and 39%, indicating suboptimal compliance, average usage of 3 hours and 41 minutes, residual AHI at goal at 3.6/h, leak acceptable with the 95th percentile at 7.8 L/min on a pressure of 6 cm with EPR of 3.  He benefits from treatment and would like to get a new machine, he should be eligible for a new machine as it has been more than 5 years since he received this CPAP machine.  He also had a recent insurance change. He does not drink any alcohol, he does not smoke.  They do not have any pets in the household. I reviewed your office note from 01/20/2023. He had a baseline sleep study on  12/29/2017, which showed a total AHI of 20.1/hour, REM AHI of 19.1/hour, supine AHI of 42.5/hour and O2 nadir of 82%. He had a CPAP titration study on 02/05/2018.  Previously:    05/06/18: 72 year old right-handed gentleman with an underlying medical history of arthritis, type 2 diabetes, glaucoma, hypertension, hyperlipidemia, low back pain, peripheral neuropathy, legal blindness, coronary artery disease, congestive heart failure and obesity, who presents for follow-up consultation of his obstructive sleep apnea after sleep study testing. The patient is accompanied by his wife again today. I first met him on 11/19/2017 at the request of Dr. Marjory LiesPenumalli, at which time he reported a prior diagnosis of OSA. I suggested we proceed with a sleep study. He had a baseline sleep study, followed by a CPAP titration study. I went over his test results with him in detail today. Baseline sleep study from 12/29/2017 showed moderate to severe obstructive sleep apnea, AHI ranged from 20.1 per hour to 42.5 per hour. Average oxygen saturation was 92%, nadir was 82%. I suggested we proceed with a sleep study with CPAP titration. He had this on 02/05/2018. He did fairly well on CPAP of 6 cm. He was placed on a nasal mask. I prescribed CPAP therapy for home use. I reviewed his CPAP compliance data from 04/04/2018 through 05/03/2018 which is a total of 30 days, during  which time he used his CPAP 28 days with percent used days greater than 4 hours at 87%, indicating very good compliance with an average usage of 6 hours and 3 minutes, residual AHI is slightly suboptimal at 6.2 per hour, leak on the low side with the 95th percentile at 4.5 L/m on a pressure of 6 cm with EPR of 2. Residual sleep-disordered breathing appears to be primarily central in nature. He reports doing well. He has adapted well to treatment and actually feels improved. His wife endorses that he seems to be sleeping more soundly.    11/19/2017: (He) was previously  diagnosed with obstructive sleep apnea. Prior sleep study results are not available for my review today. I reviewed your office note from 10/06/2017. He has never been on CPAP but was advised to start CPAP therapy in the past. He reports snoring and daytime somnolence. His Epworth sleepiness score is 20 out of 24, fatigue score is 48 out of 63. He is married and lives with his wife. He does not work. He is a nonsmoker and does not use alcohol. He drinks caffeine about 3 servings per day, typically in the form of coffee. Occasional soda. He has 3 sons. He does not have night to night nocturia. He has had rare morning headaches. Bedtime is late, around 1 AM and wakeup time generally around 9 AM. He does not indicate telltale symptoms of restless leg syndrome but is a restless sleeper. He has had back surgeries and nerve damage. He has neuropathy. He moves his legs in his sleep.        His Past Medical History Is Significant For: Past Medical History:  Diagnosis Date   Arthritis    "mild arthritis in hip"   CAD (coronary artery disease)    CHF (congestive heart failure)    Complication of anesthesia    "lungs filled up with fluid"    Diabetes mellitus    Type II   Glaucoma    Heart murmur    HTN (hypertension)    Hypercholesterolemia    LBP (low back pain)    Legally blind    Peripheral neuropathy    Sleep apnea    c-pap nightly   Staph infection    "from back surgery"   Status post insertion of drug eluting coronary artery stent    Trigger finger    Ulcer    Wears glasses    "to protect cornea" - legally blind    His Past Surgical History Is Significant For: Past Surgical History:  Procedure Laterality Date   CHOLECYSTECTOMY  09/15/2012   Procedure: LAPAROSCOPIC CHOLECYSTECTOMY WITH INTRAOPERATIVE CHOLANGIOGRAM;  Surgeon: Valarie Merino, MD;  Location: [facility] OR;  Service: General;  Laterality: N/A;   COLONOSCOPY     EYE SURGERY Bilateral    cataracts   INGUINAL HERNIA REPAIR      right   LUMBAR FUSION     LUMBAR LAMINECTOMY/DECOMPRESSION MICRODISCECTOMY N/A 10/26/2015   Procedure: RIGHT AND CENTRAL LUMBAR LAMINECTOMY L3-4, RIGHT L5-S1 LATERAL RECESS DECOMPRESSION;  Surgeon: Kerrin Champagne, MD;  Location: [facility] OR;  Service: Orthopedics;  Laterality: N/A;   NECK SURGERY     peptic ulcer dz surgery  pt was in his 45s   bleeding ulcer.    Prosthetic Cornea placement, Right eye  2007   [facility]   TRIGGER FINGER RELEASE Right 10/26/2015   Procedure: RELEASE TRIGGER FINGER RIGHT THUMB;  Surgeon: Kerrin Champagne, MD;  Location: [facility] OR;  Service:  Orthopedics;  Laterality: Right;    His Family History Is Significant For: Family History  Problem Relation Age of Onset   Breast cancer Mother    Colon cancer Mother    Hypertension Mother    Diabetes Mother    Hypertension Father    Sleep apnea Sister    Diabetes Sister    Sleep apnea Brother    Diabetes Brother    Colon cancer Other        Elevated Risk for   Esophageal cancer Neg Hx    Stomach cancer Neg Hx    Rectal cancer Neg Hx     His Social History Is Significant For: Social History   Socioeconomic History   Marital status: Married    Spouse name: [name]   Number of children: 3   Years of education: 12   Highest education level: Not on file  Occupational History   Occupation: disabled    Comment: blind  Tobacco Use   Smoking status: Never   Smokeless tobacco: Never  Vaping Use   Vaping Use: Never used  Substance and Sexual Activity   Alcohol use: No   Drug use: No   Sexual activity: Not Currently  Other Topics Concern   Not on file  Social History Narrative   Occupation: disabled, blind   Married   Regular Exercise-no   Lives at home with his wife.   Right-handed.   2-3 cups caffeine per day.   Social Determinants of Health   Financial Resource Strain: Low Risk  (03/04/2021)   Overall Financial Resource Strain (CARDIA)    Difficulty of Paying Living Expenses: Not hard at all  Food  Insecurity: No Food Insecurity (03/04/2021)   Hunger Vital Sign    Worried About Running Out of Food in the Last Year: Never true    Ran Out of Food in the Last Year: Never true  Transportation Needs: Unmet Transportation Needs (03/04/2021)   PRAPARE - Transportation    Lack of Transportation (Medical): Yes    Lack of Transportation (Non-Medical): Yes  Physical Activity: Inactive (03/04/2021)   Exercise Vital Sign    Days of Exercise per Week: 0 days    Minutes of Exercise per Session: 0 min  Stress: No Stress Concern Present (03/04/2021)   [employer] of Occupational Health - Occupational Stress Questionnaire    Feeling of Stress : Not at all  Social Connections: Socially Integrated (03/04/2021)   Social Connection and Isolation Panel [NHANES]    Frequency of Communication with Friends and Family: More than three times a week    Frequency of Social Gatherings with Friends and Family: Once a week    Attends Religious Services: 1 to 4 times per year    Active Member of [employer] or Organizations: No    Attends Engineer, structural: 1 to 4 times per year    Marital Status: Married    His Allergies Are:  Allergies  Allergen Reactions   Furosemide     pancreatitis   Olmesartan Other (See Comments)    hyperkalemia   Amlodipine Swelling    LE EDEMA   Diamox [Acetazolamide] Itching    hives   Lisinopril Rash  :   His Current Medications Are:  Outpatient Encounter Medications as of 04/02/2023  Medication Sig   ASPIRIN LOW DOSE 81 MG tablet TAKE 1 TABLET (81 MG TOTAL) BY MOUTH DAILY. SWALLOW WHOLE.   atorvastatin (LIPITOR) 80 MG tablet Take 1 tablet by mouth daily at 6pm.  carvedilol (COREG) 12.5 MG tablet Take one (1) tablet by mouth ( 12.5 mg ) twice daily.   Cholecalciferol (VITAMIN D3) 25 MCG (1000 UT) CAPS Take by mouth.   Continuous Blood Gluc Receiver (FREESTYLE LIBRE 3 READER) DEVI 1 Act by Does not apply route daily.   Continuous Blood Gluc Sensor (FREESTYLE LIBRE  3 SENSOR) MISC 1 Act by Does not apply route daily. Place 1 sensor on the skin every 14 days. Use to check glucose continuously   empagliflozin (JARDIANCE) 25 MG TABS tablet Take 1 tablet (25 mg total) by mouth daily.   Evolocumab (REPATHA) 140 MG/ML SOSY Inject 1 mL into the skin every 14 (fourteen) days.   fluticasone (FLONASE) 50 MCG/ACT nasal spray SPRAY 2 SPRAYS INTO EACH NOSTRIL EVERY DAY   gabapentin (NEURONTIN) 300 MG capsule Take  capsule three times a day   Glucagon 1 MG/0.2ML SOAJ Inject 1 Act into the skin once as needed for up to 1 dose.   icosapent Ethyl (VASCEPA) 1 g capsule Take 2 capsules (2 g total) by mouth 2 (two) times daily.   indapamide (LOZOL) 1.25 MG tablet Take 1.25 mg by mouth daily.   Insulin Pen Needle (BD PEN NEEDLE NANO 2ND GEN) 32G X 4 MM MISC Inject 1 Act into the skin daily.   isosorbide mononitrate (IMDUR) 120 MG 24 hr tablet Take 1 tablet (120 mg total) by mouth daily.   levocetirizine (XYZAL) 5 MG tablet TAKE 1 TABLET BY MOUTH EVERY DAY IN THE EVENING   metFORMIN (GLUCOPHAGE) 500 MG tablet Take 1 tablet (500 mg total) by mouth 2 (two) times daily with a meal.   nitroGLYCERIN (NITROSTAT) 0.4 MG SL tablet Place 1 tablet (0.4 mg total) under the tongue every 5 (five) minutes as needed for chest pain. Do not exceed total of 3 dose in 15 minutes. Please make overdue appt with Dr. Anne Fu before anymore refills. Thank you 1st attempt   pantoprazole (PROTONIX) 40 MG tablet Take 1 tablet (40 mg total) by mouth 2 (two) times daily.   ranolazine (RANEXA) 500 MG 12 hr tablet Take 1 tablet (500 mg total) by mouth 2 (two) times daily.   SOLIQUA 100-33 UNT-MCG/ML SOPN INJECT 60 UNITS INTO THE SKIN DAILY.   tamsulosin (FLOMAX) 0.4 MG CAPS capsule Take 0.4 mg by mouth once.   traMADol (ULTRAM) 50 MG tablet TAKE 1 TABLET BY MOUTH EVERY 8 HOURS AS NEEDED.   No facility-administered encounter medications on file as of 04/02/2023.  :   Review of Systems:  Out of a complete 14  point review of systems, all are reviewed and negative with the exception of these symptoms as listed below:  Review of Systems  Neurological:        Pt here for sleep consult  Pt fatigue,controlled hypertension Pt denies snoring,headaches Pt sleep study 2019 and CPAP same year.   ESS:23 FSS:63    Objective:  Neurological Exam  Physical Exam Physical Examination:   Vitals:   04/02/23 1422  BP: (!) 152/84  Pulse: 75    General Examination: The patient is a very pleasant 72 y.o. male in no acute distress. He appears well-developed and well-nourished and well groomed.   HEENT: Normocephalic, atraumatic, visually impaired, R eye deformity, s/p R corneal tx. Hearing is grossly intact. Face is symmetric with normal facial animation. Speech is clear with no dysarthria noted. There is no hypophonia. There is no lip, neck/head, jaw or voice tremor. Neck with FROM. Oropharynx exam reveals: mild mouth  dryness, adequate dental hygiene with denture on top, edentulous on the bottom.  Moderate airway crowding, tonsils and uvula absent.  Tongue protrudes centrally.     Chest: Clear to auscultation without wheezing, rhonchi or crackles noted.   Heart: S1+S2+0, regular and normal without murmurs, rubs or gallops noted.    Abdomen: Soft, non-tender and non-distended.   Extremities: There is edema in the R > L ankle.    Skin: Warm and dry without trophic changes noted.   Musculoskeletal: exam reveals no obvious joint deformities.    Neurologically:  Mental status: The patient is awake, alert and oriented in all 4 spheres. Her immediate and remote memory, attention, language skills and fund of knowledge are appropriate. There is no evidence of aphasia, agnosia, apraxia or anomia. Speech is clear with normal prosody and enunciation. Thought process is linear. Mood is normal and affect is normal.  Cranial nerves II - XII are as described above under HEENT exam.  Motor exam: thin bulk, mild global  weakness.  Fine motor skills and coordination: globally mildly impaired.  Cerebellar testing: No dysmetria or intention tremor on finger to nose testing. Heel to shin is unremarkable bilaterally. There is no truncal or gait ataxia.  Sensory exam: intact to light touch in the upper and lower extremities.  Gait, station and balance: He stands with difficulty. He walks with a cane and holds onto his wife.      Assessment and Plan:  In summary, [name] is a 72 year old male with an underlying complex medical history of coronary artery disease, congestive heart failure, hypertension, hyperlipidemia, legal blindness, neuropathy (seen by Dr. Marjory Lies), diabetes, arthritis, status post neck surgery, status post low back surgery, and obesity, who presents for evaluation of his obstructive sleep apnea.  He was diagnosed with moderate obstructive sleep apnea in early 2019 and he started CPAP therapy also in early 2019.  He is generally compliant with using his machine but does not keep it on very long at night and has not used it during his naps during the day.  We talked about the importance of keeping a set schedule for sleep and also good sleep hygiene, avoiding caffeine later in the day, he is encouraged to limit his caffeine to 2 servings per day and try to avoid caffeine after 5 PM.  He is advised to be consistent with his CPAP usage and put it back on when he realizes he has taken it off at night or when he goes to the bathroom.  He does admit that sometimes he does not put it back on after coming back from his bathroom break.  He is motivated to continue with treatment and does endorse benefit from treatment with CPAP therapy.  He should be eligible for a new machine.  We will proceed with a home sleep test for reevaluation and hopefully he will get a new machine shortly thereafter.  We will plan a follow-up after he has established treatment with a new PAP machine.  I answered all the questions today and  the patient and his wife are in agreement.  Thank you very much for allowing me to participate in the care of this nice patient. If I can be of any further assistance to you please do not hesitate to call me at [phone].  Sincerely,   Huston Foley, MD, PhD

## 2023-04-02 NOTE — Patient Instructions (Signed)
As discussed, I will order a home sleep test for reevaluation of your sleep apnea.  Please use your CPAP more consistently each night and keep it on all night, try to avoid sleeping during the day, but if you do not, please use your CPAP during the day.  Avoid caffeine and limit yourself to 2 servings per day, avoid caffeine after 5 PM. Keep a schedule for your sleep at night.  You should be eligible for new CPAP machine.

## 2023-04-18 ENCOUNTER — Other Ambulatory Visit: Payer: Self-pay | Admitting: Internal Medicine

## 2023-04-18 DIAGNOSIS — I2510 Atherosclerotic heart disease of native coronary artery without angina pectoris: Secondary | ICD-10-CM

## 2023-04-20 ENCOUNTER — Telehealth: Payer: Self-pay | Admitting: Neurology

## 2023-04-20 NOTE — Telephone Encounter (Signed)
[employer] no auth req/ Medicaid pendin uploaded notes

## 2023-04-22 ENCOUNTER — Encounter: Payer: Self-pay | Admitting: Podiatry

## 2023-04-22 ENCOUNTER — Ambulatory Visit (INDEPENDENT_AMBULATORY_CARE_PROVIDER_SITE_OTHER): Payer: Medicare PPO | Admitting: Podiatry

## 2023-04-22 VITALS — BP 147/72

## 2023-04-22 DIAGNOSIS — E1142 Type 2 diabetes mellitus with diabetic polyneuropathy: Secondary | ICD-10-CM

## 2023-04-22 DIAGNOSIS — B351 Tinea unguium: Secondary | ICD-10-CM

## 2023-04-22 DIAGNOSIS — M79609 Pain in unspecified limb: Secondary | ICD-10-CM

## 2023-04-22 DIAGNOSIS — E785 Hyperlipidemia, unspecified: Secondary | ICD-10-CM

## 2023-04-25 NOTE — Progress Notes (Signed)
  Subjective:  Patient ID: [name], male    DOB: [birth year],  MRN: [id number]  [name] presents to clinic today for at risk foot care with history of diabetic neuropathy. He is accompanied by his wife on today's visit.  Chief Complaint  Patient presents with   Nail Problem    [facility] BS-did not check today A1C-6.8 PCP-Jones PCP VST-01/20/2023   New problem(s): None.   PCP is Etta Grandchild, MD.  Allergies  Allergen Reactions   Furosemide     pancreatitis   Olmesartan Other (See Comments)    hyperkalemia   Amlodipine Swelling    LE EDEMA   Diamox [Acetazolamide] Itching    hives   Lisinopril Rash    Review of Systems: Negative except as noted in the HPI.  Objective: No changes noted in today's physical examination. Vitals:   04/22/23 1339  BP: (!) 147/72   [name] is a pleasant 72 y.o. male in NAD. AAO x 3.  Vascular Examination: Capillary fill time to digits <3 seconds b/l lower extremities. Faintly palpable DP pulse(s) b/l lower extremities. Nonpalpable PT pulse(s) b/l lower extremities. Pedal hair absent. Lower extremity skin temperature gradient within normal limits. No pain with calf compression b/l. RLE edema of ankle.  Dermatological Examination: Pedal skin is thin shiny, atrophic b/l lower extremities. No open wounds b/l lower extremities No interdigital macerations b/l lower extremities Toenails 1-5 b/l elongated, discolored, dystrophic, thickened, crumbly with subungual debris and tenderness to dorsal palpation.  Musculoskeletal Examination: Muscle strength 3/5 to all LE muscle groups of left lower extremity. Muscle strength 2/5 to all LE muscle groups of right lower extremity. No pain crepitus or joint limitation noted with ROM b/l. No gross bony deformities bilaterally. Wearing appropriate fitting shoe gear. Utilizes cane for ambulation assistance.  Neurological Examination: Pt has subjective symptoms of neuropathy. Protective sensation  diminished with 10g monofilament b/l. Vibratory sensation diminished b/l.  Assessment/Plan: 1. Pain due to onychomycosis of nail   2. Dyslipidemia   3. Diabetic peripheral neuropathy associated with type 2 diabetes mellitus (HCC)   VAS [place] ABI WITH/WO TBI -Patient's family member present. All questions/concerns addressed on today's visit. -Due to risk factor(s), ordered noninvasive arterial studies ABIs with and without TBIs for b/l lower extremities. -Continue foot and shoe inspections daily. Monitor blood glucose per PCP/Endocrinologist's recommendations. -Continue supportive shoe gear daily. -Mycotic toenails 1-5 bilaterally were debrided in length and girth with sterile nail nippers and dremel without incident. -Patient/POA to call should there be question/concern in the interim.   Return in about 3 months (around 07/23/2023).  Freddie Breech, DPM

## 2023-04-29 NOTE — Telephone Encounter (Signed)
04/29/23 left VM [place] 04/28/23 Medicaid no auth req bc it is secondary EE

## 2023-04-30 NOTE — Telephone Encounter (Signed)
HST- [employer]/medicaid no auth req   Patient is scheduled for 05/27/23 at 2 pm.  Mailed packet to the patient.

## 2023-04-30 NOTE — Telephone Encounter (Signed)
Called patient's wife. She is upset that she received a call yesterday from [place] and when she called back she was Regulatory affairs officer for [place]. Tried explaining to patient that Tresa Endo was helping [place] call patients and the number she was to call back was correct (Emily's number). Per patient's wife, she called back 5 times today (during lunch) and did not leave a voicemail. She stated she didn't think she needed to leave a voicemail, someone should have been available to answer her call. I tried explaining to patient that we were not staffed to accommodate that request, but I would be happy to get the appointment scheduled during this call. Offered the next available appt. At end of call, she expressed appreciation for a return call back.

## 2023-04-30 NOTE — Telephone Encounter (Signed)
Patient called back, I l called the patient back but did pick up left a voicemail and sent the patient a mychart message.

## 2023-04-30 NOTE — Telephone Encounter (Signed)
Pt's wife(on DPR) has called back expressing her frustration in trying to speak with someone re: the scheduling of the sleep study for pt.  She is asking for a call from office manager to express her concerns on this matter.  Wife can be reached at [phone]

## 2023-05-27 ENCOUNTER — Ambulatory Visit (INDEPENDENT_AMBULATORY_CARE_PROVIDER_SITE_OTHER): Payer: Medicare PPO | Admitting: Neurology

## 2023-05-27 DIAGNOSIS — G478 Other sleep disorders: Secondary | ICD-10-CM

## 2023-05-27 DIAGNOSIS — E669 Obesity, unspecified: Secondary | ICD-10-CM

## 2023-05-27 DIAGNOSIS — G4733 Obstructive sleep apnea (adult) (pediatric): Secondary | ICD-10-CM | POA: Diagnosis not present

## 2023-05-27 DIAGNOSIS — G4731 Primary central sleep apnea: Secondary | ICD-10-CM

## 2023-05-27 DIAGNOSIS — G4719 Other hypersomnia: Secondary | ICD-10-CM

## 2023-05-27 DIAGNOSIS — E66811 Obesity, class 1: Secondary | ICD-10-CM

## 2023-05-28 NOTE — Progress Notes (Signed)
See procedure note.

## 2023-05-29 ENCOUNTER — Encounter: Payer: Self-pay | Admitting: Neurology

## 2023-05-29 NOTE — Procedures (Signed)
[facility]  HOME SLEEP TEST (Watch PAT) REPORT  STUDY DATE: 05/27/23  DOB: 16-Dec-1951  MRN: [id number]  ORDERING CLINICIAN: Huston Foley, MD, PhD   REFERRING CLINICIAN: Etta Grandchild, MD   CLINICAL INFORMATION/HISTORY: 72 year old male with an underlying complex medical history of coronary artery disease, congestive heart failure, hypertension, hyperlipidemia, legal blindness, neuropathy (seen by Dr. Marjory Lies), diabetes, arthritis, status post neck surgery, status post low back surgery, and obesity, who presents for re-evaluation of his obstructive sleep apnea. He has been on CPAP therapy of 6 cm with EPR of 3. He qualifies for new equipment.    Epworth sleepiness score: 23/24.  BMI: 30.9 kg/m  FINDINGS:   Sleep Summary:   Total Recording Time (hours, min): 7 hours, 30 min  Total Sleep Time (hours, min):  6 hours, 36 min  Percent REM (%):    7.1%   Respiratory Indices:   Calculated pAHI (per hour):  61.5/hour         REM pAHI:    N/A       NREM pAHI: 61.5/hour  Central pAHI: 15.6/hour  Oxygen Saturation Statistics:    Oxygen Saturation (%) Mean: 93%   Minimum oxygen saturation (%):                 82%   O2 Saturation Range (%): 82 - 98%    O2 Saturation (minutes) <=88%: 2.9 min  Pulse Rate Statistics:   Pulse Mean (bpm):    64/min    Pulse Range (51 - 106/min)   IMPRESSION: OSA (obstructive sleep apnea), severe Central Sleep Apnea   RECOMMENDATION:  This home sleep test demonstrates severe obstructive sleep apnea with a total AHI of 61.5/hour and O2 nadir of 82%.  AHI was 51/h, utilizing the 4% desaturation criteria by Medicare guidelines.  There was a moderate central apnea component as well.  Snoring was noted in the mild to moderate range.  Ongoing treatment with positive airway pressure is highly recommended. The patient is established on CPAP therapy and qualifies for a new machine.  I will write for a new CPAP machine, current  pressure settings appear adequate but I would recommend a CPAP of 7 cm with EPR of 3, mask of choice.  A laboratory attended titration study can be considered in the future for optimization of treatment settings and to improve tolerance and compliance. Alternative treatment options are limited secondary to the severity of the patient's sleep disordered breathing, but may include surgical treatment with an implantable hypoglossal nerve stimulator (in carefully selected candidates, meeting criteria).  Concomitant weight loss is recommended, where clinically appropriate. Please note, that untreated obstructive sleep apnea may carry additional perioperative morbidity. Patients with significant obstructive sleep apnea should receive perioperative PAP therapy and the surgeons and particularly the anesthesiologist should be informed of the diagnosis and the severity of the sleep disordered breathing. The patient should be cautioned not to drive, work at heights, or operate dangerous or heavy equipment when tired or sleepy. Review and reiteration of good sleep hygiene measures should be pursued with any patient. Other causes of the patient's symptoms, including circadian rhythm disturbances, an underlying mood disorder, medication effect and/or an underlying medical problem cannot be ruled out based on this test. Clinical correlation is recommended.  The patient and his referring provider will be notified of the test results. The patient will be seen in follow up in sleep clinic at [facility].  I certify that I have reviewed the raw data recording prior  to the issuance of this report in accordance with the standards of the American Academy of Sleep Medicine (AASM).    INTERPRETING PHYSICIAN:   Huston Foley, MD, PhD Medical Director, [facility]) Diplomat, ABPN (Neurology and Sleep)   [facility] [address], [address] [place], [place] [address] [phone]

## 2023-05-29 NOTE — Addendum Note (Signed)
Addended by: Huston Foley on: 05/29/2023 10:03 AM   Modules accepted: Orders

## 2023-06-04 ENCOUNTER — Telehealth: Payer: Self-pay | Admitting: *Deleted

## 2023-06-04 ENCOUNTER — Other Ambulatory Visit: Payer: Self-pay | Admitting: Internal Medicine

## 2023-06-04 DIAGNOSIS — K219 Gastro-esophageal reflux disease without esophagitis: Secondary | ICD-10-CM

## 2023-06-04 DIAGNOSIS — E118 Type 2 diabetes mellitus with unspecified complications: Secondary | ICD-10-CM

## 2023-06-04 NOTE — Telephone Encounter (Signed)
Pt's wife and patient called in and we discussed the DME company for his new machine. Pt's wife is appreciative of a local company and states they have had a good experience with [employer] and home visits as the patient is blind. Patient's wife states his insurance is new this year, [employer] medicare PPO plan and she wants to be sure his DME company is in network with insurance. I told her I would request clarification with [employer] when I send the order to them. She was appreciative.  We discussed the insurance compliance requirements which includes using the machine at least 4 hours at night and also being seen in our office for an initial follow-up between 30 and 90 days after set up. We scheduled pt's initial f/u for 9/4 at 345 pm arrival 15-30 minutes early. Pt's wife will watch for a call from [employer] within one week and she states she has their number.  Asked her to call them or advise if she does not hear within 1 week.  She was appreciative of the call.  Order sent to [employer].

## 2023-06-08 ENCOUNTER — Telehealth (INDEPENDENT_AMBULATORY_CARE_PROVIDER_SITE_OTHER): Payer: Medicare PPO | Admitting: Podiatry

## 2023-06-08 ENCOUNTER — Ambulatory Visit (HOSPITAL_COMMUNITY)
Admission: RE | Admit: 2023-06-08 | Discharge: 2023-06-08 | Disposition: A | Discharge status: Home / Self Care | Payer: Medicare PPO | Source: Ambulatory Visit | Attending: Podiatry | Admitting: Podiatry

## 2023-06-08 DIAGNOSIS — E785 Hyperlipidemia, unspecified: Secondary | ICD-10-CM | POA: Diagnosis not present

## 2023-06-08 DIAGNOSIS — E1142 Type 2 diabetes mellitus with diabetic polyneuropathy: Secondary | ICD-10-CM | POA: Insufficient documentation

## 2023-06-08 LAB — VAS US ABI WITH/WO TBI
Left ABI: 0.98
Right ABI: 0.7

## 2023-06-08 NOTE — Telephone Encounter (Signed)
Phoned to give results of bilateral ABI results. No answer. LVM. I will attempt to call tomorrow after clinic.

## 2023-06-09 DIAGNOSIS — R972 Elevated prostate specific antigen [PSA]: Secondary | ICD-10-CM | POA: Diagnosis not present

## 2023-06-10 ENCOUNTER — Other Ambulatory Visit: Payer: Self-pay | Admitting: Internal Medicine

## 2023-06-10 ENCOUNTER — Telehealth: Payer: Self-pay | Admitting: Internal Medicine

## 2023-06-10 DIAGNOSIS — M961 Postlaminectomy syndrome, not elsewhere classified: Secondary | ICD-10-CM

## 2023-06-10 DIAGNOSIS — M5416 Radiculopathy, lumbar region: Secondary | ICD-10-CM

## 2023-06-10 DIAGNOSIS — E0849 Diabetes mellitus due to underlying condition with other diabetic neurological complication: Secondary | ICD-10-CM

## 2023-06-10 NOTE — Telephone Encounter (Signed)
Prescription Request  06/10/2023  LOV: 01/20/2023  What is the name of the medication or equipment?  traMADol (ULTRAM) 50 MG tablet   Have you contacted your pharmacy to request a refill? No   Which pharmacy would you like this sent to?  [id number] [place], [place] - [address] [place] [address] Phone: [phone] Fax: [phone]    Patient notified that their request is being sent to the clinical staff for review and that they should receive a response within 2 business days.   Please advise at Mobile [phone] (mobile)    Next OV is 06/23/2023.

## 2023-06-11 ENCOUNTER — Ambulatory Visit (INDEPENDENT_AMBULATORY_CARE_PROVIDER_SITE_OTHER): Payer: Medicare PPO

## 2023-06-11 VITALS — Ht 71.0 in | Wt 220.0 lb

## 2023-06-11 DIAGNOSIS — Z0000 Encounter for general adult medical examination without abnormal findings: Secondary | ICD-10-CM

## 2023-06-11 NOTE — Progress Notes (Signed)
Subjective:   [name] is a 72 y.o. male who presents for Medicare Annual/Subsequent preventive examination.  Visit Complete: Virtual  I connected with  [name] on 06/11/23 by a audio enabled telemedicine application and verified that I am speaking with the correct person using two identifiers.  Patient Location: Home  Provider Location: Home Office  I discussed the limitations of evaluation and management by telemedicine. The patient expressed understanding and agreed to proceed.  Patient Medicare AWV questionnaire was completed by the patient on ; I have confirmed that all information answered by patient is correct and no changes since this date.  Review of Systems     Cardiac Risk Factors include: advanced age (>58men, >50 women);male gender;diabetes mellitus;hypertension     Objective:    Today's Vitals   06/11/23 1336 06/11/23 1337  Weight: 220 lb (99.8 kg)   Height: 5\' 11"  (1.803 m)   PainSc:  6    Body mass index is 30.68 kg/m.     06/11/2023    2:01 PM 03/04/2021    3:34 PM 07/13/2019    2:07 PM 06/09/2019    3:53 PM 05/11/2018    2:43 PM 07/09/2017    9:00 PM 07/09/2017    3:49 PM  Advanced Directives  Does Patient Have a Medical Advance Directive? No No No No No No No  Does patient want to make changes to medical advance directive?     Yes (ED - Information included in AVS)    Would patient like information on creating a medical advance directive? No - Patient declined No - Patient declined No - Patient declined No - Patient declined  Yes (Inpatient - patient requests chaplain consult to create a medical advance directive)     Current Medications (verified) Outpatient Encounter Medications as of 06/11/2023  Medication Sig   ASPIRIN LOW DOSE 81 MG tablet TAKE 1 TABLET (81 MG TOTAL) BY MOUTH DAILY. SWALLOW WHOLE.   atorvastatin (LIPITOR) 80 MG tablet Take 1 tablet by mouth daily at 6pm.   carvedilol (COREG) 12.5 MG tablet Take one (1) tablet by mouth (  12.5 mg ) twice daily.   Cholecalciferol (VITAMIN D3) 25 MCG (1000 UT) CAPS Take by mouth.   Continuous Blood Gluc Receiver (FREESTYLE LIBRE 3 READER) DEVI 1 Act by Does not apply route daily.   Continuous Blood Gluc Sensor (FREESTYLE LIBRE 3 SENSOR) MISC 1 Act by Does not apply route daily. Place 1 sensor on the skin every 14 days. Use to check glucose continuously   empagliflozin (JARDIANCE) 25 MG TABS tablet Take 1 tablet (25 mg total) by mouth daily.   Evolocumab (REPATHA) 140 MG/ML SOSY Inject 1 mL into the skin every 14 (fourteen) days.   fluticasone (FLONASE) 50 MCG/ACT nasal spray SPRAY 2 SPRAYS INTO EACH NOSTRIL EVERY DAY   gabapentin (NEURONTIN) 300 MG capsule Take  capsule three times a day   Glucagon 1 MG/0.2ML SOAJ Inject 1 Act into the skin once as needed for up to 1 dose.   icosapent Ethyl (VASCEPA) 1 g capsule Take 2 capsules (2 g total) by mouth 2 (two) times daily.   indapamide (LOZOL) 1.25 MG tablet Take 1.25 mg by mouth daily.   Insulin Pen Needle (BD PEN NEEDLE NANO 2ND GEN) 32G X 4 MM MISC Inject 1 Act into the skin daily.   isosorbide mononitrate (IMDUR) 120 MG 24 hr tablet Take 1 tablet (120 mg total) by mouth daily.   levocetirizine (XYZAL) 5 MG  tablet TAKE 1 TABLET BY MOUTH EVERY DAY IN THE EVENING   metFORMIN (GLUCOPHAGE) 500 MG tablet TAKE 1 TABLET TWICE DAILY WITH MEALS   nitroGLYCERIN (NITROSTAT) 0.4 MG SL tablet Place 1 tablet (0.4 mg total) under the tongue every 5 (five) minutes as needed for chest pain. Do not exceed total of 3 dose in 15 minutes. Please make overdue appt with Dr. Anne Fu before anymore refills. Thank you 1st attempt   pantoprazole (PROTONIX) 40 MG tablet TAKE 1 TABLET TWICE DAILY   ranolazine (RANEXA) 500 MG 12 hr tablet TAKE 1 TABLET TWICE DAILY   SOLIQUA 100-33 UNT-MCG/ML SOPN INJECT 60 UNITS INTO THE SKIN DAILY.   tamsulosin (FLOMAX) 0.4 MG CAPS capsule Take 0.4 mg by mouth once.   traMADol (ULTRAM) 50 MG tablet TAKE 1 TABLET BY MOUTH EVERY 8  HOURS AS NEEDED   No facility-administered encounter medications on file as of 06/11/2023.    Allergies (verified) Furosemide, Olmesartan, Amlodipine, Diamox [acetazolamide], and Lisinopril   History: Past Medical History:  Diagnosis Date   Arthritis    "mild arthritis in hip"   CAD (coronary artery disease)    CHF (congestive heart failure) (HCC)    Complication of anesthesia    "lungs filled up with fluid"    Diabetes mellitus    Type II   Glaucoma    Heart murmur    HTN (hypertension)    Hypercholesterolemia    LBP (low back pain)    Legally blind    Peripheral neuropathy    Sleep apnea    c-pap nightly   Staph infection    "from back surgery"   Status post insertion of drug eluting coronary artery stent    Trigger finger    Ulcer    Wears glasses    "to protect cornea" - legally blind   Past Surgical History:  Procedure Laterality Date   CHOLECYSTECTOMY  09/15/2012   Procedure: LAPAROSCOPIC CHOLECYSTECTOMY WITH INTRAOPERATIVE CHOLANGIOGRAM;  Surgeon: Valarie Merino, MD;  [facility] OR;  Service: General;  Laterality: N/A;   COLONOSCOPY     EYE SURGERY Bilateral    cataracts   INGUINAL HERNIA REPAIR     right   LUMBAR FUSION     LUMBAR LAMINECTOMY/DECOMPRESSION MICRODISCECTOMY N/A 10/26/2015   Procedure: RIGHT AND CENTRAL LUMBAR LAMINECTOMY L3-4, RIGHT L5-S1 LATERAL RECESS DECOMPRESSION;  Surgeon: Kerrin Champagne, MD;  Location: [facility] OR;  Service: Orthopedics;  Laterality: N/A;   NECK SURGERY     peptic ulcer dz surgery  pt was in his 95s   bleeding ulcer.    Prosthetic Cornea placement, Right eye  2007   [facility]   TRIGGER FINGER RELEASE Right 10/26/2015   Procedure: RELEASE TRIGGER FINGER RIGHT THUMB;  Surgeon: Kerrin Champagne, MD;  Location: [facility] OR;  Service: Orthopedics;  Laterality: Right;   Family History  Problem Relation Age of Onset   Breast cancer Mother    Colon cancer Mother    Hypertension Mother    Diabetes Mother    Hypertension Father     Sleep apnea Sister    Diabetes Sister    Sleep apnea Brother    Diabetes Brother    Colon cancer Other        Elevated Risk for   Esophageal cancer Neg Hx    Stomach cancer Neg Hx    Rectal cancer Neg Hx    Social History   Socioeconomic History   Marital status: Married    Spouse name:  [name]   Number of children: 3   Years of education: 12   Highest education level: Not on file  Occupational History   Occupation: disabled    Comment: blind  Tobacco Use   Smoking status: Never   Smokeless tobacco: Never  Vaping Use   Vaping Use: Never used  Substance and Sexual Activity   Alcohol use: No   Drug use: No   Sexual activity: Not Currently  Other Topics Concern   Not on file  Social History Narrative   Occupation: disabled, blind   Married   Regular Exercise-no   Lives at home with his wife.   Right-handed.   2-3 cups caffeine per day.   Social Determinants of Health   Financial Resource Strain: Low Risk  (06/11/2023)   Overall Financial Resource Strain (CARDIA)    Difficulty of Paying Living Expenses: Not hard at all  Food Insecurity: No Food Insecurity (06/11/2023)   Hunger Vital Sign    Worried About Running Out of Food in the Last Year: Never true    Ran Out of Food in the Last Year: Never true  Transportation Needs: No Transportation Needs (06/11/2023)   PRAPARE - Administrator, Civil Service (Medical): No    Lack of Transportation (Non-Medical): No  Physical Activity: Inactive (06/11/2023)   Exercise Vital Sign    Days of Exercise per Week: 0 days    Minutes of Exercise per Session: 0 min  Stress: No Stress Concern Present (06/11/2023)   [employer] of Occupational Health - Occupational Stress Questionnaire    Feeling of Stress : Not at all  Social Connections: Socially Integrated (06/11/2023)   Social Connection and Isolation Panel [NHANES]    Frequency of Communication with Friends and Family: More than three times a week    Frequency  of Social Gatherings with Friends and Family: More than three times a week    Attends Religious Services: More than 4 times per year    Active Member of [employer] or Organizations: Yes    Attends Engineer, structural: More than 4 times per year    Marital Status: Married    Tobacco Counseling Counseling given: Not Answered   Clinical Intake:  Pre-visit preparation completed: No  Pain : 0-10 Pain Score: 6  Pain Type: Chronic pain Pain Location: Back Pain Orientation: Lower Pain Descriptors / Indicators: Constant Pain Onset: More than a month ago Pain Frequency: Constant Pain Relieving Factors: Rx Medications Effect of Pain on Daily Activities: Effects daily activities   Activities of Daily Living    06/11/2023    1:52 PM  In your present state of health, do you have any difficulty performing the following activities:  Hearing? 0  Vision? 1  Comment Legally Blind  Difficulty concentrating or making decisions? 1  Comment Wife assist. Followed by PCP  Walking or climbing stairs? 1  Comment Legally Blind  Dressing or bathing? 1  Comment Legally Blind  Doing errands, shopping? 1  Comment Optician, dispensing and eating ? Y  Using the Toilet? N  In the past six months, have you accidently leaked urine? N  Do you have problems with loss of bowel control? N  Managing your Medications? N  Comment Wife assist  Managing your Finances? Y  Comment Wife assist  Housekeeping or managing your Housekeeping? Y  Comment Wife assist    Patient Care Team: Etta Grandchild, MD as PCP - General Jake Bathe, MD as  PCP - Cardiology (Cardiology) Huston Foley, MD as Attending Physician (Neurology) Helane Gunther, DPM as Consulting Physician (Podiatry) Szabat, Vinnie Level, [facility] (Inactive) (Pharmacist)  Indicate any recent Medical Services you may have received from other than [facility] providers in the past year (date may be approximate).     Assessment:   This is a routine  wellness examination for Gleed.  Hearing/Vision screen Hearing Screening - Comments:: Denies hearing difficulties   Vision Screening - Comments:: Legally blind - up to date with routine eye exams with  [facility]  Dietary issues and exercise activities discussed:     Goals Addressed               This Visit's Progress     No current goals (pt-stated)         Depression Screen    06/11/2023    1:51 PM 01/20/2023   10:44 AM 06/25/2022    1:58 PM 06/25/2022    1:57 PM 03/04/2021    3:31 PM 11/08/2020   12:58 PM 07/13/2019    2:07 PM  PHQ 2/9 Scores  PHQ - 2 Score 0 0 0 0 0 0 2  PHQ- 9 Score   0        Fall Risk    06/11/2023    1:58 PM 01/20/2023   10:43 AM 06/25/2022    1:57 PM 03/04/2021    3:35 PM 11/08/2020   12:58 PM  Fall Risk   Falls in the past year? 1 1 0 1 0  Number falls in past yr: 1 0  1   Injury with Fall? 1 1  0   Comment Brusing followed by medical attention lost balanced     Risk for fall due to : Impaired balance/gait;Impaired vision;Other (Comment) Impaired balance/gait;Impaired vision  Impaired balance/gait;Impaired vision;History of fall(s)   Risk for fall due to: Comment Legally Blind      Follow up Falls prevention discussed Falls evaluation completed  Falls evaluation completed     MEDICARE RISK AT HOME:  Medicare Risk at Home - 06/11/23 1407     Any stairs in or around the home? No    If so, are there any without handrails? No    Home free of loose throw rugs in walkways, pet beds, electrical cords, etc? Yes    Adequate lighting in your home to reduce risk of falls? Yes    Life alert? No    Use of a cane, walker or w/c? Yes    Grab bars in the bathroom? Yes    Shower chair or bench in shower? Yes    Elevated toilet seat or a handicapped toilet? Yes             TIMED UP AND GO:  Was the test performed?  No    Cognitive Function:    05/11/2018    3:31 PM  MMSE - Mini Mental State Exam  Not completed: Unable to complete         06/11/2023    2:01 PM  6CIT Screen  What Year? 0 points  What month? 0 points  What time? 0 points  Count back from 20 0 points  Months in reverse 4 points  Repeat phrase 8 points  Total Score 12 points    Immunizations Immunization History  Administered Date(s) Administered   Fluad Quad(high Dose 65+) 02/08/2020, 11/08/2020, 12/04/2021, 01/20/2023   Influenza Split 09/12/2012   Influenza Whole 10/28/2010, 10/28/2011   Influenza, High Dose  Seasonal PF 08/25/2013, 08/21/2015, 12/09/2016, 02/24/2019   Influenza, Quadrivalent, Recombinant, Inj, Pf 11/04/2017   Influenza,inj,Quad PF,6+ Mos 10/25/2014   PFIZER(Purple Top)SARS-COV-2 Vaccination 04/06/2020, 04/27/2020, 01/14/2021   Pneumococcal Conjugate-13 10/25/2014   Pneumococcal Polysaccharide-23 10/28/2010, 09/13/2012, 02/08/2020   Pneumococcal-Unspecified 07/22/2014   Td 10/22/2010   Tdap 05/08/2020     Flu Vaccine status: Up to date  Pneumococcal vaccine status: Up to date  Covid-19 vaccine status: Completed vaccines  Qualifies for Shingles Vaccine? Yes   Zostavax completed No   Shingrix Completed?: No.    Education has been provided regarding the importance of this vaccine. Patient has been advised to call insurance company to determine out of pocket expense if they have not yet received this vaccine. Advised may also receive vaccine at local pharmacy or Health Dept. Verbalized acceptance and understanding.  Screening Tests Health Maintenance  Topic Date Due   Zoster Vaccines- Shingrix (1 of 2) Never done   Diabetic kidney evaluation - Urine ACR  06/26/2023   COVID-19 Vaccine (4 - 2023-24 season) 06/27/2023 (Originally 08/22/2022)   FOOT EXAM  06/28/2023   HEMOGLOBIN A1C  07/21/2023   INFLUENZA VACCINE  07/23/2023   Diabetic kidney evaluation - eGFR measurement  01/21/2024   Medicare Annual Wellness (AWV)  06/10/2024   Colonoscopy  01/29/2026   DTaP/Tdap/Td (3 - Td or Tdap) 05/08/2030   Pneumonia Vaccine 65+ Years  old  Completed   Hepatitis C Screening  Completed   HPV VACCINES  Aged Out    Health Maintenance  Health Maintenance Due  Topic Date Due   Zoster Vaccines- Shingrix (1 of 2) Never done   Diabetic kidney evaluation - Urine ACR  06/26/2023    Colorectal cancer screening: Type of screening: Colonoscopy. Completed 01/29/21. Repeat every 5 years  Lung Cancer Screening: (Low Dose CT Chest recommended if Age 32-80 years, 20 pack-year currently smoking OR have quit w/in 15years.) does not qualify.     Additional Screening:  Hepatitis C Screening: does qualify; Completed 02/24/19  Vision Screening: Recommended annual ophthalmology exams for early detection of glaucoma and other disorders of the eye. Is the patient up to date with their annual eye exam?  Yes  Who is the provider or what is the name of the office in which the patient attends annual eye exams? [facility] If pt is not established with a provider, would they like to be referred to a provider to establish care? No .   Dental Screening: Recommended annual dental exams for proper oral hygiene  Diabetic Foot Exam: Done 06/08/23  Community Resource Referral / Chronic Care Management:  CRR required this visit?  No   CCM required this visit?  No     Plan:     I have personally reviewed and noted the following in the patient's chart:   Medical and social history Use of alcohol, tobacco or illicit drugs  Current medications and supplements including opioid prescriptions. Patient is currently taking opioid prescriptions. Information provided to patient regarding non-opioid alternatives. Patient advised to discuss non-opioid treatment plan with their provider. Functional ability and status Nutritional status Physical activity Advanced directives List of other physicians Hospitalizations, surgeries, and ER visits in previous 12 months Vitals Screenings to include cognitive, depression, and falls Referrals and  appointments  In addition, I have reviewed and discussed with patient certain preventive protocols, quality metrics, and best practice recommendations. A written personalized care plan for preventive services as well as general preventive health recommendations were provided to patient.  Tillie Rung, LPN   [id number]   After Visit Summary: (MyChart) Due to this being a telephonic visit, the after visit summary with patients personalized plan was offered to patient via MyChart   Nurse Notes: None

## 2023-06-11 NOTE — Patient Instructions (Addendum)
[name] , Thank you for taking time to come for your Medicare Wellness Visit. I appreciate your ongoing commitment to your health goals. Please review the following plan we discussed and let me know if I can assist you in the future.   These are the goals we discussed:  Goals       Manage My Diet      Timeframe:  Long-Range Goal Priority:  High Start Date:    05/09/21                         Expected End Date:    07/22/22               Follow Up Date Feb 2022   - ask for help if I have trouble affording healthy foods - choose foods low in fat and sugar - choose foods that are low in sodium (salt) - keep healthy snacks on hand - watch for swelling in feet, ankles and legs every day    Why is this important?   A healthy diet is important for mental and physical health.  Healthy food helps repair damaged body tissue and maintains strong bones and muscles.  No single food is just right so eating a variety of proteins, fruits, vegetables and grains is best.  You may need to change what you eat or drink to manage kidney disease.  A dietitian is the best person to guide you.     Notes:       Manage My Medicine      Timeframe:  Long-Range Goal Priority:  Medium Start Date:    05/09/21                         Expected End Date:    07/22/22              Follow Up Date June 2023   - call for medicine refill 2 or 3 days before it runs out - call if I am sick and can't take my medicine - keep a list of all the medicines I take; vitamins and herbals too    Why is this important?   These steps will help you keep on track with your medicines.   Notes:       No current goals (pt-stated)      Patient Stated      I want to do more things out of the house when the car is fixed like going to church, visiting friends and enjoying life.         This is a list of the screening recommended for you and due dates:  Health Maintenance  Topic Date Due   Zoster (Shingles) Vaccine (1 of 2)  Never done   Yearly kidney health urinalysis for diabetes  06/26/2023   COVID-19 Vaccine (4 - 2023-24 season) 06/27/2023*   Complete foot exam   06/28/2023   Hemoglobin A1C  07/21/2023   Flu Shot  07/23/2023   Yearly kidney function blood test for diabetes  01/21/2024   Medicare Annual Wellness Visit  06/10/2024   Colon Cancer Screening  01/29/2026   DTaP/Tdap/Td vaccine (3 - Td or Tdap) 05/08/2030   Pneumonia Vaccine  Completed   Hepatitis C Screening  Completed   HPV Vaccine  Aged Out  *Topic was postponed. The date shown is not the original due date.   Opioid Pain Medicine Management Opioids are powerful  medicines that are used to treat moderate to severe pain. When used for short periods of time, they can help you to: Sleep better. Do better in physical or occupational therapy. Feel better in the first few days after an injury. Recover from surgery. Opioids should be taken with the supervision of a trained health care provider. They should be taken for the shortest period of time possible. This is because opioids can be addictive, and the longer you take opioids, the greater your risk of addiction. This addiction can also be called opioid use disorder. What are the risks? Using opioid pain medicines for longer than 3 days increases your risk of side effects. Side effects include: Constipation. Nausea and vomiting. Breathing difficulties (respiratory depression). Drowsiness. Confusion. Opioid use disorder. Itching. Taking opioid pain medicine for a long period of time can affect your ability to do daily tasks. It also puts you at risk for: Motor vehicle crashes. Depression. Suicide. Heart attack. Overdose, which can be life-threatening. What is a pain treatment plan? A pain treatment plan is an agreement between you and your health care provider. Pain is unique to each person, and treatments vary depending on your condition. To manage your pain, you and your health care  provider need to work together. To help you do this: Discuss the goals of your treatment, including how much pain you might expect to have and how you will manage the pain. Review the risks and benefits of taking opioid medicines. Remember that a good treatment plan uses more than one approach and minimizes the chance of side effects. Be honest about the amount of medicines you take and about any drug or alcohol use. Get pain medicine prescriptions from only one health care provider. Pain can be managed with many types of alternative treatments. Ask your health care provider to refer you to one or more specialists who can help you manage pain through: Physical or occupational therapy. Counseling (cognitive behavioral therapy). Good nutrition. Biofeedback. Massage. Meditation. Non-opioid medicine. Following a gentle exercise program. How to use opioid pain medicine Taking medicine Take your pain medicine exactly as told by your health care provider. Take it only when you need it. If your pain gets less severe, you may take less than your prescribed dose if your health care provider approves. If you are not having pain, do nottake pain medicine unless your health care provider tells you to take it. If your pain is severe, do nottry to treat it yourself by taking more pills than instructed on your prescription. Contact your health care provider for help. Write down the times when you take your pain medicine. It is easy to become confused while on pain medicine. Writing the time can help you avoid overdose. Take other over-the-counter or prescription medicines only as told by your health care provider. Keeping yourself and others safe  While you are taking opioid pain medicine: Do not drive, use machinery, or power tools. Do not sign legal documents. Do not drink alcohol. Do not take sleeping pills. Do not supervise children by yourself. Do not do activities that require climbing or being  in high places. Do not go to a lake, river, ocean, spa, or swimming pool. Do not share your pain medicine with anyone. Keep pain medicine in a locked cabinet or in a secure area where pets and children cannot reach it. Stopping your use of opioids If you have been taking opioid medicine for more than a few weeks, you may need to slowly decrease (  taper) how much you take until you stop completely. Tapering your use of opioids can decrease your risk of symptoms of withdrawal, such as: Pain and cramping in the abdomen. Nausea. Sweating. Sleepiness. Restlessness. Uncontrollable shaking (tremors). Cravings for the medicine. Do not attempt to taper your use of opioids on your own. Talk with your health care provider about how to do this. Your health care provider may prescribe a step-down schedule based on how much medicine you are taking and how long you have been taking it. Getting rid of leftover pills Do not save any leftover pills. Get rid of leftover pills safely by: Taking the medicine to a prescription take-back program. This is usually offered by the county or law enforcement. Bringing them to a pharmacy that has a drug disposal container. Flushing them down the toilet. Check the label or package insert of your medicine to see whether this is safe to do. Throwing them out in the trash. Check the label or package insert of your medicine to see whether this is safe to do. If it is safe to throw it out, remove the medicine from the original container, put it into a sealable bag or container, and mix it with used coffee grounds, food scraps, dirt, or cat litter before putting it in the trash. Follow these instructions at home: Activity Do exercises as told by your health care provider. Avoid activities that make your pain worse. Return to your normal activities as told by your health care provider. Ask your health care provider what activities are safe for you. General instructions You may  need to take these actions to prevent or treat constipation: Drink enough fluid to keep your urine pale yellow. Take over-the-counter or prescription medicines. Eat foods that are high in fiber, such as beans, whole grains, and fresh fruits and vegetables. Limit foods that are high in fat and processed sugars, such as fried or sweet foods. Keep all follow-up visits. This is important. Where to find support If you have been taking opioids for a long time, you may benefit from receiving support for quitting from a local support group or counselor. Ask your health care provider for a referral to these resources in your area. Where to find more information Centers for Disease Control and Prevention (CDC): FootballExhibition.com.br [place] Food and Drug Administration (FDA): PumpkinSearch.com.ee Get help right away if: You may have taken too much of an opioid (overdosed). Common symptoms of an overdose: Your breathing is slower or more shallow than normal. You have a very slow heartbeat (pulse). You have slurred speech. You have nausea and vomiting. Your pupils become very small. You have other potential symptoms: You are very confused. You faint or feel like you will faint. You have cold, clammy skin. You have blue lips or fingernails. You have thoughts of harming yourself or harming others. These symptoms may represent a serious problem that is an emergency. Do not wait to see if the symptoms will go away. Get medical help right away. Call your local emergency services (911 in the [place] Do not drive yourself to the hospital.  If you ever feel like you may hurt yourself or others, or have thoughts about taking your own life, get help right away. Go to your nearest emergency department or: Call your local emergency services (911 in the [place] Call the [facility] (949 122 0985 in the [place] Call a suicide crisis helpline, such as the National Suicide Prevention Lifeline at 4303235843 or 988 in  the [place]  This is open 24 hours a day in the [place] Text the Crisis Text Line at 417-586-2521 (in the [place] Summary Opioid medicines can help you manage moderate to severe pain for a short period of time. A pain treatment plan is an agreement between you and your health care provider. Discuss the goals of your treatment, including how much pain you might expect to have and how you will manage the pain. If you think that you or someone else may have taken too much of an opioid, get medical help right away. This information is not intended to replace advice given to you by your health care provider. Make sure you discuss any questions you have with your health care provider. Document Revised: 07/03/2021 Document Reviewed: 03/20/2021 Elsevier Patient Education  2024 Else[employer].  Advanced directives: Advance directive discussed with you today. Even though you declined this today, please call our office should you change your mind, and we can give you the proper paperwork for you to fill out.   Conditions/risks identified: None  Next appointment: Follow up in one year for your annual wellness visit.   Preventive Care 46 Years and Older, Male  Preventive care refers to lifestyle choices and visits with your health care provider that can promote health and wellness. What does preventive care include? A yearly physical exam. This is also called an annual well check. Dental exams once or twice a year. Routine eye exams. Ask your health care provider how often you should have your eyes checked. Personal lifestyle choices, including: Daily care of your teeth and gums. Regular physical activity. Eating a healthy diet. Avoiding tobacco and drug use. Limiting alcohol use. Practicing safe sex. Taking low doses of aspirin every day. Taking vitamin and mineral supplements as recommended by your health care provider. What happens during an annual well check? The services and screenings done by your health  care provider during your annual well check will depend on your age, overall health, lifestyle risk factors, and family history of disease. Counseling  Your health care provider may ask you questions about your: Alcohol use. Tobacco use. Drug use. Emotional well-being. Home and relationship well-being. Sexual activity. Eating habits. History of falls. Memory and ability to understand (cognition). Work and work Astronomer. Screening  You may have the following tests or measurements: Height, weight, and BMI. Blood pressure. Lipid and cholesterol levels. These may be checked every 5 years, or more frequently if you are over 57 years old. Skin check. Lung cancer screening. You may have this screening every year starting at age 92 if you have a 30-pack-year history of smoking and currently smoke or have quit within the past 15 years. Fecal occult blood test (FOBT) of the stool. You may have this test every year starting at age 49. Flexible sigmoidoscopy or colonoscopy. You may have a sigmoidoscopy every 5 years or a colonoscopy every 10 years starting at age 78. Prostate cancer screening. Recommendations will vary depending on your family history and other risks. Hepatitis C blood test. Hepatitis B blood test. Sexually transmitted disease (STD) testing. Diabetes screening. This is done by checking your blood sugar (glucose) after you have not eaten for a while (fasting). You may have this done every 1-3 years. Abdominal aortic aneurysm (AAA) screening. You may need this if you are a current or former smoker. Osteoporosis. You may be screened starting at age 72 if you are at high risk. Talk with your health care provider about your test results, treatment options, and  if necessary, the need for more tests. Vaccines  Your health care provider may recommend certain vaccines, such as: Influenza vaccine. This is recommended every year. Tetanus, diphtheria, and acellular pertussis (Tdap, Td)  vaccine. You may need a Td booster every 10 years. Zoster vaccine. You may need this after age 64. Pneumococcal 13-valent conjugate (PCV13) vaccine. One dose is recommended after age 10. Pneumococcal polysaccharide (PPSV23) vaccine. One dose is recommended after age 48. Talk to your health care provider about which screenings and vaccines you need and how often you need them. This information is not intended to replace advice given to you by your health care provider. Make sure you discuss any questions you have with your health care provider. Document Released: 01/04/2016 Document Revised: 08/27/2016 Document Reviewed: 10/09/2015 Elsevier Interactive Patient Education  2017 [employer].  Fall Prevention in the Home Falls can cause injuries. They can happen to people of all ages. There are many things you can do to make your home safe and to help prevent falls. What can I do on the outside of my home? Regularly fix the edges of walkways and driveways and fix any cracks. Remove anything that might make you trip as you walk through a door, such as a raised step or threshold. Trim any bushes or trees on the path to your home. Use bright outdoor lighting. Clear any walking paths of anything that might make someone trip, such as rocks or tools. Regularly check to see if handrails are loose or broken. Make sure that both sides of any steps have handrails. Any raised decks and porches should have guardrails on the edges. Have any leaves, snow, or ice cleared regularly. Use sand or salt on walking paths during winter. Clean up any spills in your garage right away. This includes oil or grease spills. What can I do in the bathroom? Use night lights. Install grab bars by the toilet and in the tub and shower. Do not use towel bars as grab bars. Use non-skid mats or decals in the tub or shower. If you need to sit down in the shower, use a plastic, non-slip stool. Keep the floor dry. Clean up any  water that spills on the floor as soon as it happens. Remove soap buildup in the tub or shower regularly. Attach bath mats securely with double-sided non-slip rug tape. Do not have throw rugs and other things on the floor that can make you trip. What can I do in the bedroom? Use night lights. Make sure that you have a light by your bed that is easy to reach. Do not use any sheets or blankets that are too big for your bed. They should not hang down onto the floor. Have a firm chair that has side arms. You can use this for support while you get dressed. Do not have throw rugs and other things on the floor that can make you trip. What can I do in the kitchen? Clean up any spills right away. Avoid walking on wet floors. Keep items that you use a lot in easy-to-reach places. If you need to reach something above you, use a strong step stool that has a grab bar. Keep electrical cords out of the way. Do not use floor polish or wax that makes floors slippery. If you must use wax, use non-skid floor wax. Do not have throw rugs and other things on the floor that can make you trip. What can I do with my stairs? Do not leave any  items on the stairs. Make sure that there are handrails on both sides of the stairs and use them. Fix handrails that are broken or loose. Make sure that handrails are as long as the stairways. Check any carpeting to make sure that it is firmly attached to the stairs. Fix any carpet that is loose or worn. Avoid having throw rugs at the top or bottom of the stairs. If you do have throw rugs, attach them to the floor with carpet tape. Make sure that you have a light switch at the top of the stairs and the bottom of the stairs. If you do not have them, ask someone to add them for you. What else can I do to help prevent falls? Wear shoes that: Do not have high heels. Have rubber bottoms. Are comfortable and fit you well. Are closed at the toe. Do not wear sandals. If you use a  stepladder: Make sure that it is fully opened. Do not climb a closed stepladder. Make sure that both sides of the stepladder are locked into place. Ask someone to hold it for you, if possible. Clearly mark and make sure that you can see: Any grab bars or handrails. First and last steps. Where the edge of each step is. Use tools that help you move around (mobility aids) if they are needed. These include: Canes. Walkers. Scooters. Crutches. Turn on the lights when you go into a dark area. Replace any light bulbs as soon as they burn out. Set up your furniture so you have a clear path. Avoid moving your furniture around. If any of your floors are uneven, fix them. If there are any pets around you, be aware of where they are. Review your medicines with your doctor. Some medicines can make you feel dizzy. This can increase your chance of falling. Ask your doctor what other things that you can do to help prevent falls. This information is not intended to replace advice given to you by your health care provider. Make sure you discuss any questions you have with your health care provider. Document Released: 10/04/2009 Document Revised: 05/15/2016 Document Reviewed: 01/12/2015 Elsevier Interactive Patient Education  2017 [employer].

## 2023-06-12 ENCOUNTER — Telehealth (INDEPENDENT_AMBULATORY_CARE_PROVIDER_SITE_OTHER): Payer: Medicare PPO | Admitting: Podiatry

## 2023-06-12 ENCOUNTER — Other Ambulatory Visit: Payer: Self-pay | Admitting: Internal Medicine

## 2023-06-12 NOTE — Telephone Encounter (Signed)
Phoned to inform patient of ABI results. No answer. Will attempt to call again on Monday. No urgency.

## 2023-06-15 DIAGNOSIS — E119 Type 2 diabetes mellitus without complications: Secondary | ICD-10-CM | POA: Diagnosis not present

## 2023-06-15 DIAGNOSIS — E118 Type 2 diabetes mellitus with unspecified complications: Secondary | ICD-10-CM | POA: Diagnosis not present

## 2023-06-15 NOTE — Telephone Encounter (Signed)
[employer] confirmed receipt of order on 06/05/23. 

## 2023-06-19 DIAGNOSIS — G4733 Obstructive sleep apnea (adult) (pediatric): Secondary | ICD-10-CM | POA: Diagnosis not present

## 2023-06-23 ENCOUNTER — Encounter: Payer: Self-pay | Admitting: Internal Medicine

## 2023-06-23 ENCOUNTER — Ambulatory Visit (INDEPENDENT_AMBULATORY_CARE_PROVIDER_SITE_OTHER): Payer: Medicare PPO | Admitting: Internal Medicine

## 2023-06-23 VITALS — BP 156/76 | HR 64 | Temp 98.4°F | Ht 71.0 in | Wt 221.0 lb

## 2023-06-23 DIAGNOSIS — Z794 Long term (current) use of insulin: Secondary | ICD-10-CM

## 2023-06-23 DIAGNOSIS — D631 Anemia in chronic kidney disease: Secondary | ICD-10-CM

## 2023-06-23 DIAGNOSIS — N401 Enlarged prostate with lower urinary tract symptoms: Secondary | ICD-10-CM | POA: Diagnosis not present

## 2023-06-23 DIAGNOSIS — D638 Anemia in other chronic diseases classified elsewhere: Secondary | ICD-10-CM

## 2023-06-23 DIAGNOSIS — E119 Type 2 diabetes mellitus without complications: Secondary | ICD-10-CM

## 2023-06-23 DIAGNOSIS — E785 Hyperlipidemia, unspecified: Secondary | ICD-10-CM

## 2023-06-23 DIAGNOSIS — N182 Chronic kidney disease, stage 2 (mild): Secondary | ICD-10-CM | POA: Diagnosis not present

## 2023-06-23 DIAGNOSIS — I10 Essential (primary) hypertension: Secondary | ICD-10-CM

## 2023-06-23 DIAGNOSIS — E1122 Type 2 diabetes mellitus with diabetic chronic kidney disease: Secondary | ICD-10-CM

## 2023-06-23 DIAGNOSIS — E1121 Type 2 diabetes mellitus with diabetic nephropathy: Secondary | ICD-10-CM | POA: Diagnosis not present

## 2023-06-23 DIAGNOSIS — E0849 Diabetes mellitus due to underlying condition with other diabetic neurological complication: Secondary | ICD-10-CM | POA: Diagnosis not present

## 2023-06-23 DIAGNOSIS — I2510 Atherosclerotic heart disease of native coronary artery without angina pectoris: Secondary | ICD-10-CM | POA: Diagnosis not present

## 2023-06-23 DIAGNOSIS — R3912 Poor urinary stream: Secondary | ICD-10-CM | POA: Diagnosis not present

## 2023-06-23 DIAGNOSIS — D510 Vitamin B12 deficiency anemia due to intrinsic factor deficiency: Secondary | ICD-10-CM

## 2023-06-23 DIAGNOSIS — E781 Pure hyperglyceridemia: Secondary | ICD-10-CM | POA: Diagnosis not present

## 2023-06-23 DIAGNOSIS — S81002A Unspecified open wound, left knee, initial encounter: Secondary | ICD-10-CM | POA: Insufficient documentation

## 2023-06-23 DIAGNOSIS — D508 Other iron deficiency anemias: Secondary | ICD-10-CM

## 2023-06-23 DIAGNOSIS — G4733 Obstructive sleep apnea (adult) (pediatric): Secondary | ICD-10-CM | POA: Diagnosis not present

## 2023-06-23 LAB — HEPATIC FUNCTION PANEL
ALT: 12 U/L (ref 0–53)
AST: 12 U/L (ref 0–37)
Albumin: 4.2 g/dL (ref 3.5–5.2)
Alkaline Phosphatase: 61 U/L (ref 39–117)
Bilirubin, Direct: 0.1 mg/dL (ref 0.0–0.3)
Total Bilirubin: 0.6 mg/dL (ref 0.2–1.2)
Total Protein: 7.3 g/dL (ref 6.0–8.3)

## 2023-06-23 LAB — MICROALBUMIN / CREATININE URINE RATIO
Creatinine,U: 66.9 mg/dL
Microalb Creat Ratio: 1.4 mg/g (ref 0.0–30.0)
Microalb, Ur: 0.9 mg/dL (ref 0.0–1.9)

## 2023-06-23 LAB — BASIC METABOLIC PANEL
BUN: 16 mg/dL (ref 6–23)
CO2: 25 mEq/L (ref 19–32)
Calcium: 8.8 mg/dL (ref 8.4–10.5)
Chloride: 103 mEq/L (ref 96–112)
Creatinine, Ser: 1.27 mg/dL (ref 0.40–1.50)
GFR: 55.92 mL/min — ABNORMAL LOW (ref >60.00)
Glucose, Bld: 168 mg/dL — ABNORMAL HIGH (ref 70–99)
Potassium: 4.2 mEq/L (ref 3.5–5.1)
Sodium: 136 mEq/L (ref 135–145)

## 2023-06-23 LAB — URINALYSIS, ROUTINE W REFLEX MICROSCOPIC
Bilirubin Urine: NEGATIVE
Hgb urine dipstick: NEGATIVE
Ketones, ur: NEGATIVE
Leukocytes,Ua: NEGATIVE
Nitrite: NEGATIVE
RBC / HPF: NONE SEEN (ref 0–?)
Specific Gravity, Urine: 1.01 (ref 1.000–1.030)
Total Protein, Urine: NEGATIVE
Urine Glucose: >=1000 — AB
Urobilinogen, UA: 0.2 (ref 0.0–1.0)
pH: 6 (ref 5.0–8.0)

## 2023-06-23 LAB — TSH: TSH: 2.83 u[IU]/mL (ref 0.35–5.50)

## 2023-06-23 LAB — LIPID PANEL
Cholesterol: 111 mg/dL (ref 0–200)
HDL: 27.6 mg/dL — ABNORMAL LOW (ref >39.00)
LDL Cholesterol: 58 mg/dL (ref 0–99)
NonHDL: 83.81
Total CHOL/HDL Ratio: 4
Triglycerides: 131 mg/dL (ref 0.0–149.0)
VLDL: 26.2 mg/dL (ref 0.0–40.0)

## 2023-06-23 LAB — IBC + FERRITIN
Ferritin: 11.5 ng/mL — ABNORMAL LOW (ref 22.0–322.0)
Iron: 38 ug/dL — ABNORMAL LOW (ref 42–165)
Saturation Ratios: 9.8 % — ABNORMAL LOW (ref 20.0–50.0)
TIBC: 386.4 ug/dL (ref 250.0–450.0)
Transferrin: 276 mg/dL (ref 212.0–360.0)

## 2023-06-23 LAB — CBC WITH DIFFERENTIAL/PLATELET
Basophils Absolute: 0 10*3/uL (ref 0.0–0.1)
Basophils Relative: 0.6 % (ref 0.0–3.0)
Eosinophils Absolute: 0.2 10*3/uL (ref 0.0–0.7)
Eosinophils Relative: 3.2 % (ref 0.0–5.0)
HCT: 39.2 % (ref 39.0–52.0)
Hemoglobin: 12.3 g/dL — ABNORMAL LOW (ref 13.0–17.0)
Lymphocytes Relative: 37.4 % (ref 12.0–46.0)
Lymphs Abs: 2.2 10*3/uL (ref 0.7–4.0)
MCHC: 31.3 g/dL (ref 30.0–36.0)
MCV: 70.6 fl — ABNORMAL LOW (ref 78.0–100.0)
Monocytes Absolute: 0.4 10*3/uL (ref 0.1–1.0)
Monocytes Relative: 7.2 % (ref 3.0–12.0)
Neutro Abs: 3 10*3/uL (ref 1.4–7.7)
Neutrophils Relative %: 51.6 % (ref 43.0–77.0)
Platelets: 254 10*3/uL (ref 150.0–400.0)
RBC: 5.55 Mil/uL (ref 4.22–5.81)
RDW: 18 % — ABNORMAL HIGH (ref 11.5–15.5)
WBC: 5.8 10*3/uL (ref 4.0–10.5)

## 2023-06-23 LAB — PSA: PSA: 3.81 ng/mL (ref 0.10–4.00)

## 2023-06-23 LAB — HEMOGLOBIN A1C: Hgb A1c MFr Bld: 6.9 % — ABNORMAL HIGH (ref 4.6–6.5)

## 2023-06-23 MED ORDER — CYANOCOBALAMIN 1000 MCG/ML IJ SOLN
1000.0000 ug | Freq: Once | INTRAMUSCULAR | Status: AC
Start: 2023-06-23 — End: 2023-06-23
  Administered 2023-06-23: 1000 ug via INTRAMUSCULAR

## 2023-06-23 NOTE — Progress Notes (Signed)
Subjective:  Patient ID: [name], male    DOB: 1951-09-07  Age: 72 y.o. MRN: [id number]  CC: Anemia, Hypothyroidism, Hyperlipidemia, Hypertension, and Diabetes   HPI [name] presents for f/up ----  Discussed the use of AI scribe software for clinical note transcription with the patient, who gave verbal consent to proceed.  History of Present Illness   The patient, with a history of balance issues, reports several falls in the past two to three months, the most recent of which resulted in skinned left knee.  They also report numbness in their hands and have recently undergone testing for nerve damage. An ultrasound was performed on their feet about a month ago, possibly to assess blood flow, but the patient has not yet received the results.  The patient denies experiencing pain in their legs while walking but reports leg cramps when lying down at night or sometimes while sitting. They have to reposition their leg and try to stretch it out to alleviate the cramps. They have also experienced shortness of breath, although a previous stress test was reportedly normal. They deny any chest pain, coughing, or wheezing.  They deny abdominal pain, nausea, or vomiting. They report a significant weight loss down to 220 pounds, although this was not intentional. They have been trying to maintain their current weight. Bowel movements are reported as normal.       Outpatient Medications Prior to Visit  Medication Sig Dispense Refill   ASPIRIN LOW DOSE 81 MG tablet TAKE 1 TABLET (81 MG TOTAL) BY MOUTH DAILY. SWALLOW WHOLE. 90 tablet 1   atorvastatin (LIPITOR) 80 MG tablet Take 1 tablet by mouth daily at 6pm. 90 tablet 1   carvedilol (COREG) 12.5 MG tablet Take one (1) tablet by mouth ( 12.5 mg ) twice daily. 180 tablet 3   Cholecalciferol (VITAMIN D3) 25 MCG (1000 UT) CAPS Take by mouth.     Continuous Blood Gluc Receiver (FREESTYLE LIBRE 3 READER) DEVI 1 Act by Does not apply route  daily. 1 each 3   Continuous Blood Gluc Sensor (FREESTYLE LIBRE 3 SENSOR) MISC 1 Act by Does not apply route daily. Place 1 sensor on the skin every 14 days. Use to check glucose continuously 2 each 5   empagliflozin (JARDIANCE) 25 MG TABS tablet Take 1 tablet (25 mg total) by mouth daily. 90 tablet 1   Evolocumab (REPATHA) 140 MG/ML SOSY Inject 1 mL into the skin every 14 (fourteen) days. 6.3 mL 1   fluticasone (FLONASE) 50 MCG/ACT nasal spray SPRAY 2 SPRAYS INTO EACH NOSTRIL EVERY DAY 48 mL 3   gabapentin (NEURONTIN) 300 MG capsule Take  capsule three times a day 270 capsule 2   Glucagon 1 MG/0.2ML SOAJ Inject 1 Act into the skin once as needed for up to 1 dose. 0.2 mL 3   icosapent Ethyl (VASCEPA) 1 g capsule Take 2 capsules (2 g total) by mouth 2 (two) times daily. 360 capsule 1   indapamide (LOZOL) 1.25 MG tablet Take 1.25 mg by mouth daily.     Insulin Pen Needle (BD PEN NEEDLE NANO 2ND GEN) 32G X 4 MM MISC Inject 1 Act into the skin daily. 100 each 3   isosorbide mononitrate (IMDUR) 120 MG 24 hr tablet Take 1 tablet (120 mg total) by mouth daily. 90 tablet 1   levocetirizine (XYZAL) 5 MG tablet TAKE 1 TABLET BY MOUTH EVERY DAY IN THE EVENING 90 tablet 3  metFORMIN (GLUCOPHAGE) 500 MG tablet TAKE 1 TABLET TWICE DAILY WITH MEALS 180 tablet 0   nitroGLYCERIN (NITROSTAT) 0.4 MG SL tablet Place 1 tablet (0.4 mg total) under the tongue every 5 (five) minutes as needed for chest pain. Do not exceed total of 3 dose in 15 minutes. Please make overdue appt with Dr. Anne Fu before anymore refills. Thank you 1st attempt 25 tablet 0   pantoprazole (PROTONIX) 40 MG tablet TAKE 1 TABLET TWICE DAILY 180 tablet 0   ranolazine (RANEXA) 500 MG 12 hr tablet TAKE 1 TABLET TWICE DAILY 180 tablet 0   SOLIQUA 100-33 UNT-MCG/ML SOPN INJECT 60 UNITS INTO THE SKIN DAILY. 45 mL 1   tamsulosin (FLOMAX) 0.4 MG CAPS capsule Take 0.4 mg by mouth once.     traMADol (ULTRAM) 50 MG tablet TAKE 1 TABLET BY MOUTH EVERY 8 HOURS  AS NEEDED 90 tablet 2   No facility-administered medications prior to visit.    ROS Review of Systems  Constitutional:  Negative for appetite change, chills, diaphoresis, fatigue, fever and unexpected weight change.  HENT: Negative.    Respiratory:  Positive for shortness of breath. Negative for cough, chest tightness and wheezing.   Cardiovascular:  Negative for chest pain, palpitations and leg swelling.  Gastrointestinal:  Negative for abdominal pain, blood in stool, constipation, diarrhea, nausea and vomiting.  Genitourinary: Negative.  Negative for difficulty urinating.  Musculoskeletal:  Positive for gait problem. Negative for arthralgias, joint swelling and myalgias.  Skin:  Positive for wound. Negative for color change.  Neurological:  Positive for weakness and numbness. Negative for dizziness, speech difficulty and light-headedness.  Hematological:  Negative for adenopathy. Does not bruise/bleed easily.  Psychiatric/Behavioral: Negative.      Objective:  BP (!) 156/76 (BP Location: Right Arm, Patient Position: Sitting, Cuff Size: Large)   Pulse 64   Temp 98.4 F (36.9 C) (Oral)   Ht 5\' 11"  (1.803 m)   Wt 221 lb (100.2 kg)   SpO2 95%   BMI 30.82 kg/m   BP Readings from Last 3 Encounters:  06/27/23 (!) 156/76  04/22/23 (!) 147/72  04/02/23 (!) 152/84    Wt Readings from Last 3 Encounters:  06/23/23 221 lb (100.2 kg)  06/11/23 220 lb (99.8 kg)  04/02/23 220 lb 3.2 oz (99.9 kg)    Physical Exam Vitals reviewed.  Constitutional:      General: He is not in acute distress.    Appearance: He is obese. He is ill-appearing. He is not toxic-appearing or diaphoretic.  HENT:     Nose: Nose normal.     Mouth/Throat:     Mouth: Mucous membranes are moist.  Eyes:     General: No scleral icterus.    Conjunctiva/sclera: Conjunctivae normal.  Cardiovascular:     Rate and Rhythm: Normal rate and regular rhythm.     Heart sounds: No murmur heard. Pulmonary:     Effort:  Pulmonary effort is normal.     Breath sounds: No stridor. No wheezing, rhonchi or rales.  Abdominal:     General: Abdomen is protuberant. Bowel sounds are normal. There is no distension.     Palpations: Abdomen is soft. There is no hepatomegaly, splenomegaly or mass.     Tenderness: There is no abdominal tenderness.  Musculoskeletal:        General: Normal range of motion.     Cervical back: Neck supple.     Right knee: Normal.     Left knee: Deformity (abrasion) present. No swelling,  effusion, erythema, ecchymosis, bony tenderness or crepitus. Normal range of motion. No tenderness.     Right lower leg: Edema (trace pitting) present.     Left lower leg: Edema (trace pitting) present.  Lymphadenopathy:     Cervical: No cervical adenopathy.  Skin:    General: Skin is warm and dry.     Coloration: Skin is not pale.  Neurological:     Mental Status: He is alert. Mental status is at baseline.     Cranial Nerves: No cranial nerve deficit.     Sensory: Sensory deficit present.     Motor: Weakness present.     Coordination: Coordination abnormal.     Gait: Gait abnormal.     Deep Tendon Reflexes: Reflexes abnormal.  Psychiatric:        Mood and Affect: Mood normal.        Behavior: Behavior normal.     Lab Results  Component Value Date   WBC 5.8 06/23/2023   HGB 12.3 (L) 06/23/2023   HCT 39.2 06/23/2023   PLT 254.0 06/23/2023   GLUCOSE 168 (H) 06/23/2023   CHOL 111 06/23/2023   TRIG 131.0 06/23/2023   HDL 27.60 (L) 06/23/2023   LDLDIRECT 82.7 04/04/2013   LDLCALC 58 06/23/2023   ALT 12 06/23/2023   AST 12 06/23/2023   NA 136 06/23/2023   K 4.2 06/23/2023   CL 103 06/23/2023   CREATININE 1.27 06/23/2023   BUN 16 06/23/2023   CO2 25 06/23/2023   TSH 2.83 06/23/2023   PSA 3.81 06/23/2023   INR 0.95 08/17/2010   HGBA1C 6.9 (H) 06/23/2023   MICROALBUR 0.9 06/23/2023    VAS [place] ABI WITH/WO TBI  Result Date: 06/08/2023  LOWER EXTREMITY DOPPLER STUDY Patient Name:  EBON STABLES  Date of Exam:   06/08/2023 Medical Rec #: [id number]      Accession #:    [id number] Date of Birth: 09/03/1951      Patient Gender: M Patient Age:   18 years Exam Location:  [facility] Procedure:      VAS [place] ABI WITH/WO TBI Referring Phys: Geralynn Rile --------------------------------------------------------------------------------  Indications: Claudication. High Risk Factors: Hypertension, Diabetes, no history of smoking, coronary                    artery disease. Other Factors: CHF, OSA, Diabetic Neuropathy, CKD stage 2.  Performing Technologist: Lowell Guitar RVT, RDMS  Examination Guidelines: A complete evaluation includes at minimum, Doppler waveform signals and systolic blood pressure reading at the level of bilateral brachial, anterior tibial, and posterior tibial arteries, when vessel segments are accessible. Bilateral testing is considered an integral part of a complete examination. Photoelectric Plethysmograph (PPG) waveforms and toe systolic pressure readings are included as required and additional duplex testing as needed. Limited examinations for reoccurring indications may be performed as noted.  ABI Findings: +---------+------------------+-----+-----------+--------+ Right    Rt Pressure (mmHg)IndexWaveform   Comment  +---------+------------------+-----+-----------+--------+ Brachial 160                                        +---------+------------------+-----+-----------+--------+ PTA      112               0.70 monophasic          +---------+------------------+-----+-----------+--------+ DP       110  0.69 multiphasic         +---------+------------------+-----+-----------+--------+ Great Toe104               0.65 Normal              +---------+------------------+-----+-----------+--------+ +---------+------------------+-----+-----------+-------+ Left     Lt Pressure (mmHg)IndexWaveform   Comment  +---------+------------------+-----+-----------+-------+ Brachial 160                                       +---------+------------------+-----+-----------+-------+ PTA      156               0.98 monophasic         +---------+------------------+-----+-----------+-------+ DP       151               0.94 multiphasic        +---------+------------------+-----+-----------+-------+ Great Toe118               0.74 Normal             +---------+------------------+-----+-----------+-------+ +-------+-----------+-----------+------------+------------+ ABI/TBIToday's ABIToday's TBIPrevious ABIPrevious TBI +-------+-----------+-----------+------------+------------+ Right  0.70       0.65                                +-------+-----------+-----------+------------+------------+ Left   0.98       0.74                                +-------+-----------+-----------+------------+------------+  Arterial wall calcification precludes accurate ankle pressures and ABIs. PPG tracings display appropriate pulsatility.  Summary: Right: Resting right ankle-brachial index indicates moderate right lower extremity arterial disease. The right toe-brachial index is abnormal. Left: Resting left ankle-brachial index is within normal range. The left toe-brachial index is normal. *See table(s) above for measurements and observations.  Electronically signed by Coral Else MD on 06/08/2023 at 4:08:55 PM.    Final     Assessment & Plan:   Vitamin B12 deficiency anemia due to intrinsic factor deficiency -     CBC with Differential/Platelet; Future -     Vitamin B1; Future -     Cyanocobalamin  Benign prostatic hyperplasia with weak urinary stream -     Urinalysis, Routine w reflex microscopic; Future -     PSA; Future  CKD (chronic kidney disease) stage 2, GFR 60-89 ml/min- Renal function is stable -     Basic metabolic panel; Future  Other diabetic neurological complication associated with  diabetes mellitus due to underlying condition (HCC) -     Basic metabolic panel; Future  Dyslipidemia, goal LDL below 70- LDL goal achieved. Doing well on the statin  -     Lipid panel; Future -     TSH; Future -     Hepatic function panel; Future -     Lipoprotein A (LPA); Future  Primary hypertension -     CBC with Differential/Platelet; Future -     Basic metabolic panel; Future -     TSH; Future  Insulin-requiring or dependent type II diabetes mellitus (HCC)-  Blood sugar is well controlled. -     Basic metabolic panel; Future -     Hemoglobin A1c; Future -     Microalbumin / creatinine urine ratio; Future -     HM Diabetes Foot Exam  Pure hyperglyceridemia -     Lipid panel; Future -     Hepatic function panel; Future  Atherosclerosis of native coronary artery without angina pectoris, unspecified whether native or transplanted heart -     Lipoprotein A (LPA); Future  Anemia, chronic disease -     Vitamin B1; Future -     IBC + Ferritin; Future  Open knee wound, left, initial encounter -     AMB referral to wound care center  Iron deficiency anemia secondary to inadequate dietary iron intake -     ACCRUFeR; Take 1 capsule (30 mg total) by mouth in the morning and at bedtime.  Dispense: 180 capsule; Refill: 1     Follow-up: Return in about 4 months (around 10/24/2023).  Sanda Linger, MD

## 2023-06-27 DIAGNOSIS — D508 Other iron deficiency anemias: Secondary | ICD-10-CM | POA: Insufficient documentation

## 2023-06-27 LAB — LIPOPROTEIN A (LPA): Lipoprotein (a): 90 nmol/L — ABNORMAL HIGH (ref <75)

## 2023-06-27 LAB — VITAMIN B1: Vitamin B1 (Thiamine): 14 nmol/L (ref 8–30)

## 2023-06-27 MED ORDER — ACCRUFER 30 MG PO CAPS
1.0000 | ORAL_CAPSULE | Freq: Two times a day (BID) | ORAL | 1 refills | Status: DC
Start: 2023-06-27 — End: 2023-08-29

## 2023-06-29 ENCOUNTER — Telehealth: Payer: Self-pay | Admitting: Internal Medicine

## 2023-06-29 NOTE — Telephone Encounter (Signed)
Pt wife called wanting to know why Ferric Maltol (ACCRUFER) 30 MG CAPS was prescribed and why it was sent to [facility]. Please advise.

## 2023-06-29 NOTE — Telephone Encounter (Signed)
Pt and his wife has been informed that Accrufer was Rx'd for his iron/anemia. I explained that [facility] is a Pharmacologist that works specifically with [facility] surgeon and have provided the contact number.

## 2023-07-02 ENCOUNTER — Other Ambulatory Visit: Payer: Self-pay | Admitting: Internal Medicine

## 2023-07-02 DIAGNOSIS — I2510 Atherosclerotic heart disease of native coronary artery without angina pectoris: Secondary | ICD-10-CM

## 2023-07-02 DIAGNOSIS — I5042 Chronic combined systolic (congestive) and diastolic (congestive) heart failure: Secondary | ICD-10-CM

## 2023-07-02 DIAGNOSIS — E785 Hyperlipidemia, unspecified: Secondary | ICD-10-CM

## 2023-07-02 DIAGNOSIS — E118 Type 2 diabetes mellitus with unspecified complications: Secondary | ICD-10-CM

## 2023-07-21 ENCOUNTER — Ambulatory Visit: Payer: Medicare PPO | Admitting: Internal Medicine

## 2023-07-23 ENCOUNTER — Encounter (HOSPITAL_BASED_OUTPATIENT_CLINIC_OR_DEPARTMENT_OTHER): Payer: Medicare PPO | Attending: Internal Medicine | Admitting: Internal Medicine

## 2023-07-23 DIAGNOSIS — L97821 Non-pressure chronic ulcer of other part of left lower leg limited to breakdown of skin: Secondary | ICD-10-CM

## 2023-07-23 DIAGNOSIS — T798XXA Other early complications of trauma, initial encounter: Secondary | ICD-10-CM | POA: Diagnosis not present

## 2023-07-23 DIAGNOSIS — E11622 Type 2 diabetes mellitus with other skin ulcer: Secondary | ICD-10-CM | POA: Diagnosis not present

## 2023-07-23 DIAGNOSIS — H409 Unspecified glaucoma: Secondary | ICD-10-CM | POA: Insufficient documentation

## 2023-07-23 DIAGNOSIS — E1140 Type 2 diabetes mellitus with diabetic neuropathy, unspecified: Secondary | ICD-10-CM | POA: Insufficient documentation

## 2023-07-23 DIAGNOSIS — Z833 Family history of diabetes mellitus: Secondary | ICD-10-CM | POA: Diagnosis not present

## 2023-07-23 DIAGNOSIS — I2510 Atherosclerotic heart disease of native coronary artery without angina pectoris: Secondary | ICD-10-CM | POA: Diagnosis not present

## 2023-07-23 DIAGNOSIS — I110 Hypertensive heart disease with heart failure: Secondary | ICD-10-CM | POA: Insufficient documentation

## 2023-07-23 DIAGNOSIS — G4733 Obstructive sleep apnea (adult) (pediatric): Secondary | ICD-10-CM | POA: Diagnosis not present

## 2023-07-23 DIAGNOSIS — I509 Heart failure, unspecified: Secondary | ICD-10-CM | POA: Diagnosis not present

## 2023-07-23 DIAGNOSIS — Z794 Long term (current) use of insulin: Secondary | ICD-10-CM | POA: Diagnosis not present

## 2023-07-23 DIAGNOSIS — M1990 Unspecified osteoarthritis, unspecified site: Secondary | ICD-10-CM | POA: Insufficient documentation

## 2023-07-24 NOTE — Progress Notes (Signed)
Elijah, [name] ([id number]) [id number] Page 1 of 8 Visit Report for 07/23/2023 Allergy List Details Patient Name: Date of Service: [name], [name] 07/23/2023 9:30 A M Medical Record Number: [id number] Patient Account Number: [id number] Date of Birth/Sex: Treating RN: Feb 01, 1951 (72 y.o. [name] Primary Care : [name] Other Clinician: Referring : Treating /Extender: [name] in Treatment: 0 Allergies Active Allergies furosemide olmesartan Diamox Sequels lisinopril Allergy Notes Electronic Signature(s) Signed: 07/24/2023 11:53:17 AM By: Thayer Dallas Entered By: Thayer Dallas on 07/23/2023 10:16:02 -------------------------------------------------------------------------------- Arrival Information Details Patient Name: Date of Service: [name]. 07/23/2023 9:30 A M Medical Record Number: [id number] Patient Account Number: [id number] Date of Birth/Sex: Treating RN: 12-07-1951 (72 y.o. M) Primary Care : [name] Other Clinician: Referring : Treating /Extender: [name] in Treatment: 0 Visit Information Patient Arrived: [name] Time: 10:05 Accompanied By: wife Transfer Assistance: None Patient Identification Verified: Yes Secondary Verification Process Completed: Yes Patient Requires Transmission-Based Precautions: No Patient Has Alerts: No Electronic Signature(s) Signed: 07/24/2023 11:53:17 AM By: Thayer Dallas Entered By: Thayer Dallas on 07/23/2023 10:09:45 [name] ([id number]) [id number] Page 2 of 8 -------------------------------------------------------------------------------- Clinic Level of Care Assessment Details Patient Name: Date of Service: [name], [name] 07/23/2023 9:30 A M Medical Record Number: [id number] Patient Account Number: [id number] Date of Birth/Sex: Treating  RN: 04/12/1951 (72 y.o. [name] Primary Care : [name] Other Clinician: Referring : Treating /Extender: [name] in Treatment: 0 Clinic Level of Care Assessment Items TOOL 1 Quantity Score X- 1 0 Use when EandM and Procedure is performed on INITIAL visit ASSESSMENTS - Nursing Assessment / Reassessment X- 1 20 General Physical Exam (combine w/ comprehensive assessment (listed just below) when performed on new pt. evals) X- 1 25 Comprehensive Assessment (HX, ROS, Risk Assessments, Wounds Hx, etc.) ASSESSMENTS - Wound and Skin Assessment / Reassessment X- 1 10 Dermatologic / Skin Assessment (not related to wound area) ASSESSMENTS - Ostomy and/or Continence Assessment and Care []  - 0 Incontinence Assessment and Management []  - 0 Ostomy Care Assessment and Management (repouching, etc.) PROCESS - Coordination of Care X - Simple Patient / Family Education for ongoing care 1 15 []  - 0 Complex (extensive) Patient / Family Education for ongoing care X- 1 10 Staff obtains Chiropractor, Records, T Results / Process Orders est []  - 0 Staff telephones HHA, Nursing Homes / Clarify orders / etc []  - 0 Routine Transfer to another Facility (non-emergent condition) []  - 0 Routine Hospital Admission (non-emergent condition) X- 1 15 New Admissions / Manufacturing engineer / Ordering NPWT Apligraf, etc. , []  - 0 Emergency Hospital Admission (emergent condition) PROCESS - Special Needs []  - 0 Pediatric / Minor Patient Management []  - 0 Isolation Patient Management []  - 0 Hearing / Language / Visual special needs []  - 0 Assessment of Community assistance (transportation, D/C planning, etc.) []  - 0 Additional assistance / Altered mentation []  - 0 Support Surface(s) Assessment (bed, cushion, seat, etc.) INTERVENTIONS - Miscellaneous []  - 0 External ear exam []  - 0 Patient Transfer (multiple staff / Nurse, adult / Similar  devices) []  - 0 Simple Staple / Suture removal (25 or less) []  - 0 Complex Staple / Suture removal (26 or more) []  - 0 Hypo/Hyperglycemic Management (do not check if billed separately) []  - 0 Ankle / Brachial Index (ABI) - do not check if billed separately Has the patient been seen at the hospital within the  last three years: Yes Total Score: 95 Level Of Care: New/Established - Level 3 Electronic Signature(s) Signed: 07/23/2023 5:47:40 PM By: Elijah Stall RN, BSN [name],Signed:B8/12/2022 5:47:40 PM By: Elijah Stall RN, BSN Trigg ([id number]) [id number] Page 3 of 8 Entered By: [name] on 07/23/2023 10:42:57 -------------------------------------------------------------------------------- Encounter Discharge Information Details Patient Name: Date of Service: [name], [name] 07/23/2023 9:30 A M Medical Record Number: [id number] Patient Account Number: [id number] Date of Birth/Sex: Treating RN: Aug 10, 1951 (72 y.o. [name] Primary Care : [name] Other Clinician: Referring : Treating /Extender: [name] in Treatment: 0 Encounter Discharge Information Items Post Procedure Vitals Discharge Condition: Stable Temperature (F): 99.1 Ambulatory Status: Cane Pulse (bpm): 65 Discharge Destination: Home Respiratory Rate (breaths/min): 20 Transportation: Private Auto Blood Pressure (mmHg): 174/79 Accompanied By: wife Schedule Follow-up Appointment: Yes Clinical Summary of Care: Electronic Signature(s) Signed: 07/23/2023 5:47:40 PM By: Elijah Stall RN, BSN Entered By: [name] on 07/23/2023 10:44:02 -------------------------------------------------------------------------------- Lower Extremity Assessment Details Patient Name: Date of Service: [name], Elijah Y [name]. 07/23/2023 9:30 A M Medical Record Number: [id number] Patient Account Number: [id number] Date of Birth/Sex: Treating RN: [birth year] (72  y.o. [name] Primary Care : [name] Other Clinician: Referring : Treating /Extender: [name] in Treatment: 0 Electronic Signature(s) Signed: 07/23/2023 5:47:40 PM By: Elijah Stall RN, BSN Entered By: [name] on 07/23/2023 10:25:34 -------------------------------------------------------------------------------- Multi Wound Chart Details Patient Name: Date of Service: [name], [name] 07/23/2023 9:30 A M Medical Record Number: [id number] Patient Account Number: [id number] Date of Birth/Sex: Treating RN: 01/08/1951 (72 y.o. M) Primary Care : [name] Other Clinician: Referring : Treating /Extender: [name] in Treatment: 0 Vital Signs Height(in): 70 Pulse(bpm): 65 Weight(lbs): 218 Blood Pressure(mmHg): 174/79 [name], [name] ([id number]) [phone].pdf Page 4 of 8 Body Mass Index(BMI): 31.3 Temperature(F): 99.1 Respiratory Rate(breaths/min): 18 [1:Photos:] [N/A:N/A] Left, Anterior Knee N/A N/A Wound Location: Skin T ear/Laceration N/A N/A Wounding Event: Skin T ear N/A N/A Primary Etiology: Glaucoma, Sleep Apnea, Congestive N/A N/A Comorbid History: Heart Failure, Coronary Artery Disease, Hypertension, Type II Diabetes, Osteoarthritis, Neuropathy 06/19/2023 N/A N/A Date Acquired: 0 N/A N/A Weeks of Treatment: Open N/A N/A Wound Status: No N/A N/A Wound Recurrence: 0.5x0.3x0.1 N/A N/A Measurements L x W x D (cm) 0.118 N/A N/A A (cm) : rea 0.012 N/A N/A Volume (cm) : Partial Thickness N/A N/A Classification: Small N/A N/A Exudate A mount: Serosanguineous N/A N/A Exudate Type: red, brown N/A N/A Exudate Color: Distinct, outline attached N/A N/A Wound Margin: N/A N/A N/A Necrotic A mount: Eschar N/A N/A Necrotic Tissue: Fascia: No N/A N/A Exposed Structures: Fat Layer (Subcutaneous Tissue): No Tendon:  No Muscle: No Joint: No Bone: No Large (67-100%) N/A N/A Epithelialization: Debridement - Selective/Open Wound N/A N/A Debridement: Pre-procedure Verification/Time Out 10:30 N/A N/A Taken: Lidocaine 4% Topical Solution N/A N/A Pain Control: Necrotic/Eschar, Slough N/A N/A Tissue Debrided: Skin/Epidermis N/A N/A Level: 0.12 N/A N/A Debridement A (sq cm): rea Curette N/A N/A Instrument: Minimum N/A N/A Bleeding: Pressure N/A N/A Hemostasis A chieved: 0 N/A N/A Procedural Pain: 0 N/A N/A Post Procedural Pain: Procedure was tolerated well N/A N/A Debridement Treatment Response: 0.5x0.3x0.1 N/A N/A Post Debridement Measurements L x W x D (cm) 0.012 N/A N/A Post Debridement Volume: (cm) Scarring: Yes N/A N/A Periwound Skin Texture: Excoriation: No Induration: No Callus: No Crepitus: No Rash: No Maceration: No N/A N/A Periwound Skin Moisture: Dry/Scaly: No Atrophie Blanche: No N/A N/A Periwound Skin  Color: Cyanosis: No Ecchymosis: No Erythema: No Hemosiderin Staining: No Mottled: No Pallor: No Rubor: No Debridement N/A N/A Procedures Performed: Treatment Notes Electronic Signature(s) Signed: 07/23/2023 4:31:19 PM By: Hubbard Hartshorn ([id number]) [id number] Page 5 of 8 Entered By: Geralyn Corwin on 07/23/2023 10:42:53 -------------------------------------------------------------------------------- Multi-Disciplinary Care Plan Details Patient Name: Date of Service: [name], [name] 07/23/2023 9:30 A M Medical Record Number: [id number] Patient Account Number: [id number] Date of Birth/Sex: Treating RN: 1951/08/28 (72 y.o. Harlon Flor, Yvonne Kendall Primary Care : [name] Other Clinician: Referring : Treating /Extender: [name] in Treatment: 0 Active Inactive Abuse / Safety / Falls / Self Care Management Nursing Diagnoses: Impaired physical  mobility Goals: Patient will remain injury free Date Initiated: 07/23/2023 Target Resolution Date: 08/28/2023 Goal Status: Active Patient/caregiver will verbalize understanding of skin care regimen Date Initiated: 07/23/2023 Target Resolution Date: 08/28/2023 Goal Status: Active Interventions: Assess fall risk on admission and as needed Provide education on basic hygiene Provide education on fall prevention Notes: Orientation to the Wound Care Program Nursing Diagnoses: Knowledge deficit related to the wound healing center program Goals: Patient/caregiver will verbalize understanding of the Wound Healing Center Program Date Initiated: 07/23/2023 Target Resolution Date: 08/28/2023 Goal Status: Active Interventions: Provide education on orientation to the wound center Notes: Electronic Signature(s) Signed: 07/23/2023 5:47:40 PM By: Elijah Stall RN, BSN Entered By: [name] on 07/23/2023 10:30:52 -------------------------------------------------------------------------------- Pain Assessment Details Patient Name: Date of Service: Elijah Gunther [name]. 07/23/2023 9:30 A M Medical Record Number: [id number] Patient Account Number: [id number] Date of Birth/Sex: Treating RN: 05/07/1951 (72 y.o. [name] Primary Care : [name] Other Clinician: Referring : Treating /Extender: Kelli Hope [place], [name] ([id number]) [id number] Page 6 of 8 Weeks in Treatment: 0 Active Problems Location of Pain Severity and Description of Pain Patient Has Paino No Site Locations Pain Management and Medication Current Pain Management: Electronic Signature(s) Signed: 07/23/2023 5:47:40 PM By: Elijah Stall RN, BSN Entered By: [name] on 07/23/2023 10:26:55 -------------------------------------------------------------------------------- Patient/Caregiver Education Details Patient Name: Date of Service: [name]  8/1/2024andnbsp9:30 A M Medical Record Number: [id number] Patient Account Number: [id number] Date of Birth/Gender: Treating RN: 03/26/1951 (72 y.o. [name] Primary Care Physician: [name] Other Clinician: Referring Physician: Treating Physician/Extender: [name] in Treatment: 0 Education Assessment Education Provided To: Patient Education Topics Provided Welcome T [facility]-New Patient Packet: o Handouts: Fall Prevention and Safe Transfers, Welcome T [facility] o Methods: Explain/Verbal, Printed Responses: Reinforcements needed Wound/Skin Impairment: Handouts: Caring for Your Ulcer Methods: Explain/Verbal, Printed Responses: Reinforcements needed Electronic Signature(s) Signed: 07/23/2023 5:47:40 PM By: Elijah Stall RN, BSN Entered By: [name] on 07/23/2023 10:32:59 [name], [name] ([id number]) [id number] Page 7 of 8 -------------------------------------------------------------------------------- Wound Assessment Details Patient Name: Date of Service: [name], [name] 07/23/2023 9:30 A M Medical Record Number: [id number] Patient Account Number: [id number] Date of Birth/Sex: Treating RN: [birth year] (72 y.o. Harlon Flor, Millard.Loa Primary Care : [name] Other Clinician: Referring : Treating /Extender: [name] in Treatment: 0 Wound Status Wound Number: 1 Primary Skin T ear Etiology: Wound Location: Left, Anterior Knee Wound Open Wounding Event: Skin Tear/Laceration Status: Date Acquired: 06/19/2023 Comorbid Glaucoma, Sleep Apnea, Congestive Heart Failure, Coronary Artery Weeks Of Treatment: 0 History: Disease, Hypertension, Type II Diabetes, Osteoarthritis, Clustered Wound: No Neuropathy Photos Wound Measurements Length: (cm) 0 Width: (cm) 0 Depth: (cm) 0 Area: (cm) Volume: (cm) .5 %  Reduction in Area: .3 % Reduction in  Volume: .1 Epithelialization: Large (67-100%) 0.118 Tunneling: No 0.012 Undermining: No Wound Description Classification: Partial Thickness Wound Margin: Distinct, outline attached Exudate Amount: Small Exudate Type: Serosanguineous Exudate Color: red, brown Foul Odor After Cleansing: No Slough/Fibrino Yes Wound Bed Necrotic Amount: Large (67-100%) Exposed Structure Necrotic Quality: Eschar Fascia Exposed: No Fat Layer (Subcutaneous Tissue) Exposed: No Tendon Exposed: No Muscle Exposed: No Joint Exposed: No Bone Exposed: No Periwound Skin Texture Texture Color No Abnormalities Noted: No No Abnormalities Noted: No Callus: No Atrophie Blanche: No Crepitus: No Cyanosis: No Excoriation: No Ecchymosis: No Induration: No Erythema: No Rash: No Hemosiderin Staining: No Scarring: Yes Mottled: No Pallor: No Moisture Rubor: No No Abnormalities Noted: No Dry / Scaly: No Maceration: No LUPE, BONNER [name] ([id number]) [phone].pdf Page 8 of 8 Treatment Notes Wound #1 (Knee) Wound Laterality: Left, Anterior Cleanser Soap and Water Discharge Instruction: May shower and wash wound with dial antibacterial soap and water prior to dressing change. Peri-Wound Care Topical Primary Dressing Xeroform Occlusive Gauze Dressing, 4x4 in Discharge Instruction: Apply to wound bed as instructed Secondary Dressing Zetuvit Plus Silicone Border Dressing 4x4 (in/in) Discharge Instruction: Apply silicone border over primary dressing or a bandaid. Secured With Compression Wrap Compression Stockings Facilities manager) Signed: 07/23/2023 5:47:40 PM By: Elijah Stall RN, BSN Signed: 07/24/2023 11:53:17 AM By: Thayer Dallas Entered By: Thayer Dallas on 07/23/2023 10:28:13 -------------------------------------------------------------------------------- Vitals Details Patient Name: Date of Service: [name], [name]. 07/23/2023 9:30 A M Medical Record Number:  [id number] Patient Account Number: [id number] Date of Birth/Sex: Treating RN: December 15, 1951 (72 y.o. M) Primary Care : [name] Other Clinician: Referring : Treating /Extender: [name] in Treatment: 0 Vital Signs Time Taken: 10:09 Temperature (F): 99.1 Height (in): 70 Pulse (bpm): 65 Source: Stated Respiratory Rate (breaths/min): 18 Weight (lbs): 218 Blood Pressure (mmHg): 174/79 Source: Stated Reference Range: 80 - 120 mg / dl Body Mass Index (BMI): 31.3 Electronic Signature(s) Signed: 07/24/2023 11:53:17 AM By: Thayer Dallas Entered By: Thayer Dallas on 07/23/2023 10:17:28

## 2023-07-24 NOTE — Progress Notes (Signed)
ANUP, BRIGHAM ([id number]) [id number] Page 1 of 9 Visit Report for 07/23/2023 Chief Complaint Document Details Patient Name: Date of Service: LENN, VOLKER 07/23/2023 9:30 A M Medical Record Number: [id number] Patient Account Number: [id number] Date of Birth/Sex: Treating RN: 12/10/1951 (72 y.o. M) Primary Care Provider: Sanda Linger Other Clinician: Referring Provider: Treating Provider/Extender: Marja Kays in Treatment: 0 Information Obtained from: Patient Chief Complaint 07/23/2023; left knee wound Electronic Signature(s) Signed: 07/23/2023 4:31:19 PM By: Geralyn Corwin DO Entered By: Geralyn Corwin on 07/23/2023 10:43:12 -------------------------------------------------------------------------------- Debridement Details Patient Name: Date of Service: [name]. 07/23/2023 9:30 A M Medical Record Number: [id number] Patient Account Number: [id number] Date of Birth/Sex: Treating RN: 1951-07-04 (72 y.o. Harlon Flor, Yvonne Kendall Primary Care Provider: Sanda Linger Other Clinician: Referring Provider: Treating Provider/Extender: Marja Kays in Treatment: 0 Debridement Performed for Assessment: Wound #1 Left,Anterior Knee Performed By: Physician Geralyn Corwin, DO Debridement Type: Debridement Level of Consciousness (Pre-procedure): Awake and Alert Pre-procedure Verification/Time Out Yes - 10:30 Taken: Start Time: 10:31 Pain Control: Lidocaine 4% T opical Solution Percent of Wound Bed Debrided: 100% T Area Debrided (cm): otal 0.12 Tissue and other material debrided: Viable, Non-Viable, Eschar, Slough, Skin: Dermis , Skin: Epidermis, Slough Level: Skin/Epidermis Debridement Description: Selective/Open Wound Instrument: Curette Bleeding: Minimum Hemostasis Achieved: Pressure End Time: 10:38 Procedural Pain: 0 Post Procedural Pain: 0 Response to Treatment: Procedure was tolerated well Level of  Consciousness (Post- Awake and Alert procedure): Post Debridement Measurements of Total Wound Length: (cm) 0.5 Width: (cm) 0.3 Depth: (cm) 0.1 Volume: (cm) 0.012 Character of Wound/Ulcer Post Debridement: Improved SEWELL, PITNER B ([id number]) [id number] Page 2 of 9 Post Procedure Diagnosis Same as Pre-procedure Electronic Signature(s) Signed: 07/23/2023 4:31:19 PM By: Geralyn Corwin DO Signed: 07/23/2023 5:47:40 PM By: Shawn Stall RN, BSN Entered By: Shawn Stall on 07/23/2023 10:39:30 -------------------------------------------------------------------------------- HPI Details Patient Name: Date of Service: Linus Orn Y B. 07/23/2023 9:30 A M Medical Record Number: [id number] Patient Account Number: [id number] Date of Birth/Sex: Treating RN: Jan 08, 1951 (72 y.o. M) Primary Care Provider: Sanda Linger Other Clinician: Referring Provider: Treating Provider/Extender: Marja Kays in Treatment: 0 History of Present Illness HPI Description: 07/23/2023 Mr. Audel Coakley is a 71 year old male with a past medical history of controlled insulin-dependent type 2 diabetes and OSA That presents to the clinic for a 1-2- month history of nonhealing wound to the left knee. He states that he fell creating a wound. He has been following with his primary care office for this issue. He has been using Vaseline to the area. He has not taken any oral antibiotics for this issue. He currently denies signs of infection. Compared to pictures from when this first started the wound is smaller. Electronic Signature(s) Signed: 07/23/2023 4:31:19 PM By: Geralyn Corwin DO Entered By: Geralyn Corwin on 07/23/2023 11:21:42 -------------------------------------------------------------------------------- Physical Exam Details Patient Name: Date of Service: AMARRI, SATTERLY 07/23/2023 9:30 A M Medical Record Number: [id number] Patient Account Number: [id number] Date of  Birth/Sex: Treating RN: 05-26-1951 (72 y.o. M) Primary Care Provider: Sanda Linger Other Clinician: Referring Provider: Treating Provider/Extender: Marja Kays in Treatment: 0 Constitutional respirations regular, non-labored and within target range for patient.. Cardiovascular 2+ dorsalis pedis/posterior tibialis pulses. Psychiatric pleasant and cooperative. Notes Open wound to the right knee with nonviable tissue. Postdebridement there is granulation tissue. No signs of infection including increased warmth, erythema or purulent drainage. Electronic Signature(s) Signed: 07/23/2023 4:31:19 PM By: Geralyn Corwin DO Entered By:  Geralyn Corwin on 07/23/2023 11:22:14 SABASTION, HRDLICKA ([id number]) [id number] Page 3 of 9 -------------------------------------------------------------------------------- Physician Orders Details Patient Name: Date of Service: OSEPH, IMBURGIA 07/23/2023 9:30 A M Medical Record Number: [id number] Patient Account Number: [id number] Date of Birth/Sex: Treating RN: 01-Nov-1951 (72 y.o. Tammy Sours Primary Care Provider: Sanda Linger Other Clinician: Referring Provider: Treating Provider/Extender: Marja Kays in Treatment: 0 Verbal / Phone Orders: No Diagnosis Coding ICD-10 Coding Code Description [id number] Non-pressure chronic ulcer of other part of left lower leg limited to breakdown of skin Follow-up Appointments ppointment in 1 week. - Dr. Mikey Bussing 0845pm Thursday 07/30/2023 room 8 Return A Anesthetic (In clinic) Topical Lidocaine 4% applied to wound bed Bathing/ Shower/ Hygiene May shower and wash wound with soap and water. Edema Control - Lymphedema / SCD / Other Avoid standing for long periods of time. Additional Orders / Instructions Other: - diabetic shoes or Geri sleeves to feet and legs protection of skin when skin tears. Wound Treatment Wound #1 - Knee Wound  Laterality: Left, Anterior Cleanser: Soap and Water 1 x Per Day/30 Days Discharge Instructions: May shower and wash wound with dial antibacterial soap and water prior to dressing change. Prim Dressing: Xeroform Occlusive Gauze Dressing, 4x4 in 1 x Per Day/30 Days ary Discharge Instructions: Apply to wound bed as instructed Secondary Dressing: Zetuvit Plus Silicone Border Dressing 4x4 (in/in) 1 x Per Day/30 Days Discharge Instructions: Apply silicone border over primary dressing or a bandaid. Patient Medications llergies: furosemide, olmesartan, Diamox Sequels, lisinopril A Notifications Medication Indication Start End applied in clinic for any 07/23/2023 lidocaine debridement in clinic. DOSE topical 4 % cream - cream topical once daily Electronic Signature(s) Signed: 07/23/2023 4:31:19 PM By: Geralyn Corwin DO Entered By: Geralyn Corwin on 07/23/2023 11:22:22 Problem List Details -------------------------------------------------------------------------------- [name] ([id number]) [id number] Page 4 of 9 Patient Name: Date of Service: BUZZ, AXEL 07/23/2023 9:30 A M Medical Record Number: [id number] Patient Account Number: [id number] Date of Birth/Sex: Treating RN: 12/25/1950 (72 y.o. M) Primary Care Provider: Sanda Linger Other Clinician: Referring Provider: Treating Provider/Extender: Marja Kays in Treatment: 0 Active Problems ICD-10 Encounter Code Description Active Date MDM Diagnosis [id number] Non-pressure chronic ulcer of other part of left lower leg limited to breakdown 07/23/2023 No Yes of skin T79.8XXA Other early complications of trauma, initial encounter 07/23/2023 No Yes E11.622 Type 2 diabetes mellitus with other skin ulcer 07/23/2023 No Yes Inactive Problems Resolved Problems Electronic Signature(s) Signed: 07/23/2023 4:31:19 PM By: Geralyn Corwin DO Entered By: Geralyn Corwin on 07/23/2023  10:42:45 -------------------------------------------------------------------------------- Progress Note Details Patient Name: Date of Service: Linus Orn Y B. 07/23/2023 9:30 A M Medical Record Number: [id number] Patient Account Number: [id number] Date of Birth/Sex: Treating RN: 20-Jan-1951 (72 y.o. M) Primary Care Provider: Sanda Linger Other Clinician: Referring Provider: Treating Provider/Extender: Marja Kays in Treatment: 0 Subjective Chief Complaint Information obtained from Patient 07/23/2023; left knee wound History of Present Illness (HPI) 07/23/2023 Mr. Rodgerick Gilliand is a 72 year old male with a past medical history of controlled insulin-dependent type 2 diabetes and OSA That presents to the clinic for a 1-2- month history of nonhealing wound to the left knee. He states that he fell creating a wound. He has been following with his primary care office for this issue. He has been using Vaseline to the area. He has not taken any oral antibiotics for this issue. He currently denies signs of infection. Compared to pictures from when this first started  the wound is smaller. Patient History Allergies furosemide, olmesartan, Diamox Sequels, lisinopril Family History Cancer - Mother,Siblings, Diabetes - Mother,Siblings, No family history of Heart Disease, Hereditary Spherocytosis, Hypertension, Kidney Disease, Lung Disease, Seizures, Stroke, Thyroid Problems, Tuberculosis. Social History Never smoker, Marital Status - Married, Alcohol Use - Never, Drug Use - No History, Caffeine Use - Daily. AIVAN, FILLINGIM ([id number]) [id number] Page 5 of 9 Medical History Eyes Patient has history of Cataracts - removed, Glaucoma Ear/Nose/Mouth/Throat Patient has history of Middle ear problems Denies history of Chronic sinus problems/congestion Hematologic/Lymphatic Patient has history of Anemia Denies history of Hemophilia, Human Immunodeficiency  Virus, Lymphedema, Sickle Cell Disease Respiratory Patient has history of Sleep Apnea Denies history of Aspiration, Asthma, Chronic Obstructive Pulmonary Disease (COPD), Pneumothorax, Tuberculosis Cardiovascular Patient has history of Congestive Heart Failure, Coronary Artery Disease, Hypertension Denies history of Angina, Arrhythmia, Deep Vein Thrombosis, Hypotension, Myocardial Infarction, Peripheral Arterial Disease, Peripheral Venous Disease, Phlebitis, Vasculitis Gastrointestinal Denies history of Cirrhosis , Colitis, Crohns, Hepatitis A, Hepatitis B, Hepatitis C Endocrine Patient has history of Type II Diabetes Denies history of Type I Diabetes Genitourinary Denies history of End Stage Renal Disease Immunological Denies history of Lupus Erythematosus, Raynauds, Scleroderma Integumentary (Skin) Denies history of History of Burn Musculoskeletal Patient has history of Osteoarthritis Denies history of Gout, Rheumatoid Arthritis, Osteomyelitis Neurologic Patient has history of Neuropathy Denies history of Dementia, Quadriplegia, Paraplegia, Seizure Disorder Oncologic Denies history of Received Chemotherapy, Received Radiation Psychiatric Denies history of Anorexia/bulimia, Confinement Anxiety Patient is treated with Insulin. Blood sugar is not tested. Hospitalization/Surgery History - 2016 lumbar laminectomy/ decompression microdiscectomy. - cholecystectomy. - neck surgery. - lumbar fusion. - peptic ulcer disease. - stents x 2. Medical A Surgical History Notes nd Eyes legally blind Ear/Nose/Mouth/Throat ringing in ears Review of Systems (ROS) Eyes Denies complaints or symptoms of Dry Eyes, Vision Changes, Glasses / Contacts. Ear/Nose/Mouth/Throat Denies complaints or symptoms of Chronic sinus problems or rhinitis. Respiratory Denies complaints or symptoms of Chronic or frequent coughs, Shortness of Breath. Gastrointestinal Denies complaints or symptoms of Frequent  diarrhea, Nausea, Vomiting. Endocrine Denies complaints or symptoms of Heat/cold intolerance. Genitourinary Denies complaints or symptoms of Frequent urination. Integumentary (Skin) left knee Psychiatric Denies complaints or symptoms of Claustrophobia. Objective Constitutional respirations regular, non-labored and within target range for patient.. Vitals Time Taken: 10:09 AM, Height: 70 in, Source: Stated, Weight: 218 lbs, Source: Stated, BMI: 31.3, Temperature: 99.1 F, Pulse: 65 bpm, Respiratory Rate: 18 breaths/min, Blood Pressure: 174/79 mmHg. Cardiovascular 2+ dorsalis pedis/posterior tibialis pulses. Psychiatric pleasant and cooperative. KAIVON, LIVESEY ([id number]) [id number] Page 6 of 9 General Notes: Open wound to the right knee with nonviable tissue. Postdebridement there is granulation tissue. No signs of infection including increased warmth, erythema or purulent drainage. Integumentary (Hair, Skin) Wound #1 status is Open. Original cause of wound was Skin T ear/Laceration. The date acquired was: 06/19/2023. The wound is located on the Left,Anterior Knee. The wound measures 0.5cm length x 0.3cm width x 0.1cm depth; 0.118cm^2 area and 0.012cm^3 volume. There is no tunneling or undermining noted. There is a small amount of serosanguineous drainage noted. The wound margin is distinct with the outline attached to the wound base. There is a large (67-100%) amount of necrotic tissue within the wound bed including Eschar. The periwound skin appearance exhibited: Scarring. The periwound skin appearance did not exhibit: Callus, Crepitus, Excoriation, Induration, Rash, Dry/Scaly, Maceration, Atrophie Blanche, Cyanosis, Ecchymosis, Hemosiderin Staining, Mottled, Pallor, Rubor, Erythema. Assessment Active Problems ICD-10 Non-pressure chronic ulcer of other part of left  lower leg limited to breakdown of skin Other early complications of trauma, initial  encounter Type 2 diabetes mellitus with other skin ulcer Patient presents with a wound to the knee secondary to trauma and nonhealing due to type 2 diabetes. I debrided nonviable tissue. No signs of infection. I recommend at this time Xeroform daily. Follow-up in 1 week. Procedures Wound #1 Pre-procedure diagnosis of Wound #1 is a Skin T located on the Left,Anterior Knee . There was a Selective/Open Wound Skin/Epidermis Debridement with a ear total area of 0.12 sq cm performed by Geralyn Corwin, DO. With the following instrument(s): Curette to remove Viable and Non-Viable tissue/material. Material removed includes Eschar, Slough, Skin: Dermis, and Skin: Epidermis after achieving pain control using Lidocaine 4% T opical Solution. A time out was conducted at 10:30, prior to the start of the procedure. A Minimum amount of bleeding was controlled with Pressure. The procedure was tolerated well with a pain level of 0 throughout and a pain level of 0 following the procedure. Post Debridement Measurements: 0.5cm length x 0.3cm width x 0.1cm depth; 0.012cm^3 volume. Character of Wound/Ulcer Post Debridement is improved. Post procedure Diagnosis Wound #1: Same as Pre-Procedure Plan Follow-up Appointments: Return Appointment in 1 week. - Dr. Mikey Bussing 0845pm Thursday 07/30/2023 room 8 Anesthetic: (In clinic) Topical Lidocaine 4% applied to wound bed Bathing/ Shower/ Hygiene: May shower and wash wound with soap and water. Edema Control - Lymphedema / SCD / Other: Avoid standing for long periods of time. Additional Orders / Instructions: Other: - diabetic shoes or Geri sleeves to feet and legs protection of skin when skin tears. The following medication(s) was prescribed: lidocaine topical 4 % cream cream topical once daily for applied in clinic for any debridement in clinic. was prescribed at facility WOUND #1: - Knee Wound Laterality: Left, Anterior Cleanser: Soap and Water 1 x Per Day/30  Days Discharge Instructions: May shower and wash wound with dial antibacterial soap and water prior to dressing change. Prim Dressing: Xeroform Occlusive Gauze Dressing, 4x4 in 1 x Per Day/30 Days ary Discharge Instructions: Apply to wound bed as instructed Secondary Dressing: Zetuvit Plus Silicone Border Dressing 4x4 (in/in) 1 x Per Day/30 Days Discharge Instructions: Apply silicone border over primary dressing or a bandaid. 1. In office sharp debridement 2. Xeroform 3. Follow-up in 1 week Electronic Signature(s) Signed: 07/23/2023 4:31:19 PM By: Geralyn Corwin DO Entered By: Geralyn Corwin on 07/23/2023 11:23:25 [name] ([id number]) [id number] Page 7 of 9 -------------------------------------------------------------------------------- HxROS Details Patient Name: Date of Service: SHLOME, BALDREE 07/23/2023 9:30 A M Medical Record Number: [id number] Patient Account Number: [id number] Date of Birth/Sex: Treating RN: 03-13-1951 (72 y.o. Tammy Sours Primary Care Provider: Sanda Linger Other Clinician: Referring Provider: Treating Provider/Extender: Marja Kays in Treatment: 0 Eyes Complaints and Symptoms: Negative for: Dry Eyes; Vision Changes; Glasses / Contacts Medical History: Positive for: Cataracts - removed; Glaucoma Past Medical History Notes: legally blind Ear/Nose/Mouth/Throat Complaints and Symptoms: Negative for: Chronic sinus problems or rhinitis Medical History: Positive for: Middle ear problems Negative for: Chronic sinus problems/congestion Past Medical History Notes: ringing in ears Respiratory Complaints and Symptoms: Negative for: Chronic or frequent coughs; Shortness of Breath Medical History: Positive for: Sleep Apnea Negative for: Aspiration; Asthma; Chronic Obstructive Pulmonary Disease (COPD); Pneumothorax; Tuberculosis Gastrointestinal Complaints and Symptoms: Negative for: Frequent  diarrhea; Nausea; Vomiting Medical History: Negative for: Cirrhosis ; Colitis; Crohns; Hepatitis A; Hepatitis B; Hepatitis C Endocrine Complaints and Symptoms: Negative for: Heat/cold intolerance Medical History: Positive  for: Type II Diabetes Negative for: Type I Diabetes Time with diabetes: 25 year + Treated with: Insulin Blood sugar tested every day: No Genitourinary Complaints and Symptoms: Negative for: Frequent urination Medical History: Negative for: End Stage Renal Disease Psychiatric Complaints and Symptoms: Negative for: Claustrophobia Medical History: Negative for: TERESO, UNANGST B ([id number]) [id number] Page 8 of 9 Hematologic/Lymphatic Medical History: Positive for: Anemia Negative for: Hemophilia; Human Immunodeficiency Virus; Lymphedema; Sickle Cell Disease Cardiovascular Medical History: Positive for: Congestive Heart Failure; Coronary Artery Disease; Hypertension Negative for: Angina; Arrhythmia; Deep Vein Thrombosis; Hypotension; Myocardial Infarction; Peripheral Arterial Disease; Peripheral Venous Disease; Phlebitis; Vasculitis Immunological Medical History: Negative for: Lupus Erythematosus; Raynauds; Scleroderma Integumentary (Skin) Complaints and Symptoms: Review of System Notes: left knee Medical History: Negative for: History of Burn Musculoskeletal Medical History: Positive for: Osteoarthritis Negative for: Gout; Rheumatoid Arthritis; Osteomyelitis Neurologic Medical History: Positive for: Neuropathy Negative for: Dementia; Quadriplegia; Paraplegia; Seizure Disorder Oncologic Medical History: Negative for: Received Chemotherapy; Received Radiation HBO Extended History Items Eyes: Eyes: Ear/Nose/Mouth/Throat: Cataracts Glaucoma Middle ear problems Immunizations Pneumococcal Vaccine: Received Pneumococcal Vaccination: Yes Received Pneumococcal Vaccination On or After 60th  Birthday: Yes Implantable Devices No devices added Hospitalization / Surgery History Type of Hospitalization/Surgery 2016 lumbar laminectomy/ decompression microdiscectomy cholecystectomy neck surgery lumbar fusion peptic ulcer disease stents x 2 Family and Social History Cancer: Yes - Mother,Siblings; Diabetes: Yes - Mother,Siblings; Heart Disease: No; Hereditary Spherocytosis: No; Hypertension: No; Kidney Disease: No; Lung Disease: No; Seizures: No; Stroke: No; Thyroid Problems: No; Tuberculosis: No; Never smoker; Marital Status - Married; Alcohol Use: Never; Drug Use: No History; Caffeine Use: Daily; Financial Concerns: No; Food, Clothing or Shelter Needs: No; Support System Lacking: No; Transportation Concerns: No Electronic Signature(s) Signed: 07/23/2023 4:31:19 PM By: Geralyn Corwin DO Signed: 07/23/2023 5:47:40 PM By: Shawn Stall RN, BSN Signed: 07/24/2023 11:53:17 AM By: Thayer Dallas Entered By: Thayer Dallas on 07/23/2023 10:32:52 ABID, BOLLA B ([id number]) [id number] Page 9 of 9 -------------------------------------------------------------------------------- SuperBill Details Patient Name: Date of Service: JAYLEN, KNOPE 07/23/2023 Medical Record Number: [id number] Patient Account Number: [id number] Date of Birth/Sex: Treating RN: 09-21-1951 (72 y.o. Tammy Sours Primary Care Provider: Sanda Linger Other Clinician: Referring Provider: Treating Provider/Extender: Marja Kays in Treatment: 0 Diagnosis Coding ICD-10 Codes Code Description [phone] Non-pressure chronic ulcer of other part of left lower leg limited to breakdown of skin T79.8XXA Other early complications of trauma, initial encounter E11.622 Type 2 diabetes mellitus with other skin ulcer Facility Procedures : CPT4 Code: [id number] Description: 99213 - WOUND CARE VISIT-LEV 3 EST PT Modifier: Quantity: 1 : CPT4 Code: [id number] Description: 97597 -  DEBRIDE WOUND 1ST 20 SQ CM OR < ICD-10 Diagnosis Description L97.821 Non-pressure chronic ulcer of other part of left lower leg limited to breakdown o T79.8XXA Other early complications of trauma, initial encounter E11.622 Type 2  diabetes mellitus with other skin ulcer Modifier: f skin Quantity: 1 Physician Procedures : CPT4 Code Description Modifier [id number] WC PHYS LEVEL 3 NEW PT 25 ICD-10 Diagnosis Description L97.821 Non-pressure chronic ulcer of other part of left lower leg limited to breakdown of skin T79.8XXA Other early complications of trauma, initial  encounter E11.622 Type 2 diabetes mellitus with other skin ulcer Quantity: 1 : [id number] 97597 - WC PHYS DEBR WO ANESTH 20 SQ CM ICD-10 Diagnosis Description L97.821 Non-pressure chronic ulcer of other part of left lower leg limited to breakdown of skin T79.8XXA Other early complications of trauma, initial encounter E11.622 Type 2  diabetes mellitus  with other skin ulcer Quantity: 1 Electronic Signature(s) Signed: 07/23/2023 4:31:19 PM By: Geralyn Corwin DO Entered By: Geralyn Corwin on 07/23/2023 11:23:39

## 2023-07-24 NOTE — Progress Notes (Signed)
SCORPIO, FORTIN ([id number]) [id number] Page 1 of 4 Visit Report for 07/23/2023 Abuse Risk Screen Details Patient Name: Date of Service: [name], [name] 07/23/2023 9:30 A M Medical Record Number: [id number] Patient Account Number: [id number] Date of Birth/Sex: Treating RN: Jul 30, 1951 (72 y.o. M) Primary Care : Sanda Linger Other Clinician: Referring : Treating /Extender: Marja Kays in Treatment: 0 Abuse Risk Screen Items Answer ABUSE RISK SCREEN: Has anyone close to you tried to hurt or harm you recentlyo No Do you feel uncomfortable with anyone in your familyo No Has anyone forced you do things that you didnt want to doo No Electronic Signature(s) Signed: 07/24/2023 11:53:17 AM By: Thayer Dallas Entered By: Thayer Dallas on 07/23/2023 10:17:41 -------------------------------------------------------------------------------- Activities of Daily Living Details Patient Name: Date of Service: [name], [name] 07/23/2023 9:30 A M Medical Record Number: [id number] Patient Account Number: [id number] Date of Birth/Sex: Treating RN: 01/18/1951 (72 y.o. M) Primary Care : Sanda Linger Other Clinician: Referring : Treating /Extender: Marja Kays in Treatment: 0 Activities of Daily Living Items Answer Activities of Daily Living (Please select one for each item) Drive Automobile Not Able T Medications ake Need Assistance Use T elephone Need Assistance Care for Appearance Need Assistance Use T oilet Need Assistance Bath / Shower Need Assistance Dress Self Need Assistance Feed Self Completely Able Walk Need Assistance Get In / Out Bed Need Assistance Housework Not Able Prepare Meals Not Able Handle Money Not Able Shop for Self Not Able Electronic Signature(s) Signed: 07/24/2023 11:53:17 AM By: Thayer Dallas Entered By: Thayer Dallas on 07/23/2023  10:18:48 [name] ([id number]) [id number] Page 2 of 4 -------------------------------------------------------------------------------- Education Screening Details Patient Name: Date of Service: [name], [name] 07/23/2023 9:30 A M Medical Record Number: [id number] Patient Account Number: [id number] Date of Birth/Sex: Treating RN: 02-09-1951 (72 y.o. M) Primary Care : Sanda Linger Other Clinician: Referring : Treating /Extender: Marja Kays in Treatment: 0 Primary Learner Assessed: Patient Learning Preferences/Education Level/Primary Language Learning Preference: Explanation, Demonstration, Printed Material Highest Education Level: High School Preferred Language: English Cognitive Barrier Language Barrier: No Translator Needed: No Memory Deficit: No Emotional Barrier: No Cultural/Religious Beliefs Affecting Medical Care: No Physical Barrier Impaired Vision: Yes Legally Blind Knowledge/Comprehension Knowledge Level: High Comprehension Level: High Ability to understand written instructions: High Ability to understand verbal instructions: High Motivation Anxiety Level: Calm Cooperation: Cooperative Education Importance: Acknowledges Need Interest in Health Problems: Asks Questions Perception: Coherent Willingness to Engage in Self-Management High Activities: Readiness to Engage in Self-Management High Activities: Electronic Signature(s) Signed: 07/24/2023 11:53:17 AM By: Thayer Dallas Entered By: Thayer Dallas on 07/23/2023 10:33:45 -------------------------------------------------------------------------------- Fall Risk Assessment Details Patient Name: Date of Service: [name], Elijah Y [name]. 07/23/2023 9:30 A M Medical Record Number: [id number] Patient Account Number: [id number] Date of Birth/Sex: Treating RN: Dec 10, 1951 (72 y.o. M) Primary Care : Sanda Linger Other  Clinician: Referring : Treating /Extender: Marja Kays in Treatment: 0 Fall Risk Assessment Items Have you had 2 or more falls in the last 12 monthso 0 Yes Have you had any fall that resulted in injury in the last 12 monthso 0 Yes FALLS RISK SCREEN [name], [name] ([id number]) [id number] Page 3 of 4 History of falling - immediate or within 3 months 25 Yes Secondary diagnosis (Do you have 2 or more medical diagnoseso) 15 Yes Ambulatory aid None/bed rest/wheelchair/nurse 0 No Crutches/cane/walker 15 Yes Furniture 0 No Intravenous therapy Access/Saline/Heparin Lock 0 No  Gait/Transferring Normal/ bed rest/ wheelchair 0 No Weak (short steps with or without shuffle, stooped but able to lift head while walking, may seek 0 No support from furniture) Impaired (short steps with shuffle, may have difficulty arising from chair, head down, impaired 20 Yes balance) Mental Status Oriented to own ability 0 Yes Electronic Signature(s) Signed: 07/24/2023 11:53:17 AM By: Thayer Dallas Entered By: Thayer Dallas on 07/23/2023 10:20:19 -------------------------------------------------------------------------------- Foot Assessment Details Patient Name: Date of Service: Elijah Gunther [name]. 07/23/2023 9:30 A M Medical Record Number: [id number] Patient Account Number: [id number] Date of Birth/Sex: Treating RN: 1951-07-12 (72 y.o. Tammy Sours Primary Care : Sanda Linger Other Clinician: Referring : Treating /Extender: Marja Kays in Treatment: 0 Foot Assessment Items Site Locations + = Sensation present, - = Sensation absent, C = Callus, U = Ulcer R = Redness, W = Warmth, M = Maceration, PU = Pre-ulcerative lesion F = Fissure, S = Swelling, D = Dryness Assessment Right: Left: Other Deformity: No No Prior Foot Ulcer: No No Prior Amputation: No No Charcot Joint: No  No Ambulatory Status: GaitHODARI, [name] ([id number]) [id number] Page 4 of 4 Notes No BLE wounds. Electronic Signature(s) Signed: 07/23/2023 5:47:40 PM By: Shawn Stall RN, BSN Entered By: Shawn Stall on 07/23/2023 10:25:52 -------------------------------------------------------------------------------- Nutrition Risk Screening Details Patient Name: Date of Service: [name], [name] 07/23/2023 9:30 A M Medical Record Number: [id number] Patient Account Number: [id number] Date of Birth/Sex: Treating RN: 06-May-1951 (72 y.o. Tammy Sours Primary Care : Sanda Linger Other Clinician: Referring : Treating /Extender: Marja Kays in Treatment: 0 Height (in): Weight (lbs): Body Mass Index (BMI): Nutrition Risk Screening Items Score Screening NUTRITION RISK SCREEN: I have an illness or condition that made me change the kind and/or amount of food I eat 2 Yes I eat fewer than two meals per day 0 No I eat few fruits and vegetables, or milk products 0 No I have three or more drinks of beer, liquor or wine almost every day 0 No I have tooth or mouth problems that make it hard for me to eat 0 No I don't always have enough money to buy the food I need 0 No I eat alone most of the time 0 No I take three or more different prescribed or over-the-counter drugs a day 1 Yes Without wanting to, I have lost or gained 10 pounds in the last six months 0 No I am not always physically able to shop, cook and/or feed myself 2 Yes Nutrition Protocols Good Risk Protocol Provide education on elevated blood Moderate Risk Protocol 0 sugars and impact on wound healing, as applicable High Risk Proctocol Risk Level: Moderate Risk Score: 5 Electronic Signature(s) Signed: 07/23/2023 5:47:40 PM By: Shawn Stall RN, BSN Entered By: Shawn Stall on 07/22/2023 16:43:46

## 2023-07-27 ENCOUNTER — Ambulatory Visit (INDEPENDENT_AMBULATORY_CARE_PROVIDER_SITE_OTHER): Payer: Medicare PPO | Admitting: Podiatry

## 2023-07-27 ENCOUNTER — Encounter: Payer: Self-pay | Admitting: Podiatry

## 2023-07-27 VITALS — BP 173/75 | HR 61 | Ht 71.0 in | Wt 221.0 lb

## 2023-07-27 DIAGNOSIS — E1142 Type 2 diabetes mellitus with diabetic polyneuropathy: Secondary | ICD-10-CM | POA: Diagnosis not present

## 2023-07-27 DIAGNOSIS — B351 Tinea unguium: Secondary | ICD-10-CM

## 2023-07-27 DIAGNOSIS — M79609 Pain in unspecified limb: Secondary | ICD-10-CM

## 2023-07-27 NOTE — Progress Notes (Signed)
  Subjective:  Patient ID: [name], male    DOB: 1951-07-04,   MRN: [id number]  Chief Complaint  Patient presents with   Nail Problem    Improving foot cream is helping wants toenails trimmed today     72 y.o. male presents for concern of thickened elongated and painful nails that are difficult to trim. Requesting to have them trimmed today. Relates burning and tingling in their feet. Patient is diabetic and last A1c was  Lab Results  Component Value Date   HGBA1C 6.9 (H) 06/23/2023   .   PCP:  Etta Grandchild, MD    . Denies any other pedal complaints. Denies n/v/f/c.   Past Medical History:  Diagnosis Date   Arthritis    "mild arthritis in hip"   CAD (coronary artery disease)    CHF (congestive heart failure) (HCC)    Complication of anesthesia    "lungs filled up with fluid"    Diabetes mellitus    Type II   Glaucoma    Heart murmur    HTN (hypertension)    Hypercholesterolemia    LBP (low back pain)    Legally blind    Peripheral neuropathy    Sleep apnea    c-pap nightly   Staph infection    "from back surgery"   Status post insertion of drug eluting coronary artery stent    Trigger finger    Ulcer    Wears glasses    "to protect cornea" - legally blind    Objective:  Physical Exam: Vascular: DP/PT pulses 2/4 bilateral. CFT <3 seconds. Absent hair growth on digits. Edema noted to bilateral lower extremities. Xerosis noted bilaterally.  Skin. No lacerations or abrasions bilateral feet. Nails 1-5 bilateral  are thickened discolored and elongated with subungual debris.  Musculoskeletal: MMT 5/5 bilateral lower extremities in DF, PF, Inversion and Eversion. Deceased ROM in DF of ankle joint.  Neurological: Sensation intact to light touch. Protective sensation diminished bilateral.    Assessment:   1. Pain due to onychomycosis of nail   2. Diabetic peripheral neuropathy associated with type 2 diabetes mellitus (HCC)      Plan:  Patient was evaluated  and treated and all questions answered. -Discussed and educated patient on diabetic foot care, especially with  regards to the vascular, neurological and musculoskeletal systems.  -Stressed the importance of good glycemic control and the detriment of not  controlling glucose levels in relation to the foot. -Discussed supportive shoes at all times and checking feet regularly.  -Mechanically debrided all nails 1-5 bilateral using sterile nail nipper and filed with dremel without incident  -Answered all patient questions -Patient to return  in 3 months for at risk foot care -Patient advised to call the office if any problems or questions arise in the meantime.   Louann Sjogren, DPM

## 2023-07-30 ENCOUNTER — Ambulatory Visit (HOSPITAL_BASED_OUTPATIENT_CLINIC_OR_DEPARTMENT_OTHER): Payer: Medicare PPO | Admitting: Internal Medicine

## 2023-08-06 ENCOUNTER — Other Ambulatory Visit: Payer: Self-pay | Admitting: Internal Medicine

## 2023-08-06 DIAGNOSIS — J301 Allergic rhinitis due to pollen: Secondary | ICD-10-CM

## 2023-08-17 ENCOUNTER — Encounter (HOSPITAL_BASED_OUTPATIENT_CLINIC_OR_DEPARTMENT_OTHER): Payer: Medicare PPO | Admitting: Internal Medicine

## 2023-08-17 DIAGNOSIS — G4733 Obstructive sleep apnea (adult) (pediatric): Secondary | ICD-10-CM | POA: Diagnosis not present

## 2023-08-17 DIAGNOSIS — I110 Hypertensive heart disease with heart failure: Secondary | ICD-10-CM | POA: Diagnosis not present

## 2023-08-17 DIAGNOSIS — Z794 Long term (current) use of insulin: Secondary | ICD-10-CM | POA: Diagnosis not present

## 2023-08-17 DIAGNOSIS — I2510 Atherosclerotic heart disease of native coronary artery without angina pectoris: Secondary | ICD-10-CM | POA: Diagnosis not present

## 2023-08-17 DIAGNOSIS — L97821 Non-pressure chronic ulcer of other part of left lower leg limited to breakdown of skin: Secondary | ICD-10-CM | POA: Diagnosis not present

## 2023-08-17 DIAGNOSIS — M1990 Unspecified osteoarthritis, unspecified site: Secondary | ICD-10-CM | POA: Diagnosis not present

## 2023-08-17 DIAGNOSIS — I509 Heart failure, unspecified: Secondary | ICD-10-CM | POA: Diagnosis not present

## 2023-08-17 DIAGNOSIS — E1140 Type 2 diabetes mellitus with diabetic neuropathy, unspecified: Secondary | ICD-10-CM | POA: Diagnosis not present

## 2023-08-17 DIAGNOSIS — E11622 Type 2 diabetes mellitus with other skin ulcer: Secondary | ICD-10-CM | POA: Diagnosis not present

## 2023-08-19 NOTE — Progress Notes (Signed)
[name], [name] ([id number]) [id number] Page 1 of 7 Visit Report for 08/17/2023 Arrival Information Details Patient Name: Date of Service: [name], [name] 08/17/2023 1:15 PM Medical Record Number: [id number] Patient Account Number: [id number] Date of Birth/Sex: Treating RN: [name] (72 y.o. M) Primary Care [name]: [name] Other Clinician: Referring [name]: Treating [name]/Extender: [name] in Treatment: 3 Visit Information History Since Last Visit All ordered tests and consults were completed: No Patient Arrived: Ambulatory Added or deleted any medications: No Arrival Time: 13:27 Any new allergies or adverse reactions: No Accompanied By: wife Had a fall or experienced change in No Transfer Assistance: Manual activities of daily living that may affect Patient Identification Verified: Yes risk of falls: Secondary Verification Process Completed: Yes Signs or symptoms of abuse/neglect since last visito No Patient Requires Transmission-Based Precautions: No Hospitalized since last visit: No Patient Has Alerts: No Implantable device outside of the clinic excluding No cellular tissue based products placed in the center since last visit: Pain Present Now: No Electronic Signature(s) Signed: 08/17/2023 1:56:44 PM By: [name] Entered By: [name] on 08/17/2023 13:28:14 -------------------------------------------------------------------------------- Encounter Discharge Information Details Patient Name: Date of Service: [name], [name] 08/17/2023 1:15 PM Medical Record Number: [id number] Patient Account Number: [id number] Date of Birth/Sex: Treating RN: [name] (72 y.o. [name] Primary Care Bridgett Hattabaugh: [name] Other Clinician: Referring Nuh Lipton: Treating Armas Mcbee/Extender: [name] in Treatment: 3 Encounter Discharge Information Items Post Procedure Vitals Discharge  Condition: Stable Temperature (F): 98.1 Ambulatory Status: Cane Pulse (bpm): 68 Discharge Destination: Home Respiratory Rate (breaths/min): 18 Transportation: Private Auto Blood Pressure (mmHg): 188/86 Accompanied By: wife Schedule Follow-up Appointment: Yes Clinical Summary of Care: Electronic Signature(s) Signed: 08/18/2023 5:33:37 PM By: Elijah Stall RN, BSN Entered By: [name] on 08/17/2023 13:47:16 [name], [name] ([id number]) [id number] Page 2 of 7 -------------------------------------------------------------------------------- Lower Extremity Assessment Details Patient Name: Date of Service: [name], [name] 08/17/2023 1:15 PM Medical Record Number: [id number] Patient Account Number: [id number] Date of Birth/Sex: Treating RN: [name] (72 y.o. [name] Primary Care Juell Radney: [name] Other Clinician: Referring [name]: Treating [name]/Extender: [name] in Treatment: 3 Electronic Signature(s) Signed: 08/18/2023 5:33:37 PM By: Elijah Stall RN, BSN Entered By: [name] on 08/17/2023 13:41:49 -------------------------------------------------------------------------------- Multi Wound Chart Details Patient Name: Date of Service: [name], [name] 08/17/2023 1:15 PM Medical Record Number: [id number] Patient Account Number: [id number] Date of Birth/Sex: Treating RN: [name] (71 y.o. M) Primary Care [name]: [name] Other Clinician: Referring [name]: Treating [name]/Extender: [name] in Treatment: 3 Vital Signs Height(in): 70 Pulse(bpm): 68 Weight(lbs): 218 Blood Pressure(mmHg): 188/86 Body Mass Index(BMI): 31.3 Temperature(F): 98.1 Respiratory Rate(breaths/min): 18 [1:Photos:] [N/A:N/A] Left, Anterior Knee N/A N/A Wound Location: Skin Tear/Laceration N/A N/A Wounding Event: Skin Tear N/A N/A Primary Etiology: Cataracts, Glaucoma, Middle ear  N/A N/A Comorbid History: problems, Anemia, Sleep Apnea, Congestive Heart Failure, Coronary Artery Disease, Hypertension, Type II Diabetes, Osteoarthritis, Neuropathy 06/19/2023 N/A N/A Date Acquired: 3 N/A N/A Weeks of Treatment: Open N/A N/A Wound Status: No N/A N/A Wound Recurrence: 0.1x0.1x0.1 N/A N/A Measurements L x W x D (cm) 0.008 N/A N/A A (cm) : rea 0.001 N/A N/A Volume (cm) : 93.20% N/A N/A % Reduction in A rea: 91.70% N/A N/A % Reduction in Volume: Partial Thickness N/A N/A Classification: Small N/A N/A Exudate A mount: Serosanguineous N/A N/A Exudate Type: red, brown N/A N/A Exudate Color: Distinct, outline attached N/A N/A Wound Margin: Logue,  [name] ([id number]) [id number] Page 3 of 7 N/A N/A N/A Necrotic Amount: Eschar N/A N/A Necrotic Tissue: Fascia: No N/A N/A Exposed Structures: Fat Layer (Subcutaneous Tissue): No Tendon: No Muscle: No Joint: No Bone: No Large (67-100%) N/A N/A Epithelialization: Debridement - Selective/Open Wound N/A N/A Debridement: Pre-procedure Verification/Time Out 13:40 N/A N/A Taken: Lidocaine 4% Topical Solution N/A N/A Pain Control: Skin/Epidermis N/A N/A Level: 0.01 N/A N/A Debridement A (sq cm): rea Forceps, Scissors N/A N/A Instrument: Minimum N/A N/A Bleeding: Pressure N/A N/A Hemostasis A chieved: 0 N/A N/A Procedural Pain: 0 N/A N/A Post Procedural Pain: Procedure was tolerated well N/A N/A Debridement Treatment Response: 0.1x0.1x0.1 N/A N/A Post Debridement Measurements L x W x D (cm) 0.001 N/A N/A Post Debridement Volume: (cm) Scarring: Yes N/A N/A Periwound Skin Texture: Excoriation: No Induration: No Callus: No Crepitus: No Rash: No Maceration: No N/A N/A Periwound Skin Moisture: Dry/Scaly: No Atrophie Blanche: No N/A N/A Periwound Skin Color: Cyanosis: No Ecchymosis: No Erythema: No Hemosiderin Staining: No Mottled: No Pallor: No Rubor:  No Debridement N/A N/A Procedures Performed: Treatment Notes Wound #1 (Knee) Wound Laterality: Left, Anterior Cleanser Soap and Water Discharge Instruction: May shower and wash wound with dial antibacterial soap and water prior to dressing change. Peri-Wound Care Topical bacitracin ointment Discharge Instruction: apply directly to wound bed. Primary Dressing Secondary Dressing Zetuvit Plus Silicone Border Dressing 4x4 (in/in) Discharge Instruction: Apply silicone border over primary dressing or a bandaid. Secured With Compression Wrap Compression Stockings Facilities manager) Signed: 08/17/2023 4:36:31 PM By: Geralyn Corwin DO Entered By: Geralyn Corwin on 08/17/2023 14:52:56 [name], [name] ([id number]) [id number] Page 4 of 7 -------------------------------------------------------------------------------- Multi-Disciplinary Care Plan Details Patient Name: Date of Service: [name], [name] 08/17/2023 1:15 PM Medical Record Number: [id number] Patient Account Number: [id number] Date of Birth/Sex: Treating RN: [birth year] (72 y.o. [name] Primary Care Mercy Leppla: [name] Other Clinician: Referring Asuncion Tapscott: Treating Suhayb Anzalone/Extender: [name] in Treatment: 3 Active Inactive Abuse / Safety / Falls / Self Care Management Nursing Diagnoses: Impaired physical mobility Goals: Patient will remain injury free Date Initiated: 07/23/2023 Target Resolution Date: 08/28/2023 Goal Status: Active Patient/caregiver will verbalize understanding of skin care regimen Date Initiated: 07/23/2023 Target Resolution Date: 08/28/2023 Goal Status: Active Interventions: Assess fall risk on admission and as needed Provide education on basic hygiene Provide education on fall prevention Notes: Electronic Signature(s) Signed: 08/18/2023 5:33:37 PM By: Elijah Stall RN, BSN Entered By: [name] on 08/17/2023  13:42:44 -------------------------------------------------------------------------------- Pain Assessment Details Patient Name: Date of Service: [name], [name] 08/17/2023 1:15 PM Medical Record Number: [id number] Patient Account Number: [id number] Date of Birth/Sex: Treating RN: [name] (72 y.o. M) Primary Care Abdulrahman Bracey: [name] Other Clinician: Referring Sarahelizabeth Conway: Treating Makisha Marrin/Extender: [name] in Treatment: 3 Active Problems Location of Pain Severity and Description of Pain Patient Has Paino No Site Locations [name], [name] ([id number]) [id number] Page 5 of 7 Pain Management and Medication Current Pain Management: Electronic Signature(s) Signed: 08/17/2023 1:56:44 PM By: [name] Entered By: [name] on 08/17/2023 13:28:51 -------------------------------------------------------------------------------- Patient/Caregiver Education Details Patient Name: Date of Service: [name] 8/26/2024andnbsp1:15 PM Medical Record Number: [id number] Patient Account Number: [id number] Date of Birth/Gender: Treating RN: [name] (72 y.o. [name] Primary Care Physician: [name] Other Clinician: Referring Physician: Treating Physician/Extender: [name] in Treatment: 3 Education Assessment Education Provided To: Patient Education Topics Provided Wound/Skin Impairment: Handouts: Caring for Your Ulcer Methods: Explain/Verbal Responses: Reinforcements needed  Electronic Signature(s) Signed: 08/18/2023 5:33:37 PM By: Elijah Stall RN, BSN Entered By: [name] on 08/17/2023 13:43:46 -------------------------------------------------------------------------------- Wound Assessment Details Patient Name: Date of Service: [name], [name] 08/17/2023 1:15 PM Medical Record Number: [id number] Patient Account Number: [id number] Date of Birth/Sex: Treating RN: [birth year] (72  y.o. M) Primary Care Yakub Lodes: [name] Other Clinician: DRAVEN, [name] ([id number]) [id number] Page 6 of 7 Referring Tiffny Gemmer: Treating Gearldean Lomanto/Extender: [name] in Treatment: 3 Wound Status Wound Number: 1 Primary Skin T ear Etiology: Wound Location: Left, Anterior Knee Wound Open Wounding Event: Skin Tear/Laceration Status: Date Acquired: 06/19/2023 Comorbid Cataracts, Glaucoma, Middle ear problems, Anemia, Sleep Apnea, Weeks Of Treatment: 3 History: Congestive Heart Failure, Coronary Artery Disease, Hypertension, Clustered Wound: No Type II Diabetes, Osteoarthritis, Neuropathy Photos Wound Measurements Length: (cm) 0.1 Width: (cm) 0.1 Depth: (cm) 0.1 Area: (cm) 0.008 Volume: (cm) 0.001 % Reduction in Area: 93.2% % Reduction in Volume: 91.7% Epithelialization: Large (67-100%) Wound Description Classification: Partial Thickness Wound Margin: Distinct, outline attached Exudate Amount: Small Exudate Type: Serosanguineous Exudate Color: red, brown Foul Odor After Cleansing: No Slough/Fibrino Yes Wound Bed Necrotic Amount: Large (67-100%) Exposed Structure Necrotic Quality: Eschar Fascia Exposed: No Fat Layer (Subcutaneous Tissue) Exposed: No Tendon Exposed: No Muscle Exposed: No Joint Exposed: No Bone Exposed: No Periwound Skin Texture Texture Color No Abnormalities Noted: No No Abnormalities Noted: No Callus: No Atrophie Blanche: No Crepitus: No Cyanosis: No Excoriation: No Ecchymosis: No Induration: No Erythema: No Rash: No Hemosiderin Staining: No Scarring: Yes Mottled: No Pallor: No Moisture Rubor: No No Abnormalities Noted: No Dry / Scaly: No Maceration: No Treatment Notes Wound #1 (Knee) Wound Laterality: Left, Anterior Cleanser Soap and Water Discharge Instruction: May shower and wash wound with dial antibacterial soap and water prior to dressing change. Peri-Wound  Care Topical KASHIUS, CALVEY ([id number]) [id number] Page 7 of 7 bacitracin ointment Discharge Instruction: apply directly to wound bed. Primary Dressing Secondary Dressing Zetuvit Plus Silicone Border Dressing 4x4 (in/in) Discharge Instruction: Apply silicone border over primary dressing or a bandaid. Secured With Compression Wrap Compression Stockings Facilities manager) Signed: 08/17/2023 1:56:44 PM By: [name] Entered By: [name] on 08/17/2023 13:32:42 -------------------------------------------------------------------------------- Vitals Details Patient Name: Date of Service: [name], [name] 08/17/2023 1:15 PM Medical Record Number: [id number] Patient Account Number: [id number] Date of Birth/Sex: Treating RN: [name] (72 y.o. M) Primary Care Ludwig Tugwell: [name] Other Clinician: Referring Melville Engen: Treating Patrece Tallie/Extender: [name] in Treatment: 3 Vital Signs Time Taken: 01:28 Temperature (F): 98.1 Height (in): 70 Pulse (bpm): 68 Weight (lbs): 218 Respiratory Rate (breaths/min): 18 Body Mass Index (BMI): 31.3 Blood Pressure (mmHg): 188/86 Reference Range: 80 - 120 mg / dl Electronic Signature(s) Signed: 08/17/2023 1:56:44 PM By: [name] Entered By: [name] on 08/17/2023 13:28:43

## 2023-08-19 NOTE — Progress Notes (Signed)
[name] ([id number]) [id number] Page 1 of 8 Visit Report for 08/17/2023 Chief Complaint Document Details Patient Name: Date of Service: [name], [name] 08/17/2023 1:15 PM Medical Record Number: [id number] Patient Account Number: [id number] Date of Birth/Sex: Treating RN: [birth year] (72 y.o. M) Primary Care Provider: Sanda Linger Other Clinician: Referring Provider: Treating Provider/Extender: Marja Kays in Treatment: 3 Information Obtained from: Patient Chief Complaint 07/23/2023; left knee wound Electronic Signature(s) Signed: 08/17/2023 4:36:31 PM By: Elijah Corwin DO Entered By: [name] on 08/17/2023 14:53:04 -------------------------------------------------------------------------------- Debridement Details Patient Name: Date of Service: [name], [name] 08/17/2023 1:15 PM Medical Record Number: [id number] Patient Account Number: [id number] Date of Birth/Sex: Treating RN: 12-20-1951 (72 y.o. [name], [name] Primary Care Provider: Sanda Linger Other Clinician: Referring Provider: Treating Provider/Extender: Marja Kays in Treatment: 3 Debridement Performed for Assessment: Wound #1 Left,Anterior Knee Performed By: Physician Elijah Corwin, DO Debridement Type: Debridement Level of Consciousness (Pre-procedure): Awake and Alert Pre-procedure Verification/Time Out Yes - 13:40 Taken: Start Time: 13:41 Pain Control: Lidocaine 4% T opical Solution Percent of Wound Bed Debrided: 100% T Area Debrided (cm): otal 0.01 Tissue and other material debrided: Viable, Skin: Dermis , Skin: Epidermis Level: Skin/Epidermis Debridement Description: Selective/Open Wound Instrument: Forceps, Scissors Bleeding: Minimum Hemostasis Achieved: Pressure End Time: 13:44 Procedural Pain: 0 Post Procedural Pain: 0 Response to Treatment: Procedure was tolerated well Level of Consciousness (Post- Awake  and Alert procedure): Post Debridement Measurements of Total Wound Length: (cm) 0.1 Width: (cm) 0.1 Depth: (cm) 0.1 Volume: (cm) 0.001 Character of Wound/Ulcer Post Debridement: Improved [name], [name] ([id number]) [id number] Page 2 of 8 Post Procedure Diagnosis Same as Pre-procedure Electronic Signature(s) Signed: 08/17/2023 4:36:31 PM By: Elijah Corwin DO Signed: 08/18/2023 5:33:37 PM By: Elijah Stall RN, BSN Entered By: [name] on 08/17/2023 13:44:32 -------------------------------------------------------------------------------- HPI Details Patient Name: Date of Service: [name] 08/17/2023 1:15 PM Medical Record Number: [id number] Patient Account Number: [id number] Date of Birth/Sex: Treating RN: [birth year] (72 y.o. M) Primary Care Provider: Sanda Linger Other Clinician: Referring Provider: Treating Provider/Extender: Marja Kays in Treatment: 3 History of Present Illness HPI Description: 07/23/2023 [name] is a 72 year old male with a past medical history of controlled insulin-dependent type 2 diabetes and OSA That presents to the clinic for a 1-2- month history of nonhealing wound to the left knee. He states that he fell creating a wound. He has been following with his primary care office for this issue. He has been using Vaseline to the area. He has not taken any oral antibiotics for this issue. He currently denies signs of infection. Compared to pictures from when this first started the wound is smaller. 8/26; patient presents for follow-up. He has been using Xeroform to the wound bed however patient states this has not been staying in place very well. He states he banged his knee today creating a blister to the wound site. Electronic Signature(s) Signed: 08/17/2023 4:36:31 PM By: Elijah Corwin DO Entered By: [name] on 08/17/2023  14:57:09 -------------------------------------------------------------------------------- Physical Exam Details Patient Name: Date of Service: [name], [name] 08/17/2023 1:15 PM Medical Record Number: [id number] Patient Account Number: [id number] Date of Birth/Sex: Treating RN: [birth year] (72 y.o. M) Primary Care Provider: Sanda Linger Other Clinician: Referring Provider: Treating Provider/Extender: Marja Kays in Treatment: 3 Constitutional respirations regular, non-labored and within target range for patient.Marland Kitchen Psychiatric pleasant and cooperative. Notes T the right knee there is a blister. Post debridement  there was healthy granulation tissue throughout. No signs of surrounding infection. o Electronic Signature(s) Signed: 08/17/2023 4:36:31 PM By: Elijah Corwin DO Entered By: [name] on 08/17/2023 14:57:53 [name] ([id number]) [id number] Page 3 of 8 -------------------------------------------------------------------------------- Physician Orders Details Patient Name: Date of Service: [name], [name] 08/17/2023 1:15 PM Medical Record Number: [id number] Patient Account Number: [id number] Date of Birth/Sex: Treating RN: [birth year] (72 y.o. [name] Primary Care Provider: Sanda Linger Other Clinician: Referring Provider: Treating Provider/Extender: Marja Kays in Treatment: 3 Verbal / Phone Orders: No Diagnosis Coding ICD-10 Coding Code Description [phone] Non-pressure chronic ulcer of other part of left lower leg limited to breakdown of skin T79.8XXA Other early complications of trauma, initial encounter E11.622 Type 2 diabetes mellitus with other skin ulcer Follow-up Appointments ppointment in 1 week. - Dr. Mikey Bussing Thursday 2pm 08/27/2023 room 9 Return A Other: - over the counter bacitracin ointment apply daily. Anesthetic (In clinic) Topical Lidocaine 4% applied to  wound bed Bathing/ Shower/ Hygiene May shower and wash wound with soap and water. Edema Control - Lymphedema / SCD / Other Avoid standing for long periods of time. Additional Orders / Instructions Other: - diabetic shoes or Geri sleeves to feet and legs protection of skin when skin tears. Wound Treatment Wound #1 - Knee Wound Laterality: Left, Anterior Cleanser: Soap and Water 1 x Per Day/30 Days Discharge Instructions: May shower and wash wound with dial antibacterial soap and water prior to dressing change. Topical: bacitracin ointment 1 x Per Day/30 Days Discharge Instructions: apply directly to wound bed. Secondary Dressing: Zetuvit Plus Silicone Border Dressing 4x4 (in/in) 1 x Per Day/30 Days Discharge Instructions: Apply silicone border over primary dressing or a bandaid. Electronic Signature(s) Signed: 08/17/2023 4:36:31 PM By: Elijah Corwin DO Entered By: [name] on 08/17/2023 14:58:01 -------------------------------------------------------------------------------- Problem List Details Patient Name: Date of Service: [name], [name] 08/17/2023 1:15 PM Medical Record Number: [id number] Patient Account Number: [id number] Date of Birth/Sex: Treating RN: 01-06-1951 (72 y.o. [name] Primary Care Provider: Sanda Linger Other Clinician: Referring Provider: Treating Provider/Extender: Marja Kays in Treatment: 3 [name], [name] ([id number]) [id number] Page 4 of 8 Active Problems ICD-10 Encounter Code Description Active Date MDM Diagnosis L97.821 Non-pressure chronic ulcer of other part of left lower leg limited to breakdown 07/23/2023 No Yes of skin T79.8XXA Other early complications of trauma, initial encounter 07/23/2023 No Yes E11.622 Type 2 diabetes mellitus with other skin ulcer 07/23/2023 No Yes Inactive Problems Resolved Problems Electronic Signature(s) Signed: 08/17/2023 4:36:31 PM By: Elijah Corwin  DO Previous Signature: 08/17/2023 1:35:31 PM Version By: Elijah Corwin DO Entered By: [name] on 08/17/2023 14:52:50 -------------------------------------------------------------------------------- Progress Note Details Patient Name: Date of Service: [name], [name] 08/17/2023 1:15 PM Medical Record Number: [id number] Patient Account Number: [id number] Date of Birth/Sex: Treating RN: June 22, 1951 (72 y.o. M) Primary Care Provider: Sanda Linger Other Clinician: Referring Provider: Treating Provider/Extender: Marja Kays in Treatment: 3 Subjective Chief Complaint Information obtained from Patient 07/23/2023; left knee wound History of Present Illness (HPI) 07/23/2023 [name] is a 72 year old male with a past medical history of controlled insulin-dependent type 2 diabetes and OSA That presents to the clinic for a 1-2- month history of nonhealing wound to the left knee. He states that he fell creating a wound. He has been following with his primary care office for this issue. He has been using Vaseline to the area. He has not taken any oral  antibiotics for this issue. He currently denies signs of infection. Compared to pictures from when this first started the wound is smaller. 8/26; patient presents for follow-up. He has been using Xeroform to the wound bed however patient states this has not been staying in place very well. He states he banged his knee today creating a blister to the wound site. Patient History Family History Cancer - Mother,Siblings, Diabetes - Mother,Siblings, No family history of Heart Disease, Hereditary Spherocytosis, Hypertension, Kidney Disease, Lung Disease, Seizures, Stroke, Thyroid Problems, Tuberculosis. Social History Never smoker, Marital Status - Married, Alcohol Use - Never, Drug Use - No History, Caffeine Use - Daily. Medical History Eyes Patient has history of Cataracts - removed,  Glaucoma Ear/Nose/Mouth/Throat Patient has history of Middle ear problems Denies history of Chronic sinus problems/congestion Hematologic/Lymphatic Patient has history of Anemia [name], [name] [name] ([id number]) [id number] Page 5 of 8 Denies history of Hemophilia, Human Immunodeficiency Virus, Lymphedema, Sickle Cell Disease Respiratory Patient has history of Sleep Apnea Denies history of Aspiration, Asthma, Chronic Obstructive Pulmonary Disease (COPD), Pneumothorax, Tuberculosis Cardiovascular Patient has history of Congestive Heart Failure, Coronary Artery Disease, Hypertension Denies history of Angina, Arrhythmia, Deep Vein Thrombosis, Hypotension, Myocardial Infarction, Peripheral Arterial Disease, Peripheral Venous Disease, Phlebitis, Vasculitis Gastrointestinal Denies history of Cirrhosis , Colitis, Crohns, Hepatitis A, Hepatitis [name], Hepatitis C Endocrine Patient has history of Type II Diabetes Denies history of Type I Diabetes Genitourinary Denies history of End Stage Renal Disease Immunological Denies history of Lupus Erythematosus, Raynauds, Scleroderma Integumentary (Skin) Denies history of History of Burn Musculoskeletal Patient has history of Osteoarthritis Denies history of Gout, Rheumatoid Arthritis, Osteomyelitis Neurologic Patient has history of Neuropathy Denies history of Dementia, Quadriplegia, Paraplegia, Seizure Disorder Oncologic Denies history of Received Chemotherapy, Received Radiation Psychiatric Denies history of Anorexia/bulimia, Confinement Anxiety Hospitalization/Surgery History - 2016 lumbar laminectomy/ decompression microdiscectomy. - cholecystectomy. - neck surgery. - lumbar fusion. - peptic ulcer disease. - stents x 2. Medical A Surgical History Notes nd Eyes legally blind Ear/Nose/Mouth/Throat ringing in ears Objective Constitutional respirations regular, non-labored and within target range for patient.. Vitals  Time Taken: 1:28 AM, Height: 70 in, Weight: 218 lbs, BMI: 31.3, Temperature: 98.1 F, Pulse: 68 bpm, Respiratory Rate: 18 breaths/min, Blood Pressure: 188/86 mmHg. Psychiatric pleasant and cooperative. General Notes: T the right knee there is a blister. Post debridement there was healthy granulation tissue throughout. No signs of surrounding infection. o Integumentary (Hair, Skin) Wound #1 status is Open. Original cause of wound was Skin T ear/Laceration. The date acquired was: 06/19/2023. The wound has been in treatment 3 weeks. The wound is located on the Left,Anterior Knee. The wound measures 0.1cm length x 0.1cm width x 0.1cm depth; 0.008cm^2 area and 0.001cm^3 volume. There is a small amount of serosanguineous drainage noted. The wound margin is distinct with the outline attached to the wound base. There is a large (67-100%) amount of necrotic tissue within the wound bed including Eschar. The periwound skin appearance exhibited: Scarring. The periwound skin appearance did not exhibit: Callus, Crepitus, Excoriation, Induration, Rash, Dry/Scaly, Maceration, Atrophie Blanche, Cyanosis, Ecchymosis, Hemosiderin Staining, Mottled, Pallor, Rubor, Erythema. Assessment Active Problems ICD-10 Non-pressure chronic ulcer of other part of left lower leg limited to breakdown of skin Other early complications of trauma, initial encounter Type 2 diabetes mellitus with other skin ulcer Patient's wound appears well-healing. Unfortunately developed a blister and I removed devitalized tissue. I recommended antibiotic ointment and a BRONC, BUCKBEE [name] ([id number]) [id number] Page 6 of 8 to this area. This should have  no trouble healing up. Follow-up in 1 week. Procedures Wound #1 Pre-procedure diagnosis of Wound #1 is a Skin T located on the Left,Anterior Knee . There was a Selective/Open Wound Skin/Epidermis Debridement with a ear total area of 0.01 sq cm performed by  Elijah Corwin, DO. With the following instrument(s): Forceps, and Scissors to remove Viable tissue/material. Material removed includes Skin: Dermis and Skin: Epidermis and after achieving pain control using Lidocaine 4% T opical Solution. A time out was conducted at 13:40, prior to the start of the procedure. A Minimum amount of bleeding was controlled with Pressure. The procedure was tolerated well with a pain level of 0 throughout and a pain level of 0 following the procedure. Post Debridement Measurements: 0.1cm length x 0.1cm width x 0.1cm depth; 0.001cm^3 volume. Character of Wound/Ulcer Post Debridement is improved. Post procedure Diagnosis Wound #1: Same as Pre-Procedure Plan Follow-up Appointments: Return Appointment in 1 week. - Dr. Mikey Bussing Thursday 2pm 08/27/2023 room 9 Other: - over the counter bacitracin ointment apply daily. Anesthetic: (In clinic) Topical Lidocaine 4% applied to wound bed Bathing/ Shower/ Hygiene: May shower and wash wound with soap and water. Edema Control - Lymphedema / SCD / Other: Avoid standing for long periods of time. Additional Orders / Instructions: Other: - diabetic shoes or Geri sleeves to feet and legs protection of skin when skin tears. WOUND #1: - Knee Wound Laterality: Left, Anterior Cleanser: Soap and Water 1 x Per Day/30 Days Discharge Instructions: May shower and wash wound with dial antibacterial soap and water prior to dressing change. Topical: bacitracin ointment 1 x Per Day/30 Days Discharge Instructions: apply directly to wound bed. Secondary Dressing: Zetuvit Plus Silicone Border Dressing 4x4 (in/in) 1 x Per Day/30 Days Discharge Instructions: Apply silicone border over primary dressing or a bandaid. 1. In office sharp debridement 2. Antibiotic ointment 3. Follow-up in 1 week Electronic Signature(s) Signed: 08/17/2023 4:36:31 PM By: Elijah Corwin DO Entered By: [name] on 08/17/2023  14:58:54 -------------------------------------------------------------------------------- HxROS Details Patient Name: Date of Service: KEIMARI, SCATENA 08/17/2023 1:15 PM Medical Record Number: [id number] Patient Account Number: [id number] Date of Birth/Sex: Treating RN: 1951-01-17 (72 y.o. M) Primary Care Provider: Sanda Linger Other Clinician: Referring Provider: Treating Provider/Extender: Marja Kays in Treatment: 3 Eyes Medical History: Positive for: Cataracts - removed; Glaucoma Past Medical History Notes: legally blind Ear/Nose/Mouth/Throat Medical History: Positive for: Middle ear problems Negative for: Chronic sinus problems/congestion Past Medical History Notes: ringing in ears RECE, [name] [name] ([id number]) [id number] Page 7 of 8 Hematologic/Lymphatic Medical History: Positive for: Anemia Negative for: Hemophilia; Human Immunodeficiency Virus; Lymphedema; Sickle Cell Disease Respiratory Medical History: Positive for: Sleep Apnea Negative for: Aspiration; Asthma; Chronic Obstructive Pulmonary Disease (COPD); Pneumothorax; Tuberculosis Cardiovascular Medical History: Positive for: Congestive Heart Failure; Coronary Artery Disease; Hypertension Negative for: Angina; Arrhythmia; Deep Vein Thrombosis; Hypotension; Myocardial Infarction; Peripheral Arterial Disease; Peripheral Venous Disease; Phlebitis; Vasculitis Gastrointestinal Medical History: Negative for: Cirrhosis ; Colitis; Crohns; Hepatitis A; Hepatitis [name]; Hepatitis C Endocrine Medical History: Positive for: Type II Diabetes Negative for: Type I Diabetes Time with diabetes: 25 year + Treated with: Insulin Blood sugar tested every day: No Genitourinary Medical History: Negative for: End Stage Renal Disease Immunological Medical History: Negative for: Lupus Erythematosus; Raynauds; Scleroderma Integumentary (Skin) Medical History: Negative for: History  of Burn Musculoskeletal Medical History: Positive for: Osteoarthritis Negative for: Gout; Rheumatoid Arthritis; Osteomyelitis Neurologic Medical History: Positive for: Neuropathy Negative for: Dementia; Quadriplegia; Paraplegia; Seizure Disorder Oncologic Medical History: Negative for: Received Chemotherapy;  Received Radiation Psychiatric Medical History: Negative for: Anorexia/bulimia; Confinement Anxiety HBO Extended History Items Eyes: Eyes: Ear/Nose/Mouth/Throat: Cataracts Glaucoma Middle ear problems Immunizations Pneumococcal VaccineBLEU, REICHEL ([id number]) [id number] Page 8 of 8 Received Pneumococcal Vaccination: Yes Received Pneumococcal Vaccination On or After 60th Birthday: Yes Implantable Devices No devices added Hospitalization / Surgery History Type of Hospitalization/Surgery 2016 lumbar laminectomy/ decompression microdiscectomy cholecystectomy neck surgery lumbar fusion peptic ulcer disease stents x 2 Family and Social History Cancer: Yes - Mother,Siblings; Diabetes: Yes - Mother,Siblings; Heart Disease: No; Hereditary Spherocytosis: No; Hypertension: No; Kidney Disease: No; Lung Disease: No; Seizures: No; Stroke: No; Thyroid Problems: No; Tuberculosis: No; Never smoker; Marital Status - Married; Alcohol Use: Never; Drug Use: No History; Caffeine Use: Daily; Financial Concerns: No; Food, Clothing or Shelter Needs: No; Support System Lacking: No; Transportation Concerns: No Electronic Signature(s) Signed: 08/17/2023 4:36:31 PM By: Elijah Corwin DO Entered By: [name] on 08/17/2023 14:57:19 -------------------------------------------------------------------------------- SuperBill Details Patient Name: Date of Service: [name] 08/17/2023 Medical Record Number: [id number] Patient Account Number: [id number] Date of Birth/Sex: Treating RN: 1951/09/02 (72 y.o. [name] Primary Care Provider: Sanda Linger Other Clinician: Referring Provider: Treating Provider/Extender: Marja Kays in Treatment: 3 Diagnosis Coding ICD-10 Codes Code Description [phone] Non-pressure chronic ulcer of other part of left lower leg limited to breakdown of skin T79.8XXA Other early complications of trauma, initial encounter E11.622 Type 2 diabetes mellitus with other skin ulcer Facility Procedures : CPT4 Code: [id number] Description: [phone] - DEBRIDE WOUND 1ST 20 SQ CM OR < ICD-10 Diagnosis Description L97.821 Non-pressure chronic ulcer of other part of left lower leg limited to breakdown o E11.622 Type 2 diabetes mellitus with other skin ulcer Modifier: f skin Quantity: 1 Physician Procedures : CPT4 Code Description Modifier [id number] - WC PHYS DEBR WO ANESTH 20 SQ CM ICD-10 Diagnosis Description L97.821 Non-pressure chronic ulcer of other part of left lower leg limited to breakdown of skin E11.622 Type 2 diabetes mellitus with other skin  ulcer Quantity: 1 Electronic Signature(s) Signed: 08/17/2023 4:36:31 PM By: Elijah Corwin DO Entered By: [name] on 08/17/2023 14:59:21

## 2023-08-25 ENCOUNTER — Encounter: Payer: Self-pay | Admitting: Internal Medicine

## 2023-08-25 ENCOUNTER — Other Ambulatory Visit: Payer: Self-pay | Admitting: Internal Medicine

## 2023-08-25 DIAGNOSIS — E119 Type 2 diabetes mellitus without complications: Secondary | ICD-10-CM

## 2023-08-25 DIAGNOSIS — K219 Gastro-esophageal reflux disease without esophagitis: Secondary | ICD-10-CM

## 2023-08-25 DIAGNOSIS — I2510 Atherosclerotic heart disease of native coronary artery without angina pectoris: Secondary | ICD-10-CM

## 2023-08-25 DIAGNOSIS — E118 Type 2 diabetes mellitus with unspecified complications: Secondary | ICD-10-CM

## 2023-08-25 DIAGNOSIS — E785 Hyperlipidemia, unspecified: Secondary | ICD-10-CM

## 2023-08-25 DIAGNOSIS — I5042 Chronic combined systolic (congestive) and diastolic (congestive) heart failure: Secondary | ICD-10-CM

## 2023-08-25 NOTE — Progress Notes (Signed)
[facility] [address] [place]. [place] [address] [phone]       OFFICE FOLLOW UP NOTE  [name] Date of Birth:  1951-07-11 Medical Record Number:  [id number]   Reason for visit: Initial CPAP follow-up    SUBJECTIVE:   CHIEF COMPLAINT:  Chief Complaint  Patient presents with   Follow-up    Rm 8, here with wife Phelinda  Pt is here for initial Autopap. Pt states he is doing well. States he is sleeping between 6.5 to 7 hours at night. States he also uses it during the daytime with his naps uses between 2-3 hours.     Follow-up visit:  Prior visit: 04/02/2023 with Dr. Frances Furbish  Brief HPI:   [name] is a 72 y.o. male who was initially seen by Dr. Frances Furbish on 04/02/2023 to reestablish care for known sleep apnea on CPAP and need of new machine. ESS 23/24. FSS 63/63.  Noted suboptimal compliance with current machine.  HST 05/2023 showed severe OSA with total AHI of 61.5/h and O2 nadir of 82%.  Noted moderate central apnea component.  AutoPap set up 06/19/2023.   Interval history:  CPAP compliance report shows satisfactory usage and optimal residual AHI. Reports tolerating CPAP well, use of nasal pillow mask. Has noticed some improvement of daytime fatigue and sleep quality. Will use during night time sleep as well as any daytime naps.  ESS 20/24. FSS 55/63.  Followed by DME [facility]             ROS:   14 system review of systems performed and negative with exception of those listed in HPI  PMH:  Past Medical History:  Diagnosis Date   Arthritis    "mild arthritis in hip"   CAD (coronary artery disease)    CHF (congestive heart failure) (HCC)    Complication of anesthesia    "lungs filled up with fluid"    Diabetes mellitus    Type II   Glaucoma    Heart murmur    HTN (hypertension)    Hypercholesterolemia    LBP (low back pain)    Legally blind    Peripheral neuropathy    Sleep apnea    c-pap nightly   Staph infection     "from back surgery"   Status post insertion of drug eluting coronary artery stent    Trigger finger    Ulcer    Wears glasses    "to protect cornea" - legally blind    PSH:  Past Surgical History:  Procedure Laterality Date   CHOLECYSTECTOMY  09/15/2012   Procedure: LAPAROSCOPIC CHOLECYSTECTOMY WITH INTRAOPERATIVE CHOLANGIOGRAM;  Surgeon: Valarie Merino, MD;  Location: [facility] OR;  Service: General;  Laterality: N/A;   COLONOSCOPY     EYE SURGERY Bilateral    cataracts   INGUINAL HERNIA REPAIR     right   LUMBAR FUSION     LUMBAR LAMINECTOMY/DECOMPRESSION MICRODISCECTOMY N/A 10/26/2015   Procedure: RIGHT AND CENTRAL LUMBAR LAMINECTOMY L3-4, RIGHT L5-S1 LATERAL RECESS DECOMPRESSION;  Surgeon: Kerrin Champagne, MD;  Location: [facility] OR;  Service: Orthopedics;  Laterality: N/A;   NECK SURGERY     peptic ulcer dz surgery  pt was in his 15s   bleeding ulcer.    Prosthetic Cornea placement, Right eye  2007   [facility]   TRIGGER FINGER RELEASE Right 10/26/2015   Procedure: RELEASE TRIGGER FINGER RIGHT THUMB;  Surgeon: Kerrin Champagne, MD;  Location: [facility] OR;  Service: Orthopedics;  Laterality: Right;    Social History:  Social History   Socioeconomic History   Marital status: Married    Spouse name: [name]   Number of children: 3   Years of education: 12   Highest education level: Not on file  Occupational History   Occupation: disabled    Comment: blind  Tobacco Use   Smoking status: Never   Smokeless tobacco: Never  Vaping Use   Vaping status: Never Used  Substance and Sexual Activity   Alcohol use: No   Drug use: No   Sexual activity: Not Currently  Other Topics Concern   Not on file  Social History Narrative   Occupation: disabled, blind   Married   Regular Exercise-no   Lives at home with his wife.   Right-handed.   2-3 cups caffeine per day.   Social Determinants of Health   Financial Resource Strain: Low Risk  (06/11/2023)   Overall Financial Resource Strain  (CARDIA)    Difficulty of Paying Living Expenses: Not hard at all  Food Insecurity: No Food Insecurity (06/11/2023)   Hunger Vital Sign    Worried About Running Out of Food in the Last Year: Never true    Ran Out of Food in the Last Year: Never true  Transportation Needs: No Transportation Needs (06/11/2023)   PRAPARE - Administrator, Civil Service (Medical): No    Lack of Transportation (Non-Medical): No  Physical Activity: Inactive (06/11/2023)   Exercise Vital Sign    Days of Exercise per Week: 0 days    Minutes of Exercise per Session: 0 min  Stress: No Stress Concern Present (06/11/2023)   [employer] of Occupational Health - Occupational Stress Questionnaire    Feeling of Stress : Not at all  Social Connections: Socially Integrated (06/11/2023)   Social Connection and Isolation Panel [NHANES]    Frequency of Communication with Friends and Family: More than three times a week    Frequency of Social Gatherings with Friends and Family: More than three times a week    Attends Religious Services: More than 4 times per year    Active Member of [employer] or Organizations: Yes    Attends Engineer, structural: More than 4 times per year    Marital Status: Married  Catering manager Violence: Not At Risk (06/11/2023)   Humiliation, Afraid, Rape, and Kick questionnaire    Fear of Current or Ex-Partner: No    Emotionally Abused: No    Physically Abused: No    Sexually Abused: No    Family History:  Family History  Problem Relation Age of Onset   Breast cancer Mother    Colon cancer Mother    Hypertension Mother    Diabetes Mother    Hypertension Father    Sleep apnea Sister    Diabetes Sister    Sleep apnea Brother    Diabetes Brother    Colon cancer Other        Elevated Risk for   Esophageal cancer Neg Hx    Stomach cancer Neg Hx    Rectal cancer Neg Hx     Medications:   Current Outpatient Medications on File Prior to Visit  Medication Sig Dispense  Refill   ASPIRIN LOW DOSE 81 MG tablet TAKE 1 TABLET (81 MG TOTAL) BY MOUTH DAILY. SWALLOW WHOLE. 90 tablet 1   atorvastatin (LIPITOR) 80 MG tablet TAKE 1 TABLET BY MOUTH DAILY AT 6PM. 90 tablet 1  carvedilol (COREG) 12.5 MG tablet Take one (1) tablet by mouth ( 12.5 mg ) twice daily. 180 tablet 3   Cholecalciferol (VITAMIN D3) 25 MCG (1000 UT) CAPS Take by mouth.     Continuous Blood Gluc Receiver (FREESTYLE LIBRE 3 READER) DEVI 1 Act by Does not apply route daily. 1 each 3   Continuous Blood Gluc Sensor (FREESTYLE LIBRE 3 SENSOR) MISC 1 Act by Does not apply route daily. Place 1 sensor on the skin every 14 days. Use to check glucose continuously 2 each 5   empagliflozin (JARDIANCE) 25 MG TABS tablet TAKE 1 TABLET EVERY DAY 90 tablet 1   Evolocumab (REPATHA) 140 MG/ML SOSY INJECT 140MG  UNDER THE SKIN EVERY 2 WEEKS 6 mL 1   Ferric Maltol (ACCRUFER) 30 MG CAPS Take 1 capsule (30 mg total) by mouth in the morning and at bedtime. 180 capsule 1   fluticasone (FLONASE) 50 MCG/ACT nasal spray SPRAY 2 SPRAYS INTO EACH NOSTRIL EVERY DAY 48 mL 1   gabapentin (NEURONTIN) 300 MG capsule Take  capsule three times a day 270 capsule 2   Glucagon 1 MG/0.2ML SOAJ Inject 1 Act into the skin once as needed for up to 1 dose. 0.2 mL 3   icosapent Ethyl (VASCEPA) 1 g capsule Take 2 capsules (2 g total) by mouth 2 (two) times daily. 360 capsule 1   indapamide (LOZOL) 1.25 MG tablet Take 1.25 mg by mouth daily.     Insulin Pen Needle (BD PEN NEEDLE NANO 2ND GEN) 32G X 4 MM MISC Inject 1 Act into the skin daily. 100 each 3   isosorbide mononitrate (IMDUR) 120 MG 24 hr tablet Take 1 tablet (120 mg total) by mouth daily. 90 tablet 1   levocetirizine (XYZAL) 5 MG tablet TAKE 1 TABLET BY MOUTH EVERY DAY IN THE EVENING 90 tablet 3   metFORMIN (GLUCOPHAGE) 500 MG tablet TAKE 1 TABLET TWICE DAILY WITH MEALS 180 tablet 1   nitroGLYCERIN (NITROSTAT) 0.4 MG SL tablet Place 1 tablet (0.4 mg total) under the tongue every 5 (five)  minutes as needed for chest pain. Do not exceed total of 3 dose in 15 minutes. Please make overdue appt with Dr. Anne Fu before anymore refills. Thank you 1st attempt 25 tablet 0   pantoprazole (PROTONIX) 40 MG tablet TAKE 1 TABLET TWICE DAILY 180 tablet 1   ranolazine (RANEXA) 500 MG 12 hr tablet TAKE 1 TABLET TWICE DAILY 180 tablet 1   SOLIQUA 100-33 UNT-MCG/ML SOPN INJECT 60 UNITS INTO THE SKIN DAILY. 45 mL 1   tamsulosin (FLOMAX) 0.4 MG CAPS capsule Take 0.4 mg by mouth once.     traMADol (ULTRAM) 50 MG tablet TAKE 1 TABLET BY MOUTH EVERY 8 HOURS AS NEEDED 90 tablet 2   No current facility-administered medications on file prior to visit.    Allergies:   Allergies  Allergen Reactions   Furosemide     pancreatitis   Olmesartan Other (See Comments)    hyperkalemia   Amlodipine Swelling    LE EDEMA   Diamox [Acetazolamide] Itching    hives   Lisinopril Rash      OBJECTIVE:  Physical Exam  Vitals:   08/26/23 1530  BP: (!) 148/73  Pulse: 68  Weight: 220 lb (99.8 kg)  Height: 5\' 10"  (1.778 m)   Body mass index is 31.57 kg/m. No results found.   General: well developed, well nourished, very pleasant elderly male, seated, in no evident distress Head: head normocephalic and atraumatic.  Neck: supple with no carotid or supraclavicular bruits Cardiovascular: regular rate and rhythm, no murmurs Musculoskeletal: no deformity Skin:  no rash/petichiae Vascular:  Normal pulses all extremities   Neurologic Exam Mental Status: Awake and fully alert. Oriented to place and time. Recent and remote memory intact. Attention span, concentration and fund of knowledge appropriate. Mood and affect appropriate.  Cranial Nerves: Visually impaired.  Hearing intact. Facial sensation intact. Face, tongue, palate moves normally and symmetrically.  Motor: Normal bulk and tone. Normal strength in all tested extremity muscles Sensory.: intact to touch , pinprick , position and vibratory  sensation.  Coordination: Rapid alternating movements normal in all extremities. Finger-to-nose and heel-to-shin performed accurately bilaterally. Gait and Station: Arises from chair without difficulty. Stance is normal. Gait demonstrates normal stride length and balance with use of AD. Reflexes: 1+ and symmetric. Toes downgoing.         ASSESSMENT/PLAN: [name] is a 72 y.o. year old male    OSA on CPAP : Compliance report shows satisfactory usage with optimal residual AHI.  Continue current pressure settings.  Discussed continued nightly usage with ensuring greater than 4 hours nightly for optimal benefit and per insurance purposes.  Continue to follow with DME company for any needed supplies or CPAP related concerns     Follow up in 6 months or call earlier if needed   CC:  PCP: Etta Grandchild, MD    I spent 25 minutes of face-to-face and non-face-to-face time with patient and wife.  This included previsit chart review, lab review, study review, order entry, electronic health record documentation, patient education regarding diagnosis of sleep apnea with review and discussion of compliance report and answered all other questions to patient and wifes satisfaction   Ihor Austin, AGNP-BC  [facility] [address] [place], [place] [address]  Phone [phone] Fax [phone] Note: This document was prepared with digital dictation and possible smart phrase technology. Any transcriptional errors that result from this process are unintentional.

## 2023-08-26 ENCOUNTER — Encounter: Payer: Self-pay | Admitting: Adult Health

## 2023-08-26 ENCOUNTER — Ambulatory Visit: Payer: Medicare PPO | Admitting: Adult Health

## 2023-08-26 VITALS — BP 148/73 | HR 68 | Ht 70.0 in | Wt 220.0 lb

## 2023-08-26 DIAGNOSIS — G4733 Obstructive sleep apnea (adult) (pediatric): Secondary | ICD-10-CM | POA: Diagnosis not present

## 2023-08-26 NOTE — Patient Instructions (Signed)
Your Plan:  Continue nightly use of CPAP with adequate sleep apnea management   Continue to follow with DME [employer] [facility] for any CPAP supplies or CPAP related concerns      Follow up in 6 months or call earlier if needed     Thank you for coming to see [place] at [facility]. I hope we have been able to provide you high quality care today.  You may receive a patient satisfaction survey over the next few weeks. We would appreciate your feedback and comments so that we may continue to improve ourselves and the health of our patients.

## 2023-08-27 ENCOUNTER — Encounter (HOSPITAL_BASED_OUTPATIENT_CLINIC_OR_DEPARTMENT_OTHER): Payer: Medicare PPO | Admitting: Internal Medicine

## 2023-08-29 ENCOUNTER — Other Ambulatory Visit: Payer: Self-pay | Admitting: Internal Medicine

## 2023-08-29 DIAGNOSIS — D508 Other iron deficiency anemias: Secondary | ICD-10-CM

## 2023-08-29 MED ORDER — FERROUS SULFATE 325 (65 FE) MG PO TABS
325.0000 mg | ORAL_TABLET | Freq: Every day | ORAL | 1 refills | Status: DC
Start: 2023-08-29 — End: 2023-11-30

## 2023-09-02 ENCOUNTER — Other Ambulatory Visit: Payer: Self-pay | Admitting: Internal Medicine

## 2023-09-02 DIAGNOSIS — M48062 Spinal stenosis, lumbar region with neurogenic claudication: Secondary | ICD-10-CM

## 2023-09-02 DIAGNOSIS — E0849 Diabetes mellitus due to underlying condition with other diabetic neurological complication: Secondary | ICD-10-CM

## 2023-09-02 DIAGNOSIS — M5416 Radiculopathy, lumbar region: Secondary | ICD-10-CM

## 2023-09-02 MED ORDER — GABAPENTIN 300 MG PO CAPS
300.0000 mg | ORAL_CAPSULE | Freq: Three times a day (TID) | ORAL | 1 refills | Status: DC
Start: 2023-09-02 — End: 2024-07-04

## 2023-09-07 ENCOUNTER — Telehealth: Payer: Self-pay

## 2023-09-07 ENCOUNTER — Encounter (HOSPITAL_BASED_OUTPATIENT_CLINIC_OR_DEPARTMENT_OTHER): Payer: Medicare PPO | Attending: Internal Medicine | Admitting: Internal Medicine

## 2023-09-07 DIAGNOSIS — I2510 Atherosclerotic heart disease of native coronary artery without angina pectoris: Secondary | ICD-10-CM | POA: Diagnosis not present

## 2023-09-07 DIAGNOSIS — E1151 Type 2 diabetes mellitus with diabetic peripheral angiopathy without gangrene: Secondary | ICD-10-CM | POA: Diagnosis not present

## 2023-09-07 DIAGNOSIS — L97821 Non-pressure chronic ulcer of other part of left lower leg limited to breakdown of skin: Secondary | ICD-10-CM | POA: Diagnosis not present

## 2023-09-07 DIAGNOSIS — I509 Heart failure, unspecified: Secondary | ICD-10-CM | POA: Diagnosis not present

## 2023-09-07 DIAGNOSIS — I110 Hypertensive heart disease with heart failure: Secondary | ICD-10-CM | POA: Insufficient documentation

## 2023-09-07 DIAGNOSIS — I890 Lymphedema, not elsewhere classified: Secondary | ICD-10-CM | POA: Diagnosis not present

## 2023-09-07 DIAGNOSIS — E11622 Type 2 diabetes mellitus with other skin ulcer: Secondary | ICD-10-CM | POA: Diagnosis not present

## 2023-09-07 DIAGNOSIS — Z794 Long term (current) use of insulin: Secondary | ICD-10-CM | POA: Insufficient documentation

## 2023-09-07 DIAGNOSIS — E1140 Type 2 diabetes mellitus with diabetic neuropathy, unspecified: Secondary | ICD-10-CM | POA: Insufficient documentation

## 2023-09-07 DIAGNOSIS — S91002A Unspecified open wound, left ankle, initial encounter: Secondary | ICD-10-CM | POA: Diagnosis not present

## 2023-09-07 NOTE — Telephone Encounter (Signed)
Pharmacy Patient Advocate Encounter   Received notification from CoverMyMeds that prior authorization for Nexlizet 180-10MG  tablets is required/requested.   Insurance verification completed.   The patient is insured through [employer] .  Nexlizet discontinued and changed to Repatha. PA request archived. Key: [id number]

## 2023-09-08 NOTE — Progress Notes (Signed)
other part of left lower leg limited to breakdown 07/23/2023 No Yes of skin T79.8XXA Other early complications of trauma, initial encounter 07/23/2023 No Yes E11.622 Type 2 diabetes mellitus with other skin ulcer 07/23/2023 No Yes Inactive Problems Resolved Problems Electronic Signature(s) Signed: 09/07/2023 5:34:19 PM By: Baltazar Najjar MD Entered By: Baltazar Najjar on 09/07/2023 10:48:29 [name] ([id number]) [id number] Page 3 of 4 -------------------------------------------------------------------------------- Progress Note Details Patient Name: Date of Service: [name], [name] 09/07/2023 1:30 PM Medical Record Number: [id number] Patient Account Number: [id number] Date of Birth/Sex: Treating RN: [birth year] (72 y.o. M) Primary Care Provider: Sanda Linger Other Clinician: Referring Provider: Treating Provider/Extender: Madelyn Flavors in Treatment: 6 Subjective History of Present Illness (HPI) 07/23/2023 [name] is a 72 year old male with a past medical history of controlled insulin-dependent type 2 diabetes and OSA That presents to the clinic for a 1-2- month history of nonhealing wound to the left knee. He states that he fell creating a wound. He has been following with his primary care office for this issue. He has been using Vaseline to the area. He has not taken any oral antibiotics for this issue. He currently denies signs of infection. Compared to pictures from when this first started  the wound is smaller. 8/26; patient presents for follow-up. He has been using Xeroform to the wound bed however patient states this has not been staying in place very well. He states he banged his knee today creating a blister to the wound site. 9/16; the patient's area which was initially traumatic on the left tibial tuberosity is completely healed. He is a diabetic but does not have a history of problematic wounds Objective Constitutional Patient is hypertensive.. Pulse regular and within target range for patient.Marland Kitchen Respirations regular, non-labored and within target range.. Temperature is normal and within the target range for the patient.Marland Kitchen Appears in no distress. Vitals Time Taken: 1:40 PM, Height: 70 in, Weight: 218 lbs, BMI: 31.3, Temperature: 98.2 F, Pulse: 65 bpm, Respiratory Rate: 18 breaths/min, Blood Pressure: 160/85 mmHg. General Notes: Wound exam; the area on the left tibial tuberosity is completely healed and epithelialized. No evidence of surrounding infection Integumentary (Hair, Skin) Wound #1 status is Open. Original cause of wound was Skin T ear/Laceration. The date acquired was: 06/19/2023. The wound has been in treatment 6 weeks. The wound is located on the Left,Anterior Knee. The wound measures 0cm length x 0cm width x 0cm depth; 0cm^2 area and 0cm^3 volume. There is no tunneling or undermining noted. There is a none present amount of drainage noted. The wound margin is distinct with the outline attached to the wound base. There is no granulation within the wound bed. There is no necrotic tissue within the wound bed. The periwound skin appearance exhibited: Scarring. The periwound skin appearance did not exhibit: Callus, Crepitus, Excoriation, Induration, Rash, Dry/Scaly, Maceration, Atrophie Blanche, Cyanosis, Ecchymosis, Hemosiderin Staining, Mottled, Pallor, Rubor, Erythema. Assessment Active Problems ICD-10 Non-pressure chronic ulcer of other part of left lower leg  limited to breakdown of skin Other early complications of trauma, initial encounter Type 2 diabetes mellitus with other skin ulcer Plan Discharge From [facility] Services: Discharge from Wound Care Center - Congratulations on your wound healing Bathing/ Shower/ Hygiene: May shower and wash wound with soap and water. Edema Control - Lymphedema / SCD / Other: Avoid standing for long periods of time. CHEEMENG, GAWNE ([id number]) [id number] Page 4 of 4 A dditional Orders / Instructions: Other: - diabetic shoes or Geri sleeves  other part of left lower leg limited to breakdown 07/23/2023 No Yes of skin T79.8XXA Other early complications of trauma, initial encounter 07/23/2023 No Yes E11.622 Type 2 diabetes mellitus with other skin ulcer 07/23/2023 No Yes Inactive Problems Resolved Problems Electronic Signature(s) Signed: 09/07/2023 5:34:19 PM By: Baltazar Najjar MD Entered By: Baltazar Najjar on 09/07/2023 10:48:29 [name] ([id number]) [id number] Page 3 of 4 -------------------------------------------------------------------------------- Progress Note Details Patient Name: Date of Service: [name], [name] 09/07/2023 1:30 PM Medical Record Number: [id number] Patient Account Number: [id number] Date of Birth/Sex: Treating RN: [birth year] (72 y.o. M) Primary Care Provider: Sanda Linger Other Clinician: Referring Provider: Treating Provider/Extender: Madelyn Flavors in Treatment: 6 Subjective History of Present Illness (HPI) 07/23/2023 [name] is a 72 year old male with a past medical history of controlled insulin-dependent type 2 diabetes and OSA That presents to the clinic for a 1-2- month history of nonhealing wound to the left knee. He states that he fell creating a wound. He has been following with his primary care office for this issue. He has been using Vaseline to the area. He has not taken any oral antibiotics for this issue. He currently denies signs of infection. Compared to pictures from when this first started  the wound is smaller. 8/26; patient presents for follow-up. He has been using Xeroform to the wound bed however patient states this has not been staying in place very well. He states he banged his knee today creating a blister to the wound site. 9/16; the patient's area which was initially traumatic on the left tibial tuberosity is completely healed. He is a diabetic but does not have a history of problematic wounds Objective Constitutional Patient is hypertensive.. Pulse regular and within target range for patient.Marland Kitchen Respirations regular, non-labored and within target range.. Temperature is normal and within the target range for the patient.Marland Kitchen Appears in no distress. Vitals Time Taken: 1:40 PM, Height: 70 in, Weight: 218 lbs, BMI: 31.3, Temperature: 98.2 F, Pulse: 65 bpm, Respiratory Rate: 18 breaths/min, Blood Pressure: 160/85 mmHg. General Notes: Wound exam; the area on the left tibial tuberosity is completely healed and epithelialized. No evidence of surrounding infection Integumentary (Hair, Skin) Wound #1 status is Open. Original cause of wound was Skin T ear/Laceration. The date acquired was: 06/19/2023. The wound has been in treatment 6 weeks. The wound is located on the Left,Anterior Knee. The wound measures 0cm length x 0cm width x 0cm depth; 0cm^2 area and 0cm^3 volume. There is no tunneling or undermining noted. There is a none present amount of drainage noted. The wound margin is distinct with the outline attached to the wound base. There is no granulation within the wound bed. There is no necrotic tissue within the wound bed. The periwound skin appearance exhibited: Scarring. The periwound skin appearance did not exhibit: Callus, Crepitus, Excoriation, Induration, Rash, Dry/Scaly, Maceration, Atrophie Blanche, Cyanosis, Ecchymosis, Hemosiderin Staining, Mottled, Pallor, Rubor, Erythema. Assessment Active Problems ICD-10 Non-pressure chronic ulcer of other part of left lower leg  limited to breakdown of skin Other early complications of trauma, initial encounter Type 2 diabetes mellitus with other skin ulcer Plan Discharge From [facility] Services: Discharge from Wound Care Center - Congratulations on your wound healing Bathing/ Shower/ Hygiene: May shower and wash wound with soap and water. Edema Control - Lymphedema / SCD / Other: Avoid standing for long periods of time. CHEEMENG, GAWNE ([id number]) [id number] Page 4 of 4 A dditional Orders / Instructions: Other: - diabetic shoes or Geri sleeves  to feet and legs protection of skin when skin tears. 1. The patient can be discharged. 2. He is a diabetic but does not have a wound care history the original wound was caused by an abrasion after a fall Electronic Signature(s) Signed: 09/07/2023 5:34:19 PM By: Baltazar Najjar MD Entered By: Baltazar Najjar on 09/07/2023 10:53:43 -------------------------------------------------------------------------------- SuperBill Details Patient Name: Date of Service: [name] 09/07/2023 Medical Record Number: [id number] Patient Account Number: [id number] Date of Birth/Sex: Treating RN: 13-Sep-1951 (72 y.o. M) Primary Care Provider: Sanda Linger Other Clinician: Referring Provider: Treating Provider/Extender: Madelyn Flavors in Treatment: 6 Diagnosis Coding ICD-10 Codes Code Description [phone] Non-pressure chronic ulcer of other part of left lower leg limited to breakdown of skin T79.8XXA Other early complications of trauma, initial encounter E11.622 Type 2 diabetes mellitus with other skin ulcer Facility Procedures : CPT4 Code: [id number] Description: 72536 - WOUND CARE VISIT-LEV 2 EST PT Modifier: Quantity: 1 Physician Procedures : CPT4 Code Description Modifier [id number] - WC PHYS LEVEL 2 - EST PT ICD-10 Diagnosis Description L97.821 Non-pressure chronic ulcer of other part of left lower leg limited to breakdown of  skin T79.8XXA Other early complications of trauma, initial  encounter E11.622 Type 2 diabetes mellitus with other skin ulcer Quantity: 1 Electronic Signature(s) Signed: 09/07/2023 5:06:21 PM By: Redmond Pulling RN, BSN Signed: 09/07/2023 5:34:19 PM By: Baltazar Najjar MD Entered By: Redmond Pulling on 09/07/2023 11:00:21

## 2023-09-11 NOTE — Progress Notes (Signed)
[name], [name] ([id number]) [id number] Page 1 of 7 Visit Report for 09/07/2023 Arrival Information Details Patient Name: Date of Service: [name], [name] 09/07/2023 1:30 PM Medical Record Number: [id number] Patient Account Number: [id number] Date of Birth/Sex: Treating RN: [birth year] (72 y.o. M) Primary Care [name]: [name] Other Clinician: Referring [name]: Treating [name]/Extender: [name] in Treatment: 6 Visit Information History Since Last Visit Added or deleted any medications: No Patient Arrived: [name] Any new allergies or adverse reactions: No Arrival Time: 13:34 Had a fall or experienced change in No Accompanied By: wife activities of daily living that may affect Transfer Assistance: None risk of falls: Patient Identification Verified: Yes Signs or symptoms of abuse/neglect since last visito No Secondary Verification Process Completed: Yes Hospitalized since last visit: No Patient Requires Transmission-Based Precautions: No Implantable device outside of the clinic excluding No Patient Has Alerts: No cellular tissue based products placed in the center since last visit: Has Dressing in Place as Prescribed: Yes Pain Present Now: No Electronic Signature(s) Signed: 09/11/2023 12:48:07 PM By: [name] Entered By: [name] on 09/07/2023 10:35:35 -------------------------------------------------------------------------------- Clinic Level of Care Assessment Details Patient Name: Date of Service: [name], [name] 09/07/2023 1:30 PM Medical Record Number: [id number] Patient Account Number: [id number] Date of Birth/Sex: Treating RN: 07-26-1951 (72 y.o. [name] Primary Care [name]: [name] Other Clinician: Referring [name]: Treating [name] in Treatment: 6 Clinic Level of Care Assessment Items TOOL 4 Quantity Score X- 1 0 Use when only  an EandM is performed on FOLLOW-UP visit ASSESSMENTS - Nursing Assessment / Reassessment X- 1 10 Reassessment of Co-morbidities (includes updates in patient status) X- 1 5 Reassessment of Adherence to Treatment Plan ASSESSMENTS - Wound and Skin A ssessment / Reassessment X - Simple Wound Assessment / Reassessment - one wound 1 5 []  - 0 Complex Wound Assessment / Reassessment - multiple wounds []  - 0 Dermatologic / Skin Assessment (not related to wound area) ASSESSMENTS - Focused Assessment X- 1 5 Circumferential Edema Measurements - multi extremities []  - 0 Nutritional Assessment / Counseling / Intervention [name], [name] ([id number]) [id number] Page 2 of 7 []  - 0 Lower Extremity Assessment (monofilament, tuning fork, pulses) []  - 0 Peripheral Arterial Disease Assessment (using hand held doppler) ASSESSMENTS - Ostomy and/or Continence Assessment and Care []  - 0 Incontinence Assessment and Management []  - 0 Ostomy Care Assessment and Management (repouching, etc.) PROCESS - Coordination of Care []  - 0 Simple Patient / Family Education for ongoing care []  - 0 Complex (extensive) Patient / Family Education for ongoing care X- 1 10 Staff obtains Chiropractor, Records, T Results / Process Orders est []  - 0 Staff telephones HHA, Nursing Homes / Clarify orders / etc []  - 0 Routine Transfer to another Facility (non-emergent condition) []  - 0 Routine Hospital Admission (non-emergent condition) []  - 0 New Admissions / Manufacturing engineer / Ordering NPWT Apligraf, etc. , []  - 0 Emergency Hospital Admission (emergent condition) X- 1 10 Simple Discharge Coordination []  - 0 Complex (extensive) Discharge Coordination PROCESS - Special Needs []  - 0 Pediatric / Minor Patient Management []  - 0 Isolation Patient Management []  - 0 Hearing / Language / Visual special needs []  - 0 Assessment of Community assistance (transportation, D/C planning, etc.) []   - 0 Additional assistance / Altered mentation []  - 0 Support Surface(s) Assessment (bed, cushion, seat, etc.) INTERVENTIONS - Wound Cleansing / Measurement X - Simple Wound Cleansing - one wound 1 5 []  -  0 Complex Wound Cleansing - multiple wounds X- 1 5 Wound Imaging (photographs - any number of wounds) []  - 0 Wound Tracing (instead of photographs) X- 1 5 Simple Wound Measurement - one wound []  - 0 Complex Wound Measurement - multiple wounds INTERVENTIONS - Wound Dressings []  - 0 Small Wound Dressing one or multiple wounds []  - 0 Medium Wound Dressing one or multiple wounds []  - 0 Large Wound Dressing one or multiple wounds []  - 0 Application of Medications - topical []  - 0 Application of Medications - injection INTERVENTIONS - Miscellaneous []  - 0 External ear exam []  - 0 Specimen Collection (cultures, biopsies, blood, body fluids, etc.) []  - 0 Specimen(s) / Culture(s) sent or taken to Lab for analysis []  - 0 Patient Transfer (multiple staff / Nurse, adult / Similar devices) []  - 0 Simple Staple / Suture removal (25 or less) []  - 0 Complex Staple / Suture removal (26 or more) []  - 0 Hypo / Hyperglycemic Management (close monitor of Blood Glucose) [name], [name] ([id number]) [id number] Page 3 of 7 []  - 0 Ankle / Brachial Index (ABI) - do not check if billed separately X- 1 5 Vital Signs Has the patient been seen at the hospital within the last three years: Yes Total Score: 65 Level Of Care: New/Established - Level 2 Electronic Signature(s) Signed: 09/07/2023 5:06:21 PM By: Elijah Pulling RN, BSN Entered By: [name] on 09/07/2023 10:59:53 -------------------------------------------------------------------------------- Encounter Discharge Information Details Patient Name: Date of Service: [name]. 09/07/2023 1:30 PM Medical Record Number: [id number] Patient Account Number: [id number] Date of Birth/Sex: Treating  RN: Sep 24, 1951 (72 y.o. [name] Primary Care [name]: [name] Other Clinician: Referring [name]: Treating [name]/Extender: [name] in Treatment: 6 Encounter Discharge Information Items Discharge Condition: Stable Ambulatory Status: Cane Discharge Destination: Home Transportation: Private Auto Accompanied By: wife Schedule Follow-up Appointment: Yes Clinical Summary of Care: Patient Declined Electronic Signature(s) Signed: 09/07/2023 5:06:21 PM By: Elijah Pulling RN, BSN Entered By: [name] on 09/07/2023 11:01:05 -------------------------------------------------------------------------------- Lower Extremity Assessment Details Patient Name: Date of Service: [name], [name] 09/07/2023 1:30 PM Medical Record Number: [id number] Patient Account Number: [id number] Date of Birth/Sex: Treating RN: 03-01-1951 (72 y.o. M) Primary Care Sonakshi Rolland: [name] Other Clinician: Referring Marquel Spoto: Treating Melford Tullier/Extender: [name] in Treatment: 6 Electronic Signature(s) Signed: 09/11/2023 12:48:07 PM By: [name] Entered By: [name] on 09/07/2023 10:40:17 -------------------------------------------------------------------------------- Multi Wound Chart Details Patient Name: Date of Service: [name]. 09/07/2023 1:30 PM Medical Record Number: [id number] Patient Account Number: [id number] [name], [name] ([id number]) [id number] Page 4 of 7 Date of Birth/Sex: Treating RN: July 29, 1951 (72 y.o. M) Primary Care Hiren Peplinski: Other Clinician: Sanda [name] Referring Daci Stubbe: Treating Nadya Hopwood/Extender: [name] in Treatment: 6 Vital Signs Height(in): 70 Pulse(bpm): 65 Weight(lbs): 218 Blood Pressure(mmHg): 160/85 Body Mass Index(BMI): 31.3 Temperature(F): 98.2 Respiratory Rate(breaths/min): 18 [1:Photos:] [N/A:N/A] Left, Anterior Knee  N/A N/A Wound Location: Skin T ear/Laceration N/A N/A Wounding Event: Skin T ear N/A N/A Primary Etiology: Cataracts, Glaucoma, Middle ear N/A N/A Comorbid History: problems, Anemia, Sleep Apnea, Congestive Heart Failure, Coronary Artery Disease, Hypertension, Type II Diabetes, Osteoarthritis, Neuropathy 06/19/2023 N/A N/A Date Acquired: 6 N/A N/A Weeks of Treatment: Open N/A N/A Wound Status: No N/A N/A Wound Recurrence: 0x0x0 N/A N/A Measurements L x W x D (cm) 0 N/A N/A A (cm) : rea 0 N/A N/A Volume (cm) : 100.00% N/A N/A % Reduction in A rea: 100.00%  [name], [name] ([id number]) [id number] Page 1 of 7 Visit Report for 09/07/2023 Arrival Information Details Patient Name: Date of Service: [name], [name] 09/07/2023 1:30 PM Medical Record Number: [id number] Patient Account Number: [id number] Date of Birth/Sex: Treating RN: [birth year] (72 y.o. M) Primary Care [name]: [name] Other Clinician: Referring [name]: Treating [name]/Extender: [name] in Treatment: 6 Visit Information History Since Last Visit Added or deleted any medications: No Patient Arrived: [name] Any new allergies or adverse reactions: No Arrival Time: 13:34 Had a fall or experienced change in No Accompanied By: wife activities of daily living that may affect Transfer Assistance: None risk of falls: Patient Identification Verified: Yes Signs or symptoms of abuse/neglect since last visito No Secondary Verification Process Completed: Yes Hospitalized since last visit: No Patient Requires Transmission-Based Precautions: No Implantable device outside of the clinic excluding No Patient Has Alerts: No cellular tissue based products placed in the center since last visit: Has Dressing in Place as Prescribed: Yes Pain Present Now: No Electronic Signature(s) Signed: 09/11/2023 12:48:07 PM By: [name] Entered By: [name] on 09/07/2023 10:35:35 -------------------------------------------------------------------------------- Clinic Level of Care Assessment Details Patient Name: Date of Service: [name], [name] 09/07/2023 1:30 PM Medical Record Number: [id number] Patient Account Number: [id number] Date of Birth/Sex: Treating RN: 07-26-1951 (72 y.o. [name] Primary Care [name]: [name] Other Clinician: Referring [name]: Treating [name] in Treatment: 6 Clinic Level of Care Assessment Items TOOL 4 Quantity Score X- 1 0 Use when only  an EandM is performed on FOLLOW-UP visit ASSESSMENTS - Nursing Assessment / Reassessment X- 1 10 Reassessment of Co-morbidities (includes updates in patient status) X- 1 5 Reassessment of Adherence to Treatment Plan ASSESSMENTS - Wound and Skin A ssessment / Reassessment X - Simple Wound Assessment / Reassessment - one wound 1 5 []  - 0 Complex Wound Assessment / Reassessment - multiple wounds []  - 0 Dermatologic / Skin Assessment (not related to wound area) ASSESSMENTS - Focused Assessment X- 1 5 Circumferential Edema Measurements - multi extremities []  - 0 Nutritional Assessment / Counseling / Intervention [name], [name] ([id number]) [id number] Page 2 of 7 []  - 0 Lower Extremity Assessment (monofilament, tuning fork, pulses) []  - 0 Peripheral Arterial Disease Assessment (using hand held doppler) ASSESSMENTS - Ostomy and/or Continence Assessment and Care []  - 0 Incontinence Assessment and Management []  - 0 Ostomy Care Assessment and Management (repouching, etc.) PROCESS - Coordination of Care []  - 0 Simple Patient / Family Education for ongoing care []  - 0 Complex (extensive) Patient / Family Education for ongoing care X- 1 10 Staff obtains Chiropractor, Records, T Results / Process Orders est []  - 0 Staff telephones HHA, Nursing Homes / Clarify orders / etc []  - 0 Routine Transfer to another Facility (non-emergent condition) []  - 0 Routine Hospital Admission (non-emergent condition) []  - 0 New Admissions / Manufacturing engineer / Ordering NPWT Apligraf, etc. , []  - 0 Emergency Hospital Admission (emergent condition) X- 1 10 Simple Discharge Coordination []  - 0 Complex (extensive) Discharge Coordination PROCESS - Special Needs []  - 0 Pediatric / Minor Patient Management []  - 0 Isolation Patient Management []  - 0 Hearing / Language / Visual special needs []  - 0 Assessment of Community assistance (transportation, D/C planning, etc.) []   - 0 Additional assistance / Altered mentation []  - 0 Support Surface(s) Assessment (bed, cushion, seat, etc.) INTERVENTIONS - Wound Cleansing / Measurement X - Simple Wound Cleansing - one wound 1 5 []  -  0 Complex Wound Cleansing - multiple wounds X- 1 5 Wound Imaging (photographs - any number of wounds) []  - 0 Wound Tracing (instead of photographs) X- 1 5 Simple Wound Measurement - one wound []  - 0 Complex Wound Measurement - multiple wounds INTERVENTIONS - Wound Dressings []  - 0 Small Wound Dressing one or multiple wounds []  - 0 Medium Wound Dressing one or multiple wounds []  - 0 Large Wound Dressing one or multiple wounds []  - 0 Application of Medications - topical []  - 0 Application of Medications - injection INTERVENTIONS - Miscellaneous []  - 0 External ear exam []  - 0 Specimen Collection (cultures, biopsies, blood, body fluids, etc.) []  - 0 Specimen(s) / Culture(s) sent or taken to Lab for analysis []  - 0 Patient Transfer (multiple staff / Nurse, adult / Similar devices) []  - 0 Simple Staple / Suture removal (25 or less) []  - 0 Complex Staple / Suture removal (26 or more) []  - 0 Hypo / Hyperglycemic Management (close monitor of Blood Glucose) [name], [name] ([id number]) [id number] Page 3 of 7 []  - 0 Ankle / Brachial Index (ABI) - do not check if billed separately X- 1 5 Vital Signs Has the patient been seen at the hospital within the last three years: Yes Total Score: 65 Level Of Care: New/Established - Level 2 Electronic Signature(s) Signed: 09/07/2023 5:06:21 PM By: Elijah Pulling RN, BSN Entered By: [name] on 09/07/2023 10:59:53 -------------------------------------------------------------------------------- Encounter Discharge Information Details Patient Name: Date of Service: [name]. 09/07/2023 1:30 PM Medical Record Number: [id number] Patient Account Number: [id number] Date of Birth/Sex: Treating  RN: Sep 24, 1951 (72 y.o. [name] Primary Care [name]: [name] Other Clinician: Referring [name]: Treating [name]/Extender: [name] in Treatment: 6 Encounter Discharge Information Items Discharge Condition: Stable Ambulatory Status: Cane Discharge Destination: Home Transportation: Private Auto Accompanied By: wife Schedule Follow-up Appointment: Yes Clinical Summary of Care: Patient Declined Electronic Signature(s) Signed: 09/07/2023 5:06:21 PM By: Elijah Pulling RN, BSN Entered By: [name] on 09/07/2023 11:01:05 -------------------------------------------------------------------------------- Lower Extremity Assessment Details Patient Name: Date of Service: [name], [name] 09/07/2023 1:30 PM Medical Record Number: [id number] Patient Account Number: [id number] Date of Birth/Sex: Treating RN: 03-01-1951 (72 y.o. M) Primary Care Sonakshi Rolland: [name] Other Clinician: Referring Marquel Spoto: Treating Melford Tullier/Extender: [name] in Treatment: 6 Electronic Signature(s) Signed: 09/11/2023 12:48:07 PM By: [name] Entered By: [name] on 09/07/2023 10:40:17 -------------------------------------------------------------------------------- Multi Wound Chart Details Patient Name: Date of Service: [name]. 09/07/2023 1:30 PM Medical Record Number: [id number] Patient Account Number: [id number] [name], [name] ([id number]) [id number] Page 4 of 7 Date of Birth/Sex: Treating RN: July 29, 1951 (72 y.o. M) Primary Care Hiren Peplinski: Other Clinician: Sanda [name] Referring Daci Stubbe: Treating Nadya Hopwood/Extender: [name] in Treatment: 6 Vital Signs Height(in): 70 Pulse(bpm): 65 Weight(lbs): 218 Blood Pressure(mmHg): 160/85 Body Mass Index(BMI): 31.3 Temperature(F): 98.2 Respiratory Rate(breaths/min): 18 [1:Photos:] [N/A:N/A] Left, Anterior Knee  N/A N/A Wound Location: Skin T ear/Laceration N/A N/A Wounding Event: Skin T ear N/A N/A Primary Etiology: Cataracts, Glaucoma, Middle ear N/A N/A Comorbid History: problems, Anemia, Sleep Apnea, Congestive Heart Failure, Coronary Artery Disease, Hypertension, Type II Diabetes, Osteoarthritis, Neuropathy 06/19/2023 N/A N/A Date Acquired: 6 N/A N/A Weeks of Treatment: Open N/A N/A Wound Status: No N/A N/A Wound Recurrence: 0x0x0 N/A N/A Measurements L x W x D (cm) 0 N/A N/A A (cm) : rea 0 N/A N/A Volume (cm) : 100.00% N/A N/A % Reduction in A rea: 100.00%

## 2023-09-13 DIAGNOSIS — E119 Type 2 diabetes mellitus without complications: Secondary | ICD-10-CM | POA: Diagnosis not present

## 2023-09-13 DIAGNOSIS — E118 Type 2 diabetes mellitus with unspecified complications: Secondary | ICD-10-CM | POA: Diagnosis not present

## 2023-09-19 DIAGNOSIS — G4733 Obstructive sleep apnea (adult) (pediatric): Secondary | ICD-10-CM | POA: Diagnosis not present

## 2023-09-21 DIAGNOSIS — G4733 Obstructive sleep apnea (adult) (pediatric): Secondary | ICD-10-CM | POA: Diagnosis not present

## 2023-10-19 DIAGNOSIS — G4733 Obstructive sleep apnea (adult) (pediatric): Secondary | ICD-10-CM | POA: Diagnosis not present

## 2023-10-21 ENCOUNTER — Other Ambulatory Visit: Payer: Self-pay | Admitting: Internal Medicine

## 2023-10-21 DIAGNOSIS — M961 Postlaminectomy syndrome, not elsewhere classified: Secondary | ICD-10-CM

## 2023-10-21 DIAGNOSIS — M5416 Radiculopathy, lumbar region: Secondary | ICD-10-CM

## 2023-10-21 DIAGNOSIS — E0849 Diabetes mellitus due to underlying condition with other diabetic neurological complication: Secondary | ICD-10-CM

## 2023-10-26 ENCOUNTER — Ambulatory Visit: Payer: Medicare PPO | Admitting: Internal Medicine

## 2023-10-26 ENCOUNTER — Encounter: Payer: Self-pay | Admitting: Internal Medicine

## 2023-10-26 VITALS — BP 160/90 | HR 83 | Temp 98.1°F | Ht 70.0 in | Wt 221.0 lb

## 2023-10-26 DIAGNOSIS — R0609 Other forms of dyspnea: Secondary | ICD-10-CM

## 2023-10-26 DIAGNOSIS — R072 Precordial pain: Secondary | ICD-10-CM | POA: Diagnosis not present

## 2023-10-26 DIAGNOSIS — D508 Other iron deficiency anemias: Secondary | ICD-10-CM

## 2023-10-26 DIAGNOSIS — G5601 Carpal tunnel syndrome, right upper limb: Secondary | ICD-10-CM | POA: Diagnosis not present

## 2023-10-26 DIAGNOSIS — D510 Vitamin B12 deficiency anemia due to intrinsic factor deficiency: Secondary | ICD-10-CM | POA: Diagnosis not present

## 2023-10-26 DIAGNOSIS — Z0001 Encounter for general adult medical examination with abnormal findings: Secondary | ICD-10-CM

## 2023-10-26 DIAGNOSIS — Z23 Encounter for immunization: Secondary | ICD-10-CM | POA: Diagnosis not present

## 2023-10-26 DIAGNOSIS — I5042 Chronic combined systolic (congestive) and diastolic (congestive) heart failure: Secondary | ICD-10-CM | POA: Diagnosis not present

## 2023-10-26 LAB — IBC + FERRITIN
Ferritin: 14.2 ng/mL — ABNORMAL LOW (ref 22.0–322.0)
Iron: 64 ug/dL (ref 42–165)
Saturation Ratios: 19 % — ABNORMAL LOW (ref 20.0–50.0)
TIBC: 337.4 ug/dL (ref 250.0–450.0)
Transferrin: 241 mg/dL (ref 212.0–360.0)

## 2023-10-26 LAB — CBC WITH DIFFERENTIAL/PLATELET
Basophils Absolute: 0 10*3/uL (ref 0.0–0.1)
Basophils Relative: 0.6 % (ref 0.0–3.0)
Eosinophils Absolute: 0.2 10*3/uL (ref 0.0–0.7)
Eosinophils Relative: 4 % (ref 0.0–5.0)
HCT: 41.6 % (ref 39.0–52.0)
Hemoglobin: 13.1 g/dL (ref 13.0–17.0)
Lymphocytes Relative: 38 % (ref 12.0–46.0)
Lymphs Abs: 1.9 10*3/uL (ref 0.7–4.0)
MCHC: 31.4 g/dL (ref 30.0–36.0)
MCV: 72.6 fL — ABNORMAL LOW (ref 78.0–100.0)
Monocytes Absolute: 0.5 10*3/uL (ref 0.1–1.0)
Monocytes Relative: 9.5 % (ref 3.0–12.0)
Neutro Abs: 2.5 10*3/uL (ref 1.4–7.7)
Neutrophils Relative %: 47.9 % (ref 43.0–77.0)
Platelets: 244 10*3/uL (ref 150.0–400.0)
RBC: 5.72 Mil/uL (ref 4.22–5.81)
RDW: 18.8 % — ABNORMAL HIGH (ref 11.5–15.5)
WBC: 5.1 10*3/uL (ref 4.0–10.5)

## 2023-10-26 LAB — TROPONIN I (HIGH SENSITIVITY): High Sens Troponin I: 5 ng/L (ref 2–17)

## 2023-10-26 LAB — BRAIN NATRIURETIC PEPTIDE: Pro B Natriuretic peptide (BNP): 16 pg/mL (ref 0.0–100.0)

## 2023-10-26 NOTE — Patient Instructions (Signed)
Health Maintenance, Male Adopting a healthy lifestyle and getting preventive care are important in promoting health and wellness. Ask your health care provider about: The right schedule for you to have regular tests and exams. Things you can do on your own to prevent diseases and keep yourself healthy. What should I know about diet, weight, and exercise? Eat a healthy diet  Eat a diet that includes plenty of vegetables, fruits, low-fat dairy products, and lean protein. Do not eat a lot of foods that are high in solid fats, added sugars, or sodium. Maintain a healthy weight Body mass index (BMI) is a measurement that can be used to identify possible weight problems. It estimates body fat based on height and weight. Your health care provider can help determine your BMI and help you achieve or maintain a healthy weight. Get regular exercise Get regular exercise. This is one of the most important things you can do for your health. Most adults should: Exercise for at least 150 minutes each week. The exercise should increase your heart rate and make you sweat (moderate-intensity exercise). Do strengthening exercises at least twice a week. This is in addition to the moderate-intensity exercise. Spend less time sitting. Even light physical activity can be beneficial. Watch cholesterol and blood lipids Have your blood tested for lipids and cholesterol at 74 years of age, then have this test every 5 years. You may need to have your cholesterol levels checked more often if: Your lipid or cholesterol levels are high. You are older than 74 years of age. You are at high risk for heart disease. What should I know about cancer screening? Many types of cancers can be detected early and may often be prevented. Depending on your health history and family history, you may need to have cancer screening at various ages. This may include screening for: Colorectal cancer. Prostate cancer. Skin cancer. Lung  cancer. What should I know about heart disease, diabetes, and high blood pressure? Blood pressure and heart disease High blood pressure causes heart disease and increases the risk of stroke. This is more likely to develop in people who have high blood pressure readings or are overweight. Talk with your health care provider about your target blood pressure readings. Have your blood pressure checked: Every 3-5 years if you are 18-39 years of age. Every year if you are 40 years old or older. If you are between the ages of 65 and 75 and are a current or former smoker, ask your health care provider if you should have a one-time screening for abdominal aortic aneurysm (AAA). Diabetes Have regular diabetes screenings. This checks your fasting blood sugar level. Have the screening done: Once every three years after age 45 if you are at a normal weight and have a low risk for diabetes. More often and at a younger age if you are overweight or have a high risk for diabetes. What should I know about preventing infection? Hepatitis B If you have a higher risk for hepatitis B, you should be screened for this virus. Talk with your health care provider to find out if you are at risk for hepatitis B infection. Hepatitis C Blood testing is recommended for: Everyone born from 1945 through 1965. Anyone with known risk factors for hepatitis C. Sexually transmitted infections (STIs) You should be screened each year for STIs, including gonorrhea and chlamydia, if: You are sexually active and are younger than 74 years of age. You are older than 74 years of age and your   health care provider tells you that you are at risk for this type of infection. Your sexual activity has changed since you were last screened, and you are at increased risk for chlamydia or gonorrhea. Ask your health care provider if you are at risk. Ask your health care provider about whether you are at high risk for HIV. Your health care provider  may recommend a prescription medicine to help prevent HIV infection. If you choose to take medicine to prevent HIV, you should first get tested for HIV. You should then be tested every 3 months for as long as you are taking the medicine. Follow these instructions at home: Alcohol use Do not drink alcohol if your health care provider tells you not to drink. If you drink alcohol: Limit how much you have to 0-2 drinks a day. Know how much alcohol is in your drink. In the [place] one drink equals one 12 oz bottle of beer (355 mL), one 5 oz glass of wine (148 mL), or one 1 oz glass of hard liquor (44 mL). Lifestyle Do not use any products that contain nicotine or tobacco. These products include cigarettes, chewing tobacco, and vaping devices, such as e-cigarettes. If you need help quitting, ask your health care provider. Do not use street drugs. Do not share needles. Ask your health care provider for help if you need support or information about quitting drugs. General instructions Schedule regular health, dental, and eye exams. Stay current with your vaccines. Tell your health care provider if: You often feel depressed. You have ever been abused or do not feel safe at home. Summary Adopting a healthy lifestyle and getting preventive care are important in promoting health and wellness. Follow your health care provider's instructions about healthy diet, exercising, and getting tested or screened for diseases. Follow your health care provider's instructions on monitoring your cholesterol and blood pressure. This information is not intended to replace advice given to you by your health care provider. Make sure you discuss any questions you have with your health care provider. Document Revised: 04/29/2021 Document Reviewed: 04/29/2021 [employer] Patient Education  2024 [employer]  

## 2023-10-26 NOTE — Progress Notes (Unsigned)
Subjective:  Patient ID: [name], male    DOB: 06-03-1951  Age: 72 y.o. MRN: [id number]  CC: Anemia, Hypertension, and Annual Exam   HPI ARMAS MCBEE presents for a CPX and f/up ----  Discussed the use of AI scribe software for clinical note transcription with the patient, who gave verbal consent to proceed.  History of Present Illness   The patient presents with a chief complaint of persistent sharp pain in the right hand and forearm, particularly when reaching down to pick up objects. The pain is described as sharp, sometimes accompanied by a sensation akin to pins and needles. The discomfort is noted to be worse at night, often disturbing sleep. The patient has not sought prior consultation for this specific issue, although he has a history of nerve damage in the legs and feet.  In addition to the hand and forearm pain, the patient reports episodes of shortness of breath and a burning sensation in the chest, particularly when walking fast. he has had these symptoms before, and a cardiac evaluation was reassuring. These symptoms tend to resolve upon resting. The patient has a history of hypertension, and during the consultation, his blood pressure was noted to be elevated. However, the patient attributes this to recent physical exertion and stress related to a scheduling mix-up.       Outpatient Medications Prior to Visit  Medication Sig Dispense Refill   ASPIRIN LOW DOSE 81 MG tablet TAKE 1 TABLET (81 MG TOTAL) BY MOUTH DAILY. SWALLOW WHOLE. 90 tablet 1   atorvastatin (LIPITOR) 80 MG tablet TAKE 1 TABLET BY MOUTH DAILY AT 6PM. 90 tablet 1   carvedilol (COREG) 12.5 MG tablet Take one (1) tablet by mouth ( 12.5 mg ) twice daily. 180 tablet 3   Cholecalciferol (VITAMIN D3) 25 MCG (1000 UT) CAPS Take by mouth.     Continuous Blood Gluc Receiver (FREESTYLE LIBRE 3 READER) DEVI 1 Act by Does not apply route daily. 1 each 3   Continuous Blood Gluc Sensor (FREESTYLE LIBRE 3 SENSOR) MISC 1  Act by Does not apply route daily. Place 1 sensor on the skin every 14 days. Use to check glucose continuously 2 each 5   empagliflozin (JARDIANCE) 25 MG TABS tablet TAKE 1 TABLET EVERY DAY 90 tablet 1   Evolocumab (REPATHA) 140 MG/ML SOSY INJECT 140MG  UNDER THE SKIN EVERY 2 WEEKS 6 mL 1   ferrous sulfate 325 (65 FE) MG tablet Take 1 tablet (325 mg total) by mouth daily with breakfast. 90 tablet 1   fluticasone (FLONASE) 50 MCG/ACT nasal spray SPRAY 2 SPRAYS INTO EACH NOSTRIL EVERY DAY 48 mL 1   gabapentin (NEURONTIN) 300 MG capsule Take 1 capsule (300 mg total) by mouth 3 (three) times daily. Take  capsule three times a day 270 capsule 1   Glucagon 1 MG/0.2ML SOAJ Inject 1 Act into the skin once as needed for up to 1 dose. 0.2 mL 3   icosapent Ethyl (VASCEPA) 1 g capsule Take 2 capsules (2 g total) by mouth 2 (two) times daily. 360 capsule 1   indapamide (LOZOL) 1.25 MG tablet Take 1.25 mg by mouth daily.     Insulin Pen Needle (BD PEN NEEDLE NANO 2ND GEN) 32G X 4 MM MISC Inject 1 Act into the skin daily. 100 each 3   isosorbide mononitrate (IMDUR) 120 MG 24 hr tablet Take 1 tablet (120 mg total) by mouth daily. 90 tablet 1   levocetirizine (XYZAL) 5 MG tablet  TAKE 1 TABLET BY MOUTH EVERY DAY IN THE EVENING 90 tablet 3   metFORMIN (GLUCOPHAGE) 500 MG tablet TAKE 1 TABLET TWICE DAILY WITH MEALS 180 tablet 1   nitroGLYCERIN (NITROSTAT) 0.4 MG SL tablet Place 1 tablet (0.4 mg total) under the tongue every 5 (five) minutes as needed for chest pain. Do not exceed total of 3 dose in 15 minutes. Please make overdue appt with Dr. Anne Fu before anymore refills. Thank you 1st attempt 25 tablet 0   pantoprazole (PROTONIX) 40 MG tablet TAKE 1 TABLET TWICE DAILY 180 tablet 1   ranolazine (RANEXA) 500 MG 12 hr tablet TAKE 1 TABLET TWICE DAILY 180 tablet 1   SOLIQUA 100-33 UNT-MCG/ML SOPN INJECT 60 UNITS INTO THE SKIN DAILY. 45 mL 1   tamsulosin (FLOMAX) 0.4 MG CAPS capsule Take 0.4 mg by mouth once.      traMADol (ULTRAM) 50 MG tablet TAKE 1 TABLET BY MOUTH EVERY 8 HOURS AS NEEDED 90 tablet 1   No facility-administered medications prior to visit.    ROS Review of Systems  Constitutional:  Negative for diaphoresis, fatigue and unexpected weight change.  HENT: Negative.    Eyes:  Positive for visual disturbance.  Respiratory:  Positive for apnea and shortness of breath. Negative for cough, chest tightness and wheezing.   Cardiovascular:  Positive for chest pain. Negative for palpitations and leg swelling.  Gastrointestinal:  Negative for abdominal pain, constipation, diarrhea and vomiting.  Genitourinary: Negative.  Negative for difficulty urinating.  Musculoskeletal:  Positive for arthralgias. Negative for back pain and myalgias.  Skin: Negative.   Neurological: Negative.   Hematological:  Negative for adenopathy. Does not bruise/bleed easily.  Psychiatric/Behavioral: Negative.      Objective:  BP (!) 160/90 (BP Location: Left Arm, Patient Position: Sitting, Cuff Size: Large)   Pulse 83   Temp 98.1 F (36.7 C) (Oral)   Ht 5\' 10"  (1.778 m)   Wt 221 lb (100.2 kg)   SpO2 96%   BMI 31.71 kg/m   BP Readings from Last 3 Encounters:  10/26/23 (!) 160/90  08/26/23 (!) 148/73  07/27/23 (!) 173/75    Wt Readings from Last 3 Encounters:  10/26/23 221 lb (100.2 kg)  08/26/23 220 lb (99.8 kg)  07/27/23 221 lb (100.2 kg)    Physical Exam Vitals reviewed.  Constitutional:      Appearance: Normal appearance.  HENT:     Mouth/Throat:     Mouth: Mucous membranes are moist.  Eyes:     General: No scleral icterus.    Conjunctiva/sclera: Conjunctivae normal.  Cardiovascular:     Rate and Rhythm: Normal rate and regular rhythm.     Heart sounds: Normal heart sounds, S1 normal and S2 normal.     No friction rub. No gallop.     Comments: EKG- NSR with 1st degree AV block, 69 bpm LAD, low voltage Anterior infarct pattern Unchanged Pulmonary:     Effort: Pulmonary effort is  normal.     Breath sounds: No stridor. No wheezing, rhonchi or rales.  Abdominal:     General: Abdomen is flat.     Palpations: There is no mass.     Tenderness: There is no abdominal tenderness. There is no guarding.     Hernia: No hernia is present.  Musculoskeletal:     Right elbow: Normal.     Left elbow: Normal.     Right forearm: Normal.     Left forearm: Normal.     Right hand:  Normal.     Left hand: Normal.     Cervical back: Neck supple.     Right lower leg: No edema.     Left lower leg: No edema.  Lymphadenopathy:     Cervical: No cervical adenopathy.  Skin:    General: Skin is warm and dry.     Findings: No rash.  Neurological:     General: No focal deficit present.     Mental Status: He is alert. Mental status is at baseline.  Psychiatric:        Mood and Affect: Mood normal.        Behavior: Behavior normal.     Lab Results  Component Value Date   WBC 5.1 10/26/2023   HGB 13.1 10/26/2023   HCT 41.6 10/26/2023   PLT 244.0 10/26/2023   GLUCOSE 168 (H) 06/23/2023   CHOL 111 06/23/2023   TRIG 131.0 06/23/2023   HDL 27.60 (L) 06/23/2023   LDLDIRECT 82.7 04/04/2013   LDLCALC 58 06/23/2023   ALT 12 06/23/2023   AST 12 06/23/2023   NA 136 06/23/2023   K 4.2 06/23/2023   CL 103 06/23/2023   CREATININE 1.27 06/23/2023   BUN 16 06/23/2023   CO2 25 06/23/2023   TSH 2.83 06/23/2023   PSA 3.81 06/23/2023   INR 0.95 08/17/2010   HGBA1C 6.9 (H) 06/23/2023   MICROALBUR 0.9 06/23/2023    VAS [place] ABI WITH/WO TBI  Result Date: 06/08/2023  LOWER EXTREMITY DOPPLER STUDY Patient Name:  DONATELLO KLEVE  Date of Exam:   06/08/2023 Medical Rec #: [id number]      Accession #:    [id number] Date of Birth: 12/24/1950      Patient Gender: M Patient Age:   53 years Exam Location:  [facility] Procedure:      VAS [place] ABI WITH/WO TBI Referring Phys: Geralynn Rile --------------------------------------------------------------------------------  Indications:  Claudication. High Risk Factors: Hypertension, Diabetes, no history of smoking, coronary                    artery disease. Other Factors: CHF, OSA, Diabetic Neuropathy, CKD stage 2.  Performing Technologist: Lowell Guitar RVT, RDMS  Examination Guidelines: A complete evaluation includes at minimum, Doppler waveform signals and systolic blood pressure reading at the level of bilateral brachial, anterior tibial, and posterior tibial arteries, when vessel segments are accessible. Bilateral testing is considered an integral part of a complete examination. Photoelectric Plethysmograph (PPG) waveforms and toe systolic pressure readings are included as required and additional duplex testing as needed. Limited examinations for reoccurring indications may be performed as noted.  ABI Findings: +---------+------------------+-----+-----------+--------+ Right    Rt Pressure (mmHg)IndexWaveform   Comment  +---------+------------------+-----+-----------+--------+ Brachial 160                                        +---------+------------------+-----+-----------+--------+ PTA      112               0.70 monophasic          +---------+------------------+-----+-----------+--------+ DP       110               0.69 multiphasic         +---------+------------------+-----+-----------+--------+ Great Toe104               0.65 Normal              +---------+------------------+-----+-----------+--------+ +---------+------------------+-----+-----------+-------+  Left     Lt Pressure (mmHg)IndexWaveform   Comment +---------+------------------+-----+-----------+-------+ Brachial 160                                       +---------+------------------+-----+-----------+-------+ PTA      156               0.98 monophasic         +---------+------------------+-----+-----------+-------+ DP       151               0.94 multiphasic        +---------+------------------+-----+-----------+-------+  Great Toe118               0.74 Normal             +---------+------------------+-----+-----------+-------+ +-------+-----------+-----------+------------+------------+ ABI/TBIToday's ABIToday's TBIPrevious ABIPrevious TBI +-------+-----------+-----------+------------+------------+ Right  0.70       0.65                                +-------+-----------+-----------+------------+------------+ Left   0.98       0.74                                +-------+-----------+-----------+------------+------------+  Arterial wall calcification precludes accurate ankle pressures and ABIs. PPG tracings display appropriate pulsatility.  Summary: Right: Resting right ankle-brachial index indicates moderate right lower extremity arterial disease. The right toe-brachial index is abnormal. Left: Resting left ankle-brachial index is within normal range. The left toe-brachial index is normal. *See table(s) above for measurements and observations.  Electronically signed by Coral Else MD on 06/08/2023 at 4:08:55 PM.    Final     Assessment & Plan:   Vitamin B12 deficiency anemia due to intrinsic factor deficiency -     CBC with Differential/Platelet; Future  Iron deficiency anemia secondary to inadequate dietary iron intake- H&H and iron levels are normal. -     IBC + Ferritin; Future -     CBC with Differential/Platelet; Future  Need for immunization against influenza -     Flu Vaccine Trivalent High Dose (Fluad)  Precordial pain- EKG and labs are reassuring. -     Brain natriuretic peptide; Future -     Troponin I (High Sensitivity); Future -     EKG 12-Lead  DOE (dyspnea on exertion)- EKG and labs are reassuring. -     Brain natriuretic peptide; Future -     Troponin I (High Sensitivity); Future  Encounter for general adult medical examination with abnormal findings- Exam completed, labs reviewed, vaccines reviewed and updated, cancer screenings are UTD, pt ed material was given.    Chronic combined systolic and diastolic congestive heart failure (HCC) -     Ambulatory referral to Cardiology  Carpal tunnel syndrome, right -     Ambulatory referral to Sports Medicine     Follow-up: Return in about 3 months (around 01/26/2024).  Sanda Linger, MD

## 2023-10-27 ENCOUNTER — Ambulatory Visit (INDEPENDENT_AMBULATORY_CARE_PROVIDER_SITE_OTHER): Payer: Medicare PPO | Admitting: Podiatry

## 2023-10-27 DIAGNOSIS — E1142 Type 2 diabetes mellitus with diabetic polyneuropathy: Secondary | ICD-10-CM

## 2023-10-27 DIAGNOSIS — B351 Tinea unguium: Secondary | ICD-10-CM | POA: Diagnosis not present

## 2023-10-27 DIAGNOSIS — M79609 Pain in unspecified limb: Secondary | ICD-10-CM | POA: Diagnosis not present

## 2023-10-30 ENCOUNTER — Encounter: Payer: Self-pay | Admitting: Podiatry

## 2023-10-30 NOTE — Progress Notes (Signed)
  Subjective:  Patient ID: [name], male    DOB: [birth year],  MRN: [id number]  72 y.o. male presents with at risk foot care with history of diabetic neuropathy and painful elongated mycotic toenails 1-5 bilaterally which are tender when wearing enclosed shoe gear. Pain is relieved with periodic professional debridement. He is accompanied by his wife on today's visit. Chief Complaint  Patient presents with   Diabetes    [facility] BS - DIDN'T CHECK IT A1C - YESTERDAY LVPCP - 10/26/23     PCP: Etta Grandchild, MD.  New problem(s): None.   Review of Systems: Negative except as noted in the HPI.   Allergies  Allergen Reactions   Furosemide     pancreatitis   Olmesartan Other (See Comments)    hyperkalemia   Amlodipine Swelling    LE EDEMA   Diamox [Acetazolamide] Itching    hives   Lisinopril Rash    Objective:  There were no vitals filed for this visit. Constitutional Patient is a pleasant 72 y.o. African American male obese in NAD. AAO x 3.  Vascular Capillary fill time to digits <3 seconds.  DP/PT pulse(s) are faintly palpable b/l lower extremities. Pedal hair absent b/l. Lower extremity skin temperature gradient warm to cool b/l. No pain with calf compression b/l. No cyanosis or clubbing noted. No ischemia nor gangrene noted b/l.   Neurologic Pt has subjective symptoms of neuropathy. Protective sensation diminished with 10g monofilament b/l. Vibratory sensation diminished b/l.  Dermatologic Pedal skin is thin, shiny and atrophic b/l.  No open wounds b/l lower extremities. No interdigital macerations b/l lower extremities. Toenails 1-5 b/l elongated, discolored, dystrophic, thickened, crumbly with subungual debris and tenderness to dorsal palpation. No corns, calluses nor porokeratotic lesions noted.  Orthopedic: Muscle strength 3/5 to all LE muscle groups of left lower extremity. Muscle strength 2/5 to all LE muscle groups of right lower extremity. Utilizes cane for ambulation  assistance.   Last HgA1c:     Latest Ref Rng & Units 06/23/2023    2:32 PM 01/20/2023   11:22 AM  Hemoglobin A1C  Hemoglobin-A1c 4.6 - 6.5 % 6.9  6.8      Assessment:   1. Pain due to onychomycosis of nail   2. Diabetic peripheral neuropathy associated with type 2 diabetes mellitus (HCC)    Plan:  Patient was evaluated and treated and all questions answered. Consent given for treatment as described below: -Continue foot and shoe inspections daily. Monitor blood glucose per PCP/Endocrinologist's recommendations. -Patient to continue soft, supportive shoe gear daily. -Toenails 1-5 b/l were debrided in length and girth with sterile nail nippers without iatrogenic bleeding. Patient declined use of dremel. -Patient/POA to call should there be question/concern in the interim.  Return in about 4 months (around 02/24/2024).  Freddie Breech, DPM

## 2023-11-07 ENCOUNTER — Other Ambulatory Visit: Payer: Self-pay | Admitting: Cardiology

## 2023-11-07 DIAGNOSIS — I5022 Chronic systolic (congestive) heart failure: Secondary | ICD-10-CM

## 2023-11-19 DIAGNOSIS — G4733 Obstructive sleep apnea (adult) (pediatric): Secondary | ICD-10-CM | POA: Diagnosis not present

## 2023-11-24 ENCOUNTER — Other Ambulatory Visit: Payer: Self-pay | Admitting: Internal Medicine

## 2023-11-24 DIAGNOSIS — E785 Hyperlipidemia, unspecified: Secondary | ICD-10-CM

## 2023-11-24 DIAGNOSIS — E118 Type 2 diabetes mellitus with unspecified complications: Secondary | ICD-10-CM

## 2023-11-24 DIAGNOSIS — E119 Type 2 diabetes mellitus without complications: Secondary | ICD-10-CM

## 2023-11-24 DIAGNOSIS — I2510 Atherosclerotic heart disease of native coronary artery without angina pectoris: Secondary | ICD-10-CM

## 2023-11-24 DIAGNOSIS — J301 Allergic rhinitis due to pollen: Secondary | ICD-10-CM

## 2023-11-24 DIAGNOSIS — I5042 Chronic combined systolic (congestive) and diastolic (congestive) heart failure: Secondary | ICD-10-CM

## 2023-11-27 NOTE — Progress Notes (Unsigned)
Cardiology Office Note:  .   Date:  11/30/2023  ID:  [name], DOB 26-Jun-1951, MRN [id number] PCP: Etta Grandchild, MD  [facility] Providers Cardiologist:  Donato Schultz, MD    Patient Profile: .      PMH Coronary artery disease  S/p BMS stenting of LCx Diabetes Polyneuropathy Hypertension Hyperlipidemia Obesity OSA Blindness  Cardiac catheterization in 2008 demonstrated circumflex disease for which he underwent PCI with bare-metal stent by Dr. Ty Hilts.  Right coronary artery was managed medically and not favorable for PCI.  August 2017 nuclear stress test showed no ischemia and he also had  low risk Myoview 04/2020.  Seen by me 06/25/2022 at which time he reported DOE and chest burning when walking fast for 2 to 4 weeks.  This felt like angina that occurred prior to his catheterization.  Lexiscan Myoview 07/10/2022 was low risk with no evidence of ischemia or infarction.  Echocardiogram 07/10/2022 revealed LVEF 65 to 70%, moderate asymmetric LVH, G1 DD, normal RV, no significant valve disease.   He was seen in follow-up by Dr. Anne Fu on 11/04/22 at which time he reported chest pain and SOB had improved. He attributed this to taking all of his medications regularly. He had both irbesartan and losartan on his list. He was only taking irbesartan but wanted to resume losartan instead. He was advised to return in 1 year for follow-up.        History of Present Illness: .   [name] is a very pleasant 72 y.o. male who is here today with his wife. He ambulates slowly with the assistance of a cane and our CMA. He reports occasional episodes of shortness of breath and chest pain, particularly with longer walks or rushing like coming in this morning for the appointment. Had a recent episode of leg swelling that has resolved. He denies weight gain, orthopnea, PND, palpitations, presyncope or syncope. Recently starting using CPAP for sleep apnea,uses it with naps during the day and at  night. He reports BP has been well controlled. He denies any bleeding problems or issues with his medications.   Discussed the use of AI scribe software for clinical note transcription with the patient, who gave verbal consent to proceed.   ROS: See HPI       Studies Reviewed: .         Risk Assessment/Calculations:              Physical Exam:   VS:  BP 132/78   Pulse 65   Ht 5\' 10"  (1.778 m)   Wt 218 lb 12.8 oz (99.2 kg)   SpO2 98%   BMI 31.39 kg/m    Wt Readings from Last 3 Encounters:  11/30/23 218 lb 12.8 oz (99.2 kg)  10/26/23 221 lb (100.2 kg)  08/26/23 220 lb (99.8 kg)    GEN: Well nourished, well developed in no acute distress NECK: No JVD; No carotid bruits CARDIAC: RRR, no murmurs, rubs, gallops RESPIRATORY:  Clear to auscultation without rales, wheezing or rhonchi  ABDOMEN: Soft, non-tender, non-distended EXTREMITIES:  No edema; No deformity     ASSESSMENT AND PLAN: .    CAD with stable angina: We reviewed medications in detail which PCP has recently refilled with the exception of SL NTG and carvedilol. Encouraged him to keep SL NTG on hand for as needed use. He reports no recent episodes of concerning cardiac symptoms. He denies chest pain, dyspnea, or other symptoms concerning for angina.  No  indication for further ischemic evaluation at this time.  He denies bleeding concerns. We will continue GDMT including atorvastatin, carvedilol, Repatha, Imdur, Vascepa, aspirin.  Encouraged heart healthy diet limiting saturated fat, processed foods, sodium, and sugar.  Chronic HFpEF: Echo 07/10/2022 with normal LVEF 65-70%, grade 1 diastolic dysfunction, moderate LVH of the basal septal segment, normal RV.  He has chronic shortness of breath that occurs when walking farther distance or with rushing. Reports occasional LE edema. He denies dyspnea, orthopnea, PND.  Appears euvolemic on exam. Weight is stable. He is on multiple medications with high risk for polypharmacy. He is  overall feeling well, therefore no indication to start diuretic therapy at this time.  Continue carvedilol, Jardiance.   Hypertension: BP is well controlled today. He reports BP has been well controlled at home.  Continue current antihypertensive regimen.  Type 2 DM: A1C 6.9% on 06/23/23. Management per PCP.   Hyperlipidemia LDL goal < 70: Lipid panel 06/23/23 with total cholesterol 111, triglycerides 131, LDL-C 58 and HDL 27.6.  Cholesterol is at goal on Repatha and Vascepa. He denies problems with medications. Focus on heart healthy mostly plant based diet avoiding saturated fat, processed foods, simple carbohydrates, and sugar along with regular physical activity.    OSA: He reports compliance with CPAP.  No concerning symptoms at this time.        Dispo: 6 months with Dr. Anne Fu or me  Signed, Eligha Bridegroom, NP-C

## 2023-11-28 ENCOUNTER — Other Ambulatory Visit: Payer: Self-pay | Admitting: Internal Medicine

## 2023-11-28 DIAGNOSIS — D508 Other iron deficiency anemias: Secondary | ICD-10-CM

## 2023-11-30 ENCOUNTER — Ambulatory Visit: Payer: Medicare PPO | Attending: Nurse Practitioner | Admitting: Nurse Practitioner

## 2023-11-30 ENCOUNTER — Encounter: Payer: Self-pay | Admitting: Nurse Practitioner

## 2023-11-30 ENCOUNTER — Other Ambulatory Visit: Payer: Self-pay | Admitting: Cardiology

## 2023-11-30 VITALS — BP 132/78 | HR 65 | Ht 70.0 in | Wt 218.8 lb

## 2023-11-30 DIAGNOSIS — I10 Essential (primary) hypertension: Secondary | ICD-10-CM | POA: Diagnosis not present

## 2023-11-30 DIAGNOSIS — E785 Hyperlipidemia, unspecified: Secondary | ICD-10-CM | POA: Diagnosis not present

## 2023-11-30 DIAGNOSIS — I5032 Chronic diastolic (congestive) heart failure: Secondary | ICD-10-CM | POA: Diagnosis not present

## 2023-11-30 DIAGNOSIS — G4733 Obstructive sleep apnea (adult) (pediatric): Secondary | ICD-10-CM | POA: Diagnosis not present

## 2023-11-30 DIAGNOSIS — I2510 Atherosclerotic heart disease of native coronary artery without angina pectoris: Secondary | ICD-10-CM | POA: Diagnosis not present

## 2023-11-30 DIAGNOSIS — I5022 Chronic systolic (congestive) heart failure: Secondary | ICD-10-CM

## 2023-11-30 MED ORDER — CARVEDILOL 12.5 MG PO TABS: ORAL_TABLET | ORAL | 3 refills | Status: DC

## 2023-11-30 MED ORDER — NITROGLYCERIN 0.4 MG SL SUBL: 0.4000 mg | SUBLINGUAL_TABLET | SUBLINGUAL | 3 refills | Status: DC | PRN

## 2023-11-30 NOTE — Patient Instructions (Signed)
Medication Instructions:   Your physician recommends that you continue on your current medications as directed. Please refer to the Current Medication list given to you today.   *If you need a refill on your cardiac medications before your next appointment, please call your pharmacy*   Lab Work:  None ordered.  If you have labs (blood work) drawn today and your tests are completely normal, you will receive your results only by: [facility] (if you have MyChart) OR A paper copy in the mail If you have any lab test that is abnormal or we need to change your treatment, we will call you to review the results.   Testing/Procedures:  None ordered.   Follow-Up: At [facility], you and your health needs are our priority.  As part of our continuing mission to provide you with exceptional heart care, we have created designated Provider Care Teams.  These Care Teams include your primary Cardiologist (physician) and Advanced Practice Providers (APPs -  Physician Assistants and Nurse Practitioners) who all work together to provide you with the care you need, when you need it.  We recommend signing up for the patient portal called "MyChart".  Sign up information is provided on this After Visit Summary.  MyChart is used to connect with patients for Virtual Visits (Telemedicine).  Patients are able to view lab/test results, encounter notes, upcoming appointments, etc.  Non-urgent messages can be sent to your provider as well.   To learn more about what you can do with MyChart, go to [email or web address].    Your next appointment:   6 month(s)  Provider:   Candee Furbish, MD

## 2023-12-12 DIAGNOSIS — E119 Type 2 diabetes mellitus without complications: Secondary | ICD-10-CM | POA: Diagnosis not present

## 2023-12-12 DIAGNOSIS — E118 Type 2 diabetes mellitus with unspecified complications: Secondary | ICD-10-CM | POA: Diagnosis not present

## 2023-12-17 DIAGNOSIS — G4733 Obstructive sleep apnea (adult) (pediatric): Secondary | ICD-10-CM | POA: Diagnosis not present

## 2023-12-19 DIAGNOSIS — G4733 Obstructive sleep apnea (adult) (pediatric): Secondary | ICD-10-CM | POA: Diagnosis not present

## 2024-01-19 DIAGNOSIS — G4733 Obstructive sleep apnea (adult) (pediatric): Secondary | ICD-10-CM | POA: Diagnosis not present

## 2024-01-28 ENCOUNTER — Telehealth: Payer: Self-pay | Admitting: Internal Medicine

## 2024-01-28 ENCOUNTER — Other Ambulatory Visit: Payer: Self-pay | Admitting: Internal Medicine

## 2024-01-28 DIAGNOSIS — M5416 Radiculopathy, lumbar region: Secondary | ICD-10-CM

## 2024-01-28 DIAGNOSIS — E0849 Diabetes mellitus due to underlying condition with other diabetic neurological complication: Secondary | ICD-10-CM

## 2024-01-28 DIAGNOSIS — M961 Postlaminectomy syndrome, not elsewhere classified: Secondary | ICD-10-CM

## 2024-01-28 MED ORDER — TRAMADOL HCL 50 MG PO TABS: 50.0000 mg | ORAL_TABLET | Freq: Three times a day (TID) | ORAL | 1 refills | Status: DC | PRN

## 2024-01-28 NOTE — Telephone Encounter (Signed)
 Copied from CRM [id number]. Topic: Clinical - Medication Refill >> Jan 28, 2024  9:44 AM Jinnie BROCKS wrote: Most Recent Primary Care Visit:  Provider: JOSHUA DEBBY CROME  Department: [facility]  Visit Type: OFFICE VISIT  Date: 10/26/2023  Medication: traMADol  (ULTRAM )  Has the patient contacted their pharmacy? Yes (Agent: If no, request that the patient contact the pharmacy for the refill. If patient does not wish to contact the pharmacy document the reason why and proceed with request.) (Agent: If yes, when and what did the pharmacy advise?)  Is this the correct pharmacy for this prescription? Yes If no, delete pharmacy and type the correct one.  This is the patient's preferred pharmacy:  [facility] - [place], [place] - [address] [place] [address] Phone: [phone] Fax: [phone]  [facility] [phone] - [place], [place] - [address] AT [facility] [address] [place] [address] Phone: [phone] Fax: [phone]  [facility] [address] [place] - [address] [place] [address] Phone: [phone] Fax: [phone]  [facility] [place] - [phone] [place] [address] Phone: [phone] Fax: [phone]   Has the prescription been filled recently? Yes  Is the patient out of the medication? Yes  Has the patient been seen for an appointment in the last year OR does the patient have an upcoming appointment? Yes  Can we respond through MyChart? Yes  Agent: Please be advised that Rx refills may take up to 3 business days. We ask that you follow-up with your pharmacy.

## 2024-01-30 ENCOUNTER — Other Ambulatory Visit: Payer: Self-pay | Admitting: Internal Medicine

## 2024-01-30 DIAGNOSIS — J301 Allergic rhinitis due to pollen: Secondary | ICD-10-CM

## 2024-02-03 NOTE — Telephone Encounter (Signed)
Copied from CRM [id number]. Topic: Clinical - Prescription Issue >> Feb 03, 2024  1:46 PM Pascal Lux wrote: Reason for CRM: Patient wife called regarding patient prescription traMADol (ULTRAM) 50 MG tablet [[id number]] - stated that she requested medication to be sent to [id number] - [place], [place] - [address] - [phone]  -- Stated patient has been going without and would like clarity on the status.

## 2024-02-04 DIAGNOSIS — Z4420 Encounter for fitting and adjustment of artificial eye, unspecified: Secondary | ICD-10-CM | POA: Diagnosis not present

## 2024-02-04 NOTE — Telephone Encounter (Signed)
Patient and his wife has been made aware that his medication was refilled. Also have made updates to his pharmacy

## 2024-02-19 ENCOUNTER — Other Ambulatory Visit: Payer: Self-pay | Admitting: Internal Medicine

## 2024-02-19 DIAGNOSIS — G4733 Obstructive sleep apnea (adult) (pediatric): Secondary | ICD-10-CM | POA: Diagnosis not present

## 2024-02-19 DIAGNOSIS — K219 Gastro-esophageal reflux disease without esophagitis: Secondary | ICD-10-CM

## 2024-02-19 DIAGNOSIS — E118 Type 2 diabetes mellitus with unspecified complications: Secondary | ICD-10-CM

## 2024-02-20 DIAGNOSIS — G4733 Obstructive sleep apnea (adult) (pediatric): Secondary | ICD-10-CM | POA: Diagnosis not present

## 2024-02-24 ENCOUNTER — Ambulatory Visit: Payer: Medicare Other | Admitting: Podiatry

## 2024-03-02 ENCOUNTER — Other Ambulatory Visit: Payer: Self-pay | Admitting: Internal Medicine

## 2024-03-03 ENCOUNTER — Ambulatory Visit: Payer: Medicare Other | Admitting: Internal Medicine

## 2024-03-03 ENCOUNTER — Encounter: Payer: Self-pay | Admitting: Internal Medicine

## 2024-03-03 VITALS — BP 132/82 | HR 65 | Temp 98.0°F | Ht 70.0 in | Wt 221.0 lb

## 2024-03-03 DIAGNOSIS — E785 Hyperlipidemia, unspecified: Secondary | ICD-10-CM | POA: Diagnosis not present

## 2024-03-03 DIAGNOSIS — E119 Type 2 diabetes mellitus without complications: Secondary | ICD-10-CM

## 2024-03-03 DIAGNOSIS — N401 Enlarged prostate with lower urinary tract symptoms: Secondary | ICD-10-CM | POA: Diagnosis not present

## 2024-03-03 DIAGNOSIS — I10 Essential (primary) hypertension: Secondary | ICD-10-CM

## 2024-03-03 DIAGNOSIS — I5042 Chronic combined systolic (congestive) and diastolic (congestive) heart failure: Secondary | ICD-10-CM | POA: Diagnosis not present

## 2024-03-03 DIAGNOSIS — E0849 Diabetes mellitus due to underlying condition with other diabetic neurological complication: Secondary | ICD-10-CM

## 2024-03-03 DIAGNOSIS — R3912 Poor urinary stream: Secondary | ICD-10-CM

## 2024-03-03 DIAGNOSIS — D510 Vitamin B12 deficiency anemia due to intrinsic factor deficiency: Secondary | ICD-10-CM

## 2024-03-03 DIAGNOSIS — Z794 Long term (current) use of insulin: Secondary | ICD-10-CM

## 2024-03-03 DIAGNOSIS — M961 Postlaminectomy syndrome, not elsewhere classified: Secondary | ICD-10-CM | POA: Diagnosis not present

## 2024-03-03 DIAGNOSIS — M5416 Radiculopathy, lumbar region: Secondary | ICD-10-CM

## 2024-03-03 DIAGNOSIS — E781 Pure hyperglyceridemia: Secondary | ICD-10-CM | POA: Diagnosis not present

## 2024-03-03 DIAGNOSIS — N182 Chronic kidney disease, stage 2 (mild): Secondary | ICD-10-CM

## 2024-03-03 DIAGNOSIS — D508 Other iron deficiency anemias: Secondary | ICD-10-CM

## 2024-03-03 LAB — CBC WITH DIFFERENTIAL/PLATELET
Basophils Absolute: 0 10*3/uL (ref 0.0–0.1)
Basophils Relative: 0.6 % (ref 0.0–3.0)
Eosinophils Absolute: 0.3 10*3/uL (ref 0.0–0.7)
Eosinophils Relative: 5.1 % — ABNORMAL HIGH (ref 0.0–5.0)
HCT: 41.7 % (ref 39.0–52.0)
Hemoglobin: 13.1 g/dL (ref 13.0–17.0)
Lymphocytes Relative: 37.9 % (ref 12.0–46.0)
Lymphs Abs: 2.5 10*3/uL (ref 0.7–4.0)
MCHC: 31.5 g/dL (ref 30.0–36.0)
MCV: 74.5 fl — ABNORMAL LOW (ref 78.0–100.0)
Monocytes Absolute: 0.5 10*3/uL (ref 0.1–1.0)
Monocytes Relative: 7.6 % (ref 3.0–12.0)
Neutro Abs: 3.2 10*3/uL (ref 1.4–7.7)
Neutrophils Relative %: 48.8 % (ref 43.0–77.0)
Platelets: 226 10*3/uL (ref 150.0–400.0)
RBC: 5.6 Mil/uL (ref 4.22–5.81)
RDW: 15.8 % — ABNORMAL HIGH (ref 11.5–15.5)
WBC: 6.6 10*3/uL (ref 4.0–10.5)

## 2024-03-03 LAB — BASIC METABOLIC PANEL
BUN: 13 mg/dL (ref 6–23)
CO2: 30 meq/L (ref 19–32)
Calcium: 8.8 mg/dL (ref 8.4–10.5)
Chloride: 102 meq/L (ref 96–112)
Creatinine, Ser: 1.04 mg/dL (ref 0.40–1.50)
GFR: 70.72 mL/min (ref >60.00)
Glucose, Bld: 80 mg/dL (ref 70–99)
Potassium: 4.6 meq/L (ref 3.5–5.1)
Sodium: 139 meq/L (ref 135–145)

## 2024-03-03 LAB — FOLATE: Folate: 13.9 ng/mL (ref >5.9)

## 2024-03-03 LAB — PSA: PSA: 3.71 ng/mL (ref 0.10–4.00)

## 2024-03-03 LAB — IBC + FERRITIN
Ferritin: 46.5 ng/mL (ref 22.0–322.0)
Iron: 79 ug/dL (ref 42–165)
Saturation Ratios: 27 % (ref 20.0–50.0)
TIBC: 292.6 ug/dL (ref 250.0–450.0)
Transferrin: 209 mg/dL — ABNORMAL LOW (ref 212.0–360.0)

## 2024-03-03 LAB — LIPID PANEL
Cholesterol: 124 mg/dL (ref 0–200)
HDL: 30.6 mg/dL — ABNORMAL LOW (ref >39.00)
LDL Cholesterol: 67 mg/dL (ref 0–99)
NonHDL: 93.89
Total CHOL/HDL Ratio: 4
Triglycerides: 135 mg/dL (ref 0.0–149.0)
VLDL: 27 mg/dL (ref 0.0–40.0)

## 2024-03-03 LAB — HEPATIC FUNCTION PANEL
ALT: 16 U/L (ref 0–53)
AST: 16 U/L (ref 0–37)
Albumin: 4.1 g/dL (ref 3.5–5.2)
Alkaline Phosphatase: 52 U/L (ref 39–117)
Bilirubin, Direct: 0.1 mg/dL (ref 0.0–0.3)
Total Bilirubin: 0.5 mg/dL (ref 0.2–1.2)
Total Protein: 6.8 g/dL (ref 6.0–8.3)

## 2024-03-03 LAB — MICROALBUMIN / CREATININE URINE RATIO
Creatinine,U: 89.5 mg/dL
Microalb Creat Ratio: 15.6 mg/g (ref 0.0–30.0)
Microalb, Ur: 1.4 mg/dL (ref 0.0–1.9)

## 2024-03-03 LAB — VITAMIN B12: Vitamin B-12: 239 pg/mL (ref 211–911)

## 2024-03-03 LAB — HEMOGLOBIN A1C: Hgb A1c MFr Bld: 6.4 % (ref 4.6–6.5)

## 2024-03-03 MED ORDER — TRAMADOL HCL 50 MG PO TABS
50.0000 mg | ORAL_TABLET | Freq: Two times a day (BID) | ORAL | 0 refills | Status: DC | PRN
Start: 2024-03-03 — End: 2024-04-15

## 2024-03-03 NOTE — Patient Instructions (Signed)

## 2024-03-03 NOTE — Progress Notes (Signed)
 Subjective:  Patient ID: [name], male    DOB: [birth year]  Age: 73 y.o. MRN: [id number]  CC: Anemia, Hypertension, Hyperlipidemia, and Diabetes   HPI [name] presents for f/up ---  Discussed the use of AI scribe software for clinical note transcription with the patient, who gave verbal consent to proceed.  History of Present Illness   The patient presents for a routine follow-up visit.  No recent chest pain or shortness of breath. He has been feeling 'pretty fine.'  Two months ago, he experienced vertigo, which led to a couple of falls. The vertigo has since resolved, and he has not experienced any recent falls.  No recent trouble swallowing, painful swallowing, or urination issues. His medication, Flomax, has been effective in managing his urinary symptoms. No straining, nocturia, or difficulty urinating.  No recent swelling in his legs or feet. No awareness of bleeding or bruising, although he notes occasional bumps into objects.       Outpatient Medications Prior to Visit  Medication Sig Dispense Refill   ASPIRIN LOW DOSE 81 MG tablet TAKE 1 TABLET (81 MG TOTAL) BY MOUTH DAILY. SWALLOW WHOLE. 90 tablet 1   atorvastatin (LIPITOR) 80 MG tablet TAKE 1 TABLET BY MOUTH DAILY AT 6PM. 90 tablet 1   carvedilol (COREG) 12.5 MG tablet Take one (1) tablet by mouth ( 12.5 mg) twice daily. 180 tablet 3   Cholecalciferol (VITAMIN D3) 25 MCG (1000 UT) CAPS Take by mouth.     Continuous Blood Gluc Receiver (FREESTYLE LIBRE 3 READER) DEVI 1 Act by Does not apply route daily. 1 each 3   Continuous Blood Gluc Sensor (FREESTYLE LIBRE 3 SENSOR) MISC 1 Act by Does not apply route daily. Place 1 sensor on the skin every 14 days. Use to check glucose continuously 2 each 5   empagliflozin (JARDIANCE) 25 MG TABS tablet TAKE 1 TABLET EVERY DAY 90 tablet 1   Evolocumab (REPATHA) 140 MG/ML SOSY INJECT 140MG  UNDER THE SKIN EVERY 2 WEEKS 6 mL 1   ferrous sulfate 325 (65 FE) MG tablet TAKE 1  TABLET BY MOUTH EVERY DAY WITH BREAKFAST 100 tablet 1   fluticasone (FLONASE) 50 MCG/ACT nasal spray SPRAY 2 SPRAYS INTO EACH NOSTRIL EVERY DAY 48 mL 1   gabapentin (NEURONTIN) 300 MG capsule Take 1 capsule (300 mg total) by mouth 3 (three) times daily. Take  capsule three times a day 270 capsule 1   Glucagon 1 MG/0.2ML SOAJ Inject 1 Act into the skin once as needed for up to 1 dose. 0.2 mL 3   indapamide (LOZOL) 1.25 MG tablet Take 1.25 mg by mouth daily.     Insulin Pen Needle (DROPLET PEN NEEDLES) 32G X 4 MM MISC USE EVERY DAY 100 each 1   isosorbide mononitrate (IMDUR) 120 MG 24 hr tablet Take 1 tablet (120 mg total) by mouth daily. 90 tablet 1   levocetirizine (XYZAL) 5 MG tablet TAKE 1 TABLET EVERY DAY IN THE EVENING 90 tablet 1   metFORMIN (GLUCOPHAGE) 500 MG tablet TAKE 1 TABLET BY MOUTH TWICE A DAY WITH FOOD 180 tablet 1   nitroGLYCERIN (NITROSTAT) 0.4 MG SL tablet Place 1 tablet (0.4 mg total) under the tongue every 5 (five) minutes as needed for chest pain. Do not exceed total of 3 dose in 15 minutes. 25 tablet 3   pantoprazole (PROTONIX) 40 MG tablet TAKE 1 TABLET BY MOUTH TWICE A DAY 180 tablet 1   ranolazine (RANEXA) 500  MG 12 hr tablet TAKE 1 TABLET TWICE DAILY 180 tablet 1   SOLIQUA 100-33 UNT-MCG/ML SOPN INJECT 60 UNITS INTO THE SKIN DAILY. 45 mL 1   tamsulosin (FLOMAX) 0.4 MG CAPS capsule Take 0.4 mg by mouth once.     VASCEPA 1 g capsule TAKE 2 CAPSULES BY MOUTH 2 TIMES DAILY. 360 capsule 1   traMADol (ULTRAM) 50 MG tablet Take 1 tablet (50 mg total) by mouth every 8 (eight) hours as needed. 90 tablet 1   No facility-administered medications prior to visit.    ROS Review of Systems  Constitutional:  Negative for appetite change, diaphoresis, fatigue and unexpected weight change.  HENT: Negative.    Respiratory: Negative.  Negative for cough, chest tightness, shortness of breath and wheezing.   Cardiovascular:  Negative for chest pain, palpitations and leg swelling.   Gastrointestinal: Negative.  Negative for abdominal pain, diarrhea, nausea and vomiting.  Genitourinary:  Positive for difficulty urinating. Negative for dysuria, frequency, hematuria, scrotal swelling and testicular pain.  Musculoskeletal:  Positive for arthralgias and back pain. Negative for myalgias.  Skin: Negative.   Neurological: Negative.  Negative for dizziness and weakness.  Hematological:  Negative for adenopathy. Does not bruise/bleed easily.  Psychiatric/Behavioral: Negative.      Objective:  BP 132/82 (BP Location: Left Arm, Patient Position: Sitting, Cuff Size: Normal)   Pulse 65   Temp 98 F (36.7 C) (Oral)   Ht 5\' 10"  (1.778 m)   Wt 221 lb (100.2 kg)   SpO2 98%   BMI 31.71 kg/m   BP Readings from Last 3 Encounters:  03/03/24 132/82  11/30/23 132/78  10/26/23 (!) 160/90    Wt Readings from Last 3 Encounters:  03/03/24 221 lb (100.2 kg)  11/30/23 218 lb 12.8 oz (99.2 kg)  10/26/23 221 lb (100.2 kg)    Physical Exam Vitals reviewed.  Constitutional:      General: He is not in acute distress.    Appearance: He is not toxic-appearing or diaphoretic.  HENT:     Mouth/Throat:     Mouth: Mucous membranes are moist.  Eyes:     General: No scleral icterus.    Conjunctiva/sclera: Conjunctivae normal.  Cardiovascular:     Rate and Rhythm: Normal rate and regular rhythm.     Heart sounds: No murmur heard.    No friction rub. No gallop.  Pulmonary:     Effort: Pulmonary effort is normal.     Breath sounds: No stridor. No wheezing, rhonchi or rales.  Abdominal:     General: Abdomen is flat.     Palpations: There is no mass.     Tenderness: There is no abdominal tenderness. There is no guarding.     Hernia: No hernia is present.  Musculoskeletal:        General: Normal range of motion.     Cervical back: Neck supple.     Right lower leg: No edema.     Left lower leg: No edema.  Lymphadenopathy:     Cervical: No cervical adenopathy.  Skin:    General:  Skin is warm and dry.  Neurological:     General: No focal deficit present.     Mental Status: He is alert. Mental status is at baseline.  Psychiatric:        Behavior: Behavior normal.     Lab Results  Component Value Date   WBC 6.6 03/03/2024   HGB 13.1 03/03/2024   HCT 41.7 03/03/2024   PLT  226.0 03/03/2024   GLUCOSE 80 03/03/2024   CHOL 124 03/03/2024   TRIG 135.0 03/03/2024   HDL 30.60 (L) 03/03/2024   LDLDIRECT 82.7 04/04/2013   LDLCALC 67 03/03/2024   ALT 16 03/03/2024   AST 16 03/03/2024   NA 139 03/03/2024   K 4.6 03/03/2024   CL 102 03/03/2024   CREATININE 1.04 03/03/2024   BUN 13 03/03/2024   CO2 30 03/03/2024   TSH 2.83 06/23/2023   PSA 3.71 03/03/2024   INR 0.95 08/17/2010   HGBA1C 6.4 03/03/2024   MICROALBUR 1.4 03/03/2024    VAS [place] ABI WITH/WO TBI Result Date: 06/08/2023  LOWER EXTREMITY DOPPLER STUDY Patient Name:  GENEVIEVE RITZEL  Date of Exam:   06/08/2023 Medical Rec #: [id number]      Accession #:    [id number] Date of Birth: [birth year]      Patient Gender: M Patient Age:   25 years Exam Location:  [facility] Procedure:      VAS [place] ABI WITH/WO TBI Referring Phys: Geralynn Rile --------------------------------------------------------------------------------  Indications: Claudication. High Risk Factors: Hypertension, Diabetes, no history of smoking, coronary                    artery disease. Other Factors: CHF, OSA, Diabetic Neuropathy, CKD stage 2.  Performing Technologist: Lowell Guitar RVT, RDMS  Examination Guidelines: A complete evaluation includes at minimum, Doppler waveform signals and systolic blood pressure reading at the level of bilateral brachial, anterior tibial, and posterior tibial arteries, when vessel segments are accessible. Bilateral testing is considered an integral part of a complete examination. Photoelectric Plethysmograph (PPG) waveforms and toe systolic pressure readings are included as required and additional duplex  testing as needed. Limited examinations for reoccurring indications may be performed as noted.  ABI Findings: +---------+------------------+-----+-----------+--------+ Right    Rt Pressure (mmHg)IndexWaveform   Comment  +---------+------------------+-----+-----------+--------+ Brachial 160                                        +---------+------------------+-----+-----------+--------+ PTA      112               0.70 monophasic          +---------+------------------+-----+-----------+--------+ DP       110               0.69 multiphasic         +---------+------------------+-----+-----------+--------+ Great Toe104               0.65 Normal              +---------+------------------+-----+-----------+--------+ +---------+------------------+-----+-----------+-------+ Left     Lt Pressure (mmHg)IndexWaveform   Comment +---------+------------------+-----+-----------+-------+ Brachial 160                                       +---------+------------------+-----+-----------+-------+ PTA      156               0.98 monophasic         +---------+------------------+-----+-----------+-------+ DP       151               0.94 multiphasic        +---------+------------------+-----+-----------+-------+ Great Toe118               0.74 Normal             +---------+------------------+-----+-----------+-------+ +-------+-----------+-----------+------------+------------+  ABI/TBIToday's ABIToday's TBIPrevious ABIPrevious TBI +-------+-----------+-----------+------------+------------+ Right  0.70       0.65                                +-------+-----------+-----------+------------+------------+ Left   0.98       0.74                                +-------+-----------+-----------+------------+------------+  Arterial wall calcification precludes accurate ankle pressures and ABIs. PPG tracings display appropriate pulsatility.  Summary: Right: Resting right  ankle-brachial index indicates moderate right lower extremity arterial disease. The right toe-brachial index is abnormal. Left: Resting left ankle-brachial index is within normal range. The left toe-brachial index is normal. *See table(s) above for measurements and observations.  Electronically signed by Coral Else MD on 06/08/2023 at 4:08:55 PM.    Final     Assessment & Plan:   Vitamin B12 deficiency anemia due to intrinsic factor deficiency -     IBC + Ferritin; Future -     CBC with Differential/Platelet; Future -     Vitamin B12; Future -     Folate; Future  CKD (chronic kidney disease) stage 2, GFR 60-89 ml/min- Will avoid nephrotoxic agents  -     Basic metabolic panel; Future -     Microalbumin / creatinine urine ratio; Future  Insulin-requiring or dependent type II diabetes mellitus (HCC)- Blood sugar is well controlled. -     Hemoglobin A1c; Future -     Microalbumin / creatinine urine ratio; Future -     HM Diabetes Foot Exam  Primary hypertension- His BP is well controlled.  Dyslipidemia, goal LDL below 70 - LDL goal achieved. Doing well on the statin  -     Lipid panel; Future -     Hepatic function panel; Future -     Hemoglobin A1c; Future  Other diabetic neurological complication associated with diabetes mellitus due to underlying condition (HCC) -     traMADol HCl; Take 1 tablet (50 mg total) by mouth every 12 (twelve) hours as needed.  Dispense: 180 tablet; Refill: 0  Lumbar radiculopathy -     traMADol HCl; Take 1 tablet (50 mg total) by mouth every 12 (twelve) hours as needed.  Dispense: 180 tablet; Refill: 0  Postlaminectomy syndrome, lumbar region -     traMADol HCl; Take 1 tablet (50 mg total) by mouth every 12 (twelve) hours as needed.  Dispense: 180 tablet; Refill: 0  Pure hyperglyceridemia -     Lipid panel; Future  Benign prostatic hyperplasia with weak urinary stream- PSA is not rising. -     PSA; Future -     Urinalysis, Routine w reflex  microscopic; Future  Chronic combined systolic and diastolic congestive heart failure (HCC)- Normal volume status.     Follow-up: Return in about 4 months (around 07/03/2024).  Sanda Linger, MD

## 2024-03-04 ENCOUNTER — Encounter: Payer: Self-pay | Admitting: Internal Medicine

## 2024-03-04 LAB — URINALYSIS, ROUTINE W REFLEX MICROSCOPIC
Bilirubin Urine: NEGATIVE
Hgb urine dipstick: NEGATIVE
Ketones, ur: NEGATIVE
Nitrite: NEGATIVE
Specific Gravity, Urine: 1.025 (ref 1.000–1.030)
Total Protein, Urine: NEGATIVE
Urine Glucose: >=1000 — AB
Urobilinogen, UA: 0.2 (ref 0.0–1.0)
pH: 6 (ref 5.0–8.0)

## 2024-03-09 NOTE — Progress Notes (Unsigned)
 [facility] [address] [place]. [place] [address] [phone]       OFFICE FOLLOW UP NOTE  [name] Date of Birth:  12-Apr-1951 Medical Record Number:  [id number]    Primary neurologist: Dr. Frances Furbish Reason for visit: CPAP follow-up   Virtual Visit via Video Note  I connected with [name] on 03/10/24 at  2:15 PM EDT by a video enabled telemedicine application and verified that I am speaking with the correct person using two identifiers.  Location: Patient: at home Provider: in office, [facility]   I discussed the limitations of evaluation and management by telemedicine and the availability of in person appointments. The patient expressed understanding and agreed to proceed.    SUBJECTIVE:   Follow-up visit:  Prior visit: 08/26/2023  Brief HPI:   [name] is a 73 y.o. male who was initially seen by Dr. Frances Furbish on 04/02/2023 to reestablish care for known sleep apnea on CPAP and need of new machine. ESS 23/24. FSS 63/63.  Noted suboptimal compliance with current machine.  HST 05/2023 showed severe OSA with total AHI of 61.5/h and O2 nadir of 82%.  Noted moderate central apnea component.  AutoPap set up 06/19/2023.   Interval history:  Patient returns for CPAP compliance visit.  Compliance report continues to show excellent usage and optimal residual AHI. Has been having some issues getting a good seal with his nasal pillow causing leaks and head gear can slip off at times. He has not yet further discussed with DME. He otherwise is tolerating well. Reports continued benefit in regards to daytime energy levels and sleep quality.  ESS 19/24.  DME: [employer]           ROS:   14 system review of systems performed and negative with exception of those listed in HPI  PMH:  Past Medical History:  Diagnosis Date   Arthritis    "mild arthritis in hip"   CAD (coronary artery disease)    CHF (congestive heart failure) (HCC)    Complication of  anesthesia    "lungs filled up with fluid"    Diabetes mellitus    Type II   Glaucoma    Heart murmur    HTN (hypertension)    Hypercholesterolemia    LBP (low back pain)    Legally blind    Peripheral neuropathy    Sleep apnea    c-pap nightly   Staph infection    "from back surgery"   Status post insertion of drug eluting coronary artery stent    Trigger finger    Ulcer    Wears glasses    "to protect cornea" - legally blind    PSH:  Past Surgical History:  Procedure Laterality Date   CHOLECYSTECTOMY  09/15/2012   Procedure: LAPAROSCOPIC CHOLECYSTECTOMY WITH INTRAOPERATIVE CHOLANGIOGRAM;  Surgeon: Valarie Merino, MD;  Location: [facility] OR;  Service: General;  Laterality: N/A;   COLONOSCOPY     EYE SURGERY Bilateral    cataracts   INGUINAL HERNIA REPAIR     right   LUMBAR FUSION     LUMBAR LAMINECTOMY/DECOMPRESSION MICRODISCECTOMY N/A 10/26/2015   Procedure: RIGHT AND CENTRAL LUMBAR LAMINECTOMY L3-4, RIGHT L5-S1 LATERAL RECESS DECOMPRESSION;  Surgeon: Kerrin Champagne, MD;  Location: [facility] OR;  Service: Orthopedics;  Laterality: N/A;   NECK SURGERY     peptic ulcer dz surgery  pt was in his 43s   bleeding ulcer.    Prosthetic Cornea placement, Right eye  2007   [facility]   TRIGGER FINGER RELEASE Right 10/26/2015   Procedure: RELEASE TRIGGER FINGER RIGHT THUMB;  Surgeon: Kerrin Champagne, MD;  Location: [facility] OR;  Service: Orthopedics;  Laterality: Right;    Social History:  Social History   Socioeconomic History   Marital status: Married    Spouse name: [name]   Number of children: 3   Years of education: 12   Highest education level: Not on file  Occupational History   Occupation: disabled    Comment: blind  Tobacco Use   Smoking status: Never   Smokeless tobacco: Never  Vaping Use   Vaping status: Never Used  Substance and Sexual Activity   Alcohol use: No   Drug use: No   Sexual activity: Not Currently  Other Topics Concern   Not on file  Social History  Narrative   Occupation: disabled, blind   Married   Regular Exercise-no   Lives at home with his wife.   Right-handed.   2-3 cups caffeine per day.   Social Drivers of Corporate investment banker Strain: Low Risk  (06/11/2023)   Overall Financial Resource Strain (CARDIA)    Difficulty of Paying Living Expenses: Not hard at all  Food Insecurity: No Food Insecurity (06/11/2023)   Hunger Vital Sign    Worried About Running Out of Food in the Last Year: Never true    Ran Out of Food in the Last Year: Never true  Transportation Needs: No Transportation Needs (06/11/2023)   PRAPARE - Administrator, Civil Service (Medical): No    Lack of Transportation (Non-Medical): No  Physical Activity: Inactive (06/11/2023)   Exercise Vital Sign    Days of Exercise per Week: 0 days    Minutes of Exercise per Session: 0 min  Stress: No Stress Concern Present (06/11/2023)   [employer] of Occupational Health - Occupational Stress Questionnaire    Feeling of Stress : Not at all  Social Connections: Socially Integrated (06/11/2023)   Social Connection and Isolation Panel [NHANES]    Frequency of Communication with Friends and Family: More than three times a week    Frequency of Social Gatherings with Friends and Family: More than three times a week    Attends Religious Services: More than 4 times per year    Active Member of [employer] or Organizations: Yes    Attends Engineer, structural: More than 4 times per year    Marital Status: Married  Catering manager Violence: Not At Risk (06/11/2023)   Humiliation, Afraid, Rape, and Kick questionnaire    Fear of Current or Ex-Partner: No    Emotionally Abused: No    Physically Abused: No    Sexually Abused: No    Family History:  Family History  Problem Relation Age of Onset   Breast cancer Mother    Colon cancer Mother    Hypertension Mother    Diabetes Mother    Hypertension Father    Sleep apnea Sister    Diabetes Sister     Sleep apnea Brother    Diabetes Brother    Colon cancer Other        Elevated Risk for   Esophageal cancer Neg Hx    Stomach cancer Neg Hx    Rectal cancer Neg Hx     Medications:   Current Outpatient Medications on File Prior to Visit  Medication Sig Dispense Refill   ASPIRIN LOW DOSE 81 MG tablet TAKE 1 TABLET (  81 MG TOTAL) BY MOUTH DAILY. SWALLOW WHOLE. 90 tablet 1   atorvastatin (LIPITOR) 80 MG tablet TAKE 1 TABLET BY MOUTH DAILY AT 6PM. 90 tablet 1   carvedilol (COREG) 12.5 MG tablet Take one (1) tablet by mouth ( 12.5 mg) twice daily. 180 tablet 3   Cholecalciferol (VITAMIN D3) 25 MCG (1000 UT) CAPS Take by mouth.     Continuous Blood Gluc Receiver (FREESTYLE LIBRE 3 READER) DEVI 1 Act by Does not apply route daily. 1 each 3   Continuous Blood Gluc Sensor (FREESTYLE LIBRE 3 SENSOR) MISC 1 Act by Does not apply route daily. Place 1 sensor on the skin every 14 days. Use to check glucose continuously 2 each 5   empagliflozin (JARDIANCE) 25 MG TABS tablet TAKE 1 TABLET EVERY DAY 90 tablet 1   Evolocumab (REPATHA) 140 MG/ML SOSY INJECT 140MG  UNDER THE SKIN EVERY 2 WEEKS 6 mL 1   ferrous sulfate 325 (65 FE) MG tablet TAKE 1 TABLET BY MOUTH EVERY DAY WITH BREAKFAST 100 tablet 1   fluticasone (FLONASE) 50 MCG/ACT nasal spray SPRAY 2 SPRAYS INTO EACH NOSTRIL EVERY DAY 48 mL 1   gabapentin (NEURONTIN) 300 MG capsule Take 1 capsule (300 mg total) by mouth 3 (three) times daily. Take  capsule three times a day 270 capsule 1   Glucagon 1 MG/0.2ML SOAJ Inject 1 Act into the skin once as needed for up to 1 dose. 0.2 mL 3   indapamide (LOZOL) 1.25 MG tablet Take 1.25 mg by mouth daily.     Insulin Pen Needle (DROPLET PEN NEEDLES) 32G X 4 MM MISC USE EVERY DAY 100 each 1   isosorbide mononitrate (IMDUR) 120 MG 24 hr tablet Take 1 tablet (120 mg total) by mouth daily. 90 tablet 1   levocetirizine (XYZAL) 5 MG tablet TAKE 1 TABLET EVERY DAY IN THE EVENING 90 tablet 1   metFORMIN (GLUCOPHAGE) 500 MG  tablet TAKE 1 TABLET BY MOUTH TWICE A DAY WITH FOOD 180 tablet 1   nitroGLYCERIN (NITROSTAT) 0.4 MG SL tablet Place 1 tablet (0.4 mg total) under the tongue every 5 (five) minutes as needed for chest pain. Do not exceed total of 3 dose in 15 minutes. 25 tablet 3   pantoprazole (PROTONIX) 40 MG tablet TAKE 1 TABLET BY MOUTH TWICE A DAY 180 tablet 1   ranolazine (RANEXA) 500 MG 12 hr tablet TAKE 1 TABLET TWICE DAILY 180 tablet 1   SOLIQUA 100-33 UNT-MCG/ML SOPN INJECT 60 UNITS INTO THE SKIN DAILY. 45 mL 1   tamsulosin (FLOMAX) 0.4 MG CAPS capsule Take 0.4 mg by mouth once.     traMADol (ULTRAM) 50 MG tablet Take 1 tablet (50 mg total) by mouth every 12 (twelve) hours as needed. 180 tablet 0   VASCEPA 1 g capsule TAKE 2 CAPSULES BY MOUTH 2 TIMES DAILY. 360 capsule 1   No current facility-administered medications on file prior to visit.    Allergies:   Allergies  Allergen Reactions   Furosemide     pancreatitis   Olmesartan Other (See Comments)    hyperkalemia   Amlodipine Swelling    LE EDEMA   Diamox [Acetazolamide] Itching    hives   Lisinopril Rash      OBJECTIVE:  Physical Exam General: well developed, well nourished, very pleasant elderly male, seated, in no evident distress  Neurologic Exam Mental Status: Awake and fully alert. Oriented to place and time. Recent and remote memory intact. Attention span, concentration and fund of  knowledge appropriate. Mood and affect appropriate.        ASSESSMENT/PLAN: [name] is a 73 y.o. year old male    OSA on CPAP : Compliance report shows satisfactory usage with optimal residual AHI.  Continue current pressure setting of 7 with EPR 2.  Advised to follow-up with DME company regarding mask concerns (see HPI).  Discussed continued nightly usage with ensuring greater than 4 hours nightly for optimal benefit and per insurance purposes.  Continue to follow with DME company for any needed supplies or CPAP related  concerns     Follow up in 1 year via MyChart video visit or call earlier if needed   CC:  PCP: Etta Grandchild, MD    I spent 15 minutes of face-to-face and non-face-to-face time with patient via MyChart video visit.  This included previsit chart review, lab review, study review, order entry, electronic health record documentation, patient education regarding diagnosis of sleep apnea with review and discussion of compliance report and answered all other questions to patient satisfaction  Ihor Austin, [facility]  [facility] [address] [place], [place] [address]  Phone [phone] Fax [phone] Note: This document was prepared with digital dictation and possible smart phrase technology. Any transcriptional errors that result from this process are unintentional.

## 2024-03-10 ENCOUNTER — Encounter: Payer: Self-pay | Admitting: Adult Health

## 2024-03-10 ENCOUNTER — Telehealth (INDEPENDENT_AMBULATORY_CARE_PROVIDER_SITE_OTHER): Payer: Medicare PPO | Admitting: Adult Health

## 2024-03-10 DIAGNOSIS — G4733 Obstructive sleep apnea (adult) (pediatric): Secondary | ICD-10-CM | POA: Diagnosis not present

## 2024-03-15 ENCOUNTER — Encounter: Payer: Self-pay | Admitting: Podiatry

## 2024-03-15 ENCOUNTER — Ambulatory Visit: Admitting: Podiatry

## 2024-03-15 VITALS — Ht 70.0 in | Wt 221.0 lb

## 2024-03-15 DIAGNOSIS — B351 Tinea unguium: Secondary | ICD-10-CM

## 2024-03-15 DIAGNOSIS — E1142 Type 2 diabetes mellitus with diabetic polyneuropathy: Secondary | ICD-10-CM

## 2024-03-15 DIAGNOSIS — M79609 Pain in unspecified limb: Secondary | ICD-10-CM

## 2024-03-15 NOTE — Progress Notes (Signed)
  Subjective:  Patient ID: [name], male    DOB: [birth year],  MRN: [id number]  KAYVAN HOEFLING presents to clinic today for at risk foot care with history of diabetic neuropathy and painful, elongated thickened toenails x 10 which are symptomatic when wearing enclosed shoe gear. This interferes with his/her daily activities. His wife is present during today's visit. Chief Complaint  Patient presents with   RFC    He is here for diabetic nail trim, PCP is thomas jones, seen last week and last A1C was 6.9.    New problem(s): None.   PCP is Etta Grandchild, MD.  Allergies  Allergen Reactions   Furosemide     pancreatitis   Olmesartan Other (See Comments)    hyperkalemia   Amlodipine Swelling    LE EDEMA   Diamox [Acetazolamide] Itching    hives   Lisinopril Rash    Review of Systems: Negative except as noted in the HPI.  Objective: No changes noted in today's physical examination. There were no vitals filed for this visit. PATTON RABINOVICH is a pleasant 73 y.o. male in NAD. AAO x 3.  Vascular Examination: CFT <3 seconds b/l. DP pulses faintly palpable b/l. PT pulses nonpalpable b/l. Digital hair absent. Skin temperature gradient warm to warm b/l. No pain with calf compression. No ischemia or gangrene. No cyanosis or clubbing noted b/l.    Neurological Examination: Pt has subjective symptoms of neuropathy. Protective sensation diminished with 10g monofilament b/l.  Dermatological Examination: Pedal skin warm and supple b/l. No open wounds b/l. No interdigital macerations. Toenails 1-5 b/l thick, discolored, elongated with subungual debris and pain on dorsal palpation.  No hyperkeratotic nor porokeratotic lesions present on today's visit.  Musculoskeletal Examination: Muscle strength 3/5 to all LE muscle groups of left lower extremity. Muscle strength 2/5 to all LE muscle groups of right lower extremity.  Radiographs: None  Last HgA1c:      Latest Ref Rng & Units 03/03/2024     3:57 PM 06/23/2023    2:32 PM  Hemoglobin A1C  Hemoglobin-A1c 4.6 - 6.5 % 6.4  6.9     Assessment/Plan: 1. Pain due to onychomycosis of nail   2. Diabetic peripheral neuropathy associated with type 2 diabetes mellitus (HCC)     Patient was evaluated and treated. All patient's and/or POA's questions/concerns addressed on today's visit. Mycotic toenails 1-5 debrided in length and girth without incident.  Continue daily foot inspections and monitor blood glucose per PCP/Endocrinologist's recommendations.Continue soft, supportive shoe gear daily. Report any pedal injuries to medical professional. Call office if there are any quesitons/concerns. -Patient/POA to call should there be question/concern in the interim.   Return in about 3 months (around 06/15/2024).  Freddie Breech, DPM      [facility] LOCATION: [address] [place] [address]                   Office [phone]   [facility] LOCATION: [address] [place], [place] [address] Office [phone]

## 2024-03-18 DIAGNOSIS — G4733 Obstructive sleep apnea (adult) (pediatric): Secondary | ICD-10-CM | POA: Diagnosis not present

## 2024-03-19 DIAGNOSIS — G4733 Obstructive sleep apnea (adult) (pediatric): Secondary | ICD-10-CM | POA: Diagnosis not present

## 2024-04-06 ENCOUNTER — Other Ambulatory Visit: Payer: Self-pay

## 2024-04-06 ENCOUNTER — Encounter: Payer: Self-pay | Admitting: Internal Medicine

## 2024-04-06 DIAGNOSIS — E118 Type 2 diabetes mellitus with unspecified complications: Secondary | ICD-10-CM

## 2024-04-06 DIAGNOSIS — E119 Type 2 diabetes mellitus without complications: Secondary | ICD-10-CM

## 2024-04-06 MED ORDER — DROPLET PEN NEEDLES 32G X 4 MM MISC: 1.0000 | Freq: Every day | 1 refills | Status: DC

## 2024-04-06 MED ORDER — SOLIQUA 100-33 UNT-MCG/ML ~~LOC~~ SOPN: 60.0000 [IU] | PEN_INJECTOR | Freq: Every day | SUBCUTANEOUS | 1 refills | Status: DC

## 2024-04-14 ENCOUNTER — Encounter: Payer: Self-pay | Admitting: Internal Medicine

## 2024-04-15 ENCOUNTER — Ambulatory Visit: Payer: Self-pay

## 2024-04-15 ENCOUNTER — Ambulatory Visit: Admitting: Nurse Practitioner

## 2024-04-15 VITALS — BP 100/64 | HR 81 | Temp 98.2°F | Ht 70.0 in

## 2024-04-15 DIAGNOSIS — B9689 Other specified bacterial agents as the cause of diseases classified elsewhere: Secondary | ICD-10-CM | POA: Insufficient documentation

## 2024-04-15 DIAGNOSIS — J208 Acute bronchitis due to other specified organisms: Secondary | ICD-10-CM

## 2024-04-15 DIAGNOSIS — M5416 Radiculopathy, lumbar region: Secondary | ICD-10-CM

## 2024-04-15 LAB — POCT INFLUENZA A/B
Influenza A, POC: NEGATIVE
Influenza B, POC: NEGATIVE

## 2024-04-15 LAB — POCT RAPID STREP A (OFFICE): Rapid Strep A Screen: NEGATIVE

## 2024-04-15 LAB — POCT RESPIRATORY SYNCYTIAL VIRUS: RSV Rapid Ag: NEGATIVE

## 2024-04-15 LAB — POC COVID19 BINAXNOW: SARS Coronavirus 2 Ag: NEGATIVE

## 2024-04-15 MED ORDER — TRAMADOL HCL 50 MG PO TABS: 50.0000 mg | ORAL_TABLET | Freq: Two times a day (BID) | ORAL | 0 refills | Status: DC | PRN

## 2024-04-15 MED ORDER — PROMETHAZINE-DM 6.25-15 MG/5ML PO SYRP: 5.0000 mL | ORAL_SOLUTION | Freq: Three times a day (TID) | ORAL | 0 refills | Status: DC | PRN

## 2024-04-15 MED ORDER — ALBUTEROL SULFATE HFA 108 (90 BASE) MCG/ACT IN AERS: 2.0000 | INHALATION_SPRAY | Freq: Four times a day (QID) | RESPIRATORY_TRACT | 0 refills | Status: AC | PRN

## 2024-04-15 MED ORDER — AZITHROMYCIN 250 MG PO TABS: ORAL_TABLET | ORAL | 0 refills | Status: AC

## 2024-04-15 MED ORDER — SPACER/AERO-HOLDING CHAMBERS DEVI: 1.0000 | 0 refills | Status: AC | PRN

## 2024-04-15 NOTE — Telephone Encounter (Signed)
 Patient's wife is calling in follow up to her message for provider yesterday:: Chief Complaint: cough, weakness, sweating Symptoms: weakness, sweating, cough, body aches Frequency: 3-4 days Pertinent Negatives: Patient denies fever Disposition: [] ED /[] Urgent Care (no appt availability in office) / [x] Appointment(In office/virtual)/ []  [facility]/ [] Home Care/ [] Refused Recommended Disposition /[] [facility]/ []  Follow-up with PCP Additional Notes: Patient has been scheduled- wife states patient is extremely weak- if she can not get him to office- may need assistance- or ED

## 2024-04-15 NOTE — Assessment & Plan Note (Signed)
 Acute VSS, oxygen saturation 97% on RA Patient is coughing frequently in the office Treat with albuterol and promethazine dextromethorphan as needed for cough/wheezing. Use OTC mucinex. Use OTC flonase  nasal spray. Will provide prescription for Zpak for patient to use if symptoms worsen over the weekend of if they do not improve by this time next week.

## 2024-04-15 NOTE — Progress Notes (Signed)
 Established Patient Office Visit  Subjective   Patient ID: [name], male    DOB: December 30, 1950  Age: 73 y.o. MRN: [id number]  Chief Complaint  Patient presents with   Cough  Mr. Coury is a 73 y/o male with past medical history significant for arthritis, CAD, CHF, T2DM with neuropathy, glaucoma, heart murmur, HTN, legal blindness, HLD, aleep apnea.   Cough: symptom onset 4 days ago. Experiencing chills, sore throat, productive cough. Has been treating symptoms with theraflu without relief.   His wife accompanies him and is requesting refill of his tramadol . Which he takes for chronic back pain.     Review of Systems  Constitutional:  Positive for chills.  HENT:  Positive for sore throat.   Respiratory:  Positive for cough and sputum production.       Objective:     BP 100/64   Pulse 81   Temp 98.2 F (36.8 C) (Temporal)   Ht 5\' 10"  (1.778 m)   SpO2 97%   BMI 31.71 kg/m    Physical Exam Vitals reviewed.  Constitutional:      Appearance: Normal appearance.  HENT:     Head: Normocephalic and atraumatic.  Cardiovascular:     Rate and Rhythm: Normal rate and regular rhythm.  Pulmonary:     Effort: Pulmonary effort is normal.     Breath sounds: Normal breath sounds.  Musculoskeletal:     Cervical back: Neck supple.  Skin:    General: Skin is warm and dry.  Neurological:     Mental Status: He is alert and oriented to person, place, and time.  Psychiatric:        Mood and Affect: Mood normal.        Behavior: Behavior normal.        Thought Content: Thought content normal.        Judgment: Judgment normal.      Results for orders placed or performed in visit on 04/15/24  POC COVID-19 BinaxNow  Result Value Ref Range   SARS Coronavirus 2 Ag Negative Negative  POCT rapid strep A  Result Value Ref Range   Rapid Strep A Screen Negative Negative  POCT Influenza A/B  Result Value Ref Range   Influenza A, POC Negative Negative   Influenza B, POC  Negative Negative  POCT respiratory syncytial virus  Result Value Ref Range   RSV Rapid Ag negative       The ASCVD Risk score (Arnett DK, et al., 2019) failed to calculate for the following reasons:   The valid total cholesterol range is 130 to 320 mg/dL    Assessment & Plan:   Problem List Items Addressed This Visit       Respiratory   Acute bacterial bronchitis   Acute VSS, oxygen saturation 97% on RA Patient is coughing frequently in the office Treat with albuterol  and promethazine  dextromethorphan as needed for cough/wheezing. Use OTC mucinex. Use OTC flonase  nasal spray. Will provide prescription for Zpak for patient to use if symptoms worsen over the weekend of if they do not improve by this time next week.          Relevant Medications   albuterol  (VENTOLIN  HFA) 108 (90 Base) MCG/ACT inhaler   promethazine -dextromethorphan (PROMETHAZINE -DM) 6.25-15 MG/5ML syrup   azithromycin  (ZITHROMAX ) 250 MG tablet   Spacer/Aero-Holding Chambers DEVI   Other Relevant Orders   POC COVID-19 BinaxNow (Completed)   POCT rapid strep A (Completed)   POCT Influenza A/B (Completed)  POCT respiratory syncytial virus (Completed)     Nervous and Auditory   Lumbar radiculopathy - Primary   Chronic [place] controlled substance database reviewed. 30-day supply of tramadol  sent to pharmacy.  Patient told to follow-up with PCP for further management.       Relevant Medications   traMADol  (ULTRAM ) 50 MG tablet    Return if symptoms worsen or fail to improve.    Zorita Hiss, NP

## 2024-04-15 NOTE — Assessment & Plan Note (Addendum)
 Chronic [place] controlled substance database reviewed. 30-day supply of tramadol  sent to pharmacy.  Patient told to follow-up with PCP for further management.

## 2024-04-15 NOTE — Telephone Encounter (Signed)
 Copied from CRM [id number]. Topic: General - Other >> Apr 15, 2024  8:33 AM Emylou G wrote: Reason for CRM: Wife called.. checking status of message from yesterday for patient my chart: needing medication: Head congestion, dry cough, fatigue, sweating , body aches and sore throat.  His number on file is good 1st attempt; left vm.

## 2024-04-15 NOTE — Telephone Encounter (Signed)
 Reason for Disposition . [1] Continuous (nonstop) coughing interferes with work or school AND [2] no improvement using cough treatment per Care Advice  Answer Assessment - Initial Assessment Questions 1. ONSET: "When did the cough begin?"      3-4 days 2. SEVERITY: "How bad is the cough today?"      Using TheraFlu- 2 days, keeps him up at night- all day 3. SPUTUM: "Describe the color of your sputum" (none, dry cough; clear, white, yellow, green)     Deep in the chest 4. HEMOPTYSIS: "Are you coughing up any blood?" If so ask: "How much?" (flecks, streaks, tablespoons, etc.)     na 5. DIFFICULTY BREATHING: "Are you having difficulty breathing?" If Yes, ask: "How bad is it?" (e.g., mild, moderate, severe)    - MILD: No SOB at rest, mild SOB with walking, speaks normally in sentences, can lie down, no retractions, pulse < 100.    - MODERATE: SOB at rest, SOB with minimal exertion and prefers to sit, cannot lie down flat, speaks in phrases, mild retractions, audible wheezing, pulse 100-120.    - SEVERE: Very SOB at rest, speaks in single words, struggling to breathe, sitting hunched forward, retractions, pulse > 120      No- ut only moving a few steps 6. FEVER: "Do you have a fever?" If Yes, ask: "What is your temperature, how was it measured, and when did it start?"     Sweating, body aches  Protocols used: Cough - Acute Non-Productive-A-AH

## 2024-04-18 ENCOUNTER — Encounter: Payer: Self-pay | Admitting: Pharmacist

## 2024-04-18 DIAGNOSIS — G4733 Obstructive sleep apnea (adult) (pediatric): Secondary | ICD-10-CM | POA: Diagnosis not present

## 2024-04-18 NOTE — Progress Notes (Signed)
 Received notice on [facility] that records were needed to finish [facility] 3 order that was placed in March. Completed needed documentation and submitted to [facility].  Rainelle Bur, PharmD, BCPS, CPP Clinical Pharmacist Practitioner [facility] at [facility] [phone]

## 2024-05-18 DIAGNOSIS — G4733 Obstructive sleep apnea (adult) (pediatric): Secondary | ICD-10-CM | POA: Diagnosis not present

## 2024-05-25 ENCOUNTER — Telehealth: Payer: Self-pay | Admitting: Internal Medicine

## 2024-05-25 NOTE — Telephone Encounter (Signed)
 Copied from CRM [id number]. Topic: Clinical - Medical Advice >> May 25, 2024  3:24 PM Taleah C wrote: Reason for CRM:  Mariah Shines from [facility] called in regards to a clinical open care opportunity for the patient for the PCP's review.   She stated that the patient May possibly have diabetes but may not be on a statin. Per the current ADA guideline and ACC/AHA guideline recommendation, consider reevaluating patients and prescribe a statin if clinically appropriate. Please send to pharmacy as soon as possible. Please do asap so they can close the opportunity before the end of the year and document the proper ICD code. Please advise at [phone].

## 2024-05-27 NOTE — Telephone Encounter (Signed)
 Patient is currently on a statin. Thanks!

## 2024-06-01 ENCOUNTER — Encounter: Payer: Self-pay | Admitting: Internal Medicine

## 2024-06-01 ENCOUNTER — Other Ambulatory Visit: Payer: Self-pay | Admitting: Nurse Practitioner

## 2024-06-01 ENCOUNTER — Other Ambulatory Visit: Payer: Self-pay

## 2024-06-01 DIAGNOSIS — J301 Allergic rhinitis due to pollen: Secondary | ICD-10-CM

## 2024-06-01 DIAGNOSIS — M5416 Radiculopathy, lumbar region: Secondary | ICD-10-CM

## 2024-06-01 MED ORDER — LEVOCETIRIZINE DIHYDROCHLORIDE 5 MG PO TABS: ORAL_TABLET | ORAL | 1 refills | Status: DC

## 2024-06-08 ENCOUNTER — Ambulatory Visit: Payer: Self-pay | Attending: Nurse Practitioner | Admitting: Nurse Practitioner

## 2024-06-08 ENCOUNTER — Ambulatory Visit: Payer: Medicare PPO | Admitting: Cardiology

## 2024-06-08 ENCOUNTER — Encounter: Payer: Self-pay | Admitting: Nurse Practitioner

## 2024-06-08 VITALS — BP 130/82 | HR 67 | Ht 71.0 in | Wt 215.0 lb

## 2024-06-08 DIAGNOSIS — E785 Hyperlipidemia, unspecified: Secondary | ICD-10-CM | POA: Diagnosis not present

## 2024-06-08 DIAGNOSIS — G4733 Obstructive sleep apnea (adult) (pediatric): Secondary | ICD-10-CM

## 2024-06-08 DIAGNOSIS — I10 Essential (primary) hypertension: Secondary | ICD-10-CM | POA: Diagnosis not present

## 2024-06-08 DIAGNOSIS — I5032 Chronic diastolic (congestive) heart failure: Secondary | ICD-10-CM | POA: Diagnosis not present

## 2024-06-08 DIAGNOSIS — I2510 Atherosclerotic heart disease of native coronary artery without angina pectoris: Secondary | ICD-10-CM

## 2024-06-08 MED ORDER — NITROGLYCERIN 0.4 MG SL SUBL: 0.4000 mg | SUBLINGUAL_TABLET | SUBLINGUAL | 3 refills | Status: DC | PRN

## 2024-06-08 MED ORDER — ISOSORBIDE MONONITRATE ER 120 MG PO TB24: 120.0000 mg | ORAL_TABLET | Freq: Every day | ORAL | 3 refills | Status: AC

## 2024-06-08 NOTE — Patient Instructions (Signed)
 Medication Instructions:  Your physician recommends that you continue on your current medications as directed. Please refer to the Current Medication list given to you today.  *If you need a refill on your cardiac medications before your next appointment, please call your pharmacy*  Lab Work: NONE ordered at this time of appointment   Testing/Procedures: NONE ordered at this time of appointment   Follow-Up: At [facility], you and your health needs are our priority.  As part of our continuing mission to provide you with exceptional heart care, our providers are all part of one team.  This team includes your primary Cardiologist (physician) and Advanced Practice Providers or APPs (Physician Assistants and Nurse Practitioners) who all work together to provide you with the care you need, when you need it.  Your next appointment:   6 month(s)  Provider:   Dorothye Gathers, MD    We recommend signing up for the patient portal called MyChart.  Sign up information is provided on this After Visit Summary.  MyChart is used to connect with patients for Virtual Visits (Telemedicine).  Patients are able to view lab/test results, encounter notes, upcoming appointments, etc.  Non-urgent messages can be sent to your provider as well.   To learn more about what you can do with MyChart, go to [email or web address].

## 2024-06-08 NOTE — Progress Notes (Signed)
 Office Visit    Patient Name: [name] Date of Encounter: 06/08/2024  Primary Care Provider:  Arcadio Knuckles, MD Primary Cardiologist:  Dorothye Gathers, MD  Chief Complaint    73 year old male with a history of CAD s/p BMS-LCx in 2008, chronic diastolic heart failure, hypertension, hyperlipidemia, type 2 diabetes, glaucoma (legally blind), OSA, and obesity who presents for follow-up related to CAD.  Past Medical History    Past Medical History:  Diagnosis Date   Arthritis    mild arthritis in hip   CAD (coronary artery disease)    CHF (congestive heart failure) (HCC)    Complication of anesthesia    lungs filled up with fluid    Diabetes mellitus    Type II   Glaucoma    Heart murmur    HTN (hypertension)    Hypercholesterolemia    LBP (low back pain)    Legally blind    Peripheral neuropathy    Sleep apnea    c-pap nightly   Staph infection    from back surgery   Status post insertion of drug eluting coronary artery stent    Trigger finger    Ulcer    Wears glasses    to protect cornea - legally blind   Past Surgical History:  Procedure Laterality Date   CHOLECYSTECTOMY  09/15/2012   Procedure: LAPAROSCOPIC CHOLECYSTECTOMY WITH INTRAOPERATIVE CHOLANGIOGRAM;  Surgeon: Azucena Bollard, MD;  Location: [facility] OR;  Service: General;  Laterality: N/A;   COLONOSCOPY     EYE SURGERY Bilateral    cataracts   INGUINAL HERNIA REPAIR     right   LUMBAR FUSION     LUMBAR LAMINECTOMY/DECOMPRESSION MICRODISCECTOMY N/A 10/26/2015   Procedure: RIGHT AND CENTRAL LUMBAR LAMINECTOMY L3-4, RIGHT L5-S1 LATERAL RECESS DECOMPRESSION;  Surgeon: Alphonso Jean, MD;  Location: [facility] OR;  Service: Orthopedics;  Laterality: N/A;   NECK SURGERY     peptic ulcer dz surgery  pt was in his 18s   bleeding ulcer.    Prosthetic Cornea placement, Right eye  2007   [facility]   TRIGGER FINGER RELEASE Right 10/26/2015   Procedure: RELEASE TRIGGER FINGER RIGHT THUMB;  Surgeon: Alphonso Jean, MD;  Location: [facility] OR;  Service: Orthopedics;  Laterality: Right;    Allergies  Allergies  Allergen Reactions   Furosemide     pancreatitis   Olmesartan  Other (See Comments)    hyperkalemia   Amlodipine  Swelling    LE EDEMA   Diamox [Acetazolamide] Itching    hives   Lisinopril Rash     Labs/Other Studies Reviewed    The following studies were reviewed today:  Cardiac Studies & Procedures   ______________________________________________________________________________________________   STRESS TESTS  MYOCARDIAL PERFUSION IMAGING 07/10/2022  Narrative   The study is normal. The study is low risk.   Left ventricular function is normal. Nuclear stress EF: 61 %. The left ventricular ejection fraction is normal (55-65%). End diastolic cavity size is normal.   Prior study available for comparison from 05/17/2020.   ECHOCARDIOGRAM  ECHOCARDIOGRAM COMPLETE 07/10/2022  Narrative ECHOCARDIOGRAM REPORT    Patient Name:   [name] Date of Exam: 07/10/2022 Medical Rec #:  [id number]     Height:       70.0 in Accession #:    [id number]    Weight:       228.0 lb Date of Birth:  08-05-1951     BSA:  2.207 m Patient Age:    70 years      BP:           122/70 mmHg Patient Gender: M             HR:           54 bpm. Exam Location:  [address]  Procedure: 2D Echo, Cardiac Doppler, Color Doppler and Intracardiac Opacification Agent  Indications:    R06.00 Dyspnea  History:        Patient has prior history of Echocardiogram examinations, most recent 07/10/2017. CHF, CAD, Signs/Symptoms:Dyspnea and Shortness of Breath; Risk Factors:Hypertension, Diabetes, Dyslipidemia and Sleep Apnea. Obesity.  Sonographer:    Ewing Holiday RDCS Referring Phys: Arcadio Knuckles  IMPRESSIONS   1. Left ventricular ejection fraction, by estimation, is 65 to 70%. The left ventricle has normal function. The left ventricle has no regional wall motion abnormalities. There is  moderate asymmetric left ventricular hypertrophy of the basal-septal segment. Left ventricular diastolic parameters are consistent with Grade I diastolic dysfunction (impaired relaxation). 2. Right ventricular systolic function is normal. The right ventricular size is normal. 3. The mitral valve is grossly normal. Trivial mitral valve regurgitation. 4. The aortic valve is grossly normal. Aortic valve regurgitation is trivial. 5. Aortic dilatation noted. There is borderline dilatation of the ascending aorta, measuring 38 mm. 6. The inferior vena cava is normal in size with greater than 50% respiratory variability, suggesting right atrial pressure of 3 mmHg. 7. Cannot exclude a small PFO.  Comparison(s): Changes from prior study are noted. 07/10/2017: LVEF 60-65%, mild MR.  FINDINGS Left Ventricle: Left ventricular ejection fraction, by estimation, is 65 to 70%. The left ventricle has normal function. The left ventricle has no regional wall motion abnormalities. Definity  contrast agent was given IV to delineate the left ventricular endocardial borders. The left ventricular internal cavity size was normal in size. There is moderate asymmetric left ventricular hypertrophy of the basal-septal segment. Left ventricular diastolic parameters are consistent with Grade I diastolic dysfunction (impaired relaxation). Indeterminate filling pressures.  Right Ventricle: The right ventricular size is normal. No increase in right ventricular wall thickness. Right ventricular systolic function is normal.  Left Atrium: Left atrial size was normal in size.  Right Atrium: Right atrial size was normal in size.  Pericardium: There is no evidence of pericardial effusion.  Mitral Valve: The mitral valve is grossly normal. Trivial mitral valve regurgitation.  Tricuspid Valve: The tricuspid valve is grossly normal. Tricuspid valve regurgitation is trivial.  Aortic Valve: The aortic valve is grossly normal. Aortic  valve regurgitation is trivial.  Pulmonic Valve: The pulmonic valve was normal in structure. Pulmonic valve regurgitation is not visualized.  Aorta: Aortic dilatation noted. There is borderline dilatation of the ascending aorta, measuring 38 mm.  Venous: The inferior vena cava is normal in size with greater than 50% respiratory variability, suggesting right atrial pressure of 3 mmHg.  IAS/Shunts: Cannot exclude a small PFO.   LEFT VENTRICLE PLAX 2D LVIDd:         3.75 cm   Diastology LVIDs:         1.55 cm   LV e' medial:    7.62 cm/s LV PW:         1.25 cm   LV E/e' medial:  8.8 LV IVS:        1.40 cm   LV e' lateral:   5.55 cm/s LVOT diam:     2.40 cm   LV E/e' lateral:  12.1 LV SV:         86 LV SV Index:   39 LVOT Area:     4.52 cm   RIGHT VENTRICLE RV Basal diam:  2.80 cm RV S prime:     13.20 cm/s TAPSE (M-mode): 2.6 cm  LEFT ATRIUM             Index        RIGHT ATRIUM           Index LA diam:        3.85 cm 1.74 cm/m   RA Area:     13.20 cm LA Vol (A2C):   67.9 ml 30.77 ml/m  RA Volume:   26.00 ml  11.78 ml/m LA Vol (A4C):   38.4 ml 17.40 ml/m LA Biplane Vol: 52.0 ml 23.56 ml/m AORTIC VALVE LVOT Vmax:   84.80 cm/s LVOT Vmean:  54.150 cm/s LVOT VTI:    0.190 m  AORTA Ao Root diam: 3.35 cm Ao Asc diam:  3.75 cm  MITRAL VALVE MV Area (PHT): cm         SHUNTS MV Decel Time: 259 msec    Systemic VTI:  0.19 m MV E velocity: 67.35 cm/s  Systemic Diam: 2.40 cm MV A velocity: 74.15 cm/s MV E/A ratio:  0.91  Dinah Franco MD Electronically signed by Dinah Franco MD Signature Date/Time: 07/10/2022/3:45:57 PM    Final          ______________________________________________________________________________________________     Recent Labs: 06/23/2023: TSH 2.83 10/26/2023: Pro B Natriuretic peptide (BNP) 16.0 03/03/2024: ALT 16; BUN 13; Creatinine, Ser 1.04; Hemoglobin 13.1; Platelets 226.0; Potassium 4.6; Sodium 139  Recent Lipid Panel    Component  Value Date/Time   CHOL 124 03/03/2024 1557   TRIG 135.0 03/03/2024 1557   HDL 30.60 (L) 03/03/2024 1557   CHOLHDL 4 03/03/2024 1557   VLDL 27.0 03/03/2024 1557   LDLCALC 67 03/03/2024 1557   LDLCALC 173 (H) 02/08/2020 1645   LDLDIRECT 82.7 04/04/2013 1451    History of Present Illness    73 year old male with the above past medical history including CAD s/p BMS-LCx in 2008, chronic diastolic heart failure, hypertension, hyperlipidemia, type 2 diabetes, glaucoma (legally blind), OSA, and obesity.  Cardiac catheterization in 2008 revealed significant disease in the LCx, s/p BMS.  Residual RCA disease was unfavorable to PCI, managed medically.  Nuclear stress tests in 2017, 2021 and 2023 were negative for ischemia.  Echocardiogram in 2023 showed EF 65 to 70%, moderate asymmetric LVH, G1 DD, normal RV systolic function, no significant valvular disease.  He was last seen in the office on 11/30/2023 was stable from a cardiac standpoint.  He denied symptoms concerning for angina, he did report stable chronic shortness of breath with significant exertion.  He presents today for follow-up accompanied by his wife.  Since his last visit he has done well from a cardiac standpoint.  He denies any symptoms concerning for angina, denies dyspnea, edema, PND, orthopnea, weight gain.  Overall, he reports feeling well.  Home Medications    Current Outpatient Medications  Medication Sig Dispense Refill   ASPIRIN  LOW DOSE 81 MG tablet TAKE 1 TABLET (81 MG TOTAL) BY MOUTH DAILY. SWALLOW WHOLE. 90 tablet 1   atorvastatin  (LIPITOR) 80 MG tablet TAKE 1 TABLET BY MOUTH DAILY AT 6PM. 90 tablet 1   carvedilol  (COREG ) 12.5 MG tablet Take one (1) tablet by mouth ( 12.5 mg) twice daily. 180 tablet 3   empagliflozin  (JARDIANCE )  25 MG TABS tablet TAKE 1 TABLET EVERY DAY 90 tablet 1   ferrous sulfate  325 (65 FE) MG tablet TAKE 1 TABLET BY MOUTH EVERY DAY WITH BREAKFAST 100 tablet 1   gabapentin  (NEURONTIN ) 300 MG capsule  Take 1 capsule (300 mg total) by mouth 3 (three) times daily. Take  capsule three times a day 270 capsule 1   Insulin  Glargine-Lixisenatide (SOLIQUA ) 100-33 UNT-MCG/ML SOPN Inject 60 Units into the skin daily. 45 mL 1   Insulin  Pen Needle (DROPLET PEN NEEDLES) 32G X 4 MM MISC Inject 1 Act into the skin daily. 100 each 1   levocetirizine (XYZAL ) 5 MG tablet TAKE 1 TABLET EVERY DAY IN THE EVENING 90 tablet 1   metFORMIN  (GLUCOPHAGE ) 500 MG tablet TAKE 1 TABLET BY MOUTH TWICE A DAY WITH FOOD 180 tablet 1   pantoprazole  (PROTONIX ) 40 MG tablet TAKE 1 TABLET BY MOUTH TWICE A DAY 180 tablet 1   promethazine -dextromethorphan (PROMETHAZINE -DM) 6.25-15 MG/5ML syrup Take 5 mLs by mouth 3 (three) times daily as needed for cough. 118 mL 0   ranolazine  (RANEXA ) 500 MG 12 hr tablet TAKE 1 TABLET TWICE DAILY 180 tablet 1   tamsulosin  (FLOMAX ) 0.4 MG CAPS capsule Take 0.4 mg by mouth once.     traMADol  (ULTRAM ) 50 MG tablet TAKE 1 TABLET BY MOUTH EVERY 12 HOURS AS NEEDED. 60 tablet 0   VASCEPA  1 g capsule TAKE 2 CAPSULES BY MOUTH 2 TIMES DAILY. 360 capsule 1   albuterol  (VENTOLIN  HFA) 108 (90 Base) MCG/ACT inhaler Inhale 2 puffs into the lungs every 6 (six) hours as needed for wheezing or shortness of breath. (Patient not taking: Reported on 06/08/2024) 8 g 0   Cholecalciferol (VITAMIN D3) 25 MCG (1000 UT) CAPS Take by mouth. (Patient not taking: Reported on 06/08/2024)     Continuous Blood Gluc Receiver (FREESTYLE LIBRE 3 READER) DEVI 1 Act by Does not apply route daily. (Patient not taking: Reported on 06/08/2024) 1 each 3   Continuous Blood Gluc Sensor (FREESTYLE LIBRE 3 SENSOR) MISC 1 Act by Does not apply route daily. Place 1 sensor on the skin every 14 days. Use to check glucose continuously (Patient not taking: Reported on 06/08/2024) 2 each 5   Evolocumab  (REPATHA ) 140 MG/ML SOSY INJECT 140MG  UNDER THE SKIN EVERY 2 WEEKS (Patient not taking: Reported on 06/08/2024) 6 mL 1   fluticasone  (FLONASE ) 50 MCG/ACT nasal  spray SPRAY 2 SPRAYS INTO EACH NOSTRIL EVERY DAY (Patient not taking: Reported on 06/08/2024) 48 mL 1   Glucagon  1 MG/0.2ML SOAJ Inject 1 Act into the skin once as needed for up to 1 dose. (Patient not taking: Reported on 06/08/2024) 0.2 mL 3   indapamide  (LOZOL ) 1.25 MG tablet Take 1.25 mg by mouth daily.     isosorbide  mononitrate (IMDUR ) 120 MG 24 hr tablet Take 1 tablet (120 mg total) by mouth daily. 90 tablet 3   nitroGLYCERIN  (NITROSTAT ) 0.4 MG SL tablet Place 1 tablet (0.4 mg total) under the tongue every 5 (five) minutes as needed for chest pain. Do not exceed total of 3 dose in 15 minutes. 25 tablet 3   Spacer/Aero-Holding Chambers DEVI 1 each by Does not apply route as needed. (Patient not taking: Reported on 06/08/2024) 1 each 0   No current facility-administered medications for this visit.     Review of Systems    He denies chest pain, palpitations, dyspnea, pnd, orthopnea, n, v, dizziness, syncope, edema, weight gain, or early satiety. All other systems reviewed  and are otherwise negative except as noted above.   Physical Exam    VS:  BP 130/82   Pulse 67   Ht 5' 11 (1.803 m)   Wt 215 lb (97.5 kg)   SpO2 96%   BMI 29.99 kg/m   GEN: Well nourished, well developed, in no acute distress. HEENT: normal. Neck: Supple, no JVD, carotid bruits, or masses. Cardiac: RRR, no murmurs, rubs, or gallops. No clubbing, cyanosis, edema.  Radials/DP/PT 2+ and equal bilaterally.  Respiratory:  Respirations regular and unlabored, clear to auscultation bilaterally. GI: Soft, nontender, nondistended, BS + x 4. MS: no deformity or atrophy. Skin: warm and dry, no rash. Neuro:  Strength and sensation are intact. Psych: Normal affect.  Accessory Clinical Findings    ECG personally reviewed by me today -    - no EKG in office today.    Lab Results  Component Value Date   WBC 6.6 03/03/2024   HGB 13.1 03/03/2024   HCT 41.7 03/03/2024   MCV 74.5 (L) 03/03/2024   PLT 226.0 03/03/2024   Lab  Results  Component Value Date   CREATININE 1.04 03/03/2024   BUN 13 03/03/2024   NA 139 03/03/2024   K 4.6 03/03/2024   CL 102 03/03/2024   CO2 30 03/03/2024   Lab Results  Component Value Date   ALT 16 03/03/2024   AST 16 03/03/2024   ALKPHOS 52 03/03/2024   BILITOT 0.5 03/03/2024   Lab Results  Component Value Date   CHOL 124 03/03/2024   HDL 30.60 (L) 03/03/2024   LDLCALC 67 03/03/2024   LDLDIRECT 82.7 04/04/2013   TRIG 135.0 03/03/2024   CHOLHDL 4 03/03/2024    Lab Results  Component Value Date   HGBA1C 6.4 03/03/2024    Assessment & Plan    1. CAD: S/p BMS.  Residual RCA disease was unfavorable to PCI, managed medically. Nuclear stress tests in 2017, 2021 and 2023 were negative for ischemia. Stable with no anginal symptoms. No indication for ischemic evaluation. Continue aspirin , carvedilol , indapamide , Imdur , Ranexa , Lipitor, Vascepa , and Repatha .  2. Chronic diastolic heart failure: Echocardiogram in 2023 showed EF 65 to 70%, moderate asymmetric LVH, G1 DD, normal RV systolic function, no significant valvular disease. Euvolemic and well compensated on exam. Continue carvedilol , Jardiance .  No indication for loop diuretic at this time.  3. Hypertension: BP initially elevated upon arrival, improved with recheck, generally well controlled. Continue current antihypertensive regimen.   4. Hyperlipidemia: LDL was 67 in 02/2024.  Continue Lipitor, Vascepa , Repatha .  5. Type 2 diabetes: A1c was 6.4 in 02/2024.  Monitored and managed per PCP.  6. OSA: Adherent to CPAP.   7. Disposition: Follow-up in 6 months, sooner if needed.      Jude Norton, NP 06/08/2024, 2:00 PM

## 2024-06-11 ENCOUNTER — Other Ambulatory Visit: Payer: Self-pay | Admitting: Internal Medicine

## 2024-06-11 DIAGNOSIS — D508 Other iron deficiency anemias: Secondary | ICD-10-CM

## 2024-06-16 ENCOUNTER — Other Ambulatory Visit: Payer: Self-pay

## 2024-06-16 ENCOUNTER — Telehealth: Payer: Self-pay

## 2024-06-16 ENCOUNTER — Ambulatory Visit (INDEPENDENT_AMBULATORY_CARE_PROVIDER_SITE_OTHER): Payer: Medicare PPO

## 2024-06-16 ENCOUNTER — Encounter: Payer: Self-pay | Admitting: Internal Medicine

## 2024-06-16 ENCOUNTER — Other Ambulatory Visit: Payer: Self-pay | Admitting: Internal Medicine

## 2024-06-16 VITALS — Ht 71.0 in | Wt 215.0 lb

## 2024-06-16 DIAGNOSIS — E119 Type 2 diabetes mellitus without complications: Secondary | ICD-10-CM

## 2024-06-16 DIAGNOSIS — Z0000 Encounter for general adult medical examination without abnormal findings: Secondary | ICD-10-CM | POA: Diagnosis not present

## 2024-06-16 DIAGNOSIS — Z5941 Food insecurity: Secondary | ICD-10-CM | POA: Diagnosis not present

## 2024-06-16 DIAGNOSIS — I5042 Chronic combined systolic (congestive) and diastolic (congestive) heart failure: Secondary | ICD-10-CM

## 2024-06-16 DIAGNOSIS — E118 Type 2 diabetes mellitus with unspecified complications: Secondary | ICD-10-CM

## 2024-06-16 MED ORDER — EMPAGLIFLOZIN 25 MG PO TABS: 25.0000 mg | ORAL_TABLET | Freq: Every day | ORAL | 1 refills | Status: DC

## 2024-06-16 MED ORDER — FREESTYLE LIBRE 3 PLUS SENSOR MISC
1.0000 | 1 refills | Status: AC
Start: 2024-06-16 — End: ?

## 2024-06-16 NOTE — Progress Notes (Signed)
 Subjective:   [name] is a 73 y.o. who presents for a Medicare Wellness preventive visit.  As a reminder, Annual Wellness Visits don't include a physical exam, and some assessments may be limited, especially if this visit is performed virtually. We may recommend an in-person follow-up visit with your provider if needed.  Visit Complete: Virtual I connected with  [name] on 06/16/24 by a audio enabled telemedicine application and verified that I am speaking with the correct person using two identifiers.  Patient Location: Home  Provider Location: Office/Clinic  I discussed the limitations of evaluation and management by telemedicine. The patient expressed understanding and agreed to proceed.  Vital Signs: Because this visit was a virtual/telehealth visit, some criteria may be missing or patient reported. Any vitals not documented were not able to be obtained and vitals that have been documented are patient reported.  VideoDeclined- This patient declined Librarian, academic. Therefore the visit was completed with audio only.  Persons Participating in Visit: Patient.  AWV Questionnaire: No: Patient Medicare AWV questionnaire was not completed prior to this visit.  Cardiac Risk Factors include: advanced age (>18men, >77 women);hypertension;male gender;Other (see comment);diabetes mellitus;dyslipidemia, Risk factor comments: OSA, CHF, BPH, CKD     Objective:    Today's Vitals   06/16/24 1515  Weight: 215 lb (97.5 kg)  Height: 5' 11 (1.803 m)   Body mass index is 29.99 kg/m.     06/16/2024    3:33 PM 06/11/2023    2:01 PM 03/04/2021    3:34 PM 07/13/2019    2:07 PM 06/09/2019    3:53 PM 05/11/2018    2:43 PM 07/09/2017    9:00 PM  Advanced Directives  Does Patient Have a Medical Advance Directive? No No No No No No  No   Does patient want to make changes to medical advance directive?      Yes (ED - Information included in AVS)    Would  patient like information on creating a medical advance directive?  No - Patient declined No - Patient declined No - Patient declined  No - Patient declined   Yes (Inpatient - patient requests chaplain consult to create a medical advance directive)      Data saved with a previous flowsheet row definition    Current Medications (verified) Outpatient Encounter Medications as of 06/16/2024  Medication Sig   albuterol  (VENTOLIN  HFA) 108 (90 Base) MCG/ACT inhaler Inhale 2 puffs into the lungs every 6 (six) hours as needed for wheezing or shortness of breath.   ASPIRIN  LOW DOSE 81 MG tablet TAKE 1 TABLET (81 MG TOTAL) BY MOUTH DAILY. SWALLOW WHOLE.   atorvastatin  (LIPITOR) 80 MG tablet TAKE 1 TABLET BY MOUTH DAILY AT 6PM.   carvedilol  (COREG ) 12.5 MG tablet Take one (1) tablet by mouth ( 12.5 mg) twice daily.   Cholecalciferol (VITAMIN D3) 25 MCG (1000 UT) CAPS Take by mouth.   ferrous sulfate  325 (65 FE) MG tablet TAKE 1 TABLET BY MOUTH EVERY DAY WITH BREAKFAST   gabapentin  (NEURONTIN ) 300 MG capsule Take 1 capsule (300 mg total) by mouth 3 (three) times daily. Take  capsule three times a day   Glucagon  1 MG/0.2ML SOAJ Inject 1 Act into the skin once as needed for up to 1 dose.   indapamide  (LOZOL ) 1.25 MG tablet Take 1.25 mg by mouth daily.   Insulin  Glargine-Lixisenatide (SOLIQUA ) 100-33 UNT-MCG/ML SOPN Inject 60 Units into the skin daily.   Insulin  Pen Needle (  DROPLET PEN NEEDLES) 32G X 4 MM MISC Inject 1 Act into the skin daily.   isosorbide  mononitrate (IMDUR ) 120 MG 24 hr tablet Take 1 tablet (120 mg total) by mouth daily.   levocetirizine (XYZAL ) 5 MG tablet TAKE 1 TABLET EVERY DAY IN THE EVENING   metFORMIN  (GLUCOPHAGE ) 500 MG tablet TAKE 1 TABLET BY MOUTH TWICE A DAY WITH FOOD   nitroGLYCERIN  (NITROSTAT ) 0.4 MG SL tablet Place 1 tablet (0.4 mg total) under the tongue every 5 (five) minutes as needed for chest pain. Do not exceed total of 3 dose in 15 minutes.   pantoprazole  (PROTONIX ) 40 MG  tablet TAKE 1 TABLET BY MOUTH TWICE A DAY   promethazine -dextromethorphan (PROMETHAZINE -DM) 6.25-15 MG/5ML syrup Take 5 mLs by mouth 3 (three) times daily as needed for cough.   ranolazine  (RANEXA ) 500 MG 12 hr tablet TAKE 1 TABLET TWICE DAILY   tamsulosin  (FLOMAX ) 0.4 MG CAPS capsule Take 0.4 mg by mouth once.   traMADol  (ULTRAM ) 50 MG tablet TAKE 1 TABLET BY MOUTH EVERY 12 HOURS AS NEEDED.   VASCEPA  1 g capsule TAKE 2 CAPSULES BY MOUTH 2 TIMES DAILY.   [DISCONTINUED] empagliflozin  (JARDIANCE ) 25 MG TABS tablet TAKE 1 TABLET EVERY DAY   Continuous Blood Gluc Receiver (FREESTYLE LIBRE 3 READER) DEVI 1 Act by Does not apply route daily. (Patient not taking: Reported on 06/08/2024)   Continuous Blood Gluc Sensor (FREESTYLE LIBRE 3 SENSOR) MISC 1 Act by Does not apply route daily. Place 1 sensor on the skin every 14 days. Use to check glucose continuously (Patient not taking: Reported on 06/08/2024)   Evolocumab  (REPATHA ) 140 MG/ML SOSY INJECT 140MG  UNDER THE SKIN EVERY 2 WEEKS (Patient not taking: Reported on 06/16/2024)   fluticasone  (FLONASE ) 50 MCG/ACT nasal spray SPRAY 2 SPRAYS INTO EACH NOSTRIL EVERY DAY (Patient not taking: Reported on 06/16/2024)   Spacer/Aero-Holding Chambers DEVI 1 each by Does not apply route as needed. (Patient not taking: Reported on 06/16/2024)   No facility-administered encounter medications on file as of 06/16/2024.    Allergies (verified) Furosemide, Olmesartan , Amlodipine , Diamox [acetazolamide], and Lisinopril   History: Past Medical History:  Diagnosis Date   Arthritis    mild arthritis in hip   CAD (coronary artery disease)    CHF (congestive heart failure) (HCC)    Complication of anesthesia    lungs filled up with fluid    Diabetes mellitus    Type II   Glaucoma    Heart murmur    HTN (hypertension)    Hypercholesterolemia    LBP (low back pain)    Legally blind    Peripheral neuropathy    Sleep apnea    c-pap nightly   Staph infection     from back surgery   Status post insertion of drug eluting coronary artery stent    Trigger finger    Ulcer    Wears glasses    to protect cornea - legally blind   Past Surgical History:  Procedure Laterality Date   CHOLECYSTECTOMY  09/15/2012   Procedure: LAPAROSCOPIC CHOLECYSTECTOMY WITH INTRAOPERATIVE CHOLANGIOGRAM;  Surgeon: Donnice KATHEE Lunger, MD;  Location: [facility] OR;  Service: General;  Laterality: N/A;   COLONOSCOPY     EYE SURGERY Bilateral    cataracts   INGUINAL HERNIA REPAIR     right   LUMBAR FUSION     LUMBAR LAMINECTOMY/DECOMPRESSION MICRODISCECTOMY N/A 10/26/2015   Procedure: RIGHT AND CENTRAL LUMBAR LAMINECTOMY L3-4, RIGHT L5-S1 LATERAL RECESS DECOMPRESSION;  Surgeon: Lynwood BRAVO  Lucilla, MD;  Location: [facility] OR;  Service: Orthopedics;  Laterality: N/A;   NECK SURGERY     peptic ulcer dz surgery  pt was in his 43s   bleeding ulcer.    Prosthetic Cornea placement, Right eye  2007   [facility]   TRIGGER FINGER RELEASE Right 10/26/2015   Procedure: RELEASE TRIGGER FINGER RIGHT THUMB;  Surgeon: Lynwood FORBES Lucilla, MD;  Location: [facility] OR;  Service: Orthopedics;  Laterality: Right;   Family History  Problem Relation Age of Onset   Breast cancer Mother    Colon cancer Mother    Hypertension Mother    Diabetes Mother    Hypertension Father    Sleep apnea Sister    Diabetes Sister    Sleep apnea Brother    Diabetes Brother    Colon cancer Other        Elevated Risk for   Esophageal cancer Neg Hx    Stomach cancer Neg Hx    Rectal cancer Neg Hx    Social History   Socioeconomic History   Marital status: Married    Spouse name: [name]   Number of children: 3   Years of education: 12   Highest education level: Not on file  Occupational History   Occupation: disabled    Comment: blind  Tobacco Use   Smoking status: Never   Smokeless tobacco: Never  Vaping Use   Vaping status: Never Used  Substance and Sexual Activity   Alcohol use: No   Drug use: No   Sexual  activity: Not Currently  Other Topics Concern   Not on file  Social History Narrative   Occupation: disabled, blind   Married   Regular Exercise-no   Lives at home with his wife./2025   Right-handed.   2-3 cups caffeine per day.   Social Drivers of Health   Financial Resource Strain: Medium Risk (06/16/2024)   Overall Financial Resource Strain (CARDIA)    Difficulty of Paying Living Expenses: Somewhat hard  Food Insecurity: Food Insecurity Present (06/16/2024)   Hunger Vital Sign    Worried About Running Out of Food in the Last Year: Sometimes true    Ran Out of Food in the Last Year: Sometimes true  Transportation Needs: No Transportation Needs (06/16/2024)   PRAPARE - Administrator, Civil Service (Medical): No    Lack of Transportation (Non-Medical): No  Physical Activity: Inactive (06/16/2024)   Exercise Vital Sign    Days of Exercise per Week: 0 days    Minutes of Exercise per Session: 0 min  Stress: No Stress Concern Present (06/16/2024)   [employer] of Occupational Health - Occupational Stress Questionnaire    Feeling of Stress: Not at all  Social Connections: Socially Isolated (06/16/2024)   Social Connection and Isolation Panel    Frequency of Communication with Friends and Family: Once a week    Frequency of Social Gatherings with Friends and Family: Never    Attends Religious Services: Never    Database administrator or Organizations: No    Attends Engineer, structural: Never    Marital Status: Married    Tobacco Counseling Counseling given: Not Answered    Clinical Intake:  Pre-visit preparation completed: Yes  Pain : No/denies pain     BMI - recorded: 29.99 Nutritional Status: BMI 25 -29 Overweight Nutritional Risks: None Diabetes: Yes CBG done?: No Did pt. bring in CBG monitor from home?: No  Lab Results  Component Value Date  HGBA1C 6.4 03/03/2024   HGBA1C 6.9 (H) 06/23/2023   HGBA1C 6.8 (H) 01/20/2023     How  often do you need to have someone help you when you read instructions, pamphlets, or other written materials from your doctor or pharmacy?: 5 - Always (wife helps him read)  Interpreter Needed?: No  Information entered by :: Leyah Bocchino, RMA   Activities of Daily Living     06/16/2024    3:17 PM  In your present state of health, do you have any difficulty performing the following activities:  Hearing? 1  Comment Has some ringing in both ears  Vision? 0  Difficulty concentrating or making decisions? 0  Walking or climbing stairs? 0  Dressing or bathing? 0  Doing errands, shopping? 0  Comment wife drives him  Preparing Food and eating ? N  Using the Toilet? N  In the past six months, have you accidently leaked urine? N  Do you have problems with loss of bowel control? N  Managing your Medications? N  Managing your Finances? N  Housekeeping or managing your Housekeeping? N    Patient Care Team: Joshua Debby CROME, MD as PCP - General (Internal Medicine) Jeffrie Oneil BROCKS, MD as PCP - Cardiology (Cardiology) Buck Saucer, MD as Attending Physician (Neurology) Loreda Hacker, DPM as Consulting Physician (Podiatry) Szabat, Toribio BROCKS, [facility] (Inactive) (Pharmacist)  I have updated your Care Teams any recent Medical Services you may have received from other providers in the past year.     Assessment:   This is a routine wellness examination for [name].  Hearing/Vision screen Hearing Screening - Comments:: Has some ringing in both ears Vision Screening - Comments:: Patient is blind-per pt/ [facility]   Goals Addressed   None    Depression Screen     06/16/2024    3:36 PM 03/03/2024    3:21 PM 06/11/2023    1:51 PM 01/20/2023   10:44 AM 06/25/2022    1:58 PM 06/25/2022    1:57 PM 03/04/2021    3:31 PM  PHQ 2/9 Scores  PHQ - 2 Score 0 0 0 0 0 0 0  PHQ- 9 Score 1    0      Fall Risk     06/16/2024    3:33 PM 03/03/2024    3:21 PM 06/11/2023    1:58 PM 01/20/2023   10:43 AM  06/25/2022    1:57 PM  Fall Risk   Falls in the past year? 1 0 1 1 0  Number falls in past yr: 0 0 1 0   Injury with Fall? 0 0 1 1   Comment   Brusing followed by medical attention lost balanced   Risk for fall due to :  Impaired vision Impaired balance/gait;Impaired vision;Other (Comment) Impaired balance/gait;Impaired vision   Risk for fall due to: Comment   Legally Blind    Follow up Falls evaluation completed;Falls prevention discussed Falls evaluation completed Falls prevention discussed Falls evaluation completed     MEDICARE RISK AT HOME:  Medicare Risk at Home Any stairs in or around the home?: No If so, are there any without handrails?: No Home free of loose throw rugs in walkways, pet beds, electrical cords, etc?: Yes Adequate lighting in your home to reduce risk of falls?: Yes Life alert?: No Use of a cane, walker or w/c?: No Grab bars in the bathroom?: Yes Shower chair or bench in shower?: Yes Elevated toilet seat or a handicapped toilet?: Yes  TIMED UP AND GO:  Was the test performed?  No  Cognitive Function: Declined/Normal: No cognitive concerns noted by patient or family. Patient alert, oriented, able to answer questions appropriately and recall recent events. No signs of memory loss or confusion.    05/11/2018    3:31 PM  MMSE - Mini Mental State Exam  Not completed: Unable to complete        06/11/2023    2:01 PM  6CIT Screen  What Year? 0 points  What month? 0 points  What time? 0 points  Count back from 20 0 points  Months in reverse 4 points  Repeat phrase 8 points  Total Score 12 points    Immunizations Immunization History  Administered Date(s) Administered   Fluad Quad(high Dose 65+) 02/08/2020, 11/08/2020, 12/04/2021, 01/20/2023   Fluad Trivalent(High Dose 65+) 10/26/2023   Influenza Split 09/12/2012   Influenza Whole 10/28/2010, 10/28/2011   Influenza, High Dose Seasonal PF 08/25/2013, 08/21/2015, 12/09/2016, 02/24/2019   Influenza,  Quadrivalent, Recombinant, Inj, Pf 11/04/2017   Influenza,inj,Quad PF,6+ Mos 10/25/2014   PFIZER(Purple Top)SARS-COV-2 Vaccination 04/06/2020, 04/27/2020, 01/14/2021   Pneumococcal Conjugate-13 10/25/2014   Pneumococcal Polysaccharide-23 10/28/2010, 09/13/2012, 02/08/2020   Pneumococcal-Unspecified 07/22/2014   Td 10/22/2010   Tdap 05/08/2020    Screening Tests Health Maintenance  Topic Date Due   Zoster Vaccines- Shingrix  (1 of 2) Never done   COVID-19 Vaccine (4 - 2024-25 season) 08/23/2023   Medicare Annual Wellness (AWV)  06/10/2024   INFLUENZA VACCINE  07/22/2024   HEMOGLOBIN A1C  09/03/2024   Diabetic kidney evaluation - eGFR measurement  03/03/2025   Diabetic kidney evaluation - Urine ACR  03/03/2025   FOOT EXAM  03/03/2025   Colonoscopy  01/29/2026   DTaP/Tdap/Td (3 - Td or Tdap) 05/08/2030   Pneumococcal Vaccine: 50+ Years  Completed   Hepatitis C Screening  Completed   Hepatitis B Vaccines  Aged Out   HPV VACCINES  Aged Out   Meningococcal B Vaccine  Aged Out    Health Maintenance  Health Maintenance Due  Topic Date Due   Zoster Vaccines- Shingrix  (1 of 2) Never done   COVID-19 Vaccine (4 - 2024-25 season) 08/23/2023   Medicare Annual Wellness (AWV)  06/10/2024   Health Maintenance Items Addressed: See Nurse Notes at the end of this note  Additional Screening:  Vision Screening: Recommended annual ophthalmology exams for early detection of glaucoma and other disorders of the eye. Would you like a referral to an eye doctor? No    Dental Screening: Recommended annual dental exams for proper oral hygiene  Community Resource Referral / Chronic Care Management: CRR required this visit?  Yes   CCM required this visit?  No   Plan:    I have personally reviewed and noted the following in the patient's chart:   Medical and social history Use of alcohol, tobacco or illicit drugs  Current medications and supplements including opioid prescriptions. Patient  is not currently taking opioid prescriptions. Functional ability and status Nutritional status Physical activity Advanced directives List of other physicians Hospitalizations, surgeries, and ER visits in previous 12 months Vitals Screenings to include cognitive, depression, and falls Referrals and appointments  In addition, I have reviewed and discussed with patient certain preventive protocols, quality metrics, and best practice recommendations. A written personalized care plan for preventive services as well as general preventive health recommendations were provided to patient.   Kyaire Gruenewald L Gehrig Patras, CMA   06/16/2024   After Visit Summary: (MyChart) Due to this  being a telephonic visit, the after visit summary with patients personalized plan was offered to patient via MyChart   Notes: Patient is due for a shingrix  vaccine.  I have placed a community resource referral for food for patient today.  Patient is request a new prescription for a [facility] due, as he has not checked his blood sugars x 2 months.

## 2024-06-16 NOTE — Telephone Encounter (Signed)
 Patient is request a new prescription for a [facility] due, as he has not checked his blood sugars x 2 months.

## 2024-06-16 NOTE — Patient Instructions (Signed)
 [name] , Thank you for taking time out of your busy schedule to complete your Annual Wellness Visit with me. I enjoyed our conversation and look forward to speaking with you again next year. I, as well as your care team,  appreciate your ongoing commitment to your health goals. Please review the following plan we discussed and let me know if I can assist you in the future. Your Game plan/ To Do List   Follow up Visits: Next Medicare AWV with our clinical staff: 06/22/2025   Have you seen your provider in the last 6 months (3 months if uncontrolled diabetes)? Yes Next Office Visit with your provider: 07/04/2024.  Clinician Recommendations:  Aim for 30 minutes of exercise or brisk walking, 6-8 glasses of water , and 5 servings of fruits and vegetables each day. You are due for a Shingrix  vaccine.  I have placed a community resource referral for you, to see if they can help with food.  Please listen out for a call in regards to help[ with food.      This is a list of the screening recommended for you and due dates:  Health Maintenance  Topic Date Due   Zoster (Shingles) Vaccine (1 of 2) Never done   COVID-19 Vaccine (4 - 2024-25 season) 08/23/2023   Medicare Annual Wellness Visit  06/10/2024   Flu Shot  07/22/2024   Hemoglobin A1C  09/03/2024   Yearly kidney function blood test for diabetes  03/03/2025   Yearly kidney health urinalysis for diabetes  03/03/2025   Complete foot exam   03/03/2025   Colon Cancer Screening  01/29/2026   DTaP/Tdap/Td vaccine (3 - Td or Tdap) 05/08/2030   Pneumococcal Vaccine for age over 59  Completed   Hepatitis C Screening  Completed   Hepatitis B Vaccine  Aged Out   HPV Vaccine  Aged Out   Meningitis B Vaccine  Aged Out    Advanced directives: (Declined) Advance directive discussed with you today. Even though you declined this today, please call our office should you change your mind, and we can give you the proper paperwork for you to fill out. Advance  Care Planning is important because it:  [x]  Makes sure you receive the medical care that is consistent with your values, goals, and preferences  [x]  It provides guidance to your family and loved ones and reduces their decisional burden about whether or not they are making the right decisions based on your wishes.  Follow the link provided in your after visit summary or read over the paperwork we have mailed to you to help you started getting your Advance Directives in place. If you need assistance in completing these, please reach out to us  so that we can help you!  See attachments for Preventive Care and Fall Prevention Tips.

## 2024-06-18 DIAGNOSIS — G4733 Obstructive sleep apnea (adult) (pediatric): Secondary | ICD-10-CM | POA: Diagnosis not present

## 2024-06-20 ENCOUNTER — Telehealth: Payer: Self-pay

## 2024-06-20 NOTE — Progress Notes (Signed)
   Telephone encounter was:  Unsuccessful.  06/20/2024 Name: [name] MRN: [id number] DOB: 12-24-1950  Unsuccessful outbound call made today to assist with:  Food Insecurity  Outreach Attempt:  1st Attempt  No answer and unable to leave a message    [name] [facility]  [facility] Guide, Phone: [phone] Fax: [phone] Website: [email or web address]

## 2024-06-21 ENCOUNTER — Telehealth: Payer: Self-pay

## 2024-06-21 NOTE — Progress Notes (Signed)
   Telephone encounter was:  Unsuccessful.  06/21/2024 Name: [name] MRN: [id number] DOB: 02-20-1951  Unsuccessful outbound call made today to assist with:  Food Insecurity  Outreach Attempt:  1st Attempt  No answer and unable to leave a message   [name] [facility]  [facility] Guide, Phone: [phone] Fax: [phone] Website: [email or web address]

## 2024-06-23 ENCOUNTER — Telehealth: Payer: Self-pay

## 2024-06-23 NOTE — Progress Notes (Signed)
   Telephone encounter was:  Unsuccessful.  06/23/2024 Name: [name] MRN: [id number] DOB: 1951-12-01  Unsuccessful outbound call made today to assist with:  Food Insecurity  Outreach Attempt:  2nd Attempt  No answer and unable to leave a message   [name] [facility]  [facility] Guide, Phone: [phone] Fax: [phone] Website: [email or web address]

## 2024-06-27 ENCOUNTER — Telehealth: Payer: Self-pay

## 2024-06-27 NOTE — Progress Notes (Signed)
   Telephone encounter was:  Unsuccessful.  06/27/2024 Name: [name] MRN: [id number] DOB: [birth year]  Unsuccessful outbound call made today to assist with:  Food Insecurity  Outreach Attempt:  3rd Attempt.  Referral closed unable to contact patient.  No answer and unable to leave a message    [name] [facility] Guide, Phone: [phone] Fax: [phone] Website: [email or web address]

## 2024-06-28 ENCOUNTER — Ambulatory Visit (INDEPENDENT_AMBULATORY_CARE_PROVIDER_SITE_OTHER): Admitting: Podiatry

## 2024-06-28 ENCOUNTER — Encounter: Payer: Self-pay | Admitting: Podiatry

## 2024-06-28 DIAGNOSIS — E1142 Type 2 diabetes mellitus with diabetic polyneuropathy: Secondary | ICD-10-CM | POA: Diagnosis not present

## 2024-06-28 DIAGNOSIS — M79609 Pain in unspecified limb: Secondary | ICD-10-CM

## 2024-06-28 DIAGNOSIS — B351 Tinea unguium: Secondary | ICD-10-CM | POA: Diagnosis not present

## 2024-07-03 NOTE — Progress Notes (Signed)
  Subjective:  Patient ID: [name], male    DOB: 25-Sep-1951,  MRN: [id number]  [name] presents to clinic today for at risk foot care with history of diabetic neuropathy and painful thick toenails that are difficult to trim. Pain interferes with ambulation. Aggravating factors include wearing enclosed shoe gear. Pain is relieved with periodic professional debridement. He is accompanied by his wife on today's visit. Chief Complaint  Patient presents with   Diabetes    DFC IDDM A1C 6.4. Toenail trim. Discolored great toenails.   New problem(s): None.   PCP is Joshua Debby CROME, MD.  Allergies  Allergen Reactions   Furosemide     pancreatitis   Olmesartan  Other (See Comments)    hyperkalemia   Amlodipine  Swelling    LE EDEMA   Diamox [Acetazolamide] Itching    hives   Lisinopril Rash    Review of Systems: Negative except as noted in the HPI.  Objective: No changes noted in today's physical examination. There were no vitals filed for this visit. [name] is a pleasant 73 y.o. male in NAD. AAO x 3.  Vascular Examination: CFT <3 seconds b/l. DP pulses faintly palpable b/l. PT pulses nonpalpable b/l. Digital hair absent. Skin temperature gradient warm to warm b/l. No pain with calf compression. No ischemia or gangrene. No cyanosis or clubbing noted b/l.    Neurological Examination: Pt has subjective symptoms of neuropathy. Protective sensation diminished with 10g monofilament b/l.  Dermatological Examination: Pedal skin warm and supple b/l. No open wounds b/l. No interdigital macerations. Toenails 1-5 b/l thick, discolored, elongated with subungual debris and pain on dorsal palpation.  No corns, calluses nor porokeratotic lesions noted.  Musculoskeletal Examination: Muscle strength 3/5 to all LE muscle groups of left foot. Muscle strength 2/5 to all LE muscle groups of right foot.  Radiographs: None  Last HgA1c:      Latest Ref Rng & Units 03/03/2024    3:57 PM   Hemoglobin A1C  Hemoglobin-A1c 4.6 - 6.5 % 6.4     Assessment/Plan: 1. Pain due to onychomycosis of nail   2. Diabetic peripheral neuropathy associated with type 2 diabetes mellitus [facility])   Consent given for treatment. Patient examined. All patient's and/or POA's questions/concerns addressed on today's visit. Toenails 1-5 debrided in length and girth without incident. Continue foot and shoe inspections daily. Monitor blood glucose per PCP/Endocrinologist's recommendations. Continue soft, supportive shoe gear daily. Report any pedal injuries to medical professional. Call office if there are any questions/concerns. -Patient/POA to call should there be question/concern in the interim.   Return in about 3 months (around 09/28/2024).  Delon CROME Merlin, DPM       LOCATION: [address] [place] [address]                   Office [phone]   [facility] LOCATION: [address] [place], [place] [address] Office [phone]

## 2024-07-04 ENCOUNTER — Encounter: Payer: Self-pay | Admitting: Internal Medicine

## 2024-07-04 ENCOUNTER — Other Ambulatory Visit (HOSPITAL_COMMUNITY): Payer: Self-pay

## 2024-07-04 ENCOUNTER — Telehealth: Payer: Self-pay

## 2024-07-04 ENCOUNTER — Ambulatory Visit (INDEPENDENT_AMBULATORY_CARE_PROVIDER_SITE_OTHER): Admitting: Internal Medicine

## 2024-07-04 VITALS — BP 140/82 | HR 97 | Temp 98.1°F | Ht 71.0 in | Wt 214.2 lb

## 2024-07-04 DIAGNOSIS — I10 Essential (primary) hypertension: Secondary | ICD-10-CM

## 2024-07-04 DIAGNOSIS — M48062 Spinal stenosis, lumbar region with neurogenic claudication: Secondary | ICD-10-CM | POA: Diagnosis not present

## 2024-07-04 DIAGNOSIS — E0849 Diabetes mellitus due to underlying condition with other diabetic neurological complication: Secondary | ICD-10-CM

## 2024-07-04 DIAGNOSIS — I5042 Chronic combined systolic (congestive) and diastolic (congestive) heart failure: Secondary | ICD-10-CM | POA: Diagnosis not present

## 2024-07-04 DIAGNOSIS — D510 Vitamin B12 deficiency anemia due to intrinsic factor deficiency: Secondary | ICD-10-CM | POA: Diagnosis not present

## 2024-07-04 DIAGNOSIS — Z794 Long term (current) use of insulin: Secondary | ICD-10-CM

## 2024-07-04 DIAGNOSIS — E119 Type 2 diabetes mellitus without complications: Secondary | ICD-10-CM | POA: Diagnosis not present

## 2024-07-04 DIAGNOSIS — Z23 Encounter for immunization: Secondary | ICD-10-CM | POA: Insufficient documentation

## 2024-07-04 DIAGNOSIS — M150 Primary generalized (osteo)arthritis: Secondary | ICD-10-CM | POA: Insufficient documentation

## 2024-07-04 DIAGNOSIS — I2510 Atherosclerotic heart disease of native coronary artery without angina pectoris: Secondary | ICD-10-CM

## 2024-07-04 DIAGNOSIS — E785 Hyperlipidemia, unspecified: Secondary | ICD-10-CM

## 2024-07-04 LAB — CBC WITH DIFFERENTIAL/PLATELET
Basophils Absolute: 0 K/uL (ref 0.0–0.1)
Basophils Relative: 0.5 % (ref 0.0–3.0)
Eosinophils Absolute: 0.1 K/uL (ref 0.0–0.7)
Eosinophils Relative: 2.3 % (ref 0.0–5.0)
HCT: 41.5 % (ref 39.0–52.0)
Hemoglobin: 13.3 g/dL (ref 13.0–17.0)
Lymphocytes Relative: 33.7 % (ref 12.0–46.0)
Lymphs Abs: 2.2 K/uL (ref 0.7–4.0)
MCHC: 32.1 g/dL (ref 30.0–36.0)
MCV: 73.1 fl — ABNORMAL LOW (ref 78.0–100.0)
Monocytes Absolute: 0.5 K/uL (ref 0.1–1.0)
Monocytes Relative: 7.3 % (ref 3.0–12.0)
Neutro Abs: 3.6 K/uL (ref 1.4–7.7)
Neutrophils Relative %: 56.2 % (ref 43.0–77.0)
Platelets: 219 K/uL (ref 150.0–400.0)
RBC: 5.67 Mil/uL (ref 4.22–5.81)
RDW: 15.1 % (ref 11.5–15.5)
WBC: 6.5 K/uL (ref 4.0–10.5)

## 2024-07-04 LAB — BASIC METABOLIC PANEL WITH GFR
BUN: 16 mg/dL (ref 6–23)
CO2: 23 meq/L (ref 19–32)
Calcium: 9 mg/dL (ref 8.4–10.5)
Chloride: 103 meq/L (ref 96–112)
Creatinine, Ser: 1.2 mg/dL (ref 0.40–1.50)
GFR: 59.42 mL/min — ABNORMAL LOW (ref >60.00)
Glucose, Bld: 115 mg/dL — ABNORMAL HIGH (ref 70–99)
Potassium: 4 meq/L (ref 3.5–5.1)
Sodium: 137 meq/L (ref 135–145)

## 2024-07-04 LAB — HEMOGLOBIN A1C: Hgb A1c MFr Bld: 6.6 % — ABNORMAL HIGH (ref 4.6–6.5)

## 2024-07-04 LAB — TSH: TSH: 2.48 u[IU]/mL (ref 0.35–5.50)

## 2024-07-04 MED ORDER — ATORVASTATIN CALCIUM 80 MG PO TABS: ORAL_TABLET | ORAL | 1 refills | Status: DC

## 2024-07-04 MED ORDER — GABAPENTIN 300 MG PO CAPS: 300.0000 mg | ORAL_CAPSULE | Freq: Three times a day (TID) | ORAL | 1 refills | Status: AC

## 2024-07-04 MED ORDER — ASPIRIN 81 MG PO TBEC: 81.0000 mg | DELAYED_RELEASE_TABLET | Freq: Every day | ORAL | 1 refills | Status: AC

## 2024-07-04 MED ORDER — SHINGRIX 50 MCG/0.5ML IM SUSR: 0.5000 mL | Freq: Once | INTRAMUSCULAR | 1 refills | Status: AC

## 2024-07-04 MED ORDER — TRAMADOL HCL ER 100 MG PO TB24: 100.0000 mg | ORAL_TABLET | Freq: Every day | ORAL | 1 refills | Status: DC

## 2024-07-04 MED ORDER — CYANOCOBALAMIN 1000 MCG/ML IJ SOLN
1000.0000 ug | Freq: Once | INTRAMUSCULAR | Status: AC
  Administered 2024-07-04: 1000 ug via INTRAMUSCULAR

## 2024-07-04 MED ORDER — REPATHA 140 MG/ML ~~LOC~~ SOSY: 140.0000 mg | PREFILLED_SYRINGE | SUBCUTANEOUS | 1 refills | Status: AC

## 2024-07-04 NOTE — Progress Notes (Unsigned)
 Subjective:  Patient ID: [name], male    DOB: 1951/04/30  Age: 73 y.o. MRN: [id number]  CC: Hypertension, Osteoarthritis, and Diabetes   HPI SAYID MOLL presents for ***  Outpatient Medications Prior to Visit  Medication Sig Dispense Refill   albuterol  (VENTOLIN  HFA) 108 (90 Base) MCG/ACT inhaler Inhale 2 puffs into the lungs every 6 (six) hours as needed for wheezing or shortness of breath. 8 g 0   carvedilol  (COREG ) 12.5 MG tablet Take one (1) tablet by mouth ( 12.5 mg) twice daily. 180 tablet 3   Cholecalciferol (VITAMIN D3) 25 MCG (1000 UT) CAPS Take by mouth.     Continuous Blood Gluc Receiver (FREESTYLE LIBRE 3 READER) DEVI 1 Act by Does not apply route daily. 1 each 3   Continuous Glucose Sensor (FREESTYLE LIBRE 3 PLUS SENSOR) MISC Apply 1 Act topically every 14 (fourteen) days. Change sensor every 15 days. 6 each 1   empagliflozin  (JARDIANCE ) 25 MG TABS tablet Take 1 tablet (25 mg total) by mouth daily. 90 tablet 1   ferrous sulfate  325 (65 FE) MG tablet TAKE 1 TABLET BY MOUTH EVERY DAY WITH BREAKFAST 100 tablet 1   fluticasone  (FLONASE ) 50 MCG/ACT nasal spray SPRAY 2 SPRAYS INTO EACH NOSTRIL EVERY DAY 48 mL 1   Glucagon  1 MG/0.2ML SOAJ Inject 1 Act into the skin once as needed for up to 1 dose. 0.2 mL 3   indapamide  (LOZOL ) 1.25 MG tablet Take 1.25 mg by mouth daily.     Insulin  Glargine-Lixisenatide (SOLIQUA ) 100-33 UNT-MCG/ML SOPN Inject 60 Units into the skin daily. 45 mL 1   Insulin  Pen Needle (DROPLET PEN NEEDLES) 32G X 4 MM MISC Inject 1 Act into the skin daily. 100 each 1   isosorbide  mononitrate (IMDUR ) 120 MG 24 hr tablet Take 1 tablet (120 mg total) by mouth daily. 90 tablet 3   levocetirizine (XYZAL ) 5 MG tablet TAKE 1 TABLET EVERY DAY IN THE EVENING 90 tablet 1   metFORMIN  (GLUCOPHAGE ) 500 MG tablet TAKE 1 TABLET BY MOUTH TWICE A DAY WITH FOOD 180 tablet 1   nitroGLYCERIN  (NITROSTAT ) 0.4 MG SL tablet Place 1 tablet (0.4 mg total) under the tongue every 5  (five) minutes as needed for chest pain. Do not exceed total of 3 dose in 15 minutes. 25 tablet 3   pantoprazole  (PROTONIX ) 40 MG tablet TAKE 1 TABLET BY MOUTH TWICE A DAY 180 tablet 1   promethazine -dextromethorphan (PROMETHAZINE -DM) 6.25-15 MG/5ML syrup Take 5 mLs by mouth 3 (three) times daily as needed for cough. 118 mL 0   ranolazine  (RANEXA ) 500 MG 12 hr tablet TAKE 1 TABLET TWICE DAILY 180 tablet 1   Spacer/Aero-Holding Chambers DEVI 1 each by Does not apply route as needed. 1 each 0   tamsulosin  (FLOMAX ) 0.4 MG CAPS capsule Take 0.4 mg by mouth once.     VASCEPA  1 g capsule TAKE 2 CAPSULES BY MOUTH 2 TIMES DAILY. 360 capsule 1   ASPIRIN  LOW DOSE 81 MG tablet TAKE 1 TABLET (81 MG TOTAL) BY MOUTH DAILY. SWALLOW WHOLE. 90 tablet 1   atorvastatin  (LIPITOR) 80 MG tablet TAKE 1 TABLET BY MOUTH DAILY AT 6PM. 90 tablet 1   Evolocumab  (REPATHA ) 140 MG/ML SOSY INJECT 140MG  UNDER THE SKIN EVERY 2 WEEKS 6 mL 1   gabapentin  (NEURONTIN ) 300 MG capsule Take 1 capsule (300 mg total) by mouth 3 (three) times daily. Take  capsule three times a day 270 capsule 1   traMADol  (ULTRAM )  50 MG tablet TAKE 1 TABLET BY MOUTH EVERY 12 HOURS AS NEEDED. 60 tablet 0   No facility-administered medications prior to visit.    ROS Review of Systems  Objective:  BP (!) 140/82 (BP Location: Left Arm, Patient Position: Sitting, Cuff Size: Normal)   Pulse 97   Temp 98.1 F (36.7 C) (Oral)   Ht 5' 11 (1.803 m)   Wt 214 lb 3.2 oz (97.2 kg)   SpO2 95%   BMI 29.87 kg/m   BP Readings from Last 3 Encounters:  07/04/24 (!) 140/82  06/08/24 130/82  04/15/24 100/64    Wt Readings from Last 3 Encounters:  07/04/24 214 lb 3.2 oz (97.2 kg)  06/16/24 215 lb (97.5 kg)  06/08/24 215 lb (97.5 kg)    Physical Exam Cardiovascular:     Rate and Rhythm: Normal rate. Occasional Extrasystoles are present.    Lab Results  Component Value Date   WBC 6.6 03/03/2024   HGB 13.1 03/03/2024   HCT 41.7 03/03/2024   PLT  226.0 03/03/2024   GLUCOSE 80 03/03/2024   CHOL 124 03/03/2024   TRIG 135.0 03/03/2024   HDL 30.60 (L) 03/03/2024   LDLDIRECT 82.7 04/04/2013   LDLCALC 67 03/03/2024   ALT 16 03/03/2024   AST 16 03/03/2024   NA 139 03/03/2024   K 4.6 03/03/2024   CL 102 03/03/2024   CREATININE 1.04 03/03/2024   BUN 13 03/03/2024   CO2 30 03/03/2024   TSH 2.83 06/23/2023   PSA 3.71 03/03/2024   INR 0.95 08/17/2010   HGBA1C 6.4 03/03/2024   MICROALBUR 1.4 03/03/2024    VAS US  ABI WITH/WO TBI Result Date: 06/08/2023  LOWER EXTREMITY DOPPLER STUDY Patient Name:  KIRTIS CHALLIS  Date of Exam:   06/08/2023 Medical Rec #: [id number]      Accession #:    [id number] Date of Birth: 1951-07-14      Patient Gender: M Patient Age:   48 years Exam Location:  [facility] Procedure:      VAS US  ABI WITH/WO TBI Referring Phys: DELON MERLIN --------------------------------------------------------------------------------  Indications: Claudication. High Risk Factors: Hypertension, Diabetes, no history of smoking, coronary                    artery disease. Other Factors: CHF, OSA, Diabetic Neuropathy, CKD stage 2.  Performing Technologist: Norleen Buck RVT, RDMS  Examination Guidelines: A complete evaluation includes at minimum, Doppler waveform signals and systolic blood pressure reading at the level of bilateral brachial, anterior tibial, and posterior tibial arteries, when vessel segments are accessible. Bilateral testing is considered an integral part of a complete examination. Photoelectric Plethysmograph (PPG) waveforms and toe systolic pressure readings are included as required and additional duplex testing as needed. Limited examinations for reoccurring indications may be performed as noted.  ABI Findings: +---------+------------------+-----+-----------+--------+ Right    Rt Pressure (mmHg)IndexWaveform   Comment  +---------+------------------+-----+-----------+--------+ Brachial 160                                         +---------+------------------+-----+-----------+--------+ PTA      112               0.70 monophasic          +---------+------------------+-----+-----------+--------+ DP       110               0.69  multiphasic         +---------+------------------+-----+-----------+--------+ Great Toe104               0.65 Normal              +---------+------------------+-----+-----------+--------+ +---------+------------------+-----+-----------+-------+ Left     Lt Pressure (mmHg)IndexWaveform   Comment +---------+------------------+-----+-----------+-------+ Brachial 160                                       +---------+------------------+-----+-----------+-------+ PTA      156               0.98 monophasic         +---------+------------------+-----+-----------+-------+ DP       151               0.94 multiphasic        +---------+------------------+-----+-----------+-------+ Great Toe118               0.74 Normal             +---------+------------------+-----+-----------+-------+ +-------+-----------+-----------+------------+------------+ ABI/TBIToday's ABIToday's TBIPrevious ABIPrevious TBI +-------+-----------+-----------+------------+------------+ Right  0.70       0.65                                +-------+-----------+-----------+------------+------------+ Left   0.98       0.74                                +-------+-----------+-----------+------------+------------+  Arterial wall calcification precludes accurate ankle pressures and ABIs. PPG tracings display appropriate pulsatility.  Summary: Right: Resting right ankle-brachial index indicates moderate right lower extremity arterial disease. The right toe-brachial index is abnormal. Left: Resting left ankle-brachial index is within normal range. The left toe-brachial index is normal. *See table(s) above for measurements and observations.  Electronically signed by Gaile New MD on 06/08/2023 at 4:08:55 PM.    Final     Assessment & Plan:  Primary hypertension -     EKG 12-Lead -     Basic metabolic panel with GFR; Future -     TSH; Future  Atherosclerosis of native coronary artery of native heart without angina pectoris -     Atorvastatin  Calcium ; Take 1 tablet by mouth daily at 6pm.  Dispense: 90 tablet; Refill: 1 -     Repatha ; Inject 140 mg into the skin every 14 (fourteen) days.  Dispense: 6 mL; Refill: 1 -     Aspirin ; Take 1 tablet (81 mg total) by mouth 5 (five) times daily. SWALLOW WHOLE.  Dispense: 90 tablet; Refill: 1  Hyperlipidemia LDL goal <70 -     Atorvastatin  Calcium ; Take 1 tablet by mouth daily at 6pm.  Dispense: 90 tablet; Refill: 1 -     TSH; Future -     Repatha ; Inject 140 mg into the skin every 14 (fourteen) days.  Dispense: 6 mL; Refill: 1  Other diabetic neurological complication associated with diabetes mellitus due to underlying condition (HCC) -     Gabapentin ; Take 1 capsule (300 mg total) by mouth 3 (three) times daily. Take  capsule three times a day  Dispense: 270 capsule; Refill: 1  Chronic combined systolic and diastolic congestive heart failure (HCC)  Insulin -requiring or dependent type II diabetes mellitus (HCC) -     Basic metabolic panel  with GFR; Future -     Hemoglobin A1c; Future  Vitamin B12 deficiency anemia due to intrinsic factor deficiency -     CBC with Differential/Platelet; Future -     Cyanocobalamin   Dyslipidemia, goal LDL below 70 -     Repatha ; Inject 140 mg into the skin every 14 (fourteen) days.  Dispense: 6 mL; Refill: 1  Neurogenic claudication due to lumbar spinal stenosis -     traMADol  HCl ER; Take 1 tablet (100 mg total) by mouth daily.  Dispense: 90 tablet; Refill: 1  Spinal stenosis, lumbar region, with neurogenic claudication -     traMADol  HCl ER; Take 1 tablet (100 mg total) by mouth daily.  Dispense: 90 tablet; Refill: 1  Primary osteoarthritis involving multiple joints -      traMADol  HCl ER; Take 1 tablet (100 mg total) by mouth daily.  Dispense: 90 tablet; Refill: 1  Need for prophylactic vaccination and inoculation against varicella -     Shingrix ; Inject 0.5 mLs into the muscle once for 1 dose.  Dispense: 0.5 mL; Refill: 1  Coronary artery disease involving native coronary artery of native heart without angina pectoris -     Aspirin ; Take 1 tablet (81 mg total) by mouth 5 (five) times daily. SWALLOW WHOLE.  Dispense: 90 tablet; Refill: 1     Follow-up: Return in about 6 months (around 01/04/2025).  Debby Molt, MD

## 2024-07-04 NOTE — Telephone Encounter (Signed)
 Pharmacy Patient Advocate Encounter   Received notification from CoverMyMeds that prior authorization for Repatha  140MG /ML syringes  is required/requested.   Insurance verification completed.   The patient is insured through [facility] .   Per test claim: PA required; PA started via CoverMyMeds. KEY [id number] . Waiting for clinical questions to populate.

## 2024-07-04 NOTE — Patient Instructions (Signed)

## 2024-07-05 NOTE — Telephone Encounter (Signed)
 PLEASE BE ADVISED Clinical questions have been answered and PA submitted.TO PLAN. PA currently Pending.

## 2024-07-06 NOTE — Telephone Encounter (Signed)
 Pharmacy Patient Advocate Encounter  Received notification from [employer] that Prior Authorization for Repatha  140MG /ML syringes  has been APPROVED from 07/05/2024 to 01/05/2025   PA #/Case ID/Reference #: [id number]

## 2024-07-07 ENCOUNTER — Ambulatory Visit: Payer: Self-pay | Admitting: Internal Medicine

## 2024-07-18 DIAGNOSIS — G4733 Obstructive sleep apnea (adult) (pediatric): Secondary | ICD-10-CM | POA: Diagnosis not present

## 2024-07-27 ENCOUNTER — Ambulatory Visit (INDEPENDENT_AMBULATORY_CARE_PROVIDER_SITE_OTHER): Admitting: Emergency Medicine

## 2024-07-27 ENCOUNTER — Encounter: Payer: Self-pay | Admitting: Emergency Medicine

## 2024-07-27 ENCOUNTER — Ambulatory Visit (INDEPENDENT_AMBULATORY_CARE_PROVIDER_SITE_OTHER)

## 2024-07-27 ENCOUNTER — Ambulatory Visit: Payer: Self-pay | Admitting: Emergency Medicine

## 2024-07-27 VITALS — BP 120/72 | HR 72 | Temp 98.5°F | Ht 71.0 in | Wt 212.0 lb

## 2024-07-27 DIAGNOSIS — D492 Neoplasm of unspecified behavior of bone, soft tissue, and skin: Secondary | ICD-10-CM | POA: Diagnosis not present

## 2024-07-27 DIAGNOSIS — I10 Essential (primary) hypertension: Secondary | ICD-10-CM

## 2024-07-27 DIAGNOSIS — R059 Cough, unspecified: Secondary | ICD-10-CM | POA: Diagnosis not present

## 2024-07-27 DIAGNOSIS — M19011 Primary osteoarthritis, right shoulder: Secondary | ICD-10-CM | POA: Diagnosis not present

## 2024-07-27 DIAGNOSIS — R0989 Other specified symptoms and signs involving the circulatory and respiratory systems: Secondary | ICD-10-CM | POA: Diagnosis not present

## 2024-07-27 NOTE — Assessment & Plan Note (Signed)
 New physical finding per patient and wife Nontender hard growth Recommend x-rays today and soft tissue ultrasound when available We will follow-up again after that

## 2024-07-27 NOTE — Progress Notes (Addendum)
 [name] 73 y.o.   Chief Complaint  Patient presents with   Cyst    Patient here for knot on his right collar bone. Patient states its growing and moving close to his throat, he states he is having to constantly clearly his throat. Patient states he does not know when it came up, wife noticed it last night and states it had to be some time within the last couple months. Not painful to the touch     HISTORY OF PRESENT ILLNESS: Acute problem visit today.  Patient of Dr. Debby Molt. This is a 73 y.o. male complaining of mass to right medial collarbone that started a couple months ago.  Not painful. No other associated symptoms.  HPI   Prior to Admission medications   Medication Sig Start Date End Date Taking? Authorizing Provider  albuterol  (VENTOLIN  HFA) 108 (90 Base) MCG/ACT inhaler Inhale 2 puffs into the lungs every 6 (six) hours as needed for wheezing or shortness of breath. 04/15/24  Yes Elnor Lauraine BRAVO, NP  aspirin  EC (ASPIRIN  LOW DOSE) 81 MG tablet Take 1 tablet (81 mg total) by mouth 5 (five) times daily. SWALLOW WHOLE. 07/04/24  Yes Molt Debby CROME, MD  atorvastatin  (LIPITOR) 80 MG tablet Take 1 tablet by mouth daily at 6pm. 07/04/24  Yes Molt Debby CROME, MD  carvedilol  (COREG ) 12.5 MG tablet Take one (1) tablet by mouth ( 12.5 mg) twice daily. 11/30/23  Yes Swinyer, Rosaline HERO, NP  Cholecalciferol (VITAMIN D3) 25 MCG (1000 UT) CAPS Take by mouth.   Yes [provider]  Continuous Blood Gluc Receiver (FREESTYLE LIBRE 3 READER) DEVI 1 Act by Does not apply route daily. 01/29/23  Yes Molt Debby CROME, MD  Continuous Glucose Sensor (FREESTYLE LIBRE 3 PLUS SENSOR) MISC Apply 1 Act topically every 14 (fourteen) days. Change sensor every 15 days. 06/16/24  Yes Molt Debby CROME, MD  empagliflozin  (JARDIANCE ) 25 MG TABS tablet Take 1 tablet (25 mg total) by mouth daily. 06/16/24  Yes Molt Debby CROME, MD  Evolocumab  (REPATHA ) 140 MG/ML SOSY Inject 140 mg into the skin every 14  (fourteen) days. 07/04/24  Yes Molt Debby CROME, MD  ferrous sulfate  325 (65 FE) MG tablet TAKE 1 TABLET BY MOUTH EVERY DAY WITH BREAKFAST 06/15/24  Yes Molt Debby CROME, MD  fluticasone  (FLONASE ) 50 MCG/ACT nasal spray SPRAY 2 SPRAYS INTO EACH NOSTRIL EVERY DAY 01/30/24  Yes Molt Debby CROME, MD  gabapentin  (NEURONTIN ) 300 MG capsule Take 1 capsule (300 mg total) by mouth 3 (three) times daily. Take  capsule three times a day 07/04/24  Yes Molt Debby CROME, MD  Glucagon  1 MG/0.2ML SOAJ Inject 1 Act into the skin once as needed for up to 1 dose. 03/11/22  Yes Molt Debby CROME, MD  indapamide  (LOZOL ) 1.25 MG tablet Take 1.25 mg by mouth daily. 01/12/21  Yes [provider]  Insulin  Glargine-Lixisenatide (SOLIQUA ) 100-33 UNT-MCG/ML SOPN Inject 60 Units into the skin daily. 04/06/24  Yes Molt Debby CROME, MD  Insulin  Pen Needle (DROPLET PEN NEEDLES) 32G X 4 MM MISC Inject 1 Act into the skin daily. 04/06/24  Yes Molt Debby CROME, MD  isosorbide  mononitrate (IMDUR ) 120 MG 24 hr tablet Take 1 tablet (120 mg total) by mouth daily. 06/08/24  Yes Monge, Damien BROCKS, NP  levocetirizine (XYZAL ) 5 MG tablet TAKE 1 TABLET EVERY DAY IN THE EVENING 06/01/24  Yes Molt Debby CROME, MD  metFORMIN  (GLUCOPHAGE ) 500 MG tablet TAKE 1 TABLET BY MOUTH TWICE  A DAY WITH FOOD 02/22/24  Yes Joshua Debby CROME, MD  nitroGLYCERIN  (NITROSTAT ) 0.4 MG SL tablet Place 1 tablet (0.4 mg total) under the tongue every 5 (five) minutes as needed for chest pain. Do not exceed total of 3 dose in 15 minutes. 06/08/24  Yes Monge, Damien BROCKS, NP  pantoprazole  (PROTONIX ) 40 MG tablet TAKE 1 TABLET BY MOUTH TWICE A DAY 02/22/24  Yes Joshua Debby CROME, MD  promethazine -dextromethorphan (PROMETHAZINE -DM) 6.25-15 MG/5ML syrup Take 5 mLs by mouth 3 (three) times daily as needed for cough. 04/15/24  Yes Elnor Lauraine BRAVO, NP  ranolazine  (RANEXA ) 500 MG 12 hr tablet TAKE 1 TABLET TWICE DAILY 11/24/23  Yes Joshua Debby CROME, MD  Spacer/Aero-Holding Chambers DEVI 1 each by Does not apply  route as needed. 04/15/24  Yes Elnor Lauraine BRAVO, NP  tamsulosin  (FLOMAX ) 0.4 MG CAPS capsule Take 0.4 mg by mouth once.   Yes [provider]  traMADol  (ULTRAM -ER) 100 MG 24 hr tablet Take 1 tablet (100 mg total) by mouth daily. 07/04/24  Yes Joshua Debby CROME, MD  VASCEPA  1 g capsule TAKE 2 CAPSULES BY MOUTH 2 TIMES DAILY. 03/03/24  Yes Joshua Debby CROME, MD    Allergies  Allergen Reactions   Furosemide     pancreatitis   Olmesartan  Other (See Comments)    hyperkalemia   Amlodipine  Swelling    LE EDEMA   Diamox [Acetazolamide] Itching    hives   Lisinopril Rash    Patient Active Problem List   Diagnosis Date Noted   Primary osteoarthritis involving multiple joints 07/04/2024   Need for prophylactic vaccination and inoculation against varicella 07/04/2024   Need for immunization against influenza 10/26/2023   Encounter for general adult medical examination with abnormal findings 10/26/2023   Carpal tunnel syndrome, right 10/26/2023   Iron  deficiency anemia secondary to inadequate dietary iron  intake 06/27/2023   Flu vaccine need 01/24/2023   OSA on CPAP 01/24/2023   Insulin -requiring or dependent type II diabetes mellitus (HCC) 01/12/2023   CKD (chronic kidney disease) stage 2, GFR 60-89 ml/min 07/29/2021   Age-related cognitive decline 03/07/2021   Chronic bacterial conjunctivitis of right eye 05/26/2019   Primary open angle glaucoma (POAG) of both eyes, severe stage 03/17/2019   Seasonal allergic rhinitis due to pollen 07/22/2017   Chronic diastolic heart failure (HCC) 11/18/2016   Spinal stenosis, lumbar region, with neurogenic claudication 10/26/2015    Class: Chronic   Neurogenic claudication due to lumbar spinal stenosis 10/26/2015   Sleep apnea 10/24/2015   Lumbar radiculopathy 03/21/2015   Diabetic neuropathy (HCC) 02/13/2014   Postlaminectomy syndrome, lumbar region 02/13/2014   Claudication in peripheral vascular disease (HCC) 12/28/2013   POAG (primary open-angle  glaucoma) 12/19/2013   GERD (gastroesophageal reflux disease) 08/25/2013   Pure hyperglyceridemia 08/25/2013   Advanced stage glaucoma 01/05/2013   HTN (hypertension) 01/05/2013   Diabetic macular edema of right eye (HCC) 11/25/2012   Neurogenic bladder 10/01/2012   Dyslipidemia, goal LDL below 70 04/02/2012   A87 deficiency anemia 03/31/2012   BPH (benign prostatic hyperplasia) 03/31/2012   Obesity 05/26/2011   ED (erectile dysfunction) 05/26/2011   Coronary atherosclerosis 08/28/2010   Congestive heart failure (HCC) 08/28/2010    Past Medical History:  Diagnosis Date   Arthritis    mild arthritis in hip   CAD (coronary artery disease)    CHF (congestive heart failure) (HCC)    Complication of anesthesia    lungs filled up with fluid    Diabetes mellitus  Type II   Glaucoma    Heart murmur    HTN (hypertension)    Hypercholesterolemia    LBP (low back pain)    Legally blind    Peripheral neuropathy    Sleep apnea    c-pap nightly   Staph infection    from back surgery   Status post insertion of drug eluting coronary artery stent    Trigger finger    Ulcer    Wears glasses    to protect cornea - legally blind    Past Surgical History:  Procedure Laterality Date   CHOLECYSTECTOMY  09/15/2012   Procedure: LAPAROSCOPIC CHOLECYSTECTOMY WITH INTRAOPERATIVE CHOLANGIOGRAM;  Surgeon: Donnice KATHEE Lunger, MD;  Location: [facility] OR;  Service: General;  Laterality: N/A;   COLONOSCOPY     EYE SURGERY Bilateral    cataracts   INGUINAL HERNIA REPAIR     right   LUMBAR FUSION     LUMBAR LAMINECTOMY/DECOMPRESSION MICRODISCECTOMY N/A 10/26/2015   Procedure: RIGHT AND CENTRAL LUMBAR LAMINECTOMY L3-4, RIGHT L5-S1 LATERAL RECESS DECOMPRESSION;  Surgeon: Lynwood FORBES Better, MD;  Location: [facility] OR;  Service: Orthopedics;  Laterality: N/A;   NECK SURGERY     peptic ulcer dz surgery  pt was in his 23s   bleeding ulcer.    Prosthetic Cornea placement, Right eye  2007   [facility]    TRIGGER FINGER RELEASE Right 10/26/2015   Procedure: RELEASE TRIGGER FINGER RIGHT THUMB;  Surgeon: Lynwood FORBES Better, MD;  Location: [facility] OR;  Service: Orthopedics;  Laterality: Right;    Social History   Socioeconomic History   Marital status: Married    Spouse name: [name]   Number of children: 3   Years of education: 12   Highest education level: Not on file  Occupational History   Occupation: disabled    Comment: blind  Tobacco Use   Smoking status: Never   Smokeless tobacco: Never  Vaping Use   Vaping status: Never Used  Substance and Sexual Activity   Alcohol use: No   Drug use: No   Sexual activity: Not Currently  Other Topics Concern   Not on file  Social History Narrative   Occupation: disabled, blind   Married   Regular Exercise-no   Lives at home with his wife./2025   Right-handed.   2-3 cups caffeine per day.   Social Drivers of Health   Financial Resource Strain: Medium Risk (06/16/2024)   Overall Financial Resource Strain (CARDIA)    Difficulty of Paying Living Expenses: Somewhat hard  Food Insecurity: Food Insecurity Present (06/16/2024)   Hunger Vital Sign    Worried About Running Out of Food in the Last Year: Sometimes true    Ran Out of Food in the Last Year: Sometimes true  Transportation Needs: No Transportation Needs (06/16/2024)   PRAPARE - Administrator, Civil Service (Medical): No    Lack of Transportation (Non-Medical): No  Physical Activity: Inactive (06/16/2024)   Exercise Vital Sign    Days of Exercise per Week: 0 days    Minutes of Exercise per Session: 0 min  Stress: No Stress Concern Present (06/16/2024)   [employer] of Occupational Health - Occupational Stress Questionnaire    Feeling of Stress: Not at all  Social Connections: Socially Isolated (06/16/2024)   Social Connection and Isolation Panel    Frequency of Communication with Friends and Family: Once a week    Frequency of Social Gatherings with Friends and Family:  Never  Attends Religious Services: Never    Active Member of Clubs or Organizations: No    Attends Banker Meetings: Never    Marital Status: Married  Catering manager Violence: Not At Risk (06/16/2024)   Humiliation, Afraid, Rape, and Kick questionnaire    Fear of Current or Ex-Partner: No    Emotionally Abused: No    Physically Abused: No    Sexually Abused: No    Family History  Problem Relation Age of Onset   Breast cancer Mother    Colon cancer Mother    Hypertension Mother    Diabetes Mother    Hypertension Father    Sleep apnea Sister    Diabetes Sister    Sleep apnea Brother    Diabetes Brother    Colon cancer Other        Elevated Risk for   Esophageal cancer Neg Hx    Stomach cancer Neg Hx    Rectal cancer Neg Hx      Review of Systems  Constitutional: Negative.  Negative for chills and fever.  HENT:  Negative for congestion and sore throat.   Eyes:        Legally blind  Respiratory: Negative.  Negative for cough and shortness of breath.        Constant clearing of throat  Cardiovascular: Negative.  Negative for chest pain and palpitations.  Gastrointestinal:  Negative for abdominal pain, nausea and vomiting.  Genitourinary: Negative.  Negative for dysuria and hematuria.  Skin: Negative.  Negative for rash.  Neurological:  Negative for dizziness and headaches.    Vitals:   07/27/24 0940  BP: 120/72  Pulse: 72  Temp: 98.5 F (36.9 C)  SpO2: 97%    Physical Exam Vitals reviewed.  Constitutional:      Appearance: Normal appearance.  HENT:     Head: Normocephalic.     Mouth/Throat:     Mouth: Mucous membranes are moist.     Pharynx: Oropharynx is clear.  Cardiovascular:     Rate and Rhythm: Normal rate and regular rhythm.     Pulses: Normal pulses.     Heart sounds: Normal heart sounds.  Pulmonary:     Effort: Pulmonary effort is normal.     Breath sounds: Normal breath sounds.  Musculoskeletal:     Cervical back: No  tenderness.  Lymphadenopathy:     Cervical: No cervical adenopathy.  Skin:    General: Skin is warm and dry.     Capillary Refill: Capillary refill takes less than 2 seconds.     Comments: Hard nontender growth medial right clavicle  Neurological:     General: No focal deficit present.     Mental Status: He is alert and oriented to person, place, and time.  Psychiatric:        Mood and Affect: Mood normal.        Behavior: Behavior normal.    DG Chest 2 View Result Date: 07/27/2024 CLINICAL DATA:  Cough and congestion. EXAM: CHEST - 2 VIEW COMPARISON:  07/09/2017. FINDINGS: Trachea is midline. Heart size stable. Lungs are somewhat low in volume but clear. No pleural fluid. Degenerative changes in the spine. IMPRESSION: No acute findings. Electronically Signed   By: Newell Eke M.D.   On: 07/27/2024 11:40   DG Clavicle Right Result Date: 07/27/2024 CLINICAL DATA:  Knot on right clavicle. EXAM: RIGHT CLAVICLE - 2+ VIEWS COMPARISON:  None Available. FINDINGS: No acute osseous abnormality. Degenerative changes in the right acromioclavicular and  glenohumeral joints. Small subacromial spur. Visualized right chest is grossly unremarkable. IMPRESSION: 1. No acute findings. 2. Right acromioclavicular and glenohumeral joint osteoarthritis. 3. Small subacromial spur. Electronically Signed   By: Newell Eke M.D.   On: 07/27/2024 11:40     ASSESSMENT & PLAN: A total of 33 minutes was spent with the patient and counseling/coordination of care regarding preparing for this visit, review of most recent office visit notes, review of all medications and chronic medical conditions under management, differential diagnosis of abnormal clavicular growth and need for imaging, review of x-ray images done today, prognosis, documentation and need for follow-up with PCP.  Problem List Items Addressed This Visit       Cardiovascular and Mediastinum   HTN (hypertension)   BP Readings from Last 3 Encounters:   07/27/24 120/72  07/04/24 (!) 140/82  06/08/24 130/82  Well-controlled hypertension Continues carvedilol  12.5 mg twice a day, isosorbide  mononitrate 120 mg daily, and Lozol  1.25 mg daily         Musculoskeletal and Integument   Abnormal growth of clavicle - Primary   New physical finding per patient and wife Nontender hard growth Recommend x-rays today and soft tissue ultrasound when available We will follow-up again after that      Relevant Orders   DG Clavicle Right   DG Chest 2 View   US  Soft Tissue Head/Neck (NON-THYROID )   Patient Instructions  Health Maintenance After Age 13 After age 69, you are at a higher risk for certain long-term diseases and infections as well as injuries from falls. Falls are a major cause of broken bones and head injuries in people who are older than age 46. Getting regular preventive care can help to keep you healthy and well. Preventive care includes getting regular testing and making lifestyle changes as recommended by your health care provider. Talk with your health care provider about: Which screenings and tests you should have. A screening is a test that checks for a disease when you have no symptoms. A diet and exercise plan that is right for you. What should I know about screenings and tests to prevent falls? Screening and testing are the best ways to find a health problem early. Early diagnosis and treatment give you the best chance of managing medical conditions that are common after age 85. Certain conditions and lifestyle choices may make you more likely to have a fall. Your health care provider may recommend: Regular vision checks. Poor vision and conditions such as cataracts can make you more likely to have a fall. If you wear glasses, make sure to get your prescription updated if your vision changes. Medicine review. Work with your health care provider to regularly review all of the medicines you are taking, including over-the-counter  medicines. Ask your health care provider about any side effects that may make you more likely to have a fall. Tell your health care provider if any medicines that you take make you feel dizzy or sleepy. Strength and balance checks. Your health care provider may recommend certain tests to check your strength and balance while standing, walking, or changing positions. Foot health exam. Foot pain and numbness, as well as not wearing proper footwear, can make you more likely to have a fall. Screenings, including: Osteoporosis screening. Osteoporosis is a condition that causes the bones to get weaker and break more easily. Blood pressure screening. Blood pressure changes and medicines to control blood pressure can make you feel dizzy. Depression screening. You may be more  likely to have a fall if you have a fear of falling, feel depressed, or feel unable to do activities that you used to do. Alcohol use screening. Using too much alcohol can affect your balance and may make you more likely to have a fall. Follow these instructions at home: Lifestyle Do not drink alcohol if: Your health care provider tells you not to drink. If you drink alcohol: Limit how much you have to: 0-1 drink a day for women. 0-2 drinks a day for men. Know how much alcohol is in your drink. In the [place] one drink equals one 12 oz bottle of beer (355 mL), one 5 oz glass of wine (148 mL), or one 1 oz glass of hard liquor (44 mL). Do not use any products that contain nicotine or tobacco. These products include cigarettes, chewing tobacco, and vaping devices, such as e-cigarettes. If you need help quitting, ask your health care provider. Activity  Follow a regular exercise program to stay fit. This will help you maintain your balance. Ask your health care provider what types of exercise are appropriate for you. If you need a cane or walker, use it as recommended by your health care provider. Wear supportive shoes that have nonskid  soles. Safety  Remove any tripping hazards, such as rugs, cords, and clutter. Install safety equipment such as grab bars in bathrooms and safety rails on stairs. Keep rooms and walkways well-lit. General instructions Talk with your health care provider about your risks for falling. Tell your health care provider if: You fall. Be sure to tell your health care provider about all falls, even ones that seem minor. You feel dizzy, tiredness (fatigue), or off-balance. Take over-the-counter and prescription medicines only as told by your health care provider. These include supplements. Eat a healthy diet and maintain a healthy weight. A healthy diet includes low-fat dairy products, low-fat (lean) meats, and fiber from whole grains, beans, and lots of fruits and vegetables. Stay current with your vaccines. Schedule regular health, dental, and eye exams. Summary Having a healthy lifestyle and getting preventive care can help to protect your health and wellness after age 24. Screening and testing are the best way to find a health problem early and help you avoid having a fall. Early diagnosis and treatment give you the best chance for managing medical conditions that are more common for people who are older than age 15. Falls are a major cause of broken bones and head injuries in people who are older than age 46. Take precautions to prevent a fall at home. Work with your health care provider to learn what changes you can make to improve your health and wellness and to prevent falls. This information is not intended to replace advice given to you by your health care provider. Make sure you discuss any questions you have with your health care provider. Document Revised: 04/29/2021 Document Reviewed: 04/29/2021 Elsevier Patient Education  2024 Elsevier Inc.    Emil Schaumann, MD [facility]

## 2024-07-27 NOTE — Assessment & Plan Note (Signed)
 BP Readings from Last 3 Encounters:  07/27/24 120/72  07/04/24 (!) 140/82  06/08/24 130/82  Well-controlled hypertension Continues carvedilol  12.5 mg twice a day, isosorbide  mononitrate 120 mg daily, and Lozol  1.25 mg daily

## 2024-07-27 NOTE — Patient Instructions (Signed)
 Health Maintenance After Age 75 After age 4, you are at a higher risk for certain long-term diseases and infections as well as injuries from falls. Falls are a major cause of broken bones and head injuries in people who are older than age 47. Getting regular preventive care can help to keep you healthy and well. Preventive care includes getting regular testing and making lifestyle changes as recommended by your health care provider. Talk with your health care provider about: Which screenings and tests you should have. A screening is a test that checks for a disease when you have no symptoms. A diet and exercise plan that is right for you. What should I know about screenings and tests to prevent falls? Screening and testing are the best ways to find a health problem early. Early diagnosis and treatment give you the best chance of managing medical conditions that are common after age 37. Certain conditions and lifestyle choices may make you more likely to have a fall. Your health care provider may recommend: Regular vision checks. Poor vision and conditions such as cataracts can make you more likely to have a fall. If you wear glasses, make sure to get your prescription updated if your vision changes. Medicine review. Work with your health care provider to regularly review all of the medicines you are taking, including over-the-counter medicines. Ask your health care provider about any side effects that may make you more likely to have a fall. Tell your health care provider if any medicines that you take make you feel dizzy or sleepy. Strength and balance checks. Your health care provider may recommend certain tests to check your strength and balance while standing, walking, or changing positions. Foot health exam. Foot pain and numbness, as well as not wearing proper footwear, can make you more likely to have a fall. Screenings, including: Osteoporosis screening. Osteoporosis is a condition that causes  the bones to get weaker and break more easily. Blood pressure screening. Blood pressure changes and medicines to control blood pressure can make you feel dizzy. Depression screening. You may be more likely to have a fall if you have a fear of falling, feel depressed, or feel unable to do activities that you used to do. Alcohol use screening. Using too much alcohol can affect your balance and may make you more likely to have a fall. Follow these instructions at home: Lifestyle Do not drink alcohol if: Your health care provider tells you not to drink. If you drink alcohol: Limit how much you have to: 0-1 drink a day for women. 0-2 drinks a day for men. Know how much alcohol is in your drink. In the [place] one drink equals one 12 oz bottle of beer (355 mL), one 5 oz glass of wine (148 mL), or one 1 oz glass of hard liquor (44 mL). Do not use any products that contain nicotine or tobacco. These products include cigarettes, chewing tobacco, and vaping devices, such as e-cigarettes. If you need help quitting, ask your health care provider. Activity  Follow a regular exercise program to stay fit. This will help you maintain your balance. Ask your health care provider what types of exercise are appropriate for you. If you need a cane or walker, use it as recommended by your health care provider. Wear supportive shoes that have nonskid soles. Safety  Remove any tripping hazards, such as rugs, cords, and clutter. Install safety equipment such as grab bars in bathrooms and safety rails on stairs. Keep rooms and walkways  well-lit. General instructions Talk with your health care provider about your risks for falling. Tell your health care provider if: You fall. Be sure to tell your health care provider about all falls, even ones that seem minor. You feel dizzy, tiredness (fatigue), or off-balance. Take over-the-counter and prescription medicines only as told by your health care provider. These include  supplements. Eat a healthy diet and maintain a healthy weight. A healthy diet includes low-fat dairy products, low-fat (lean) meats, and fiber from whole grains, beans, and lots of fruits and vegetables. Stay current with your vaccines. Schedule regular health, dental, and eye exams. Summary Having a healthy lifestyle and getting preventive care can help to protect your health and wellness after age 11. Screening and testing are the best way to find a health problem early and help you avoid having a fall. Early diagnosis and treatment give you the best chance for managing medical conditions that are more common for people who are older than age 28. Falls are a major cause of broken bones and head injuries in people who are older than age 48. Take precautions to prevent a fall at home. Work with your health care provider to learn what changes you can make to improve your health and wellness and to prevent falls. This information is not intended to replace advice given to you by your health care provider. Make sure you discuss any questions you have with your health care provider. Document Revised: 04/29/2021 Document Reviewed: 04/29/2021 Elsevier Patient Education  2024 [employer].

## 2024-07-28 ENCOUNTER — Ambulatory Visit
Admission: RE | Admit: 2024-07-28 | Discharge: 2024-07-28 | Disposition: A | Discharge status: Home / Self Care | Source: Ambulatory Visit | Attending: Emergency Medicine | Admitting: Emergency Medicine

## 2024-07-28 DIAGNOSIS — D492 Neoplasm of unspecified behavior of bone, soft tissue, and skin: Secondary | ICD-10-CM

## 2024-07-28 DIAGNOSIS — R221 Localized swelling, mass and lump, neck: Secondary | ICD-10-CM | POA: Diagnosis not present

## 2024-08-05 DIAGNOSIS — Z4420 Encounter for fitting and adjustment of artificial eye, unspecified: Secondary | ICD-10-CM | POA: Diagnosis not present

## 2024-08-12 ENCOUNTER — Telehealth: Payer: Self-pay

## 2024-08-12 NOTE — Telephone Encounter (Signed)
 Copied from CRM [id number]. Topic: Appointments - Appointment Info/Confirmation >> Aug 12, 2024 11:06 AM Rosina BIRCH wrote: Patient/patient representative is calling for information regarding an appointment. Patient wife called wanting clarification on visits that the patient has. The wife stated the patient can get physicals done according to his insurance but he is not getting them done or it is not being coded right. The patient wife stated he is getting all the things done for a physical when he comes in  CB [phone]

## 2024-08-17 ENCOUNTER — Telehealth: Payer: Self-pay

## 2024-08-17 NOTE — Telephone Encounter (Signed)
 Copied from CRM #[id number]. Topic: General - Call Back - No Documentation >> Aug 17, 2024  4:13 PM [name] wrote: Reason for CRM: Patient and wife are calling Jaz back, Please contact the patient back.

## 2024-08-17 NOTE — Telephone Encounter (Signed)
 Unable to reach patient. LMTRC

## 2024-08-18 DIAGNOSIS — G4733 Obstructive sleep apnea (adult) (pediatric): Secondary | ICD-10-CM | POA: Diagnosis not present

## 2024-08-19 DIAGNOSIS — R3912 Poor urinary stream: Secondary | ICD-10-CM | POA: Diagnosis not present

## 2024-08-22 ENCOUNTER — Other Ambulatory Visit: Payer: Self-pay | Admitting: Internal Medicine

## 2024-08-25 NOTE — Telephone Encounter (Signed)
 Unable to reach the patient/ patients representative LMTRC

## 2024-08-25 NOTE — Telephone Encounter (Signed)
 Duplicate noted. I have reaches out to the patients wife today was unable to get her. LMTRC. Will close this note and continue with documentation in the other note.

## 2024-08-26 ENCOUNTER — Other Ambulatory Visit: Payer: Self-pay | Admitting: Internal Medicine

## 2024-08-26 DIAGNOSIS — K219 Gastro-esophageal reflux disease without esophagitis: Secondary | ICD-10-CM

## 2024-08-26 DIAGNOSIS — E118 Type 2 diabetes mellitus with unspecified complications: Secondary | ICD-10-CM

## 2024-08-29 NOTE — Telephone Encounter (Signed)
 Patient has been made aware of the date of his last AWV he gave a verbal understanding.

## 2024-09-15 ENCOUNTER — Other Ambulatory Visit: Payer: Self-pay | Admitting: Internal Medicine

## 2024-09-15 DIAGNOSIS — I2510 Atherosclerotic heart disease of native coronary artery without angina pectoris: Secondary | ICD-10-CM

## 2024-09-15 NOTE — Telephone Encounter (Signed)
 Copied from CRM [id number]. Topic: Clinical - Medication Refill >> Sep 15, 2024  4:29 PM [name] wrote: Medication: ranolazine  (RANEXA ) 500 MG 12 hr tablet  Has the patient contacted their pharmacy? Yes (Agent: If no, request that the patient contact the pharmacy for the refill. If patient does not wish to contact the pharmacy document the reason why and proceed with request.) (Agent: If yes, when and what did the pharmacy advise?)  This is the patient's preferred pharmacy:  [id number] - [place], [place] - [address] AT [place] [facility] [address] [place] [address] Phone: [phone] Fax: [phone]  Is this the correct pharmacy for this prescription? Yes If no, delete pharmacy and type the correct one.   Has the prescription been filled recently? No  Is the patient out of the medication? Yes  Has the patient been seen for an appointment in the last year OR does the patient have an upcoming appointment? Yes  Can we respond through MyChart? Yes  Agent: Please be advised that Rx refills may take up to 3 business days. We ask that you follow-up with your pharmacy.

## 2024-09-26 MED ORDER — RANOLAZINE ER 500 MG PO TB12
500.0000 mg | ORAL_TABLET | Freq: Two times a day (BID) | ORAL | 1 refills | Status: AC
Start: 2024-09-26 — End: ?

## 2024-10-12 ENCOUNTER — Ambulatory Visit: Admitting: Podiatry

## 2024-10-12 ENCOUNTER — Encounter: Payer: Self-pay | Admitting: Podiatry

## 2024-10-12 DIAGNOSIS — M79609 Pain in unspecified limb: Secondary | ICD-10-CM

## 2024-10-12 DIAGNOSIS — B351 Tinea unguium: Secondary | ICD-10-CM | POA: Diagnosis not present

## 2024-10-12 DIAGNOSIS — E1142 Type 2 diabetes mellitus with diabetic polyneuropathy: Secondary | ICD-10-CM | POA: Diagnosis not present

## 2024-10-18 DIAGNOSIS — G4733 Obstructive sleep apnea (adult) (pediatric): Secondary | ICD-10-CM | POA: Diagnosis not present

## 2024-10-18 LAB — OPHTHALMOLOGY REPORT-SCANNED

## 2024-10-19 NOTE — Progress Notes (Signed)
  Subjective:  Patient ID: [name], male    DOB: March 27, 1951,  MRN: [id number]  [name] presents to clinic today for at risk foot care with history of diabetic neuropathy and painful mycotic toenails x 10 which interfere with daily activities. Pain is relieved with periodic professional debridement. His wife is present during today's visit. Chief Complaint  Patient presents with   Diabetes    DFC IDDM A1C 6.6. Toenail trim. LOV with PCP 07/04/24.   New problem(s): None.   PCP is Joshua Debby CROME, MD.  Allergies  Allergen Reactions   Furosemide     pancreatitis   Olmesartan  Other (See Comments)    hyperkalemia   Amlodipine  Swelling    LE EDEMA   Diamox [Acetazolamide] Itching    hives   Lisinopril Rash    Review of Systems: Negative except as noted in the HPI.  Objective: No changes noted in today's physical examination. There were no vitals filed for this visit. [name] is a pleasant 73 y.o. male in NAD. AAO x 3.  Vascular Examination: CFT <3 seconds b/l. DP pulses faintly palpable b/l. PT pulses nonpalpable b/l. Digital hair absent. Skin temperature gradient warm to warm b/l. No pain with calf compression. No ischemia or gangrene. No cyanosis or clubbing noted b/l.    Neurological Examination: Pt has subjective symptoms of neuropathy. Protective sensation diminished with 10g monofilament b/l.  Dermatological Examination: Pedal skin warm and supple b/l. No open wounds b/l. No interdigital macerations. Toenails 1-5 b/l thick, discolored, elongated with subungual debris and pain on dorsal palpation.  No corns, calluses nor porokeratotic lesions noted.  Musculoskeletal Examination: Muscle strength 3/5 to all LE muscle groups of left foot. Muscle strength 2/5 to all LE muscle groups of right foot.  Radiographs: None Assessment/Plan: 1. Pain due to onychomycosis of nail   2. Diabetic peripheral neuropathy associated with type 2 diabetes mellitus (HCC)    Patient was evaluated and treated. All patient's and/or POA's questions/concerns addressed on today's visit. Mycotic toenails 1-5 b/l debrided in length and girth without incident.  Treatment was provided by assistant Andrez Manchester under my supervision. Continue daily foot inspections and monitor blood glucose per PCP/Endocrinologist's recommendations.Continue soft, supportive shoe gear daily. Report any pedal injuries to medical professional. Call office if there are any quesitons/concerns. -Patient/POA to call should there be question/concern in the interim.   Return in about 3 months (around 01/12/2025).  Delon CROME Merlin, DPM      [place] LOCATION: [address], [place] [address]                   Office [phone]   [facility] LOCATION: [address] [place], [place] [address] Office [phone]

## 2024-10-24 ENCOUNTER — Encounter: Payer: Self-pay | Admitting: Radiology

## 2024-10-30 ENCOUNTER — Other Ambulatory Visit: Payer: Self-pay | Admitting: Internal Medicine

## 2024-10-30 DIAGNOSIS — E119 Type 2 diabetes mellitus without complications: Secondary | ICD-10-CM

## 2024-10-30 DIAGNOSIS — T868419 Corneal transplant failure, unspecified eye: Secondary | ICD-10-CM | POA: Insufficient documentation

## 2024-11-02 ENCOUNTER — Other Ambulatory Visit: Payer: Self-pay | Admitting: Internal Medicine

## 2024-11-02 DIAGNOSIS — E119 Type 2 diabetes mellitus without complications: Secondary | ICD-10-CM

## 2024-11-02 DIAGNOSIS — E118 Type 2 diabetes mellitus with unspecified complications: Secondary | ICD-10-CM

## 2024-11-05 ENCOUNTER — Other Ambulatory Visit: Payer: Self-pay | Admitting: Nurse Practitioner

## 2024-11-05 DIAGNOSIS — I2510 Atherosclerotic heart disease of native coronary artery without angina pectoris: Secondary | ICD-10-CM

## 2024-11-20 ENCOUNTER — Other Ambulatory Visit: Payer: Self-pay | Admitting: Internal Medicine

## 2024-11-20 DIAGNOSIS — J301 Allergic rhinitis due to pollen: Secondary | ICD-10-CM

## 2024-11-21 ENCOUNTER — Other Ambulatory Visit: Payer: Self-pay | Admitting: Nurse Practitioner

## 2024-11-21 ENCOUNTER — Other Ambulatory Visit (HOSPITAL_COMMUNITY): Payer: Self-pay

## 2024-11-21 DIAGNOSIS — I5032 Chronic diastolic (congestive) heart failure: Secondary | ICD-10-CM

## 2024-11-21 MED ORDER — CARVEDILOL 12.5 MG PO TABS
12.5000 mg | ORAL_TABLET | Freq: Two times a day (BID) | ORAL | 3 refills | Status: DC
  Filled 2024-11-21: qty 180, 90d supply, fill #0

## 2024-11-22 ENCOUNTER — Encounter: Payer: Self-pay | Admitting: Internal Medicine

## 2024-11-22 ENCOUNTER — Encounter (HOSPITAL_BASED_OUTPATIENT_CLINIC_OR_DEPARTMENT_OTHER): Payer: Self-pay

## 2024-11-22 ENCOUNTER — Other Ambulatory Visit (HOSPITAL_COMMUNITY): Payer: Self-pay

## 2024-11-25 ENCOUNTER — Other Ambulatory Visit: Payer: Self-pay

## 2024-11-25 MED ORDER — ICOSAPENT ETHYL 1 G PO CAPS: 2.0000 g | ORAL_CAPSULE | Freq: Two times a day (BID) | ORAL | 0 refills | Status: AC

## 2024-11-29 ENCOUNTER — Other Ambulatory Visit: Payer: Self-pay | Admitting: Nurse Practitioner

## 2024-11-29 DIAGNOSIS — I5032 Chronic diastolic (congestive) heart failure: Secondary | ICD-10-CM

## 2024-12-01 MED ORDER — CARVEDILOL 12.5 MG PO TABS: 12.5000 mg | ORAL_TABLET | Freq: Two times a day (BID) | ORAL | 2 refills | Status: AC

## 2024-12-17 ENCOUNTER — Other Ambulatory Visit: Payer: Self-pay | Admitting: Internal Medicine

## 2024-12-17 DIAGNOSIS — I5042 Chronic combined systolic (congestive) and diastolic (congestive) heart failure: Secondary | ICD-10-CM

## 2024-12-17 DIAGNOSIS — E118 Type 2 diabetes mellitus with unspecified complications: Secondary | ICD-10-CM

## 2024-12-21 ENCOUNTER — Other Ambulatory Visit: Payer: Self-pay | Admitting: Internal Medicine

## 2024-12-21 DIAGNOSIS — I2510 Atherosclerotic heart disease of native coronary artery without angina pectoris: Secondary | ICD-10-CM

## 2024-12-21 DIAGNOSIS — E785 Hyperlipidemia, unspecified: Secondary | ICD-10-CM

## 2024-12-28 DIAGNOSIS — H179 Unspecified corneal scar and opacity: Secondary | ICD-10-CM | POA: Insufficient documentation

## 2025-01-04 ENCOUNTER — Encounter: Payer: Self-pay | Admitting: Internal Medicine

## 2025-01-04 ENCOUNTER — Ambulatory Visit: Admitting: Internal Medicine

## 2025-01-04 VITALS — BP 132/60 | HR 68 | Temp 98.4°F | Ht 71.0 in | Wt 210.8 lb

## 2025-01-04 DIAGNOSIS — E785 Hyperlipidemia, unspecified: Secondary | ICD-10-CM | POA: Diagnosis not present

## 2025-01-04 DIAGNOSIS — N401 Enlarged prostate with lower urinary tract symptoms: Secondary | ICD-10-CM | POA: Diagnosis not present

## 2025-01-04 DIAGNOSIS — I10 Essential (primary) hypertension: Secondary | ICD-10-CM | POA: Diagnosis not present

## 2025-01-04 DIAGNOSIS — Z0001 Encounter for general adult medical examination with abnormal findings: Secondary | ICD-10-CM

## 2025-01-04 DIAGNOSIS — G4733 Obstructive sleep apnea (adult) (pediatric): Secondary | ICD-10-CM

## 2025-01-04 DIAGNOSIS — M150 Primary generalized (osteo)arthritis: Secondary | ICD-10-CM

## 2025-01-04 DIAGNOSIS — Z794 Long term (current) use of insulin: Secondary | ICD-10-CM

## 2025-01-04 DIAGNOSIS — Z0000 Encounter for general adult medical examination without abnormal findings: Secondary | ICD-10-CM | POA: Diagnosis not present

## 2025-01-04 DIAGNOSIS — M961 Postlaminectomy syndrome, not elsewhere classified: Secondary | ICD-10-CM

## 2025-01-04 DIAGNOSIS — M48062 Spinal stenosis, lumbar region with neurogenic claudication: Secondary | ICD-10-CM

## 2025-01-04 DIAGNOSIS — Z23 Encounter for immunization: Secondary | ICD-10-CM | POA: Diagnosis not present

## 2025-01-04 DIAGNOSIS — D510 Vitamin B12 deficiency anemia due to intrinsic factor deficiency: Secondary | ICD-10-CM | POA: Diagnosis not present

## 2025-01-04 DIAGNOSIS — E11311 Type 2 diabetes mellitus with unspecified diabetic retinopathy with macular edema: Secondary | ICD-10-CM

## 2025-01-04 DIAGNOSIS — R3912 Poor urinary stream: Secondary | ICD-10-CM | POA: Diagnosis not present

## 2025-01-04 DIAGNOSIS — B3749 Other urogenital candidiasis: Secondary | ICD-10-CM

## 2025-01-04 DIAGNOSIS — I5032 Chronic diastolic (congestive) heart failure: Secondary | ICD-10-CM | POA: Diagnosis not present

## 2025-01-04 DIAGNOSIS — E119 Type 2 diabetes mellitus without complications: Secondary | ICD-10-CM

## 2025-01-04 DIAGNOSIS — N182 Chronic kidney disease, stage 2 (mild): Secondary | ICD-10-CM

## 2025-01-04 DIAGNOSIS — E0849 Diabetes mellitus due to underlying condition with other diabetic neurological complication: Secondary | ICD-10-CM

## 2025-01-04 DIAGNOSIS — M5416 Radiculopathy, lumbar region: Secondary | ICD-10-CM

## 2025-01-04 LAB — CBC WITH DIFFERENTIAL/PLATELET
Basophils Absolute: 0 K/uL (ref 0.0–0.1)
Basophils Relative: 0.3 % (ref 0.0–3.0)
Eosinophils Absolute: 0.2 K/uL (ref 0.0–0.7)
Eosinophils Relative: 3.7 % (ref 0.0–5.0)
HCT: 41.8 % (ref 39.0–52.0)
Hemoglobin: 13.4 g/dL (ref 13.0–17.0)
Lymphocytes Relative: 33 % (ref 12.0–46.0)
Lymphs Abs: 1.7 K/uL (ref 0.7–4.0)
MCHC: 32.1 g/dL (ref 30.0–36.0)
MCV: 72.6 fl — ABNORMAL LOW (ref 78.0–100.0)
Monocytes Absolute: 0.3 K/uL (ref 0.1–1.0)
Monocytes Relative: 6.8 % (ref 3.0–12.0)
Neutro Abs: 2.8 K/uL (ref 1.4–7.7)
Neutrophils Relative %: 56.2 % (ref 43.0–77.0)
Platelets: 196 K/uL (ref 150.0–400.0)
RBC: 5.76 Mil/uL (ref 4.22–5.81)
RDW: 15.6 % — ABNORMAL HIGH (ref 11.5–15.5)
WBC: 5.1 K/uL (ref 4.0–10.5)

## 2025-01-04 LAB — BASIC METABOLIC PANEL WITH GFR
BUN: 15 mg/dL (ref 6–23)
CO2: 28 meq/L (ref 19–32)
Calcium: 8.7 mg/dL (ref 8.4–10.5)
Chloride: 104 meq/L (ref 96–112)
Creatinine, Ser: 1.02 mg/dL (ref 0.40–1.50)
GFR: 71.96 mL/min
Glucose, Bld: 88 mg/dL (ref 70–99)
Potassium: 4 meq/L (ref 3.5–5.1)
Sodium: 139 meq/L (ref 135–145)

## 2025-01-04 LAB — URINALYSIS, ROUTINE W REFLEX MICROSCOPIC
Bilirubin Urine: NEGATIVE
Hgb urine dipstick: NEGATIVE
Ketones, ur: NEGATIVE
Nitrite: NEGATIVE
RBC / HPF: NONE SEEN
Specific Gravity, Urine: 1.015 (ref 1.000–1.030)
Total Protein, Urine: NEGATIVE
Urine Glucose: >=1000 — AB
Urobilinogen, UA: 0.2 (ref 0.0–1.0)
pH: 6 (ref 5.0–8.0)

## 2025-01-04 LAB — MICROALBUMIN / CREATININE URINE RATIO
Creatinine,U: 93.1 mg/dL
Microalb Creat Ratio: 8.8 mg/g (ref 0.0–30.0)
Microalb, Ur: 0.8 mg/dL (ref 0.7–1.9)

## 2025-01-04 LAB — HEPATIC FUNCTION PANEL
ALT: 14 U/L (ref 3–53)
AST: 12 U/L (ref 5–37)
Albumin: 4.1 g/dL (ref 3.5–5.2)
Alkaline Phosphatase: 53 U/L (ref 39–117)
Bilirubin, Direct: 0.1 mg/dL (ref 0.1–0.3)
Total Bilirubin: 0.6 mg/dL (ref 0.2–1.2)
Total Protein: 6.9 g/dL (ref 6.0–8.3)

## 2025-01-04 LAB — LIPID PANEL
Cholesterol: 151 mg/dL (ref 28–200)
HDL: 31.6 mg/dL — ABNORMAL LOW
LDL Cholesterol: 84 mg/dL (ref 10–99)
NonHDL: 119.04
Total CHOL/HDL Ratio: 5
Triglycerides: 177 mg/dL — ABNORMAL HIGH (ref 10.0–149.0)
VLDL: 35.4 mg/dL (ref 0.0–40.0)

## 2025-01-04 LAB — HEMOGLOBIN A1C: Hgb A1c MFr Bld: 6.9 % — ABNORMAL HIGH (ref 4.6–6.5)

## 2025-01-04 LAB — FOLATE: Folate: 19.1 ng/mL

## 2025-01-04 LAB — TSH: TSH: 0.99 u[IU]/mL (ref 0.35–5.50)

## 2025-01-04 LAB — PSA: PSA: 3.67 ng/mL (ref 0.10–4.00)

## 2025-01-04 MED ORDER — COVID-19 MRNA VAC-TRIS(PFIZER) 30 MCG/0.3ML IM SUSY: 0.3000 mL | PREFILLED_SYRINGE | Freq: Once | INTRAMUSCULAR | 0 refills | Status: AC

## 2025-01-04 MED ORDER — TRAMADOL HCL 50 MG PO TABS: 50.0000 mg | ORAL_TABLET | Freq: Two times a day (BID) | ORAL | 1 refills | Status: AC | PRN

## 2025-01-04 MED ORDER — CYANOCOBALAMIN 2000 MCG PO TABS: 2000.0000 ug | ORAL_TABLET | Freq: Every day | ORAL | 1 refills | Status: AC

## 2025-01-04 NOTE — Patient Instructions (Signed)
 Health Maintenance, Male  Adopting a healthy lifestyle and getting preventive care are important in promoting health and wellness. Ask your health care provider about:  The right schedule for you to have regular tests and exams.  Things you can do on your own to prevent diseases and keep yourself healthy.  What should I know about diet, weight, and exercise?  Eat a healthy diet    Eat a diet that includes plenty of vegetables, fruits, low-fat dairy products, and lean protein.  Do not eat a lot of foods that are high in solid fats, added sugars, or sodium.  Maintain a healthy weight  Body mass index (BMI) is a measurement that can be used to identify possible weight problems. It estimates body fat based on height and weight. Your health care provider can help determine your BMI and help you achieve or maintain a healthy weight.  Get regular exercise  Get regular exercise. This is one of the most important things you can do for your health. Most adults should:  Exercise for at least 150 minutes each week. The exercise should increase your heart rate and make you sweat (moderate-intensity exercise).  Do strengthening exercises at least twice a week. This is in addition to the moderate-intensity exercise.  Spend less time sitting. Even light physical activity can be beneficial.  Watch cholesterol and blood lipids  Have your blood tested for lipids and cholesterol at 76 years of age, then have this test every 5 years.  You may need to have your cholesterol levels checked more often if:  Your lipid or cholesterol levels are high.  You are older than 76 years of age.  You are at high risk for heart disease.  What should I know about cancer screening?  Many types of cancers can be detected early and may often be prevented. Depending on your health history and family history, you may need to have cancer screening at various ages. This may include screening for:  Colorectal cancer.  Prostate cancer.  Skin cancer.  Lung  cancer.  What should I know about heart disease, diabetes, and high blood pressure?  Blood pressure and heart disease  High blood pressure causes heart disease and increases the risk of stroke. This is more likely to develop in people who have high blood pressure readings or are overweight.  Talk with your health care provider about your target blood pressure readings.  Have your blood pressure checked:  Every 3-5 years if you are 24-52 years of age.  Every year if you are 3 years old or older.  If you are between the ages of 60 and 72 and are a current or former smoker, ask your health care provider if you should have a one-time screening for abdominal aortic aneurysm (AAA).  Diabetes  Have regular diabetes screenings. This checks your fasting blood sugar level. Have the screening done:  Once every three years after age 66 if you are at a normal weight and have a low risk for diabetes.  More often and at a younger age if you are overweight or have a high risk for diabetes.  What should I know about preventing infection?  Hepatitis B  If you have a higher risk for hepatitis B, you should be screened for this virus. Talk with your health care provider to find out if you are at risk for hepatitis B infection.  Hepatitis C  Blood testing is recommended for:  Everyone born from [birth year] through 1967.  Anyone  with known risk factors for hepatitis C.  Sexually transmitted infections (STIs)  You should be screened each year for STIs, including gonorrhea and chlamydia, if:  You are sexually active and are younger than 76 years of age.  You are older than 76 years of age and your health care provider tells you that you are at risk for this type of infection.  Your sexual activity has changed since you were last screened, and you are at increased risk for chlamydia or gonorrhea. Ask your health care provider if you are at risk.  Ask your health care provider about whether you are at high risk for HIV. Your health care provider  may recommend a prescription medicine to help prevent HIV infection. If you choose to take medicine to prevent HIV, you should first get tested for HIV. You should then be tested every 3 months for as long as you are taking the medicine.  Follow these instructions at home:  Alcohol use  Do not drink alcohol if your health care provider tells you not to drink.  If you drink alcohol:  Limit how much you have to 0-2 drinks a day.  Know how much alcohol is in your drink. In the [place] one drink equals one 12 oz bottle of beer (355 mL), one 5 oz glass of wine (148 mL), or one 1 oz glass of hard liquor (44 mL).  Lifestyle  Do not use any products that contain nicotine or tobacco. These products include cigarettes, chewing tobacco, and vaping devices, such as e-cigarettes. If you need help quitting, ask your health care provider.  Do not use street drugs.  Do not share needles.  Ask your health care provider for help if you need support or information about quitting drugs.  General instructions  Schedule regular health, dental, and eye exams.  Stay current with your vaccines.  Tell your health care provider if:  You often feel depressed.  You have ever been abused or do not feel safe at home.  Summary  Adopting a healthy lifestyle and getting preventive care are important in promoting health and wellness.  Follow your health care provider's instructions about healthy diet, exercising, and getting tested or screened for diseases.  Follow your health care provider's instructions on monitoring your cholesterol and blood pressure.  This information is not intended to replace advice given to you by your health care provider. Make sure you discuss any questions you have with your health care provider.  Document Revised: 04/29/2021 Document Reviewed: 04/29/2021  Elsevier Patient Education  2024 [employer].

## 2025-01-04 NOTE — Progress Notes (Unsigned)
 "  Subjective:  Patient ID: [name], male    DOB: 1951/07/26  Age: 74 y.o. MRN: [id number]  CC: Hypertension (6 month follow up. ), Annual Exam, Hyperlipidemia, and Diabetes   HPI KRISHNA DANCEL presents for a CPX and f/up ----  Discussed the use of AI scribe software for clinical note transcription with the patient, who gave verbal consent to proceed.  History of Present Illness [name] is a 74 year old male who presents with numbness and tingling in his hands and feet.  He has been experiencing numbness and tingling in both hands, particularly noticeable at night when lying on his back with his hands across his chest. He describes a 'weird feeling' and numbness that requires him to move his hands to restore sensation. This has been occurring for a couple of months. No pain in his extremities during activity, but he notes tingling in his hands.  He has a history of nerve damage diagnosed through a nerve conduction study conducted a few years ago. He has not been told he has diabetic neuropathy. He experiences a sensation of his feet feeling like 'cardboard' and has difficulty feeling when putting on socks. He experiences pain in his leg when crossing his legs while lying down, which resolves upon uncrossing.  He underwent eye surgery last Wednesday, involving an implant and reconstructive work on his cornea. His blood sugar was noted to be 96 at that time, and he has not experienced any issues with blood sugar levels since.  He experiences a constant need to clear his throat, which occurs intermittently throughout the day. No trouble swallowing, heartburn, or indigestion, and he believes he is taking medication for acid reflux.  He mentions a history of back surgery with complications from a staph infection, which limits his ability to walk long distances or stand for extended periods due to back pain.     Outpatient Medications Prior to Visit  Medication Sig Dispense Refill    albuterol  (VENTOLIN  HFA) 108 (90 Base) MCG/ACT inhaler Inhale 2 puffs into the lungs every 6 (six) hours as needed for wheezing or shortness of breath. 8 g 0   aspirin  EC (ASPIRIN  LOW DOSE) 81 MG tablet Take 1 tablet (81 mg total) by mouth 5 (five) times daily. SWALLOW WHOLE. 90 tablet 1   atorvastatin  (LIPITOR) 80 MG tablet TAKE 1 TABLET BY MOUTH EVERY DAY AT 6 PM 90 tablet 1   carvedilol  (COREG ) 12.5 MG tablet Take 1 tablet (12.5 mg total) by mouth 2 (two) times daily. 180 tablet 2   Cholecalciferol (VITAMIN D3) 25 MCG (1000 UT) CAPS Take by mouth.     Continuous Blood Gluc Receiver (FREESTYLE LIBRE 3 READER) DEVI 1 Act by Does not apply route daily. 1 each 3   Continuous Glucose Sensor (FREESTYLE LIBRE 3 PLUS SENSOR) MISC Apply 1 Act topically every 14 (fourteen) days. Change sensor every 15 days. 6 each 1   EMBECTA PEN NEEDLE NANO 2 GEN 32G X 4 MM MISC INJECT 1 ACT INTO THE SKIN DAILY. 100 each 1   empagliflozin  (JARDIANCE ) 25 MG TABS tablet TAKE 1 TABLET (25 MG TOTAL) BY MOUTH DAILY. 90 tablet 0   Evolocumab  (REPATHA ) 140 MG/ML SOSY Inject 140 mg into the skin every 14 (fourteen) days. 6 mL 1   ferrous sulfate  325 (65 FE) MG tablet TAKE 1 TABLET BY MOUTH EVERY DAY WITH BREAKFAST 100 tablet 1   fluticasone  (FLONASE ) 50 MCG/ACT nasal spray SPRAY 2 SPRAYS INTO [facility]  NOSTRIL EVERY DAY 48 mL 1   gabapentin  (NEURONTIN ) 300 MG capsule Take 1 capsule (300 mg total) by mouth 3 (three) times daily. Take  capsule three times a day 270 capsule 1   Glucagon  1 MG/0.2ML SOAJ Inject 1 Act into the skin once as needed for up to 1 dose. 0.2 mL 3   icosapent  Ethyl (VASCEPA ) 1 g capsule Take 2 capsules (2 g total) by mouth 2 (two) times daily. 180 capsule 0   indapamide  (LOZOL ) 1.25 MG tablet Take 1.25 mg by mouth daily.     Insulin  Glargine-Lixisenatide (SOLIQUA ) 100-33 UNT-MCG/ML SOPN INJECT 60 UNITS INTO THE SKIN DAILY 45 mL 1   isosorbide  mononitrate (IMDUR ) 120 MG 24 hr tablet Take 1 tablet (120 mg total) by  mouth daily. 90 tablet 3   levocetirizine (XYZAL ) 5 MG tablet TAKE 1 TABLET BY MOUTH EVERY DAY IN THE EVENING 90 tablet 1   metFORMIN  (GLUCOPHAGE ) 500 MG tablet TAKE 1 TABLET BY MOUTH TWICE A DAY WITH FOOD 180 tablet 1   nitroGLYCERIN  (NITROSTAT ) 0.4 MG SL tablet PLACE 1 TABLET (0.4 MG TOTAL) UNDER THE TONGUE EVERY 5 (FIVE) MINUTES AS NEEDED FOR CHEST PAIN. DO NOT EXCEED TOTAL OF 3 DOSE IN 15 MINUTES. 75 tablet 0   pantoprazole  (PROTONIX ) 40 MG tablet TAKE 1 TABLET BY MOUTH TWICE A DAY 180 tablet 1   ranolazine  (RANEXA ) 500 MG 12 hr tablet Take 1 tablet (500 mg total) by mouth 2 (two) times daily. 180 tablet 1   Spacer/Aero-Holding Chambers DEVI 1 each by Does not apply route as needed. 1 each 0   tamsulosin  (FLOMAX ) 0.4 MG CAPS capsule Take 0.4 mg by mouth once.     promethazine -dextromethorphan (PROMETHAZINE -DM) 6.25-15 MG/5ML syrup Take 5 mLs by mouth 3 (three) times daily as needed for cough. 118 mL 0   traMADol  (ULTRAM -ER) 100 MG 24 hr tablet Take 1 tablet (100 mg total) by mouth daily. 90 tablet 1   No facility-administered medications prior to visit.    ROS Review of Systems  Constitutional:  Positive for fatigue. Negative for appetite change, chills, diaphoresis and fever.  HENT: Negative.    Respiratory:  Positive for apnea. Negative for cough, shortness of breath and wheezing.   Cardiovascular:  Negative for chest pain, palpitations and leg swelling.  Gastrointestinal:  Negative for abdominal pain, constipation, diarrhea, nausea and vomiting.  Genitourinary: Negative.  Negative for difficulty urinating.  Musculoskeletal:  Positive for arthralgias and back pain. Negative for joint swelling.  Skin: Negative.   Neurological: Negative.  Negative for dizziness and weakness.  Hematological:  Negative for adenopathy. Does not bruise/bleed easily.  Psychiatric/Behavioral: Negative.      Objective:  BP 132/60 (BP Location: Left Arm, Patient Position: Sitting, Cuff Size: Normal)    Pulse 68   Temp 98.4 F (36.9 C) (Oral)   Ht 5' 11 (1.803 m)   Wt 210 lb 12.8 oz (95.6 kg)   SpO2 94%   BMI 29.40 kg/m   BP Readings from Last 3 Encounters:  01/04/25 132/60  07/27/24 120/72  07/04/24 (!) 140/82    Wt Readings from Last 3 Encounters:  01/04/25 210 lb 12.8 oz (95.6 kg)  07/27/24 212 lb (96.2 kg)  07/04/24 214 lb 3.2 oz (97.2 kg)    Physical Exam Vitals reviewed.  Constitutional:      Appearance: Normal appearance.  HENT:     Mouth/Throat:     Mouth: Mucous membranes are moist.  Eyes:     General:  No scleral icterus.    Conjunctiva/sclera: Conjunctivae normal.  Cardiovascular:     Rate and Rhythm: Normal rate and regular rhythm.     Pulses:          Carotid pulses are 1+ on the right side and 1+ on the left side.      Radial pulses are 1+ on the right side and 1+ on the left side.     Heart sounds: Normal heart sounds and S1 normal. No murmur heard.    Comments: EKG- SR with 1st degree AV block, 72 bpm LAD Low voltage Anterior infarct pattern Unchanged  Pulmonary:     Breath sounds: No stridor. No wheezing, rhonchi or rales.  Abdominal:     General: Abdomen is protuberant. Bowel sounds are normal. There is no distension.     Palpations: Abdomen is soft. There is no hepatomegaly, splenomegaly or mass.     Tenderness: There is no abdominal tenderness. There is no guarding.     Hernia: No hernia is present.  Musculoskeletal:     Cervical back: Neck supple.     Right lower leg: No edema.     Left lower leg: No edema.  Skin:    General: Skin is warm and dry.     Findings: No lesion or rash.  Neurological:     General: No focal deficit present.     Mental Status: He is alert. Mental status is at baseline.     Lab Results  Component Value Date   WBC 5.1 01/04/2025   HGB 13.4 01/04/2025   HCT 41.8 01/04/2025   PLT 196.0 01/04/2025   GLUCOSE 88 01/04/2025   CHOL 151 01/04/2025   TRIG 177.0 (H) 01/04/2025   HDL 31.60 (L) 01/04/2025    LDLDIRECT 82.7 04/04/2013   LDLCALC 84 01/04/2025   ALT 14 01/04/2025   AST 12 01/04/2025   NA 139 01/04/2025   K 4.0 01/04/2025   CL 104 01/04/2025   CREATININE 1.02 01/04/2025   BUN 15 01/04/2025   CO2 28 01/04/2025   TSH 0.99 01/04/2025   PSA 3.67 01/04/2025   INR 0.95 08/17/2010   HGBA1C 6.9 (H) 01/04/2025   MICROALBUR 0.8 01/04/2025    US  Soft Tissue Head/Neck (NON-THYROID ) Result Date: 07/30/2024 CLINICAL DATA:  Palpable abnormality at the right medial clavicular head EXAM: ULTRASOUND OF HEAD/NECK SOFT TISSUES TECHNIQUE: Ultrasound examination of the head and neck soft tissues was performed in the area of clinical concern. COMPARISON:  None Available. FINDINGS: Sonographic interrogation region of clinical concern demonstrates hypertrophy of the right sternoclavicular junction. No evidence of lymphadenopathy or other abnormality. IMPRESSION: The palpable abnormality corresponds with the right sternoclavicular junction. The finding very likely represents hypertrophy of the cartilaginous interface due to underlying degenerative changes. An unlikely alternative would be a cartilaginous or osseous malignancy. Recommend continued clinical surveillance. If there is evidence of enlargement over time, then further evaluation with gadolinium-enhanced MRI may become warranted. Electronically Signed   By: Wilkie Lent M.D.   On: 07/30/2024 08:33    Assessment & Plan:  Insulin -requiring or dependent type II diabetes mellitus (HCC) -     COVID-19 mRNA Vac-TriS(Pfizer); Inject 0.3 mLs into the muscle once for 1 dose.  Dispense: 0.3 mL; Refill: 0 -     Microalbumin / creatinine urine ratio; Future -     Hemoglobin A1c; Future -     Basic metabolic panel with GFR; Future -     HM Diabetes Foot Exam  Need for  immunization against influenza -     Flu vaccine HIGH DOSE PF(Fluzone Trivalent)  Hypertension, unspecified type -     EKG 12-Lead -     Urinalysis, Routine w reflex microscopic;  Future  Vitamin B12 deficiency anemia due to intrinsic factor deficiency -     CBC with Differential/Platelet; Future -     Folate; Future -     Vitamin B12; Future -     Cyanocobalamin ; Take 1 tablet (2,000 mcg total) by mouth daily.  Dispense: 90 tablet; Refill: 1  Benign prostatic hyperplasia with weak urinary stream -     PSA; Future -     Urinalysis, Routine w reflex microscopic; Future  Dyslipidemia, goal LDL below 70 -     Lipid panel; Future -     TSH; Future -     Hepatic function panel; Future  Other diabetic neurological complication associated with diabetes mellitus due to underlying condition (HCC)  Chronic diastolic heart failure (HCC)  Diabetic macular edema of right eye (HCC)  OSA on CPAP  CKD (chronic kidney disease) stage 2, GFR 60-89 ml/min -     Microalbumin / creatinine urine ratio; Future -     Urinalysis, Routine w reflex microscopic; Future -     Basic metabolic panel with GFR; Future  Neurogenic claudication due to lumbar spinal stenosis -     traMADol  HCl; Take 1 tablet (50 mg total) by mouth every 12 (twelve) hours as needed.  Dispense: 180 tablet; Refill: 1  Spinal stenosis, lumbar region, with neurogenic claudication -     traMADol  HCl; Take 1 tablet (50 mg total) by mouth every 12 (twelve) hours as needed.  Dispense: 180 tablet; Refill: 1  Lumbar radiculopathy -     traMADol  HCl; Take 1 tablet (50 mg total) by mouth every 12 (twelve) hours as needed.  Dispense: 180 tablet; Refill: 1  Postlaminectomy syndrome, lumbar region -     traMADol  HCl; Take 1 tablet (50 mg total) by mouth every 12 (twelve) hours as needed.  Dispense: 180 tablet; Refill: 1  Encounter for general adult medical examination with abnormal findings  Primary osteoarthritis involving multiple joints -     traMADol  HCl; Take 1 tablet (50 mg total) by mouth every 12 (twelve) hours as needed.  Dispense: 180 tablet; Refill: 1  Need for prophylactic vaccination and inoculation  against varicella     Follow-up: Return in about 6 months (around 07/04/2025).  Debby Molt, MD "

## 2025-01-05 ENCOUNTER — Encounter: Payer: Self-pay | Admitting: Internal Medicine

## 2025-01-05 ENCOUNTER — Ambulatory Visit: Payer: Self-pay | Admitting: Internal Medicine

## 2025-01-06 DIAGNOSIS — B3749 Other urogenital candidiasis: Secondary | ICD-10-CM | POA: Insufficient documentation

## 2025-01-09 MED ORDER — FLUCONAZOLE 150 MG PO TABS: 150.0000 mg | ORAL_TABLET | Freq: Once | ORAL | 0 refills | Status: AC

## 2025-01-10 NOTE — Telephone Encounter (Signed)
 Rx was sent to treat the yeast infection Take meds for neuropathy

## 2025-01-11 NOTE — Telephone Encounter (Signed)
Tramadol and gabapentin

## 2025-01-25 ENCOUNTER — Ambulatory Visit: Admitting: Podiatry

## 2025-01-31 ENCOUNTER — Ambulatory Visit: Admitting: Podiatry

## 2025-03-09 ENCOUNTER — Telehealth: Admitting: Adult Health

## 2025-06-22 ENCOUNTER — Ambulatory Visit
